# Patient Record
Sex: Female | Born: 1955
Health system: Southern US, Community
[De-identification: ages and names within clinical notes are randomized; demographics above are authoritative.]

## PROBLEM LIST (undated history)

## (undated) DIAGNOSIS — M1990 Unspecified osteoarthritis, unspecified site: Secondary | ICD-10-CM

## (undated) DIAGNOSIS — G8929 Other chronic pain: Secondary | ICD-10-CM

## (undated) DIAGNOSIS — R55 Syncope and collapse: Secondary | ICD-10-CM

## (undated) DIAGNOSIS — E785 Hyperlipidemia, unspecified: Secondary | ICD-10-CM

## (undated) DIAGNOSIS — E039 Hypothyroidism, unspecified: Secondary | ICD-10-CM

## (undated) DIAGNOSIS — I2510 Atherosclerotic heart disease of native coronary artery without angina pectoris: Secondary | ICD-10-CM

## (undated) DIAGNOSIS — F419 Anxiety disorder, unspecified: Secondary | ICD-10-CM

## (undated) DIAGNOSIS — I10 Essential (primary) hypertension: Secondary | ICD-10-CM

## (undated) DIAGNOSIS — R0600 Dyspnea, unspecified: Secondary | ICD-10-CM

## (undated) DIAGNOSIS — Z923 Personal history of irradiation: Secondary | ICD-10-CM

## (undated) DIAGNOSIS — F32A Depression, unspecified: Secondary | ICD-10-CM

## (undated) DIAGNOSIS — C801 Malignant (primary) neoplasm, unspecified: Secondary | ICD-10-CM

## (undated) DIAGNOSIS — M349 Systemic sclerosis, unspecified: Secondary | ICD-10-CM

## (undated) DIAGNOSIS — J302 Other seasonal allergic rhinitis: Secondary | ICD-10-CM

## (undated) DIAGNOSIS — M549 Dorsalgia, unspecified: Secondary | ICD-10-CM

## (undated) DIAGNOSIS — Z720 Tobacco use: Secondary | ICD-10-CM

## (undated) DIAGNOSIS — K219 Gastro-esophageal reflux disease without esophagitis: Secondary | ICD-10-CM

## (undated) DIAGNOSIS — J984 Other disorders of lung: Secondary | ICD-10-CM

## (undated) DIAGNOSIS — F329 Major depressive disorder, single episode, unspecified: Secondary | ICD-10-CM

## (undated) HISTORY — DX: Unspecified osteoarthritis, unspecified site: M19.90

## (undated) HISTORY — DX: Gastro-esophageal reflux disease without esophagitis: K21.9

## (undated) HISTORY — DX: Tobacco use: Z72.0

## (undated) HISTORY — DX: Atherosclerotic heart disease of native coronary artery without angina pectoris: I25.10

## (undated) HISTORY — DX: Systemic sclerosis, unspecified: M34.9

## (undated) HISTORY — DX: Other seasonal allergic rhinitis: J30.2

## (undated) HISTORY — DX: Other chronic pain: G89.29

## (undated) HISTORY — DX: Other disorders of lung: J98.4

## (undated) HISTORY — DX: Dorsalgia, unspecified: M54.9

## (undated) HISTORY — DX: Syncope and collapse: R55

## (undated) HISTORY — DX: Hypothyroidism, unspecified: E03.9

## (undated) HISTORY — PX: TUBAL LIGATION: SHX77

---

## 2000-08-24 ENCOUNTER — Encounter: Payer: Self-pay | Admitting: Family Medicine

## 2000-08-24 ENCOUNTER — Ambulatory Visit (HOSPITAL_COMMUNITY): Admission: RE | Admit: 2000-08-24 | Discharge: 2000-08-24 | Payer: Self-pay | Admitting: Family Medicine

## 2000-09-15 ENCOUNTER — Ambulatory Visit (HOSPITAL_COMMUNITY): Admission: RE | Admit: 2000-09-15 | Discharge: 2000-09-15 | Payer: Self-pay | Admitting: Unknown Physician Specialty

## 2000-09-15 ENCOUNTER — Encounter: Payer: Self-pay | Admitting: Unknown Physician Specialty

## 2002-05-15 ENCOUNTER — Ambulatory Visit (HOSPITAL_COMMUNITY): Admission: RE | Admit: 2002-05-15 | Discharge: 2002-05-15 | Payer: Self-pay | Admitting: Emergency Medicine

## 2002-09-24 ENCOUNTER — Ambulatory Visit (HOSPITAL_COMMUNITY): Admission: RE | Admit: 2002-09-24 | Discharge: 2002-09-24 | Payer: Self-pay | Admitting: Unknown Physician Specialty

## 2006-07-17 ENCOUNTER — Ambulatory Visit (HOSPITAL_COMMUNITY): Admission: RE | Admit: 2006-07-17 | Discharge: 2006-07-17 | Payer: Self-pay | Admitting: Family Medicine

## 2007-07-12 ENCOUNTER — Ambulatory Visit (HOSPITAL_COMMUNITY): Admission: RE | Admit: 2007-07-12 | Discharge: 2007-07-12 | Payer: Self-pay | Admitting: Rheumatology

## 2007-07-26 ENCOUNTER — Ambulatory Visit (HOSPITAL_COMMUNITY): Admission: RE | Admit: 2007-07-26 | Discharge: 2007-07-26 | Payer: Self-pay | Admitting: Family Medicine

## 2008-08-20 ENCOUNTER — Ambulatory Visit (HOSPITAL_COMMUNITY): Admission: RE | Admit: 2008-08-20 | Discharge: 2008-08-20 | Payer: Self-pay | Admitting: Family Medicine

## 2008-11-06 ENCOUNTER — Emergency Department (HOSPITAL_COMMUNITY): Admission: EM | Admit: 2008-11-06 | Discharge: 2008-11-06 | Payer: Self-pay | Admitting: Emergency Medicine

## 2009-03-04 ENCOUNTER — Ambulatory Visit: Payer: Self-pay | Admitting: Family Medicine

## 2009-03-04 ENCOUNTER — Encounter: Payer: Self-pay | Admitting: Physician Assistant

## 2009-03-04 DIAGNOSIS — J309 Allergic rhinitis, unspecified: Secondary | ICD-10-CM | POA: Insufficient documentation

## 2009-03-04 DIAGNOSIS — R1011 Right upper quadrant pain: Secondary | ICD-10-CM

## 2009-03-04 DIAGNOSIS — G4700 Insomnia, unspecified: Secondary | ICD-10-CM | POA: Insufficient documentation

## 2009-03-09 ENCOUNTER — Encounter: Payer: Self-pay | Admitting: Physician Assistant

## 2009-03-18 ENCOUNTER — Ambulatory Visit: Payer: Self-pay | Admitting: Family Medicine

## 2009-03-18 DIAGNOSIS — R490 Dysphonia: Secondary | ICD-10-CM

## 2009-03-18 DIAGNOSIS — E559 Vitamin D deficiency, unspecified: Secondary | ICD-10-CM

## 2009-03-18 DIAGNOSIS — L600 Ingrowing nail: Secondary | ICD-10-CM | POA: Insufficient documentation

## 2009-03-18 DIAGNOSIS — B351 Tinea unguium: Secondary | ICD-10-CM | POA: Insufficient documentation

## 2009-03-18 DIAGNOSIS — E785 Hyperlipidemia, unspecified: Secondary | ICD-10-CM | POA: Insufficient documentation

## 2009-03-24 ENCOUNTER — Ambulatory Visit (HOSPITAL_COMMUNITY): Admission: RE | Admit: 2009-03-24 | Discharge: 2009-03-24 | Payer: Self-pay | Admitting: Family Medicine

## 2009-03-25 LAB — CONVERTED CEMR LAB
ALT: 38 units/L — ABNORMAL HIGH (ref 0–35)
Albumin: 4.6 g/dL (ref 3.5–5.2)
Alkaline Phosphatase: 103 units/L (ref 39–117)
Basophils Absolute: 0 10*3/uL (ref 0.0–0.1)
CO2: 22 meq/L (ref 19–32)
Chloride: 104 meq/L (ref 96–112)
Creatinine, Ser: 0.76 mg/dL (ref 0.40–1.20)
Eosinophils Absolute: 0.2 10*3/uL (ref 0.0–0.7)
Eosinophils Relative: 2 % (ref 0–5)
Glucose, Bld: 98 mg/dL (ref 70–99)
Hemoglobin: 12.9 g/dL (ref 12.0–15.0)
Hepatitis B Surface Ag: NEGATIVE
MCHC: 32.1 g/dL (ref 30.0–36.0)
MCV: 79.4 fL (ref 78.0–100.0)
Monocytes Relative: 8 % (ref 3–12)
Neutrophils Relative %: 41 % — ABNORMAL LOW (ref 43–77)
Platelets: 280 10*3/uL (ref 150–400)
Potassium: 4 meq/L (ref 3.5–5.3)
RBC: 5.06 M/uL (ref 3.87–5.11)
Sodium: 140 meq/L (ref 135–145)
Total CHOL/HDL Ratio: 5
Total Protein: 7.7 g/dL (ref 6.0–8.3)
VLDL: 62 mg/dL — ABNORMAL HIGH (ref 0–40)

## 2009-04-02 ENCOUNTER — Encounter: Payer: Self-pay | Admitting: Family Medicine

## 2009-06-24 ENCOUNTER — Ambulatory Visit: Payer: Self-pay | Admitting: Family Medicine

## 2009-06-24 ENCOUNTER — Telehealth: Payer: Self-pay | Admitting: Family Medicine

## 2009-06-24 ENCOUNTER — Other Ambulatory Visit: Admission: RE | Admit: 2009-06-24 | Discharge: 2009-06-24 | Payer: Self-pay | Admitting: Family Medicine

## 2009-06-24 ENCOUNTER — Encounter: Payer: Self-pay | Admitting: Physician Assistant

## 2009-06-24 DIAGNOSIS — M549 Dorsalgia, unspecified: Secondary | ICD-10-CM

## 2009-06-24 DIAGNOSIS — K921 Melena: Secondary | ICD-10-CM | POA: Insufficient documentation

## 2009-06-24 DIAGNOSIS — G8929 Other chronic pain: Secondary | ICD-10-CM

## 2009-06-24 DIAGNOSIS — F329 Major depressive disorder, single episode, unspecified: Secondary | ICD-10-CM

## 2009-06-24 DIAGNOSIS — Z720 Tobacco use: Secondary | ICD-10-CM

## 2009-06-24 DIAGNOSIS — F419 Anxiety disorder, unspecified: Secondary | ICD-10-CM | POA: Insufficient documentation

## 2009-06-24 HISTORY — DX: Tobacco use: Z72.0

## 2009-06-24 LAB — CONVERTED CEMR LAB: OCCULT 1: POSITIVE

## 2009-07-02 ENCOUNTER — Encounter: Payer: Self-pay | Admitting: Physician Assistant

## 2009-07-02 ENCOUNTER — Ambulatory Visit (HOSPITAL_COMMUNITY): Admission: RE | Admit: 2009-07-02 | Discharge: 2009-07-02 | Payer: Self-pay | Admitting: Family Medicine

## 2009-07-03 ENCOUNTER — Telehealth: Payer: Self-pay | Admitting: Physician Assistant

## 2009-07-16 LAB — CONVERTED CEMR LAB
Cholesterol: 152 mg/dL (ref 0–200)
HDL: 45 mg/dL (ref >39)
Triglycerides: 315 mg/dL — ABNORMAL HIGH (ref <150)
VLDL: 63 mg/dL — ABNORMAL HIGH (ref 0–40)

## 2009-07-21 ENCOUNTER — Ambulatory Visit: Payer: Self-pay | Admitting: Internal Medicine

## 2009-07-21 DIAGNOSIS — R197 Diarrhea, unspecified: Secondary | ICD-10-CM

## 2009-08-05 ENCOUNTER — Emergency Department (HOSPITAL_COMMUNITY): Admission: EM | Admit: 2009-08-05 | Discharge: 2009-08-05 | Payer: Self-pay | Admitting: Emergency Medicine

## 2009-08-27 ENCOUNTER — Ambulatory Visit (HOSPITAL_COMMUNITY): Admission: RE | Admit: 2009-08-27 | Discharge: 2009-08-27 | Payer: Self-pay | Admitting: Family Medicine

## 2009-09-15 ENCOUNTER — Encounter: Payer: Self-pay | Admitting: Gastroenterology

## 2009-09-28 ENCOUNTER — Ambulatory Visit (HOSPITAL_COMMUNITY): Admission: RE | Admit: 2009-09-28 | Discharge: 2009-09-28 | Payer: Self-pay | Admitting: Internal Medicine

## 2009-09-28 ENCOUNTER — Telehealth (INDEPENDENT_AMBULATORY_CARE_PROVIDER_SITE_OTHER): Payer: Self-pay

## 2009-09-28 ENCOUNTER — Ambulatory Visit: Payer: Self-pay | Admitting: Internal Medicine

## 2009-09-28 HISTORY — PX: COLONOSCOPY: SHX174

## 2009-09-30 ENCOUNTER — Encounter: Payer: Self-pay | Admitting: Internal Medicine

## 2009-10-07 HISTORY — PX: ESOPHAGOGASTRODUODENOSCOPY: SHX1529

## 2009-10-13 ENCOUNTER — Encounter: Payer: Self-pay | Admitting: Physician Assistant

## 2009-10-13 ENCOUNTER — Ambulatory Visit: Payer: Self-pay | Admitting: Family Medicine

## 2009-10-13 DIAGNOSIS — R42 Dizziness and giddiness: Secondary | ICD-10-CM | POA: Insufficient documentation

## 2009-10-13 DIAGNOSIS — M25519 Pain in unspecified shoulder: Secondary | ICD-10-CM | POA: Insufficient documentation

## 2009-10-13 LAB — CONVERTED CEMR LAB
Free T4: 1.22 ng/dL (ref 0.80–1.80)
TSH: 1.672 microintl units/mL (ref 0.350–4.500)

## 2009-10-14 ENCOUNTER — Encounter: Payer: Self-pay | Admitting: Physician Assistant

## 2009-10-15 ENCOUNTER — Telehealth: Payer: Self-pay | Admitting: Family Medicine

## 2009-10-19 ENCOUNTER — Telehealth: Payer: Self-pay | Admitting: Family Medicine

## 2009-11-02 ENCOUNTER — Ambulatory Visit: Payer: Self-pay | Admitting: Orthopedic Surgery

## 2009-11-02 DIAGNOSIS — M7580 Other shoulder lesions, unspecified shoulder: Secondary | ICD-10-CM

## 2009-11-04 ENCOUNTER — Encounter: Payer: Self-pay | Admitting: Orthopedic Surgery

## 2010-01-14 ENCOUNTER — Ambulatory Visit: Payer: Self-pay | Admitting: Family Medicine

## 2010-01-19 ENCOUNTER — Telehealth (INDEPENDENT_AMBULATORY_CARE_PROVIDER_SITE_OTHER): Payer: Self-pay

## 2010-02-16 NOTE — Letter (Signed)
Summary: History form  History form   Imported By: Jacklynn Ganong 11/04/2009 08:37:15  _____________________________________________________________________  External Attachment:    Type:   Image     Comment:   External Document

## 2010-02-16 NOTE — Assessment & Plan Note (Signed)
Summary: left shoulder pain needs xr/sec hor/sampson/bsf   Vital Signs:  Patient profile:   55 year old female Height:      69 inches Weight:      152 pounds Pulse rate:   82 / minute Resp:     16 per minute  Vitals Entered By: Fuller Canada MD (November 02, 2009 11:58 AM)  Visit Type:  Follow-up Referring Provider:  Lodema Hong Primary Provider:  Lodema Hong  CC:  left shoulder pain.  History of Present Illness: I saw Kelly Douglas in the office today for an initial visit.    She is a 55 years old woman with the complaint of:  left shoulder pain.  No injury.  Xrays today.  Meds: EMR, pt did not list any.  This patient presents to [place] with primarily LEFT shoulder pain and difficulty lifting her arm overhead and difficulty with activities of daily living such as combing her hair.  She also complains of numbness and tingling in her lower extremities and RIGHT upper extremity.  She has been started on gabapentin, wrist brace which she did herself and an anti-inflammatory.  She has some weakness in her LEFT shoulder.  Pain is moderate and occasionally severe.  No trauma.      Allergies (verified): No Known Drug Allergies  Past History:  Past Medical History: Allergic rhinitis Chronic Bronchitis Arthritis Hypothyroidism Syncope - vasovagal per hx Scleroderma GERD Chronic back pain COPD Numbness  Family History: Mother deceased- HTN Father deceased- HTN, stroke 8 brothers- one with downs syndrome, several HTN, thyroid, DM, heart attack 1 sister- HTN, obesity No FH CRC, liver, chronic GI illnesses. One brother had throat cancer FH of Cancer:  Family History of Diabetes Family History Coronary Heart Disease female < 12 Family History of Arthritis  Social History: Disabled. married 22 years 2 grown children Current Smoker half pack a day, trying to quit Alcohol use-yes, one 12 ounce beer most days 3 cups of coffee daily Drug use-no 12th grade ed.  Review of  Systems Constitutional:  Denies weight loss, weight gain, fever, chills, and fatigue. Cardiovascular:  Complains of fainting; denies chest pain, palpitations, and murmurs. Respiratory:  Complains of short of breath; denies wheezing, couch, tightness, pain on inspiration, and snoring . Gastrointestinal:  Complains of heartburn; denies nausea, vomiting, diarrhea, constipation, and blood in your stools. Genitourinary:  Denies frequency, urgency, difficulty urinating, painful urination, flank pain, and bleeding in urine. Neurologic:  Complains of dizziness; denies numbness, tingling, unsteady gait, tremors, and seizure. Musculoskeletal:  Complains of joint pain; denies swelling, instability, stiffness, redness, heat, and muscle pain. Endocrine:  Complains of heat or cold intolerance; denies excessive thirst and exessive urination. Psychiatric:  Complains of nervousness and anxiety; denies depression and hallucinations. Skin:  Complains of changes in the skin and itching; denies poor healing, rash, and redness. HEENT:  Denies blurred or double vision, eye pain, redness, and watering. Immunology:  Complains of seasonal allergies; denies sinus problems and allergic to bee stings. Hemoatologic:  Denies easy bleeding and brusing.  Physical Exam  Additional Exam:  GEN: well developed, well nourished, normal grooming and hygiene, no deformity and smalll body habitus.  VS normal  CDV: pulses are normal, no edema, no erythema. no tenderness  Lymph: normal lymph nodes   Skin: no rashes,  or open sores. there is old there are multiple areas of skin changes which she relates to her arthritis.  NEURO: normal coordination, reflexes, sensation.   Psyche: awake, alert and oriented. Mood normal   cervical  spine mildly tender in the midline at C5 and 6.  RIGHT shoulder inspection normal, range of motion full, motor exam grade 5, stability normal.  Impingement sign negative  LEFT shoulder inspection  tenderness in the posterior subacromial space, range of motion passively full, strength normal grade 5.  Stability normal.  Impingement sign positive.  Adduction across the body normal.  A.c. joint nontender.     Impression & Recommendations:  Problem # 1:  SHOULDER IMPINGEMENT SYNDROME, LEFT (ICD-726.2) Assessment New  x-rays LEFT shoulder, AP and lateral. The glenohumeral joint is aligned normally. The medial contour of the humerus and lateral border of the scapula, have a nice, smooth line, and the acromion is type II. There no space-occupying lesions in the lung field on the LEFT.  Impression normal LEFT shoulder.  LEFT subacromial injection Verbal consent obtained/The shoulder was injected with depomedrol 40mg /cc and sensorcaine .25% . There were no complications  Orders: New Patient Level III (81191) Shoulder x-ray,  minimum 2 views (47829) Joint Aspirate / Injection, Large (20610) Depo- Medrol 40mg  (J1030)  Patient Instructions: 1)  You have received an injection of cortisone today. You may experience increased pain at the injection site. Apply ice pack to the area for 20 minutes every 2 hours and take 2 xtra strength tylenol every 8 hours. This increased pain will usually resolve in 24 hours. The injection will take effect in 3-10 days.  2)  Limit activity to comfort and avoid activities that increase discomfort.  Apply moist heat and/or ice to shoulder.  Please read the Shoulder Pain Handout and start exercises as directed. 3)  Please schedule a follow-up appointment as needed.   Orders Added: 1)  New Patient Level III [56213] 2)  Shoulder x-ray,  minimum 2 views [73030] 3)  Joint Aspirate / Injection, Large [20610] 4)  Depo- Medrol 40mg  [J1030]

## 2010-02-16 NOTE — Letter (Signed)
Summary: triage order  triage order   Imported By: Ave Filter 09/15/2009 11:14:24  _____________________________________________________________________  External Attachment:    Type:   Image     Comment:   External Document  Appended Document: triage order Fine as is. Just make sure order is for TCS +/- EGD (NOT TCS/EGD).  Appended Document: triage order Correction made.

## 2010-02-16 NOTE — Progress Notes (Signed)
Summary: mammo at [facility]  Phone Note Call from Patient   Summary of Call: pt has appt at [facility] for mammo on 08/24/2009 9:00. pt notified  Initial call taken by: Rudene Anda,  June 24, 2009 10:14 AM

## 2010-02-16 NOTE — Progress Notes (Signed)
  Phone Note Outgoing Call   Summary of Call: Pls notify pt that I have recieved her breathing test results.  She has COPD and this is why she has coughing, difficulty breathing and wheezing.  I have prescribed an inhaler for her to use once daily called [employer]. She needs to ask the pharmacist to show her how to use it.  It is very important she stop smoking to prevent this from worsening. Initial call taken by: Esperanza Sheets PA,  July 03, 2009 10:16 AM  Follow-up for Phone Call        called patient, no answer Follow-up by: Adella Hare LPN,  July 03, 2009 3:25 PM  Additional Follow-up for Phone Call Additional follow up Details #1::        patient aware and will mail some info on copd per patient request Additional Follow-up by: Adella Hare LPN,  July 06, 2009 10:42 AM    New/Updated Medications: SPIRIVA HANDIHALER 18 MCG CAPS (TIOTROPIUM BROMIDE MONOHYDRATE) use 1 inhalation once daily Prescriptions: SPIRIVA HANDIHALER 18 MCG CAPS (TIOTROPIUM BROMIDE MONOHYDRATE) use 1 inhalation once daily  #1 mos x 2   Entered and Authorized by:   Esperanza Sheets PA   Signed by:   Esperanza Sheets PA on 07/03/2009   Method used:   Electronically to        [facility] [phone]* (retail)       [address], [place]  [address]       Ph: [phone]       Fax: [phone]   RxID:   [id number]

## 2010-02-16 NOTE — Assessment & Plan Note (Signed)
Summary: lightheaded spells- room 1   Vital Signs:  Patient profile:   55 year old female Height:      67 inches Weight:      150.75 pounds BMI:     23.70 O2 Sat:      100 % on Room air Pulse (ortho):   85 / minute Resp:     16 per minute BP standing:   122 / 80  Vitals Entered By: Adella Hare LPN (October 13, 2009 9:28 AM) CC: lightheaded with pain in head upon standing Is Patient Diabetic? No Pain Assessment Patient in pain? no        Serial Vital Signs/Assessments:  Time      Position  BP       Pulse  Resp  Temp     By           Lying LA  138/82   83                    Adella Hare LPN           Sitting   130/90   84                    Adella Hare LPN           Standing  122/80   85                    Adella Hare LPN   Referring Provider:  Lodema Hong Primary Provider:  Lodema Hong  CC:  lightheaded with pain in head upon standing.  History of Present Illness: Pt states that her girlfriends dad passed away and she is feeling very stressed.  She is having episodes of feeling very anxious and stressed, lightheaded with change of positions, stomach upset.  She would like a prescription to help her through this stressful time.  Had used Xanax in the past, with previous physician.  Pt states she feels tired when she takes her thyroid pills every day, but if she stops them for a week she then feels better. Has been taking them every day x 2 mos.  Last thyroid labs done 2/11and were wnl.  Back pain is doing much better.  Had injections with Dr Eduard Clos and has helped alot.  Needs a refill of Neurontin.  Lt shoulder pain x > 1 yr.  No injury.  Current Medications (verified): 1)  Neurontin 300 Mg Caps (Gabapentin) .... One Cap By Mouth Two Times A Day 2)  Pantoprazole Sodium 40 Mg Tbec (Pantoprazole Sodium) .... One Tab By Mouth Once Daily 3)  Levothyroxine Sodium 75 Mcg Tabs (Levothyroxine Sodium) .... One Tab By Mouth Once Daily 4)  Ambien 10 Mg Tabs (Zolpidem Tartrate) .Marland Kitchen..  1 At Bedtime As Needed For Insomnia 5)  Multivitamins  Tabs (Multiple Vitamin) .... Take 1 Tablet By Mouth Once A Day 6)  Sertraline Hcl 100 Mg Tabs (Sertraline Hcl) .... Take 1/2 Tablet For 10 Days Then Increase To 1 Tablet Daily 7)  Spiriva Handihaler 18 Mcg Caps (Tiotropium Bromide Monohydrate) .... Use 1 Inhalation Once Daily 8)  Vitamin D .... One Tablet Daily  Allergies (verified): No Known Drug Allergies  Past History:  Past medical history reviewed for relevance to current acute and chronic problems.  Past Medical History: Reviewed history from 07/21/2009 and no changes required. Allergic rhinitis Chronic Bronchitis Arthritis Hypothyroidism Syncope - vasovagal per hx Scleroderma GERD Chronic back pain  Review of Systems General:  Denies chills and fever. ENT:  Denies earache, nasal congestion, and sore throat. CV:  Complains of lightheadness; denies chest pain or discomfort and palpitations. Resp:  Denies cough and shortness of breath. GI:  Complains of loss of appetite and nausea; denies abdominal pain, change in bowel habits, and vomiting. Neuro:  Denies headaches. Psych:  Complains of anxiety and easily tearful; denies depression.  Physical Exam  General:  Well-developed,well-nourished,in no acute distress; alert,appropriate and cooperative throughout examination Head:  Normocephalic and atraumatic without obvious abnormalities. No apparent alopecia or balding. Ears:  External ear exam shows no significant lesions or deformities.  Otoscopic examination reveals clear canals, tympanic membranes are intact bilaterally without bulging, retraction, inflammation or discharge. Hearing is grossly normal bilaterally. Nose:  External nasal examination shows no deformity or inflammation. Nasal mucosa are pink and moist without lesions or exudates. Mouth:  Oral mucosa and oropharynx without lesions or exudates.  poor dentition and teeth missing.   Neck:  No deformities, masses,  or tenderness noted. Lungs:  Normal respiratory effort, chest expands symmetrically. Lungs are clear to auscultation, no crackles or wheezes. Heart:  Normal rate and regular rhythm. S1 and S2 normal without gallop, murmur, click, rub or other extra sounds. Msk:  LT shoulder:  normal ROM, no joint swelling, no joint instability, and no crepitation.  TTP AC joint and posterior joint line of shoulder. Pulses:  R radial normal and L radial normal.   Neurologic:  alert & oriented X3, sensation intact to light touch, gait normal, and DTRs symmetrical and normal.   Cervical Nodes:  No lymphadenopathy noted Psych:  Cognition and judgment appear intact. Alert and cooperative with normal attention span and concentration. No apparent delusions, illusions, hallucinations   Impression & Recommendations:  Problem # 1:  SHOULDER PAIN, LEFT (ICD-719.41) Assessment New  Orders: Orthopedic Referral (Ortho)  Problem # 2:  BACK PAIN, LUMBAR, CHRONIC (ICD-724.2) Assessment: Improved  Problem # 3:  ANXIETY DISORDER (ICD-300.00) Assessment: Deteriorated  Her updated medication list for this problem includes:    Sertraline Hcl 100 Mg Tabs (Sertraline hcl) .Marland Kitchen... Take 1/2 tablet for 10 days then increase to 1 tablet daily    Alprazolam 0.25 Mg Tabs (Alprazolam) .Marland Kitchen... Take 1 tab every 8 hrs as needed for anxiety  Problem # 4:  NICOTINE ADDICTION (ICD-305.1) Assessment: Comment Only  Encouraged smoking cessation and discussed different methods for smoking cessation.   Complete Medication List: 1)  Gabapentin 300 Mg Caps (Gabapentin) .... Take 1 three times a day 2)  Pantoprazole Sodium 40 Mg Tbec (Pantoprazole sodium) .... One tab by mouth once daily 3)  Levothyroxine Sodium 75 Mcg Tabs (Levothyroxine sodium) .... One tab by mouth once daily 4)  Ambien 10 Mg Tabs (Zolpidem tartrate) .Marland Kitchen.. 1 at bedtime as needed for insomnia 5)  Multivitamins Tabs (Multiple vitamin) .... Take 1 tablet by mouth once a day 6)   Sertraline Hcl 100 Mg Tabs (Sertraline hcl) .... Take 1/2 tablet for 10 days then increase to 1 tablet daily 7)  Spiriva Handihaler 18 Mcg Caps (Tiotropium bromide monohydrate) .... Use 1 inhalation once daily 8)  Vitamin D  .... One tablet daily 9)  Alprazolam 0.25 Mg Tabs (Alprazolam) .... Take 1 tab every 8 hrs as needed for anxiety  Other Orders: EKG w/ Interpretation (93000) Influenza Vaccine NON MCR (11914) T-TSH ([address]130) T-T4, Free (86578-46962) EKG w/ Interpretation (93000)  Patient Instructions: 1)  Please schedule a follow-up appointment in 3 months. 2)  Tobacco is very bad for your  health and your loved ones! You Should stop smoking!. 3)  Stop Smoking Tips: Choose a Quit date. Cut down before the Quit date. decide what you will do as a substitute when you feel the urge to smoke(gum,toothpick,exercise). 4)  I have referred you to an orthopedic dr for your shoulder. 5)  I have presribed you a few nerve pills.  This is for short term use only. 6)  I have refilled your Neurontin. 7)  Have your thyroid labs drawn today. Prescriptions: ALPRAZOLAM 0.25 MG TABS (ALPRAZOLAM) take 1 tab every 8 hrs as needed for anxiety  #10 x 0   Entered and Authorized by:   Esperanza Sheets PA   Signed by:   Esperanza Sheets PA on 10/13/2009   Method used:   Printed then faxed to ...       [facility] [address] [phone]* (retail)       [address], [place]  [address]       Ph: [phone]       Fax: [phone]   RxID:   [id number] GABAPENTIN 300 MG CAPS (GABAPENTIN) take 1 three times a day  #300 x 1   Entered and Authorized by:   Esperanza Sheets PA   Signed by:   Esperanza Sheets PA on 10/13/2009   Method used:   Electronically to        [facility]. [address] [phone]* (retail)       [address] [place], [place]  [address]       Ph: [phone]       Fax: [phone]   RxID:   [id number]    Influenza Vaccine    Vaccine Type: Fluvax Non-MCR    Site: right deltoid     Mfr: novartis    Dose: 0.5 ml    Route: IM    Given by: Adella Hare LPN    Exp. Date: 05/2010    Lot #: [id number]    VIS given: 08/11/09 version given October 13, 2009.

## 2010-02-16 NOTE — Letter (Signed)
Summary: ENCOUNTER FORM 07/21/2009  ENCOUNTER FORM 07/21/2009   Imported By: Ave Filter 09/15/2009 11:41:14  _____________________________________________________________________  External Attachment:    Type:   Image     Comment:   External Document

## 2010-02-16 NOTE — Progress Notes (Signed)
Summary: referral  Phone Note Call from Patient   Summary of Call: pt has appt with dr. Diamantina [place] office for 11/02/2009 11:30. pt notified by dr. Diamantina [place] office. Initial call taken by: Rudene Anda,  October 15, 2009 9:03 AM

## 2010-02-16 NOTE — Assessment & Plan Note (Signed)
Summary: Hoarseness room 1   Vital Signs:  Patient profile:   55 year old female Height:      67 inches Weight:      160.13 pounds BMI:     25.17 O2 Sat:      91 % Pulse rate:   89 / minute Resp:     16 per minute BP sitting:   122 / 82  (left arm) Cuff size:   regular  Vitals Entered By: Everitt Amber LPN (March 18, 1189 8:16 AM) CC: Still having hoarseness and her big right toe is hurting her   CC:  Still having hoarseness and her big right toe is hurting her.  History of Present Illness: Pt c/o hoarse voice x > 3 wks.  She denies nasal congestion or post nasal drainage.  She is concerned because she had a brother who had cancer.  She is a smoker.  She does have some cough, but states that this has not changed at all.    Pt also c/o pain in her Rt great toe.  The toenail is pushing into her toe causing her pain.  No redness or drainage.   Pt also brought paperwork today re: Scleroderma.  She states she was seeing a Rheumatolgist for this a few yrs ago in [place].  Stopped going when she didn't have ins.  They had her on Celebrex for arthritis too but it caused her skin to darken.  Pt has been taking Ambien as needed.  This was prescribed in place of Doxepin.  She liked the Doxepin better but will continue trying the Ambien at this time.     Current Medications (verified): 1)  Neurontin 300 Mg Caps (Gabapentin) .... One Cap By Mouth Three Times A Day 2)  Pantoprazole Sodium 40 Mg Tbec (Pantoprazole Sodium) .... One Tab By Mouth Once Daily 3)  Levothyroxine Sodium 75 Mcg Tabs (Levothyroxine Sodium) .... One Tab By Mouth Once Daily 4)  Ventolin Hfa 108 (90 Base) Mcg/act Aers (Albuterol Sulfate) .... Uad 5)  Arthritis Pain Relief 650 Mg Cr-Tabs (Acetaminophen) .... As Needed 6)  Ambien 10 Mg Tabs (Zolpidem Tartrate) .Marland Kitchen.. 1 At Bedtime As Needed For Insomnia 7)  Celebrex 100 Mg Caps (Celecoxib) .... Take 1 Tablet By Mouth Two Times A Day 8)  Multivitamins  Tabs (Multiple Vitamin)  .... Take 1 Tablet By Mouth Once A Day  Allergies (verified): No Known Drug Allergies  Past History:  Past medical, surgical, family and social histories (including risk factors) reviewed, and no changes noted (except as noted below).  Past Medical History: Allergic rhinitis Chronic Bronchitis Arthritis Hypothyroidism Syncope - vasovagal per hx Scleroderma  Past Surgical History: Reviewed history from 03/04/2009 and no changes required. Tubal ligation  Family History: Reviewed history from 03/04/2009 and no changes required. Mother deceased- HTN Father deceased- HTN, stroke 8 brothers- one with downs syndrome, several HTN, thyroid, DM, heart attack 1 sister- HTN, obesity  Social History: Reviewed history from 03/04/2009 and no changes required. unemployed married 22 years 2 grown children Current Smoker half pack a day Alcohol use-yes occasional Drug use-no  Review of Systems General:  Denies chills and fever. ENT:  Complains of hoarseness; denies earache, nasal congestion, postnasal drainage, sinus pressure, and sore throat. CV:  Denies chest pain or discomfort. Resp:  Complains of cough; denies shortness of breath and sputum productive. MS:  Complains of joint pain and stiffness; denies joint swelling. Derm:  Complains of changes in color of skin. Heme:  Denies enlarge lymph nodes and fevers. Allergy:  Denies sneezing.  Physical Exam  General:  Well-developed,well-nourished,in no acute distress; alert,appropriate and cooperative throughout examination Head:  Normocephalic and atraumatic without obvious abnormalities. No apparent alopecia or balding. Ears:  External ear exam shows no significant lesions or deformities.  Otoscopic examination reveals clear canals, tympanic membranes are intact bilaterally without bulging, retraction, inflammation or discharge. Hearing is grossly normal bilaterally. Nose:  External nasal examination shows no deformity or  inflammation. Nasal mucosa are pink and moist without lesions or exudates. Mouth:  Oral mucosa and oropharynx without lesions or exudates.  Teeth in good repair. Neck:  No deformities, masses, or tenderness noted.no thyromegaly.   Lungs:  Normal respiratory effort, chest expands symmetrically. Lungs are clear to auscultation, no crackles or wheezes. Heart:  Normal rate and regular rhythm. S1 and S2 normal without gallop, murmur, click, rub or other extra sounds. Extremities:  No clubbing, cyanosis, edema, or deformity noted with normal full range of motion of all joints.   Skin:  patches of hypopigmentation noted. Rt great toenail curves inward.  Thickened yellow 1st & 2nd toenails noted on Rt foot. Cervical Nodes:  No lymphadenopathy noted Psych:  Cognition and judgment appear intact. Alert and cooperative with normal attention span and concentration. No apparent delusions, illusions, hallucinations   Impression & Recommendations:  Problem # 1:  HOARSENESS (WJX-914.78) Assessment New  Will refer pt to ENT for further eval.  Orders: ENT Referral (ENT)  Problem # 2:  SCLERODERMA (ICD-710.1) Assessment: Unchanged Pt will sched appt with Rheumatologist she was seeing for this.  Discussed with pt that this needs to be managed by the Rheumatologist.  Problem # 3:  INGROWN TOENAIL (ICD-703.0) Assessment: New  Will refer pt to podiatrist for removal.  Orders: Podiatry Referral (Podiatry)  Problem # 4:  ONYCHOMYCOSIS, TOENAILS (ICD-110.1) Assessment: New  Orders: Podiatry Referral (Podiatry)  Problem # 5:  VITAMIN D DEFICIENCY (ICD-268.9) Assessment: New Pt to start Vit D 1,000 international units once daily.   Problem # 6:  HEPATOMEGALY (ICD-789.1) Assessment: Unchanged Discussed recent lab test results. Pt has appt for ultrasound next wk.  Problem # 7:  HYPERTRIGLYCERIDEMIA (ICD-272.1) Assessment: New Awaiting abd [place] results before further discussion, recommendations re:  this.  Complete Medication List: 1)  Neurontin 300 Mg Caps (Gabapentin) .... One cap by mouth three times a day 2)  Pantoprazole Sodium 40 Mg Tbec (Pantoprazole sodium) .... One tab by mouth once daily 3)  Levothyroxine Sodium 75 Mcg Tabs (Levothyroxine sodium) .... One tab by mouth once daily 4)  Ventolin Hfa 108 (90 Base) Mcg/act Aers (Albuterol sulfate) .... Uad 5)  Arthritis Pain Relief 650 Mg Cr-tabs (Acetaminophen) .... As needed 6)  Ambien 10 Mg Tabs (Zolpidem tartrate) .Marland Kitchen.. 1 at bedtime as needed for insomnia 7)  Celebrex 100 Mg Caps (Celecoxib) .... Take 1 tablet by mouth two times a day 8)  Multivitamins Tabs (Multiple vitamin) .... Take 1 tablet by mouth once a day  Patient Instructions: 1)  We will refer you to an ENT Dr for your hoarseness, and a Podiatrist for your foot. 2)  Please call the Rheumatologist in [place] you saw before for an appt regarding your scleroderma. 3)  Start Vitamin D 1,000 international units once daily.  You can buy this in the vitamin section of the store. 4)  Please schedule a follow-up appointment in 3 months.  Pap/Pelvic. 5)  We are still waiting for the rest of your test results for your liver.  6)  Tobacco is very bad for your health and your loved ones! You Should stop smoking!. 7)  Stop Smoking Tips: Choose a Quit date. Cut down before the Quit date. decide what you will do as a substitute when you feel the urge to smoke(gum,toothpick,exercise).

## 2010-02-16 NOTE — Assessment & Plan Note (Signed)
Summary: physical- room 1   Vital Signs:  Patient profile:   55 year old female Height:      67 inches Weight:      156.25 pounds BMI:     24.56 O2 Sat:      98 % on Room air Pulse rate:   87 / minute Resp:     16 per minute BP sitting:   120 / 70  (left arm)  Vitals Entered By: Adella Hare LPN (June 24, 4096 8:23 AM) CC: physical Is Patient Diabetic? No Pain Assessment Patient in pain? no      Comments did not bring meds to ov   CC:  physical.  History of Present Illness: pt is here today for a physical/pap.  She recently saw ENT re: chronic hoarseness.  Rx's Flonase NS but pt not currently using. States she does have allergies, itchy burning eyes, sneezing and nasal congestion.  Hx insomnia.  Ambien is working well for her. Hx of arthritis.  Chronic pain in Rt hip.  Pt states she had MRI in the past.  She discontinued Celebrex because she states it made her skin turn white.  Hx of scleroderma.  See's last saw rheumatologist approx 2 yrs ago (Dr Kellie Simmering). Hx of GERD.  Taking Protonix & works well.  Sxs come back if runs out.  Last Mamm June 2010. + SBE Last physical/pap June 2010.  No prev abn pap. No prev colonoscopy. Eye exam 1 yr ago. No dental care. Last tetnus approx 10 yrs ago.       Allergies (verified): No Known Drug Allergies  Past History:  Past medical, surgical, family and social histories (including risk factors) reviewed for relevance to current acute and chronic problems.  Past Medical History: Reviewed history from 03/18/2009 and no changes required. Allergic rhinitis Chronic Bronchitis Arthritis Hypothyroidism Syncope - vasovagal per hx Scleroderma  Past Surgical History: Reviewed history from 03/04/2009 and no changes required. Tubal ligation  Family History: Reviewed history from 03/04/2009 and no changes required. Mother deceased- HTN Father deceased- HTN, stroke 8 brothers- one with downs syndrome, several HTN, thyroid,  DM, heart attack 1 sister- HTN, obesity  Social History: Reviewed history from 03/04/2009 and no changes required. unemployed married 22 years 2 grown children Current Smoker half pack a day Alcohol use-yes occasional Drug use-no  Review of Systems General:  Complains of loss of appetite; denies chills and fever. Eyes:  Denies blurring and double vision. ENT:  Complains of nasal congestion; denies decreased hearing, earache, ringing in ears, and sore throat. CV:  Complains of fainting; denies chest pain or discomfort and palpitations; HX OF SYNCOPAL EPISODES SINCE A CHILD. Resp:  Complains of cough, shortness of breath, and wheezing. GI:  Complains of abdominal pain, indigestion, and loss of appetite; denies bloody stools, change in bowel habits, constipation, dark tarry stools, diarrhea, nausea, and vomiting; CHRONIC RUQ. GU:  Denies abnormal vaginal bleeding, discharge, dysuria, incontinence, and urinary frequency. MS:  Complains of joint pain and low back pain. Derm:  Complains of changes in color of skin. Neuro:  Complains of numbness; denies headaches and tingling. Psych:  Complains of anxiety; denies depression. Allergy:  Complains of itching eyes, seasonal allergies, and sneezing.  Physical Exam  General:  Well-developed,well-nourished,in no acute distress; alert,appropriate and cooperative throughout examination Head:  Normocephalic and atraumatic without obvious abnormalities. No apparent alopecia or balding. Eyes:  No corneal or conjunctival inflammation noted. EOMI. Perrla. Funduscopic exam benign, without hemorrhages, exudates or papilledema. Vision  grossly normal. Ears:  External ear exam shows no significant lesions or deformities.  Otoscopic examination reveals clear canals, tympanic membranes are intact bilaterally without bulging, retraction, inflammation or discharge. Hearing is grossly normal bilaterally. Nose:  External nasal examination shows no deformity or  inflammation. Nasal mucosa are pink and moist without lesions or exudates. Mouth:  Oral mucosa and oropharynx without lesions or exudates. poor dentition.   Neck:  No deformities, masses, or tenderness noted.no thyromegaly, no thyroid nodules or tenderness, and no carotid bruits.   Chest Wall:  No deformities, masses, or tenderness noted. Breasts:  No mass, nodules, thickening, tenderness, bulging, retraction, inflamation, nipple discharge or skin changes noted.   Lungs:  Normal respiratory effort, chest expands symmetrically. Lungs are clear to auscultation, no crackles or wheezes. Heart:  Normal rate and regular rhythm. S1 and S2 normal without gallop, murmur, click, rub or other extra sounds. Abdomen:  Bowel sounds positive,abdomen soft and non-tender without masses, organomegaly or hernias noted. Rectal:  No external abnormalities noted. Normal sphincter tone. No rectal masses or tenderness. Genitalia:  Normal introitus for age, no external lesions, no vaginal discharge, mucosa pink and moist, no vaginal or cervical lesions, no vaginal atrophy, no friaility or hemorrhage, normal uterus size and position, no adnexal masses or tenderness Msk:  LS Spine pt reports TTP R L3 - L5.  Nontender SI and sciatic notch. R hip nontender to palp. Pulses:  R posterior tibial normal, R dorsalis pedis normal, R carotid normal, L posterior tibial normal, L dorsalis pedis normal, and L carotid normal.   Extremities:  No PTE Neurologic:  alert & oriented X3, strength normal in all extremities, sensation intact to light touch, gait normal, and DTRs symmetrical and normal.   Skin:  vitiligo.   Cervical Nodes:  No lymphadenopathy noted Axillary Nodes:  No palpable lymphadenopathy Psych:  Oriented X3, normally interactive, good eye contact, not anxious appearing, and not depressed appearing.     Impression & Recommendations:  Problem # 1:  Preventive Health Care (ICD-V70.0) Encouraged routine eye exams, and  dental care. Encouraged pt to sched f/u with Dr Kellie Simmering.  Problem # 2:  HYPOTHYROIDISM (ICD-244.9) Assessment: Comment Only  Her updated medication list for this problem includes:    Levothyroxine Sodium 75 Mcg Tabs (Levothyroxine sodium) ..... One tab by mouth once daily  Labs Reviewed: TSH: 1.236 (03/09/2009)    Chol: 166 (03/09/2009)   HDL: 33 (03/09/2009)   LDL: 71 (03/09/2009)   TG: 311 (03/09/2009)  Problem # 3:  BACK PAIN, LUMBAR, CHRONIC (ICD-724.2) Assessment: Comment Only  The following medications were removed from the medication list:    Celebrex 100 Mg Caps (Celecoxib) .Marland Kitchen... Take 1 tablet by mouth two times a day Her updated medication list for this problem includes:    Arthritis Pain Relief 650 Mg Cr-tabs (Acetaminophen) .Marland Kitchen... As needed    Tramadol Hcl 50 Mg Tabs (Tramadol hcl) .Marland Kitchen... Take 1 tablet every 6 hrs as needed for back pain  Orders: Pain Clinic Referral (Pain)  Problem # 4:  HYPERTRIGLYCERIDEMIA (ICD-272.1) Encouraged healthy diet.  Orders: T-Lipid Profile [phone])  Labs Reviewed: SGOT: 32 (03/09/2009)   SGPT: 38 (03/09/2009)   HDL:33 (03/09/2009)  LDL:71 (03/09/2009)  Chol:166 (03/09/2009)  Trig:311 (03/09/2009)  Problem # 5:  HEMOCCULT POSITIVE STOOL (ICD-578.1) Assessment: New  Orders: Gastroenterology Referral (GI) Hemoccult Guaiac-1 spec.(in office) (82270)  Problem # 6:  ANXIETY DISORDER (ICD-300.00) Assessment: Unchanged  Her updated medication list for this problem includes:    Sertraline Hcl  100 Mg Tabs (Sertraline hcl) .Marland Kitchen... Take 1/2 tablet for 10 days then increase to 1 tablet daily  Problem # 7:  NICOTINE ADDICTION (ICD-305.1)  Encouraged smoking cessation and discussed different methods for smoking cessation.  Pt has Chantix prescription at pharmacy.  Encouraged her to try it.  Orders: Misc. Referral (Misc. Ref)  Complete Medication List: 1)  Neurontin 300 Mg Caps (Gabapentin) .... One cap by mouth two times a day 2)   Pantoprazole Sodium 40 Mg Tbec (Pantoprazole sodium) .... One tab by mouth once daily 3)  Levothyroxine Sodium 75 Mcg Tabs (Levothyroxine sodium) .... One tab by mouth once daily 4)  Arthritis Pain Relief 650 Mg Cr-tabs (Acetaminophen) .... As needed 5)  Ambien 10 Mg Tabs (Zolpidem tartrate) .Marland Kitchen.. 1 at bedtime as needed for insomnia 6)  Multivitamins Tabs (Multiple vitamin) .... Take 1 tablet by mouth once a day 7)  Sertraline Hcl 100 Mg Tabs (Sertraline hcl) .... Take 1/2 tablet for 10 days then increase to 1 tablet daily 8)  Tramadol Hcl 50 Mg Tabs (Tramadol hcl) .... Take 1 tablet every 6 hrs as needed for back pain  Other Orders: T-Mammography Bilateral Screening (21308) T-Vitamin B12 (65784-69629) Pap Smear (52841) Tdap => 30yrs IM (32440) Admin 1st Vaccine (10272)  Patient Instructions: 1)  Please schedule a follow-up appointment in 3 months. 2)  I have referred you to a pain dr for your back pain. 3)  I have referred you to the GI dr for a colonoscopy. 4)  Please sched a follow up appt with your eye dr. 5)  I have prescribed a medication for your anxiety.  This may help with your hot flashes too. 6)  I have refilled your other medications. 7)  Tobacco is very bad for your health and your loved ones! You Should stop smoking!. 8)  Stop Smoking Tips: Choose a Quit date. Cut down before the Quit date. decide what you will do as a substitute when you feel the urge to smoke(gum,toothpick,exercise). 9)  It is important that you exercise regularly at least 20 minutes 5 times a week. If you develop chest pain, have severe difficulty breathing, or feel very tired , stop exercising immediately and seek medical attention. Prescriptions: TRAMADOL HCL 50 MG TABS (TRAMADOL HCL) take 1 tablet every 6 hrs as needed for back pain  #30 x 1   Entered and Authorized by:   Esperanza Sheets PA   Signed by:   Esperanza Sheets PA on 06/24/2009   Method used:   Electronically to        [facility] [address]  [phone]* (retail)       [address], [place]  [address]       Ph: [phone]       Fax: [phone]   RxID:   [id number] SERTRALINE HCL 100 MG TABS (SERTRALINE HCL) take 1/2 tablet for 10 days then increase to 1 tablet daily  #30 x 3   Entered and Authorized by:   Esperanza Sheets PA   Signed by:   Esperanza Sheets PA on 06/24/2009   Method used:   Electronically to        [facility] [phone]* (retail)       [address] [place], [place]  [address]       Ph: [phone]       Fax: [phone]   RxID:   [id number] AMBIEN 10  MG TABS (ZOLPIDEM TARTRATE) 1 at bedtime as needed for insomnia  #30 x 2   Entered and Authorized by:   Esperanza Sheets PA   Signed by:   Esperanza Sheets PA on 06/24/2009   Method used:   Printed then faxed to ...       [facility] [address] [phone]* (retail)       [address], [place]  [address]       Ph: [phone]       Fax: [phone]   RxID:   [id number] LEVOTHYROXINE SODIUM 75 MCG TABS (LEVOTHYROXINE SODIUM) one tab by mouth once daily  #30 x 5   Entered and Authorized by:   Esperanza Sheets PA   Signed by:   Esperanza Sheets PA on 06/24/2009   Method used:   Electronically to        [facility] [phone]* (retail)       [address] [place]  [address]       Ph: [phone]       Fax: [phone]   RxID:   [id number] PANTOPRAZOLE SODIUM 40 MG TBEC (PANTOPRAZOLE SODIUM) one tab by mouth once daily  #30 x 5   Entered and Authorized by:   Esperanza Sheets PA   Signed by:   Esperanza Sheets PA on 06/24/2009   Method used:   Electronically to        [facility] [phone]* (retail)       [address] [place]  [address]       Ph: [phone]       Fax: [phone]   RxID:   [id number]    Immunizations Administered:  Tetanus Vaccine:    Vaccine Type: Tdap    Site: right deltoid    Mfr: GlaxoSmithKline    Dose: 0.5 ml    Route: IM    Given by: Adella Hare LPN     Exp. Date: 04/11/2011    Lot #: [id number]    VIS given: 12/05/06 version given June 24, 2009.   Laboratory Results    Stool - Occult Blood Hemmoccult #1: positive Date: 06/24/2009

## 2010-02-16 NOTE — Progress Notes (Signed)
Summary: ENT  ENT   Imported By: Lind Guest 04/06/2009 11:05:20  _____________________________________________________________________  External Attachment:    Type:   Image     Comment:   External Document

## 2010-02-16 NOTE — Letter (Signed)
Summary: Patient Notice, Endo Biopsy Results  [facility]  [address]   [place], [place] [address]   Phone: [phone]  Fax: [phone]       September 30, 2009   Kelly Douglas [address] [place], [place]  [address] 1955/10/01    Dear Ms. Polanco,  I am pleased to inform you that the biopsies taken during your recent endoscopic examination did not show any evidence of cancer or other abnormality upon pathologic examination.  Additional information/recommendations:  Continue with the treatment plan as outlined on the day of your exam.  Please call us if you are having persistent problems or have questions about your condition that have not been fully answered at this time.  Sincerely,    R. Roetta Sessions MD, FACP [facility] Ph: [phone]   Fax: [phone]   Appended Document: Patient Notice, Endo Biopsy Results letter mailed to pt

## 2010-02-16 NOTE — Progress Notes (Signed)
Summary: dexilant samples  Phone Note Other Incoming   Caller: RMR Summary of Call: RMR called pt needs 3 weeks of Dexilant samples. Samples at front desk Initial call taken by: Hendricks Limes LPN,  September 28, 2009 12:00 PM

## 2010-02-16 NOTE — Progress Notes (Signed)
Summary: needs add on to her ref.  Phone Note Call from Patient   Summary of Call: going to dr. Romeo Apple 10.17.11 and wants you to add on her paper to him about right left ahnd hurts and bottom of her right leg aches a toothache Initial call taken by: Lind Guest,  October 19, 2009 3:34 PM  Follow-up for Phone Call        returned call, left message Follow-up by: Adella Hare LPN,  October 19, 2009 4:46 PM  Additional Follow-up for Phone Call Additional follow up Details #1::        returned call, left message Additional Follow-up by: Adella Hare LPN,  October 23, 2009 1:31 PM

## 2010-02-16 NOTE — Assessment & Plan Note (Signed)
Summary: new patient   Vital Signs:  Patient profile:   55 year old female Weight:      156.75 pounds O2 Sat:      96 % Pulse rate:   79 / minute Resp:     16 per minute BP sitting:   127 / 80  (left arm)  Vitals Entered By: Adella Hare LPN (March 04, 2009 8:23 AM) CC: new patient Is Patient Diabetic? No Pain Assessment Patient in pain? no        CC:  new patient.  History of Present Illness: New pt presents today to establish care with a new PCP.    Pt reports that she has had sharp RUQ abd pain for few yrs. Pain lasts for approx 5 mins when occurs. Had an ultrasound done approx 3 yrs ago which showed her liver to be enlarged.  She also states that lab work in the past year showed her liver enzymes to be elevated.  Pt states no further follow up has been done on this & she is concerned about it.    Also has chronic insomnia.  Was prescribed Doxepin to use at hs.  She takes this about once a week & doesn't like to use it regularly.  Also has a hx of syncopal episodes.  States she has had this x > 54yrs.  States that she was told this was due to her nerves/anxiety.  Pt states her pap/pelvic & mamm are UTD.  She had these done somewhere in [place].  Current Medications (verified): 1)  Neurontin 300 Mg Caps (Gabapentin) .... One Cap By Mouth Three Times A Day 2)  Pantoprazole Sodium 40 Mg Tbec (Pantoprazole Sodium) .... One Tab By Mouth Once Daily 3)  Levothyroxine Sodium 75 Mcg Tabs (Levothyroxine Sodium) .... One Tab By Mouth Once Daily 4)  Doxepin Hcl 10 Mg Caps (Doxepin Hcl) .... One To Two Caps By Mouth At Bedtime As Needed 5)  Ventolin Hfa 108 (90 Base) Mcg/act Aers (Albuterol Sulfate) .... Uad 6)  Arthritis Pain Relief 650 Mg Cr-Tabs (Acetaminophen) .... As Needed  Allergies (verified): No Known Drug Allergies  Past History:  Past medical, surgical, family and social histories (including risk factors) reviewed, and no changes noted (except as noted  below).  Past Medical History: Allergic rhinitis Chronic Bronchitis Arthritis Hypothyroidism Syncope - vasovagal per hx  Past Surgical History: Tubal ligation  Family History: Reviewed history and no changes required. Mother deceased- HTN Father deceased- HTN, stroke 8 brothers- one with downs syndrome, several HTN, thyroid, DM, heart attack 1 sister- HTN, obesity  Social History: Reviewed history and no changes required. unemployed married 22 years 2 grown children Current Smoker half pack a day Alcohol use-yes occasional Drug use-no Smoking Status:  current Drug Use:  no  Review of Systems       hoarseness with cold syptoms only General:  Denies chills, fatigue, fever, loss of appetite, weakness, and weight loss. Eyes:  Denies blurring and double vision. ENT:  Complains of hoarseness; denies difficulty swallowing, earache, nasal congestion, postnasal drainage, sinus pressure, and sore throat. CV:  Denies chest pain or discomfort, fatigue, lightheadness, palpitations, shortness of breath with exertion, and swelling of feet. Resp:  Denies cough and shortness of breath. GI:  Complains of abdominal pain; denies change in bowel habits, constipation, dark tarry stools, diarrhea, indigestion, loss of appetite, nausea, and vomiting. MS:  Denies joint pain and muscle aches. Neuro:  Denies numbness and tingling. Endo:  Denies polyuria. Heme:  Denies abnormal bruising, enlarge lymph nodes, and fevers. Allergy:  Denies hives or rash, itching eyes, persistent infections, seasonal allergies, and sneezing.  Physical Exam  General:  Well-developed,well-nourished,in no acute distress; alert,appropriate and cooperative throughout examination Head:  Normocephalic and atraumatic without obvious abnormalities. No apparent alopecia or balding. Eyes:  No corneal or conjunctival inflammation noted. EOMI. Perrla. Funduscopic exam benign, without hemorrhages, exudates or papilledema. Vision  grossly normal. Ears:  External ear exam shows no significant lesions or deformities.  Otoscopic examination reveals clear canals, tympanic membranes are intact bilaterally without bulging, retraction, inflammation or discharge. Hearing is grossly normal bilaterally. Nose:  External nasal examination shows no deformity or inflammation. Nasal mucosa are pink and moist without lesions or exudates. Mouth:  pharynx pink and moist, no erythema, no exudates, no lesions, no aphthous ulcers, no tongue abnormalities, no leukoplakia, and poor dentition.   Neck:  No deformities, masses, or tenderness noted. Lungs:  Normal respiratory effort, chest expands symmetrically. Lungs are clear to auscultation, no crackles or wheezes. Heart:  Normal rate and regular rhythm. S1 and S2 normal without gallop, murmur, click, rub or other extra sounds. Abdomen:  soft, normal bowel sounds, no guarding, no rigidity, hepatomegaly 2-3 finger breadths below CVA, and RUQ tenderness.   Extremities:  No clubbing, cyanosis, edema, or deformity noted with normal full range of motion of all joints.   Cervical Nodes:  No lymphadenopathy noted Psych:  Cognition and judgment appear intact. Alert and cooperative with normal attention span and concentration. No apparent delusions, illusions, hallucinations   Impression & Recommendations:  Problem # 1:  ABDOMINAL PAIN RIGHT UPPER QUADRANT (ICD-789.01)  Orders: T-CBC w/Diff (83151-76160) T-Comprehensive Metabolic Panel (73710-62694) T-Hepatitis Profile Acute (85462-70350) T-Vitamin D (25-Hydroxy) (09381-82993) Ultrasound (Ultrasound)  Problem # 2:  HEPATOMEGALY (ICD-789.1)  Orders: T-Comprehensive Metabolic Panel ([id number]) T-Hepatitis Profile Acute (10175-10258) Ultrasound (Ultrasound)  Problem # 3:  INSOMNIA UNSPECIFIED (ICD-780.52)  Her updated medication list for this problem includes:    Ambien 10 Mg Tabs (Zolpidem tartrate) .Marland Kitchen... 1 at bedtime as needed for  insomnia  Orders: T-Vitamin D (25-Hydroxy) [phone])  Discontinue Doxepin.  Problem # 4:  HYPOTHYROIDISM (ICD-244.9)  Her updated medication list for this problem includes:    Levothyroxine Sodium 75 Mcg Tabs (Levothyroxine sodium) ..... One tab by mouth once daily    Orders: T-Lipid Profile (36144-31540) T-TSH [phone])  Complete Medication List: 1)  Neurontin 300 Mg Caps (Gabapentin) .... One cap by mouth three times a day 2)  Pantoprazole Sodium 40 Mg Tbec (Pantoprazole sodium) .... One tab by mouth once daily 3)  Levothyroxine Sodium 75 Mcg Tabs (Levothyroxine sodium) .... One tab by mouth once daily 4)  Ventolin Hfa 108 (90 Base) Mcg/act Aers (Albuterol sulfate) .... Uad 5)  Arthritis Pain Relief 650 Mg Cr-tabs (Acetaminophen) .... As needed 6)  Ambien 10 Mg Tabs (Zolpidem tartrate) .Marland Kitchen.. 1 at bedtime as needed for insomnia  Patient Instructions: 1)  Please schedule a follow-up appointment in 2 weeks. 2)  Tobacco is very bad for your health and your loved ones! You Should stop smoking!. 3)  Stop Smoking Tips: Choose a Quit date. Cut down before the Quit date. decide what you will do as a substitute when you feel the urge to smoke(gum,toothpick,exercise). 4)  Discontinue Doxepin.  I have changed your sleeping pill to Ambien. 5)  I have ordered lab work & an ultrasound of your abdomen. Prescriptions: AMBIEN 10 MG TABS (ZOLPIDEM TARTRATE) 1 at bedtime as needed for insomnia  #30 x 1  Entered and Authorized by:   Esperanza Sheets PA   Signed by:   Esperanza Sheets PA on 03/04/2009   Method used:   Printed then faxed to ...       [facility] [address] [phone]* (retail)       [address], [place]  [address]       Ph: [phone]       Fax: [phone]   RxID:   [id number]

## 2010-02-16 NOTE — Letter (Signed)
Summary: Laboratory/X-Ray Results  [facility]  [address]   [place], [place] [address]   Phone: [phone]  Fax: [phone]    Lab/X-Ray Results  October 14, 2009  MRN: [id number]  Kelly Douglas [address] [place], [place]  [address]    The results of your recent lab/x-ray has been reviewed and were found:      Your thyroid levels are good.  Continue your current dose of thyroid medicine.  This is not what is causing you to be tired.   If you have any questions, please contact our office.     Esperanza Sheets PA

## 2010-02-16 NOTE — Assessment & Plan Note (Signed)
Summary: HEMOCCULT POSITIVE STOOL/SS   Visit Type:  Initial Consult Referring Provider:  Lodema Hong Primary Care Provider:  Lodema Hong  Chief Complaint:  Hemoccult positive.  History of Present Illness: Kelly Douglas is a pleasant 55 y/o female, patient of Dr. Lodema Hong, who presents for further evaluation of hemoccult positive stool on DRE. She has never had TCS. No history of anemia. She c/o intermittent pp loose stools, worse the last two weeks. Rarely has constipation. No brbpr, melena. She has chronic intermittent RUQ pain since 1980s. Pain is sharp. Goes from umbilius to RUQ and then back down. Last for minutes at a time. Happens twice per month. No N/V. Heartburn controlled on pantoprazole. Sometimes feels like lump in throat. Recent Abd U/S-->fatty liver. ALT 38. Viral markers negative.   Current Medications (verified): 1)  Neurontin 300 Mg Caps (Gabapentin) .... One Cap By Mouth Two Times A Day 2)  Pantoprazole Sodium 40 Mg Tbec (Pantoprazole Sodium) .... One Tab By Mouth Once Daily 3)  Levothyroxine Sodium 75 Mcg Tabs (Levothyroxine Sodium) .... One Tab By Mouth Once Daily 4)  Ambien 10 Mg Tabs (Zolpidem Tartrate) .Marland Kitchen.. 1 At Bedtime As Needed For Insomnia 5)  Multivitamins  Tabs (Multiple Vitamin) .... Take 1 Tablet By Mouth Once A Day 6)  Sertraline Hcl 100 Mg Tabs (Sertraline Hcl) .... Take 1/2 Tablet For 10 Days Then Increase To 1 Tablet Daily 7)  Spiriva Handihaler 18 Mcg Caps (Tiotropium Bromide Monohydrate) .... Use 1 Inhalation Once Daily 8)  Vitamin D .... One Tablet Daily  Allergies (verified): No Known Drug Allergies  Past History:  Past Medical History: Allergic rhinitis Chronic Bronchitis Arthritis Hypothyroidism Syncope - vasovagal per hx Scleroderma GERD Chronic back pain  Past Surgical History: Reviewed history from 03/04/2009 and no changes required. Tubal ligation  Family History: Mother deceased- HTN Father deceased- HTN, stroke 8 brothers- one with downs  syndrome, several HTN, thyroid, DM, heart attack 1 sister- HTN, obesity No FH CRC, liver, chronic GI illnesses. One brother had throat cancer  Social History: Disabled. married 22 years 2 grown children Current Smoker half pack a day, trying to quit Alcohol use-yes, one 12 ounce beer most days Drug use-no  Review of Systems General:  Denies fever, chills, sweats, anorexia, fatigue, weakness, and weight loss. Eyes:  Denies vision loss. ENT:  Denies nasal congestion, sore throat, hoarseness, and difficulty swallowing. CV:  Complains of syncope; denies chest pains, angina, palpitations, dyspnea on exertion, and peripheral edema; chronic h/o syncope for which she is on disability. Resp:  Denies dyspnea at rest, dyspnea with exercise, cough, sputum, and wheezing. GI:  See HPI. GU:  Denies urinary burning and blood in urine. MS:  Complains of low back pain. Derm:  Denies rash and itching. Neuro:  Denies weakness, frequent headaches, memory loss, and confusion. Psych:  Denies depression and anxiety. Endo:  Denies unusual weight change. Heme:  Denies bruising and bleeding. Allergy:  Denies hives and rash.  Vital Signs:  Patient profile:   55 year old female Height:      67 inches Weight:      147 pounds BMI:     23.11 Temp:     98.6 degrees F oral Pulse rate:   88 / minute BP sitting:   120 / 80  (left arm) Cuff size:   regular  Vitals Entered By: Cloria Spring LPN (July 22, 2954 11:31 AM)  Physical Exam  General:  Well developed, well nourished, no acute distress. Head:  Normocephalic and atraumatic.  Eyes:  Conjunctivae pink, no scleral icterus.  Mouth:  Oropharyngeal mucosa moist, pink.  No lesions, erythema or exudate.   poor dentition.   Neck:  Supple; no masses or thyromegaly. Lungs:  Clear throughout to auscultation. Heart:  Regular rate and rhythm; no murmurs, rubs,  or bruits. Abdomen:  Bowel sounds normal.  Abdomen is soft, nontender, nondistended.  No rebound or  guarding.  No hepatosplenomegaly, masses or hernias.  No abdominal bruits.  Rectal:  deferred until time of colonoscopy.   Extremities:  No clubbing, cyanosis, edema or deformities noted. Neurologic:  Alert and  oriented x4;  grossly normal neurologically. Skin:  Intact without significant lesions or rashes. Cervical Nodes:  No significant cervical adenopathy. Psych:  Alert and cooperative. Normal mood and affect.  Impression & Recommendations:  Problem # 1:  HEMOCCULT POSITIVE STOOL (ICD-578.1)  No overt GI bleeding. No prior TCS. She has chronic intermittent pp loose stools. She also has chronic controlled GERD, chronic RUQ pain. Colonoscopy+/-EGD to be performed in near future.  Risks, alternatives, and benefits including but not limited to the risk of reaction to medication, bleeding, infection, and perforation were addressed.  Patient voiced understanding and provided verbal consent.   Orders: Consultation Level IV (04540)  Problem # 2:  GERD (ICD-530.81)  Symptoms controlled on Nexium. Really denies dysphagia. C/O lump in throat at times. H/O scleroderma.  Orders: Consultation Level IV (98119)  Problem # 3:  ABDOMINAL PAIN RIGHT UPPER QUADRANT (ICD-789.01)  Chronic RUQ pain off/on since 1980s. Abd U/S as above. No obvious findings on physical exam. After colonoscopy, consider CT A/P to r/o atypical hernia (Speigelian).   Orders: Consultation Level IV (14782) I would like to thank Dr. Lodema Hong for allowing [place] to take part in the care of this nice patient.  Appended Document: HEMOCCULT POSITIVE STOOL/SS Please find out why this pt was never scheduled for TCS+/-EGD (this is from 07/21/09). Did I forget to write it on encounter form, pt decline, etc???  Appended Document: HEMOCCULT POSITIVE STOOL/SS Pt was told that we would contact her..She is scheduled for procedure 09/28/09@10 :45a.m.  Appended Document: HEMOCCULT POSITIVE STOOL/SS See scanned copy of encounter.  Pt stated that  day that she would not be able to do until August, documented by Hendricks Limes.  Pt was placed on reminder call back list by Diana Eves.  Appended Document: HEMOCCULT POSITIVE STOOL/SS Thanks for responses. I couldn't tell since no correspondence of such in EMR.

## 2010-02-18 NOTE — Letter (Signed)
Summary: 1st missed appt  1st missed appt   Imported By: Lind Guest 01/19/2010 13:41:01  _____________________________________________________________________  External Attachment:    Type:   Image     Comment:   External Document

## 2010-02-18 NOTE — Progress Notes (Signed)
Summary: rx request for dexilant  Phone Note Call from Patient Call back at Home Phone [phone]   Caller: Patient Summary of Call: pt needs rx sent in for Dexilant to [facility]. pt stated it is working well. Informed her that we may have to do a PA for it. Pt stated she has already tried pantoprazole, nexium and prilosec.  Initial call taken by: Hendricks Limes LPN,  January 19, 2010 12:05 PM     Appended Document: rx request for dexilant    Prescriptions: DEXILANT 60 MG CPDR (DEXLANSOPRAZOLE) 1 by mouth daily for acid reflux  #31 x 11   Entered and Authorized by:   Joselyn Arrow FNP-BC   Signed by:   Joselyn Arrow FNP-BC on 01/19/2010   Method used:   Electronically to        [facility] [address] [phone]* (retail)       [address] [place]  [address]       Ph: [phone]       Fax: [phone]   RxID:   [id number]

## 2010-04-03 LAB — LIPASE, BLOOD: Lipase: 22 U/L (ref 11–59)

## 2010-04-03 LAB — DIFFERENTIAL
Basophils Relative: 0 % (ref 0–1)
Eosinophils Absolute: 0 10*3/uL (ref 0.0–0.7)
Eosinophils Relative: 0 % (ref 0–5)
Lymphocytes Relative: 44 % (ref 12–46)
Lymphs Abs: 5.9 10*3/uL — ABNORMAL HIGH (ref 0.7–4.0)
Monocytes Absolute: 0.9 10*3/uL (ref 0.1–1.0)
Monocytes Relative: 7 % (ref 3–12)
Neutrophils Relative %: 49 % (ref 43–77)

## 2010-04-03 LAB — BASIC METABOLIC PANEL
BUN: 4 mg/dL — ABNORMAL LOW (ref 6–23)
CO2: 27 mEq/L (ref 19–32)
Calcium: 9.2 mg/dL (ref 8.4–10.5)
Creatinine, Ser: 0.89 mg/dL (ref 0.4–1.2)
Glucose, Bld: 172 mg/dL — ABNORMAL HIGH (ref 70–99)

## 2010-04-03 LAB — URINALYSIS, ROUTINE W REFLEX MICROSCOPIC
Bilirubin Urine: NEGATIVE
Urobilinogen, UA: 0.2 mg/dL (ref 0.0–1.0)

## 2010-04-03 LAB — CBC
HCT: 37.7 % (ref 36.0–46.0)
Hemoglobin: 12.7 g/dL (ref 12.0–15.0)
MCH: 27.2 pg (ref 26.0–34.0)
RBC: 4.66 MIL/uL (ref 3.87–5.11)
RDW: 14.8 % (ref 11.5–15.5)

## 2010-04-03 LAB — POCT CARDIAC MARKERS: Myoglobin, poc: 18.8 ng/mL (ref 12–200)

## 2010-05-02 ENCOUNTER — Ambulatory Visit (HOSPITAL_COMMUNITY)
Admission: RE | Admit: 2010-05-02 | Discharge: 2010-05-02 | Disposition: A | Discharge status: Home / Self Care | Payer: Medicare Other | Source: Ambulatory Visit | Attending: Emergency Medicine | Admitting: Emergency Medicine

## 2010-05-02 ENCOUNTER — Emergency Department (HOSPITAL_COMMUNITY)
Admission: EM | Admit: 2010-05-02 | Discharge: 2010-05-02 | Disposition: A | Discharge status: Home / Self Care | Payer: Medicare Other | Attending: Emergency Medicine | Admitting: Emergency Medicine

## 2010-05-02 ENCOUNTER — Other Ambulatory Visit (HOSPITAL_COMMUNITY): Payer: Self-pay | Admitting: Emergency Medicine

## 2010-05-02 ENCOUNTER — Other Ambulatory Visit: Payer: Self-pay | Admitting: Emergency Medicine

## 2010-05-02 DIAGNOSIS — R52 Pain, unspecified: Secondary | ICD-10-CM

## 2010-05-02 DIAGNOSIS — K219 Gastro-esophageal reflux disease without esophagitis: Secondary | ICD-10-CM | POA: Insufficient documentation

## 2010-05-02 DIAGNOSIS — R079 Chest pain, unspecified: Secondary | ICD-10-CM | POA: Insufficient documentation

## 2010-05-02 DIAGNOSIS — R918 Other nonspecific abnormal finding of lung field: Secondary | ICD-10-CM | POA: Insufficient documentation

## 2010-05-02 DIAGNOSIS — R1013 Epigastric pain: Secondary | ICD-10-CM | POA: Insufficient documentation

## 2010-05-02 LAB — CBC
HCT: 34.5 % — ABNORMAL LOW (ref 36.0–46.0)
MCH: 25.8 pg — ABNORMAL LOW (ref 26.0–34.0)
MCHC: 33.9 g/dL (ref 30.0–36.0)
MCV: 76 fL — ABNORMAL LOW (ref 78.0–100.0)
Platelets: 244 10*3/uL (ref 150–400)
RBC: 4.54 MIL/uL (ref 3.87–5.11)

## 2010-05-02 LAB — URINALYSIS, ROUTINE W REFLEX MICROSCOPIC
Bilirubin Urine: NEGATIVE
Glucose, UA: NEGATIVE mg/dL
Ketones, ur: 15 mg/dL — AB
Nitrite: NEGATIVE
Protein, ur: NEGATIVE mg/dL
Specific Gravity, Urine: 1.025 (ref 1.005–1.030)
Urobilinogen, UA: 0.2 mg/dL (ref 0.0–1.0)
pH: 6 (ref 5.0–8.0)

## 2010-05-02 LAB — DIFFERENTIAL
Basophils Relative: 0 % (ref 0–1)
Eosinophils Relative: 1 % (ref 0–5)
Lymphocytes Relative: 29 % (ref 12–46)
Lymphs Abs: 4.7 10*3/uL — ABNORMAL HIGH (ref 0.7–4.0)
Monocytes Relative: 7 % (ref 3–12)
Neutro Abs: 10.1 10*3/uL — ABNORMAL HIGH (ref 1.7–7.7)

## 2010-05-02 LAB — URINE MICROSCOPIC-ADD ON

## 2010-05-02 LAB — COMPREHENSIVE METABOLIC PANEL
Albumin: 4.1 g/dL (ref 3.5–5.2)
Alkaline Phosphatase: 91 U/L (ref 39–117)
GFR calc Af Amer: >60 mL/min (ref >60)
GFR calc non Af Amer: >60 mL/min (ref >60)
Total Protein: 7.4 g/dL (ref 6.0–8.3)

## 2010-05-02 LAB — LIPASE, BLOOD: Lipase: 14 U/L (ref 11–59)

## 2010-06-17 ENCOUNTER — Telehealth: Payer: Self-pay

## 2010-06-17 NOTE — Telephone Encounter (Signed)
Pt called- dexilant is too expensive. She is requesting rx for prilosec sent to [facility]. She stated she has tried it before and it seems to work ok.

## 2010-06-18 ENCOUNTER — Telehealth: Payer: Self-pay | Admitting: Urgent Care

## 2010-06-18 ENCOUNTER — Other Ambulatory Visit: Payer: Self-pay | Admitting: Urgent Care

## 2010-06-18 MED ORDER — OMEPRAZOLE 20 MG PO CPDR: 20.0000 mg | DELAYED_RELEASE_CAPSULE | Freq: Every day | ORAL | Status: DC

## 2010-06-18 NOTE — Telephone Encounter (Signed)
Pt needs OV 

## 2010-06-24 ENCOUNTER — Encounter: Payer: Self-pay | Admitting: Urgent Care

## 2010-06-24 NOTE — Telephone Encounter (Signed)
Pt is aware of OV for 6/29 @ 0830 wit KJ

## 2010-07-16 ENCOUNTER — Ambulatory Visit (INDEPENDENT_AMBULATORY_CARE_PROVIDER_SITE_OTHER): Payer: Medicare Other | Admitting: Urgent Care

## 2010-07-16 ENCOUNTER — Encounter: Payer: Self-pay | Admitting: Urgent Care

## 2010-07-16 DIAGNOSIS — K219 Gastro-esophageal reflux disease without esophagitis: Secondary | ICD-10-CM

## 2010-07-16 MED ORDER — PANTOPRAZOLE SODIUM 40 MG PO TBEC: 40.0000 mg | DELAYED_RELEASE_TABLET | Freq: Every day | ORAL | Status: DC

## 2010-07-16 NOTE — Progress Notes (Signed)
Referring Provider: Syliva Overman, MD Primary Care Physician:  Syliva Overman, MD, MD Primary Gastroenterologist:  Dr. Jena Gauss  Chief Complaint  Patient presents with  . Medication Refill    HPI:  Kelly Douglas is a 55 y.o. female here for follow up for GERD.  Off PPI x 1 week in April 2012.  Trial omeprazole 20mg  daily x1 month, some breakthrough heartburn.  Symptoms better controlled on protonix 40mg  daily.  T10 lytic lesion-pt unsure if she has had further imaging w/ PCP.  WIll need FU from incidental finding last yr. Past Medical History  Diagnosis Date  . GERD (gastroesophageal reflux disease)     Dr Jena Gauss EGD 09/2009->esophagitis, sm HH, antral erosions, atonic esophagus  . Seasonal allergies   . Arthritis   . Hypothyroid   . Scleroderma   . Lesion of lung     left  . Chronic back pain    Past Surgical History  Procedure Date  . Tubal ligation    Current Outpatient Prescriptions  Medication Sig Dispense Refill  . gabapentin (NEURONTIN) 300 MG capsule Take 300 mg by mouth 3 (three) times daily.        Marland Kitchen levothyroxine (SYNTHROID, LEVOTHROID) 75 MCG tablet Take 75 mcg by mouth daily.        Marland Kitchen omeprazole (PRILOSEC) 20 MG capsule TAKE ONE CAPSULE BY MOUTH EVERY DAY  90 capsule  0  . sertraline (ZOLOFT) 100 MG tablet Take 100 mg by mouth daily.        . traMADol (ULTRAM) 50 MG tablet Take 50 mg by mouth every 6 (six) hours as needed.        . Vitamin D, Ergocalciferol, (DRISDOL) 50000 UNITS CAPS Take 50,000 Units by mouth.        . pantoprazole (PROTONIX) 40 MG tablet Take 1 tablet (40 mg total) by mouth daily.  30 tablet  11  . zolpidem (AMBIEN) 10 MG tablet Take 10 mg by mouth at bedtime as needed.         Allergies as of 07/16/2010  . (No Known Allergies)   Review of Systems: Gen: Denies any fever, chills, sweats, anorexia, fatigue, weakness, malaise, weight loss, and sleep disorder CV: Denies chest pain, angina, palpitations, syncope, orthopnea, PND, peripheral  edema, and claudication. Resp: Denies dyspnea at rest, dyspnea with exercise, cough, sputum, wheezing, coughing up blood, and pleurisy. GI: Denies vomiting blood, jaundice, and fecal incontinence.   Denies dysphagia or odynophagia. Derm: Denies rash, itching, dry skin, hives, moles, warts, or unhealing ulcers.  Psych: Denies depression, anxiety, memory loss, suicidal ideation, hallucinations, paranoia, and confusion. Heme: Denies bruising, bleeding, and enlarged lymph nodes.  Physical Exam: BP 123/81  Pulse 88  Temp(Src) 97.7 F (36.5 C) (Temporal)  Ht 5\' 9"  (1.753 m)  Wt 150 lb 9.6 oz (68.312 kg)  BMI 22.24 kg/m2 General:   Alert,  Well-developed, well-nourished, pleasant and cooperative in NAD Head:  Normocephalic and atraumatic. Eyes:  Sclera clear, no icterus.   Conjunctiva pink. Mouth:  No deformity or lesions, dentition normal. Neck:  Supple; no masses or thyromegaly. Heart:  Regular rate and rhythm; no murmurs, clicks, rubs,  or gallops. Abdomen:  Soft, nontender and nondistended. No masses, hepatosplenomegaly or hernias noted. Normal bowel sounds, without guarding, and without rebound.   Msk:  Symmetrical without gross deformities. Normal posture. Pulses:  Normal pulses noted. Extremities:  Without clubbing or edema. Neurologic:  Alert and  oriented x4;  grossly normal neurologically. Skin:  Intact without significant lesions  or rashes. Cervical Nodes:  No significant cervical adenopathy. Psych:  Alert and cooperative. Normal mood and affect.

## 2010-07-16 NOTE — Assessment & Plan Note (Addendum)
Breakthrough on omeprazole.  Resume protonix 40mg  daily.  Urged to FU w/ PCP Dr Lodema Hong re:T10 lytic lesion seen on CT.  She agrees.

## 2010-07-16 NOTE — Progress Notes (Signed)
agree

## 2010-07-19 NOTE — Progress Notes (Signed)
Cc to PCP 

## 2010-08-31 ENCOUNTER — Encounter: Payer: Self-pay | Admitting: Family Medicine

## 2010-09-02 ENCOUNTER — Other Ambulatory Visit: Payer: Self-pay | Admitting: *Deleted

## 2010-09-02 ENCOUNTER — Encounter: Payer: Self-pay | Admitting: Family Medicine

## 2010-09-02 ENCOUNTER — Ambulatory Visit (INDEPENDENT_AMBULATORY_CARE_PROVIDER_SITE_OTHER): Payer: Medicare Other | Admitting: Family Medicine

## 2010-09-02 VITALS — BP 160/90 | HR 86 | Ht 68.0 in | Wt 147.1 lb

## 2010-09-02 DIAGNOSIS — R7301 Impaired fasting glucose: Secondary | ICD-10-CM

## 2010-09-02 DIAGNOSIS — F411 Generalized anxiety disorder: Secondary | ICD-10-CM

## 2010-09-02 DIAGNOSIS — F329 Major depressive disorder, single episode, unspecified: Secondary | ICD-10-CM

## 2010-09-02 DIAGNOSIS — Z79899 Other long term (current) drug therapy: Secondary | ICD-10-CM

## 2010-09-02 DIAGNOSIS — G4700 Insomnia, unspecified: Secondary | ICD-10-CM

## 2010-09-02 DIAGNOSIS — E039 Hypothyroidism, unspecified: Secondary | ICD-10-CM

## 2010-09-02 DIAGNOSIS — R5381 Other malaise: Secondary | ICD-10-CM

## 2010-09-02 DIAGNOSIS — F3289 Other specified depressive episodes: Secondary | ICD-10-CM

## 2010-09-02 DIAGNOSIS — K219 Gastro-esophageal reflux disease without esophagitis: Secondary | ICD-10-CM

## 2010-09-02 DIAGNOSIS — J309 Allergic rhinitis, unspecified: Secondary | ICD-10-CM

## 2010-09-02 MED ORDER — TEMAZEPAM 15 MG PO CAPS: 15.0000 mg | ORAL_CAPSULE | Freq: Every evening | ORAL | Status: DC | PRN

## 2010-09-02 MED ORDER — LEVOTHYROXINE SODIUM 75 MCG PO TABS: 75.0000 ug | ORAL_TABLET | Freq: Every day | ORAL | Status: DC

## 2010-09-02 MED ORDER — FLUTICASONE PROPIONATE 50 MCG/ACT NA SUSP: 1.0000 | Freq: Every day | NASAL | Status: DC

## 2010-09-02 MED ORDER — TRAMADOL HCL 50 MG PO TABS: 50.0000 mg | ORAL_TABLET | Freq: Four times a day (QID) | ORAL | Status: DC | PRN

## 2010-09-02 MED ORDER — SERTRALINE HCL 100 MG PO TABS: 100.0000 mg | ORAL_TABLET | Freq: Every day | ORAL | Status: DC

## 2010-09-02 NOTE — Patient Instructions (Addendum)
F/u in 2 months.  Pls stop smoking, this will improve your health.  Your sertraline  Will help with your anxiety.New med for sleep. Turn off all light and sound at bedtime  Stop zolpidem it does  Not help you  Labs tomorrow, fasting  You will be referred to Dr Kellie Simmering about the scleroderma

## 2010-09-02 NOTE — Progress Notes (Signed)
  Subjective:    Patient ID: Kelly Douglas, female    DOB: 12-May-1955, 55 y.o.   MRN: [id number]  HPI Pt has been having increased anxiety in the past month, recently her son was incarcerated, she had been off zoloft for a while   Review of Systems     Objective:   Physical Exam        Assessment & Plan:

## 2010-09-03 ENCOUNTER — Other Ambulatory Visit: Payer: Self-pay | Admitting: Family Medicine

## 2010-09-03 NOTE — Progress Notes (Signed)
Addended by: Abner Greenspan on: 09/03/2010 02:22 PM   Modules accepted: Orders

## 2010-09-04 LAB — BASIC METABOLIC PANEL
BUN: 5 mg/dL — ABNORMAL LOW (ref 6–23)
Chloride: 101 mEq/L (ref 96–112)
Potassium: 3.7 mEq/L (ref 3.5–5.3)
Sodium: 141 mEq/L (ref 135–145)

## 2010-09-04 LAB — CBC WITH DIFFERENTIAL/PLATELET
Basophils Absolute: 0 10*3/uL (ref 0.0–0.1)
Basophils Relative: 0 % (ref 0–1)
HCT: 40.1 % (ref 36.0–46.0)
Hemoglobin: 13 g/dL (ref 12.0–15.0)
Lymphocytes Relative: 45 % (ref 12–46)
Monocytes Absolute: 0.8 10*3/uL (ref 0.1–1.0)
Neutro Abs: 4.2 10*3/uL (ref 1.7–7.7)
Neutrophils Relative %: 45 % (ref 43–77)
RDW: 17.4 % — ABNORMAL HIGH (ref 11.5–15.5)
WBC: 9.4 10*3/uL (ref 4.0–10.5)

## 2010-09-04 LAB — LIPID PANEL
Cholesterol: 168 mg/dL (ref 0–200)
HDL: 31 mg/dL — ABNORMAL LOW (ref >39)
LDL Cholesterol: 118 mg/dL — ABNORMAL HIGH (ref 0–99)
Total CHOL/HDL Ratio: 5.4 Ratio
Triglycerides: 97 mg/dL (ref <150)
VLDL: 19 mg/dL (ref 0–40)

## 2010-09-05 NOTE — Assessment & Plan Note (Signed)
Uncontrolled,sleep hygiene discussed , pt to start medication

## 2010-09-05 NOTE — Assessment & Plan Note (Signed)
Check TSH and decide on appropriate dose

## 2010-09-05 NOTE — Assessment & Plan Note (Signed)
Controlled, no change in medication  

## 2010-09-05 NOTE — Assessment & Plan Note (Signed)
Uncontrolled currently, but sertraline will help

## 2010-09-06 ENCOUNTER — Telehealth: Payer: Self-pay | Admitting: Family Medicine

## 2010-09-06 NOTE — Telephone Encounter (Signed)
pls add vit D level to lab  Please advise the patient that their blood sugars are higher than normal.   A normal HbA1C is less than 5.7, please give them their value.  They need to cut back on carb's and sweets, start regular exercise, target at least 30 minutes 5 days a weekly and aim for weight loss.  This will reduce the risk of becoming diabetic or delayed development. pls advise cholesterol slightly high, needs to reduce fried and fatty foods.  Thyroid test is normal continue current med dose

## 2010-09-06 NOTE — Telephone Encounter (Signed)
Left message with family member to have her call me back

## 2010-09-06 NOTE — Telephone Encounter (Signed)
Lab added

## 2010-09-06 NOTE — Telephone Encounter (Signed)
Patient aware.

## 2010-09-09 ENCOUNTER — Telehealth: Payer: Self-pay | Admitting: Family Medicine

## 2010-09-10 NOTE — Telephone Encounter (Signed)
Calling on lab result, left message See lab result note

## 2010-11-12 ENCOUNTER — Encounter: Payer: Self-pay | Admitting: Family Medicine

## 2010-11-17 ENCOUNTER — Ambulatory Visit (INDEPENDENT_AMBULATORY_CARE_PROVIDER_SITE_OTHER): Payer: Medicare Other | Admitting: Family Medicine

## 2010-11-17 ENCOUNTER — Encounter: Payer: Self-pay | Admitting: Family Medicine

## 2010-11-17 VITALS — BP 140/80 | HR 88 | Resp 16 | Ht 68.0 in | Wt 143.8 lb

## 2010-11-17 DIAGNOSIS — F329 Major depressive disorder, single episode, unspecified: Secondary | ICD-10-CM

## 2010-11-17 DIAGNOSIS — R7303 Prediabetes: Secondary | ICD-10-CM

## 2010-11-17 DIAGNOSIS — R7301 Impaired fasting glucose: Secondary | ICD-10-CM

## 2010-11-17 DIAGNOSIS — Z113 Encounter for screening for infections with a predominantly sexual mode of transmission: Secondary | ICD-10-CM

## 2010-11-17 DIAGNOSIS — F17200 Nicotine dependence, unspecified, uncomplicated: Secondary | ICD-10-CM

## 2010-11-17 DIAGNOSIS — G4700 Insomnia, unspecified: Secondary | ICD-10-CM

## 2010-11-17 DIAGNOSIS — M549 Dorsalgia, unspecified: Secondary | ICD-10-CM

## 2010-11-17 DIAGNOSIS — R7309 Other abnormal glucose: Secondary | ICD-10-CM

## 2010-11-17 DIAGNOSIS — Z23 Encounter for immunization: Secondary | ICD-10-CM

## 2010-11-17 DIAGNOSIS — J449 Chronic obstructive pulmonary disease, unspecified: Secondary | ICD-10-CM

## 2010-11-17 DIAGNOSIS — E039 Hypothyroidism, unspecified: Secondary | ICD-10-CM

## 2010-11-17 MED ORDER — TEMAZEPAM 15 MG PO CAPS: 15.0000 mg | ORAL_CAPSULE | Freq: Every evening | ORAL | Status: DC | PRN

## 2010-11-17 MED ORDER — KETOROLAC TROMETHAMINE 60 MG/2ML IM SOLN
60.0000 mg | Freq: Once | INTRAMUSCULAR | Status: AC
  Administered 2010-11-17: 60 mg via INTRAMUSCULAR

## 2010-11-17 NOTE — Patient Instructions (Addendum)
CPE in 4 months.  RPR today.to check on the spots on the hands  Flu vaccine today.  Please continue to plan to quit cigarettes, goal of 10 per day in the next 4 months  Mammogram to be scheduled at checkout  HBA1C in 4 months, pls call and go to the class on diabetes since you are prediabetic.  Tylenol for back pain, toradol  60 mg today for back pain   Call Dr Ozzie Hoyle office for eppidural injection since you have been there before  Try and get dental health taken care of

## 2010-11-18 ENCOUNTER — Other Ambulatory Visit: Payer: Self-pay | Admitting: Family Medicine

## 2010-11-18 DIAGNOSIS — Z139 Encounter for screening, unspecified: Secondary | ICD-10-CM

## 2010-11-21 NOTE — Assessment & Plan Note (Signed)
Controlled, no change in medication  

## 2010-11-21 NOTE — Assessment & Plan Note (Signed)
Unchanged, counseled to quit 

## 2010-11-21 NOTE — Assessment & Plan Note (Signed)
Deteiorated, sweep hygiene discussed, pt to start medication also

## 2010-11-21 NOTE — Progress Notes (Signed)
  Subjective:    Patient ID: Kelly Douglas, female    DOB: [birth year], 55 y.o.   MRN: [id number]  HPI /The PT is here for follow up and re-evaluation of chronic medical conditions, medication management and review of any available recent lab and radiology data.  Preventive health is updated, specifically  Cancer screening and Immunization.   Questions or concerns regarding consultations or procedures which the PT has had in the interim are  addressed. The PT denies any adverse reactions to current medications since the last visit.  C/o dark spots on soles and palms for years but wants to know why. C/o difficulty falling and staying asleep     Review of Systems See HPI Denies recent fever or chills. Denies sinus pressure, nasal congestion, ear pain or sore throat. Denies chest congestion, productive cough or wheezing. Denies chest pains, palpitations and leg swelling Denies abdominal pain, nausea, vomiting,diarrhea or constipation.   Denies dysuria, frequency, hesitancy or incontinence. Denies joint pain, swelling and limitation in mobility. Denies headaches, seizures, numbness, or tingling.         Objective:   Physical Exam Patient alert and oriented and in no cardiopulmonary distress.  HEENT: No facial asymmetry, EOMI, no sinus tenderness,  oropharynx pink and moist.  Neck supple no adenopathy.extremely poor dentition  Chest: Clear to auscultation bilaterally.  CVS: S1, S2 no murmurs, no S3.  ABD: Soft non tender. Bowel sounds normal.  Ext: No edema  MS: Adequate ROM spine, shoulders, hips and knees.  Skin: Intact,hyperpigmentes macular lesions on palms and soles, annular  Psych: Good eye contact, normal affect. Memory intact not anxious or depressed appearing.  CNS: CN 2-12 intact, power, tone and sensation normal throughout.        Assessment & Plan:

## 2010-11-21 NOTE — Assessment & Plan Note (Signed)
Stable and controlled

## 2010-11-25 ENCOUNTER — Ambulatory Visit (HOSPITAL_COMMUNITY): Payer: Medicare Other

## 2011-01-18 DIAGNOSIS — I2510 Atherosclerotic heart disease of native coronary artery without angina pectoris: Secondary | ICD-10-CM

## 2011-01-18 HISTORY — PX: BILATERAL SALPINGOOPHORECTOMY: SHX1223

## 2011-01-18 HISTORY — DX: Atherosclerotic heart disease of native coronary artery without angina pectoris: I25.10

## 2011-02-17 ENCOUNTER — Other Ambulatory Visit: Payer: Self-pay | Admitting: Family Medicine

## 2011-02-25 ENCOUNTER — Other Ambulatory Visit: Payer: Self-pay

## 2011-02-25 MED ORDER — GABAPENTIN 300 MG PO CAPS: 300.0000 mg | ORAL_CAPSULE | Freq: Three times a day (TID) | ORAL | Status: DC

## 2011-03-01 ENCOUNTER — Telehealth (HOSPITAL_COMMUNITY): Payer: Self-pay | Admitting: Dietician

## 2011-03-04 ENCOUNTER — Other Ambulatory Visit: Payer: Self-pay | Admitting: Family Medicine

## 2011-03-08 NOTE — Telephone Encounter (Signed)
Pt scheduled to attend diabetes class on 03/01/11 at 10:00 AM. Pt did not attend class.

## 2011-03-17 ENCOUNTER — Other Ambulatory Visit: Payer: Self-pay | Admitting: Family Medicine

## 2011-03-19 LAB — HEMOGLOBIN A1C
Hgb A1c MFr Bld: 6 % — ABNORMAL HIGH (ref <5.7)
Mean Plasma Glucose: 126 mg/dL — ABNORMAL HIGH (ref <117)

## 2011-03-22 ENCOUNTER — Encounter: Payer: Self-pay | Admitting: Family Medicine

## 2011-03-22 ENCOUNTER — Ambulatory Visit (INDEPENDENT_AMBULATORY_CARE_PROVIDER_SITE_OTHER): Payer: Medicare Other | Admitting: Family Medicine

## 2011-03-22 ENCOUNTER — Other Ambulatory Visit (HOSPITAL_COMMUNITY)
Admission: RE | Admit: 2011-03-22 | Discharge: 2011-03-22 | Disposition: A | Discharge status: Home / Self Care | Payer: Medicare Other | Source: Ambulatory Visit | Attending: Family Medicine | Admitting: Family Medicine

## 2011-03-22 VITALS — BP 126/80 | HR 81 | Resp 18 | Ht 68.0 in | Wt 141.1 lb

## 2011-03-22 DIAGNOSIS — R911 Solitary pulmonary nodule: Secondary | ICD-10-CM

## 2011-03-22 DIAGNOSIS — G4700 Insomnia, unspecified: Secondary | ICD-10-CM

## 2011-03-22 DIAGNOSIS — Z0000 Encounter for general adult medical examination without abnormal findings: Secondary | ICD-10-CM

## 2011-03-22 DIAGNOSIS — N83209 Unspecified ovarian cyst, unspecified side: Secondary | ICD-10-CM

## 2011-03-22 DIAGNOSIS — Z01419 Encounter for gynecological examination (general) (routine) without abnormal findings: Secondary | ICD-10-CM | POA: Insufficient documentation

## 2011-03-22 LAB — POC HEMOCCULT BLD/STL (OFFICE/1-CARD/DIAGNOSTIC): Fecal Occult Blood, POC: NEGATIVE

## 2011-03-22 MED ORDER — TRIAZOLAM 0.25 MG PO TABS: 0.2500 mg | ORAL_TABLET | Freq: Every evening | ORAL | Status: DC | PRN

## 2011-03-22 NOTE — Patient Instructions (Signed)
F/u in 6 to 7 weeks.  You are referred for a scan of your chest and pelvis as we discussed.  Pls cut back on nicotine, you need to stop smoking.  New medication for sleep, I hope this helps.  You need to make appt for eye exam, and ensure you keep appt for mammogram

## 2011-03-25 ENCOUNTER — Ambulatory Visit (HOSPITAL_COMMUNITY)
Admission: RE | Admit: 2011-03-25 | Discharge: 2011-03-25 | Disposition: A | Discharge status: Home / Self Care | Payer: Medicare Other | Source: Ambulatory Visit | Attending: Family Medicine | Admitting: Family Medicine

## 2011-03-25 ENCOUNTER — Other Ambulatory Visit: Payer: Self-pay | Admitting: Family Medicine

## 2011-03-25 DIAGNOSIS — N83209 Unspecified ovarian cyst, unspecified side: Secondary | ICD-10-CM

## 2011-03-25 DIAGNOSIS — R9389 Abnormal findings on diagnostic imaging of other specified body structures: Secondary | ICD-10-CM | POA: Insufficient documentation

## 2011-03-25 DIAGNOSIS — R911 Solitary pulmonary nodule: Secondary | ICD-10-CM | POA: Insufficient documentation

## 2011-03-25 DIAGNOSIS — R1031 Right lower quadrant pain: Secondary | ICD-10-CM | POA: Insufficient documentation

## 2011-03-28 ENCOUNTER — Telehealth: Payer: Self-pay | Admitting: Family Medicine

## 2011-03-29 ENCOUNTER — Other Ambulatory Visit: Payer: Self-pay | Admitting: Family Medicine

## 2011-03-29 DIAGNOSIS — R1900 Intra-abdominal and pelvic swelling, mass and lump, unspecified site: Secondary | ICD-10-CM

## 2011-03-29 NOTE — Telephone Encounter (Signed)
Pt aware, please schedule the test

## 2011-03-29 NOTE — Telephone Encounter (Signed)
pls let pt know she will need an mRI of her pelvis as she may have a growth/mass , nOT SURE, needs more pictures.  This is important  After you speak with her please let scheduling know so that the test can be ordered

## 2011-03-30 ENCOUNTER — Other Ambulatory Visit: Payer: Self-pay | Admitting: Family Medicine

## 2011-03-30 DIAGNOSIS — R9389 Abnormal findings on diagnostic imaging of other specified body structures: Secondary | ICD-10-CM

## 2011-03-31 ENCOUNTER — Ambulatory Visit (HOSPITAL_COMMUNITY)
Admission: RE | Admit: 2011-03-31 | Discharge: 2011-03-31 | Disposition: A | Discharge status: Home / Self Care | Payer: Medicare Other | Source: Ambulatory Visit | Attending: Family Medicine | Admitting: Family Medicine

## 2011-03-31 DIAGNOSIS — R1900 Intra-abdominal and pelvic swelling, mass and lump, unspecified site: Secondary | ICD-10-CM

## 2011-03-31 DIAGNOSIS — Z1231 Encounter for screening mammogram for malignant neoplasm of breast: Secondary | ICD-10-CM | POA: Insufficient documentation

## 2011-03-31 DIAGNOSIS — Z139 Encounter for screening, unspecified: Secondary | ICD-10-CM

## 2011-03-31 DIAGNOSIS — R9389 Abnormal findings on diagnostic imaging of other specified body structures: Secondary | ICD-10-CM | POA: Insufficient documentation

## 2011-03-31 MED ORDER — GADOBENATE DIMEGLUMINE 529 MG/ML IV SOLN
13.0000 mL | Freq: Once | INTRAVENOUS | Status: AC | PRN
  Administered 2011-03-31: 13 mL via INTRAVENOUS

## 2011-04-01 ENCOUNTER — Other Ambulatory Visit: Payer: Self-pay | Admitting: Family Medicine

## 2011-04-01 DIAGNOSIS — N9489 Other specified conditions associated with female genital organs and menstrual cycle: Secondary | ICD-10-CM

## 2011-04-03 NOTE — Progress Notes (Signed)
  Subjective:    Patient ID: Kelly Douglas, female    DOB: 1955-03-16, 56 y.o.   MRN: [id number]  HPI Pt is in for annual exam She has no specific complaints , however review of records reveals previous concern regarding the presence of lung nodule and ovarian lesion , hence she will need f/u imaging of the areas of concern. She smokes and is unwilling to make a commitment to quit    Review of Systems See HPI Denies recent fever or chills. Denies sinus pressure, nasal congestion, ear pain or sore throat. Denies chest congestion, productive cough or wheezing. Denies chest pains, palpitations and leg swelling Denies abdominal pain, nausea, vomiting,diarrhea or constipation.   Denies dysuria, frequency, hesitancy or incontinence. Denies joint pain, swelling and limitation in mobility. Denies headaches, seizures, numbness, or tingling. Denies depression, anxiety or insomnia. Denies skin break down or rash.        Objective:   Physical Exam Pleasant well nourished female, alert and oriented x 3, in no cardio-pulmonary distress. Afebrile. HEENT No facial trauma or asymetry. Sinuses non tender.  EOMI, PERTL, fundoscopic exam is normal, no hemorhage or exudate.  External ears normal, tympanic membranes clear. Oropharynx moist, no exudate,poor dentition. Neck: supple, no adenopathy,JVD or thyromegaly.No bruits.  Chest: Clear to ascultation bilaterally.No crackles or wheezes.Decreased air entry throughout. Non tender to palpation  Breast: No asymetry,no masses. No nipple discharge or inversion. No axillary or supraclavicular adenopathy  Cardiovascular system; Heart sounds normal,  S1 and  S2 ,no S3.  No murmur, or thrill. Apical beat not displaced Peripheral pulses normal.  Abdomen: Soft, non tender, no organomegaly or masses. No bruits. Bowel sounds normal. No guarding, tenderness or rebound.  Rectal:  No mass. Guaiac negative stool.  GU: External genitalia  normal. No lesions. Vaginal canal normal.No discharge. Uterus atrophic, left  adnexal fullness,no cervical motion tenderness, no adnexal tenderness.  Musculoskeletal exam: Full ROM of spine, hips , shoulders and knees. No deformity ,swelling or crepitus noted. No muscle wasting or atrophy.   Neurologic: Cranial nerves 2 to 12 intact. Power, tone ,sensation and reflexes normal throughout. No disturbance in gait. No tremor.  Skin: Intact, no ulceration, erythema , scaling or rash noted. Pigmentation normal throughout  Psych; Normal mood and affect. Judgement and concentration normal        Assessment & Plan:

## 2011-04-18 ENCOUNTER — Other Ambulatory Visit: Payer: Self-pay | Admitting: Family Medicine

## 2011-04-18 DIAGNOSIS — R9389 Abnormal findings on diagnostic imaging of other specified body structures: Secondary | ICD-10-CM

## 2011-04-18 DIAGNOSIS — I2510 Atherosclerotic heart disease of native coronary artery without angina pectoris: Secondary | ICD-10-CM

## 2011-05-03 ENCOUNTER — Encounter: Payer: Self-pay | Admitting: Cardiology

## 2011-05-05 ENCOUNTER — Encounter: Payer: Self-pay | Admitting: Cardiology

## 2011-05-05 ENCOUNTER — Ambulatory Visit (INDEPENDENT_AMBULATORY_CARE_PROVIDER_SITE_OTHER): Payer: Medicare Other | Admitting: Cardiology

## 2011-05-05 DIAGNOSIS — R55 Syncope and collapse: Secondary | ICD-10-CM

## 2011-05-05 DIAGNOSIS — Z720 Tobacco use: Secondary | ICD-10-CM

## 2011-05-05 DIAGNOSIS — E781 Pure hyperglyceridemia: Secondary | ICD-10-CM

## 2011-05-05 DIAGNOSIS — J449 Chronic obstructive pulmonary disease, unspecified: Secondary | ICD-10-CM | POA: Insufficient documentation

## 2011-05-05 DIAGNOSIS — J209 Acute bronchitis, unspecified: Secondary | ICD-10-CM | POA: Insufficient documentation

## 2011-05-05 DIAGNOSIS — Z0189 Encounter for other specified special examinations: Secondary | ICD-10-CM

## 2011-05-05 DIAGNOSIS — K219 Gastro-esophageal reflux disease without esophagitis: Secondary | ICD-10-CM | POA: Insufficient documentation

## 2011-05-05 DIAGNOSIS — E039 Hypothyroidism, unspecified: Secondary | ICD-10-CM | POA: Insufficient documentation

## 2011-05-05 DIAGNOSIS — I2510 Atherosclerotic heart disease of native coronary artery without angina pectoris: Secondary | ICD-10-CM

## 2011-05-05 DIAGNOSIS — F17200 Nicotine dependence, unspecified, uncomplicated: Secondary | ICD-10-CM

## 2011-05-05 DIAGNOSIS — R079 Chest pain, unspecified: Secondary | ICD-10-CM

## 2011-05-05 DIAGNOSIS — J984 Other disorders of lung: Secondary | ICD-10-CM

## 2011-05-05 DIAGNOSIS — M349 Systemic sclerosis, unspecified: Secondary | ICD-10-CM | POA: Insufficient documentation

## 2011-05-05 MED ORDER — PRAVASTATIN SODIUM 40 MG PO TABS: 40.0000 mg | ORAL_TABLET | Freq: Every day | ORAL | Status: DC

## 2011-05-05 NOTE — Patient Instructions (Signed)
Your physician recommends that you schedule a follow-up appointment in: 1 month  Your physician has recommended you make the following change in your medication:  1 - START Aspirin 2 - START Pravachol (Pravastatin) 40 mg daily  Take temperature twice daily and return list to Dr Juanetta Gosling  Your physician has requested that you have an echocardiogram. Echocardiography is a painless test that uses sound waves to create images of your heart. It provides your doctor with information about the size and shape of your heart and how well your heart's chambers and valves are working. This procedure takes approximately one hour. There are no restrictions for this procedure.  STOP Smoking

## 2011-05-05 NOTE — Progress Notes (Signed)
Patient ID: Kelly Douglas, female   DOB: [birth year], 56 y.o.   MRN: [id number]  HPI: Patient is seen at the kind request of Dr. Lodema Hong as the result of coronary calcifications identified on a recent CT study of the chest.  Kelly Douglas has numerous cardiovascular risk factors, but no known symptomatic vascular disease.  She has never undergone stress testing, cardiac catheterization or other cardiac testing.  She has chronic class II dyspnea on exertion with left lung scarring or chronic infiltrate recently detected on the same CT scan.  She denies chest discomfort, palpitations or peripheral edema.  She has sclerodactyly, but it is unclear whether she has had systemic involvement.  She has been followed by Dr. Darrick Penna,  has had some mild dysphasia, but does not clearly have esophageal dysmotility based upon her symptoms.  Patient also reports a lifelong history of episodic dizziness with occasional frank loss of consciousness.  She experiences warmth and dizziness immediately prior to syncope.  If she does not assume a sitting position, she falls, apparently as a result of brief loss of consciousness.  She has never undergone a significant evaluation of this problem.  She reports at least one fall with significant injuries when she suffered substantial dental trauma.  Previously, multiple episodes occurred each week; however, in recent years there has been a decrease to approximately one per month.  Current Outpatient Prescriptions on File Prior to Visit  Medication Sig Dispense Refill  . Albuterol Sulfate (VENTOLIN HFA IN) Inhale into the lungs.        . Cholecalciferol (VITAMIN D3) 1000 UNITS CAPS Take 1 capsule by mouth daily.        . fluticasone (FLONASE) 50 MCG/ACT nasal spray Place 2 sprays into the nose daily.        Marland Kitchen gabapentin (NEURONTIN) 300 MG capsule Take 1 capsule (300 mg total) by mouth 3 (three) times daily.  30 capsule  0  . levothyroxine (SYNTHROID, LEVOTHROID) 75 MCG tablet TAKE 1 TABLET  BY MOUTH EVERY DAY  30 tablet  2  . pantoprazole (PROTONIX) 40 MG tablet Take 1 tablet (40 mg total) by mouth daily.  30 tablet  11  . sertraline (ZOLOFT) 100 MG tablet TAKE 1 TABLET BY MOUTH EVERY DAY  30 tablet  4  . traMADol (ULTRAM) 50 MG tablet Take 1 tablet (50 mg total) by mouth every 6 (six) hours as needed.  30 tablet  3  . pravastatin (PRAVACHOL) 40 MG tablet Take 1 tablet (40 mg total) by mouth daily.  30 tablet  12  . temazepam (RESTORIL) 15 MG capsule Take 1 capsule (15 mg total) by mouth at bedtime as needed for sleep.  30 capsule  3  . temazepam (RESTORIL) 15 MG capsule Take 1 capsule (15 mg total) by mouth at bedtime as needed for sleep.  30 capsule  3  . triazolam (HALCION) 0.25 MG tablet Take 1 tablet (0.25 mg total) by mouth at bedtime as needed.  30 tablet  1   No Known Allergies    Past Medical History  Diagnosis Date  . GERD (gastroesophageal reflux disease)     Dr Jena Gauss EGD 09/2009->esophagitis, sm HH, antral erosions, atonic esophagus  . Seasonal allergies   . Arthritis   . Scleroderma   . Chronic lung disease     Chronic scarring and volume loss-left lung; characteristics of a chronic infectious process-possible MAI  . Chronic back pain   . Hypothyroid 1981 approx  . Arteriosclerotic cardiovascular disease (ASCVD)  2013    coronary calcification-left main, LAD and CX; small pericardial effusion  . Tobacco abuse 06/24/2009    Past Surgical History  Procedure Date  . Tubal ligation     Family History  Problem Relation Age of Onset  . Throat cancer Brother   . Stroke Father   . Coronary artery disease Brother   . Down syndrome Brother   . Hypertension Brother     father/mother    History   Social History  . Marital Status: Married    Spouse Name: N/A    Number of Children: 2  . Years of Education: N/A   Occupational History  . Not on file.   Social History Main Topics  . Smoking status: Current Everyday Smoker -- 0.5 packs/day    Types: Cigarettes   . Smokeless tobacco: Not on file  . Alcohol Use: No  . Drug Use: No  . Sexually Active: Not on file   Other Topics Concern  . Not on file   Social History Narrative  . No narrative on file    ROS:  Skin discoloration, primarily over the back and hands, Chronic rhinitis, episode of diarrhea, history of Hemoccult-positive stools, insomnia, chronic back pain, fungal infection of the toenail.  All other systems reviewed and are negative.  PHYSICAL EXAM: BP 130/80  Pulse 71  Ht 5\' 8"  (1.727 m)  Wt 67.586 kg (149 lb)  BMI 22.66 kg/m2  SpO2 99%  General-Well-developed; no acute distress Body Habitus-proportionate weight and height HEENT-[place]/AT; PERRL; EOM intact; conjunctiva and lids nl; dentition in poor repair Neck-No JVD; no carotid bruits Endocrine-No thyromegaly Lungs-Clear lung fields; resonant percussion; normal I-to-E ratio Cardiovascular- normal PMI; normal S1 and S2; minimal systolic murmur Abdomen-BS normal; soft and non-tender without masses or organomegaly Musculoskeletal-No deformities, cyanosis or clubbing; arachnodactyly Neurologic-Nl cranial nerves; symmetric strength and tone; vitiligo Skin- Warm, no significant lesions Extremities-Nl distal pulses Intact with 1+ dorsalis pedis bilaterally and 2+ posterior tibials; trace edema  EKG:  Tracing performed 05/02/2010 reviewed.  Normal sinus rhythm, delayed R-wave progression, prominent QRS voltage, otherwise normal.   ASSESSMENT AND PLAN:  [place] Bing, MD 05/05/2011 1:00 PM

## 2011-05-05 NOTE — Assessment & Plan Note (Signed)
There is no substantial hyperlipidemia, but in the setting of known vascular disease, treatment is prudent.  Pravastatin will be started a dose of 40 mg per day, which should be adequate to achieve lipids in the optimal range.  A repeat lipid profile will be obtained.

## 2011-05-05 NOTE — Assessment & Plan Note (Signed)
History is suggestive of a neurocardiogenic mechanism.  The importance of avoiding falls was discussed with appropriate behavioral measures described.  If episodes continue to be rare and not damaging, I would not be inclined to proceed with electrophysiology assessment.

## 2011-05-05 NOTE — Progress Notes (Signed)
Name: Kelly Douglas    DOB: Nov 20, 1955  Age: 56 y.o.  MR#: [id number]       PCP:  Syliva Overman, MD, MD      Insurance: @PAYORNAME @   CC:    Chief Complaint  Patient presents with  . Abnormal CT chest    + fam hx CAD / Meds reviewed by phone    VS BP 130/80  Pulse 67  Ht 5\' 8"  (1.727 m)  Wt 149 lb (67.586 kg)  BMI 22.66 kg/m2  SpO2 99%  Weights Current Weight  05/05/11 149 lb (67.586 kg)  03/22/11 141 lb 1.9 oz (64.012 kg)  11/17/10 143 lb 12.8 oz (65.227 kg)    Blood Pressure  BP Readings from Last 3 Encounters:  05/05/11 130/80  03/22/11 126/80  11/17/10 140/80     Admit date:  (Not on file) Last encounter with RMR:  05/03/2011   Allergy No Known Allergies  Current Outpatient Prescriptions  Medication Sig Dispense Refill  . Albuterol Sulfate (VENTOLIN HFA IN) Inhale into the lungs.        . Cholecalciferol (VITAMIN D3) 1000 UNITS CAPS Take 1 capsule by mouth daily.        . fluticasone (FLONASE) 50 MCG/ACT nasal spray Place 2 sprays into the nose daily.        Marland Kitchen gabapentin (NEURONTIN) 300 MG capsule Take 1 capsule (300 mg total) by mouth 3 (three) times daily.  30 capsule  0  . levothyroxine (SYNTHROID, LEVOTHROID) 75 MCG tablet TAKE 1 TABLET BY MOUTH EVERY DAY  30 tablet  2  . pantoprazole (PROTONIX) 40 MG tablet Take 1 tablet (40 mg total) by mouth daily.  30 tablet  11  . sertraline (ZOLOFT) 100 MG tablet TAKE 1 TABLET BY MOUTH EVERY DAY  30 tablet  4  . temazepam (RESTORIL) 15 MG capsule       . traMADol (ULTRAM) 50 MG tablet Take 1 tablet (50 mg total) by mouth every 6 (six) hours as needed.  30 tablet  3  . temazepam (RESTORIL) 15 MG capsule Take 1 capsule (15 mg total) by mouth at bedtime as needed for sleep.  30 capsule  3  . temazepam (RESTORIL) 15 MG capsule Take 1 capsule (15 mg total) by mouth at bedtime as needed for sleep.  30 capsule  3  . triazolam (HALCION) 0.25 MG tablet Take 1 tablet (0.25 mg total) by mouth at bedtime as needed.  30 tablet  1     Discontinued Meds:   There are no discontinued medications.  Patient Active Problem List  Diagnoses  . VITAMIN D DEFICIENCY  . HYPERTRIGLYCERIDEMIA  . Anxiety and depression  . Tobacco abuse  . ALLERGIC RHINITIS  . BACK PAIN, LUMBAR, CHRONIC  . GERD (gastroesophageal reflux disease)  . Scleroderma  . Chronic lung disease  . Hypothyroid  . Arteriosclerotic cardiovascular disease (ASCVD)    LABS Office Visit on 03/22/2011  Component Date Value  . Fecal Occult Blood, POC 03/22/2011 Negative      Results for this Opt Visit:     Results for orders placed in visit on 03/22/11  HEMOCCULT (POC) BLOOD/STOOL TEST (OFFICE-1 CARD)      Component Value Range   Fecal Occult Blood, POC Negative      EKG Orders placed during the hospital encounter of 05/02/10  . EKG  . EKG     Prior Assessment and Plan Problem List as of 05/05/2011  Cardiology Problems   HYPERTRIGLYCERIDEMIA   Arteriosclerotic cardiovascular disease (ASCVD)     Other   VITAMIN D DEFICIENCY   Anxiety and depression   Last Assessment & Plan Note   09/02/2010 Office Visit Signed 09/05/2010  5:52 PM by Kerri Perches, MD    Uncontrolled currently, but sertraline will help    Tobacco abuse   Last Assessment & Plan Note   11/17/2010 Office Visit Signed 11/21/2010  7:16 PM by Kerri Perches, MD    Unchanged , counseled to quit    ALLERGIC RHINITIS   Last Assessment & Plan Note   09/02/2010 Office Visit Signed 09/05/2010  5:51 PM by Kerri Perches, MD    Controlled, no change in medication     BACK PAIN, LUMBAR, CHRONIC   GERD (gastroesophageal reflux disease)   Scleroderma   Chronic lung disease   Hypothyroid       Imaging: No results found.   FRS Calculation: Score not calculated. Missing: Total Cholesterol

## 2011-05-05 NOTE — Assessment & Plan Note (Addendum)
Question of atypical mycobacteria raised by the radiologist interpreting her CT scan.  Patient advised to collect temperature determinations at home and to submit the list to Dr. Juanetta Gosling when she is evaluated by him.

## 2011-05-05 NOTE — Assessment & Plan Note (Signed)
The patient has coronary artery disease as documented by coronary calcification, but no known obstructive disease and no symptoms suggesting same.  We will proceed with an echocardiogram to reevaluate the pericardial effusion seen on CT and to assess left ventricular systolic function.  It is not clear that a stress test will be necessary.  Optimal control of cardiovascular risk factors would be highly desirable.

## 2011-05-05 NOTE — Assessment & Plan Note (Addendum)
Importance of discontinuing cigarette smoking was discussed with the patient.  She has already tapered usage from 2-3 packs to 0.5 packs/day.  If she has not completely stopped by her next visit, we will discuss alternate strategies.

## 2011-05-09 ENCOUNTER — Encounter: Payer: Self-pay | Admitting: Family Medicine

## 2011-05-09 ENCOUNTER — Ambulatory Visit (HOSPITAL_COMMUNITY)
Admission: RE | Admit: 2011-05-09 | Discharge: 2011-05-09 | Disposition: A | Discharge status: Home / Self Care | Payer: Medicare Other | Source: Ambulatory Visit | Attending: Cardiology | Admitting: Cardiology

## 2011-05-09 ENCOUNTER — Ambulatory Visit (INDEPENDENT_AMBULATORY_CARE_PROVIDER_SITE_OTHER): Payer: Medicare Other | Admitting: Family Medicine

## 2011-05-09 VITALS — BP 102/72 | HR 71 | Resp 16 | Ht 68.0 in | Wt 147.4 lb

## 2011-05-09 DIAGNOSIS — E039 Hypothyroidism, unspecified: Secondary | ICD-10-CM

## 2011-05-09 DIAGNOSIS — G8929 Other chronic pain: Secondary | ICD-10-CM

## 2011-05-09 DIAGNOSIS — R079 Chest pain, unspecified: Secondary | ICD-10-CM | POA: Insufficient documentation

## 2011-05-09 DIAGNOSIS — E785 Hyperlipidemia, unspecified: Secondary | ICD-10-CM

## 2011-05-09 DIAGNOSIS — M549 Dorsalgia, unspecified: Secondary | ICD-10-CM

## 2011-05-09 DIAGNOSIS — G4700 Insomnia, unspecified: Secondary | ICD-10-CM

## 2011-05-09 DIAGNOSIS — F17200 Nicotine dependence, unspecified, uncomplicated: Secondary | ICD-10-CM

## 2011-05-09 DIAGNOSIS — R55 Syncope and collapse: Secondary | ICD-10-CM | POA: Insufficient documentation

## 2011-05-09 DIAGNOSIS — Z720 Tobacco use: Secondary | ICD-10-CM

## 2011-05-09 DIAGNOSIS — R7301 Impaired fasting glucose: Secondary | ICD-10-CM

## 2011-05-09 DIAGNOSIS — F341 Dysthymic disorder: Secondary | ICD-10-CM

## 2011-05-09 DIAGNOSIS — R5381 Other malaise: Secondary | ICD-10-CM

## 2011-05-09 DIAGNOSIS — R072 Precordial pain: Secondary | ICD-10-CM

## 2011-05-09 DIAGNOSIS — F329 Major depressive disorder, single episode, unspecified: Secondary | ICD-10-CM

## 2011-05-09 MED ORDER — ALPRAZOLAM 0.5 MG PO TABS: ORAL_TABLET | ORAL | Status: DC

## 2011-05-09 MED ORDER — TEMAZEPAM 30 MG PO CAPS: 30.0000 mg | ORAL_CAPSULE | Freq: Every evening | ORAL | Status: DC | PRN

## 2011-05-09 NOTE — Patient Instructions (Signed)
F/u in 3 month, call if you need me before  You need tSH and HBA1C today.  Dose increase in temazepam , and additional medication to help with sleep.  Cut back by one cigarette each month please, start with 9 per day in May, you need to quit smoking

## 2011-05-09 NOTE — Progress Notes (Signed)
  Subjective:    Patient ID: Kelly Douglas, female    DOB: 11-28-1955, 56 y.o.   MRN: [id number]  HPI The PT is here for follow up and re-evaluation of chronic medical conditions, medication management and review of any available recent lab and radiology data.  Preventive health is updated, specifically  Cancer screening and Immunization.   Questions or concerns regarding consultations or procedures which the PT has had in the interim are  Addressed.Has had evaluation by pulmonary and has upcoming appt with cardiology The PT denies any adverse reactions to current medications since the last visit.  There are no new concerns.  C/o continued lack of sleep resulting in excessive fatigue    Review of Systems See HPI Denies recent fever or chills. Denies sinus pressure, nasal congestion, ear pain or sore throat. Denies chest congestion, productive cough or wheezing.chronic dyspnea Denies chest pains, palpitations and leg swelling Denies abdominal pain, nausea, vomiting,diarrhea or constipation.   Denies dysuria, frequency, hesitancy or incontinence. Chronic  joint pain, with no  limitation in mobility. Denies headaches, seizures, numbness, or tingling. Denies uncontrolled  Depression or  anxiety  Denies skin break down or rash.        Objective:   Physical Exam Patient alert and oriented and in no cardiopulmonary distress.  HEENT: No facial asymmetry, EOMI, no sinus tenderness,  oropharynx pink and moist.  Neck supple no adenopathy.  Chest: decreased air entry, scattered crackles, few wheezes  CVS: S1, S2 no murmurs, no S3.  ABD: Soft non tender. Bowel sounds normal.  Ext: No edema  MS: Adequate ROM spine, shoulders, hips and knees.  Skin: Intact, no ulcerations or rash noted.  Psych: Good eye contact, normal affect. Memory intact not anxious or depressed appearing.  CNS: CN 2-12 intact, power, tone and sensation normal throughout.        Assessment & Plan:

## 2011-05-09 NOTE — Progress Notes (Signed)
*  PRELIMINARY RESULTS* Echocardiogram 2D Echocardiogram has been performed.  Kelly Douglas 05/09/2011, 12:50 PM

## 2011-05-27 ENCOUNTER — Encounter (HOSPITAL_COMMUNITY): Payer: Self-pay | Admitting: Pharmacy Technician

## 2011-05-30 NOTE — Assessment & Plan Note (Signed)
Controlled, no change in medication  

## 2011-05-30 NOTE — Assessment & Plan Note (Signed)
Cessation counseling done 

## 2011-05-30 NOTE — Assessment & Plan Note (Signed)
Uncontrolled despite practice of good sleep hygiene. Increase dose of restoril and add additional medication

## 2011-05-30 NOTE — Assessment & Plan Note (Signed)
Unchanged and controlled on current med

## 2011-05-30 NOTE — Assessment & Plan Note (Signed)
Hyperlipidemia:Low fat diet discussed and encouraged.  Needs updated lab

## 2011-05-31 ENCOUNTER — Other Ambulatory Visit: Payer: Self-pay | Admitting: Obstetrics & Gynecology

## 2011-05-31 NOTE — Patient Instructions (Signed)
20 Kelly Douglas  05/31/2011   Your procedure is scheduled on: 5.22.13  Report to [facility] at 0700 AM.  Call this number if you have problems the morning of surgery: [phone]   Remember:   Do not eat food:After Midnight.  May have clear liquids:until Midnight .  Clear liquids include soda, tea, black coffee, apple or grape juice, broth.  Take these medicines the morning of surgery with A SIP OF WATER: xanax, synthroid, protonix, zoloft. Use your albuterol inhaler & bring it with you.   Do not wear jewelry, make-up or nail polish.  Do not wear lotions, powders, or perfumes. You may wear deodorant.  Do not shave 48 hours prior to surgery. Men may shave face and neck.  Do not bring valuables to the hospital.  Contacts, dentures or bridgework may not be worn into surgery.  Leave suitcase in the car. After surgery it may be brought to your room.  For patients admitted to the hospital, checkout time is 11:00 AM the day of discharge.   Patients discharged the day of surgery will not be allowed to drive home.  Name and phone number of your driver: family  Special Instructions: CHG Shower Use Special Wash: 1/2 bottle night before surgery and 1/2 bottle morning of surgery.   Please read over the following fact sheets that you were given: Pain Booklet, MRSA Information, Surgical Site Infection Prevention, Anesthesia Post-op Instructions and Care and Recovery After Surgery   PATIENT INSTRUCTIONS POST-ANESTHESIA  IMMEDIATELY FOLLOWING SURGERY:  Do not drive or operate machinery for the first twenty four hours after surgery.  Do not make any important decisions for twenty four hours after surgery or while taking narcotic pain medications or sedatives.  If you develop intractable nausea and vomiting or a severe headache please notify your doctor immediately.  FOLLOW-UP:  Please make an appointment with your surgeon as instructed. You do not need to follow up with anesthesia unless specifically  instructed to do so.  WOUND CARE INSTRUCTIONS (if applicable):  Keep a dry clean dressing on the anesthesia/puncture wound site if there is drainage.  Once the wound has quit draining you may leave it open to air.  Generally you should leave the bandage intact for twenty four hours unless there is drainage.  If the epidural site drains for more than 36-48 hours please call the anesthesia department.  QUESTIONS?:  Please feel free to call your physician or the hospital operator if you have any questions, and they will be happy to assist you.     [facility] [address] [place] [phone]      Bilateral Salpingo-Oophorectomy Removal of both fallopian tubes and ovaries is called a Bilateral Salpingo-oophorectomy (BSO). The fallopian tubes transport the egg from the ovary to the womb (uterus). The fallopian tube is also where the sperm and egg meet and become fertilized and move down into the uterus. Usually when a BSO is done, the uterus was previously removed. Removing both tubes and ovaries will:  Put you into the menopause. You will no longer have menstrual periods.   May cause you to have symptoms of menopause (hot flashes, night sweats, mood changes).   Not affect your sex drive or physical relationship.   Cause you to not be able to become pregnant (sterile).  LET YOUR CAREGIVER KNOW ABOUT:  Allergies to food or medication.   Medications taken including herbs, eye drops, over-the-counter medications, and creams.   Use of steroids (  by mouth or creams).   Previous problems with anesthetics or numbing medication.   Possibility of pregnancy, if this applies.   Your smoking habits   History of blood clots (thrombophlebitis).   History of bleeding or blood problems.   Previous surgeries.   Other health problems.  RISKS AND COMPLICATIONS All surgery is associated with risks. Some of these risks are:  Injury to surrounding organs.    Bleeding.   Infection.   Blood clots in the legs or lungs.   Problems with the anesthesia.   The surgery does not help the problem.   Death.  BEFORE THE PROCEDURE  Do not take aspirin or blood thinners because it can make you bleed.   Do not eat or drink anything at least 8 hours before the surgery.   Let your caregiver know if you develop a cold or an infection.   If you are being admitted the day of surgery, arrive at least 1 hour before the surgery to read and sign the necessary forms and consents.   Arrange for help when you go home from the hospital.   If you smoke, do not smoke for at least 2 weeks before the surgery.  PROCEDURE  You will change into a hospital gown. Then, you will be given an IV (intravenous) and a medication to relax you. You will be put to sleep with an anesthetic. Any hair on your lower belly (abdomen) will be removed, and a catheter will be placed in your bladder. The fallopian tubes and ovaries will be removed either through 2 very small cuts (incisions) or through large incision in the lower abdomen. AFTER THE PROCEDURE  You will be taken to the recovery room for 1 to 3 hours until your blood pressure, pulse, and temperature are stable and you are waking up.   If you had a laparoscopy, you may be discharged in several hours.   If you had a laparoscopy, you may have shoulder pain for a day or two from air left in the abdomen. The air can irritate the nerve that goes from the diaphragm to the shoulder.   You will be given pain medication as is necessary.   The intravenous and catheter will be removed.   Have someone available to take you home from the hospital.  HOME CARE INSTRUCTIONS   Only take over-the-counter or prescription medicines for pain, discomfort, or fever as directed by your caregiver.   Do not take aspirin. It can cause bleeding.   Do not drive when taking pain medication.   Follow your caregiver's advice regarding diet,  exercise, lifting, driving, and general activities.   You may resume your usual diet as directed and allowed.   Get plenty of rest and sleep.   Do not douche, use tampons, or have sexual intercourse until your caregiver says it is okay.   Change your bandages (dressings) as directed.   Take your temperature twice a day and write it down.   Your caregiver may recommend showers instead of baths for a few weeks.   Do not drink alcohol until your caregiver says it is okay.   If you develop constipation, you may take a mild laxative with your caregiver's permission. Bran foods and drinking fluids helps with constipation problems.   Try to have someone home with you for a week or two to help with the household activities.   Make sure you and your family understands everything about your operation and recovery.   Do  not sign any legal documents until you feel normal again.   Keep all your follow-up appointments.  SEEK MEDICAL CARE IF:   There is swelling, redness, or increasing pain in the wound area.   Pus is coming from the wound.   You notice a bad smell from the wound or surgical dressing.   You have pain, redness, or swelling from the intravenous site.   The wound is breaking open (the edges are not staying together).   You feel dizzy or feel like fainting.   You develop pain or bleeding when you urinate.   You develop diarrhea.   You develop nausea and vomiting.   You develop abnormal vaginal discharge.   You develop a rash.   You have any type of abnormal reaction or develop an allergy to your medication.   You need stronger pain medication for your pain.  SEEK IMMEDIATE MEDICAL CARE IF:   You develop an unexplained temperature above 100 F (37.8 C).   You develop abdominal pain.   You develop chest pain.   You develop shortness of breath.   You pass out.   You develop pain, swelling, or redness of your leg.   You develop heavy vaginal bleeding with  or without blood clots.  Document Released: 01/03/2005 Document Revised: 12/23/2010 Document Reviewed: 05/30/2008 [facility] Patient Information 2012 [place], [place].

## 2011-06-01 ENCOUNTER — Encounter (HOSPITAL_COMMUNITY)
Admission: RE | Admit: 2011-06-01 | Discharge: 2011-06-01 | Disposition: A | Discharge status: Home / Self Care | Payer: Medicare Other | Source: Ambulatory Visit | Attending: Obstetrics & Gynecology | Admitting: Obstetrics & Gynecology

## 2011-06-01 ENCOUNTER — Encounter (HOSPITAL_COMMUNITY): Payer: Self-pay

## 2011-06-01 ENCOUNTER — Other Ambulatory Visit: Payer: Self-pay

## 2011-06-01 HISTORY — DX: Depression, unspecified: F32.A

## 2011-06-01 HISTORY — DX: Anxiety disorder, unspecified: F41.9

## 2011-06-01 HISTORY — DX: Hyperlipidemia, unspecified: E78.5

## 2011-06-01 HISTORY — DX: Major depressive disorder, single episode, unspecified: F32.9

## 2011-06-01 LAB — CBC
Hemoglobin: 11.9 g/dL — ABNORMAL LOW (ref 12.0–15.0)
MCH: 25.1 pg — ABNORMAL LOW (ref 26.0–34.0)
RBC: 4.74 MIL/uL (ref 3.87–5.11)

## 2011-06-01 LAB — URINALYSIS, ROUTINE W REFLEX MICROSCOPIC
Bilirubin Urine: NEGATIVE
Ketones, ur: NEGATIVE mg/dL
Nitrite: NEGATIVE
Protein, ur: NEGATIVE mg/dL
Urobilinogen, UA: 0.2 mg/dL (ref 0.0–1.0)

## 2011-06-01 LAB — COMPREHENSIVE METABOLIC PANEL
ALT: 8 U/L (ref 0–35)
Alkaline Phosphatase: 117 U/L (ref 39–117)
CO2: 28 mEq/L (ref 19–32)
Calcium: 10.1 mg/dL (ref 8.4–10.5)
GFR calc Af Amer: 83 mL/min — ABNORMAL LOW (ref >90)
GFR calc non Af Amer: 72 mL/min — ABNORMAL LOW (ref >90)
Glucose, Bld: 87 mg/dL (ref 70–99)
Potassium: 4.4 mEq/L (ref 3.5–5.1)
Sodium: 140 mEq/L (ref 135–145)
Total Bilirubin: 0.3 mg/dL (ref 0.3–1.2)

## 2011-06-01 LAB — URINE MICROSCOPIC-ADD ON

## 2011-06-01 LAB — SURGICAL PCR SCREEN: MRSA, PCR: NEGATIVE

## 2011-06-06 ENCOUNTER — Ambulatory Visit (INDEPENDENT_AMBULATORY_CARE_PROVIDER_SITE_OTHER): Payer: Medicare Other | Admitting: Cardiology

## 2011-06-06 ENCOUNTER — Encounter: Payer: Self-pay | Admitting: Cardiology

## 2011-06-06 VITALS — BP 114/78 | HR 76 | Resp 16 | Ht 68.0 in | Wt 143.0 lb

## 2011-06-06 DIAGNOSIS — E039 Hypothyroidism, unspecified: Secondary | ICD-10-CM

## 2011-06-06 DIAGNOSIS — Z720 Tobacco use: Secondary | ICD-10-CM

## 2011-06-06 DIAGNOSIS — I2510 Atherosclerotic heart disease of native coronary artery without angina pectoris: Secondary | ICD-10-CM

## 2011-06-06 DIAGNOSIS — F17200 Nicotine dependence, unspecified, uncomplicated: Secondary | ICD-10-CM

## 2011-06-06 DIAGNOSIS — Z0189 Encounter for other specified special examinations: Secondary | ICD-10-CM

## 2011-06-06 DIAGNOSIS — E785 Hyperlipidemia, unspecified: Secondary | ICD-10-CM

## 2011-06-06 DIAGNOSIS — E559 Vitamin D deficiency, unspecified: Secondary | ICD-10-CM

## 2011-06-06 MED ORDER — NICOTINE 21 MG/24HR TD PT24: 1.0000 | MEDICATED_PATCH | TRANSDERMAL | Status: AC

## 2011-06-06 NOTE — Progress Notes (Deleted)
Name: Kelly Douglas    DOB: 25-Jul-1955  Age: 56 y.o.  MR#: [id number]       PCP:  Syliva Overman, MD, MD      Insurance: @PAYORNAME @   CC:    Chief Complaint  Patient presents with  . Appointment    no complaints -meds/list    VS BP 114/78  Pulse 76  Resp 16  Ht 5\' 8"  (1.727 m)  Wt 143 lb (64.864 kg)  BMI 21.74 kg/m2  Weights Current Weight  06/06/11 143 lb (64.864 kg)  06/01/11 147 lb (66.679 kg)  05/09/11 147 lb 6.4 oz (66.86 kg)    Blood Pressure  BP Readings from Last 3 Encounters:  06/06/11 114/78  06/01/11 138/87  05/09/11 102/72     Admit date:  (Not on file) Last encounter with RMR:  05/05/2011   Allergy No Known Allergies  Current Outpatient Prescriptions  Medication Sig Dispense Refill  . albuterol (PROVENTIL HFA;VENTOLIN HFA) 108 (90 BASE) MCG/ACT inhaler Inhale 2 puffs into the lungs every 6 (six) hours as needed. Shortness of breath      . Alcaftadine (LASTACAFT) 0.25 % SOLN Apply 1 drop to eye daily.      Marland Kitchen ALPRAZolam (XANAX) 0.5 MG tablet One tablet at bedtime  30 tablet  3  . Cholecalciferol (VITAMIN D3) 1000 UNITS CAPS Take 1 capsule by mouth daily.        . fluticasone (FLONASE) 50 MCG/ACT nasal spray Place 2 sprays into the nose as needed.       Marland Kitchen levothyroxine (SYNTHROID, LEVOTHROID) 75 MCG tablet TAKE 1 TABLET BY MOUTH EVERY DAY  30 tablet  2  . pantoprazole (PROTONIX) 40 MG tablet Take 1 tablet (40 mg total) by mouth daily.  30 tablet  11  . pravastatin (PRAVACHOL) 40 MG tablet Take 1 tablet (40 mg total) by mouth daily.  30 tablet  12  . sertraline (ZOLOFT) 100 MG tablet TAKE 1 TABLET BY MOUTH EVERY DAY  30 tablet  4  . traMADol (ULTRAM) 50 MG tablet Take 1 tablet (50 mg total) by mouth every 6 (six) hours as needed.  30 tablet  3  . triazolam (HALCION) 0.25 MG tablet Take 1 tablet (0.25 mg total) by mouth at bedtime as needed.  30 tablet  1    Discontinued Meds:    Medications Discontinued During This Encounter  Medication Reason  .  aspirin EC 81 MG tablet Error    Patient Active Problem List  Diagnoses  . VITAMIN D DEFICIENCY  . Hyperlipidemia  . Anxiety and depression  . Tobacco abuse  . ALLERGIC RHINITIS  . Chronic back pain  . GERD (gastroesophageal reflux disease)  . Scleroderma  . Chronic lung disease  . Hypothyroid  . Arteriosclerotic cardiovascular disease (ASCVD)  . Syncope  . Laboratory test  . Insomnia    [facility] Outpatient Visit on 06/01/2011  Component Date Value  . MRSA, PCR 06/01/2011 NEGATIVE   . Staphylococcus aureus 06/01/2011 NEGATIVE   . WBC 06/01/2011 10.7*  . RBC 06/01/2011 4.74   . Hemoglobin 06/01/2011 11.9*  . HCT 06/01/2011 36.0   . MCV 06/01/2011 75.9*  . [facility] 06/01/2011 25.1*  . MCHC 06/01/2011 33.1   . RDW 06/01/2011 15.5   . Platelets 06/01/2011 257   . Sodium 06/01/2011 140   . Potassium 06/01/2011 4.4   . Chloride 06/01/2011 104   . CO2 06/01/2011 28   . Glucose, Bld 06/01/2011 87   . BUN 06/01/2011  7   . Creatinine, Ser 06/01/2011 0.89   . Calcium 06/01/2011 10.1   . Total Protein 06/01/2011 7.3   . Albumin 06/01/2011 3.8   . AST 06/01/2011 12   . ALT 06/01/2011 8   . Alkaline Phosphatase 06/01/2011 117   . Total Bilirubin 06/01/2011 0.3   . GFR calc non Af Amer 06/01/2011 72*  . GFR calc Af Amer 06/01/2011 83*  . hCG, Beta Chain, Quant, S 06/01/2011 1   . Color, Urine 06/01/2011 YELLOW   . APPearance 06/01/2011 CLEAR   . Specific Gravity, Urine 06/01/2011 1.015   . pH 06/01/2011 6.0   . Glucose, UA 06/01/2011 NEGATIVE   . Hgb urine dipstick 06/01/2011 TRACE*  . Bilirubin Urine 06/01/2011 NEGATIVE   . Ketones, ur 06/01/2011 NEGATIVE   . Protein, ur 06/01/2011 NEGATIVE   . Urobilinogen, UA 06/01/2011 0.2   . Nitrite 06/01/2011 NEGATIVE   . Leukocytes, UA 06/01/2011 NEGATIVE   . Squamous Epithelial / LPF 06/01/2011 RARE   . RBC / HPF 06/01/2011 0-2   Office Visit on 05/09/2011  Component Date Value  . TSH 05/09/2011 2.618   . Hemoglobin A1C  05/09/2011 5.5   . Mean Plasma Glucose 05/09/2011 111   Office Visit on 03/22/2011  Component Date Value  . Fecal Occult Blood, POC 03/22/2011 Negative      Results for this Opt Visit:     Results for orders placed during the hospital encounter of 06/01/11  SURGICAL PCR SCREEN      Component Value Range   MRSA, PCR NEGATIVE  NEGATIVE    Staphylococcus aureus NEGATIVE  NEGATIVE   CBC      Component Value Range   WBC 10.7 (*) 4.0 - 10.5 (K/uL)   RBC 4.74  3.87 - 5.11 (MIL/uL)   Hemoglobin 11.9 (*) 12.0 - 15.0 (g/dL)   HCT 16.1  09.6 - 04.5 (%)   MCV 75.9 (*) 78.0 - 100.0 (fL)   MCH 25.1 (*) 26.0 - 34.0 (pg)   MCHC 33.1  30.0 - 36.0 (g/dL)   RDW 40.9  81.1 - 91.4 (%)   Platelets 257  150 - 400 (K/uL)  COMPREHENSIVE METABOLIC PANEL      Component Value Range   Sodium 140  135 - 145 (mEq/L)   Potassium 4.4  3.5 - 5.1 (mEq/L)   Chloride 104  96 - 112 (mEq/L)   CO2 28  19 - 32 (mEq/L)   Glucose, Bld 87  70 - 99 (mg/dL)   BUN 7  6 - 23 (mg/dL)   Creatinine, Ser 7.82  0.50 - 1.10 (mg/dL)   Calcium 95.6  8.4 - 10.5 (mg/dL)   Total Protein 7.3  6.0 - 8.3 (g/dL)   Albumin 3.8  3.5 - 5.2 (g/dL)   AST 12  0 - 37 (U/L)   ALT 8  0 - 35 (U/L)   Alkaline Phosphatase 117  39 - 117 (U/L)   Total Bilirubin 0.3  0.3 - 1.2 (mg/dL)   GFR calc non Af Amer 72 (*) >90 (mL/min)   GFR calc Af Amer 83 (*) >90 (mL/min)  HCG, QUANTITATIVE, PREGNANCY      Component Value Range   hCG, Beta Chain, Quant, S 1  <5 (mIU/mL)  URINALYSIS, ROUTINE W REFLEX MICROSCOPIC      Component Value Range   Color, Urine YELLOW  YELLOW    APPearance CLEAR  CLEAR    Specific Gravity, Urine 1.015  1.005 - 1.030  pH 6.0  5.0 - 8.0    Glucose, UA NEGATIVE  NEGATIVE (mg/dL)   Hgb urine dipstick TRACE (*) NEGATIVE    Bilirubin Urine NEGATIVE  NEGATIVE    Ketones, ur NEGATIVE  NEGATIVE (mg/dL)   Protein, ur NEGATIVE  NEGATIVE (mg/dL)   Urobilinogen, UA 0.2  0.0 - 1.0 (mg/dL)   Nitrite NEGATIVE  NEGATIVE     Leukocytes, UA NEGATIVE  NEGATIVE   URINE MICROSCOPIC-ADD ON      Component Value Range   Squamous Epithelial / LPF RARE  RARE    RBC / HPF 0-2  <3 (RBC/hpf)    EKG Orders placed during the hospital encounter of 06/01/11  . EKG 12-LEAD  . EKG 12-LEAD     Prior Assessment and Plan Problem List as of 06/06/2011          Cardiology Problems   Hyperlipidemia   Last Assessment & Plan Note   05/09/2011 Office Visit Signed 05/30/2011  4:12 AM by Kerri Perches, MD    Hyperlipidemia:Low fat diet discussed and encouraged.  Needs updated lab    Arteriosclerotic cardiovascular disease (ASCVD)   Last Assessment & Plan Note   05/05/2011 Office Visit Signed 05/05/2011  1:12 PM by Kathlen Brunswick, MD    The patient has coronary artery disease as documented by coronary calcification, but no known obstructive disease and no symptoms suggesting same.  We will proceed with an echocardiogram to reevaluate the pericardial effusion seen on CT and to assess left ventricular systolic function.  It is not clear that a stress test will be necessary.  Optimal control of cardiovascular risk factors would be highly desirable.    Syncope   Last Assessment & Plan Note   05/05/2011 Office Visit Signed 05/05/2011  1:16 PM by Kathlen Brunswick, MD    History is suggestive of a neurocardiogenic mechanism.  The importance of avoiding falls was discussed with appropriate behavioral measures described.  If episodes continue to be rare and not damaging, I would not be inclined to proceed with electrophysiology assessment.      Other   VITAMIN D DEFICIENCY   Anxiety and depression   Last Assessment & Plan Note   05/09/2011 Office Visit Signed 05/30/2011  4:14 AM by Kerri Perches, MD    Controlled, no change in medication     Tobacco abuse   Last Assessment & Plan Note   05/09/2011 Office Visit Signed 05/30/2011  4:11 AM by Kerri Perches, MD    Cessation counseling done    ALLERGIC RHINITIS   Last  Assessment & Plan Note   09/02/2010 Office Visit Signed 09/05/2010  5:51 PM by Kerri Perches, MD    Controlled, no change in medication     Chronic back pain   Last Assessment & Plan Note   05/09/2011 Office Visit Signed 05/30/2011  4:17 AM by Kerri Perches, MD    Unchanged and controlled on current med    GERD (gastroesophageal reflux disease)   Scleroderma   Chronic lung disease   Last Assessment & Plan Note   05/05/2011 Office Visit Addendum 05/08/2011  3:10 PM by Kathlen Brunswick, MD    Question of atypical mycobacteria raised by the radiologist interpreting her CT scan.  Patient advised to collect temperature determinations at home and to submit the list to Dr. Juanetta Gosling when she is evaluated by him.    Hypothyroid   Last Assessment & Plan Note   05/09/2011 Office Visit Signed  05/30/2011  4:10 AM by Kerri Perches, MD    Controlled, no change in medication     Laboratory test   Insomnia   Last Assessment & Plan Note   05/09/2011 Office Visit Signed 05/30/2011  4:12 AM by Kerri Perches, MD    Uncontrolled despite practice of good sleep hygiene. Increase dose of restoril and add additional medication        Imaging: No results found.   FRS Calculation: Score not calculated. Missing: Total Cholesterol

## 2011-06-06 NOTE — Assessment & Plan Note (Addendum)
The negative implications of continued cigarette smoking were discussed.  The patient has only stopped for up to one month in the past.  She points to recurrent stress as the factor that causes relapse.  She understands that she will likely not quit by tapering and is willing to try a quit attempt with the assistance of transdermal nicotine.  Instructions were provided to her.  I suspect she will ultimately need referral to a smoking cessation program.

## 2011-06-06 NOTE — Patient Instructions (Signed)
Your physician recommends that you schedule a follow-up appointment in: 8 months  Your physician has recommended you make the following change in your medication:  STOP SMOKING - Nicotine patch 21mg /day  Your physician recommends that you return for lab work in: Within the week

## 2011-06-06 NOTE — Progress Notes (Signed)
Patient ID: Kelly Douglas, female   DOB: [birth year], 56 y.o.   MRN: [id number]  HPI: Scheduled return visit for this nice woman with asymptomatic coronary artery disease detected by CT scan of the chest, multiple cardiovascular risk factors including tobacco abuse.  Since her last visit, she has made no additional progress in tapering cigarette smoking.  She has an ovarian cyst and is scheduled for bilateral salpingo-oophorectomy.  She has a lung nodule, that is chronic, and is being followed radiographically by Dr. Juanetta Gosling.  Her echocardiogram was entirely normal as was TSH and A1c.  She has a borderline anemia with a hemoglobin of 11.9, hematocrit 36 and an MCV of 76.  CMet was normal.  Prior to Admission medications   Medication Sig Start Date End Date Taking? Authorizing Provider  albuterol (PROVENTIL HFA;VENTOLIN HFA) 108 (90 BASE) MCG/ACT inhaler Inhale 2 puffs into the lungs every 6 (six) hours as needed. Shortness of breath   Yes Historical Provider, MD  Alcaftadine (LASTACAFT) 0.25 % SOLN Apply 1 drop to eye daily.   Yes Historical Provider, MD  ALPRAZolam Prudy Feeler) 0.5 MG tablet One tablet at bedtime 05/09/11 05/08/12 Yes Kerri Perches, MD  Cholecalciferol (VITAMIN D3) 1000 UNITS CAPS Take 1 capsule by mouth daily.     Yes Historical Provider, MD  fluticasone (FLONASE) 50 MCG/ACT nasal spray Place 2 sprays into the nose as needed.    Yes Historical Provider, MD  levothyroxine (SYNTHROID, LEVOTHROID) 75 MCG tablet TAKE 1 TABLET BY MOUTH EVERY DAY 03/17/11  Yes Kerri Perches, MD  pantoprazole (PROTONIX) 40 MG tablet Take 1 tablet (40 mg total) by mouth daily. 07/16/10 07/16/11 Yes Joselyn Arrow, NP  pravastatin (PRAVACHOL) 40 MG tablet Take 1 tablet (40 mg total) by mouth daily. 05/05/11 05/04/12 Yes Kathlen Brunswick, MD  sertraline (ZOLOFT) 100 MG tablet TAKE 1 TABLET BY MOUTH EVERY DAY 02/17/11  Yes Kerri Perches, MD  traMADol (ULTRAM) 50 MG tablet Take 1 tablet (50 mg total) by  mouth every 6 (six) hours as needed. 09/02/10  Yes Kerri Perches, MD  triazolam (HALCION) 0.25 MG tablet Take 1 tablet (0.25 mg total) by mouth at bedtime as needed. 03/22/11 04/21/11  Kerri Perches, MD  No Known Allergies    Past medical history, social history, and family history reviewed and updated.  ROS: Denies chest discomfort, orthopnea, PND, pedal edema, exertional dyspnea, lightheadedness or syncope.  All other systems reviewed and are negative.  PHYSICAL EXAM: BP 114/78  Pulse 76  Resp 16  Ht 5\' 8"  (1.727 m)  Wt 64.864 kg (143 lb)  BMI 21.74 kg/m2  General-Well developed; no acute distress; severe dental caries Body habitus-Mildly overweight Neck-No JVD; no carotid bruits Lungs-clear lung fields; resonant to percussion Cardiovascular-normal PMI; normal S1 and S2; modest systolic ejection murmur Abdomen-normal bowel sounds; soft and non-tender without masses or organomegaly Musculoskeletal-No deformities, no cyanosis or clubbing Neurologic-Normal cranial nerves; symmetric strength and tone Skin-Warm, no significant lesions Extremities-distal pulses intact; no edema  ASSESSMENT AND PLAN:  [place] Bing, MD 06/06/2011 2:19 PM

## 2011-06-06 NOTE — Assessment & Plan Note (Signed)
Coronary calcification with reasonable exercise tolerance and no symptoms to suggest myocardial ischemia.  Optimal control of cardiovascular risk factors will be our approach to this problem.

## 2011-06-06 NOTE — Assessment & Plan Note (Signed)
Lipid profile was somewhat suboptimal when last assessed in 08/2010, but she is now being treated with a statin.  A repeat profile will be obtained.

## 2011-06-08 ENCOUNTER — Encounter (HOSPITAL_COMMUNITY)
Admission: RE | Disposition: A | Discharge status: Home / Self Care | Payer: Self-pay | Source: Ambulatory Visit | Attending: Obstetrics & Gynecology

## 2011-06-08 ENCOUNTER — Ambulatory Visit (HOSPITAL_COMMUNITY): Payer: Medicare Other | Admitting: Anesthesiology

## 2011-06-08 ENCOUNTER — Encounter (HOSPITAL_COMMUNITY): Payer: Self-pay | Admitting: Anesthesiology

## 2011-06-08 ENCOUNTER — Ambulatory Visit (HOSPITAL_COMMUNITY)
Admission: RE | Admit: 2011-06-08 | Discharge: 2011-06-08 | Disposition: A | Discharge status: Home / Self Care | Payer: Medicare Other | Source: Ambulatory Visit | Attending: Obstetrics & Gynecology | Admitting: Obstetrics & Gynecology

## 2011-06-08 ENCOUNTER — Encounter (HOSPITAL_COMMUNITY): Payer: Self-pay | Admitting: *Deleted

## 2011-06-08 DIAGNOSIS — E119 Type 2 diabetes mellitus without complications: Secondary | ICD-10-CM | POA: Insufficient documentation

## 2011-06-08 DIAGNOSIS — E785 Hyperlipidemia, unspecified: Secondary | ICD-10-CM | POA: Insufficient documentation

## 2011-06-08 DIAGNOSIS — R1900 Intra-abdominal and pelvic swelling, mass and lump, unspecified site: Secondary | ICD-10-CM

## 2011-06-08 DIAGNOSIS — J4489 Other specified chronic obstructive pulmonary disease: Secondary | ICD-10-CM | POA: Insufficient documentation

## 2011-06-08 DIAGNOSIS — Z01810 Encounter for preprocedural cardiovascular examination: Secondary | ICD-10-CM | POA: Insufficient documentation

## 2011-06-08 DIAGNOSIS — J449 Chronic obstructive pulmonary disease, unspecified: Secondary | ICD-10-CM | POA: Insufficient documentation

## 2011-06-08 DIAGNOSIS — Z01812 Encounter for preprocedural laboratory examination: Secondary | ICD-10-CM | POA: Insufficient documentation

## 2011-06-08 DIAGNOSIS — D279 Benign neoplasm of unspecified ovary: Secondary | ICD-10-CM | POA: Insufficient documentation

## 2011-06-08 SURGERY — SALPINGO-OOPHORECTOMY, LAPAROSCOPIC
Anesthesia: General | Laterality: Bilateral | Wound class: Clean

## 2011-06-08 MED ORDER — KETOROLAC TROMETHAMINE 30 MG/ML IJ SOLN
30.0000 mg | Freq: Once | INTRAMUSCULAR | Status: AC
  Administered 2011-06-08: 30 mg via INTRAVENOUS

## 2011-06-08 MED ORDER — ONDANSETRON HCL 4 MG/2ML IJ SOLN
INTRAMUSCULAR | Status: AC
  Filled 2011-06-08: qty 2

## 2011-06-08 MED ORDER — LACTATED RINGERS IV SOLN
INTRAVENOUS | Status: DC
  Administered 2011-06-08: 1000 mL via INTRAVENOUS
  Administered 2011-06-08: 10:00:00 via INTRAVENOUS

## 2011-06-08 MED ORDER — GLYCOPYRROLATE 0.2 MG/ML IJ SOLN
INTRAMUSCULAR | Status: DC | PRN
  Administered 2011-06-08: .5 mg via INTRAVENOUS

## 2011-06-08 MED ORDER — ROCURONIUM BROMIDE 100 MG/10ML IV SOLN
INTRAVENOUS | Status: DC | PRN
  Administered 2011-06-08: 10 mg via INTRAVENOUS
  Administered 2011-06-08: 30 mg via INTRAVENOUS

## 2011-06-08 MED ORDER — 0.9 % SODIUM CHLORIDE (POUR BTL) OPTIME
TOPICAL | Status: DC | PRN
  Administered 2011-06-08: 1000 mL

## 2011-06-08 MED ORDER — FENTANYL CITRATE 0.05 MG/ML IJ SOLN
INTRAMUSCULAR | Status: DC | PRN
  Administered 2011-06-08 (×4): 50 ug via INTRAVENOUS

## 2011-06-08 MED ORDER — PROPOFOL 10 MG/ML IV BOLUS
INTRAVENOUS | Status: DC | PRN
  Administered 2011-06-08: 120 mg via INTRAVENOUS

## 2011-06-08 MED ORDER — ONDANSETRON HCL 4 MG/2ML IJ SOLN: 4.0000 mg | Freq: Once | INTRAMUSCULAR | Status: DC | PRN

## 2011-06-08 MED ORDER — CEFAZOLIN SODIUM 1-5 GM-% IV SOLN
INTRAVENOUS | Status: DC | PRN
  Administered 2011-06-08: 1 g via INTRAVENOUS

## 2011-06-08 MED ORDER — MIDAZOLAM HCL 2 MG/2ML IJ SOLN
INTRAMUSCULAR | Status: AC
  Administered 2011-06-08: 2 mg via INTRAVENOUS
  Filled 2011-06-08: qty 2

## 2011-06-08 MED ORDER — FENTANYL CITRATE 0.05 MG/ML IJ SOLN
INTRAMUSCULAR | Status: AC
  Administered 2011-06-08: 50 ug via INTRAVENOUS
  Filled 2011-06-08: qty 5

## 2011-06-08 MED ORDER — MIDAZOLAM HCL 2 MG/2ML IJ SOLN
1.0000 mg | INTRAMUSCULAR | Status: DC | PRN
  Administered 2011-06-08: 2 mg via INTRAVENOUS

## 2011-06-08 MED ORDER — CEFAZOLIN SODIUM 1-5 GM-% IV SOLN
INTRAVENOUS | Status: AC
  Filled 2011-06-08: qty 50

## 2011-06-08 MED ORDER — FENTANYL CITRATE 0.05 MG/ML IJ SOLN
25.0000 ug | INTRAMUSCULAR | Status: DC | PRN
  Administered 2011-06-08 (×2): 50 ug via INTRAVENOUS

## 2011-06-08 MED ORDER — LABETALOL HCL 5 MG/ML IV SOLN
INTRAVENOUS | Status: DC | PRN
  Administered 2011-06-08: 5 mg via INTRAVENOUS

## 2011-06-08 MED ORDER — GLYCOPYRROLATE 0.2 MG/ML IJ SOLN
INTRAMUSCULAR | Status: AC
  Filled 2011-06-08: qty 1

## 2011-06-08 MED ORDER — GLYCOPYRROLATE 0.2 MG/ML IJ SOLN
INTRAMUSCULAR | Status: AC
  Filled 2011-06-08: qty 2

## 2011-06-08 MED ORDER — CEFAZOLIN SODIUM 1-5 GM-% IV SOLN: 1.0000 g | INTRAVENOUS | Status: DC

## 2011-06-08 MED ORDER — NEOSTIGMINE METHYLSULFATE 1 MG/ML IJ SOLN
INTRAMUSCULAR | Status: DC | PRN
  Administered 2011-06-08: 3 mg via INTRAVENOUS

## 2011-06-08 MED ORDER — ROCURONIUM BROMIDE 50 MG/5ML IV SOLN
INTRAVENOUS | Status: AC
  Filled 2011-06-08: qty 1

## 2011-06-08 MED ORDER — KETOROLAC TROMETHAMINE 30 MG/ML IJ SOLN
INTRAMUSCULAR | Status: AC
  Administered 2011-06-08: 30 mg via INTRAVENOUS
  Filled 2011-06-08: qty 1

## 2011-06-08 MED ORDER — ONDANSETRON HCL 8 MG PO TABS: 8.0000 mg | ORAL_TABLET | Freq: Three times a day (TID) | ORAL | Status: AC | PRN

## 2011-06-08 MED ORDER — PROPOFOL 10 MG/ML IV EMUL
INTRAVENOUS | Status: AC
  Filled 2011-06-08: qty 20

## 2011-06-08 MED ORDER — KETOROLAC TROMETHAMINE 10 MG PO TABS: 10.0000 mg | ORAL_TABLET | Freq: Three times a day (TID) | ORAL | Status: AC | PRN

## 2011-06-08 MED ORDER — LIDOCAINE HCL 1 % IJ SOLN
INTRAMUSCULAR | Status: DC | PRN
  Administered 2011-06-08: 40 mg via INTRADERMAL

## 2011-06-08 MED ORDER — GLYCOPYRROLATE 0.2 MG/ML IJ SOLN
0.2000 mg | Freq: Once | INTRAMUSCULAR | Status: AC
  Administered 2011-06-08: 0.2 mg via INTRAVENOUS

## 2011-06-08 MED ORDER — LIDOCAINE HCL (PF) 1 % IJ SOLN
INTRAMUSCULAR | Status: AC
  Filled 2011-06-08: qty 5

## 2011-06-08 MED ORDER — NEOSTIGMINE METHYLSULFATE 1 MG/ML IJ SOLN
INTRAMUSCULAR | Status: AC
  Filled 2011-06-08: qty 10

## 2011-06-08 MED ORDER — FENTANYL CITRATE 0.05 MG/ML IJ SOLN
INTRAMUSCULAR | Status: AC
  Administered 2011-06-08: 50 ug via INTRAVENOUS
  Filled 2011-06-08: qty 2

## 2011-06-08 MED ORDER — HYDROCODONE-ACETAMINOPHEN 5-500 MG PO TABS: 1.0000 | ORAL_TABLET | Freq: Four times a day (QID) | ORAL | Status: AC | PRN

## 2011-06-08 SURGICAL SUPPLY — 37 items
APPLIER CLIP UNV 5X34 EPIX (ENDOMECHANICALS) ×2 IMPLANT
BAG HAMPER (MISCELLANEOUS) ×2 IMPLANT
BLADE SURG SZ11 CARB STEEL (BLADE) ×2 IMPLANT
CLOTH BEACON ORANGE TIMEOUT ST (SAFETY) ×2 IMPLANT
COVER LIGHT HANDLE STERIS (MISCELLANEOUS) ×4 IMPLANT
ELECT REM PT RETURN 9FT ADLT (ELECTROSURGICAL) ×2
ELECTRODE REM PT RTRN 9FT ADLT (ELECTROSURGICAL) ×1 IMPLANT
FILTER SMOKE EVAC LAPAROSHD (FILTER) ×2 IMPLANT
FORMALIN 10 PREFIL 120ML (MISCELLANEOUS) ×4 IMPLANT
GLOVE BIOGEL PI IND STRL 7.0 (GLOVE) ×1 IMPLANT
GLOVE BIOGEL PI IND STRL 8 (GLOVE) ×1 IMPLANT
GLOVE BIOGEL PI INDICATOR 7.0 (GLOVE) ×1
GLOVE BIOGEL PI INDICATOR 8 (GLOVE) ×1
GLOVE ECLIPSE 8.0 STRL XLNG CF (GLOVE) ×2 IMPLANT
GLOVE EXAM NITRILE PF MED BLUE (GLOVE) ×4 IMPLANT
GLOVE SS BIOGEL STRL SZ 6.5 (GLOVE) ×1 IMPLANT
GLOVE SUPERSENSE BIOGEL SZ 6.5 (GLOVE) ×1
INST SET LAPROSCOPIC GYN AP (KITS) ×2 IMPLANT
KIT ROOM TURNOVER APOR (KITS) ×2 IMPLANT
KIT TROCAR LAP GYN (TROCAR) ×2 IMPLANT
MANIFOLD NEPTUNE II (INSTRUMENTS) ×2 IMPLANT
NS IRRIG 1000ML POUR BTL (IV SOLUTION) ×2 IMPLANT
PACK PERI GYN (CUSTOM PROCEDURE TRAY) ×2 IMPLANT
PAD ARMBOARD 7.5X6 YLW CONV (MISCELLANEOUS) ×2 IMPLANT
POUCH SPECIMEN RETRIEVAL 10MM (ENDOMECHANICALS) ×4 IMPLANT
SCALPEL HARMONIC ACE (MISCELLANEOUS) ×2 IMPLANT
SET BASIN LINEN APH (SET/KITS/TRAYS/PACK) ×2 IMPLANT
SET TUBE IRRIG SUCTION NO TIP (IRRIGATION / IRRIGATOR) ×2 IMPLANT
SOLUTION ANTI FOG 6CC (MISCELLANEOUS) ×2 IMPLANT
SPONGE GAUZE 2X2 8PLY STRL LF (GAUZE/BANDAGES/DRESSINGS) ×6 IMPLANT
STAPLER VISISTAT 35W (STAPLE) ×2 IMPLANT
SUT VICRYL 0 UR6 27IN ABS (SUTURE) ×2 IMPLANT
SYRINGE 10CC LL (SYRINGE) ×2 IMPLANT
TAPE CLOTH SURG 4X10 WHT LF (GAUZE/BANDAGES/DRESSINGS) ×2 IMPLANT
TRAY FOLEY CATH 14FR (SET/KITS/TRAYS/PACK) ×2 IMPLANT
TUBING HI FLO HEAT INSUFFLATOR (IRRIGATION / IRRIGATOR) ×2 IMPLANT
WARMER LAPAROSCOPE (MISCELLANEOUS) ×2 IMPLANT

## 2011-06-08 NOTE — Op Note (Signed)
Preoperative diagnosis:  1.  Complex left pelvic mass,probably ovarian in origin                                         2.  normal preoperative CA 125  Postoperative diagnosis:  Same as above  Procedure:  Laparoscopic bilateral salpingo-oophorectomy  Surgeon:  Rockne Coons MD  Anesthesia:  Gen. Endotracheal  Findings:   The patient was evaluated for right lower quadrant pain and found to have an unusual pelvic mass on the left.  MRI revealed it to be fatty but ovarian most likely.  Her preoperative CA 125 was normal.  The right ovary was normal on scan.  Intraoperatively,  the right ovary appeared normal and the left ovary was minimally enlarged and was smooth with no excrescences .  This did not appear to be a malignant condition.  The peritoneal surfaces were normal.  The uterus was normal.  Description of operation:  The patient was taken to the operating room and placed in the supine position where she underwent general endotracheal anesthesia.  She was placed in the low lithotomy position.  She was prepped and draped in the usual sterile fashion and a Foley catheter was placed.  An incision was made in the umbilicus and a Veres needle was placed into the peritoneal cavity with one pass without difficulty.  The peritoneal cavity was then insufflated.  An 11 mm non-bladed direct visualization trocar was then used and placed into the peritoneal cavity with direct laparoscopic visualization again without difficulty.  Incisions were then made in the left lower quadrant and midline suprapubic region.  A 5 mm trocar was placed in the left lower quadrant and a 5 mm trocar was placed in the suprapubic incisions.  Both were done under direct visualization without difficulty.  The right ovary was grasped.  The harmonic scalpel was used and the infundibulopelvic ligament on the right was coagulated and then transected.  The remaining peritoneal attachments of the right ovary were also taken down  hemostatically.  The left ovary was identified and again the infundibulopelvic ligament on the left was coagulated and transected.  The left ovary was removed hemostatically as well.  An Endo Catch was placed in the right ovary was put in the bag and removed through the left lower quadrant incision without difficulty.  He left ovary was also removed without difficulty.   The fascia of the left lower quadrant incision was closed with a running 0 Vicryl. The subcutaneous tissue was then reapproximated with 0 Vicryl. The skin was closed using skin staples.  The umbilical fascia was closed with a single 0 Vicryl suture.  The subcutaneous tissue was reapproximated loosely using 0 Vicryl.  The skin was closed using skin staples.  The patient was awakened from anesthesia and taken to the recovery room in good stable condition with all counts being correct x3.  The estimated blood loss for the procedure was minimal.  There was no bleeding from the intraperitoneal surgery at all.  The patient received Ancef preoperatively.  She also received Toradol IV preoperatively.  She will be discharged from the recovery room time and seen next week for staple and suture removal.  Pedram Goodchild H 06/08/2011 9:39 AM   Shavanna Furnari H 12/01/2010 8:50 AM

## 2011-06-08 NOTE — Anesthesia Preprocedure Evaluation (Signed)
Anesthesia Evaluation  Patient identified by MRN, date of birth, ID band Patient awake    Reviewed: Allergy & Precautions, H&P , NPO status , Patient's Chart, lab work & pertinent test results  Airway Mallampati: II      Dental  (+) Poor Dentition, Chipped, Missing and Dental Advisory Given   Pulmonary COPDCurrent Smoker,  breath sounds clear to auscultation        Cardiovascular Rhythm:Regular     Neuro/Psych PSYCHIATRIC DISORDERS Anxiety Depression    GI/Hepatic GERD-  ,  Endo/Other  Diabetes mellitus-, Type 2Hypothyroidism   Renal/GU      Musculoskeletal   Abdominal   Peds  Hematology   Anesthesia Other Findings   Reproductive/Obstetrics                           Anesthesia Physical Anesthesia Plan  ASA: III  Anesthesia Plan: General   Post-op Pain Management:    Induction: Intravenous, Rapid sequence and Cricoid pressure planned  Airway Management Planned: Oral ETT  Additional Equipment:   Intra-op Plan:   Post-operative Plan: Extubation in OR  Informed Consent:   Plan Discussed with:   Anesthesia Plan Comments:         Anesthesia Quick Evaluation

## 2011-06-08 NOTE — H&P (Signed)
Kelly Douglas is an 56 y.o. female postmenopausal who had some right lower quadrant pain.  She was evaluated by Dr Lodema Hong and found to have an approximately 3 cm left pelvic mass, ?ovarian in origin.  MRI was consistent with a fatty lesion.  CA 125 is normal.  Discussed with patient and she feels most comfortable with surgical excision.  The right ovary is normal.  No LMP recorded. Patient is postmenopausal.    Past Medical History  Diagnosis Date  . GERD (gastroesophageal reflux disease)     Dr Jena Gauss EGD 09/2009->esophagitis, sm HH, antral erosions, atonic esophagus  . Seasonal allergies   . Arthritis   . Scleroderma   . Chronic lung disease     Chronic scarring and volume loss-left lung; characteristics of a chronic infectious process-possible MAI  . Chronic back pain   . Hypothyroid 1981 approx  . Arteriosclerotic cardiovascular disease (ASCVD) 2013    coronary calcification-left main, LAD and CX; small pericardial effusion  . Tobacco abuse 06/24/2009  . Syncope     Multiple spells over the past 40+ years, likely neurocardiogenic  . Diabetes mellitus     borderline  . Hyperlipidemia   . Depression   . Anxiety   . Chronic bronchitis     Past Surgical History  Procedure Date  . Tubal ligation     Family History  Problem Relation Age of Onset  . Throat cancer Brother   . Stroke Father   . Coronary artery disease Brother   . Down syndrome Brother   . Hypertension Brother     father/mother  . Anesthesia problems Neg Hx   . Hypotension Neg Hx   . Malignant hyperthermia Neg Hx   . Pseudochol deficiency Neg Hx     Social History:  reports that she has been smoking Cigarettes.  She has a 20 pack-year smoking history. She does not have any smokeless tobacco history on file. She reports that she drinks alcohol. She reports that she does not use illicit drugs.  Allergies: No Known Allergies  Prescriptions prior to admission  Medication Sig Dispense Refill  . albuterol  (PROVENTIL HFA;VENTOLIN HFA) 108 (90 BASE) MCG/ACT inhaler Inhale 2 puffs into the lungs every 6 (six) hours as needed. Shortness of breath      . Alcaftadine (LASTACAFT) 0.25 % SOLN Apply 1 drop to eye daily.      Marland Kitchen ALPRAZolam (XANAX) 0.5 MG tablet One tablet at bedtime  30 tablet  3  . Cholecalciferol (VITAMIN D3) 1000 UNITS CAPS Take 1 capsule by mouth daily.        . fluticasone (FLONASE) 50 MCG/ACT nasal spray Place 2 sprays into the nose as needed.       Marland Kitchen levothyroxine (SYNTHROID, LEVOTHROID) 75 MCG tablet TAKE 1 TABLET BY MOUTH EVERY DAY  30 tablet  2  . pantoprazole (PROTONIX) 40 MG tablet Take 1 tablet (40 mg total) by mouth daily.  30 tablet  11  . pravastatin (PRAVACHOL) 40 MG tablet Take 1 tablet (40 mg total) by mouth daily.  30 tablet  12  . sertraline (ZOLOFT) 100 MG tablet TAKE 1 TABLET BY MOUTH EVERY DAY  30 tablet  4  . traMADol (ULTRAM) 50 MG tablet Take 1 tablet (50 mg total) by mouth every 6 (six) hours as needed.  30 tablet  3  . nicotine (NICODERM CQ - DOSED IN MG/24 HOURS) 21 mg/24hr patch Place 1 patch onto the skin daily.  28 patch  0  .  triazolam (HALCION) 0.25 MG tablet Take 1 tablet (0.25 mg total) by mouth at bedtime as needed.  30 tablet  1    ROS  Review of Systems  Constitutional: Negative for fever, chills, weight loss, malaise/fatigue and diaphoresis.  HENT: Negative for hearing loss, ear pain, nosebleeds, congestion, sore throat, neck pain, tinnitus and ear discharge.   Eyes: Negative for blurred vision, double vision, photophobia, pain, discharge and redness.  Respiratory: Negative for cough, hemoptysis, sputum production, shortness of breath, wheezing and stridor.   Cardiovascular: Negative for chest pain, palpitations, orthopnea, claudication, leg swelling and PND.  Gastrointestinal: Positive for abdominal pain RLQ. Negative for heartburn, nausea, vomiting, diarrhea, constipation, blood in stool and melena.  Genitourinary: Negative for dysuria, urgency,  frequency, hematuria and flank pain.  Musculoskeletal: Negative for myalgias, back pain, joint pain and falls.  Skin: Negative for itching and rash.  Neurological: Negative for dizziness, tingling, tremors, sensory change, speech change, focal weakness, seizures, loss of consciousness, weakness and headaches.  Endo/Heme/Allergies: Negative for environmental allergies and polydipsia. Does not bruise/bleed easily.  Psychiatric/Behavioral: Negative for depression, suicidal ideas, hallucinations, memory loss and substance abuse. The patient is not nervous/anxious and does not have insomnia.      Blood pressure 125/85, pulse 76, temperature 98 F (36.7 C), temperature source Oral, resp. rate 22, SpO2 97.00%. Physical Exam Physical Exam  Vitals reviewed. Constitutional: She is oriented to person, place, and time. She appears well-developed and well-nourished.  HENT:  Head: Normocephalic and atraumatic.  Right Ear: External ear normal.  Left Ear: External ear normal.  Nose: Nose normal.  Mouth/Throat: Oropharynx is clear and moist.  Eyes: Conjunctivae and EOM are normal. Pupils are equal, round, and reactive to light. Right eye exhibits no discharge. Left eye exhibits no discharge. No scleral icterus.  Neck: Normal range of motion. Neck supple. No tracheal deviation present. No thyromegaly present.  Cardiovascular: Normal rate, regular rhythm, normal heart sounds and intact distal pulses.  Exam reveals no gallop and no friction rub.   No murmur heard. Respiratory: Effort normal and breath sounds normal. No respiratory distress. She has no wheezes. She has no rales. She exhibits no tenderness.  GI: Soft. Bowel sounds are normal. She exhibits no distension and no mass. There is tenderness. There is no rebound and no guarding.  Genitourinary:       Vulva is normal without lesions Vagina is pink moist without discharge Cervix normal in appearance and pap is normal Uterus is normal Adnexa is per  MRI and CT scan  Musculoskeletal: Normal range of motion. She exhibits no edema and no tenderness.  Neurological: She is alert and oriented to person, place, and time. She has normal reflexes. She displays normal reflexes. No cranial nerve deficit. She exhibits normal muscle tone. Coordination normal.  Skin: Skin is warm and dry. No rash noted. No erythema. No pallor.  Psychiatric: She has a normal mood and affect. Her behavior is normal. Judgment and thought content normal.   Recent Results (from the past 336 hour(s))  SURGICAL PCR SCREEN   Collection Time   06/01/11  8:32 AM      Component Value Range   MRSA, PCR NEGATIVE  NEGATIVE    Staphylococcus aureus NEGATIVE  NEGATIVE   URINALYSIS, ROUTINE W REFLEX MICROSCOPIC   Collection Time   06/01/11  8:33 AM      Component Value Range   Color, Urine YELLOW  YELLOW    APPearance CLEAR  CLEAR    Specific Gravity, Urine  1.015  1.005 - 1.030    pH 6.0  5.0 - 8.0    Glucose, UA NEGATIVE  NEGATIVE (mg/dL)   Hgb urine dipstick TRACE (*) NEGATIVE    Bilirubin Urine NEGATIVE  NEGATIVE    Ketones, ur NEGATIVE  NEGATIVE (mg/dL)   Protein, ur NEGATIVE  NEGATIVE (mg/dL)   Urobilinogen, UA 0.2  0.0 - 1.0 (mg/dL)   Nitrite NEGATIVE  NEGATIVE    Leukocytes, UA NEGATIVE  NEGATIVE   URINE MICROSCOPIC-ADD ON   Collection Time   06/01/11  8:33 AM      Component Value Range   Squamous Epithelial / LPF RARE  RARE    RBC / HPF 0-2  <3 (RBC/hpf)  CBC   Collection Time   06/01/11  9:00 AM      Component Value Range   WBC 10.7 (*) 4.0 - 10.5 (K/uL)   RBC 4.74  3.87 - 5.11 (MIL/uL)   Hemoglobin 11.9 (*) 12.0 - 15.0 (g/dL)   HCT 30.8  65.7 - 84.6 (%)   MCV 75.9 (*) 78.0 - 100.0 (fL)   MCH 25.1 (*) 26.0 - 34.0 (pg)   MCHC 33.1  30.0 - 36.0 (g/dL)   RDW 96.2  95.2 - 84.1 (%)   Platelets 257  150 - 400 (K/uL)  COMPREHENSIVE METABOLIC PANEL   Collection Time   06/01/11  9:00 AM      Component Value Range   Sodium 140  135 - 145 (mEq/L)   Potassium 4.4   3.5 - 5.1 (mEq/L)   Chloride 104  96 - 112 (mEq/L)   CO2 28  19 - 32 (mEq/L)   Glucose, Bld 87  70 - 99 (mg/dL)   BUN 7  6 - 23 (mg/dL)   Creatinine, Ser 3.24  0.50 - 1.10 (mg/dL)   Calcium 40.1  8.4 - 10.5 (mg/dL)   Total Protein 7.3  6.0 - 8.3 (g/dL)   Albumin 3.8  3.5 - 5.2 (g/dL)   AST 12  0 - 37 (U/L)   ALT 8  0 - 35 (U/L)   Alkaline Phosphatase 117  39 - 117 (U/L)   Total Bilirubin 0.3  0.3 - 1.2 (mg/dL)   GFR calc non Af Amer 72 (*) >90 (mL/min)   GFR calc Af Amer 83 (*) >90 (mL/min)  HCG, QUANTITATIVE, PREGNANCY   Collection Time   06/01/11  9:00 AM      Component Value Range   hCG, Beta Chain, Quant, S 1  <5 (mIU/mL)         Assessment/Plan: 1.  Left pelvic mass, ?origin and ?pathology  Patient is admitted for lap BSO for evaluation and treatment.  Pt understands the risks of surgery including but not limited t  excessive bleeding requiring transfusion or reoperation, post-operative infection requiring prolonged hospitalization or re-hospitalization and antibiotic therapy, and damage to other organs including bladder, bowel, ureters and major vessels.  The patient also understands the alternative treatment options which were discussed in full.  All questions were answered.   Ole Lafon H 06/08/2011, 8:27 AM

## 2011-06-08 NOTE — Discharge Instructions (Signed)
Laparoscopic Ovarian Torsion Surgery Care After Refer to this sheet in the next few weeks. These instructions provide you with information on caring for yourself after your procedure. Your caregiver may also give you more specific instructions. Your treatment has been planned according to current medical practices, but problems sometimes occur. Call your caregiver if you have any problems or questions after your procedure. HOME CARE INSTRUCTIONS  Take any medicine as directed by your caregiver. Follow the directions carefully.   Check your incisions every day.   Keep the incision area(s) dry. Ask your caregiver when it is safe to shower or bathe again.   Rest as much as possible for the next 3 days. Ask your caregiver when it is safe to go back to your normal activities.   Drink enough fluids to keep your urine clear or pale yellow.   Keep all follow-up appointments. Your caregiver will make sure you are healing the way you should be.  SEEK MEDICAL CARE IF:   You have bleeding or discharge from your vagina.   You have pain in your abdomen.   You feel nauseous.  SEEK IMMEDIATE MEDICAL CARE IF:   Your incision(s) becomes red, swollen, or tender.   Your incision(s) start(s) bleeding.   You have pus coming from any incision.   You have heavy or persistent vaginal bleeding or discharge.   You have severe or increased abdominal pain.   You cannot stop vomiting.   Your nausea will not go away.   You have a fever.  MAKE SURE YOU:  Understand these instructions.   Watch your condition.   Get help right away if you are not doing well or get worse.  Document Released: 12/23/2010 Document Reviewed: 12/21/2010 [employer] Patient Information 2012 [employer]. 

## 2011-06-08 NOTE — Anesthesia Procedure Notes (Signed)
Procedure Name: Intubation Date/Time: 06/08/2011 8:51 AM Performed by: Glynn Octave E Pre-anesthesia Checklist: Patient identified, Patient being monitored, Timeout performed, Emergency Drugs available and Suction available Patient Re-evaluated:Patient Re-evaluated prior to inductionOxygen Delivery Method: Circle System Utilized Preoxygenation: Pre-oxygenation with 100% oxygen Intubation Type: IV induction, Rapid sequence and Cricoid Pressure applied Ventilation: Mask ventilation without difficulty Laryngoscope Size: Mac and 3 Grade View: Grade I Tube type: Oral Tube size: 7.0 mm Number of attempts: 1 Airway Equipment and Method: stylet Placement Confirmation: ETT inserted through vocal cords under direct vision,  positive ETCO2 and breath sounds checked- equal and bilateral Secured at: 20 cm Tube secured with: Tape Dental Injury: Teeth and Oropharynx as per pre-operative assessment

## 2011-06-08 NOTE — Anesthesia Postprocedure Evaluation (Signed)
  Anesthesia Post-op Note  Patient: Kelly Douglas  Procedure(s) Performed: Procedure(s) (LRB): LAPAROSCOPIC SALPINGO OOPHERECTOMY (Bilateral)  Patient Location: PACU  Anesthesia Type: General  Level of Consciousness: awake  Airway and Oxygen Therapy: Patient Spontanous Breathing and Patient connected to face mask oxygen  Post-op Pain: none  Post-op Assessment: Post-op Vital signs reviewed, Patient's Cardiovascular Status Stable, Respiratory Function Stable, Patent Airway and No signs of Nausea or vomiting  Post-op Vital Signs: Reviewed and stable  Complications: No apparent anesthesia complications

## 2011-06-08 NOTE — Progress Notes (Signed)
Awake. C/O need to void. Refuses bedpan. Will go to BR when goes to postop.

## 2011-06-08 NOTE — Transfer of Care (Signed)
Immediate Anesthesia Transfer of Care Note  Patient: Kelly Douglas  Procedure(s) Performed: Procedure(s) (LRB): LAPAROSCOPIC SALPINGO OOPHERECTOMY (Bilateral)  Patient Location: PACU  Anesthesia Type: General  Level of Consciousness: sedated  Airway & Oxygen Therapy: Patient Spontanous Breathing and Patient connected to face mask oxygen  Post-op Assessment: Report given to PACU RN  Post vital signs: Reviewed and stable  Complications: No apparent anesthesia complications

## 2011-06-24 ENCOUNTER — Encounter: Payer: Self-pay | Admitting: Internal Medicine

## 2011-07-16 ENCOUNTER — Encounter (HOSPITAL_COMMUNITY): Payer: Self-pay | Admitting: Anesthesiology

## 2011-07-16 ENCOUNTER — Inpatient Hospital Stay (HOSPITAL_COMMUNITY): Payer: Medicare Other | Admitting: Anesthesiology

## 2011-07-16 ENCOUNTER — Encounter (HOSPITAL_COMMUNITY): Payer: Self-pay | Admitting: Emergency Medicine

## 2011-07-16 ENCOUNTER — Encounter (HOSPITAL_COMMUNITY)
Admission: EM | Disposition: A | Discharge status: Home / Self Care | Payer: Self-pay | Source: Home / Self Care | Attending: Emergency Medicine

## 2011-07-16 ENCOUNTER — Observation Stay (HOSPITAL_COMMUNITY)
Admission: EM | Admit: 2011-07-16 | Discharge: 2011-07-17 | Disposition: A | Discharge status: Home / Self Care | Payer: Medicare Other | Attending: General Surgery | Admitting: General Surgery

## 2011-07-16 ENCOUNTER — Emergency Department (HOSPITAL_COMMUNITY): Payer: Medicare Other

## 2011-07-16 DIAGNOSIS — K3580 Unspecified acute appendicitis: Secondary | ICD-10-CM

## 2011-07-16 DIAGNOSIS — E119 Type 2 diabetes mellitus without complications: Secondary | ICD-10-CM | POA: Insufficient documentation

## 2011-07-16 DIAGNOSIS — F419 Anxiety disorder, unspecified: Secondary | ICD-10-CM

## 2011-07-16 DIAGNOSIS — K37 Unspecified appendicitis: Principal | ICD-10-CM | POA: Insufficient documentation

## 2011-07-16 DIAGNOSIS — E785 Hyperlipidemia, unspecified: Secondary | ICD-10-CM | POA: Insufficient documentation

## 2011-07-16 DIAGNOSIS — J4489 Other specified chronic obstructive pulmonary disease: Secondary | ICD-10-CM | POA: Insufficient documentation

## 2011-07-16 DIAGNOSIS — F17200 Nicotine dependence, unspecified, uncomplicated: Secondary | ICD-10-CM | POA: Insufficient documentation

## 2011-07-16 DIAGNOSIS — J449 Chronic obstructive pulmonary disease, unspecified: Secondary | ICD-10-CM | POA: Insufficient documentation

## 2011-07-16 DIAGNOSIS — G4700 Insomnia, unspecified: Secondary | ICD-10-CM

## 2011-07-16 DIAGNOSIS — F341 Dysthymic disorder: Secondary | ICD-10-CM | POA: Insufficient documentation

## 2011-07-16 DIAGNOSIS — Z01810 Encounter for preprocedural cardiovascular examination: Secondary | ICD-10-CM | POA: Insufficient documentation

## 2011-07-16 HISTORY — PX: LAPAROSCOPIC APPENDECTOMY: SHX408

## 2011-07-16 LAB — COMPREHENSIVE METABOLIC PANEL
Alkaline Phosphatase: 125 U/L — ABNORMAL HIGH (ref 39–117)
BUN: 6 mg/dL (ref 6–23)
Chloride: 98 mEq/L (ref 96–112)
Creatinine, Ser: 0.72 mg/dL (ref 0.50–1.10)
GFR calc Af Amer: >90 mL/min (ref >90)
GFR calc non Af Amer: >90 mL/min (ref >90)
Glucose, Bld: 124 mg/dL — ABNORMAL HIGH (ref 70–99)
Potassium: 3.5 mEq/L (ref 3.5–5.1)
Total Bilirubin: 0.4 mg/dL (ref 0.3–1.2)

## 2011-07-16 LAB — URINALYSIS, ROUTINE W REFLEX MICROSCOPIC
Ketones, ur: NEGATIVE mg/dL
Leukocytes, UA: NEGATIVE
Nitrite: NEGATIVE
Protein, ur: NEGATIVE mg/dL
Urobilinogen, UA: 0.2 mg/dL (ref 0.0–1.0)

## 2011-07-16 LAB — CBC WITH DIFFERENTIAL/PLATELET
Eosinophils Relative: 0 % (ref 0–5)
HCT: 39.6 % (ref 36.0–46.0)
Lymphs Abs: 2.5 10*3/uL (ref 0.7–4.0)
MCH: 25.1 pg — ABNORMAL LOW (ref 26.0–34.0)
MCV: 74.7 fL — ABNORMAL LOW (ref 78.0–100.0)
Monocytes Absolute: 0.7 10*3/uL (ref 0.1–1.0)
Monocytes Relative: 5 % (ref 3–12)
Neutro Abs: 11.3 10*3/uL — ABNORMAL HIGH (ref 1.7–7.7)
RBC: 5.3 MIL/uL — ABNORMAL HIGH (ref 3.87–5.11)
RDW: 15.5 % (ref 11.5–15.5)
WBC: 14.5 10*3/uL — ABNORMAL HIGH (ref 4.0–10.5)

## 2011-07-16 SURGERY — APPENDECTOMY, LAPAROSCOPIC
Anesthesia: General

## 2011-07-16 SURGERY — APPENDECTOMY, LAPAROSCOPIC
Anesthesia: General | Site: Abdomen | Wound class: Contaminated

## 2011-07-16 MED ORDER — PROPOFOL 10 MG/ML IV EMUL
INTRAVENOUS | Status: AC
  Filled 2011-07-16: qty 20

## 2011-07-16 MED ORDER — SIMVASTATIN 10 MG PO TABS
20.0000 mg | ORAL_TABLET | Freq: Every day | ORAL | Status: DC
  Administered 2011-07-16 – 2011-07-17 (×2): 20 mg via ORAL
  Filled 2011-07-16 (×2): qty 2

## 2011-07-16 MED ORDER — HYDROCODONE-ACETAMINOPHEN 7.5-325 MG PO TABS
ORAL_TABLET | ORAL | Status: AC
  Filled 2011-07-16: qty 1

## 2011-07-16 MED ORDER — ALPRAZOLAM 0.5 MG PO TABS
0.5000 mg | ORAL_TABLET | Freq: Every day | ORAL | Status: DC
  Administered 2011-07-16: 0.5 mg via ORAL
  Filled 2011-07-16: qty 1

## 2011-07-16 MED ORDER — PANTOPRAZOLE SODIUM 40 MG PO TBEC
40.0000 mg | DELAYED_RELEASE_TABLET | Freq: Every day | ORAL | Status: DC
  Administered 2011-07-17: 40 mg via ORAL
  Filled 2011-07-16: qty 1

## 2011-07-16 MED ORDER — LIDOCAINE HCL (PF) 1 % IJ SOLN
INTRAMUSCULAR | Status: AC
  Filled 2011-07-16: qty 5

## 2011-07-16 MED ORDER — HYDROCODONE-ACETAMINOPHEN 5-325 MG PO TABS
1.0000 | ORAL_TABLET | ORAL | Status: DC | PRN
  Administered 2011-07-16 – 2011-07-17 (×3): 2 via ORAL
  Filled 2011-07-16 (×2): qty 2

## 2011-07-16 MED ORDER — HYDROMORPHONE HCL PF 1 MG/ML IJ SOLN
1.0000 mg | INTRAMUSCULAR | Status: DC | PRN
  Administered 2011-07-16: 1 mg via INTRAVENOUS
  Filled 2011-07-16: qty 1

## 2011-07-16 MED ORDER — LEVOTHYROXINE SODIUM 75 MCG PO TABS
75.0000 ug | ORAL_TABLET | Freq: Every day | ORAL | Status: DC
  Administered 2011-07-17: 75 ug via ORAL
  Filled 2011-07-16: qty 1

## 2011-07-16 MED ORDER — PANTOPRAZOLE SODIUM 40 MG IV SOLR
40.0000 mg | Freq: Once | INTRAVENOUS | Status: AC
  Administered 2011-07-16: 40 mg via INTRAVENOUS
  Filled 2011-07-16: qty 40

## 2011-07-16 MED ORDER — BUPIVACAINE HCL (PF) 0.5 % IJ SOLN
INTRAMUSCULAR | Status: AC
  Filled 2011-07-16: qty 30

## 2011-07-16 MED ORDER — IOHEXOL 300 MG/ML  SOLN: 40.0000 mL | Freq: Once | INTRAMUSCULAR | Status: DC | PRN

## 2011-07-16 MED ORDER — ONDANSETRON HCL 4 MG/2ML IJ SOLN
4.0000 mg | Freq: Three times a day (TID) | INTRAMUSCULAR | Status: AC | PRN
Start: 2011-07-16 — End: 2011-07-17

## 2011-07-16 MED ORDER — SUCCINYLCHOLINE CHLORIDE 20 MG/ML IJ SOLN
INTRAMUSCULAR | Status: AC
  Filled 2011-07-16: qty 1

## 2011-07-16 MED ORDER — FENTANYL CITRATE 0.05 MG/ML IJ SOLN
INTRAMUSCULAR | Status: DC | PRN
  Administered 2011-07-16 (×2): 50 ug via INTRAVENOUS
  Administered 2011-07-16: 100 ug via INTRAVENOUS
  Administered 2011-07-16: 50 ug via INTRAVENOUS

## 2011-07-16 MED ORDER — ONDANSETRON HCL 4 MG/2ML IJ SOLN
4.0000 mg | Freq: Once | INTRAMUSCULAR | Status: AC
  Administered 2011-07-16: 4 mg via INTRAVENOUS
  Filled 2011-07-16: qty 2

## 2011-07-16 MED ORDER — ALCAFTADINE 0.25 % OP SOLN: 1.0000 [drp] | Freq: Every day | OPHTHALMIC | Status: DC

## 2011-07-16 MED ORDER — NEOSTIGMINE METHYLSULFATE 1 MG/ML IJ SOLN
INTRAMUSCULAR | Status: DC | PRN
  Administered 2011-07-16: 4 mg via INTRAVENOUS

## 2011-07-16 MED ORDER — SUCCINYLCHOLINE CHLORIDE 20 MG/ML IJ SOLN
INTRAMUSCULAR | Status: DC | PRN
  Administered 2011-07-16: 100 mg via INTRAVENOUS

## 2011-07-16 MED ORDER — NEOSTIGMINE METHYLSULFATE 1 MG/ML IJ SOLN
INTRAMUSCULAR | Status: AC
  Filled 2011-07-16: qty 10

## 2011-07-16 MED ORDER — SODIUM CHLORIDE 0.9 % IV SOLN: INTRAVENOUS | Status: DC

## 2011-07-16 MED ORDER — SODIUM CHLORIDE 0.9 % IV SOLN
INTRAVENOUS | Status: DC | PRN
  Administered 2011-07-16: 20:00:00 via INTRAVENOUS

## 2011-07-16 MED ORDER — BUPIVACAINE HCL 0.5 % IJ SOLN
INTRAMUSCULAR | Status: DC | PRN
  Administered 2011-07-16: 10 mL

## 2011-07-16 MED ORDER — SODIUM CHLORIDE 0.9 % IR SOLN
Status: DC | PRN
  Administered 2011-07-16: 1000 mL

## 2011-07-16 MED ORDER — LIDOCAINE HCL (CARDIAC) 10 MG/ML IV SOLN
INTRAVENOUS | Status: DC | PRN
  Administered 2011-07-16: 50 mg via INTRAVENOUS

## 2011-07-16 MED ORDER — SODIUM CHLORIDE 0.9 % IV SOLN
1.0000 g | Freq: Once | INTRAVENOUS | Status: AC
  Administered 2011-07-16: 1 g via INTRAVENOUS
  Filled 2011-07-16: qty 1

## 2011-07-16 MED ORDER — PROPOFOL 10 MG/ML IV EMUL
INTRAVENOUS | Status: DC | PRN
  Administered 2011-07-16: 150 mg via INTRAVENOUS

## 2011-07-16 MED ORDER — GLYCOPYRROLATE 0.2 MG/ML IJ SOLN
INTRAMUSCULAR | Status: DC | PRN
  Administered 2011-07-16: 0.6 mg via INTRAVENOUS

## 2011-07-16 MED ORDER — FENTANYL CITRATE 0.05 MG/ML IJ SOLN
INTRAMUSCULAR | Status: AC
  Filled 2011-07-16: qty 5

## 2011-07-16 MED ORDER — IOHEXOL 300 MG/ML  SOLN
100.0000 mL | Freq: Once | INTRAMUSCULAR | Status: AC | PRN
  Administered 2011-07-16: 100 mL via INTRAVENOUS

## 2011-07-16 MED ORDER — HYDROCODONE-ACETAMINOPHEN 5-325 MG PO TABS
ORAL_TABLET | ORAL | Status: AC
  Filled 2011-07-16: qty 2

## 2011-07-16 MED ORDER — HYDROMORPHONE HCL PF 1 MG/ML IJ SOLN
1.0000 mg | Freq: Once | INTRAMUSCULAR | Status: AC
  Administered 2011-07-16: 1 mg via INTRAVENOUS
  Filled 2011-07-16: qty 1

## 2011-07-16 MED ORDER — SODIUM CHLORIDE 0.9 % IV SOLN
Freq: Once | INTRAVENOUS | Status: AC
  Administered 2011-07-16: 16:00:00 via INTRAVENOUS

## 2011-07-16 MED ORDER — TRIAZOLAM 0.125 MG PO TABS: 0.2500 mg | ORAL_TABLET | Freq: Every evening | ORAL | Status: DC | PRN

## 2011-07-16 MED ORDER — ROCURONIUM BROMIDE 50 MG/5ML IV SOLN
INTRAVENOUS | Status: AC
  Filled 2011-07-16: qty 1

## 2011-07-16 MED ORDER — GLYCOPYRROLATE 0.2 MG/ML IJ SOLN
INTRAMUSCULAR | Status: AC
  Filled 2011-07-16: qty 3

## 2011-07-16 MED ORDER — ROCURONIUM BROMIDE 100 MG/10ML IV SOLN
INTRAVENOUS | Status: DC | PRN
  Administered 2011-07-16: 5 mg via INTRAVENOUS
  Administered 2011-07-16: 15 mg via INTRAVENOUS

## 2011-07-16 MED ORDER — LACTATED RINGERS IV SOLN
INTRAVENOUS | Status: DC | PRN
  Administered 2011-07-16: 21:00:00 via INTRAVENOUS

## 2011-07-16 MED ORDER — ALBUTEROL SULFATE HFA 108 (90 BASE) MCG/ACT IN AERS: 2.0000 | INHALATION_SPRAY | Freq: Four times a day (QID) | RESPIRATORY_TRACT | Status: DC | PRN

## 2011-07-16 MED ORDER — SODIUM CHLORIDE 0.9 % IJ SOLN
INTRAMUSCULAR | Status: AC
  Filled 2011-07-16: qty 3

## 2011-07-16 MED ORDER — ALPRAZOLAM 0.5 MG PO TABS: 0.5000 mg | ORAL_TABLET | Freq: Two times a day (BID) | ORAL | Status: DC | PRN

## 2011-07-16 SURGICAL SUPPLY — 48 items
BAG HAMPER (MISCELLANEOUS) ×2 IMPLANT
BENZOIN TINCTURE PRP APPL 2/3 (GAUZE/BANDAGES/DRESSINGS) ×2 IMPLANT
CLOTH BEACON ORANGE TIMEOUT ST (SAFETY) ×2 IMPLANT
COVER LIGHT HANDLE STERIS (MISCELLANEOUS) ×4 IMPLANT
CUTTER ENDO LINEAR 45M (STAPLE) ×2 IMPLANT
CUTTER LINEAR ENDO 35 ETS (STAPLE) ×2 IMPLANT
DECANTER SPIKE VIAL GLASS SM (MISCELLANEOUS) ×2 IMPLANT
DEVICE TROCAR PUNCTURE CLOSURE (ENDOMECHANICALS) ×2 IMPLANT
DRSG TEGADERM 2-3/8X2-3/4 SM (GAUZE/BANDAGES/DRESSINGS) ×2 IMPLANT
DURAPREP 26ML APPLICATOR (WOUND CARE) ×2 IMPLANT
ELECT REM PT RETURN 9FT ADLT (ELECTROSURGICAL) ×2
ELECTRODE REM PT RTRN 9FT ADLT (ELECTROSURGICAL) ×1 IMPLANT
FILTER SMOKE EVAC LAPAROSHD (FILTER) ×2 IMPLANT
FORMALIN 10 PREFIL 120ML (MISCELLANEOUS) ×2 IMPLANT
GLOVE BIOGEL PI IND STRL 7.0 (GLOVE) ×2 IMPLANT
GLOVE BIOGEL PI IND STRL 7.5 (GLOVE) ×1 IMPLANT
GLOVE BIOGEL PI INDICATOR 7.0 (GLOVE) ×2
GLOVE BIOGEL PI INDICATOR 7.5 (GLOVE) ×1
GLOVE ECLIPSE 7.0 STRL STRAW (GLOVE) ×2 IMPLANT
GLOVE EXAM NITRILE MD LF STRL (GLOVE) ×4 IMPLANT
GLOVE OPTIFIT SS 6.5 STRL BRWN (GLOVE) ×2 IMPLANT
GOWN STRL REIN XL XLG (GOWN DISPOSABLE) ×6 IMPLANT
INST SET LAPROSCOPIC AP (KITS) ×2 IMPLANT
KIT ROOM TURNOVER APOR (KITS) ×2 IMPLANT
MANIFOLD NEPTUNE II (INSTRUMENTS) ×2 IMPLANT
NEEDLE INSUFFLATION 14GA 120MM (NEEDLE) ×2 IMPLANT
NS IRRIG 1000ML POUR BTL (IV SOLUTION) ×2 IMPLANT
PACK LAP CHOLE LZT030E (CUSTOM PROCEDURE TRAY) ×2 IMPLANT
PAD ARMBOARD 7.5X6 YLW CONV (MISCELLANEOUS) ×2 IMPLANT
POUCH SPECIMEN RETRIEVAL 10MM (ENDOMECHANICALS) ×2 IMPLANT
RELOAD 45 VASCULAR/THIN (ENDOMECHANICALS) IMPLANT
RELOAD CUTTER ETS 35MM STAND (ENDOMECHANICALS) IMPLANT
RELOAD STAPLE TA45 3.5 REG BLU (ENDOMECHANICALS) ×2 IMPLANT
SEALER TISSUE G2 CVD JAW 35 (ENDOMECHANICALS) ×1 IMPLANT
SEALER TISSUE G2 CVD JAW 45CM (ENDOMECHANICALS) ×1
SET BASIN LINEN APH (SET/KITS/TRAYS/PACK) ×2 IMPLANT
SET TUBE IRRIG SUCTION NO TIP (IRRIGATION / IRRIGATOR) IMPLANT
SLEEVE Z-THREAD 5X100MM (TROCAR) ×2 IMPLANT
SPONGE GAUZE 2X2 8PLY STRL LF (GAUZE/BANDAGES/DRESSINGS) ×2 IMPLANT
STRIP CLOSURE SKIN 1/2X4 (GAUZE/BANDAGES/DRESSINGS) ×2 IMPLANT
SUT MNCRL AB 4-0 PS2 18 (SUTURE) ×2 IMPLANT
SUT VIC AB 2-0 CT2 27 (SUTURE) ×4 IMPLANT
TRAY FOLEY CATH 14FR (SET/KITS/TRAYS/PACK) ×2 IMPLANT
TROCAR Z-THAD FIOS HNDL 12X100 (TROCAR) ×2 IMPLANT
TROCAR Z-THRD FIOS HNDL 11X100 (TROCAR) ×2 IMPLANT
TROCAR Z-THREAD FIOS 5X100MM (TROCAR) ×2 IMPLANT
TROCAR Z-THREAD SLEEVE 11X100 (TROCAR) ×2 IMPLANT
WARMER LAPAROSCOPE (MISCELLANEOUS) ×2 IMPLANT

## 2011-07-16 NOTE — ED Notes (Signed)
Report given to Berger, RN unit 300. Ready to receive patient.

## 2011-07-16 NOTE — H&P (Signed)
Kelly Douglas is an 56 y.o. female.   Chief Complaint: Abdominal pain HPI: Patient presented to [facility] emergency department with increasing right lower quadrant abdominal pain. Pain as she started last night it was diffuse in nature. It is now localized to the right lower quadrant. She has never had symptomatology similar to this in the past. She has had associated nausea but no emesis. No change in bowel movements. No melena no hematochezia. No change in urination. Patient did undergo recent gynecologic surgery via a laparoscope. Her incisions are at the umbilicus, suprapubic, and left lateral abdominal wall. These are well-healed.  Patient's appetite has been diminished.  No sick contacts. No unusual exposures.  Past Medical History  Diagnosis Date  . GERD (gastroesophageal reflux disease)     Dr Jena Gauss EGD 09/2009->esophagitis, sm HH, antral erosions, atonic esophagus  . Seasonal allergies   . Arthritis   . Scleroderma   . Chronic lung disease     Chronic scarring and volume loss-left lung; characteristics of a chronic infectious process-possible MAI  . Chronic back pain   . Hypothyroid 1981 approx  . Arteriosclerotic cardiovascular disease (ASCVD) 2013    coronary calcification-left main, LAD and CX; small pericardial effusion  . Tobacco abuse 06/24/2009  . Syncope     Multiple spells over the past 40+ years, likely neurocardiogenic  . Diabetes mellitus     borderline  . Hyperlipidemia   . Depression   . Anxiety   . Chronic bronchitis     Past Surgical History  Procedure Date  . Tubal ligation   . Partial hysterectomy     Family History  Problem Relation Age of Onset  . Throat cancer Brother   . Stroke Father   . Coronary artery disease Brother   . Down syndrome Brother   . Hypertension Brother     father/mother  . Anesthesia problems Neg Hx   . Hypotension Neg Hx   . Malignant hyperthermia Neg Hx   . Pseudochol deficiency Neg Hx    Social History:  reports that  she has been smoking Cigarettes.  She has a 20 pack-year smoking history. She does not have any smokeless tobacco history on file. She reports that she drinks alcohol. She reports that she does not use illicit drugs.  Allergies: No Known Allergies  Medications Prior to Admission  Medication Sig Dispense Refill  . albuterol (PROVENTIL HFA;VENTOLIN HFA) 108 (90 BASE) MCG/ACT inhaler Inhale 2 puffs into the lungs every 6 (six) hours as needed. Shortness of breath      . Alcaftadine (LASTACAFT) 0.25 % SOLN Apply 1 drop to eye daily.      Marland Kitchen ALPRAZolam (XANAX) 0.5 MG tablet Take 0.5 mg by mouth at bedtime.      . Cholecalciferol (VITAMIN D3) 1000 UNITS CAPS Take 1 capsule by mouth daily.        . fluticasone (FLONASE) 50 MCG/ACT nasal spray Place 2 sprays into the nose as needed. sinus      . HYDROcodone-acetaminophen (VICODIN) 5-500 MG per tablet Take 1 tablet by mouth every 6 (six) hours as needed. pain      . ketorolac (TORADOL) 10 MG tablet Take 10 mg by mouth every 8 (eight) hours as needed. pain      . levothyroxine (SYNTHROID, LEVOTHROID) 75 MCG tablet TAKE 1 TABLET BY MOUTH EVERY DAY  30 tablet  2  . pantoprazole (PROTONIX) 40 MG tablet Take 1 tablet (40 mg total) by mouth daily.  30 tablet  11  . pravastatin (PRAVACHOL) 40 MG tablet Take 1 tablet (40 mg total) by mouth daily.  30 tablet  12  . sertraline (ZOLOFT) 100 MG tablet TAKE 1 TABLET BY MOUTH EVERY DAY  30 tablet  4  . triazolam (HALCION) 0.25 MG tablet Take 1 tablet (0.25 mg total) by mouth at bedtime as needed.  30 tablet  1    Results for orders placed during the hospital encounter of 07/16/11 (from the past 48 hour(s))  CBC WITH DIFFERENTIAL     Status: Abnormal   Collection Time   07/16/11  4:00 PM      Component Value Range Comment   WBC 14.5 (*) 4.0 - 10.5 K/uL    RBC 5.30 (*) 3.87 - 5.11 MIL/uL    Hemoglobin 13.3  12.0 - 15.0 g/dL    HCT 16.1  09.6 - 04.5 %    MCV 74.7 (*) 78.0 - 100.0 fL    MCH 25.1 (*) 26.0 - 34.0 pg     MCHC 33.6  30.0 - 36.0 g/dL    RDW 40.9  81.1 - 91.4 %    Platelets 260  150 - 400 K/uL    Neutrophils Relative 78 (*) 43 - 77 %    Lymphocytes Relative 17  12 - 46 %    Monocytes Relative 5  3 - 12 %    Eosinophils Relative 0  0 - 5 %    Basophils Relative 0  0 - 1 %    Neutro Abs 11.3 (*) 1.7 - 7.7 K/uL    Lymphs Abs 2.5  0.7 - 4.0 K/uL    Monocytes Absolute 0.7  0.1 - 1.0 K/uL    Eosinophils Absolute 0.0  0.0 - 0.7 K/uL    Basophils Absolute 0.0  0.0 - 0.1 K/uL    WBC Morphology WHITE COUNT CONFIRMED ON SMEAR      Smear Review LARGE PLATELETS PRESENT     COMPREHENSIVE METABOLIC PANEL     Status: Abnormal   Collection Time   07/16/11  4:00 PM      Component Value Range Comment   Sodium 135  135 - 145 mEq/L    Potassium 3.5  3.5 - 5.1 mEq/L    Chloride 98  96 - 112 mEq/L    CO2 24  19 - 32 mEq/L    Glucose, Bld 124 (*) 70 - 99 mg/dL    BUN 6  6 - 23 mg/dL    Creatinine, Ser 7.82  0.50 - 1.10 mg/dL    Calcium 95.6  8.4 - 10.5 mg/dL    Total Protein 8.4 (*) 6.0 - 8.3 g/dL    Albumin 4.5  3.5 - 5.2 g/dL    AST 16  0 - 37 U/L    ALT 9  0 - 35 U/L    Alkaline Phosphatase 125 (*) 39 - 117 U/L    Total Bilirubin 0.4  0.3 - 1.2 mg/dL    GFR calc non Af Amer >90  >90 mL/min    GFR calc Af Amer >90  >90 mL/min   LIPASE, BLOOD     Status: Normal   Collection Time   07/16/11  4:00 PM      Component Value Range Comment   Lipase 15  11 - 59 U/L   URINALYSIS, ROUTINE W REFLEX MICROSCOPIC     Status: Abnormal   Collection Time   07/16/11  4:58 PM      Component Value Range  Comment   Color, Urine YELLOW  YELLOW    APPearance CLEAR  CLEAR    Specific Gravity, Urine 1.020  1.005 - 1.030    pH 8.5 (*) 5.0 - 8.0    Glucose, UA NEGATIVE  NEGATIVE mg/dL    Hgb urine dipstick NEGATIVE  NEGATIVE    Bilirubin Urine NEGATIVE  NEGATIVE    Ketones, ur NEGATIVE  NEGATIVE mg/dL    Protein, ur NEGATIVE  NEGATIVE mg/dL    Urobilinogen, UA 0.2  0.0 - 1.0 mg/dL    Nitrite NEGATIVE  NEGATIVE     Leukocytes, UA NEGATIVE  NEGATIVE MICROSCOPIC NOT DONE ON URINES WITH NEGATIVE PROTEIN, BLOOD, LEUKOCYTES, NITRITE, OR GLUCOSE <1000 mg/dL.   Ct Abdomen Pelvis W Contrast  07/16/2011  *RADIOLOGY REPORT*  Clinical Data: Abdominal pain.  CT ABDOMEN AND PELVIS WITH CONTRAST  Technique:  Multidetector CT imaging of the abdomen and pelvis was performed following the standard protocol during bolus administration of intravenous contrast.  Contrast: OMNIPAQUE IOHEXOL 300 MG/ML  SOLN  Comparison: CT of abdomen and pelvis 08/05/2009.  Findings:  Lung Bases: Patulous distal esophagus which is fluid-filled. Otherwise, unremarkable.  Abdomen/Pelvis:  The appearance of the liver, gallbladder, pancreas, spleen, bilateral adrenal glands and bilateral kidneys is unremarkable.  The appendix appears dilated and inflamed, with extensive periappendiceal stranding and a small amount of periappendiceal fluid tracking into the pelvis.  Image #44 of series 2 demonstrates a high attenuation structure in the proximal aspect of the appendix, likely to represent an obstructing appendicolith.  No definite periappendiceal abscess.  Free fluid in the anatomic pelvis does not appear organized.  No pneumoperitoneum.  No pathologic distension of bowel.  No definite pathologic lymphadenopathy identified within the abdomen or pelvis. Atherosclerosis of the abdominal and pelvic vasculature, without evidence of aneurysm or dissection.  Uterus and ovaries are unremarkable in appearance.  Urinary bladder is normal in appearance.  Musculoskeletal: There are no aggressive appearing lytic or blastic lesions noted in the visualized portions of the skeleton.  IMPRESSION: 1.  Findings, as above, consistent with acute appendicitis, likely secondary to an obstructing appendicolith in the proximal aspect of the appendix.  At this time, there is extensive periappendiceal stranding and a small amount of reactive fluid tracking inferiorly into the pelvis.  No  pneumoperitoneum.  No definite periappendiceal abscess at this time. 2.  Extensive atherosclerosis. 3.  Patulous distal esophagus which is fluid-filled.  This is similar to remote prior study from 08/05/2009.  Original Report Authenticated By: Florencia Reasons, M.D.    Review of Systems  Constitutional: Positive for fever and chills. Negative for weight loss, malaise/fatigue and diaphoresis.  HENT: Negative.   Eyes: Negative.   Respiratory: Negative.   Cardiovascular: Negative.   Gastrointestinal: Positive for heartburn, nausea and abdominal pain (right lower quadrant). Negative for vomiting, diarrhea, constipation, blood in stool and melena.  Genitourinary: Negative.   Musculoskeletal: Negative.   Skin: Negative.   Neurological: Negative.  Negative for weakness.  Endo/Heme/Allergies: Negative.   Psychiatric/Behavioral: Negative.     Blood pressure 160/115, pulse 65, temperature 97.9 F (36.6 C), temperature source Oral, resp. rate 18, height 5\' 8"  (1.727 m), weight 66.7 kg (147 lb 0.8 oz), SpO2 93.00%. Physical Exam  Constitutional: She is oriented to person, place, and time. She appears well-developed and well-nourished. No distress.  HENT:  Head: Normocephalic and atraumatic.  Eyes: EOM are normal. Pupils are equal, round, and reactive to light. No scleral icterus.  Neck: Normal range of motion.  No tracheal deviation present. No thyromegaly present.  Cardiovascular: Normal rate, regular rhythm and normal heart sounds.   Respiratory: Effort normal and breath sounds normal. No respiratory distress.  GI: Soft. She exhibits no distension and no mass. There is tenderness (positive right lower quadrant abdominal tenderness at McBurney's point. some referred pain is noted. No classic Rovsing sign however.). There is guarding. There is no rebound.  Musculoskeletal: Normal range of motion.  Lymphadenopathy:    She has no cervical adenopathy.  Neurological: She is alert and oriented to  person, place, and time.  Skin: Skin is warm and dry.     Assessment/Plan Acute appendicitis. CT findings were discussed with patient. Risks benefits alternatives a laparoscopic possible open appendectomy were discussed at length the patient including but not limited to risk of bleeding, infection, appendiceal stump leak, intraoperative cardiac and pulmonary events. Patient will be continued in an n.p.o. status. Continued on DVT prophylaxis. Pincus Sanes has been given in the emergency department for antibiotic coverage. Continue IV fluid hydration. We'll plan to proceed to the operating room for emergent laparoscopic appendectomy.  Marijah Larranaga C 07/16/2011, 7:45 PM

## 2011-07-16 NOTE — ED Notes (Signed)
Dr. Leticia Penna paged to Dr. Estell Harpin.

## 2011-07-16 NOTE — ED Provider Notes (Signed)
History   This chart was scribed for Benny Lennert, MD by Melba Coon. The patient was seen in room APA14/[facility]14 and the patient's care was started at 3:23PM.    CSN: [id number]  Arrival date & time 07/16/11  1511   First MD Initiated Contact with Patient 07/16/11 1518      Chief Complaint  Patient presents with  . Abdominal Pain    (Consider location/radiation/quality/duration/timing/severity/associated sxs/prior treatment) Patient is a 56 y.o. female presenting with abdominal pain. The history is provided by the patient. No language interpreter was used.  Abdominal Pain The primary symptoms of the illness include abdominal pain (moderate to severe) and nausea. The primary symptoms of the illness do not include fever, shortness of breath, vomiting, diarrhea or dysuria. The current episode started 6 to 12 hours ago. The onset of the illness was sudden. The problem has not changed since onset. Associated with: nothing. The patient states that she believes she is currently not pregnant. The patient has not had a change in bowel habit. Additional symptoms associated with the illness include chills. Symptoms associated with the illness do not include back pain. Associated medical issues comments: Pt recently had a hysterectomy in the last month. .  Hydrocodone did not alleviate the pain. Last BM: right before exam. Pt has never had gall bladder checked out. No known allergies. No other pertinent medical symptoms.  PCP: Dr. Lodema Hong   Past Medical History  Diagnosis Date  . GERD (gastroesophageal reflux disease)     Dr Jena Gauss EGD 09/2009->esophagitis, sm HH, antral erosions, atonic esophagus  . Seasonal allergies   . Arthritis   . Scleroderma   . Chronic lung disease     Chronic scarring and volume loss-left lung; characteristics of a chronic infectious process-possible MAI  . Chronic back pain   . Hypothyroid 1981 approx  . Arteriosclerotic cardiovascular disease (ASCVD) 2013   coronary calcification-left main, LAD and CX; small pericardial effusion  . Tobacco abuse 06/24/2009  . Syncope     Multiple spells over the past 40+ years, likely neurocardiogenic  . Diabetes mellitus     borderline  . Hyperlipidemia   . Depression   . Anxiety   . Chronic bronchitis     Past Surgical History  Procedure Date  . Tubal ligation   . Partial hysterectomy     Family History  Problem Relation Age of Onset  . Throat cancer Brother   . Stroke Father   . Coronary artery disease Brother   . Down syndrome Brother   . Hypertension Brother     father/mother  . Anesthesia problems Neg Hx   . Hypotension Neg Hx   . Malignant hyperthermia Neg Hx   . Pseudochol deficiency Neg Hx     History  Substance Use Topics  . Smoking status: Current Everyday Smoker -- 0.5 packs/day for 40 years    Types: Cigarettes  . Smokeless tobacco: Not on file  . Alcohol Use: Yes     quit June 2012-used to drink 6 pack daily    OB History    Grav Para Term Preterm Abortions TAB SAB Ect Mult Living   2 2 2       2       Review of Systems  Constitutional: Positive for chills. Negative for fever.       10 Systems reviewed and are negative for acute change except as noted in the HPI.  HENT: Negative for rhinorrhea.   Eyes: Negative for discharge and  redness.  Respiratory: Negative for cough and shortness of breath.   Cardiovascular: Negative for chest pain.  Gastrointestinal: Positive for nausea and abdominal pain (moderate to severe). Negative for vomiting, diarrhea and abdominal distention.  Genitourinary: Negative for dysuria.  Musculoskeletal: Negative for back pain.  Skin: Negative for rash.  Neurological: Negative for dizziness, syncope, speech difficulty, weakness, numbness and headaches.  Psychiatric/Behavioral: Negative for suicidal ideas, hallucinations and confusion.  All other systems reviewed and are negative.   Allergies  Review of patient's allergies indicates no  known allergies.  Home Medications   Current Outpatient Rx  Name Route Sig Dispense Refill  . ALBUTEROL SULFATE HFA 108 (90 BASE) MCG/ACT IN AERS Inhalation Inhale 2 puffs into the lungs every 6 (six) hours as needed. Shortness of breath    . ALCAFTADINE 0.25 % OP SOLN Ophthalmic Apply 1 drop to eye daily.    Marland Kitchen ALPRAZOLAM 0.5 MG PO TABS Oral Take 0.5 mg by mouth at bedtime.    Marland Kitchen VITAMIN D3 1000 UNITS PO CAPS Oral Take 1 capsule by mouth daily.      Marland Kitchen FLUTICASONE PROPIONATE 50 MCG/ACT NA SUSP Nasal Place 2 sprays into the nose as needed. sinus    . HYDROCODONE-ACETAMINOPHEN 5-500 MG PO TABS Oral Take 1 tablet by mouth every 6 (six) hours as needed. pain    . KETOROLAC TROMETHAMINE 10 MG PO TABS Oral Take 10 mg by mouth every 8 (eight) hours as needed. pain    . LEVOTHYROXINE SODIUM 75 MCG PO TABS  TAKE 1 TABLET BY MOUTH EVERY DAY 30 tablet 2  . PANTOPRAZOLE SODIUM 40 MG PO TBEC Oral Take 1 tablet (40 mg total) by mouth daily. 30 tablet 11  . PRAVASTATIN SODIUM 40 MG PO TABS Oral Take 1 tablet (40 mg total) by mouth daily. 30 tablet 12  . SERTRALINE HCL 100 MG PO TABS  TAKE 1 TABLET BY MOUTH EVERY DAY 30 tablet 4  . TRIAZOLAM 0.25 MG PO TABS Oral Take 1 tablet (0.25 mg total) by mouth at bedtime as needed. 30 tablet 1    BP 148/82  Pulse 71  Temp 97.6 F (36.4 C) (Oral)  Resp 17  Ht 5\' 8"  (1.727 m)  Wt 147 lb (66.679 kg)  BMI 22.35 kg/m2  SpO2 96%  Physical Exam  Nursing note and vitals reviewed. Constitutional: She is oriented to person, place, and time. She appears well-developed and well-nourished. No distress.  HENT:  Head: Normocephalic and atraumatic.  Right Ear: External ear normal.  Left Ear: External ear normal.  Eyes: EOM are normal.  Neck: Normal range of motion. No tracheal deviation present.  Cardiovascular: Normal rate, regular rhythm and normal heart sounds.   No murmur heard. Pulmonary/Chest: Effort normal. No respiratory distress.  Abdominal: Soft. She  exhibits no distension. There is tenderness (Moderate RUQ tenderness). There is no rebound.  Musculoskeletal: Normal range of motion. She exhibits no edema and no tenderness.  Neurological: She is alert and oriented to person, place, and time.  Skin: Skin is warm and dry. She is not diaphoretic.  Psychiatric: She has a normal mood and affect. Her behavior is normal.    ED Course  Procedures (including critical care time)  DIAGNOSTIC STUDIES: Oxygen Saturation is 99% on room air, normal by my interpretation.    COORDINATION OF CARE:  3:28PM - EDMD will order dilaudid, zofran, protonix, abd CT, blood w/u, and UA for the pt. 5:49PM - EDMD reviews imaging results; pt has appendicitis; pt will  be recommended to f/u with gastroenterologist/PCP; pt ready for d/c.   Labs Reviewed  CBC WITH DIFFERENTIAL - Abnormal; Notable for the following:    WBC 14.5 (*)     RBC 5.30 (*)     MCV 74.7 (*)     MCH 25.1 (*)     Neutrophils Relative 78 (*)     Neutro Abs 11.3 (*)     All other components within normal limits  COMPREHENSIVE METABOLIC PANEL - Abnormal; Notable for the following:    Glucose, Bld 124 (*)     Total Protein 8.4 (*)     Alkaline Phosphatase 125 (*)     All other components within normal limits  URINALYSIS, ROUTINE W REFLEX MICROSCOPIC - Abnormal; Notable for the following:    pH 8.5 (*)     All other components within normal limits  LIPASE, BLOOD   Ct Abdomen Pelvis W Contrast  07/16/2011  *RADIOLOGY REPORT*  Clinical Data: Abdominal pain.  CT ABDOMEN AND PELVIS WITH CONTRAST  Technique:  Multidetector CT imaging of the abdomen and pelvis was performed following the standard protocol during bolus administration of intravenous contrast.  Contrast: OMNIPAQUE IOHEXOL 300 MG/ML  SOLN  Comparison: CT of abdomen and pelvis 08/05/2009.  Findings:  Lung Bases: Patulous distal esophagus which is fluid-filled. Otherwise, unremarkable.  Abdomen/Pelvis:  The appearance of the liver,  gallbladder, pancreas, spleen, bilateral adrenal glands and bilateral kidneys is unremarkable.  The appendix appears dilated and inflamed, with extensive periappendiceal stranding and a small amount of periappendiceal fluid tracking into the pelvis.  Image #44 of series 2 demonstrates a high attenuation structure in the proximal aspect of the appendix, likely to represent an obstructing appendicolith.  No definite periappendiceal abscess.  Free fluid in the anatomic pelvis does not appear organized.  No pneumoperitoneum.  No pathologic distension of bowel.  No definite pathologic lymphadenopathy identified within the abdomen or pelvis. Atherosclerosis of the abdominal and pelvic vasculature, without evidence of aneurysm or dissection.  Uterus and ovaries are unremarkable in appearance.  Urinary bladder is normal in appearance.  Musculoskeletal: There are no aggressive appearing lytic or blastic lesions noted in the visualized portions of the skeleton.  IMPRESSION: 1.  Findings, as above, consistent with acute appendicitis, likely secondary to an obstructing appendicolith in the proximal aspect of the appendix.  At this time, there is extensive periappendiceal stranding and a small amount of reactive fluid tracking inferiorly into the pelvis.  No pneumoperitoneum.  No definite periappendiceal abscess at this time. 2.  Extensive atherosclerosis. 3.  Patulous distal esophagus which is fluid-filled.  This is similar to remote prior study from 08/05/2009.  Original Report Authenticated By: Florencia Reasons, M.D.     No diagnosis found.    MDM   The chart was scribed for me under my direct supervision.  I personally performed the history, physical, and medical decision making and all procedures in the evaluation of this patient.Benny Lennert, MD 07/16/11 1759

## 2011-07-16 NOTE — Anesthesia Postprocedure Evaluation (Signed)
  Anesthesia Post-op Note  Patient: Kelly Douglas  Procedure(s) Performed: Procedure(s) (LRB): APPENDECTOMY LAPAROSCOPIC (N/A)  Patient Location: PACU  Anesthesia Type: General  Level of Consciousness: awake, alert , oriented and patient cooperative  Airway and Oxygen Therapy: Patient Spontanous Breathing and Patient connected to face mask oxygen  Post-op Pain: mild  Post-op Assessment: Post-op Vital signs reviewed, Patient's Cardiovascular Status Stable, Respiratory Function Stable and Patent Airway  Post-op Vital Signs: Reviewed and stable  Complications: No apparent anesthesia complications

## 2011-07-16 NOTE — Preoperative (Signed)
Beta Blockers   Reason not to administer Beta Blockers:Not Applicable 

## 2011-07-16 NOTE — Anesthesia Preprocedure Evaluation (Addendum)
Anesthesia Evaluation  Patient identified by MRN, date of birth, ID band Patient awake    Reviewed: Allergy & Precautions, H&P , NPO status , Patient's Chart, lab work & pertinent test results  Airway Mallampati: I TM Distance: >3 FB Neck ROM: full    Dental  (+) Poor Dentition and Dental Advidsory Given   Pulmonary COPD COPD inhaler,  breath sounds clear to auscultation        Cardiovascular + CAD and + Peripheral Vascular Disease Rhythm:regular Rate:Normal     Neuro/Psych Anxiety Depression    GI/Hepatic GERD-  Controlled,  Endo/Other  Diabetes mellitus-Hypothyroidism   Renal/GU      Musculoskeletal   Abdominal   Peds  Hematology   Anesthesia Other Findings   Reproductive/Obstetrics                          Anesthesia Physical Anesthesia Plan  ASA: II and Emergent  Anesthesia Plan: General   Post-op Pain Management:    Induction: Intravenous, Rapid sequence and Cricoid pressure planned  Airway Management Planned: Oral ETT  Additional Equipment:   Intra-op Plan:   Post-operative Plan: Extubation in OR  Informed Consent: I have reviewed the patients History and Physical, chart, labs and discussed the procedure including the risks, benefits and alternatives for the proposed anesthesia with the patient or authorized representative who has indicated his/her understanding and acceptance.   Dental advisory given  Plan Discussed with: Anesthesiologist and Surgeon  Anesthesia Plan Comments:         Anesthesia Quick Evaluation

## 2011-07-16 NOTE — Transfer of Care (Signed)
Immediate Anesthesia Transfer of Care Note  Patient: Kelly Douglas  Procedure(s) Performed: Procedure(s) (LRB): APPENDECTOMY LAPAROSCOPIC (N/A)  Patient Location: PACU  Anesthesia Type: General  Level of Consciousness: awake, alert , oriented and patient cooperative  Airway & Oxygen Therapy: Patient Spontanous Breathing and Patient connected to face mask oxygen  Post-op Assessment: Report given to PACU RN and Post -op Vital signs reviewed and stable  Post vital signs: Reviewed and stable  Complications: No apparent anesthesia complications

## 2011-07-16 NOTE — ED Notes (Signed)
Patient c/o right lower quad abd pain that started this morning. Reports nausea, denies any vomiting or diarrhea. Per patient last BM today. Patient reports taking hydrocodone this morning with no relief.

## 2011-07-16 NOTE — ED Notes (Signed)
Attempted to call report; RN in room and will call back.

## 2011-07-16 NOTE — ED Notes (Signed)
Dr Zammit at bedside. 

## 2011-07-16 NOTE — Anesthesia Procedure Notes (Signed)
Procedure Name: Intubation Date/Time: 07/16/2011 8:31 PM Performed by: Carolyne Littles, Lilliane Sposito L Pre-anesthesia Checklist: Patient identified, Patient being monitored, Timeout performed, Emergency Drugs available and Suction available Patient Re-evaluated:Patient Re-evaluated prior to inductionOxygen Delivery Method: Circle System Utilized Preoxygenation: Pre-oxygenation with 100% oxygen Intubation Type: IV induction, Rapid sequence and Cricoid Pressure applied Laryngoscope Size: 3 and Miller Grade View: Grade I Tube type: Oral Tube size: 7.0 mm Number of attempts: 1 Airway Equipment and Method: stylet Placement Confirmation: ETT inserted through vocal cords under direct vision,  positive ETCO2 and breath sounds checked- equal and bilateral Secured at: 21 cm Tube secured with: Tape Dental Injury: Teeth and Oropharynx as per pre-operative assessment

## 2011-07-16 NOTE — ED Notes (Signed)
Patient ambulatory to restroom with steady gait.

## 2011-07-17 MED ORDER — NICOTINE 21 MG/24HR TD PT24
21.0000 mg | MEDICATED_PATCH | Freq: Every day | TRANSDERMAL | Status: DC
  Administered 2011-07-17 (×2): 21 mg via TRANSDERMAL
  Filled 2011-07-17 (×2): qty 1

## 2011-07-17 MED ORDER — MORPHINE SULFATE 4 MG/ML IJ SOLN
4.0000 mg | Freq: Four times a day (QID) | INTRAMUSCULAR | Status: DC | PRN
  Administered 2011-07-17: 4 mg via INTRAVENOUS
  Administered 2011-07-17: 2 mg via INTRAVENOUS
  Filled 2011-07-17 (×2): qty 1

## 2011-07-17 MED ORDER — OXYCODONE-ACETAMINOPHEN 5-325 MG PO TABS
1.0000 | ORAL_TABLET | ORAL | Status: DC | PRN
  Administered 2011-07-17: 1 via ORAL
  Filled 2011-07-17: qty 1

## 2011-07-17 MED ORDER — SODIUM CHLORIDE 0.9 % IJ SOLN
INTRAMUSCULAR | Status: AC
  Filled 2011-07-17: qty 3

## 2011-07-17 NOTE — Progress Notes (Signed)
Ambulated in hallway without difficulty.No c/o pain or discomfort noted.

## 2011-07-17 NOTE — Addendum Note (Signed)
Addendum  created 07/17/11 1621 by Marolyn Hammock, CRNA   Modules edited:Notes Section

## 2011-07-17 NOTE — Anesthesia Postprocedure Evaluation (Signed)
  Anesthesia Post-op Note  Patient: Kelly Douglas  Procedure(s) Performed: Procedure(s) (LRB): APPENDECTOMY LAPAROSCOPIC (N/A)  Patient Location:Room 302  Anesthesia Type: General  Level of Consciousness: awake, alert , oriented and patient cooperative  Airway and Oxygen Therapy: Patient Spontanous Breathing  Post-op Pain: moderate  Post-op Assessment: Post-op Vital signs reviewed, Patient's Cardiovascular Status Stable, Respiratory Function Stable, Patent Airway and No signs of Nausea or vomiting  Post-op Vital Signs: Reviewed and stable  Complications: No apparent anesthesia complications

## 2011-07-17 NOTE — Progress Notes (Signed)
Patient was discharged with instructions given on medications,and follow up visits,patient verbalized understanding.Accompanied by staff to an awaiting vehicle.

## 2011-07-18 NOTE — Op Note (Signed)
Patient:  Kelly Douglas  DOB:  04/19/1955  MRN:  [id number]   Preop Diagnosis:  Acute appendicitis  Postop Diagnosis:  The same  Procedure:  Laparoscopic appendectomy  Surgeon:  Dr. Tilford Pillar  Anes:  General endotracheal, 0.5% Sensorcaine plain for local  Indications:   Patient is a 56 year old female presented to [facility] emergency department with right lower quadrant abdominal pain. Workup and evaluation was consistent for acute appendicitis. Risks benefits alternatives a laparoscopic possible open appendectomy were discussed at length the patient including but not limited to risk of bleeding, infection, appendiceal stump leak, intraoperative cardiac component events. Patient's questions and concerns are addressed the patient was consented for the planned procedure.  Procedure note:  Patient is taken to the operator is placed in a supine position the or table time the general anesthetic is a Optician, dispensing. Once patient was asleep she was endotracheally intubated by the nurse anesthetist. At this point a Foley catheter is placed in standard sterile fashion by the operative staff. Her abdomen is prepped with DuraPrep solution and draped in standard fashion. A stab incision was created supraumbilically with 11 blade scalpel with additional dissection down to subcuticular tissue carried out using a Coker clamp. This is utilized to grasp the anterior normal fascia and lift this anteriorly. A Veress needle is inserted. Saline drops as utilized confirm intraperitoneal placement and then pneumoperitoneum was initiated. Once sufficient pneumoperitoneum was obtained an 12 mm insert overlap scope allowing visualization the trocar entering into the peritoneal cavity. At this point the inner cannulas removed lap strips reinserted there is no evidence a trocar or Veress the placement injury. At this time the remaining trochars replaced a 5 mm trocar in the suprapubic region, and a 11 mm trocar in the left  lateral abdominal wall.  Patient's placed into a Trendelenburg left lateral decubitus position. The tinea the cecum identified and followed down to the base of the appendix. The appendix is grasped. It is noted to be hyperemic. A window was created between the appendix and mesoappendix. The mesoappendix is then divided distally with the Enseal bipolar device. With the mesoappendix divided the base of the appendix is ligated and divided using a Endo GIA 45 standard stapler. At this point the appendix free and placed into an Endo Catch bag. Inspection of the staple line and mesoappendix indicate excellent hemostasis. At this time attention was turned to closure.  Using a Endo Close suture passing device a 2-0 Vicryl sutures passed both the 12 and 11 mm trocar sites. With the sutures in place the appendix is retrieved was removed through the umbilical trocar site and intact Endo Catch bag. It is placed in the back table sent as a permanent specimen to pathology. At this point the pneumoperitoneum was evacuated. The trochars were removed. The Vicryl sutures were secured. The local anesthetic was instilled. A 4-0 Monocryl utilized reapproximate the skin edges at all 3 trocar sites. The skin was washed dried moist dry towel. Benzoin is applied and half-inch are suture placed. The patient was allowed to come out of general anesthetic and stretcher the PACU stable condition. At the conclusion of procedure all instrument, sponge, needle counts are correct. Patient tolerated procedure extremely well.  Complications:  None apparent  EBL:  Minimal  Specimen:  Appendix

## 2011-07-19 NOTE — Progress Notes (Signed)
UR Chart Review Completed  

## 2011-07-20 ENCOUNTER — Encounter (HOSPITAL_COMMUNITY): Payer: Self-pay | Admitting: General Surgery

## 2011-08-10 ENCOUNTER — Ambulatory Visit (INDEPENDENT_AMBULATORY_CARE_PROVIDER_SITE_OTHER): Payer: Medicare Other | Admitting: Family Medicine

## 2011-08-10 ENCOUNTER — Encounter: Payer: Self-pay | Admitting: Family Medicine

## 2011-08-10 VITALS — BP 140/80 | HR 106 | Resp 18 | Ht 68.0 in | Wt 143.0 lb

## 2011-08-10 DIAGNOSIS — K219 Gastro-esophageal reflux disease without esophagitis: Secondary | ICD-10-CM

## 2011-08-10 DIAGNOSIS — F341 Dysthymic disorder: Secondary | ICD-10-CM

## 2011-08-10 DIAGNOSIS — E785 Hyperlipidemia, unspecified: Secondary | ICD-10-CM

## 2011-08-10 DIAGNOSIS — Z720 Tobacco use: Secondary | ICD-10-CM

## 2011-08-10 DIAGNOSIS — M549 Dorsalgia, unspecified: Secondary | ICD-10-CM

## 2011-08-10 DIAGNOSIS — F329 Major depressive disorder, single episode, unspecified: Secondary | ICD-10-CM

## 2011-08-10 DIAGNOSIS — E039 Hypothyroidism, unspecified: Secondary | ICD-10-CM

## 2011-08-10 DIAGNOSIS — F17200 Nicotine dependence, unspecified, uncomplicated: Secondary | ICD-10-CM

## 2011-08-10 DIAGNOSIS — G8929 Other chronic pain: Secondary | ICD-10-CM

## 2011-08-10 MED ORDER — GABAPENTIN 300 MG PO CAPS: 300.0000 mg | ORAL_CAPSULE | Freq: Three times a day (TID) | ORAL | Status: DC

## 2011-08-10 MED ORDER — OXYCODONE-ACETAMINOPHEN 5-325 MG PO TABS: ORAL_TABLET | ORAL | Status: DC

## 2011-08-10 MED ORDER — PANTOPRAZOLE SODIUM 40 MG PO TBEC: 40.0000 mg | DELAYED_RELEASE_TABLET | Freq: Every day | ORAL | Status: DC

## 2011-08-10 MED ORDER — LEVOTHYROXINE SODIUM 75 MCG PO TABS: 75.0000 ug | ORAL_TABLET | Freq: Every day | ORAL | Status: DC

## 2011-08-10 NOTE — Patient Instructions (Addendum)
Annual wellness exam in 2.5 month  You need to stop smoking to reduce your risk of heart disease, stroke and cancer  Medication for back  Pain is oxycodone ONE daily and gabapentin 3 times daily. You sign a pain contract and get pain medication only from thiis office.   Stop tramadol and vicodin   Protonix sent in for 2 months, but you need to make appt with Dr Kendell Bane.  I hope that you continue to improve

## 2011-08-14 NOTE — Assessment & Plan Note (Signed)
Controlled, no change in medication  

## 2011-08-14 NOTE — Progress Notes (Signed)
  Subjective:    Patient ID: Kelly Douglas, female    DOB: 05-12-1955, 56 y.o.   MRN: [id number]  HPI The PT is here for follow up and re-evaluation of chronic medical conditions, medication management and review of any available recent lab and radiology data.  Preventive health is updated, specifically  Cancer screening and Immunization.   Since her last visit she had scheduled gyne surgery , and this was followed by emergency appendectomy. She is understandably somewhat overwhelmed with all that has been going on with her but is weathering the storm well. Has concerns about chronic pain management , wants to change her current medications, and this is addressed also       Review of Systems See HPI Denies recent fever or chills. Denies sinus pressure, nasal congestion, ear pain or sore throat. Denies chest congestion, productive cough or wheezing. Denies chest pains, palpitations and leg swelling Denies abdominal pain, nausea, vomiting,diarrhea or constipation.   Denies dysuria, frequency, hesitancy or incontinence.  Denies headaches, seizures, numbness, or tingling. Denies depression,has increased  anxiety , insomnia. Is controlled with her medication. Denies skin break down or rash.        Objective:   Physical Exam  Patient alert and oriented and in no cardiopulmonary distress.  HEENT: No facial asymmetry, EOMI, no sinus tenderness,  oropharynx pink and moist.  Neck supple no adenopathy.  Chest: Clear to auscultation bilaterally.decreased air entry bilaterally  CVS: S1, S2 no murmurs, no S3.  ABD: Soft non tender. Bowel sounds normal.  Ext: No edema  MS: Adequate though reduced  ROM spine, shoulders, hips and knees.  Skin: Intact, no ulcerations or rash noted.  Psych: Good eye contact, normal affect. Memory intact mildly  anxious not  depressed appearing.  CNS: CN 2-12 intact, power,  normal throughout.       Assessment & Plan:

## 2011-08-14 NOTE — Assessment & Plan Note (Signed)
Unchanged. Patient counseled for approximately 5 minutes regarding the health risks of ongoing nicotine use, specifically all types of cancer, heart disease, stroke and respiratory failure. The options available for help with cessation ,the behavioral changes to assist the process, and the option to either gradully reduce usage  Or abruptly stop.is also discussed. Pt is also encouraged to set specific goals in number of cigarettes used daily, as well as to set a quit date.  

## 2011-08-14 NOTE — Assessment & Plan Note (Signed)
Hyperlipidemia:Low fat diet discussed and encouraged.  Updated lab needed 

## 2011-08-14 NOTE — Assessment & Plan Note (Signed)
Reports the oxycodone she was on recently for her surgical pain was more effective than vicodin. Will change with pain contract in place, and also add gabapentin

## 2011-08-14 NOTE — Assessment & Plan Note (Signed)
Improved on current medication, she has ahd a lot of health challenges since last visit, gyne surgery then appendectomy, soshe has some increased anxiety

## 2011-08-17 NOTE — Discharge Summary (Signed)
Physician Discharge Summary  Patient ID: Kelly Douglas MRN: [id number] DOB/AGE: 56-Nov-1957 56 y.o.  Admit date: 07/16/2011 Discharge date: 07/17/2011  Admission Diagnoses: Acute appendicitis  Discharge Diagnoses: The same Active Problems:  * No active hospital problems. *    Discharged Condition: stable  Hospital Course: Patient presented to [facility] with right lower quadrant bowel pain. Workup and evaluation was consistent for acute appendicitis. Patient was taken to the operating room for the planned procedure. She tolerated this well. Was advanced on her diet. On 07/17/2011 patient was comfortable. She is tolerating regular diet. She is pain was controlled oral analgesia. Patient was planned for discharge to home.  Consults: None  Significant Diagnostic Studies: radiology: CT scan: Abdomen and pelvis  Treatments: IV hydration and surgery: Laparoscopic appendectomy  Discharge Exam: Blood pressure 142/72, pulse 85, temperature 98 F (36.7 C), temperature source Oral, resp. rate 18, height 5\' 8"  (1.727 m), weight 66.7 kg (147 lb 0.8 oz), SpO2 100.00%. General appearance: alert and no distress Resp: clear to auscultation bilaterally Cardio: regular rate and rhythm GI: Positive bowel sounds, soft, expected postoperative tenderness. Incisions are clean dry and intact  Disposition: 01-Home or Self Care  Discharge Orders    Future Appointments: Provider: Department: Dept Phone: Center:   10/12/2011 1:45 PM Kerri Perches, MD [facility] [phone] [facility]     Future Orders Please Complete By Expires   Diet - low sodium heart healthy      Increase activity slowly      Discharge instructions      Comments:   Increase activity as tolerated. May place ice pack for comfort.  Alternate an anti-inflammatory such as ibuprofen (Motrin, Advil) 400-600mg  every 6 hours with the prescribed pain medication.   Do not take any additional acetaminophen as there is Tylenol  in the pain medication.   Driving Restrictions      Comments:   No driving while on pain medications.   Lifting restrictions      Comments:   No lifting over 20lbs for 4-5 weeks post-op.   Discharge wound care:      Comments:   Clean surgical sites with soap and water.  May shower the morning after surgery unless instructed by Dr. Leticia Penna otherwise.  No soaking for 2-3 weeks.    If adhesive strips are in place, they may be removed in 1-2 weeks while in the shower.   Call MD for:  temperature >100.4      Call MD for:  persistant nausea and vomiting      Call MD for:  severe uncontrolled pain      Call MD for:  redness, tenderness, or signs of infection (pain, swelling, redness, odor or green/yellow discharge around incision site)        Medication List  As of 08/17/2011 11:35 AM   STOP taking these medications         HYDROcodone-acetaminophen 5-500 MG per tablet      pantoprazole 40 MG tablet         TAKE these medications         albuterol 108 (90 BASE) MCG/ACT inhaler   Commonly known as: PROVENTIL HFA;VENTOLIN HFA   Inhale 2 puffs into the lungs every 6 (six) hours as needed. Shortness of breath      ALPRAZolam 0.5 MG tablet   Commonly known as: XANAX   Take 0.5 mg by mouth at bedtime.      fluticasone 50 MCG/ACT nasal spray  Commonly known as: FLONASE   Place 2 sprays into the nose as needed. sinus      LASTACAFT 0.25 % Soln   Generic drug: Alcaftadine   Apply 1 drop to eye daily.      pravastatin 40 MG tablet   Commonly known as: PRAVACHOL   Take 1 tablet (40 mg total) by mouth daily.      sertraline 100 MG tablet   Commonly known as: ZOLOFT   TAKE 1 TABLET BY MOUTH EVERY DAY      Vitamin D3 1000 UNITS Caps   Take 1 capsule by mouth daily.           Follow-up Information    Follow up with Lawson Isabell C, MD in 3 weeks.   Contact information:   [address] [place] [address] [phone]           Signed: Fabio Bering 08/17/2011, 11:35 AM

## 2011-08-30 ENCOUNTER — Telehealth: Payer: Self-pay | Admitting: Family Medicine

## 2011-08-31 ENCOUNTER — Encounter: Payer: Self-pay | Admitting: Internal Medicine

## 2011-09-01 ENCOUNTER — Encounter: Payer: Self-pay | Admitting: Gastroenterology

## 2011-09-01 ENCOUNTER — Ambulatory Visit (INDEPENDENT_AMBULATORY_CARE_PROVIDER_SITE_OTHER): Payer: Medicare Other | Admitting: Gastroenterology

## 2011-09-01 ENCOUNTER — Telehealth: Payer: Self-pay | Admitting: Family Medicine

## 2011-09-01 VITALS — BP 120/71 | HR 86 | Temp 98.2°F | Ht 69.0 in | Wt 142.0 lb

## 2011-09-01 DIAGNOSIS — M549 Dorsalgia, unspecified: Secondary | ICD-10-CM

## 2011-09-01 DIAGNOSIS — K219 Gastro-esophageal reflux disease without esophagitis: Secondary | ICD-10-CM

## 2011-09-01 MED ORDER — PANTOPRAZOLE SODIUM 40 MG PO TBEC: 40.0000 mg | DELAYED_RELEASE_TABLET | Freq: Two times a day (BID) | ORAL | Status: DC

## 2011-09-01 MED ORDER — OXYCODONE-ACETAMINOPHEN 5-325 MG PO TABS: ORAL_TABLET | ORAL | Status: DC

## 2011-09-01 NOTE — Patient Instructions (Addendum)
Please increase pantoprazole to twice a day (before breakfast and evening meal). You may do this for 2-3 months, but then I would like for you to try to decrease back to once a day.   Office visit in six months.

## 2011-09-01 NOTE — Progress Notes (Signed)
Faxed to PCP

## 2011-09-01 NOTE — Telephone Encounter (Signed)
Family member will give her the message, She will call back

## 2011-09-01 NOTE — Progress Notes (Signed)
Primary Care Physician: Syliva Overman, MD  Primary Gastroenterologist:  Roetta Sessions, MD   Chief Complaint  Patient presents with  . Follow-up    HPI: Kelly Douglas is a 56 y.o. female here f/u GERD. She has some breakthrough heartburn symptoms several times per week. Usually occurs in the evening. She states she no longer consumes beer. Wants to quit smoking and plans to get patches from PCP next month. Denies dysphagia, abdominal pain, melena, brbpr, constipation, diarrhea. Denies weight loss.   Current Outpatient Prescriptions  Medication Sig Dispense Refill  . albuterol (PROVENTIL HFA;VENTOLIN HFA) 108 (90 BASE) MCG/ACT inhaler Inhale 2 puffs into the lungs every 6 (six) hours as needed. Shortness of breath      . Alcaftadine (LASTACAFT) 0.25 % SOLN Apply 1 drop to eye daily.      Marland Kitchen ALPRAZolam (XANAX) 0.5 MG tablet Take 0.5 mg by mouth at bedtime.      . Cholecalciferol (VITAMIN D3) 1000 UNITS CAPS Take 1 capsule by mouth daily.        . fluticasone (FLONASE) 50 MCG/ACT nasal spray Place 2 sprays into the nose as needed. sinus      . gabapentin (NEURONTIN) 300 MG capsule Take 1 capsule (300 mg total) by mouth 3 (three) times daily.  90 capsule  4  . levothyroxine (SYNTHROID, LEVOTHROID) 75 MCG tablet Take 1 tablet (75 mcg total) by mouth daily.  30 tablet  2  . oxyCODONE-acetaminophen (ROXICET) 5-325 MG per tablet One tablet once daily, as needed for back pain. Discontinue vicodin  30 tablet  0  . pantoprazole (PROTONIX) 40 MG tablet Take 1 tablet (40 mg total) by mouth 2 (two) times daily with a meal.  60 tablet  5  . pravastatin (PRAVACHOL) 40 MG tablet Take 1 tablet (40 mg total) by mouth daily.  30 tablet  12  . sertraline (ZOLOFT) 100 MG tablet TAKE 1 TABLET BY MOUTH EVERY DAY  30 tablet  4  . temazepam (RESTORIL) 30 MG capsule Take 30 mg by mouth at bedtime as needed.      Marland Kitchen DISCONTD: pantoprazole (PROTONIX) 40 MG tablet Take 1 tablet (40 mg total) by mouth daily.  30  tablet  1    Allergies as of 09/01/2011  . (No Known Allergies)    ROS:  General: Negative for anorexia, weight loss, fever, chills, fatigue, weakness. ENT: Negative for hoarseness, difficulty swallowing , nasal congestion. CV: Negative for chest pain, angina, palpitations, dyspnea on exertion, peripheral edema.  Respiratory: Negative for dyspnea at rest, dyspnea on exertion, cough, sputum, wheezing.  GI: See history of present illness. GU:  Negative for dysuria, hematuria, urinary incontinence, urinary frequency, nocturnal urination.  Endo: Negative for unusual weight change.    Physical Examination:   BP 120/71  Pulse 86  Temp 98.2 F (36.8 C) (Temporal)  Ht 5\' 9"  (1.753 m)  Wt 142 lb (64.411 kg)  BMI 20.97 kg/m2  General: Well-nourished, well-developed in no acute distress.  Eyes: No icterus. Mouth: Oropharyngeal mucosa moist and pink , no lesions erythema or exudate. Lungs: Clear to auscultation bilaterally.  Heart: Regular rate and rhythm, no murmurs rubs or gallops.  Abdomen: Bowel sounds are normal, nontender, nondistended, no hepatosplenomegaly or masses, no abdominal bruits or hernia , no rebound or guarding.   Extremities: No lower extremity edema. No clubbing or deformities. Neuro: Alert and oriented x 4   Skin: Warm and dry, no jaundice.   Psych: Alert and cooperative, normal mood and affect.  Labs:  Lab Results  Component Value Date   WBC 14.5* 07/16/2011   HGB 13.3 07/16/2011   HCT 39.6 07/16/2011   MCV 74.7* 07/16/2011   PLT 260 07/16/2011   Lab Results  Component Value Date   CREATININE 0.72 07/16/2011   BUN 6 07/16/2011   NA 135 07/16/2011   K 3.5 07/16/2011   CL 98 07/16/2011   CO2 24 07/16/2011   Lab Results  Component Value Date   LIPASE 15 07/16/2011   Lab Results  Component Value Date   ALT 9 07/16/2011   AST 16 07/16/2011   ALKPHOS 125* 07/16/2011   BILITOT 0.4 07/16/2011    Imaging Studies: CT A/P on 07/16/11  IMPRESSION:  1. Findings, as  above, consistent with acute appendicitis, likely secondary to an obstructing appendicolith in the proximal aspect of the appendix. At this time, there is extensive periappendiceal stranding and a small amount of reactive fluid tracking inferiorly into the pelvis. No pneumoperitoneum. No definite periappendiceal  abscess at this time.  2. Extensive atherosclerosis.  3. Patulous distal esophagus which is fluid-filled. This is  similar to remote prior study from 08/05/2009. "Musculoskeletal: There are no aggressive appearing lytic or blastic lesions noted in the visualized portions of the skeleton."

## 2011-09-01 NOTE — Assessment & Plan Note (Signed)
Breakthrough evening symptoms. Increase protonix to bid for next couple of months. Then she will try to get back on once daily dosing. New RX given. OV in 6 months. She will call sooner if symptoms do not improve.

## 2011-09-02 NOTE — Telephone Encounter (Signed)
Called to find out what the patient needed but didn't get an answer. Told her if she still needed something

## 2011-09-29 ENCOUNTER — Telehealth: Payer: Self-pay | Admitting: Family Medicine

## 2011-09-29 ENCOUNTER — Other Ambulatory Visit: Payer: Self-pay

## 2011-09-29 DIAGNOSIS — M549 Dorsalgia, unspecified: Secondary | ICD-10-CM

## 2011-09-29 MED ORDER — OXYCODONE-ACETAMINOPHEN 5-325 MG PO TABS: ORAL_TABLET | ORAL | Status: DC

## 2011-09-29 NOTE — Telephone Encounter (Signed)
Me ready for collection

## 2011-10-12 ENCOUNTER — Encounter: Payer: Self-pay | Admitting: Family Medicine

## 2011-10-12 ENCOUNTER — Ambulatory Visit (INDEPENDENT_AMBULATORY_CARE_PROVIDER_SITE_OTHER): Payer: Medicare Other | Admitting: Family Medicine

## 2011-10-12 VITALS — BP 140/82 | HR 78 | Resp 16 | Ht 69.0 in | Wt 146.0 lb

## 2011-10-12 DIAGNOSIS — E785 Hyperlipidemia, unspecified: Secondary | ICD-10-CM

## 2011-10-12 DIAGNOSIS — Z0000 Encounter for general adult medical examination without abnormal findings: Secondary | ICD-10-CM

## 2011-10-12 DIAGNOSIS — Z23 Encounter for immunization: Secondary | ICD-10-CM

## 2011-10-12 NOTE — Progress Notes (Signed)
Subjective:    Patient ID: Kelly Douglas, female    DOB: 1955-07-12, 56 y.o.   MRN: [id number]  HPI Preventive Screening-Counseling & Management   Patient present here today for a Medicare annual wellness visit.   Current Problems (verified)   Medications Prior to Visit Allergies (verified)   PAST HISTORY  Family History: Mom deceased in her 93's, Dad deceased at 1, 9 siblings, 4 deceased , one was diabetic, one had cancer of throat, another an aneurysm, last MVA. A lot of HTN in family, some diabetes, cancer in 1, 1 mental health issue Social History Married x 25 years , 2 adult sons, current nicotine, quit alcohol   Risk Factors  Current exercise habits:  Walk  3 days per week 1 mile, up to 5 days  Dietary issues discussed:reduced fried food , increase fresh or frozen fruit and vegetable  Cardiac risk factors: Nicotine , CAD on chest CT scan  Depression Screen  (Note: if answer to either of the following is "Yes", a more complete depression screening is indicated)   Over the past two weeks, have you felt down, depressed or hopeless? No  Over the past two weeks, have you felt little interest or pleasure in doing things? No  Have you lost interest or pleasure in daily life? No  Do you often feel hopeless? No  Do you cry easily over simple problems? No   Activities of Daily Living  In your present state of health, do you have any difficulty performing the following activities?  Driving?: No Managing money?: No Feeding yourself?:No Getting from bed to chair?:No Climbing a flight of stairs?:tired when climbing stairs Preparing food and eating?:No Bathing or showering?:No Getting dressed?:No Getting to the toilet?:No Using the toilet?:No Moving around from place to place?: No  Fall Risk Assessment In the past year have you fallen or had a near fall?:No Are you currently taking any medications that make you dizziness?:No   Hearing Difficulties: No Do you often  ask people to speak up or repeat themselves?:No Do you experience ringing or noises in your ears?:No Do you have difficulty understanding soft or whispered voices?:No  Cognitive Testing  Alert? Yes Normal Appearance?Yes  Oriented to person? Yes Place? Yes  Time? Yes  Displays appropriate judgment?Yes  Can read the correct time from a watch face? yes Are you having problems remembering things?No  Advanced Directives have been discussed with the patient?Yes , full code, does not want prolonged support in the event of brain death   List the Names of Other Physician/Practitioners you currently use: See list   Indicate any recent Medical Services you may have received from other than [facility] providers in the past year (date may be approximate). Dr Kellie Simmering  Assessment:    Annual Wellness Exam   Plan:    During the course of the visit the patient was educated and counseled about appropriate screening and preventive services including:  A healthy diet is rich in fruit, vegetables and whole grains. Poultry fish, nuts and beans are a healthy choice for protein rather then red meat. A low sodium diet and drinking 64 ounces of water daily is generally recommended. Oils and sweet should be limited. Carbohydrates especially for those who are diabetic or overweight, should be limited to 30-45 gram per meal. It is important to eat on a regular schedule, at least 3 times daily. Snacks should be primarily fruits, vegetables or nuts. It is important that you exercise regularly at least 30 minutes 5  times a week. If you develop chest pain, have severe difficulty breathing, or feel very tired, stop exercising immediately and seek medical attention  Immunization reviewed and updated. Cancer screening reviewed and updated    Patient Instructions (the written plan) was given to the patient.  Medicare Attestation  I have personally reviewed:  The patient's medical and social history  Their use of alcohol,  tobacco or illicit drugs  Their current medications and supplements  The patient's functional ability including ADLs,fall risks, home safety risks, cognitive, and hearing and visual impairment  Diet and physical activities  Evidence for depression or mood disorders  The patient's weight, height, BMI, and visual acuity have been recorded in the chart. I have made referrals, counseling, and provided education to the patient based on review of the above and I have provided the patient with a written personalized care plan for preventive services.      Review of Systems     Objective:   Physical Exam        Assessment & Plan:

## 2011-10-12 NOTE — Patient Instructions (Addendum)
F/u in 4.5 month  Fasting lipid, cmp in 4.5 month  You need to stop smoking today   Please think about quitting smoking.  This is very important for your health.  Consider setting a quit date, then cutting back or switching brands to prepare to stop.  Also think of the money you will save every day by not smoking.  Quick Tips to Quit Smoking: Fix a date i.e. keep a date in mind from when you would not touch a tobacco product to smoke  Keep yourself busy and block your mind with work loads or reading books or watching movies in malls where smoking is not allowed  Vanish off the things which reminds you about smoking for example match box, or your favorite lighter, or the pipe you used for smoking, or your favorite jeans and shirt with which you used to enjoy smoking, or the club where you used to do smoking  Try to avoid certain people places and incidences where and with whom smoking is a common factor to add on  Praise yourself with some token gifts from the money you saved by stopping smoking  Anti Smoking teams are there to help you. Join their programs  Anti-smoking Gums are there in many medical shops. Try them to quit smoking   Side-effects of Smoking: Disease caused by smoking cigarettes are emphysema, bronchitis, heart failures  Premature death  Cancer is the major side effect of smoking  Heart attacks and strokes are the quick effects of smoking causing sudden death  Some smokers lives end up with limbs amputated  Breathing problem or fast breathing is another side effect of smoking  Due to more intakes of smokes, carbon mono-oxide goes into your brain and other muscles of the body which leads to swelling of the veins and blockage to the air passage to lungs  Carbon monoxide blocks blood vessels which leads to blockage in the flow of blood to different major body organs like heart lungs and thus leads to attacks and deaths  During pregnancy smoking is very harmful and leads to  premature birth of the infant, spontaneous abortions, low weight of the infant during birth  Fat depositions to narrow and blocked blood vessels causing heart attacks  In many cases cigarette smoking caused infertility in men

## 2011-10-13 ENCOUNTER — Other Ambulatory Visit: Payer: Self-pay

## 2011-10-13 MED ORDER — SERTRALINE HCL 100 MG PO TABS: ORAL_TABLET | ORAL | Status: DC

## 2011-10-13 MED ORDER — ALPRAZOLAM 0.5 MG PO TABS: 0.5000 mg | ORAL_TABLET | Freq: Every day | ORAL | Status: DC

## 2011-10-13 MED ORDER — LEVOTHYROXINE SODIUM 75 MCG PO TABS: 75.0000 ug | ORAL_TABLET | Freq: Every day | ORAL | Status: DC

## 2011-10-16 DIAGNOSIS — Z0000 Encounter for general adult medical examination without abnormal findings: Secondary | ICD-10-CM | POA: Insufficient documentation

## 2011-10-16 NOTE — Assessment & Plan Note (Signed)
Pt had annual wellness exam , which is fully documented . No current limitation in ability to function and care for herself independently.  Needs to quit nicotine, counseled for 5 minutes  End of life issues discussed, she  in turn needs to discuss with family and document

## 2011-11-01 ENCOUNTER — Other Ambulatory Visit: Payer: Self-pay

## 2011-11-01 DIAGNOSIS — M549 Dorsalgia, unspecified: Secondary | ICD-10-CM

## 2011-11-01 MED ORDER — OXYCODONE-ACETAMINOPHEN 5-325 MG PO TABS: ORAL_TABLET | ORAL | Status: DC

## 2011-11-04 ENCOUNTER — Emergency Department (HOSPITAL_COMMUNITY): Payer: No Typology Code available for payment source

## 2011-11-04 ENCOUNTER — Emergency Department (HOSPITAL_COMMUNITY)
Admission: EM | Admit: 2011-11-04 | Discharge: 2011-11-04 | Disposition: A | Discharge status: Home / Self Care | Payer: No Typology Code available for payment source | Attending: Emergency Medicine | Admitting: Emergency Medicine

## 2011-11-04 ENCOUNTER — Encounter (HOSPITAL_COMMUNITY): Payer: Self-pay | Admitting: *Deleted

## 2011-11-04 DIAGNOSIS — S39012A Strain of muscle, fascia and tendon of lower back, initial encounter: Secondary | ICD-10-CM

## 2011-11-04 DIAGNOSIS — S161XXA Strain of muscle, fascia and tendon at neck level, initial encounter: Secondary | ICD-10-CM

## 2011-11-04 DIAGNOSIS — S139XXA Sprain of joints and ligaments of unspecified parts of neck, initial encounter: Secondary | ICD-10-CM | POA: Insufficient documentation

## 2011-11-04 DIAGNOSIS — S335XXA Sprain of ligaments of lumbar spine, initial encounter: Secondary | ICD-10-CM | POA: Insufficient documentation

## 2011-11-04 MED ORDER — CYCLOBENZAPRINE HCL 10 MG PO TABS
10.0000 mg | ORAL_TABLET | Freq: Once | ORAL | Status: AC
  Administered 2011-11-04: 10 mg via ORAL
  Filled 2011-11-04: qty 1

## 2011-11-04 MED ORDER — IBUPROFEN 800 MG PO TABS
800.0000 mg | ORAL_TABLET | Freq: Once | ORAL | Status: AC
  Administered 2011-11-04: 800 mg via ORAL
  Filled 2011-11-04: qty 1

## 2011-11-04 MED ORDER — CYCLOBENZAPRINE HCL 10 MG PO TABS: ORAL_TABLET | ORAL | Status: DC
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
## 2011-11-04 NOTE — ED Notes (Signed)
Patient with no complaints at this time. Respirations even and unlabored. Skin warm/dry. Discharge instructions reviewed with patient at this time. Patient given opportunity to voice concerns/ask questions. Patient discharged at this time and left Emergency Department with steady gait.   
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
## 2011-11-05 NOTE — ED Provider Notes (Signed)
Medical screening examination/treatment/procedure(s) were performed by non-physician practitioner and as supervising physician I was immediately available for consultation/collaboration.  Shelda Jakes, MD 11/05/11 [phone]

## 2011-11-07 ENCOUNTER — Encounter: Payer: Self-pay | Admitting: Family Medicine

## 2011-11-07 ENCOUNTER — Ambulatory Visit (INDEPENDENT_AMBULATORY_CARE_PROVIDER_SITE_OTHER): Payer: Medicare Other | Admitting: Family Medicine

## 2011-11-07 VITALS — BP 130/80 | HR 106 | Temp 98.7°F | Resp 15 | Ht 69.0 in | Wt 144.0 lb

## 2011-11-07 DIAGNOSIS — J984 Other disorders of lung: Secondary | ICD-10-CM

## 2011-11-07 DIAGNOSIS — J209 Acute bronchitis, unspecified: Secondary | ICD-10-CM

## 2011-11-07 DIAGNOSIS — F17200 Nicotine dependence, unspecified, uncomplicated: Secondary | ICD-10-CM

## 2011-11-07 DIAGNOSIS — Z720 Tobacco use: Secondary | ICD-10-CM

## 2011-11-07 MED ORDER — AZITHROMYCIN 250 MG PO TABS: ORAL_TABLET | ORAL | Status: AC

## 2011-11-07 MED ORDER — FLUTICASONE PROPIONATE 50 MCG/ACT NA SUSP: 2.0000 | Freq: Every day | NASAL | Status: DC

## 2011-11-07 NOTE — Assessment & Plan Note (Signed)
Scarring and lung nodules being followed

## 2011-11-07 NOTE — Assessment & Plan Note (Signed)
Counseled on cessation, she has cut back significantly based on history however has CLD noted in chair with nodule that has been monitered
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
## 2011-11-07 NOTE — Assessment & Plan Note (Signed)
Previous heavy smoker, continues to smoke but improved, will cover with albuterol, antibiotics and flonase for nasal drainage

## 2011-11-07 NOTE — Patient Instructions (Addendum)
Treating for bronchitis- use the inhaler every 4 hours Take the antibiotic as prescribed  Use the nose spray Call if you do not improve  Keep previous appt with Dr. Lodema Hong

## 2011-11-14 ENCOUNTER — Telehealth: Payer: Self-pay | Admitting: Family Medicine

## 2011-11-14 NOTE — Telephone Encounter (Signed)
rx ready to be picked up

## 2011-11-17 ENCOUNTER — Other Ambulatory Visit: Payer: Self-pay | Admitting: Family Medicine

## 2011-12-02 ENCOUNTER — Other Ambulatory Visit: Payer: Self-pay

## 2011-12-02 MED ORDER — OXYCODONE-ACETAMINOPHEN 5-325 MG PO TABS: 1.0000 | ORAL_TABLET | Freq: Every day | ORAL | Status: DC | PRN

## 2012-01-13 ENCOUNTER — Other Ambulatory Visit: Payer: Self-pay

## 2012-01-13 MED ORDER — OXYCODONE-ACETAMINOPHEN 5-325 MG PO TABS: 1.0000 | ORAL_TABLET | Freq: Every day | ORAL | Status: DC | PRN

## 2012-01-17 ENCOUNTER — Encounter: Payer: Self-pay | Admitting: *Deleted

## 2012-02-02 ENCOUNTER — Encounter: Payer: Self-pay | Admitting: Cardiology

## 2012-02-02 ENCOUNTER — Ambulatory Visit (INDEPENDENT_AMBULATORY_CARE_PROVIDER_SITE_OTHER): Payer: Medicare Other | Admitting: Cardiology

## 2012-02-02 VITALS — BP 158/110 | HR 75 | Ht 69.0 in | Wt 152.0 lb

## 2012-02-02 DIAGNOSIS — E785 Hyperlipidemia, unspecified: Secondary | ICD-10-CM

## 2012-02-02 DIAGNOSIS — E782 Mixed hyperlipidemia: Secondary | ICD-10-CM

## 2012-02-02 DIAGNOSIS — Z0189 Encounter for other specified special examinations: Secondary | ICD-10-CM

## 2012-02-02 DIAGNOSIS — Z720 Tobacco use: Secondary | ICD-10-CM

## 2012-02-02 DIAGNOSIS — I2510 Atherosclerotic heart disease of native coronary artery without angina pectoris: Secondary | ICD-10-CM

## 2012-02-02 DIAGNOSIS — F17200 Nicotine dependence, unspecified, uncomplicated: Secondary | ICD-10-CM

## 2012-02-02 DIAGNOSIS — G8929 Other chronic pain: Secondary | ICD-10-CM

## 2012-02-02 DIAGNOSIS — M549 Dorsalgia, unspecified: Secondary | ICD-10-CM

## 2012-02-02 DIAGNOSIS — J984 Other disorders of lung: Secondary | ICD-10-CM

## 2012-02-02 DIAGNOSIS — F341 Dysthymic disorder: Secondary | ICD-10-CM

## 2012-02-02 DIAGNOSIS — F329 Major depressive disorder, single episode, unspecified: Secondary | ICD-10-CM

## 2012-02-02 DIAGNOSIS — F32A Depression, unspecified: Secondary | ICD-10-CM

## 2012-02-02 DIAGNOSIS — I7090 Unspecified atherosclerosis: Secondary | ICD-10-CM

## 2012-02-02 DIAGNOSIS — I10 Essential (primary) hypertension: Secondary | ICD-10-CM

## 2012-02-02 DIAGNOSIS — I1310 Hypertensive heart and chronic kidney disease without heart failure, with stage 1 through stage 4 chronic kidney disease, or unspecified chronic kidney disease: Secondary | ICD-10-CM

## 2012-02-02 MED ORDER — BUPROPION HCL ER (SR) 150 MG PO TB12: 150.0000 mg | ORAL_TABLET | Freq: Two times a day (BID) | ORAL | Status: DC

## 2012-02-02 NOTE — Assessment & Plan Note (Signed)
Most recent lipid profile was fair, but not optimal in 08/2010.  Repeat lipid levels will be obtained and treatment adjusted accordingly.

## 2012-02-02 NOTE — Assessment & Plan Note (Addendum)
Back pain is the issue that troubles her most from day to day.  She has had some benefit from the treatment provided to date, but will need continuing attention.

## 2012-02-02 NOTE — Progress Notes (Deleted)
Name: Kelly Douglas    DOB: 05/03/1955  Age: 57 y.o.  MR#: [id number]       PCP:  Syliva Overman, MD      Insurance: @PAYORNAME @   CC:   No chief complaint on file.  MEDICATION LIST  VS BP 158/110  Pulse 75  Ht 5\' 9"  (1.753 m)  Wt 152 lb (68.947 kg)  BMI 22.45 kg/m2  SpO2 95%  Weights Current Weight  02/02/12 152 lb (68.947 kg)  11/07/11 144 lb (65.318 kg)  11/04/11 146 lb (66.225 kg)    Blood Pressure  BP Readings from Last 3 Encounters:  02/02/12 158/110  11/07/11 130/80  11/04/11 149/73     Admit date:  (Not on file) Last encounter with RMR:  Visit date not found   Allergy No Known Allergies  Current Outpatient Prescriptions  Medication Sig Dispense Refill  . albuterol (PROVENTIL HFA;VENTOLIN HFA) 108 (90 BASE) MCG/ACT inhaler Inhale 2 puffs into the lungs every 6 (six) hours as needed. Shortness of breath       . ALPRAZolam (XANAX) 0.5 MG tablet Take 1 tablet (0.5 mg total) by mouth at bedtime.  30 tablet  2  . aspirin 325 MG tablet Take 650 mg by mouth once as needed. For pain       . fluticasone (FLONASE) 50 MCG/ACT nasal spray Place 2 sprays into the nose as needed.      . gabapentin (NEURONTIN) 300 MG capsule Take 1 capsule (300 mg total) by mouth 3 (three) times daily.  90 capsule  4  . levothyroxine (SYNTHROID, LEVOTHROID) 75 MCG tablet Take 1 tablet (75 mcg total) by mouth daily.  30 tablet  4  . pantoprazole (PROTONIX) 40 MG tablet Take 40 mg by mouth daily.      . pravastatin (PRAVACHOL) 40 MG tablet Take 1 tablet (40 mg total) by mouth daily.  30 tablet  12  . sertraline (ZOLOFT) 100 MG tablet Take 100 mg by mouth daily.       . Cholecalciferol (VITAMIN D3) 1000 UNITS CAPS Take 1 capsule by mouth daily.          Discontinued Meds:    Medications Discontinued During This Encounter  Medication Reason  . levothyroxine (SYNTHROID, LEVOTHROID) 75 MCG tablet Error  . oxyCODONE-acetaminophen (PERCOCET/ROXICET) 5-325 MG per tablet Error  . fluticasone  (FLONASE) 50 MCG/ACT nasal spray   . cyclobenzaprine (FLEXERIL) 10 MG tablet Error    Patient Active Problem List  Diagnosis  . VITAMIN D DEFICIENCY  . Hyperlipidemia  . Anxiety and depression  . Tobacco abuse  . ALLERGIC RHINITIS  . Chronic back pain  . GERD (gastroesophageal reflux disease)  . Scleroderma  . Chronic lung disease  . Hypothyroid  . Arteriosclerotic cardiovascular disease (ASCVD)  . Syncope  . Laboratory test  . Insomnia  . Routine general medical examination at a health care facility  . Acute bronchitis    LABS No visits with results within 3 Month(s) from this visit. Latest known visit with results is:  Admission on 07/16/2011, Discharged on 07/17/2011  Component Date Value  . WBC 07/16/2011 14.5*  . RBC 07/16/2011 5.30*  . Hemoglobin 07/16/2011 13.3   . HCT 07/16/2011 39.6   . MCV 07/16/2011 74.7*  . [facility] 07/16/2011 25.1*  . MCHC 07/16/2011 33.6   . RDW 07/16/2011 15.5   . Platelets 07/16/2011 260   . Neutrophils Relative 07/16/2011 78*  . Lymphocytes Relative 07/16/2011 17   . Monocytes Relative 07/16/2011  5   . Eosinophils Relative 07/16/2011 0   . Basophils Relative 07/16/2011 0   . Neutro Abs 07/16/2011 11.3*  . Lymphs Abs 07/16/2011 2.5   . Monocytes Absolute 07/16/2011 0.7   . Eosinophils Absolute 07/16/2011 0.0   . Basophils Absolute 07/16/2011 0.0   . WBC Morphology 07/16/2011 WHITE COUNT CONFIRMED ON SMEAR   . Smear Review 07/16/2011 LARGE PLATELETS PRESENT   . Sodium 07/16/2011 135   . Potassium 07/16/2011 3.5   . Chloride 07/16/2011 98   . CO2 07/16/2011 24   . Glucose, Bld 07/16/2011 124*  . BUN 07/16/2011 6   . Creatinine, Ser 07/16/2011 0.72   . Calcium 07/16/2011 10.5   . Total Protein 07/16/2011 8.4*  . Albumin 07/16/2011 4.5   . AST 07/16/2011 16   . ALT 07/16/2011 9   . Alkaline Phosphatase 07/16/2011 125*  . Total Bilirubin 07/16/2011 0.4   . GFR calc non Af Amer 07/16/2011 >90   . GFR calc Af Amer 07/16/2011 >90     . Color, Urine 07/16/2011 YELLOW   . APPearance 07/16/2011 CLEAR   . Specific Gravity, Urine 07/16/2011 1.020   . pH 07/16/2011 8.5*  . Glucose, UA 07/16/2011 NEGATIVE   . Hgb urine dipstick 07/16/2011 NEGATIVE   . Bilirubin Urine 07/16/2011 NEGATIVE   . Ketones, ur 07/16/2011 NEGATIVE   . Protein, ur 07/16/2011 NEGATIVE   . Urobilinogen, UA 07/16/2011 0.2   . Nitrite 07/16/2011 NEGATIVE   . Leukocytes, UA 07/16/2011 NEGATIVE   . Lipase 07/16/2011 15   . MRSA, PCR 07/16/2011 NEGATIVE   . Staphylococcus aureus 07/16/2011 NEGATIVE      Results for this Opt Visit:     Results for orders placed during the hospital encounter of 07/16/11  CBC WITH DIFFERENTIAL      Component Value Range   WBC 14.5 (*) 4.0 - 10.5 K/uL   RBC 5.30 (*) 3.87 - 5.11 MIL/uL   Hemoglobin 13.3  12.0 - 15.0 g/dL   HCT 69.6  29.5 - 28.4 %   MCV 74.7 (*) 78.0 - 100.0 fL   MCH 25.1 (*) 26.0 - 34.0 pg   MCHC 33.6  30.0 - 36.0 g/dL   RDW 13.2  44.0 - 10.2 %   Platelets 260  150 - 400 K/uL   Neutrophils Relative 78 (*) 43 - 77 %   Lymphocytes Relative 17  12 - 46 %   Monocytes Relative 5  3 - 12 %   Eosinophils Relative 0  0 - 5 %   Basophils Relative 0  0 - 1 %   Neutro Abs 11.3 (*) 1.7 - 7.7 K/uL   Lymphs Abs 2.5  0.7 - 4.0 K/uL   Monocytes Absolute 0.7  0.1 - 1.0 K/uL   Eosinophils Absolute 0.0  0.0 - 0.7 K/uL   Basophils Absolute 0.0  0.0 - 0.1 K/uL   WBC Morphology WHITE COUNT CONFIRMED ON SMEAR     Smear Review LARGE PLATELETS PRESENT    COMPREHENSIVE METABOLIC PANEL      Component Value Range   Sodium 135  135 - 145 mEq/L   Potassium 3.5  3.5 - 5.1 mEq/L   Chloride 98  96 - 112 mEq/L   CO2 24  19 - 32 mEq/L   Glucose, Bld 124 (*) 70 - 99 mg/dL   BUN 6  6 - 23 mg/dL   Creatinine, Ser 7.25  0.50 - 1.10 mg/dL   Calcium 36.6  8.4 - 44.0  mg/dL   Total Protein 8.4 (*) 6.0 - 8.3 g/dL   Albumin 4.5  3.5 - 5.2 g/dL   AST 16  0 - 37 U/L   ALT 9  0 - 35 U/L   Alkaline Phosphatase 125 (*) 39 - 117 U/L    Total Bilirubin 0.4  0.3 - 1.2 mg/dL   GFR calc non Af Amer >90  >90 mL/min   GFR calc Af Amer >90  >90 mL/min  URINALYSIS, ROUTINE W REFLEX MICROSCOPIC      Component Value Range   Color, Urine YELLOW  YELLOW   APPearance CLEAR  CLEAR   Specific Gravity, Urine 1.020  1.005 - 1.030   pH 8.5 (*) 5.0 - 8.0   Glucose, UA NEGATIVE  NEGATIVE mg/dL   Hgb urine dipstick NEGATIVE  NEGATIVE   Bilirubin Urine NEGATIVE  NEGATIVE   Ketones, ur NEGATIVE  NEGATIVE mg/dL   Protein, ur NEGATIVE  NEGATIVE mg/dL   Urobilinogen, UA 0.2  0.0 - 1.0 mg/dL   Nitrite NEGATIVE  NEGATIVE   Leukocytes, UA NEGATIVE  NEGATIVE  LIPASE, BLOOD      Component Value Range   Lipase 15  11 - 59 U/L  SURGICAL PCR SCREEN      Component Value Range   MRSA, PCR NEGATIVE  NEGATIVE   Staphylococcus aureus NEGATIVE  NEGATIVE    EKG Orders placed during the hospital encounter of 07/16/11  . EKG 12-LEAD  . EKG 12-LEAD  . EKG     Prior Assessment and Plan Problem List as of 02/02/2012            Cardiology Problems   Hyperlipidemia   Last Assessment & Plan Note   08/10/2011 Office Visit Signed 08/14/2011  1:34 PM by Kerri Perches, MD    Hyperlipidemia:Low fat diet discussed and encouraged.  Updated lab needed    Arteriosclerotic cardiovascular disease (ASCVD)   Last Assessment & Plan Note   06/06/2011 Office Visit Signed 06/06/2011  6:41 PM by Kathlen Brunswick, MD    Coronary calcification with reasonable exercise tolerance and no symptoms to suggest myocardial ischemia.  Optimal control of cardiovascular risk factors will be our approach to this problem.    Syncope   Last Assessment & Plan Note   05/05/2011 Office Visit Signed 05/05/2011  1:16 PM by Kathlen Brunswick, MD    History is suggestive of a neurocardiogenic mechanism.  The importance of avoiding falls was discussed with appropriate behavioral measures described.  If episodes continue to be rare and not damaging, I would not be inclined to proceed  with electrophysiology assessment.      Other   VITAMIN D DEFICIENCY   Anxiety and depression   Last Assessment & Plan Note   08/10/2011 Office Visit Signed 08/14/2011  1:32 PM by Kerri Perches, MD    Improved on current medication, she has ahd a lot of health challenges since last visit, gyne surgery then appendectomy, soshe has some increased anxiety     Tobacco abuse   Last Assessment & Plan Note   11/07/2011 Office Visit Signed 11/07/2011 10:15 PM by Salley Scarlet, MD    Counseled on cessation, she has cut back significantly based on history however has CLD noted in chair with nodule that has been monitered    ALLERGIC RHINITIS   Last Assessment & Plan Note   09/02/2010 Office Visit Signed 09/05/2010  5:51 PM by Kerri Perches, MD    Controlled, no change  in medication     Chronic back pain   Last Assessment & Plan Note   08/10/2011 Office Visit Signed 08/14/2011  1:33 PM by Kerri Perches, MD    Reports the oxycodone she was on recently for her surgical pain was more effective than vicodin. Will change with pain contract in place, and also add gabapentin    GERD (gastroesophageal reflux disease)   Last Assessment & Plan Note   09/01/2011 Office Visit Signed 09/01/2011  2:30 PM by Tiffany Kocher, PA    Breakthrough evening symptoms. Increase protonix to bid for next couple of months. Then she will try to get back on once daily dosing. New RX given. OV in 6 months. She will call sooner if symptoms do not improve.     Scleroderma   Chronic lung disease   Last Assessment & Plan Note   11/07/2011 Office Visit Signed 11/07/2011 10:15 PM by Salley Scarlet, MD    Scarring and lung nodules being followed    Hypothyroid   Last Assessment & Plan Note   08/10/2011 Office Visit Signed 08/14/2011  1:34 PM by Kerri Perches, MD    Controlled, no change in medication     Laboratory test   Insomnia   Last Assessment & Plan Note   05/09/2011 Office Visit Signed 05/30/2011   4:12 AM by Kerri Perches, MD    Uncontrolled despite practice of good sleep hygiene. Increase dose of restoril and add additional medication    Routine general medical examination at a health care facility   Last Assessment & Plan Note   10/12/2011 Office Visit Signed 10/16/2011  6:08 PM by Kerri Perches, MD    Pt had annual wellness exam , which is fully documented . No current limitation in ability to function and care for herself independently.  Needs to quit nicotine, counseled for 5 minutes  End of life issues discussed, she  in turn needs to discuss with family and document    Acute bronchitis   Last Assessment & Plan Note   11/07/2011 Office Visit Signed 11/07/2011 10:14 PM by Salley Scarlet, MD    Previous heavy smoker, continues to smoke but improved, will cover with albuterol, antibiotics and flonase for nasal drainage        Imaging: No results found.   FRS Calculation: Score not calculated. Missing: Total Cholesterol

## 2012-02-02 NOTE — Assessment & Plan Note (Signed)
Patient has some motivation to quit, but this does not seem like a major priority for her.  She has been willing to consider pharmacologic assistance, but has not been able to afford the medication, which is not covered by her insurance.  We will attempt to secure a prescription for Wellbutrin for this purpose.

## 2012-02-02 NOTE — Progress Notes (Signed)
Patient ID: Kelly Douglas, female   DOB: 15-Apr-1955, 57 y.o.   MRN: [id number]  HPI: Scheduled return visit for this nice woman's with chronic lung disease and coronary artery disease.  Since her last visit, she underwent uncomplicated bilateral salpingo-oophorectomy and appendectomy for acute appendicitis.  She required office treatment for a chronic obstructive pulmonary disease exacerbation, but reports that these rarely occur.  She's had chronic back problems treated by both Dr. Nickola Major and a chiropractor.  Unfortunately, she continues to smoke cigarettes at the rate of one half pack per day.  Fortunately, this is well less than her previous consumption of 2-3 packs per day  Prior to Admission medications   Medication Sig Start Date End Date Taking? Authorizing Provider  albuterol (PROVENTIL HFA;VENTOLIN HFA) 108 (90 BASE) MCG/ACT inhaler Inhale 2 puffs into the lungs every 6 (six) hours as needed. Shortness of breath    Yes Historical Provider, MD  ALPRAZolam (XANAX) 0.5 MG tablet Take 1 tablet (0.5 mg total) by mouth at bedtime. 10/13/11 10/12/12 Yes Kerri Perches, MD  aspirin (ASPIRIN EC) 81 MG EC tablet Take 81 mg by mouth daily. Swallow whole.   Yes Historical Provider, MD  fluticasone (FLONASE) 50 MCG/ACT nasal spray Place 2 sprays into the nose as needed. 11/07/11 11/06/12 Yes Salley Scarlet, MD  gabapentin (NEURONTIN) 300 MG capsule Take 1 capsule (300 mg total) by mouth 3 (three) times daily. 08/10/11 08/09/12 Yes Kerri Perches, MD  levothyroxine (SYNTHROID, LEVOTHROID) 75 MCG tablet Take 1 tablet (75 mcg total) by mouth daily. 10/13/11  Yes Kerri Perches, MD  pantoprazole (PROTONIX) 40 MG tablet Take 40 mg by mouth daily. 09/01/11 08/31/12 Yes Tiffany Kocher, PA  pravastatin (PRAVACHOL) 40 MG tablet Take 1 tablet (40 mg total) by mouth daily. 05/05/11 05/04/12 Yes Kathlen Brunswick, MD  sertraline (ZOLOFT) 100 MG tablet Take 100 mg by mouth daily.  11/16/11  Yes Historical  Provider, MD  buPROPion (WELLBUTRIN SR) 150 MG 12 hr tablet Take 1 tablet (150 mg total) by mouth 2 (two) times daily. 02/02/12   Kathlen Brunswick, MD  Cholecalciferol (VITAMIN D3) 1000 UNITS CAPS Take 1 capsule by mouth daily.      Historical Provider, MD  No Known Allergies    Past medical history, social history, and family history reviewed and updated.  ROS: Denies chest pain, orthopnea, PND, peripheral edema, palpitations or syncope.  She has chronic class III dyspnea on exertion.  PHYSICAL EXAM: BP 158/110  Pulse 75  Ht 5\' 9"  (1.753 m)  Wt 68.947 kg (152 lb)  BMI 22.45 kg/m2  SpO2 95%  General-Well developed; no acute distress; markedly carious teeth Body habitus-slightly underweight Neck-No JVD; no carotid bruits Lungs-mild expiratory rhonchi; resonant to percussion; mildly prolonged expiratory phase Cardiovascular-normal PMI; normal S1, increased P2, S4 present Abdomen-normal bowel sounds; soft and non-tender without masses or organomegaly Musculoskeletal-No deformities, no cyanosis or clubbing Neurologic-Normal cranial nerves; symmetric strength and tone Skin-Warm, no significant lesions Extremities-distal pulses intact; no edema  ASSESSMENT AND PLAN:  [place], MD 02/02/2012 2:36 PM

## 2012-02-02 NOTE — Assessment & Plan Note (Signed)
Patient understands the link between lung disease and cigarette smoking and has some motivation to discontinue the latter.  She has not experienced significant chronic obstructive pulmonary disease exacerbations, and I explained that our goal is to see that she does not.

## 2012-02-02 NOTE — Assessment & Plan Note (Addendum)
Patient has asymptomatic coronary disease as documented by CT scanning.  She is concerned about possible progression of disease.  I explained that this could only be assessed with a CT scan or cardiac catheterization, neither of which is warranted and that we are maintaining maximal effort to control risk factors so that CAD does not progress.

## 2012-02-02 NOTE — Patient Instructions (Signed)
Your physician recommends that you schedule a follow-up appointment in:  1 - 1 year 2 - 1 month for blood pressure check  Your physician has requested that you regularly monitor and record your blood pressure readings at home. Please use the same machine at the same time of day to check your readings and record them to bring to your follow-up visit.  Your physician has recommended you make the following change in your medication:  1 - START enteric coated Aspirin 81 mg  2 - START Wellbutrin 150 mg daily x 3 days, then twice daily thereafter  Your physician recommends that you return for lab work in: Today  STOP Smoking

## 2012-02-06 ENCOUNTER — Telehealth: Payer: Self-pay | Admitting: Family Medicine

## 2012-02-07 NOTE — Telephone Encounter (Signed)
Patient has rx from dec that she did not collect. Attempted to call and notify patient but phone not in working order.

## 2012-02-15 ENCOUNTER — Encounter: Payer: Self-pay | Admitting: Cardiology

## 2012-02-15 LAB — COMPREHENSIVE METABOLIC PANEL
ALT: 8 U/L (ref 0–35)
AST: 14 U/L (ref 0–37)
Alkaline Phosphatase: 106 U/L (ref 39–117)
Calcium: 10.4 mg/dL (ref 8.4–10.5)
Chloride: 105 mEq/L (ref 96–112)
Creat: 0.92 mg/dL (ref 0.50–1.10)

## 2012-02-15 LAB — CBC
MCHC: 32.7 g/dL (ref 30.0–36.0)
MCV: 75.3 fL — ABNORMAL LOW (ref 78.0–100.0)
Platelets: 277 10*3/uL (ref 150–400)
RDW: 16 % — ABNORMAL HIGH (ref 11.5–15.5)
WBC: 8.3 10*3/uL (ref 4.0–10.5)

## 2012-02-15 LAB — LIPID PANEL
LDL Cholesterol: 68 mg/dL (ref 0–99)
Total CHOL/HDL Ratio: 3.8 Ratio
VLDL: 28 mg/dL (ref 0–40)

## 2012-02-16 ENCOUNTER — Encounter: Payer: Self-pay | Admitting: *Deleted

## 2012-02-22 ENCOUNTER — Encounter: Payer: Self-pay | Admitting: Family Medicine

## 2012-02-22 ENCOUNTER — Ambulatory Visit (INDEPENDENT_AMBULATORY_CARE_PROVIDER_SITE_OTHER): Payer: Medicare Other | Admitting: Family Medicine

## 2012-02-22 VITALS — BP 150/100 | HR 100 | Resp 18 | Ht 69.0 in | Wt 151.1 lb

## 2012-02-22 DIAGNOSIS — K219 Gastro-esophageal reflux disease without esophagitis: Secondary | ICD-10-CM

## 2012-02-22 DIAGNOSIS — I10 Essential (primary) hypertension: Secondary | ICD-10-CM | POA: Insufficient documentation

## 2012-02-22 DIAGNOSIS — F341 Dysthymic disorder: Secondary | ICD-10-CM

## 2012-02-22 DIAGNOSIS — M549 Dorsalgia, unspecified: Secondary | ICD-10-CM

## 2012-02-22 DIAGNOSIS — F17200 Nicotine dependence, unspecified, uncomplicated: Secondary | ICD-10-CM

## 2012-02-22 DIAGNOSIS — E039 Hypothyroidism, unspecified: Secondary | ICD-10-CM

## 2012-02-22 DIAGNOSIS — E785 Hyperlipidemia, unspecified: Secondary | ICD-10-CM

## 2012-02-22 DIAGNOSIS — Z720 Tobacco use: Secondary | ICD-10-CM

## 2012-02-22 DIAGNOSIS — G8929 Other chronic pain: Secondary | ICD-10-CM

## 2012-02-22 DIAGNOSIS — F329 Major depressive disorder, single episode, unspecified: Secondary | ICD-10-CM

## 2012-02-22 MED ORDER — TIZANIDINE HCL 2 MG PO CAPS: ORAL_CAPSULE | ORAL | Status: AC

## 2012-02-22 MED ORDER — KETOROLAC TROMETHAMINE 60 MG/2ML IJ SOLN
60.0000 mg | Freq: Once | INTRAMUSCULAR | Status: AC
  Administered 2012-02-22: 60 mg via INTRAMUSCULAR

## 2012-02-22 MED ORDER — PREDNISONE 5 MG PO TABS: 5.0000 mg | ORAL_TABLET | Freq: Two times a day (BID) | ORAL | Status: DC

## 2012-02-22 MED ORDER — METHYLPREDNISOLONE ACETATE 80 MG/ML IJ SUSP
80.0000 mg | Freq: Once | INTRAMUSCULAR | Status: AC
  Administered 2012-02-22: 80 mg via INTRAMUSCULAR

## 2012-02-22 MED ORDER — ACETAMINOPHEN 325 MG RE SUPP: RECTAL | Status: DC

## 2012-02-22 MED ORDER — AMLODIPINE BESYLATE 5 MG PO TABS: 5.0000 mg | ORAL_TABLET | Freq: Every day | ORAL | Status: DC
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
## 2012-02-22 NOTE — Patient Instructions (Addendum)
F/u in May, call if you need me before  Please stop smoking    No more oxycodone for pain from this office  Tylenol is prescribed for pain, no more than 4 tablets per week.   Prednisone and a muscle relaxant are prescribed    Toradol 60mg  and depo medrol 80mg  IM in the office for pain today  TSH today  Blood pressure is high start amlodipine one tablet once daily at  9 every night

## 2012-02-23 ENCOUNTER — Other Ambulatory Visit: Payer: Self-pay

## 2012-02-23 ENCOUNTER — Other Ambulatory Visit: Payer: Self-pay | Admitting: Family Medicine

## 2012-02-23 DIAGNOSIS — IMO0001 Reserved for inherently not codable concepts without codable children: Secondary | ICD-10-CM

## 2012-02-23 LAB — TSH: TSH: 0.414 u[IU]/mL (ref 0.350–4.500)

## 2012-02-23 MED ORDER — ALBUTEROL SULFATE HFA 108 (90 BASE) MCG/ACT IN AERS: 2.0000 | INHALATION_SPRAY | Freq: Four times a day (QID) | RESPIRATORY_TRACT | Status: DC | PRN

## 2012-02-28 ENCOUNTER — Encounter: Payer: Self-pay | Admitting: Urgent Care

## 2012-02-28 ENCOUNTER — Ambulatory Visit (INDEPENDENT_AMBULATORY_CARE_PROVIDER_SITE_OTHER): Payer: Medicare Other | Admitting: Urgent Care

## 2012-02-28 ENCOUNTER — Encounter (HOSPITAL_COMMUNITY): Payer: Self-pay | Admitting: Pharmacy Technician

## 2012-02-28 VITALS — BP 137/89 | HR 88 | Temp 97.9°F | Ht 69.0 in | Wt 150.0 lb

## 2012-02-28 DIAGNOSIS — F458 Other somatoform disorders: Secondary | ICD-10-CM

## 2012-02-28 DIAGNOSIS — R0989 Other specified symptoms and signs involving the circulatory and respiratory systems: Secondary | ICD-10-CM | POA: Insufficient documentation

## 2012-02-28 DIAGNOSIS — R197 Diarrhea, unspecified: Secondary | ICD-10-CM

## 2012-02-28 DIAGNOSIS — K219 Gastro-esophageal reflux disease without esophagitis: Secondary | ICD-10-CM

## 2012-02-28 NOTE — Patient Instructions (Addendum)
EGD (upper endoscopy) with possible esophageal dilation with Dr Jena Gauss Continue Protonix 40mg  daily 1-800-QUIT-NOW for help quitting smoking

## 2012-02-28 NOTE — Progress Notes (Signed)
Primary Care Physician:  Margaret Simpson, MD Primary Gastroenterologist:  Dr. Rourk  Chief Complaint  Patient presents with  . Follow-up    GERD    HPI:  Kelly Douglas is a 56 y.o. female here for follow up for GERD.  She has been having a "lump in her esophagus".  She denies odynophagia.  She feels like a "hunk of food that won't go down".  Denies any problems with liquids.  Last EGD 2011 by Dr Rourk showed atonic esophagus, esophagitis, & antral erosions & a small hiatal hernia.  She has been gaining weight.  .  She is on daily protonix 40mg.  Occasionally she takes BID dosing of PPI at least twice per month.  She has been having some issues with her BP & is seeing PCP for this.  Denies melena or rectal bleeding.  Past Medical History  Diagnosis Date  . GERD (gastroesophageal reflux disease)     Dr Rourk EGD 09/2009->esophagitis, sm HH, antral erosions, atonic esophagus  . Seasonal allergies   . Arthritis   . Scleroderma   . Chronic lung disease     Chronic scarring and volume loss-left lung; characteristics of a chronic infectious process-possible MAI  . Chronic back pain   . Hypothyroid 1981 approx  . Arteriosclerotic cardiovascular disease (ASCVD) 2013    coronary calcification-left main, LAD and CX; small pericardial effusion  . Tobacco abuse 06/24/2009  . Syncope     Multiple spells over the past 40+ years, likely neurocardiogenic  . Hyperlipidemia   . Depression   . Anxiety   . Chronic bronchitis     Past Surgical History  Procedure Laterality Date  . Tubal ligation    . Bilateral salpingoophorectomy  2013     Dr. Eure; uterus remains in situ  . Laparoscopic appendectomy  07/16/2011    Procedure: APPENDECTOMY LAPAROSCOPIC;  Surgeon: Brent C Ziegler, MD;  Location: [facility];  Service: General;  Laterality: N/A;  . Colonoscopy  09/28/09    anal papilla otherwise normal  . Esophagogastroduodenoscopy  10/07/09    Dr. Rourk-->friability of esophageal mucosa, diffusely  ?esophagitis (bx benign), small HH/antral erosions, erythema bx benign. atonic esophagus (?scleraderma esophagus)    Current Outpatient Prescriptions  Medication Sig Dispense Refill  . albuterol (PROVENTIL HFA;VENTOLIN HFA) 108 (90 BASE) MCG/ACT inhaler Inhale 2 puffs into the lungs every 6 (six) hours as needed. Shortness of breath  6.7 g  2  . ALPRAZolam (XANAX) 0.5 MG tablet Take 1 tablet (0.5 mg total) by mouth at bedtime.  30 tablet  2  . amLODipine (NORVASC) 5 MG tablet Take 1 tablet (5 mg total) by mouth daily.  30 tablet  11  . aspirin (ASPIRIN EC) 81 MG EC tablet Take 81 mg by mouth daily. Swallow whole.      . buPROPion (WELLBUTRIN SR) 150 MG 12 hr tablet Take 1 tablet (150 mg total) by mouth 2 (two) times daily.  60 tablet  2  . Cholecalciferol (VITAMIN D3) 1000 UNITS CAPS Take 1 capsule by mouth daily.        . gabapentin (NEURONTIN) 300 MG capsule Take 1 capsule (300 mg total) by mouth 3 (three) times daily.  90 capsule  4  . levothyroxine (SYNTHROID, LEVOTHROID) 75 MCG tablet Take 1 tablet (75 mcg total) by mouth daily.  30 tablet  4  . pantoprazole (PROTONIX) 40 MG tablet Take 40 mg by mouth daily.      . pravastatin (PRAVACHOL) 40 MG tablet Take   1 tablet (40 mg total) by mouth daily.  30 tablet  12  . sertraline (ZOLOFT) 100 MG tablet Take 100 mg by mouth daily.       . tizanidine (ZANAFLEX) 2 MG capsule One tablet at bedtime for back pain  30 capsule  3  . acetaminophen (TYLENOL) 500 MG tablet Take 1,000 mg by mouth every 6 (six) hours as needed for pain.       No current facility-administered medications for this visit.    Allergies as of 02/28/2012  . (No Known Allergies)    Family History  Problem Relation Age of Onset  . Throat cancer Brother   . Stroke Father   . Coronary artery disease Brother   . Down syndrome Brother   . Hypertension Brother     father/mother  . Anesthesia problems Neg Hx   . Hypotension Neg Hx   . Malignant hyperthermia Neg Hx   .  Pseudochol deficiency Neg Hx     History   Social History  . Marital Status: Married    Spouse Name: N/A    Number of Children: 2  . Years of Education: N/A   Occupational History  . disabled    Social History Main Topics  . Smoking status: Current Every Day Smoker -- 0.50 packs/day for 40 years    Types: Cigarettes  . Smokeless tobacco: Not on file  . Alcohol Use: No     Comment: quit June 2012-used to drink 6 pack daily  . Drug Use: No  . Sexually Active: Yes    Birth Control/ Protection: Post-menopausal   Other Topics Concern  . Not on file   Social History Narrative  . No narrative on file    Review of Systems: Gen: See HPI CV: Denies chest pain, angina, palpitations, syncope, orthopnea, PND, peripheral edema, and claudication. Resp: Denies dyspnea at rest, dyspnea with exercise, cough, sputum, wheezing, coughing up blood, and pleurisy. GI: Denies vomiting blood, jaundice, and fecal incontinence.  GU : Denies urinary burning, blood in urine, urinary frequency, urinary hesitancy, nocturnal urination, and urinary incontinence. MS: Denies joint pain, limitation of movement, and swelling, stiffness, low back pain, extremity pain. Denies muscle weakness, cramps, atrophy.  Derm: Denies rash, itching, dry skin, hives, moles, warts, or unhealing ulcers.  Psych: Denies depression, anxiety, memory loss, suicidal ideation, hallucinations, paranoia, and confusion. Heme: Denies bruising, bleeding, and enlarged lymph nodes. Neuro:  Denies any headaches, dizziness, paresthesias. Endo:  Denies any problems with DM, thyroid, adrenal function.  Physical Exam: BP 137/89  Pulse 88  Temp(Src) 97.9 F (36.6 C) (Oral)  Ht 5' 9" (1.753 m)  Wt 150 lb (68.04 kg)  BMI 22.14 kg/m2 No LMP recorded. Patient is postmenopausal. General:   Alert,  Well-developed, well-nourished, pleasant and cooperative in NAD Head:  Normocephalic and atraumatic. Eyes:  Sclera clear, no icterus.    Conjunctiva pink. Ears:  Normal auditory acuity. Nose:  No deformity, discharge, or lesions. Mouth:  Poor dentition,  oropharynx pink & moist. Neck:  Supple; no masses or thyromegaly. Lungs:  Clear throughout to auscultation.   No wheezes, crackles, or rhonchi. No acute distress. Heart:  Regular rate and rhythm; no murmurs, clicks, rubs,  or gallops. Abdomen:  Normal bowel sounds.  No bruits.  Soft, non-tender and non-distended without masses, hepatosplenomegaly or hernias noted.  No guarding or rebound tenderness.   Rectal:  Deferred. Msk:  Symmetrical without gross deformities. Normal posture. Pulses:  Normal pulses noted. Extremities:  No clubbing or   edema. Neurologic:  Alert and  oriented x4;  grossly normal neurologically. Skin:  Intact without significant lesions or rashes. Lymph Nodes:  No significant cervical adenopathy. Psych:  Alert and cooperative. Normal mood and affect.   

## 2012-02-29 NOTE — Assessment & Plan Note (Signed)
EMAAN GARY is a pleasant 57 y.o. female with chronic GERD with occasional breakthrough symptoms on daily Protonix 40mg .  She occasionally takes 2nd dose.  Due to globus & dysphagia further evaluation with EGD warranted.

## 2012-02-29 NOTE — Progress Notes (Signed)
Faxed to PCP

## 2012-02-29 NOTE — Assessment & Plan Note (Signed)
Chronic globus retrosternally.  She feels like lump of food in esophagus, not necessarily worse with eating.  Last EGD showed atonic esophagus & I would question whether this may be culprit.  Direct visualization with EGD by Dr Jena Gauss with esophageal dilation if needed.  I have discussed risks & benefits which include, but are not limited to, bleeding, infection, perforation & drug reaction.  The patient agrees with this plan & written consent will be obtained.

## 2012-03-02 NOTE — Assessment & Plan Note (Signed)
Controlled, no change in medication  

## 2012-03-02 NOTE — Assessment & Plan Note (Signed)
Encouraged to see therapist as most of her symptoms are anxiety related to life stresses, no interest at this time

## 2012-03-02 NOTE — Assessment & Plan Note (Signed)
Unchanged. Patient counseled for approximately 5 minutes regarding the health risks of ongoing nicotine use, specifically all types of cancer, heart disease, stroke and respiratory failure. The options available for help with cessation ,the behavioral changes to assist the process, and the option to either gradully reduce usage  Or abruptly stop.is also discussed. Pt is also encouraged to set specific goals in number of cigarettes used daily, as well as to set a quit date.  

## 2012-03-02 NOTE — Assessment & Plan Note (Signed)
Uncontrolled, new medication prescribed DASH diet and commitment to daily physical activity for a minimum of 30 minutes discussed and encouraged, as a part of hypertension management. The importance of attaining a healthy weight is also discussed.

## 2012-03-02 NOTE — Assessment & Plan Note (Signed)
Uncontrolled with spasm. Toradol and depo medrol in office followed by oral meds

## 2012-03-02 NOTE — Assessment & Plan Note (Signed)
Controlled, no change in medication Hyperlipidemia:Low fat diet discussed and encouraged.  \ 

## 2012-03-05 ENCOUNTER — Ambulatory Visit (INDEPENDENT_AMBULATORY_CARE_PROVIDER_SITE_OTHER): Payer: Medicare Other | Admitting: *Deleted

## 2012-03-05 VITALS — BP 148/86 | HR 89 | Ht 69.0 in | Wt 153.0 lb

## 2012-03-05 DIAGNOSIS — I10 Essential (primary) hypertension: Secondary | ICD-10-CM

## 2012-03-05 NOTE — Progress Notes (Signed)
Presents for a repeat blood pressure check after OV blood pressure of 158/110 on 02/02/2012.  Visit with Dr Lodema Hong on 2/5 revealed similar reading.  Amlodipine 5 mg daily was initiated, by her at that visit.  Pt states taking all medications, as prescribed.  Has been keeping a log of her blood pressures at home with various ranges, for review.  No complaints noted.  States she is attempting to quit smoking and is down to 2 cigarettes a day.

## 2012-03-06 ENCOUNTER — Telehealth: Payer: Self-pay | Admitting: Internal Medicine

## 2012-03-06 NOTE — Telephone Encounter (Signed)
I spoke to Kelly Douglas and asked her if she could change her procedure time from 1:45 to 7:30 am due to Dr. Jena Gauss starting at 7:30 on 02/19 and she agreed

## 2012-03-07 ENCOUNTER — Encounter (HOSPITAL_COMMUNITY): Payer: Self-pay

## 2012-03-07 ENCOUNTER — Ambulatory Visit (HOSPITAL_COMMUNITY)
Admission: RE | Admit: 2012-03-07 | Discharge: 2012-03-07 | Disposition: A | Discharge status: Home Health | Payer: Medicare Other | Source: Ambulatory Visit | Attending: Internal Medicine | Admitting: Internal Medicine

## 2012-03-07 ENCOUNTER — Encounter (HOSPITAL_COMMUNITY)
Admission: RE | Disposition: A | Discharge status: Home Health | Payer: Self-pay | Source: Ambulatory Visit | Attending: Internal Medicine

## 2012-03-07 DIAGNOSIS — R933 Abnormal findings on diagnostic imaging of other parts of digestive tract: Secondary | ICD-10-CM

## 2012-03-07 DIAGNOSIS — K449 Diaphragmatic hernia without obstruction or gangrene: Secondary | ICD-10-CM

## 2012-03-07 DIAGNOSIS — K222 Esophageal obstruction: Secondary | ICD-10-CM | POA: Insufficient documentation

## 2012-03-07 DIAGNOSIS — K219 Gastro-esophageal reflux disease without esophagitis: Secondary | ICD-10-CM

## 2012-03-07 DIAGNOSIS — F458 Other somatoform disorders: Secondary | ICD-10-CM

## 2012-03-07 DIAGNOSIS — R09A2 Foreign body sensation, throat: Secondary | ICD-10-CM

## 2012-03-07 HISTORY — PX: ESOPHAGOGASTRODUODENOSCOPY (EGD) WITH ESOPHAGEAL DILATION: SHX5812

## 2012-03-07 SURGERY — ESOPHAGOGASTRODUODENOSCOPY (EGD) WITH ESOPHAGEAL DILATION
Anesthesia: Moderate Sedation

## 2012-03-07 MED ORDER — SODIUM CHLORIDE 0.45 % IV SOLN
INTRAVENOUS | Status: DC
  Administered 2012-03-07: 07:00:00 via INTRAVENOUS

## 2012-03-07 MED ORDER — STERILE WATER FOR IRRIGATION IR SOLN
Status: DC | PRN
  Administered 2012-03-07: 08:00:00

## 2012-03-07 MED ORDER — MEPERIDINE HCL 100 MG/ML IJ SOLN
INTRAMUSCULAR | Status: DC | PRN
  Administered 2012-03-07 (×2): 50 mg via INTRAVENOUS

## 2012-03-07 MED ORDER — ONDANSETRON HCL 4 MG/2ML IJ SOLN
INTRAMUSCULAR | Status: AC
  Filled 2012-03-07: qty 2

## 2012-03-07 MED ORDER — BUTAMBEN-TETRACAINE-BENZOCAINE 2-2-14 % EX AERO
INHALATION_SPRAY | CUTANEOUS | Status: DC | PRN
  Administered 2012-03-07: 2 via TOPICAL

## 2012-03-07 MED ORDER — MEPERIDINE HCL 100 MG/ML IJ SOLN
INTRAMUSCULAR | Status: AC
  Filled 2012-03-07: qty 2

## 2012-03-07 MED ORDER — MIDAZOLAM HCL 5 MG/5ML IJ SOLN
INTRAMUSCULAR | Status: AC
  Filled 2012-03-07: qty 10

## 2012-03-07 MED ORDER — ONDANSETRON HCL 4 MG/2ML IJ SOLN
INTRAMUSCULAR | Status: DC | PRN
  Administered 2012-03-07: 4 mg via INTRAVENOUS

## 2012-03-07 MED ORDER — MIDAZOLAM HCL 5 MG/5ML IJ SOLN
INTRAMUSCULAR | Status: DC | PRN
  Administered 2012-03-07 (×2): 2 mg via INTRAVENOUS
  Administered 2012-03-07 (×2): 1 mg via INTRAVENOUS

## 2012-03-07 NOTE — Op Note (Signed)
[facility] [address] [place], [address]   ENDOSCOPY PROCEDURE REPORT  PATIENT: Kelly Douglas, Kelly Douglas  MR#: [id number] BIRTHDATE: July 09, 1955 , 56  yrs. old GENDER: Female ENDOSCOPIST: R.  Roetta Sessions, MD FACP FACG REFERRED BY:  Syliva Overman, M.D. PROCEDURE DATE:  03/07/2012 PROCEDURE:     EGD with esophageal biopsy  INDICATIONS:     Refractory GERD; globus  INFORMED CONSENT:   The risks, benefits, limitations, alternatives and imponderables have been discussed.  The potential for biopsy, esophogeal dilation, etc. have also been reviewed.  Questions have been answered.  All parties agreeable.  Please see the history and physical in the medical record for more information.  MEDICATIONS:   Versed 6mg  IV and Demerol 100 mg IV in divided doses. Cetacaine spray. Zofran 4 mg IV  DESCRIPTION OF PROCEDURE:   The EC-[id number] ([id number]) and Pentax Gastroscope [id number]  endoscope was introduced through the mouth and advanced to the second portion of the duodenum without difficulty or limitations.  The mucosal surfaces were surveyed very carefully during advancement of the scope and upon withdrawal.  Retroflexion view of the proximal stomach and esophagogastric junction was performed.      FINDINGS:   Somewhat nodular appearing esophageal mucosa with subtle "ringed" appearance.  Patient had a noncritical Schatzki's ring. No esophagitis. The tubular esophagus remained wildly patent through the GE junction. Stomach empty. Small hiatal hernia. Abnormal gastric mucosa.  Patent pylorus. Normal first and second portion of the duodenum  THERAPEUTIC / DIAGNOSTIC MANEUVERS PERFORMED:  I attempted to pass a 54 [place] dilator. Patient's oral cavity was very small. She was uncooperative as well. The dilator not place beyond the hypopharynx.  I did not feel further attempts would be productive. Subsequently, I reintroduced the scope into the esophagus and biopsied  it.   COMPLICATIONS:  None  IMPRESSION:  Abnormal, patent, tubular esophagus of uncertain significance-status post biopsy. Hiatal hernia.  RECOMMENDATIONS:  I will follow-up on pathology prior to making further recommendations    _______________________________ R. Roetta Sessions, MD FACP [facility] eSigned:  R. Roetta Sessions, MD FACP [facility] 03/07/2012 8:19 AM     CC:

## 2012-03-07 NOTE — H&P (View-Only) (Signed)
Primary Care Physician:  Syliva Overman, MD Primary Gastroenterologist:  Dr. Jena Gauss  Chief Complaint  Patient presents with  . Follow-up    GERD    HPI:  Kelly Douglas is a 57 y.o. female here for follow up for GERD.  She has been having a "lump in her esophagus".  She denies odynophagia.  She feels like a "hunk of food that won't go down".  Denies any problems with liquids.  Last EGD 2011 by Dr Jena Gauss showed atonic esophagus, esophagitis, & antral erosions & a small hiatal hernia.  She has been gaining weight.  .  She is on daily protonix 40mg .  Occasionally she takes BID dosing of PPI at least twice per month.  She has been having some issues with her BP & is seeing PCP for this.  Denies melena or rectal bleeding.  Past Medical History  Diagnosis Date  . GERD (gastroesophageal reflux disease)     Dr Jena Gauss EGD 09/2009->esophagitis, sm HH, antral erosions, atonic esophagus  . Seasonal allergies   . Arthritis   . Scleroderma   . Chronic lung disease     Chronic scarring and volume loss-left lung; characteristics of a chronic infectious process-possible MAI  . Chronic back pain   . Hypothyroid 1981 approx  . Arteriosclerotic cardiovascular disease (ASCVD) 2013    coronary calcification-left main, LAD and CX; small pericardial effusion  . Tobacco abuse 06/24/2009  . Syncope     Multiple spells over the past 40+ years, likely neurocardiogenic  . Hyperlipidemia   . Depression   . Anxiety   . Chronic bronchitis     Past Surgical History  Procedure Laterality Date  . Tubal ligation    . Bilateral salpingoophorectomy  2013     Dr. Despina Hidden; uterus remains in situ  . Laparoscopic appendectomy  07/16/2011    Procedure: APPENDECTOMY LAPAROSCOPIC;  Surgeon: Fabio Bering, MD;  Location: [facility];  Service: General;  Laterality: N/A;  . Colonoscopy  09/28/09    anal papilla otherwise normal  . Esophagogastroduodenoscopy  10/07/09    Dr. Harless Nakayama of esophageal mucosa, diffusely  ?esophagitis (bx benign), small HH/antral erosions, erythema bx benign. atonic esophagus (?scleraderma esophagus)    Current Outpatient Prescriptions  Medication Sig Dispense Refill  . albuterol (PROVENTIL HFA;VENTOLIN HFA) 108 (90 BASE) MCG/ACT inhaler Inhale 2 puffs into the lungs every 6 (six) hours as needed. Shortness of breath  6.7 g  2  . ALPRAZolam (XANAX) 0.5 MG tablet Take 1 tablet (0.5 mg total) by mouth at bedtime.  30 tablet  2  . amLODipine (NORVASC) 5 MG tablet Take 1 tablet (5 mg total) by mouth daily.  30 tablet  11  . aspirin (ASPIRIN EC) 81 MG EC tablet Take 81 mg by mouth daily. Swallow whole.      Marland Kitchen buPROPion (WELLBUTRIN SR) 150 MG 12 hr tablet Take 1 tablet (150 mg total) by mouth 2 (two) times daily.  60 tablet  2  . Cholecalciferol (VITAMIN D3) 1000 UNITS CAPS Take 1 capsule by mouth daily.        Marland Kitchen gabapentin (NEURONTIN) 300 MG capsule Take 1 capsule (300 mg total) by mouth 3 (three) times daily.  90 capsule  4  . levothyroxine (SYNTHROID, LEVOTHROID) 75 MCG tablet Take 1 tablet (75 mcg total) by mouth daily.  30 tablet  4  . pantoprazole (PROTONIX) 40 MG tablet Take 40 mg by mouth daily.      . pravastatin (PRAVACHOL) 40 MG tablet Take  1 tablet (40 mg total) by mouth daily.  30 tablet  12  . sertraline (ZOLOFT) 100 MG tablet Take 100 mg by mouth daily.       . tizanidine (ZANAFLEX) 2 MG capsule One tablet at bedtime for back pain  30 capsule  3  . acetaminophen (TYLENOL) 500 MG tablet Take 1,000 mg by mouth every 6 (six) hours as needed for pain.       No current facility-administered medications for this visit.    Allergies as of 02/28/2012  . (No Known Allergies)    Family History  Problem Relation Age of Onset  . Throat cancer Brother   . Stroke Father   . Coronary artery disease Brother   . Down syndrome Brother   . Hypertension Brother     father/mother  . Anesthesia problems Neg Hx   . Hypotension Neg Hx   . Malignant hyperthermia Neg Hx   .  Pseudochol deficiency Neg Hx     History   Social History  . Marital Status: Married    Spouse Name: N/A    Number of Children: 2  . Years of Education: N/A   Occupational History  . disabled    Social History Main Topics  . Smoking status: Current Every Day Smoker -- 0.50 packs/day for 40 years    Types: Cigarettes  . Smokeless tobacco: Not on file  . Alcohol Use: No     Comment: quit June 2012-used to drink 6 pack daily  . Drug Use: No  . Sexually Active: Yes    Birth Control/ Protection: Post-menopausal   Other Topics Concern  . Not on file   Social History Narrative  . No narrative on file    Review of Systems: Gen: See HPI CV: Denies chest pain, angina, palpitations, syncope, orthopnea, PND, peripheral edema, and claudication. Resp: Denies dyspnea at rest, dyspnea with exercise, cough, sputum, wheezing, coughing up blood, and pleurisy. GI: Denies vomiting blood, jaundice, and fecal incontinence.  GU : Denies urinary burning, blood in urine, urinary frequency, urinary hesitancy, nocturnal urination, and urinary incontinence. MS: Denies joint pain, limitation of movement, and swelling, stiffness, low back pain, extremity pain. Denies muscle weakness, cramps, atrophy.  Derm: Denies rash, itching, dry skin, hives, moles, warts, or unhealing ulcers.  Psych: Denies depression, anxiety, memory loss, suicidal ideation, hallucinations, paranoia, and confusion. Heme: Denies bruising, bleeding, and enlarged lymph nodes. Neuro:  Denies any headaches, dizziness, paresthesias. Endo:  Denies any problems with DM, thyroid, adrenal function.  Physical Exam: BP 137/89  Pulse 88  Temp(Src) 97.9 F (36.6 C) (Oral)  Ht 5\' 9"  (1.753 m)  Wt 150 lb (68.04 kg)  BMI 22.14 kg/m2 No LMP recorded. Patient is postmenopausal. General:   Alert,  Well-developed, well-nourished, pleasant and cooperative in NAD Head:  Normocephalic and atraumatic. Eyes:  Sclera clear, no icterus.    Conjunctiva pink. Ears:  Normal auditory acuity. Nose:  No deformity, discharge, or lesions. Mouth:  Poor dentition,  oropharynx pink & moist. Neck:  Supple; no masses or thyromegaly. Lungs:  Clear throughout to auscultation.   No wheezes, crackles, or rhonchi. No acute distress. Heart:  Regular rate and rhythm; no murmurs, clicks, rubs,  or gallops. Abdomen:  Normal bowel sounds.  No bruits.  Soft, non-tender and non-distended without masses, hepatosplenomegaly or hernias noted.  No guarding or rebound tenderness.   Rectal:  Deferred. Msk:  Symmetrical without gross deformities. Normal posture. Pulses:  Normal pulses noted. Extremities:  No clubbing or  edema. Neurologic:  Alert and  oriented x4;  grossly normal neurologically. Skin:  Intact without significant lesions or rashes. Lymph Nodes:  No significant cervical adenopathy. Psych:  Alert and cooperative. Normal mood and affect.

## 2012-03-07 NOTE — Interval H&P Note (Signed)
History and Physical Interval Note:  03/07/2012 7:36 AM  Harmon Pier  has presented today for surgery, with the diagnosis of GERD AND GLOBUS SENSATION  The various methods of treatment have been discussed with the patient and family. After consideration of risks, benefits and other options for treatment, the patient has consented to  Procedure(s) with comments: ESOPHAGOGASTRODUODENOSCOPY (EGD) WITH ESOPHAGEAL DILATION (N/A) - 1:45  2:00 per schedule /kr as a surgical intervention .  The patient's history has been reviewed, patient examined, no change in status, stable for surgery.  I have reviewed the patient's chart and labs.  Questions were answered to the patient's satisfaction.     Eula Listen  Patient absolutely denies any dysphagia to pills or solid food or liquids. States she can take pills without even drinking any liquids. EGD today per plan.The risks, benefits, limitations, alternatives and imponderables have been reviewed with the patient. Potential for esophageal dilation, biopsy, etc. have also been reviewed.  Questions have been answered. All parties agreeable.

## 2012-03-08 NOTE — Progress Notes (Signed)
Patient ID: Kelly Douglas, female   DOB: November 26, 1955, 57 y.o.   MRN: [id number]  BP log with approximately 25 entries. Early in the month, systolics frequently exceeded 140 and multiple diastolics were recorded in the 90-106 range. During the past 10 days, there has been substantial improvement with systolics between 105-140 and all diastolics less than or equal to 96.  Patient to continue to monitor home blood pressures and return list in one month.

## 2012-03-09 ENCOUNTER — Other Ambulatory Visit: Payer: Self-pay

## 2012-03-09 MED ORDER — OXYCODONE-ACETAMINOPHEN 5-325 MG PO TABS: ORAL_TABLET | ORAL | Status: DC

## 2012-03-10 ENCOUNTER — Encounter: Payer: Self-pay | Admitting: Internal Medicine

## 2012-03-12 ENCOUNTER — Encounter (HOSPITAL_COMMUNITY): Payer: Self-pay | Admitting: Internal Medicine

## 2012-03-13 ENCOUNTER — Encounter: Payer: Self-pay | Admitting: *Deleted

## 2012-03-14 ENCOUNTER — Other Ambulatory Visit: Payer: Self-pay | Admitting: Family Medicine

## 2012-04-03 ENCOUNTER — Ambulatory Visit (HOSPITAL_COMMUNITY): Payer: Medicare Other

## 2012-04-09 ENCOUNTER — Ambulatory Visit (HOSPITAL_COMMUNITY)
Admission: RE | Admit: 2012-04-09 | Discharge: 2012-04-09 | Disposition: A | Discharge status: Home / Self Care | Payer: Medicare Other | Source: Ambulatory Visit | Attending: Family Medicine | Admitting: Family Medicine

## 2012-04-09 ENCOUNTER — Encounter: Payer: Self-pay | Admitting: Internal Medicine

## 2012-04-09 DIAGNOSIS — IMO0001 Reserved for inherently not codable concepts without codable children: Secondary | ICD-10-CM

## 2012-04-09 DIAGNOSIS — Z1231 Encounter for screening mammogram for malignant neoplasm of breast: Secondary | ICD-10-CM | POA: Insufficient documentation

## 2012-04-11 ENCOUNTER — Ambulatory Visit: Payer: Medicare Other | Admitting: Gastroenterology

## 2012-04-14 ENCOUNTER — Other Ambulatory Visit: Payer: Self-pay | Admitting: Family Medicine

## 2012-04-25 ENCOUNTER — Ambulatory Visit (INDEPENDENT_AMBULATORY_CARE_PROVIDER_SITE_OTHER): Payer: Medicare Other | Admitting: Gastroenterology

## 2012-04-25 ENCOUNTER — Encounter: Payer: Self-pay | Admitting: Gastroenterology

## 2012-04-25 VITALS — BP 132/86 | HR 89 | Temp 98.0°F | Ht 68.0 in | Wt 158.2 lb

## 2012-04-25 DIAGNOSIS — R252 Cramp and spasm: Secondary | ICD-10-CM

## 2012-04-25 DIAGNOSIS — K219 Gastro-esophageal reflux disease without esophagitis: Secondary | ICD-10-CM

## 2012-04-25 DIAGNOSIS — R197 Diarrhea, unspecified: Secondary | ICD-10-CM

## 2012-04-25 DIAGNOSIS — R718 Other abnormality of red blood cells: Secondary | ICD-10-CM

## 2012-04-25 MED ORDER — DEXLANSOPRAZOLE 60 MG PO CPDR: 60.0000 mg | DELAYED_RELEASE_CAPSULE | Freq: Every day | ORAL | Status: DC

## 2012-04-25 NOTE — Assessment & Plan Note (Signed)
Recent EGD with abnormal-appearing esophagus, biopsies were negative. Esophagus was patent. Empirical dilator could not be passed due to patient's oral cavity being very small and she was uncooperative. Really overall patient is not having any swallowing difficulties. She does have the feeling of a lump in her throat. Some breakthrough heartburn at times. As previously recommended by Dr. Jena Gauss, we will switch her PPI to see if we get any leverage with control of her symptoms. Stop Protonix and begin Dexilant. Samples and RX provided.

## 2012-04-25 NOTE — Progress Notes (Signed)
Forwarded to PCP.

## 2012-04-25 NOTE — Assessment & Plan Note (Signed)
Notable the past one year. Check iron and ferritin levels.

## 2012-04-25 NOTE — Progress Notes (Signed)
Primary Care Physician: Syliva Overman, MD  Primary Gastroenterologist:  Roetta Sessions, MD   Chief Complaint  Patient presents with  . Follow-up    HPI: Kelly Douglas is a 57 y.o. female here for followup. She was last seen in 02/2012 for GERD and feeling of food sticking in her esophagus. Recent EGD showed patent but "ringed" appearing esophagus. Dilation could not be performed due to small oropharynx and inability to pass dilator. Bx of esophagus were benign.  Patient states she has not had much issues with swallowing. Sometimes feel like there's a lump in her throat and won't allow her to belch. Dr. Jena Gauss mentioned switching her PPI but this did not get done. Previously failed AcipHex. Complains of a couple of weeks of cramping in arms/legs. Last few weeks watery stools, but none in 3-4 days. Diarrhea usually lasts for less than 24 hours. No melena or rectal bleeding. At times has worn depends due to the diarrhea. Denies any recent antibiotics, ill contacts, travel.   Current Outpatient Prescriptions  Medication Sig Dispense Refill  . acetaminophen (TYLENOL) 500 MG tablet Take 1,000 mg by mouth every 6 (six) hours as needed for pain.      Marland Kitchen albuterol (PROVENTIL HFA;VENTOLIN HFA) 108 (90 BASE) MCG/ACT inhaler Inhale 2 puffs into the lungs every 6 (six) hours as needed. Shortness of breath  6.7 g  2  . ALPRAZolam (XANAX) 0.5 MG tablet Take 1 tablet (0.5 mg total) by mouth at bedtime.  30 tablet  2  . aspirin (ASPIRIN EC) 81 MG EC tablet Take 81 mg by mouth daily. Swallow whole.      Marland Kitchen buPROPion (WELLBUTRIN SR) 150 MG 12 hr tablet Take 1 tablet (150 mg total) by mouth 2 (two) times daily.  60 tablet  2  . Cholecalciferol (VITAMIN D3) 1000 UNITS CAPS Take 1 capsule by mouth daily.        Marland Kitchen gabapentin (NEURONTIN) 300 MG capsule Take 1 capsule (300 mg total) by mouth 3 (three) times daily.  90 capsule  4  . levothyroxine (SYNTHROID, LEVOTHROID) 75 MCG tablet TAKE 1 TABLET BY MOUTH DAILY  30  tablet  3  . oxyCODONE-acetaminophen (ROXICET) 5-325 MG per tablet One tablet once daily, as needed for back pain. Discontinue vicodin  30 tablet  0  . pantoprazole (PROTONIX) 40 MG tablet Take 40 mg by mouth daily.      . pravastatin (PRAVACHOL) 40 MG tablet Take 1 tablet (40 mg total) by mouth daily.  30 tablet  12  . sertraline (ZOLOFT) 100 MG tablet TAKE 1 TABLET BY MOUTH EVERY DAY  30 tablet  1  . tizanidine (ZANAFLEX) 2 MG capsule One tablet at bedtime for back pain  30 capsule  3   No current facility-administered medications for this visit.    Allergies as of 04/25/2012  . (No Known Allergies)    ROS:  General: Negative for anorexia, weight loss, fever, chills, fatigue, weakness. ENT: Negative for hoarseness, difficulty swallowing , nasal congestion. CV: Negative for chest pain, angina, palpitations, dyspnea on exertion, peripheral edema.  Respiratory: Negative for dyspnea at rest, dyspnea on exertion, cough, sputum, wheezing.  GI: See history of present illness. GU:  Negative for dysuria, hematuria, urinary incontinence, urinary frequency, nocturnal urination.  Endo: Negative for unusual weight change.    Physical Examination:   BP 132/86  Pulse 89  Temp(Src) 98 F (36.7 C) (Oral)  Ht 5\' 8"  (1.727 m)  Wt 158 lb 3.2 oz (71.759 kg)  BMI 24.06 kg/m2  General: Well-nourished, well-developed in no acute distress.  Eyes: No icterus. Mouth: Oropharyngeal mucosa moist and pink , no lesions erythema or exudate. Lungs: Clear to auscultation bilaterally.  Heart: Regular rate and rhythm, no murmurs rubs or gallops.  Abdomen: Bowel sounds are normal, nontender, nondistended, no hepatosplenomegaly or masses, no abdominal bruits or hernia , no rebound or guarding.   Extremities: No lower extremity edema. No clubbing or deformities. Neuro: Alert and oriented x 4   Skin: Warm and dry, no jaundice.   Psych: Alert and cooperative, normal mood and affect.  Labs:  Lab Results   Component Value Date   TSH 0.414 02/22/2012   Lab Results  Component Value Date   CREATININE 0.92 02/13/2012   BUN 6 02/13/2012   NA 140 02/13/2012   K 4.1 02/13/2012   CL 105 02/13/2012   CO2 28 02/13/2012   Lab Results  Component Value Date   ALT 8 02/13/2012   AST 14 02/13/2012   ALKPHOS 106 02/13/2012   BILITOT 0.6 02/13/2012   Lab Results  Component Value Date   WBC 8.3 02/13/2012   HGB 12.9 02/13/2012   HCT 39.4 02/13/2012   MCV 75.3* 02/13/2012   PLT 277 02/13/2012

## 2012-04-25 NOTE — Patient Instructions (Addendum)
Please have your lab work done. Please stop Protonix. Start Dexilant one capsule 30 minutes before breakfast daily. Call if your diarrhea returns.

## 2012-04-25 NOTE — Assessment & Plan Note (Signed)
Recent diarrhea, possibly resolved. She'll call if any recurrence. At that time would consider stool studies.

## 2012-04-25 NOTE — Assessment & Plan Note (Signed)
Check potassium and magnesium level.

## 2012-04-26 ENCOUNTER — Telehealth: Payer: Self-pay

## 2012-04-26 NOTE — Telephone Encounter (Signed)
Pt called to let us know the diarrhea has started back. What does she need to do? Please advise

## 2012-04-27 NOTE — Telephone Encounter (Signed)
Pt is aware and will come by to pick up the stool container,

## 2012-04-27 NOTE — Telephone Encounter (Signed)
Please send stool for GI pathogen panel.

## 2012-05-01 LAB — POTASSIUM: Potassium: 4.4 mEq/L (ref 3.5–5.3)

## 2012-05-01 LAB — MAGNESIUM: Magnesium: 1.9 mg/dL (ref 1.5–2.5)

## 2012-05-08 NOTE — Progress Notes (Signed)
Quick Note:  Mg and K are normal. Iron and ferritin not done for some reason. Please see if pt will go back to lab and get done. Did pt bring back specimen for GI pathogen panel? ______

## 2012-05-09 NOTE — Progress Notes (Signed)
Quick Note:  GI pathogen panel is negative. Have patient due iron and ferritin. How is she doing? ______

## 2012-05-13 ENCOUNTER — Other Ambulatory Visit: Payer: Self-pay | Admitting: Cardiology

## 2012-05-16 NOTE — Progress Notes (Signed)
Quick Note:  Mailed letter to pt with lab orders. ______ 

## 2012-05-21 LAB — IRON AND TIBC: %SAT: 16 % — ABNORMAL LOW (ref 20–55)

## 2012-05-23 NOTE — Progress Notes (Signed)
Quick Note:  Low end of normal iron/ferritin in setting of microcytosis. Last TCS 2011.  Please have her complete ifobt. ______

## 2012-05-31 ENCOUNTER — Ambulatory Visit (INDEPENDENT_AMBULATORY_CARE_PROVIDER_SITE_OTHER): Payer: Medicare Other | Admitting: Gastroenterology

## 2012-05-31 DIAGNOSIS — R197 Diarrhea, unspecified: Secondary | ICD-10-CM

## 2012-05-31 LAB — IFOBT (OCCULT BLOOD): IFOBT: POSITIVE

## 2012-06-01 ENCOUNTER — Encounter: Payer: Self-pay | Admitting: Gastroenterology

## 2012-06-01 ENCOUNTER — Encounter: Payer: Self-pay | Admitting: Family Medicine

## 2012-06-01 ENCOUNTER — Ambulatory Visit (INDEPENDENT_AMBULATORY_CARE_PROVIDER_SITE_OTHER): Payer: Medicare Other | Admitting: Family Medicine

## 2012-06-01 VITALS — BP 136/82 | HR 97 | Resp 18 | Ht 69.0 in | Wt 160.1 lb

## 2012-06-01 DIAGNOSIS — R040 Epistaxis: Secondary | ICD-10-CM

## 2012-06-01 DIAGNOSIS — Z720 Tobacco use: Secondary | ICD-10-CM

## 2012-06-01 DIAGNOSIS — G4700 Insomnia, unspecified: Secondary | ICD-10-CM

## 2012-06-01 DIAGNOSIS — E039 Hypothyroidism, unspecified: Secondary | ICD-10-CM

## 2012-06-01 DIAGNOSIS — F17200 Nicotine dependence, unspecified, uncomplicated: Secondary | ICD-10-CM

## 2012-06-01 DIAGNOSIS — F419 Anxiety disorder, unspecified: Secondary | ICD-10-CM

## 2012-06-01 DIAGNOSIS — E785 Hyperlipidemia, unspecified: Secondary | ICD-10-CM

## 2012-06-01 DIAGNOSIS — F341 Dysthymic disorder: Secondary | ICD-10-CM

## 2012-06-01 MED ORDER — TEMAZEPAM 15 MG PO CAPS: 15.0000 mg | ORAL_CAPSULE | Freq: Every evening | ORAL | Status: DC | PRN

## 2012-06-01 MED ORDER — LEVOTHYROXINE SODIUM 75 MCG PO TABS: ORAL_TABLET | ORAL | Status: DC

## 2012-06-01 NOTE — Progress Notes (Signed)
  Subjective:    Patient ID: Kelly Douglas, female    DOB: 11-21-1955, 57 y.o.   MRN: [id number]  HPI The PT is here for follow up and re-evaluation of chronic medical conditions, medication management and review of any available recent lab and radiology data.  Preventive health is updated, specifically  Cancer screening and Immunization.   Questions or concerns regarding consultations or procedures which the PT has had in the interim are  addressed. The PT denies any adverse reactions to current medications since the last visit.  Approx 6 week h/o left epistaxis which is unprovoked by trauma       Review of Systems See HPI Denies recent fever or chills. Denies sinus pressure, nasal congestion, ear pain or sore throat. Denies chest congestion, productive cough or wheezing. Denies chest pains, palpitations and leg swelling Denies abdominal pain, nausea, vomiting,diarrhea or constipation.   Denies dysuria, frequency, hesitancy or incontinence. Denies joint pain, swelling and limitation in mobility. Denies headaches, seizures, numbness, or tingling. Denies uncontrolled depression or anxiety does c/o disabling insomnia avg sleep duratin 3 to4 hrs Denies skin break down or rash.        Objective:   Physical Exam Patient alert and oriented and in no cardiopulmonary distress.  HEENT: No facial asymmetry, EOMI, no sinus tenderness,  oropharynx pink and moist.  Neck supple no adenopathy.No trauma or visible blood in either nostril  Chest: Clear to auscultation bilaterally.Decreased air entry bilaterally  CVS: S1, S2 no murmurs, no S3.  ABD: Soft non tender. Bowel sounds normal.  Ext: No edema  MS: Adequate ROM spine, shoulders, hips and knees.  Skin: Intact, no ulcerations or rash noted.  Psych: Good eye contact, normal affect. Memory intact not anxious or depressed appearing.  CNS: CN 2-12 intact, power, tone and sensation normal throughout.        Assessment &  Plan:

## 2012-06-01 NOTE — Patient Instructions (Addendum)
F/u in 5 month, call if you need me before.  You are referred to Dr Suszanne Conners re bleeding from left nostril   We will research billing of office visits you mentioned and the office or billing will get back to you.   You need to take calcium with D 600mg  one twice daily for bones.  You will get information on good sleep habits, and new medication to help with sleep , restoril is also prescribed  You need to stop smoking, information to help with this is provided, also information about classes at the health dpartment

## 2012-06-03 NOTE — Assessment & Plan Note (Signed)
Sleep hygiene reviewed. Pt to start restoril, avg sleep duration currently approx 3 to 4 hours

## 2012-06-03 NOTE — Assessment & Plan Note (Signed)
Refer ENT for further eval

## 2012-06-03 NOTE — Assessment & Plan Note (Signed)
Improving, wants to quit. Patient counseled for approximately 5 minutes regarding the health risks of ongoing nicotine use, specifically all types of cancer, heart disease, stroke and respiratory failure. The options available for help with cessation ,the behavioral changes to assist the process, and the option to either gradully reduce usage  Or abruptly stop.is also discussed. Pt is also encouraged to set specific goals in number of cigarettes used daily, as well as to set a quit date.

## 2012-06-03 NOTE — Assessment & Plan Note (Signed)
Controlled, no change in medication Hyperlipidemia:Low fat diet discussed and encouraged.  \ 

## 2012-06-03 NOTE — Assessment & Plan Note (Signed)
Controlled, no change in medication  

## 2012-06-05 NOTE — Progress Notes (Signed)
See result note.  

## 2012-06-18 ENCOUNTER — Other Ambulatory Visit: Payer: Self-pay | Admitting: Cardiology

## 2012-06-19 NOTE — Progress Notes (Signed)
Quick Note:  Pt is aware, she is requesting an ov in July because her husband has had surgery and needs her right now.   Darl Pikes, please schedule pt ov in July. Thanks. ______

## 2012-06-19 NOTE — Progress Notes (Signed)
Quick Note:  Please let patient know I'm sorry for delay in getting back in touch with her. I discussed with Dr. Jena Gauss, patient has had low iron, now with positive iFOBT, last TCS 2011. Dr. Jena Gauss recommends colonoscopy. If patient agreeable, she will need OV to make arrangements. ______

## 2012-06-21 ENCOUNTER — Ambulatory Visit (INDEPENDENT_AMBULATORY_CARE_PROVIDER_SITE_OTHER): Payer: Medicare Other | Admitting: Otolaryngology

## 2012-06-21 DIAGNOSIS — R040 Epistaxis: Secondary | ICD-10-CM

## 2012-07-12 ENCOUNTER — Ambulatory Visit (INDEPENDENT_AMBULATORY_CARE_PROVIDER_SITE_OTHER): Payer: Medicare Other | Admitting: Otolaryngology

## 2012-07-12 DIAGNOSIS — R040 Epistaxis: Secondary | ICD-10-CM

## 2012-07-19 ENCOUNTER — Other Ambulatory Visit: Payer: Self-pay | Admitting: Cardiology

## 2012-07-19 NOTE — Telephone Encounter (Signed)
Medication sent via escribe for pravastatin (PRAVACHOL) 40 MG tablet.

## 2012-08-02 ENCOUNTER — Ambulatory Visit (INDEPENDENT_AMBULATORY_CARE_PROVIDER_SITE_OTHER): Payer: Medicare Other | Admitting: Family Medicine

## 2012-08-02 ENCOUNTER — Encounter: Payer: Self-pay | Admitting: Family Medicine

## 2012-08-02 VITALS — BP 118/72 | HR 94 | Temp 98.7°F | Resp 16 | Ht 69.0 in | Wt 162.0 lb

## 2012-08-02 DIAGNOSIS — F17200 Nicotine dependence, unspecified, uncomplicated: Secondary | ICD-10-CM

## 2012-08-02 DIAGNOSIS — J209 Acute bronchitis, unspecified: Secondary | ICD-10-CM

## 2012-08-02 DIAGNOSIS — M549 Dorsalgia, unspecified: Secondary | ICD-10-CM

## 2012-08-02 DIAGNOSIS — G8929 Other chronic pain: Secondary | ICD-10-CM

## 2012-08-02 DIAGNOSIS — K219 Gastro-esophageal reflux disease without esophagitis: Secondary | ICD-10-CM

## 2012-08-02 DIAGNOSIS — I10 Essential (primary) hypertension: Secondary | ICD-10-CM

## 2012-08-02 DIAGNOSIS — Z720 Tobacco use: Secondary | ICD-10-CM

## 2012-08-02 DIAGNOSIS — J019 Acute sinusitis, unspecified: Secondary | ICD-10-CM

## 2012-08-02 MED ORDER — PENICILLIN V POTASSIUM 500 MG PO TABS: 500.0000 mg | ORAL_TABLET | Freq: Three times a day (TID) | ORAL | Status: DC

## 2012-08-02 MED ORDER — GABAPENTIN 300 MG PO CAPS: 300.0000 mg | ORAL_CAPSULE | Freq: Three times a day (TID) | ORAL | Status: DC

## 2012-08-02 MED ORDER — BENZONATATE 100 MG PO CAPS: 100.0000 mg | ORAL_CAPSULE | Freq: Four times a day (QID) | ORAL | Status: DC | PRN

## 2012-08-02 MED ORDER — TIZANIDINE HCL 2 MG PO TABS: 2.0000 mg | ORAL_TABLET | Freq: Every evening | ORAL | Status: DC | PRN

## 2012-08-02 MED ORDER — CEFTRIAXONE SODIUM 1 G IJ SOLR
500.0000 mg | Freq: Once | INTRAMUSCULAR | Status: AC
  Administered 2012-08-02: 500 mg via INTRAMUSCULAR

## 2012-08-02 MED ORDER — ALBUTEROL SULFATE HFA 108 (90 BASE) MCG/ACT IN AERS: 2.0000 | INHALATION_SPRAY | Freq: Four times a day (QID) | RESPIRATORY_TRACT | Status: DC | PRN

## 2012-08-02 MED ORDER — RANITIDINE HCL 150 MG PO TABS: 150.0000 mg | ORAL_TABLET | Freq: Two times a day (BID) | ORAL | Status: DC

## 2012-08-02 NOTE — Patient Instructions (Addendum)
F/u as before, pls call if you need to see me sooner   You will be treated for acute sinusitis and bronchitis  You will be referred to Dr Eduard Clos for epidural injection  New additional med for heartburn is zantac,( ranitidine) twice daily  Rocephin 500mg  IM today in the office

## 2012-08-05 NOTE — Assessment & Plan Note (Signed)
Decongestant and antibiotic prescribed, and Rocephin administered in the office

## 2012-08-05 NOTE — Progress Notes (Signed)
  Subjective:    Patient ID: Kelly Douglas, female    DOB: Oct 11, 1955, 57 y.o.   MRN: [id number]  HPI 4 day h/o head and chest congestion, green nasal drainage and green sputum chills, malaise, no fever documented. Headache due to sinus pressure and excessive cough   Review of Systems See HPI . Denies chest pains, palpitations and leg swelling Denies abdominal pain, nausea, vomiting,diarrhea or constipation.   Denies dysuria, frequency, hesitancy or incontinence. Chronic back pain slightly increased with cough Denies  seizures, numbness, or tingling. Denies ucnontrolled depression, anxiety or insomnia. Denies skin break down or rash.        Objective:   Physical Exam  Patient alert and oriented and in no cardiopulmonary distress.  HEENT: No facial asymmetry, EOMI, frontal and maxillary  sinus tenderness,  oropharynx pink and moist.  Neck supple bilateral anterior adenopathy.  Chest: decreased though adequate air entry, scattered crackles, no wheezes CVS: S1, S2 no murmurs, no S3.  ABD: Soft non tender. Bowel sounds normal.  Ext: No edema  MS: decreased ROM spine,adequate in  shoulders, hips and knees.  Skin: Intact, no ulcerations or rash noted.  Psych: Good eye contact, normal affect. Memory intact not anxious or depressed appearing.  CNS: CN 2-12 intact, power, tone and sensation normal throughout.       Assessment & Plan:

## 2012-08-05 NOTE — Assessment & Plan Note (Signed)
Antibiotic course prescribed 

## 2012-08-05 NOTE — Assessment & Plan Note (Signed)
Controlled, no change in medication  

## 2012-08-05 NOTE — Assessment & Plan Note (Signed)
Unchanged. Patient counseled for approximately 5 minutes regarding the health risks of ongoing nicotine use, specifically all types of cancer, heart disease, stroke and respiratory failure. The options available for help with cessation ,the behavioral changes to assist the process, and the option to either gradully reduce usage  Or abruptly stop.is also discussed. Pt is also encouraged to set specific goals in number of cigarettes used daily, as well as to set a quit date.  

## 2012-08-05 NOTE — Assessment & Plan Note (Signed)
Unchanged, continue meds as before 

## 2012-08-10 ENCOUNTER — Encounter: Payer: Self-pay | Admitting: Gastroenterology

## 2012-08-10 ENCOUNTER — Ambulatory Visit (INDEPENDENT_AMBULATORY_CARE_PROVIDER_SITE_OTHER): Payer: Medicare Other | Admitting: Gastroenterology

## 2012-08-10 VITALS — BP 120/75 | HR 85 | Temp 98.4°F | Ht 68.0 in | Wt 166.2 lb

## 2012-08-10 DIAGNOSIS — R195 Other fecal abnormalities: Secondary | ICD-10-CM | POA: Insufficient documentation

## 2012-08-10 DIAGNOSIS — R109 Unspecified abdominal pain: Secondary | ICD-10-CM

## 2012-08-10 DIAGNOSIS — D509 Iron deficiency anemia, unspecified: Secondary | ICD-10-CM | POA: Insufficient documentation

## 2012-08-10 DIAGNOSIS — K219 Gastro-esophageal reflux disease without esophagitis: Secondary | ICD-10-CM

## 2012-08-10 MED ORDER — PEG 3350-KCL-NA BICARB-NACL 420 G PO SOLR: 4000.0000 mL | ORAL | Status: DC

## 2012-08-10 MED ORDER — LANSOPRAZOLE 30 MG PO CPDR: 30.0000 mg | DELAYED_RELEASE_CAPSULE | Freq: Every day | ORAL | Status: DC

## 2012-08-10 NOTE — Patient Instructions (Addendum)
1. When you complete pantoprazole, start generic Prevacid to see if it better controls your heartburn. Prescription sent to your pharmacy. 2. We have scheduled you for a colonoscopy. Please see separate instructions.

## 2012-08-10 NOTE — Progress Notes (Signed)
Primary Care Physician: Syliva Overman, MD  Primary Gastroenterologist:  Roetta Sessions, MD   Chief Complaint  Patient presents with  . Follow-up  . Colonoscopy    HPI: Kelly Douglas is a 57 y.o. female here to set up colonoscopy for low iron and heme positive stool. Her last TCS was in 2011. Unremarkable at that time. EGD 02/2012 by Dr. Rourk--> Abnormal, patent, tubular esophagus of uncertain significance-status post biopsy (unremarkable). Hiatal hernia. Dilation could not be performed due to small oropharynx and inability to pass dilator. She was last seen in 04/2012. At that time denies further swallowing issues. C/o few weeks of watery stools. No melena, brbpr. GI Pathogen Panel was negative.  Morning and nighttime still with heartburn. Even adding Zantac BID did not help. Could not afford Dexilant but it did seem to help. Has been having nosebleeds and sinusitis. On Penicillin. C/O right sided abdominal pain. Not related to anything. Happens about every two weeks. Brings tears to eyes. Stabbing like pain. Lasts for few minutes. Feels like knot in the area when it happens. No N/V. BM normal. No melena, brbpr.  No swallowing difficulties.  Current Outpatient Prescriptions  Medication Sig Dispense Refill  . acetaminophen (TYLENOL) 500 MG tablet Take 1,000 mg by mouth every 6 (six) hours as needed for pain.      Marland Kitchen albuterol (PROVENTIL HFA;VENTOLIN HFA) 108 (90 BASE) MCG/ACT inhaler Inhale 2 puffs into the lungs every 6 (six) hours as needed. Shortness of breath  6.7 g  3  . aspirin (ASPIRIN EC) 81 MG EC tablet Take 81 mg by mouth daily. Swallow whole.      . benzonatate (TESSALON PERLES) 100 MG capsule Take 1 capsule (100 mg total) by mouth every 6 (six) hours as needed for cough.  21 capsule  0  . gabapentin (NEURONTIN) 300 MG capsule Take 1 capsule (300 mg total) by mouth 3 (three) times daily.  90 capsule  4  . levothyroxine (SYNTHROID, LEVOTHROID) 75 MCG tablet TAKE 1 TABLET BY MOUTH  DAILY  30 tablet  3  . pantoprazole (PROTONIX) 40 MG tablet Take 40 mg by mouth 2 (two) times daily.      . penicillin v potassium (VEETID) 500 MG tablet Take 1 tablet (500 mg total) by mouth 3 (three) times daily.  30 tablet  0  . pravastatin (PRAVACHOL) 40 MG tablet TAKE 1 TABLET BY MOUTH EVERY DAY  30 tablet  6  . ranitidine (ZANTAC) 150 MG tablet Take 1 tablet (150 mg total) by mouth 2 (two) times daily.  60 tablet  2  . sertraline (ZOLOFT) 100 MG tablet TAKE 1 TABLET BY MOUTH EVERY DAY  30 tablet  1  . temazepam (RESTORIL) 15 MG capsule Take 1 capsule (15 mg total) by mouth at bedtime as needed for sleep.  30 capsule  4  . tiZANidine (ZANAFLEX) 2 MG tablet Take 1 tablet (2 mg total) by mouth at bedtime as needed.  30 tablet  2   No current facility-administered medications for this visit.    Allergies as of 08/10/2012  . (No Known Allergies)   Past Medical History  Diagnosis Date  . GERD (gastroesophageal reflux disease)     Dr Jena Gauss EGD 09/2009->esophagitis, sm HH, antral erosions, atonic esophagus  . Seasonal allergies   . Arthritis   . Scleroderma   . Chronic lung disease     Chronic scarring and volume loss-left lung; characteristics of a chronic infectious process-possible MAI  . Chronic back  pain   . Hypothyroid 1981 approx  . Arteriosclerotic cardiovascular disease (ASCVD) 2013    coronary calcification-left main, LAD and CX; small pericardial effusion  . Tobacco abuse 06/24/2009  . Syncope     Multiple spells over the past 40+ years, likely neurocardiogenic  . Hyperlipidemia   . Depression   . Anxiety   . Chronic bronchitis    Past Surgical History  Procedure Laterality Date  . Tubal ligation    . Bilateral salpingoophorectomy  2013     Dr. Despina Hidden; uterus remains in situ  . Laparoscopic appendectomy  07/16/2011    Procedure: APPENDECTOMY LAPAROSCOPIC;  Surgeon: Fabio Bering, MD;  Location: [facility];  Service: General;  Laterality: N/A;  . Colonoscopy  09/28/09     anal papilla otherwise normal  . Esophagogastroduodenoscopy  10/07/09    Dr. Harless Nakayama of esophageal mucosa, diffusely ?esophagitis (bx benign), small HH/antral erosions, erythema bx benign. 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 Esophagogastroduodenoscopy (egd) with esophageal dilation N/A 03/07/2012    RUE:[id number], patent, tubular esophagus of uncertain significance-status post biopsy )unremarkable). Hiatal hernia   Family History  Problem Relation Age of Onset  . Throat cancer Brother   . Stroke Father   . Coronary artery disease Brother   . Down syndrome Brother   . Hypertension Brother     father/mother  . Anesthesia problems Neg Hx   . Hypotension Neg Hx   . Malignant hyperthermia Neg Hx   . Pseudochol deficiency Neg Hx    History   Social History  . Marital Status: Married    Spouse Name: N/A    Number of Children: 2  . Years of Education: N/A   Occupational History  . disabled    Social History Main Topics  . Smoking status: Current Every Day Smoker -- 0.50 packs/day for 40 years    Types: Cigarettes  . Smokeless tobacco: None     Comment: States she is down to 2 cigarettes a day  . Alcohol Use: No     Comment: quit June 2012-used to drink 6 pack daily  . Drug Use: No  . Sexually Active: Yes    Birth Control/ Protection: Post-menopausal   Other Topics Concern  . None   Social History Narrative  . None     ROS:  General: Negative for anorexia, weight loss, fever, chills, fatigue, weakness. ENT: Negative for hoarseness, difficulty swallowing , nasal congestion. CV: Negative for chest pain, angina, palpitations, dyspnea on exertion, peripheral edema.  Respiratory: Negative for dyspnea at rest, dyspnea on exertion, cough, sputum, wheezing.  GI: See history of present illness. GU:  Negative for dysuria, hematuria, urinary incontinence, urinary frequency, nocturnal urination.  Endo: Negative for unusual weight change.    Physical  Examination:   BP 120/75  Pulse 85  Temp(Src) 98.4 F (36.9 C) (Oral)  Ht 5\' 8"  (1.727 m)  Wt 166 lb 3.2 oz (75.388 kg)  BMI 25.28 kg/m2  General: Well-nourished, well-developed in no acute distress.  Eyes: No icterus. Mouth: Oropharyngeal mucosa moist and pink , no lesions erythema or exudate. Lungs: Clear to auscultation bilaterally.  Heart: Regular rate and rhythm, no murmurs rubs or gallops.  Abdomen: Bowel sounds are normal, mild right sided tenderness, nondistended, no hepatosplenomegaly or masses, no abdominal bruits or hernia , no rebound or guarding.   Extremities: No lower extremity edema. No clubbing or deformities. Neuro: Alert and oriented x 4   Skin: Warm and dry, no jaundice.   Psych: Alert and  cooperative, normal mood and affect.  Labs:  Lab Results  Component Value Date   IRON 60 04/25/2012   TIBC 377 04/25/2012   FERRITIN 35 04/25/2012   Lab Results  Component Value Date   WBC 8.3 02/13/2012   HGB 12.9 02/13/2012   HCT 39.4 02/13/2012   MCV 75.3* 02/13/2012   PLT 277 02/13/2012    Imaging Studies: No results found.

## 2012-08-10 NOTE — Progress Notes (Signed)
CC PCP 

## 2012-08-10 NOTE — Assessment & Plan Note (Signed)
57 y/o female with microcytosis, lower end of normal iron/ferritin, low fe sat. Last colonoscopy 2011. Dr. Jena Gauss recommended updated colonoscopy based on these findings. She c/o right sided abdominal pain unrelated to meals or movement, unaffected by BMs. No obvious findings on examination. Last CT one year ago when she presented with acute appendicitis. Colonoscopy in near future.  I have discussed the risks, alternatives, benefits with regards to but not limited to the risk of reaction to medication, bleeding, infection, perforation and the patient is agreeable to proceed. Written consent to be obtained.  If nothing to explain pain on TCS, consider further imaging.  For refractory GERD, try lansoprazole 30mg  daily once she completes pantoprazole RX.

## 2012-08-16 ENCOUNTER — Encounter (HOSPITAL_COMMUNITY): Payer: Self-pay | Admitting: Pharmacy Technician

## 2012-08-22 ENCOUNTER — Other Ambulatory Visit: Payer: Self-pay | Admitting: *Deleted

## 2012-08-22 MED ORDER — PRAVASTATIN SODIUM 40 MG PO TABS: ORAL_TABLET | ORAL | Status: DC

## 2012-08-29 ENCOUNTER — Encounter (HOSPITAL_COMMUNITY): Payer: Self-pay | Admitting: *Deleted

## 2012-08-29 ENCOUNTER — Encounter (HOSPITAL_COMMUNITY)
Admission: RE | Disposition: A | Discharge status: Home / Self Care | Payer: Self-pay | Source: Ambulatory Visit | Attending: Internal Medicine

## 2012-08-29 ENCOUNTER — Ambulatory Visit (HOSPITAL_COMMUNITY)
Admission: RE | Admit: 2012-08-29 | Discharge: 2012-08-29 | Disposition: A | Discharge status: Home / Self Care | Payer: Medicare Other | Source: Ambulatory Visit | Attending: Internal Medicine | Admitting: Internal Medicine

## 2012-08-29 DIAGNOSIS — R109 Unspecified abdominal pain: Secondary | ICD-10-CM

## 2012-08-29 DIAGNOSIS — K921 Melena: Secondary | ICD-10-CM | POA: Insufficient documentation

## 2012-08-29 DIAGNOSIS — R195 Other fecal abnormalities: Secondary | ICD-10-CM

## 2012-08-29 DIAGNOSIS — E785 Hyperlipidemia, unspecified: Secondary | ICD-10-CM | POA: Insufficient documentation

## 2012-08-29 DIAGNOSIS — D509 Iron deficiency anemia, unspecified: Secondary | ICD-10-CM

## 2012-08-29 DIAGNOSIS — D126 Benign neoplasm of colon, unspecified: Secondary | ICD-10-CM | POA: Insufficient documentation

## 2012-08-29 HISTORY — PX: COLONOSCOPY: SHX5424

## 2012-08-29 SURGERY — COLONOSCOPY
Anesthesia: Moderate Sedation

## 2012-08-29 MED ORDER — MEPERIDINE HCL 100 MG/ML IJ SOLN
INTRAMUSCULAR | Status: DC | PRN
  Administered 2012-08-29: 50 mg via INTRAVENOUS
  Administered 2012-08-29: 25 mg via INTRAVENOUS
  Administered 2012-08-29: 50 mg via INTRAVENOUS

## 2012-08-29 MED ORDER — ONDANSETRON HCL 4 MG/2ML IJ SOLN
INTRAMUSCULAR | Status: AC
  Filled 2012-08-29: qty 2

## 2012-08-29 MED ORDER — ONDANSETRON HCL 4 MG/2ML IJ SOLN
INTRAMUSCULAR | Status: DC | PRN
  Administered 2012-08-29: 4 mg via INTRAVENOUS

## 2012-08-29 MED ORDER — MIDAZOLAM HCL 5 MG/5ML IJ SOLN
INTRAMUSCULAR | Status: AC
  Filled 2012-08-29: qty 10

## 2012-08-29 MED ORDER — SODIUM CHLORIDE 0.9 % IV SOLN
INTRAVENOUS | Status: DC
  Administered 2012-08-29: 09:00:00 via INTRAVENOUS

## 2012-08-29 MED ORDER — MIDAZOLAM HCL 5 MG/5ML IJ SOLN
INTRAMUSCULAR | Status: DC | PRN
  Administered 2012-08-29 (×2): 2 mg via INTRAVENOUS
  Administered 2012-08-29 (×2): 1 mg via INTRAVENOUS

## 2012-08-29 MED ORDER — STERILE WATER FOR IRRIGATION IR SOLN
Status: DC | PRN
  Administered 2012-08-29: 09:00:00

## 2012-08-29 MED ORDER — MEPERIDINE HCL 100 MG/ML IJ SOLN
INTRAMUSCULAR | Status: AC
  Filled 2012-08-29: qty 2

## 2012-08-29 NOTE — H&P (View-Only) (Signed)
Primary Care Physician: Margaret Simpson, MD  Primary Gastroenterologist:  Michael Rourk, MD   Chief Complaint  Patient presents with  . Follow-up  . Colonoscopy    HPI: Kelly Douglas is a 57 y.o. female here to set up colonoscopy for low iron and heme positive stool. Her last TCS was in 2011. Unremarkable at that time. EGD 02/2012 by Dr. Rourk--> Abnormal, patent, tubular esophagus of uncertain significance-status post biopsy (unremarkable). Hiatal hernia. Dilation could not be performed due to small oropharynx and inability to pass dilator. She was last seen in 04/2012. At that time denies further swallowing issues. C/o few weeks of watery stools. No melena, brbpr. GI Pathogen Panel was negative.  Morning and nighttime still with heartburn. Even adding Zantac BID did not help. Could not afford Dexilant but it did seem to help. Has been having nosebleeds and sinusitis. On Penicillin. C/O right sided abdominal pain. Not related to anything. Happens about every two weeks. Brings tears to eyes. Stabbing like pain. Lasts for few minutes. Feels like knot in the area when it happens. No N/V. BM normal. No melena, brbpr.  No swallowing difficulties.  Current Outpatient Prescriptions  Medication Sig Dispense Refill  . acetaminophen (TYLENOL) 500 MG tablet Take 1,000 mg by mouth every 6 (six) hours as needed for pain.      . albuterol (PROVENTIL HFA;VENTOLIN HFA) 108 (90 BASE) MCG/ACT inhaler Inhale 2 puffs into the lungs every 6 (six) hours as needed. Shortness of breath  6.7 g  3  . aspirin (ASPIRIN EC) 81 MG EC tablet Take 81 mg by mouth daily. Swallow whole.      . benzonatate (TESSALON PERLES) 100 MG capsule Take 1 capsule (100 mg total) by mouth every 6 (six) hours as needed for cough.  21 capsule  0  . gabapentin (NEURONTIN) 300 MG capsule Take 1 capsule (300 mg total) by mouth 3 (three) times daily.  90 capsule  4  . levothyroxine (SYNTHROID, LEVOTHROID) 75 MCG tablet TAKE 1 TABLET BY MOUTH  DAILY  30 tablet  3  . pantoprazole (PROTONIX) 40 MG tablet Take 40 mg by mouth 2 (two) times daily.      . penicillin v potassium (VEETID) 500 MG tablet Take 1 tablet (500 mg total) by mouth 3 (three) times daily.  30 tablet  0  . pravastatin (PRAVACHOL) 40 MG tablet TAKE 1 TABLET BY MOUTH EVERY DAY  30 tablet  6  . ranitidine (ZANTAC) 150 MG tablet Take 1 tablet (150 mg total) by mouth 2 (two) times daily.  60 tablet  2  . sertraline (ZOLOFT) 100 MG tablet TAKE 1 TABLET BY MOUTH EVERY DAY  30 tablet  1  . temazepam (RESTORIL) 15 MG capsule Take 1 capsule (15 mg total) by mouth at bedtime as needed for sleep.  30 capsule  4  . tiZANidine (ZANAFLEX) 2 MG tablet Take 1 tablet (2 mg total) by mouth at bedtime as needed.  30 tablet  2   No current facility-administered medications for this visit.    Allergies as of 08/10/2012  . (No Known Allergies)   Past Medical History  Diagnosis Date  . GERD (gastroesophageal reflux disease)     Dr Rourk EGD 09/2009->esophagitis, sm HH, antral erosions, atonic esophagus  . Seasonal allergies   . Arthritis   . Scleroderma   . Chronic lung disease     Chronic scarring and volume loss-left lung; characteristics of a chronic infectious process-possible MAI  . Chronic back   pain   . Hypothyroid 1981 approx  . Arteriosclerotic cardiovascular disease (ASCVD) 2013    coronary calcification-left main, LAD and CX; small pericardial effusion  . Tobacco abuse 06/24/2009  . Syncope     Multiple spells over the past 40+ years, likely neurocardiogenic  . Hyperlipidemia   . Depression   . Anxiety   . Chronic bronchitis    Past Surgical History  Procedure Laterality Date  . Tubal ligation    . Bilateral salpingoophorectomy  2013     Dr. Eure; uterus remains in situ  . Laparoscopic appendectomy  07/16/2011    Procedure: APPENDECTOMY LAPAROSCOPIC;  Surgeon: Brent C Ziegler, MD;  Location: [facility];  Service: General;  Laterality: N/A;  . Colonoscopy  09/28/09     anal papilla otherwise normal  . Esophagogastroduodenoscopy  10/07/09    Dr. Rourk-->friability of esophageal mucosa, diffusely ?esophagitis (bx benign), small HH/antral erosions, erythema bx benign. 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 Esophagogastroduodenoscopy (egd) with esophageal dilation N/A 03/07/2012    RMR:Abnormal, patent, tubular esophagus of uncertain significance-status post biopsy )unremarkable). Hiatal hernia   Family History  Problem Relation Age of Onset  . Throat cancer Brother   . Stroke Father   . Coronary artery disease Brother   . Down syndrome Brother   . Hypertension Brother     father/mother  . Anesthesia problems Neg Hx   . Hypotension Neg Hx   . Malignant hyperthermia Neg Hx   . Pseudochol deficiency Neg Hx    History   Social History  . Marital Status: Married    Spouse Name: N/A    Number of Children: 2  . Years of Education: N/A   Occupational History  . disabled    Social History Main Topics  . Smoking status: Current Every Day Smoker -- 0.50 packs/day for 40 years    Types: Cigarettes  . Smokeless tobacco: None     Comment: States she is down to 2 cigarettes a day  . Alcohol Use: No     Comment: quit June 2012-used to drink 6 pack daily  . Drug Use: No  . Sexually Active: Yes    Birth Control/ Protection: Post-menopausal   Other Topics Concern  . None   Social History Narrative  . None     ROS:  General: Negative for anorexia, weight loss, fever, chills, fatigue, weakness. ENT: Negative for hoarseness, difficulty swallowing , nasal congestion. CV: Negative for chest pain, angina, palpitations, dyspnea on exertion, peripheral edema.  Respiratory: Negative for dyspnea at rest, dyspnea on exertion, cough, sputum, wheezing.  GI: See history of present illness. GU:  Negative for dysuria, hematuria, urinary incontinence, urinary frequency, nocturnal urination.  Endo: Negative for unusual weight change.    Physical  Examination:   BP 120/75  Pulse 85  Temp(Src) 98.4 F (36.9 C) (Oral)  Ht 5' 8" (1.727 m)  Wt 166 lb 3.2 oz (75.388 kg)  BMI 25.28 kg/m2  General: Well-nourished, well-developed in no acute distress.  Eyes: No icterus. Mouth: Oropharyngeal mucosa moist and pink , no lesions erythema or exudate. Lungs: Clear to auscultation bilaterally.  Heart: Regular rate and rhythm, no murmurs rubs or gallops.  Abdomen: Bowel sounds are normal, mild right sided tenderness, nondistended, no hepatosplenomegaly or masses, no abdominal bruits or hernia , no rebound or guarding.   Extremities: No lower extremity edema. No clubbing or deformities. Neuro: Alert and oriented x 4   Skin: Warm and dry, no jaundice.   Psych: Alert and   cooperative, normal mood and affect.  Labs:  Lab Results  Component Value Date   IRON 60 04/25/2012   TIBC 377 04/25/2012   FERRITIN 35 04/25/2012   Lab Results  Component Value Date   WBC 8.3 02/13/2012   HGB 12.9 02/13/2012   HCT 39.4 02/13/2012   MCV 75.3* 02/13/2012   PLT 277 02/13/2012    Imaging Studies: No results found.      

## 2012-08-29 NOTE — Interval H&P Note (Signed)
History and Physical Interval Note:  08/29/2012 9:18 AM  Kelly Douglas  has presented today for surgery, with the diagnosis of IDA  AND HEME POSITIVE STOOL RIGHT SIDE ABDOMINAL PAIN  The various methods of treatment have been discussed with the patient and family. After consideration of risks, benefits and other options for treatment, the patient has consented to  Procedure(s) with comments: COLONOSCOPY (N/A) - 9:15 as a surgical intervention .  The patient's history has been reviewed, patient examined, no change in status, stable for surgery.  I have reviewed the patient's chart and labs.  Questions were answered to the patient's satisfaction.       Patient reports significant epistaxis intermittently to. Had swallowed some blood. Colonoscopy today per plan.The risks, benefits, limitations, alternatives and imponderables have been reviewed with the patient. Potential for esophageal dilation, biopsy, etc. have also been reviewed.  Questions have been answered. All parties agreeable.   Eula Listen

## 2012-08-29 NOTE — Op Note (Signed)
[facility] [address] [place], [address]   COLONOSCOPY PROCEDURE REPORT  PATIENT: Kelly Douglas, Kelly Douglas  MR#:         [id number] BIRTHDATE: 1955-08-27 , 57  yrs. old GENDER: Female ENDOSCOPIST: R.  Roetta Sessions, MD FACP FACG REFERRED BY:  Syliva Overman, M.D.  Newman Pies, M.D. PROCEDURE DATE:  08/29/2012 PROCEDURE:     ileocolonoscopy with snare polypectomy  INDICATIONS: Hemoccult-positive stools; iron deficiency anemia; recurrent epistaxis  INFORMED CONSENT:  The risks, benefits, alternatives and imponderables including but not limited to bleeding, perforation as well as the possibility of a missed lesion have been reviewed.  The potential for biopsy, lesion removal, etc. have also been discussed.  Questions have been answered.  All parties agreeable. Please see the history and physical in the medical record for more information.  MEDICATIONS: Versed 6 mg IV and Demerol 125 mg IV in divided doses. Zofran 4 mg IV  DESCRIPTION OF PROCEDURE:  After a digital rectal exam was performed, the EC-[id number] ([id number])  colonoscope was advanced from the anus through the rectum and colon to the area of the cecum, ileocecal valve and appendiceal orifice.  The cecum was deeply intubated.  These structures were well-seen and photographed for the record.  From the level of the cecum and ileocecal valve, the scope was slowly and cautiously withdrawn.  The mucosal surfaces were carefully surveyed utilizing scope tip deflection to facilitate fold flattening as needed.  The scope was pulled down into the rectum where a thorough examination including retroflexion was performed.    FINDINGS:  Adequate preparation. Single renal canal hemorrhoidal tag; otherwise, normal rectal mucosa (1) 4 mm polyp in the mid descending segment; otherwise, the remainder of colonic mucosa appeared normal. Normal distal 5 cm of terminal ileal mucosa.  THERAPEUTIC / DIAGNOSTIC MANEUVERS PERFORMED:   The above-mentioned polyp was cold snare removed  COMPLICATIONS: None  CECAL WITHDRAWAL TIME:  11 minutes  IMPRESSION:  Colonic polyp-removed as described above. No significant lesion found on today's exam to account for Hemoccult positive stool or iron deficiency anemia. Patient reports recurrent epistaxis and frequently ends up swallowing blood. This could easily explain a Hemoccult-positive stool and could be a major contributing factor to the picture of iron deficiency.   RECOMMENDATIONS: Followup on pathology. Patient urged to see Dr. Christain Sacramento, whom she has seen previously for epistaxis.   _______________________________ eSigned:  R. Roetta Sessions, MD FACP [facility] 08/29/2012 10:02 AM   CC:    PATIENT NAME:  Kelly Douglas, Kelly Douglas MR#: [id number]

## 2012-08-30 ENCOUNTER — Encounter (HOSPITAL_COMMUNITY): Payer: Self-pay | Admitting: Dietician

## 2012-08-30 NOTE — Progress Notes (Signed)
[facility] Diabetes Class Completion  Date:August 30, 2012  Time: 1730  Pt attended [facility]'s Diabetes [facility] Education Class on August 30, 2012.   Patient was educated on the following topics:   -Survival skills (signs and symptoms of hyperglycemia and hypoglycemia, treatment for hypoglycemia, ideal levels for fasting and postprandial blood sugars, goal Hgb A1c level, foot care basics)  -Recommendations for physical activity   -Carbohydrate metabolism in relation to diabetes   -Meal planning (sources of carbohydrate, carbohydrate counting, meal planning strategies, food label reading, and portion control).  Handouts provided:  -"Diabetes and You: Taking Charge of Your Health"  -"Carbohydrate Counting and Meal Planning"  -"Blood Sugar Diary"  -"Your Guide to Better Office Visits"   Aleira Deiter A. Caesar Mannella, RD, LDN   

## 2012-08-31 ENCOUNTER — Encounter: Payer: Self-pay | Admitting: Internal Medicine

## 2012-08-31 ENCOUNTER — Telehealth: Payer: Self-pay | Admitting: Family Medicine

## 2012-08-31 ENCOUNTER — Encounter (HOSPITAL_COMMUNITY): Payer: Self-pay | Admitting: Internal Medicine

## 2012-08-31 DIAGNOSIS — E039 Hypothyroidism, unspecified: Secondary | ICD-10-CM

## 2012-08-31 DIAGNOSIS — R7302 Impaired glucose tolerance (oral): Secondary | ICD-10-CM

## 2012-08-31 NOTE — Addendum Note (Signed)
Addended by: Kandis Fantasia B on: 08/31/2012 11:13 AM   Modules accepted: Orders

## 2012-08-31 NOTE — Telephone Encounter (Signed)
Patient aware and states that she will have drawn by 8/18

## 2012-08-31 NOTE — Telephone Encounter (Signed)
pls contact pt, she needs non fasting HBA1C and TSH pls, has dx of IGT and is on thyroid med. Recently seen for diabetic ed so I need an update as to status, no hBa1C x over 1 year thanks

## 2012-09-01 ENCOUNTER — Encounter: Payer: Self-pay | Admitting: Internal Medicine

## 2012-09-05 ENCOUNTER — Telehealth: Payer: Self-pay

## 2012-09-05 NOTE — Telephone Encounter (Signed)
Pt called- she was finishing the protonix she had before she picked up the lansoprazole rx. When she went to get it, the cost was $45. She wont have any money until the end of the month and is requesting samples. Informed her that we dont have any prevacid samples and that the pt could get it otc. She said she didn't have any money for that either. Per LSL ok to give samples of dexilant. Samples at the front desk and pt is aware.

## 2012-09-10 NOTE — Telephone Encounter (Signed)
Noted. Do we know how much Dexilant would cost with a rebate for her?

## 2012-09-11 NOTE — Telephone Encounter (Signed)
Pt has medicare. She cannot use a rebate card.

## 2012-09-11 NOTE — Telephone Encounter (Signed)
noted 

## 2012-11-01 ENCOUNTER — Encounter (INDEPENDENT_AMBULATORY_CARE_PROVIDER_SITE_OTHER): Payer: Self-pay

## 2012-11-01 ENCOUNTER — Encounter: Payer: Self-pay | Admitting: Family Medicine

## 2012-11-01 ENCOUNTER — Ambulatory Visit (INDEPENDENT_AMBULATORY_CARE_PROVIDER_SITE_OTHER): Payer: Medicare Other | Admitting: Family Medicine

## 2012-11-01 VITALS — BP 120/82 | HR 101 | Resp 16 | Ht 69.0 in | Wt 156.0 lb

## 2012-11-01 DIAGNOSIS — F341 Dysthymic disorder: Secondary | ICD-10-CM

## 2012-11-01 DIAGNOSIS — E559 Vitamin D deficiency, unspecified: Secondary | ICD-10-CM

## 2012-11-01 DIAGNOSIS — G4700 Insomnia, unspecified: Secondary | ICD-10-CM

## 2012-11-01 DIAGNOSIS — E039 Hypothyroidism, unspecified: Secondary | ICD-10-CM

## 2012-11-01 DIAGNOSIS — F329 Major depressive disorder, single episode, unspecified: Secondary | ICD-10-CM

## 2012-11-01 DIAGNOSIS — D509 Iron deficiency anemia, unspecified: Secondary | ICD-10-CM

## 2012-11-01 DIAGNOSIS — E785 Hyperlipidemia, unspecified: Secondary | ICD-10-CM

## 2012-11-01 DIAGNOSIS — Z23 Encounter for immunization: Secondary | ICD-10-CM

## 2012-11-01 DIAGNOSIS — I10 Essential (primary) hypertension: Secondary | ICD-10-CM

## 2012-11-01 DIAGNOSIS — F17200 Nicotine dependence, unspecified, uncomplicated: Secondary | ICD-10-CM

## 2012-11-01 DIAGNOSIS — K219 Gastro-esophageal reflux disease without esophagitis: Secondary | ICD-10-CM

## 2012-11-01 DIAGNOSIS — Z720 Tobacco use: Secondary | ICD-10-CM

## 2012-11-01 MED ORDER — PRAVASTATIN SODIUM 40 MG PO TABS: ORAL_TABLET | ORAL | Status: DC

## 2012-11-01 MED ORDER — TIZANIDINE HCL 2 MG PO TABS: 2.0000 mg | ORAL_TABLET | Freq: Every evening | ORAL | Status: DC | PRN

## 2012-11-01 MED ORDER — PANTOPRAZOLE SODIUM 40 MG PO TBEC: 40.0000 mg | DELAYED_RELEASE_TABLET | Freq: Every day | ORAL | Status: DC

## 2012-11-01 MED ORDER — MIRTAZAPINE 30 MG PO TABS: 15.0000 mg | ORAL_TABLET | Freq: Every day | ORAL | Status: DC

## 2012-11-01 NOTE — Progress Notes (Signed)
  Subjective:    Patient ID: Kelly Douglas, female    DOB: 07-Feb-1955, 57 y.o.   MRN: [id number]  HPI The PT is here for follow up and re-evaluation of chronic medical conditions, medication management and review of any available recent lab and radiology data.  Preventive health is updated, specifically  Cancer screening and Immunization.  Reflux is not controlled with prevacid, symptomatic for reflux all the time Up to  Half pack per day Unable to sleep due to anxiety, c/o mild depression, always feels overwhelmed, not suicidal or hoimicidal C/o neck and back spasm        Review of Systems See HPI Denies recent fever or chills. Denies sinus pressure, nasal congestion, ear pain or sore throat.  Denies chest pains, palpitations and leg swelling Denies abdominal pain, nausea, vomiting,diarrhea or constipation.   Denies dysuria, frequency, hesitancy or incontinence. Denies joint pain, swelling and limitation in mobility. Denies headaches, seizures, numbness, or tingling.  Denies skin break down or rash.        Objective:   Physical Exam  Patient alert and oriented and in no cardiopulmonary distress.  HEENT: No facial asymmetry, EOMI, no sinus tenderness,  oropharynx pink and moist.  Neck decreased ROM with spasm no adenopathy.  Chest: Clear to auscultation bilaterally.decreased air entry throughout  CVS: S1, S2 no murmurs, no S3.  ABD: Soft non tender. Bowel sounds normal.  Ext: No edema  MS: Adequate though reduced  ROM spine, shoulders, hips and knees.  Skin: Intact, no ulcerations or rash noted.  Psych: Good eye contact, normal affect. Memory intact  anxious  And mildly depressed appearing.  CNS: CN 2-12 intact, power, tone and sensation normal throughout.       Assessment & Plan:

## 2012-11-01 NOTE — Patient Instructions (Addendum)
F/u first week in December, call if you need me before  New for sleep is remeron 30mg  one at bedtime, stop restoril   New for reflux since prevacid is not working is protonix 40mg  one daily   Please cut back on smoking, espescialy at night when trying to sleep   Flu vaccine today  Fasting lipid, cmp, TSH, cbc  Stop iron today

## 2012-11-03 NOTE — Assessment & Plan Note (Signed)
Reports prevacid ineffective, symptomatic on it, reports symptom control on protonix , which hse has used in the past, script sent in for this Reminded her to also reduce nicotine and caffeine for better control

## 2012-11-03 NOTE — Assessment & Plan Note (Signed)
Controlled, no change in medication  

## 2012-11-03 NOTE — Assessment & Plan Note (Signed)
Uncontrolled, though not suicidal or homicidal, start remeron

## 2012-11-03 NOTE — Assessment & Plan Note (Signed)
Reports worsened, smoking more cigarettes esp at night , states her nerves are bad Patient counseled for approximately 5 minutes regarding the health risks of ongoing nicotine use, specifically all types of cancer, heart disease, stroke and respiratory failure. The options available for help with cessation ,the behavioral changes to assist the process, and the option to either gradully reduce usage  Or abruptly stop.is also discussed. Pt is also encouraged to set specific goals in number of cigarettes used daily, as well as to set a quit date.

## 2012-11-03 NOTE — Assessment & Plan Note (Signed)
Resolved pt advised to d/c iron supplement

## 2012-11-03 NOTE — Assessment & Plan Note (Signed)
Uncontrolled, no response to restoril. Sleep hygiene reviewed, start remeron, which will also help with depression and poor apetite

## 2012-12-03 ENCOUNTER — Other Ambulatory Visit: Payer: Self-pay | Admitting: Family Medicine

## 2012-12-03 LAB — CBC WITH DIFFERENTIAL/PLATELET
Basophils Relative: 0 % (ref 0–1)
Eosinophils Relative: 2 % (ref 0–5)
HCT: 35.7 % — ABNORMAL LOW (ref 36.0–46.0)
Hemoglobin: 11.6 g/dL — ABNORMAL LOW (ref 12.0–15.0)
MCH: 24.1 pg — ABNORMAL LOW (ref 26.0–34.0)
MCHC: 32.5 g/dL (ref 30.0–36.0)
MCV: 74.2 fL — ABNORMAL LOW (ref 78.0–100.0)
Monocytes Absolute: 0.8 10*3/uL (ref 0.1–1.0)
Monocytes Relative: 10 % (ref 3–12)
Neutro Abs: 3.5 10*3/uL (ref 1.7–7.7)

## 2012-12-04 LAB — COMPREHENSIVE METABOLIC PANEL
ALT: 9 U/L (ref 0–35)
AST: 15 U/L (ref 0–37)
Alkaline Phosphatase: 110 U/L (ref 39–117)
Creat: 0.82 mg/dL (ref 0.50–1.10)
Sodium: 142 mEq/L (ref 135–145)
Total Bilirubin: 0.3 mg/dL (ref 0.3–1.2)
Total Protein: 6.9 g/dL (ref 6.0–8.3)

## 2012-12-04 LAB — LIPID PANEL
HDL: 30 mg/dL — ABNORMAL LOW (ref >39)
LDL Cholesterol: 77 mg/dL (ref 0–99)
Total CHOL/HDL Ratio: 4.7 Ratio
Triglycerides: 176 mg/dL — ABNORMAL HIGH (ref <150)
VLDL: 35 mg/dL (ref 0–40)

## 2012-12-04 LAB — TSH: TSH: 1.101 u[IU]/mL (ref 0.350–4.500)

## 2012-12-04 LAB — FERRITIN: Ferritin: 13 ng/mL (ref 10–291)

## 2012-12-18 ENCOUNTER — Ambulatory Visit: Payer: Medicare Other | Admitting: Family Medicine

## 2012-12-20 ENCOUNTER — Telehealth: Payer: Self-pay | Admitting: Internal Medicine

## 2012-12-20 NOTE — Telephone Encounter (Signed)
Pt called to see if we could call something in to [facility] for diarrhea. [phone]

## 2012-12-20 NOTE — Telephone Encounter (Signed)
Needs OV to discuss. Has not been seen in over four months.

## 2012-12-20 NOTE — Telephone Encounter (Signed)
Susan, please schedule pt ov. 

## 2012-12-20 NOTE — Telephone Encounter (Signed)
Spoke with pt- she is going for 3 weeks with no diarrhea and then will have watery diarrhea for 3-4 days. No pain, no N/V, no blood in her stool. Pt even had Dr. Lodema Hong change her back to protonix to see if the prevacid was causing the problems and it didn't make any difference. Pt is c/o heartburn as well. Pt wants to know if you can send something in to [facility].

## 2012-12-25 ENCOUNTER — Encounter (INDEPENDENT_AMBULATORY_CARE_PROVIDER_SITE_OTHER): Payer: Self-pay

## 2012-12-25 ENCOUNTER — Encounter: Payer: Self-pay | Admitting: Family Medicine

## 2012-12-25 ENCOUNTER — Ambulatory Visit (INDEPENDENT_AMBULATORY_CARE_PROVIDER_SITE_OTHER): Payer: Medicare Other | Admitting: Family Medicine

## 2012-12-25 VITALS — BP 170/100 | HR 98 | Resp 18 | Ht 69.0 in | Wt 171.0 lb

## 2012-12-25 DIAGNOSIS — F329 Major depressive disorder, single episode, unspecified: Secondary | ICD-10-CM

## 2012-12-25 DIAGNOSIS — R7309 Other abnormal glucose: Secondary | ICD-10-CM

## 2012-12-25 DIAGNOSIS — I10 Essential (primary) hypertension: Secondary | ICD-10-CM

## 2012-12-25 DIAGNOSIS — G4700 Insomnia, unspecified: Secondary | ICD-10-CM

## 2012-12-25 DIAGNOSIS — R7302 Impaired glucose tolerance (oral): Secondary | ICD-10-CM

## 2012-12-25 DIAGNOSIS — E039 Hypothyroidism, unspecified: Secondary | ICD-10-CM

## 2012-12-25 DIAGNOSIS — E785 Hyperlipidemia, unspecified: Secondary | ICD-10-CM

## 2012-12-25 DIAGNOSIS — F341 Dysthymic disorder: Secondary | ICD-10-CM

## 2012-12-25 DIAGNOSIS — F17200 Nicotine dependence, unspecified, uncomplicated: Secondary | ICD-10-CM

## 2012-12-25 DIAGNOSIS — Z720 Tobacco use: Secondary | ICD-10-CM

## 2012-12-25 MED ORDER — AMLODIPINE BESYLATE 5 MG PO TABS: 5.0000 mg | ORAL_TABLET | Freq: Every day | ORAL | Status: DC

## 2012-12-25 MED ORDER — LORAZEPAM 0.5 MG PO TABS: 0.5000 mg | ORAL_TABLET | Freq: Every day | ORAL | Status: DC

## 2012-12-25 NOTE — Patient Instructions (Addendum)
Pelvic and complete exam in March, call if you need me before  New for blood pressure which is HIGH is amlodipine 5 mg every day, you NEED this  New to help with sleep is ativan one at bedtime, continue medication remeron for n"nerves " and depression  No need to gain anymore weight you are at  Healthy weight  Work on stopping smoking please  Fasting lipid, cmp, HBA1C and TSH in march before visit

## 2012-12-25 NOTE — Progress Notes (Signed)
   Subjective:    Patient ID: Kelly Douglas, female    DOB: 26-Jun-1955, 57 y.o.   MRN: [id number]  HPI  The PT is here for follow up and re-evaluation of chronic medical conditions, medication management and review of any available recent lab and radiology data.  Preventive health is updated, specifically  Cancer screening and Immunization.   Questions or concerns regarding consultations or procedures which the PT has had in the interim are  addressed. The PT denies any adverse reactions to current medications since the last visit.  There are no new concerns. Sleep slightly improved but still insufficient  There are no specific complaints      Review of Systems See HPI Denies recent fever or chills. Denies sinus pressure, nasal congestion, ear pain or sore throat. Denies chest congestion, productive cough or wheezing. Denies chest pains, palpitations and leg swelling Denies abdominal pain, nausea, vomiting,diarrhea or constipation.   Denies dysuria, frequency, hesitancy or incontinence. Denies joint pain, swelling and limitation in mobility. Denies headaches, seizures, numbness, or tingling.  Denies skin break down or rash.        Objective:   Physical Exam  Patient alert and oriented and in no cardiopulmonary distress.  HEENT: No facial asymmetry, EOMI, no sinus tenderness,  oropharynx pink and moist.  Neck supple no adenopathy.  Chest: Clear to auscultation bilaterally.Decreased air entry throughout  CVS: S1, S2 no murmurs, no S3.  ABD: Soft non tender. Bowel sounds normal.  Ext: No edema  MS: Adequate ROM spine, shoulders, hips and knees.  Skin: Intact, no ulcerations or rash noted.  Psych: Good eye contact, normal affect. Memory intact anxious not  depressed appearing.  CNS: CN 2-12 intact, power, tone and sensation normal throughout.       Assessment & Plan:

## 2012-12-27 ENCOUNTER — Encounter: Payer: Self-pay | Admitting: Internal Medicine

## 2012-12-27 NOTE — Telephone Encounter (Signed)
Pt is aware of OV on 1/13 at 0830 with [facility] and appt card was mailed

## 2012-12-30 NOTE — Assessment & Plan Note (Signed)
Uncontrolled DASH diet and commitment to daily physical activity for a minimum of 30 minutes discussed and encouraged, as a part of hypertension management. The importance of attaining a healthy weight is also discussed. Start norvasc

## 2012-12-30 NOTE — Assessment & Plan Note (Signed)
Slightly improved but still reports inadequate sleep, add ativan to assist

## 2012-12-30 NOTE — Assessment & Plan Note (Signed)
Controlled, no change in medication  

## 2012-12-30 NOTE — Assessment & Plan Note (Signed)
Unchanged and unwilling to set quit date Patient counseled for approximately 5 minutes regarding the health risks of ongoing nicotine use, specifically all types of cancer, heart disease, stroke and respiratory failure. The options available for help with cessation ,the behavioral changes to assist the process, and the option to either gradully reduce usage  Or abruptly stop.is also discussed. Pt is also encouraged to set specific goals in number of cigarettes used daily, as well as to set a quit date.  

## 2012-12-30 NOTE — Assessment & Plan Note (Signed)
Improved , still suggest therapy however no interest currently financially stressed

## 2013-01-29 ENCOUNTER — Encounter: Payer: Self-pay | Admitting: Gastroenterology

## 2013-01-29 ENCOUNTER — Ambulatory Visit (INDEPENDENT_AMBULATORY_CARE_PROVIDER_SITE_OTHER): Payer: Medicare HMO | Admitting: Gastroenterology

## 2013-01-29 ENCOUNTER — Encounter (INDEPENDENT_AMBULATORY_CARE_PROVIDER_SITE_OTHER): Payer: Self-pay

## 2013-01-29 VITALS — BP 116/78 | HR 104 | Temp 98.1°F | Wt 174.0 lb

## 2013-01-29 DIAGNOSIS — R197 Diarrhea, unspecified: Secondary | ICD-10-CM

## 2013-01-29 DIAGNOSIS — K219 Gastro-esophageal reflux disease without esophagitis: Secondary | ICD-10-CM

## 2013-01-29 DIAGNOSIS — R109 Unspecified abdominal pain: Secondary | ICD-10-CM

## 2013-01-29 DIAGNOSIS — D509 Iron deficiency anemia, unspecified: Secondary | ICD-10-CM

## 2013-01-29 MED ORDER — FERROUS SULFATE 325 (65 FE) MG PO TBEC: 325.0000 mg | DELAYED_RELEASE_TABLET | Freq: Two times a day (BID) | ORAL | Status: DC

## 2013-01-29 NOTE — Patient Instructions (Signed)
1. Please have your blood work done at least 2 days prior to her CT scan. 2. CT of the abdomen for further evaluation of pain. 3. Please resume ferrous sulfate 325 mg twice a day (iron). 4. Continue pantoprazole for your acid reflux. You may take up to 2 times daily if needed.

## 2013-01-29 NOTE — Assessment & Plan Note (Signed)
Overall doing well with occasional breakthrough at nighttime. She takes an additional dose of pantoprazole about 2-3 times per week. Denies any dysphagia, vomiting, anorexia.

## 2013-01-29 NOTE — Assessment & Plan Note (Signed)
Chronic intermittent right upper quadrant abdominal pain. More epigastric as well. Denies postprandial component. Frequency of episodes has increased. CT of the abd and pelvis with contrast for further evaluation.

## 2013-01-29 NOTE — Progress Notes (Signed)
cc'd to pcp 

## 2013-01-29 NOTE — Progress Notes (Signed)
Primary Care Physician: Tula Nakayama, MD  Primary Gastroenterologist:  Garfield Cornea, MD   Chief Complaint  Patient presents with  . Follow-up    HPI: Kelly Douglas is a 58 y.o. female here for f/u diarrhea. Last seen at time of TCS 08/2012 for low iron, heme + stool. She had single tubular adenoma. Normal distal TI. It was felt that her iron deficiency and Hemoccult positive stool may be secondary to epistaxis.  Five months of intermittent diarrhea. May go several weeks without any diarrhea and then has several days of loose to watery stools. Tried eliminating tap water/city water because with drinking water it seemed to be worse but this did not help. Thought it was prevacid but no improvement. Currently on pantoprazole, sometimes takes BID. Pain in epigastric area now. Used to be more right flank. Pain more frequent but has been happening for years. Feels like swelling/like something moved out of place. Not related to meals or BMs. First started in 1980s. Last about a minute or two. Sharp/severe when it happens. Holds Bible on stomach when it happens and prays. Severe enough she wants to die when it happens. Last episode about four days ago. Still sore from the episode. No dysphagia. Occasionally takes twice per day, PPI. No weight loss.    Current Outpatient Prescriptions  Medication Sig Dispense Refill  . acetaminophen (TYLENOL) 500 MG tablet Take 1,000 mg by mouth every 6 (six) hours as needed for pain.      Marland Kitchen albuterol (PROVENTIL HFA;VENTOLIN HFA) 108 (90 BASE) MCG/ACT inhaler Inhale 2 puffs into the lungs every 6 (six) hours as needed. Shortness of breath  6.7 g  3  . amLODipine (NORVASC) 5 MG tablet Take 1 tablet (5 mg total) by mouth daily.  30 tablet  3  . aspirin (ASPIRIN EC) 81 MG EC tablet Take 81 mg by mouth daily. Swallow whole.      . ferrous sulfate 325 (65 FE) MG EC tablet Take 325 mg by mouth daily with breakfast.      . gabapentin (NEURONTIN) 300 MG capsule  Take 1 capsule (300 mg total) by mouth 3 (three) times daily.  90 capsule  4  . ibuprofen (ADVIL,MOTRIN) 200 MG tablet Take 200 mg by mouth every 6 (six) hours as needed for pain.      Marland Kitchen levothyroxine (SYNTHROID, LEVOTHROID) 75 MCG tablet TAKE 1 TABLET BY MOUTH DAILY  30 tablet  3  . LORazepam (ATIVAN) 0.5 MG tablet Take 1 tablet (0.5 mg total) by mouth at bedtime.  30 tablet  1  . mirtazapine (REMERON) 30 MG tablet Take 0.5 tablets (15 mg total) by mouth at bedtime.  30 tablet  2  . pantoprazole (PROTONIX) 40 MG tablet Take 1 tablet (40 mg total) by mouth daily.  30 tablet  3  . pravastatin (PRAVACHOL) 40 MG tablet TAKE 1 TABLET BY MOUTH EVERY DAY  30 tablet  3  . tiZANidine (ZANAFLEX) 2 MG tablet Take 1 tablet (2 mg total) by mouth at bedtime as needed.  30 tablet  3   No current facility-administered medications for this visit.    Allergies as of 01/29/2013  . (No Known Allergies)   Past Medical History  Diagnosis Date  . GERD (gastroesophageal reflux disease)     Dr Gala Romney EGD 09/2009->esophagitis, sm HH, antral erosions, atonic esophagus  . Seasonal allergies   . Arthritis   . Scleroderma   . Chronic lung disease  Chronic scarring and volume loss-left lung; characteristics of a chronic infectious process-possible MAI  . Chronic back pain   . Hypothyroid 1981 approx  . Arteriosclerotic cardiovascular disease (ASCVD) 2013    coronary calcification-left main, LAD and CX; small pericardial effusion  . Tobacco abuse 06/24/2009  . Syncope     Multiple spells over the past 40+ years, likely neurocardiogenic  . Hyperlipidemia   . Depression   . Anxiety   . Chronic bronchitis    Past Surgical History  Procedure Laterality Date  . Tubal ligation    . Bilateral salpingoophorectomy  2013     Dr. Elonda Husky; uterus remains in situ  . Laparoscopic appendectomy  07/16/2011    Procedure: APPENDECTOMY LAPAROSCOPIC;  Surgeon: Donato Heinz, MD;  Location: [facility];  Service: General;   Laterality: N/A;  . Colonoscopy  09/28/09    anal papilla otherwise normal  . Esophagogastroduodenoscopy  10/07/09    Dr. Barrie Dunker of esophageal mucosa, diffusely ?esophagitis (bx benign), small HH/antral erosions, erythema bx benign. 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 Esophagogastroduodenoscopy (egd) with esophageal dilation N/A 03/07/2012    OHY:[id number], patent, tubular esophagus of uncertain significance-status post biopsy )unremarkable). Hiatal hernia  . Colonoscopy N/A 08/29/2012    IRS:WNIOEVO polyp-removed as described above. tubular adenoma    ROS:  General: Negative for anorexia, weight loss, fever, chills, fatigue, weakness. ENT: Negative for hoarseness, difficulty swallowing , nasal congestion. Still having nosebleeds. CV: Negative for chest pain, angina, palpitations, dyspnea on exertion, peripheral edema.  Respiratory: Negative for dyspnea at rest, dyspnea on exertion, cough, sputum, wheezing.  GI: See history of present illness. GU:  Negative for dysuria, hematuria, urinary incontinence, urinary frequency, nocturnal urination.  Endo: Negative for unusual weight change.    Physical Examination:   BP 116/78  Pulse 104  Temp(Src) 98.1 F (36.7 C) (Oral)  Wt 174 lb (78.926 kg)  General: Well-nourished, well-developed in no acute distress.  Eyes: No icterus. Mouth: Oropharyngeal mucosa moist and pink , no lesions erythema or exudate. Lungs: Clear to auscultation bilaterally.  Heart: Regular rate and rhythm, no murmurs rubs or gallops.  Abdomen: Bowel sounds are normal, moderate epig/ruq tenderness, nondistended, no hepatosplenomegaly or masses, no abdominal bruits or hernia , no rebound or guarding.   Extremities: No lower extremity edema. No clubbing or deformities. Neuro: Alert and oriented x 4   Skin: Warm and dry, no jaundice.   Psych: Alert and cooperative, normal mood and affect.  Labs:  Lab Results  Component Value Date   TSH 1.101  12/03/2012   Lab Results  Component Value Date   WBC 8.4 12/03/2012   HGB 11.6* 12/03/2012   HCT 35.7* 12/03/2012   MCV 74.2* 12/03/2012   PLT 323 12/03/2012   Lab Results  Component Value Date   IRON 69 12/03/2012   TIBC 377 04/25/2012   FERRITIN 13 12/03/2012    Imaging Studies: No results found.

## 2013-01-29 NOTE — Assessment & Plan Note (Signed)
Followup today. Patient has been off of iron supplements since the middle of October her PCP request.

## 2013-01-29 NOTE — Assessment & Plan Note (Signed)
Six-month history of intermittent diarrhea. Goes weeks at a time with normal bowel function. No blood in the stool. Completed GI pathogen panel several months ago. History of heme positive stool with unremarkable ileocolonoscopy as outlined above. We'll check for celiac disease for completeness in light of iron deficiency. CT of the abdomen and pelvis to further evaluate abdominal pain. Evaluate diarrhea at this time as well.

## 2013-01-31 ENCOUNTER — Other Ambulatory Visit: Payer: Self-pay | Admitting: Gastroenterology

## 2013-01-31 DIAGNOSIS — D509 Iron deficiency anemia, unspecified: Secondary | ICD-10-CM

## 2013-01-31 DIAGNOSIS — R109 Unspecified abdominal pain: Secondary | ICD-10-CM

## 2013-01-31 DIAGNOSIS — R197 Diarrhea, unspecified: Secondary | ICD-10-CM

## 2013-01-31 DIAGNOSIS — K219 Gastro-esophageal reflux disease without esophagitis: Secondary | ICD-10-CM

## 2013-02-01 LAB — CBC WITH DIFFERENTIAL/PLATELET
Basophils Absolute: 0 10*3/uL (ref 0.0–0.1)
Basophils Relative: 0 % (ref 0–1)
EOS ABS: 0.1 10*3/uL (ref 0.0–0.7)
EOS PCT: 1 % (ref 0–5)
HEMATOCRIT: 36.3 % (ref 36.0–46.0)
HEMOGLOBIN: 12 g/dL (ref 12.0–15.0)
LYMPHS ABS: 4.4 10*3/uL — AB (ref 0.7–4.0)
LYMPHS PCT: 50 % — AB (ref 12–46)
MCH: 24.6 pg — AB (ref 26.0–34.0)
MCHC: 33.1 g/dL (ref 30.0–36.0)
MCV: 74.5 fL — AB (ref 78.0–100.0)
MONOS PCT: 9 % (ref 3–12)
Monocytes Absolute: 0.8 10*3/uL (ref 0.1–1.0)
Neutro Abs: 3.5 10*3/uL (ref 1.7–7.7)
Neutrophils Relative %: 40 % — ABNORMAL LOW (ref 43–77)
PLATELETS: 338 10*3/uL (ref 150–400)
RBC: 4.87 MIL/uL (ref 3.87–5.11)
RDW: 16.2 % — ABNORMAL HIGH (ref 11.5–15.5)
WBC: 8.9 10*3/uL (ref 4.0–10.5)

## 2013-02-01 LAB — COMPREHENSIVE METABOLIC PANEL
ALK PHOS: 117 U/L (ref 39–117)
ALT: 13 U/L (ref 0–35)
AST: 15 U/L (ref 0–37)
Albumin: 4.3 g/dL (ref 3.5–5.2)
BILIRUBIN TOTAL: 0.6 mg/dL (ref 0.3–1.2)
BUN: 5 mg/dL — AB (ref 6–23)
CO2: 29 meq/L (ref 19–32)
CREATININE: 0.8 mg/dL (ref 0.50–1.10)
Calcium: 9.8 mg/dL (ref 8.4–10.5)
Chloride: 105 mEq/L (ref 96–112)
GLUCOSE: 87 mg/dL (ref 70–99)
Potassium: 4.1 mEq/L (ref 3.5–5.3)
SODIUM: 140 meq/L (ref 135–145)
Total Protein: 7.1 g/dL (ref 6.0–8.3)

## 2013-02-01 LAB — IRON AND TIBC
%SAT: 27 % (ref 20–55)
IRON: 107 ug/dL (ref 42–145)
TIBC: 398 ug/dL (ref 250–470)
UIBC: 291 ug/dL (ref 125–400)

## 2013-02-01 LAB — FERRITIN: FERRITIN: 22 ng/mL (ref 10–291)

## 2013-02-02 LAB — IGA: IgA: 202 mg/dL (ref 69–380)

## 2013-02-04 ENCOUNTER — Ambulatory Visit (INDEPENDENT_AMBULATORY_CARE_PROVIDER_SITE_OTHER): Payer: Medicare HMO | Admitting: Cardiology

## 2013-02-04 ENCOUNTER — Encounter: Payer: Self-pay | Admitting: Cardiology

## 2013-02-04 ENCOUNTER — Ambulatory Visit (HOSPITAL_COMMUNITY)
Admission: RE | Admit: 2013-02-04 | Discharge: 2013-02-04 | Disposition: A | Discharge status: Home / Self Care | Payer: Medicare HMO | Source: Ambulatory Visit | Attending: Gastroenterology | Admitting: Gastroenterology

## 2013-02-04 VITALS — BP 127/66 | HR 81 | Ht 69.0 in | Wt 171.0 lb

## 2013-02-04 DIAGNOSIS — I272 Pulmonary hypertension, unspecified: Secondary | ICD-10-CM

## 2013-02-04 DIAGNOSIS — R109 Unspecified abdominal pain: Secondary | ICD-10-CM

## 2013-02-04 DIAGNOSIS — I7090 Unspecified atherosclerosis: Secondary | ICD-10-CM

## 2013-02-04 DIAGNOSIS — D509 Iron deficiency anemia, unspecified: Secondary | ICD-10-CM

## 2013-02-04 DIAGNOSIS — I2510 Atherosclerotic heart disease of native coronary artery without angina pectoris: Secondary | ICD-10-CM

## 2013-02-04 DIAGNOSIS — E785 Hyperlipidemia, unspecified: Secondary | ICD-10-CM

## 2013-02-04 DIAGNOSIS — K219 Gastro-esophageal reflux disease without esophagitis: Secondary | ICD-10-CM

## 2013-02-04 DIAGNOSIS — I2789 Other specified pulmonary heart diseases: Secondary | ICD-10-CM

## 2013-02-04 DIAGNOSIS — R197 Diarrhea, unspecified: Secondary | ICD-10-CM

## 2013-02-04 LAB — TISSUE TRANSGLUTAMINASE, IGA: Tissue Transglutaminase Ab, IgA: 4.1 U/mL (ref <20)

## 2013-02-04 NOTE — Progress Notes (Signed)
Clinical Summary Kelly Douglas is a 58 y.o.female former patient of Dr Lattie Haw, this is our first visit together. She is seen for the following medical problems.  1. CAD - non-obstructive CAD by notes, it appears this was noted by CT scan 03/2011. Does not appears she has had a cath or stress test. 04/2011 LVEF 55-60%. - denies any chest pain. Chronic SOB at 1/4 mile which stable.  - no orthopnea, no PND, occas swelling in legs, no palpitations - compliant with meds  2. COPD - compliant with inhalers  3. Scleroderma - working to get plugged back in with rheumatologist - moderate pulm HTN on most recent echo in 2013  4. Hyperlipidemia - last lipid panel 11/2012 TC 142 TG 176 HDL 30 LDL 77 - compliant with statin  Past Medical History  Diagnosis Date  . GERD (gastroesophageal reflux disease)     Dr Gala Romney EGD 09/2009->esophagitis, sm HH, antral erosions, atonic esophagus  . Seasonal allergies   . Arthritis   . Scleroderma   . Chronic lung disease     Chronic scarring and volume loss-left lung; characteristics of a chronic infectious process-possible MAI  . Chronic back pain   . Hypothyroid 1981 approx  . Arteriosclerotic cardiovascular disease (ASCVD) 2013    coronary calcification-left main, LAD and CX; small pericardial effusion  . Tobacco abuse 06/24/2009  . Syncope     Multiple spells over the past 40+ years, likely neurocardiogenic  . Hyperlipidemia   . Depression   . Anxiety   . Chronic bronchitis      No Known Allergies   Current Outpatient Prescriptions  Medication Sig Dispense Refill  . acetaminophen (TYLENOL) 500 MG tablet Take 1,000 mg by mouth every 6 (six) hours as needed for pain.      Marland Kitchen albuterol (PROVENTIL HFA;VENTOLIN HFA) 108 (90 BASE) MCG/ACT inhaler Inhale 2 puffs into the lungs every 6 (six) hours as needed. Shortness of breath  6.7 g  3  . amLODipine (NORVASC) 5 MG tablet Take 1 tablet (5 mg total) by mouth daily.  30 tablet  3  . aspirin  (ASPIRIN EC) 81 MG EC tablet Take 81 mg by mouth daily. Swallow whole.      . ferrous sulfate 325 (65 FE) MG EC tablet Take 1 tablet (325 mg total) by mouth 2 (two) times daily.  60 tablet  3  . gabapentin (NEURONTIN) 300 MG capsule Take 1 capsule (300 mg total) by mouth 3 (three) times daily.  90 capsule  4  . ibuprofen (ADVIL,MOTRIN) 200 MG tablet Take 200 mg by mouth every 6 (six) hours as needed for pain.      Marland Kitchen levothyroxine (SYNTHROID, LEVOTHROID) 75 MCG tablet TAKE 1 TABLET BY MOUTH DAILY  30 tablet  3  . LORazepam (ATIVAN) 0.5 MG tablet Take 1 tablet (0.5 mg total) by mouth at bedtime.  30 tablet  1  . mirtazapine (REMERON) 30 MG tablet Take 0.5 tablets (15 mg total) by mouth at bedtime.  30 tablet  2  . pantoprazole (PROTONIX) 40 MG tablet Take 1 tablet (40 mg total) by mouth daily.  30 tablet  3  . pravastatin (PRAVACHOL) 40 MG tablet TAKE 1 TABLET BY MOUTH EVERY DAY  30 tablet  3  . tiZANidine (ZANAFLEX) 2 MG tablet Take 1 tablet (2 mg total) by mouth at bedtime as needed.  30 tablet  3   No current facility-administered medications for this visit.     Past Surgical  History  Procedure Laterality Date  . Tubal ligation    . Bilateral salpingoophorectomy  2013     Dr. Elonda Husky; uterus remains in situ  . Laparoscopic appendectomy  07/16/2011    Procedure: APPENDECTOMY LAPAROSCOPIC;  Surgeon: Donato Heinz, MD;  Location: [facility];  Service: General;  Laterality: N/A;  . Colonoscopy  09/28/09    anal papilla otherwise normal  . Esophagogastroduodenoscopy  10/07/09    Dr. Barrie Dunker of esophageal mucosa, diffusely ?esophagitis (bx benign), small HH/antral erosions, erythema bx benign. 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 Esophagogastroduodenoscopy (egd) with esophageal dilation N/A 03/07/2012    TWS:[id number], patent, tubular esophagus of uncertain significance-status post biopsy )unremarkable). Hiatal hernia  . Colonoscopy N/A 08/29/2012    ZGY:FVCBSWH polyp-removed as  described above. tubular adenoma     No Known Allergies    Family History  Problem Relation Age of Onset  . Throat cancer Brother   . Stroke Father   . Coronary artery disease Brother   . Down syndrome Brother   . Hypertension Brother     father/mother  . Anesthesia problems Neg Hx   . Hypotension Neg Hx   . Malignant hyperthermia Neg Hx   . Pseudochol deficiency Neg Hx   . Colon cancer Neg Hx      Social History Kelly Douglas reports that she has been smoking Cigarettes.  She has a 20 pack-year smoking history. She does not have any smokeless tobacco history on file. Kelly Douglas reports that she does not drink alcohol.   Review of Systems CONSTITUTIONAL: No weight loss, fever, chills, weakness or fatigue.  HEENT: Eyes: No visual loss, blurred vision, double vision or yellow sclerae.No hearing loss, sneezing, congestion, runny nose or sore throat.  SKIN: No rash or itching.  CARDIOVASCULAR: per HPI RESPIRATORY: per HPI GASTROINTESTINAL: No anorexia, nausea, vomiting or diarrhea. No abdominal pain or blood.  GENITOURINARY: No burning on urination, no polyuria NEUROLOGICAL: No headache, dizziness, syncope, paralysis, ataxia, numbness or tingling in the extremities. No change in bowel or bladder control.  MUSCULOSKELETAL: No muscle, back pain, joint pain or stiffness.  LYMPHATICS: No enlarged nodes. No history of splenectomy.  PSYCHIATRIC: No history of depression or anxiety.  ENDOCRINOLOGIC: No reports of sweating, cold or heat intolerance. No polyuria or polydipsia.  Marland Kitchen   Physical Examination p 81 bp 127/66 Wt 171 lbs BMI 25 Gen: resting comfortably, no acute distress HEENT: no scleral icterus, pupils equal round and reactive, no palptable cervical adenopathy,  CV: RRR, no m/r/g, no JVD, no carotid bruits Resp: Clear to auscultation bilaterally GI: abdomen is soft, non-tender, non-distended, normal bowel sounds, no hepatosplenomegaly MSK: extremities are warm, no  edema.  Skin: warm, no rash Neuro:  no focal deficits Psych: appropriate affect   Diagnostic Studies 04/2011 Echo LVEF 55-60%, no WMAs, PASP 42 mmHg,    02/04/13 Clinic EKG: sinus rhythm, rate 75, normal axis, no chamber enlargement, normal intervals,no ischemic changes  Assessment and Plan  1. CAD - based on prior CT scan, has never had cath or stress test - no symptoms currently - continue risk factor modification  2. COPD - management per PCP, continue inhalers  3. Scleroderma - moderately elevated PASP frmo echo 2013, she needs a repeat echo to follow pulmonary pressures and RV function given her history of scleroderma. - order echo  4. Hyperlipidemia - cholesterol at goal, continue current medications.    Follow up 1 year   Arnoldo Lenis, M.D., F.A.C.C.

## 2013-02-04 NOTE — Patient Instructions (Addendum)
Your physician wants you to follow-up in: one year You will receive a reminder letter in the mail two months in advance. If you don't receive a letter, please call our office to schedule the follow-up appointment.  Your physician has requested that you have an echocardiogram. Echocardiography is a painless test that uses sound waves to create images of your heart. It provides your doctor with information about the size and shape of your heart and how well your heart's chambers and valves are working. This procedure takes approximately one hour. There are no restrictions for this procedure.  WE WILL CALL YOU WITH YOUR TEST RESULTS/INSTRUCTIONS/NEXT STEPS ONCE RECEIVED BY THE PROVIDER

## 2013-02-05 ENCOUNTER — Ambulatory Visit (HOSPITAL_COMMUNITY)
Admission: RE | Admit: 2013-02-05 | Discharge: 2013-02-05 | Disposition: A | Discharge status: Home / Self Care | Payer: Medicare HMO | Source: Ambulatory Visit | Attending: Gastroenterology | Admitting: Gastroenterology

## 2013-02-05 ENCOUNTER — Other Ambulatory Visit (HOSPITAL_COMMUNITY): Payer: Medicare HMO

## 2013-02-05 DIAGNOSIS — K3189 Other diseases of stomach and duodenum: Secondary | ICD-10-CM | POA: Insufficient documentation

## 2013-02-05 DIAGNOSIS — R1900 Intra-abdominal and pelvic swelling, mass and lump, unspecified site: Secondary | ICD-10-CM | POA: Insufficient documentation

## 2013-02-05 DIAGNOSIS — Z9089 Acquired absence of other organs: Secondary | ICD-10-CM | POA: Insufficient documentation

## 2013-02-05 DIAGNOSIS — I708 Atherosclerosis of other arteries: Secondary | ICD-10-CM | POA: Insufficient documentation

## 2013-02-05 DIAGNOSIS — M51379 Other intervertebral disc degeneration, lumbosacral region without mention of lumbar back pain or lower extremity pain: Secondary | ICD-10-CM | POA: Insufficient documentation

## 2013-02-05 DIAGNOSIS — R1013 Epigastric pain: Secondary | ICD-10-CM

## 2013-02-05 DIAGNOSIS — I700 Atherosclerosis of aorta: Secondary | ICD-10-CM | POA: Insufficient documentation

## 2013-02-05 DIAGNOSIS — M5137 Other intervertebral disc degeneration, lumbosacral region: Secondary | ICD-10-CM | POA: Insufficient documentation

## 2013-02-05 DIAGNOSIS — R197 Diarrhea, unspecified: Secondary | ICD-10-CM | POA: Insufficient documentation

## 2013-02-05 MED ORDER — IOHEXOL 300 MG/ML  SOLN
100.0000 mL | Freq: Once | INTRAMUSCULAR | Status: AC | PRN
  Administered 2013-02-05: 100 mL via INTRAVENOUS

## 2013-02-06 ENCOUNTER — Ambulatory Visit (HOSPITAL_COMMUNITY)
Admission: RE | Admit: 2013-02-06 | Discharge: 2013-02-06 | Disposition: A | Discharge status: Home / Self Care | Payer: Medicare HMO | Source: Ambulatory Visit | Attending: Cardiology | Admitting: Cardiology

## 2013-02-06 DIAGNOSIS — I517 Cardiomegaly: Secondary | ICD-10-CM

## 2013-02-06 DIAGNOSIS — I519 Heart disease, unspecified: Secondary | ICD-10-CM | POA: Insufficient documentation

## 2013-02-06 DIAGNOSIS — I2510 Atherosclerotic heart disease of native coronary artery without angina pectoris: Secondary | ICD-10-CM | POA: Insufficient documentation

## 2013-02-06 DIAGNOSIS — I059 Rheumatic mitral valve disease, unspecified: Secondary | ICD-10-CM | POA: Insufficient documentation

## 2013-02-06 DIAGNOSIS — F17200 Nicotine dependence, unspecified, uncomplicated: Secondary | ICD-10-CM | POA: Insufficient documentation

## 2013-02-06 DIAGNOSIS — E785 Hyperlipidemia, unspecified: Secondary | ICD-10-CM | POA: Insufficient documentation

## 2013-02-06 DIAGNOSIS — J4489 Other specified chronic obstructive pulmonary disease: Secondary | ICD-10-CM | POA: Insufficient documentation

## 2013-02-06 DIAGNOSIS — J449 Chronic obstructive pulmonary disease, unspecified: Secondary | ICD-10-CM | POA: Insufficient documentation

## 2013-02-06 DIAGNOSIS — I079 Rheumatic tricuspid valve disease, unspecified: Secondary | ICD-10-CM | POA: Insufficient documentation

## 2013-02-06 DIAGNOSIS — K219 Gastro-esophageal reflux disease without esophagitis: Secondary | ICD-10-CM | POA: Insufficient documentation

## 2013-02-06 DIAGNOSIS — I319 Disease of pericardium, unspecified: Secondary | ICD-10-CM | POA: Insufficient documentation

## 2013-02-06 DIAGNOSIS — I379 Nonrheumatic pulmonary valve disorder, unspecified: Secondary | ICD-10-CM | POA: Insufficient documentation

## 2013-02-06 NOTE — Progress Notes (Signed)
*  PRELIMINARY RESULTS* Echocardiogram 2D Echocardiogram has been performed.  [place], [place] 02/06/2013, 2:44 PM

## 2013-02-07 ENCOUNTER — Other Ambulatory Visit: Payer: Self-pay | Admitting: Gastroenterology

## 2013-02-07 DIAGNOSIS — D509 Iron deficiency anemia, unspecified: Secondary | ICD-10-CM

## 2013-02-07 MED ORDER — DICYCLOMINE HCL 10 MG PO CAPS: 10.0000 mg | ORAL_CAPSULE | ORAL | Status: DC | PRN

## 2013-03-01 ENCOUNTER — Other Ambulatory Visit: Payer: Self-pay | Admitting: Family Medicine

## 2013-03-06 ENCOUNTER — Other Ambulatory Visit: Payer: Self-pay

## 2013-03-06 MED ORDER — PANTOPRAZOLE SODIUM 40 MG PO TBEC: DELAYED_RELEASE_TABLET | ORAL | Status: DC

## 2013-03-14 ENCOUNTER — Other Ambulatory Visit: Payer: Self-pay | Admitting: Family Medicine

## 2013-03-14 DIAGNOSIS — Z139 Encounter for screening, unspecified: Secondary | ICD-10-CM

## 2013-03-19 LAB — LIPID PANEL
Cholesterol: 125 mg/dL (ref 0–200)
HDL: 33 mg/dL — ABNORMAL LOW (ref >39)
LDL Cholesterol: 65 mg/dL (ref 0–99)
Total CHOL/HDL Ratio: 3.8 Ratio
Triglycerides: 135 mg/dL (ref <150)
VLDL: 27 mg/dL (ref 0–40)

## 2013-03-19 LAB — COMPREHENSIVE METABOLIC PANEL
ALT: 10 U/L (ref 0–35)
AST: 13 U/L (ref 0–37)
Albumin: 4.5 g/dL (ref 3.5–5.2)
Alkaline Phosphatase: 126 U/L — ABNORMAL HIGH (ref 39–117)
BUN: 8 mg/dL (ref 6–23)
CO2: 29 mEq/L (ref 19–32)
Calcium: 10 mg/dL (ref 8.4–10.5)
Chloride: 104 mEq/L (ref 96–112)
Creat: 0.77 mg/dL (ref 0.50–1.10)
Glucose, Bld: 89 mg/dL (ref 70–99)
Potassium: 3.8 mEq/L (ref 3.5–5.3)
Sodium: 140 mEq/L (ref 135–145)
Total Bilirubin: 0.3 mg/dL (ref 0.2–1.2)
Total Protein: 7.4 g/dL (ref 6.0–8.3)

## 2013-03-19 LAB — HEMOGLOBIN A1C
Hgb A1c MFr Bld: 6 % — ABNORMAL HIGH (ref <5.7)
Mean Plasma Glucose: 126 mg/dL — ABNORMAL HIGH (ref <117)

## 2013-03-19 LAB — TSH: TSH: 0.832 u[IU]/mL (ref 0.350–4.500)

## 2013-03-21 ENCOUNTER — Other Ambulatory Visit: Payer: Self-pay | Admitting: Family Medicine

## 2013-04-03 ENCOUNTER — Encounter (INDEPENDENT_AMBULATORY_CARE_PROVIDER_SITE_OTHER): Payer: Self-pay

## 2013-04-03 ENCOUNTER — Ambulatory Visit (INDEPENDENT_AMBULATORY_CARE_PROVIDER_SITE_OTHER): Payer: Commercial Managed Care - HMO | Admitting: Family Medicine

## 2013-04-03 ENCOUNTER — Other Ambulatory Visit (HOSPITAL_COMMUNITY)
Admission: RE | Admit: 2013-04-03 | Discharge: 2013-04-03 | Disposition: A | Discharge status: Home / Self Care | Payer: Medicare HMO | Source: Ambulatory Visit | Attending: Family Medicine | Admitting: Family Medicine

## 2013-04-03 ENCOUNTER — Encounter: Payer: Self-pay | Admitting: Family Medicine

## 2013-04-03 VITALS — BP 122/74 | HR 92 | Resp 18 | Ht 69.0 in | Wt 170.0 lb

## 2013-04-03 DIAGNOSIS — Z1212 Encounter for screening for malignant neoplasm of rectum: Secondary | ICD-10-CM

## 2013-04-03 DIAGNOSIS — M549 Dorsalgia, unspecified: Secondary | ICD-10-CM

## 2013-04-03 DIAGNOSIS — M48061 Spinal stenosis, lumbar region without neurogenic claudication: Secondary | ICD-10-CM

## 2013-04-03 DIAGNOSIS — Z124 Encounter for screening for malignant neoplasm of cervix: Secondary | ICD-10-CM | POA: Insufficient documentation

## 2013-04-03 DIAGNOSIS — G4700 Insomnia, unspecified: Secondary | ICD-10-CM

## 2013-04-03 DIAGNOSIS — Z1211 Encounter for screening for malignant neoplasm of colon: Secondary | ICD-10-CM

## 2013-04-03 DIAGNOSIS — Z1151 Encounter for screening for human papillomavirus (HPV): Secondary | ICD-10-CM | POA: Insufficient documentation

## 2013-04-03 DIAGNOSIS — Z0000 Encounter for general adult medical examination without abnormal findings: Secondary | ICD-10-CM

## 2013-04-03 LAB — POC HEMOCCULT BLD/STL (OFFICE/1-CARD/DIAGNOSTIC): Fecal Occult Blood, POC: NEGATIVE

## 2013-04-03 MED ORDER — TRAMADOL HCL 50 MG PO TABS: ORAL_TABLET | ORAL | Status: DC

## 2013-04-03 MED ORDER — TRAZODONE HCL 50 MG PO TABS: ORAL_TABLET | ORAL | Status: DC

## 2013-04-03 NOTE — Patient Instructions (Addendum)
F/u in 2 month, call if you need me before   New medication for sleep is trazodone, stop remron  New for back pain is tramadol one twice daily  Work on smoking cessation  March blood work shows excellent cholesterol but sugar is slightly high, please reduce sugar and carb intake  Fall Prevention and Home Safety Falls cause injuries and can affect all age groups. It is possible to prevent falls.  HOW TO PREVENT FALLS  Wear shoes with rubber soles that do not have an opening for your toes.  Keep the inside and outside of your house well lit.  Use night lights throughout your home.  Remove clutter from floors.  Clean up floor spills.  Remove throw rugs or fasten them to the floor with carpet tape.  Do not place electrical cords across pathways.  Put grab bars by your tub, shower, and toilet. Do not use towel bars as grab bars.  Put handrails on both sides of the stairway. Fix loose handrails.  Do not climb on stools or stepladders, if possible.  Do not wax your floors.  Repair uneven or unsafe sidewalks, walkways, or stairs.  Keep items you use a lot within reach.  Be aware of pets.  Keep emergency numbers next to the telephone.  Put smoke detectors in your home and near bedrooms. Ask your doctor what other things you can do to prevent falls. Document Released: 10/30/2008 Document Revised: 07/05/2011 Document Reviewed: 04/05/2011 [facility] Patient Information 2014 [place], [place].

## 2013-04-03 NOTE — Progress Notes (Signed)
   Subjective:    Patient ID: Kelly Douglas, female    DOB: [birth year], 58 y.o.   MRN: [id number]  HPI Patient is in for annual physical exam. C/o uncontrolled back pain, also poor sleep. Medications are prescribed for both with f/u     Review of Systems     Objective:   Physical Exam BP 122/74  Pulse 92  Resp 18  Ht 5\' 9"  (1.753 m)  Wt 170 lb 0.6 oz (77.13 kg)  BMI 25.10 kg/m2  SpO2 92%  Pleasant well nourished female, alert and oriented x 3, in no cardio-pulmonary distress. Afebrile. HEENT No facial trauma or asymetry. Sinuses non tender.  Extra occullar muscles intact, pupils equally reactive to light. External ears normal, tympanic membranes clear. Oropharynx moist, no exudate, very poor  dentition. Neck: supple, no adenopathy,JVD or thyromegaly.No bruits.  Chest: Clear to ascultation bilaterally.No crackles or wheezes.Decreased though adequate air entry Non tender to palpation  Breast: No asymetry,no masses or lumps. No tenderness. No nipple discharge or inversion. No axillary or supraclavicular adenopathy  Cardiovascular system; Heart sounds normal,  S1 and  S2 ,no S3.  No murmur, or thrill. Apical beat not displaced Peripheral pulses normal.  Abdomen: Soft, non tender, no organomegaly or masses. No bruits. Bowel sounds normal. No guarding, tenderness or rebound.  Rectal:  Normal sphincter tone. No mass.No rectal masses.  Guaiac negative stool.  GU: External genitalia normal female genitalia , female distribution of hair. No lesions. Urethral meatus normal in size, no  Prolapse, no lesions visibly  Present. Bladder non tender. Vagina pink and moist , with no visible lesions , discharge present . Adequate pelvic support no  cystocele or rectocele noted Cervix pink and appears healthy, no lesions or ulcerations noted, no discharge noted from os Uterus normal size, no adnexal masses, no cervical motion or adnexal tenderness.   Musculoskeletal  exam: Decreased though adequate ROM of spine, hips , shoulders and knees. No deformity ,swelling or crepitus noted. No muscle wasting or atrophy.   Neurologic: Cranial nerves 2 to 12 intact. Power, tone ,sensation and reflexes normal throughout. No disturbance in gait. No tremor.  Skin: Intact, no ulceration, skin noted to be dry, and pt has areas of hypo pigmentation   Psych; Normal mood and affect. Judgement and concentration normal        Assessment & Plan:  Encounter for annual physical exam Annual exam as documented. Counseling done  re healthy lifestyle involving commitment to 150 minutes exercise per week, heart healthy diet, and attaining healthy weight.The importance of adequate sleep also discussed. Regular seat belt use and home safety, is also discussed. Changes in health habits are decided on by the patient with goals and time frames  set for achieving them. Immunization and cancer screening needs are specifically addressed at this visit.   Back pain with radiation Uncontrolled , has severe stenosis, tramadol prescrinbed  Insomnia Sleep hygiene reviewed and written information offered also. Prescription sent for  medication needed.   Special screening for malignant neoplasms, colon Rectal exam as documented, stool is heme negative

## 2013-04-09 ENCOUNTER — Other Ambulatory Visit: Payer: Self-pay

## 2013-04-09 DIAGNOSIS — D509 Iron deficiency anemia, unspecified: Secondary | ICD-10-CM

## 2013-04-11 ENCOUNTER — Ambulatory Visit (HOSPITAL_COMMUNITY)
Admission: RE | Admit: 2013-04-11 | Discharge: 2013-04-11 | Disposition: A | Discharge status: Home / Self Care | Payer: Medicare HMO | Source: Ambulatory Visit | Attending: Family Medicine | Admitting: Family Medicine

## 2013-04-11 DIAGNOSIS — Z139 Encounter for screening, unspecified: Secondary | ICD-10-CM

## 2013-04-11 DIAGNOSIS — Z1231 Encounter for screening mammogram for malignant neoplasm of breast: Secondary | ICD-10-CM | POA: Insufficient documentation

## 2013-05-04 LAB — CBC WITH DIFFERENTIAL/PLATELET
Basophils Absolute: 0 10*3/uL (ref 0.0–0.1)
Basophils Relative: 0 % (ref 0–1)
Eosinophils Absolute: 0.1 10*3/uL (ref 0.0–0.7)
Eosinophils Relative: 1 % (ref 0–5)
HEMATOCRIT: 36.7 % (ref 36.0–46.0)
HEMOGLOBIN: 12.5 g/dL (ref 12.0–15.0)
LYMPHS PCT: 47 % — AB (ref 12–46)
Lymphs Abs: 4.5 10*3/uL — ABNORMAL HIGH (ref 0.7–4.0)
MCH: 25.6 pg — ABNORMAL LOW (ref 26.0–34.0)
MCHC: 34.1 g/dL (ref 30.0–36.0)
MCV: 75.2 fL — ABNORMAL LOW (ref 78.0–100.0)
MONO ABS: 0.8 10*3/uL (ref 0.1–1.0)
MONOS PCT: 8 % (ref 3–12)
NEUTROS ABS: 4.2 10*3/uL (ref 1.7–7.7)
NEUTROS PCT: 44 % (ref 43–77)
Platelets: 306 10*3/uL (ref 150–400)
RBC: 4.88 MIL/uL (ref 3.87–5.11)
RDW: 16.8 % — ABNORMAL HIGH (ref 11.5–15.5)
WBC: 9.6 10*3/uL (ref 4.0–10.5)

## 2013-05-04 LAB — FERRITIN: FERRITIN: 30 ng/mL (ref 10–291)

## 2013-05-15 NOTE — Progress Notes (Signed)
Quick Note:  Labs are stable.  She was supposed to have OV with RMR but this was not made. Please schedule nonurgent OV with RMR only (has not been seen in ov by him since 2011). OV for IDA, diarrhea, abd pain. ______

## 2013-06-03 ENCOUNTER — Ambulatory Visit: Payer: Medicare HMO | Admitting: Family Medicine

## 2013-06-04 ENCOUNTER — Other Ambulatory Visit: Payer: Self-pay | Admitting: Family Medicine

## 2013-06-04 ENCOUNTER — Other Ambulatory Visit: Payer: Self-pay | Admitting: Cardiology

## 2013-06-07 ENCOUNTER — Other Ambulatory Visit: Payer: Self-pay | Admitting: Family Medicine

## 2013-06-08 ENCOUNTER — Other Ambulatory Visit: Payer: Self-pay | Admitting: Family Medicine

## 2013-06-11 ENCOUNTER — Other Ambulatory Visit: Payer: Self-pay | Admitting: Family Medicine

## 2013-06-12 ENCOUNTER — Other Ambulatory Visit: Payer: Self-pay | Admitting: Family Medicine

## 2013-06-21 ENCOUNTER — Ambulatory Visit (INDEPENDENT_AMBULATORY_CARE_PROVIDER_SITE_OTHER): Payer: Medicare HMO | Admitting: Family Medicine

## 2013-06-21 ENCOUNTER — Encounter (INDEPENDENT_AMBULATORY_CARE_PROVIDER_SITE_OTHER): Payer: Self-pay

## 2013-06-21 ENCOUNTER — Other Ambulatory Visit: Payer: Self-pay | Admitting: Internal Medicine

## 2013-06-21 ENCOUNTER — Encounter: Payer: Self-pay | Admitting: Internal Medicine

## 2013-06-21 ENCOUNTER — Ambulatory Visit (INDEPENDENT_AMBULATORY_CARE_PROVIDER_SITE_OTHER): Payer: Medicare HMO | Admitting: Internal Medicine

## 2013-06-21 ENCOUNTER — Encounter: Payer: Self-pay | Admitting: Family Medicine

## 2013-06-21 ENCOUNTER — Ambulatory Visit (HOSPITAL_COMMUNITY)
Admission: RE | Admit: 2013-06-21 | Discharge: 2013-06-21 | Disposition: A | Discharge status: Home / Self Care | Payer: Medicare HMO | Source: Ambulatory Visit | Attending: Family Medicine | Admitting: Family Medicine

## 2013-06-21 VITALS — BP 129/76 | HR 82 | Temp 97.1°F | Ht 69.0 in | Wt 166.2 lb

## 2013-06-21 VITALS — BP 138/78 | HR 72 | Resp 16 | Wt 167.0 lb

## 2013-06-21 DIAGNOSIS — R197 Diarrhea, unspecified: Secondary | ICD-10-CM

## 2013-06-21 DIAGNOSIS — M25539 Pain in unspecified wrist: Secondary | ICD-10-CM

## 2013-06-21 DIAGNOSIS — F329 Major depressive disorder, single episode, unspecified: Secondary | ICD-10-CM

## 2013-06-21 DIAGNOSIS — Z720 Tobacco use: Secondary | ICD-10-CM

## 2013-06-21 DIAGNOSIS — F32A Depression, unspecified: Secondary | ICD-10-CM

## 2013-06-21 DIAGNOSIS — G4700 Insomnia, unspecified: Secondary | ICD-10-CM

## 2013-06-21 DIAGNOSIS — K0510 Chronic gingivitis, plaque induced: Secondary | ICD-10-CM

## 2013-06-21 DIAGNOSIS — M25519 Pain in unspecified shoulder: Secondary | ICD-10-CM | POA: Insufficient documentation

## 2013-06-21 DIAGNOSIS — F17200 Nicotine dependence, unspecified, uncomplicated: Secondary | ICD-10-CM

## 2013-06-21 DIAGNOSIS — M549 Dorsalgia, unspecified: Secondary | ICD-10-CM

## 2013-06-21 DIAGNOSIS — M25512 Pain in left shoulder: Secondary | ICD-10-CM

## 2013-06-21 DIAGNOSIS — I10 Essential (primary) hypertension: Secondary | ICD-10-CM

## 2013-06-21 DIAGNOSIS — F419 Anxiety disorder, unspecified: Principal | ICD-10-CM

## 2013-06-21 DIAGNOSIS — E785 Hyperlipidemia, unspecified: Secondary | ICD-10-CM

## 2013-06-21 DIAGNOSIS — F341 Dysthymic disorder: Secondary | ICD-10-CM

## 2013-06-21 DIAGNOSIS — M542 Cervicalgia: Secondary | ICD-10-CM | POA: Insufficient documentation

## 2013-06-21 DIAGNOSIS — M25531 Pain in right wrist: Secondary | ICD-10-CM

## 2013-06-21 MED ORDER — CEPHALEXIN 500 MG PO CAPS: 500.0000 mg | ORAL_CAPSULE | Freq: Three times a day (TID) | ORAL | Status: DC

## 2013-06-21 MED ORDER — BUSPIRONE HCL 5 MG PO TABS: 5.0000 mg | ORAL_TABLET | Freq: Three times a day (TID) | ORAL | Status: DC

## 2013-06-21 MED ORDER — TRAZODONE HCL 100 MG PO TABS: 100.0000 mg | ORAL_TABLET | Freq: Every day | ORAL | Status: DC

## 2013-06-21 MED ORDER — LEVOTHYROXINE SODIUM 75 MCG PO TABS: ORAL_TABLET | ORAL | Status: DC

## 2013-06-21 NOTE — Progress Notes (Signed)
Primary Care Physician:  Tula Nakayama, MD Primary Gastroenterologist:  Dr. Gala Romney  Pre-Procedure History & Physical: HPI:  Kelly Douglas is a 58 y.o. female here for several month history of diarrhea. GI pathogen panel negative, celiac screen negative. No evidence of inflammatory bowel disease on last colonoscopy 2014 ( however, procedure was not done for diarrhea; biopsies for microscopic colitis not performed). Diarrhea somewhat sporadic. She describes lactose intolerance. She hasn't had any bleeding. She is down 8 pounds since her last office visit.  She denies melena or hematochezia. Appetite well maintained. She states epistaxis has tapered off. Last CBC looked good.  Still taking iron.  Past Medical History  Diagnosis Date  . GERD (gastroesophageal reflux disease)     Dr Gala Romney EGD 09/2009->esophagitis, sm HH, antral erosions, atonic esophagus  . Seasonal allergies   . Arthritis   . Scleroderma   . Chronic lung disease     Chronic scarring and volume loss-left lung; characteristics of a chronic infectious process-possible MAI  . Chronic back pain   . Hypothyroid 1981 approx  . Arteriosclerotic cardiovascular disease (ASCVD) 2013    coronary calcification-left main, LAD and CX; small pericardial effusion  . Tobacco abuse 06/24/2009  . Syncope     Multiple spells over the past 40+ years, likely neurocardiogenic  . Hyperlipidemia   . Depression   . Anxiety   . Chronic bronchitis     Past Surgical History  Procedure Laterality Date  . Tubal ligation    . Bilateral salpingoophorectomy  2013     Dr. Elonda Husky; uterus remains in situ  . Laparoscopic appendectomy  07/16/2011    Procedure: APPENDECTOMY LAPAROSCOPIC;  Surgeon: Donato Heinz, MD;  Location: [facility];  Service: General;  Laterality: N/A;  . Colonoscopy  09/28/09    anal papilla otherwise normal  . Esophagogastroduodenoscopy  10/07/09    Dr. Barrie Dunker of esophageal mucosa, diffusely ?esophagitis (bx benign),  small HH/antral erosions, erythema bx benign. 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 Esophagogastroduodenoscopy (egd) with esophageal dilation N/A 03/07/2012    [id number], patent, tubular esophagus of uncertain significance-status post biopsy )unremarkable). Hiatal hernia  . Colonoscopy N/A 08/29/2012    [id number] polyp-removed as described above. tubular adenoma    Prior to Admission medications   Medication Sig Start Date End Date Taking? Authorizing Provider  acetaminophen (TYLENOL) 500 MG tablet Take 1,000 mg by mouth every 6 (six) hours as needed for pain.   Yes Historical Provider, MD  albuterol (PROVENTIL HFA;VENTOLIN HFA) 108 (90 BASE) MCG/ACT inhaler Inhale 2 puffs into the lungs every 6 (six) hours as needed. Shortness of breath 08/02/12  Yes [name], MD  amLODipine (NORVASC) 5 MG tablet TAKE 1 TABLET BY MOUTH EVERY DAY 06/12/13  Yes [name], MD  aspirin (ASPIRIN EC) 81 MG EC tablet Take 81 mg by mouth daily. Swallow whole.   Yes Historical Provider, MD  busPIRone (BUSPAR) 5 MG tablet Take 1 tablet (5 mg total) by mouth 3 (three) times daily. 06/21/13  Yes [name], MD  cephALEXin (KEFLEX) 500 MG capsule Take 1 capsule (500 mg total) by mouth 3 (three) times daily. 06/21/13  Yes [name], MD  dicyclomine (BENTYL) 10 MG capsule Take 1 capsule (10 mg total) by mouth as needed (for diarrhea and abdominal cramps. Do not exceed four capsules daily.). 02/07/13  Yes Mahala Menghini, PA-C  ferrous sulfate 325 (65 FE) MG EC tablet Take 1 tablet (325 mg total) by mouth 2 (  two) times daily. 01/29/13  Yes Mahala Menghini, PA-C  gabapentin (NEURONTIN) 300 MG capsule TAKE 1 CAPSULE BY MOUTH THREE TIMES DAILY 03/21/13  Yes [name], MD  levothyroxine (SYNTHROID, LEVOTHROID) 75 MCG tablet TAKE 1 TABLET BY MOUTH EVERY DAY 06/21/13  Yes [name], MD  NON FORMULARY Pt to start Keflex for gum infection. She does not know the strength.   Yes Historical  Provider, MD  pantoprazole (PROTONIX) 40 MG tablet TAKE 1 TABLET BY MOUTH DAILY 03/06/13  Yes [name], MD  pravastatin (PRAVACHOL) 40 MG tablet TAKE 1 TABLET BY MOUTH EVERY DAY 11/01/12  Yes [name], MD  traZODone (DESYREL) 50 MG tablet One tablet at bedtime for sleep 04/03/13  Yes [name], MD  traZODone (DESYREL) 100 MG tablet Take 1 tablet (100 mg total) by mouth at bedtime. 06/21/13   [name], MD    Allergies as of 06/21/2013  . (No Known Allergies)    Family History  Problem Relation Age of Onset  . Throat cancer Brother   . Stroke Father   . Coronary artery disease Brother   . Down syndrome Brother   . Hypertension Brother     father/mother  . Anesthesia problems Neg Hx   . Hypotension Neg Hx   . Malignant hyperthermia Neg Hx   . Pseudochol deficiency Neg Hx   . Colon cancer Neg Hx     History   Social History  . Marital Status: Married    Spouse Name: N/A    Number of Children: 2  . Years of Education: N/A   Occupational History  . disabled    Social History Main Topics  . Smoking status: Current Every Day Smoker -- 1.00 packs/day for 40 years    Types: Cigarettes  . Smokeless tobacco: Not on file     Comment: Smokes about a pack of cigarettes a day  . Alcohol Use: No     Comment: quit June 2012-used to drink 6 pack daily  . Drug Use: No  . Sexual Activity: Yes    Birth Control/ Protection: Post-menopausal   Other Topics Concern  . Not on file   Social History Narrative  . No narrative on file    Review of Systems: See HPI, otherwise negative ROS  Physical Exam: BP 129/76  Pulse 82  Temp(Src) 97.1 F (36.2 C) (Oral)  Ht 5\' 9"  (1.753 m)  Wt 166 lb 3.2 oz (75.388 kg)  BMI 24.53 kg/m2 General:   Alert,  Well-developed, well-nourished, pleasant and cooperative in NAD Skin:  Intact without significant lesions or rashes. Eyes:  Sclera clear, no icterus.   Conjunctiva pink. Ears:  Normal auditory  acuity. Nose:  No deformity, discharge,  or lesions. Mouth:  No deformity or lesions. Neck:  Supple; no masses or thyromegaly. No significant cervical adenopathy. Lungs:  Clear throughout to auscultation.   No wheezes, crackles, or rhonchi. No acute distress. Heart:  Regular rate and rhythm; no murmurs, clicks, rubs,  or gallops. Abdomen: Non-distended, normal bowel sounds.  Soft and nontender without appreciable mass or hepatosplenomegaly.  Pulses:  Normal pulses noted. Extremities:  Without clubbing or edema.  Impression:  58 year old lady with a several month history of intermittent nonbloody diarrhea. Symptoms are sporadic with relatively long periods of normal bowel function. She did not have diarrhea reported at the time of her colonoscopy last year where no evidence of inflammatory bowel disease was found. Prior GI pathogen panel negative.  Celiac screen negative. History of iron deficiency anemia which may, in fact, due to blood loss from epistaxis. She has not had any sustained periods of diarrhea. On her own,  she took a drug holiday (not authorized by prescribers). She states her bowel function now currently back to baseline. She does describe lactose intolerance. She is avoiding dairy products now. I do not see any medications to implicate as a cause of diarrhea. I am skeptical of her reported improvement in diarrhea with her drug holiday. Again, prior to this past year, she denies any prior issues with diarrhea. Chronic intermittent right-sided abdominal pain for at least 30 years. CT negative recently.  At this time, microscopic colitis or even occult pancreatic exocrine deficiency not excluded.  I suppose with scleroderma she could a bacterial overgrowth issue. Lactose intolerance certainly could also be a contributing factor. I would be hesitant to give her a diagnosis of irritable bowel syndrome at this time. Epistaxis is a reasonable explanation for iron deficiency anemia. If not  epistasis, then would need to consider further evaluation of her small bowel in terms of iron deficiency anemia/chronic diarrhea via a capsule study.    Recommendations:   For now:     Keep a stool diary.  Continue prescribed medications including Bentyl for diarrhea. CBC and OV in 6 weeks.  Avoid dairy products     Notice: This dictation was prepared with Dragon dictation along with smaller phrase technology. Any transcriptional errors that result from this process are unintentional and may not be corrected upon review.

## 2013-06-21 NOTE — Patient Instructions (Addendum)
Keep a stool diary  Continue prescribed medications including Bentyl for diarrhea  CBC and OV in 6 weeks  Avoid dairy products

## 2013-06-21 NOTE — Patient Instructions (Signed)
F/u in 2.5 month, call if you need me before  Increased dose  in medication for sleep  New medication buspar for anxiety  Pain needs will be addressed as we discussed  Medication sent in for dental and gum infection, keflex  Please work on quitting smoking again   X ray of left shoulder and right wrist  MRI of neck and lumbar spine  Maximum tylenol per day is 500mg  , four tablets, and do not do this every day  Smoking Cessation, Tips for Success If you are ready to quit smoking, congratulations! You have chosen to help yourself be healthier. Cigarettes bring nicotine, tar, carbon monoxide, and other irritants into your body. Your lungs, heart, and blood vessels will be able to work better without these poisons. There are many different ways to quit smoking. Nicotine gum, nicotine patches, a nicotine inhaler, or nicotine nasal spray can help with physical craving. Hypnosis, support groups, and medicines help break the habit of smoking. WHAT THINGS CAN I DO TO MAKE QUITTING EASIER?  Here are some tips to help you quit for good:  Pick a date when you will quit smoking completely. Tell all of your friends and family about your plan to quit on that date.  Do not try to slowly cut down on the number of cigarettes you are smoking. Pick a quit date and quit smoking completely starting on that day.  Throw away all cigarettes.   Clean and remove all ashtrays from your home, work, and car.   On a card, write down your reasons for quitting. Carry the card with you and read it when you get the urge to smoke.   Cleanse your body of nicotine. Drink enough water and fluids to keep your urine clear or pale yellow. Do this after quitting to flush the nicotine from your body.   Learn to predict your moods. Do not let a bad situation be your excuse to have a cigarette. Some situations in your life might tempt you into wanting a cigarette.   Never have "just one" cigarette. It leads to wanting  another and another. Remind yourself of your decision to quit.   Change habits associated with smoking. If you smoked while driving or when feeling stressed, try other activities to replace smoking. Stand up when drinking your coffee. Brush your teeth after eating. Sit in a different chair when you read the paper. Avoid alcohol while trying to quit, and try to drink fewer caffeinated beverages. Alcohol and caffeine may urge you to smoke.   Avoid foods and drinks that can trigger a desire to smoke, such as sugary or spicy foods and alcohol.   Ask people who smoke not to smoke around you.   Have something planned to do right after eating or having a cup of coffee. For example, plan to take a walk or exercise.   Try a relaxation exercise to calm you down and decrease your stress. Remember, you may be tense and nervous for the first 2 weeks after you quit, but this will pass.   Find new activities to keep your hands busy. Play with a pen, coin, or rubber band. Doodle or draw things on paper.   Brush your teeth right after eating. This will help cut down on the craving for the taste of tobacco after meals. You can also try mouthwash.   Use oral substitutes in place of cigarettes. Try using lemon drops, carrots, cinnamon sticks, or chewing gum. Keep them handy so they  are available when you have the urge to smoke.   When you have the urge to smoke, try deep breathing.   Designate your home as a nonsmoking area.   If you are a heavy smoker, ask your health care provider about a prescription for nicotine chewing gum. It can ease your withdrawal from nicotine.   Reward yourself. Set aside the cigarette money you save and buy yourself something nice.   Look for support from others. Join a support group or smoking cessation program. Ask someone at home or at work to help you with your plan to quit smoking.   Always ask yourself, "Do I need this cigarette or is this just a reflex?" Tell  yourself, "Today, I choose not to smoke," or "I do not want to smoke." You are reminding yourself of your decision to quit.  Do not replace cigarette smoking with electronic cigarettes (commonly called e-cigarettes). The safety of e-cigarettes is unknown, and some may contain harmful chemicals.  If you relapse, do not give up! Plan ahead and think about what you will do the next time you get the urge to smoke.  HOW WILL I FEEL WHEN I QUIT SMOKING? You may have symptoms of withdrawal because your body is used to nicotine (the addictive substance in cigarettes). You may crave cigarettes, be irritable, feel very hungry, cough often, get headaches, or have difficulty concentrating. The withdrawal symptoms are only temporary. They are strongest when you first quit but will go away within 10 14 days. When withdrawal symptoms occur, stay in control. Think about your reasons for quitting. Remind yourself that these are signs that your body is healing and getting used to being without cigarettes. Remember that withdrawal symptoms are easier to treat than the major diseases that smoking can cause.  Even after the withdrawal is over, expect periodic urges to smoke. However, these cravings are generally short lived and will go away whether you smoke or not. Do not smoke!  WHAT RESOURCES ARE AVAILABLE TO HELP ME QUIT SMOKING? Your health care provider can direct you to community resources or hospitals for support, which may include:  Group support.  Education.  Hypnosis.  Therapy. Document Released: 10/02/2003 Document Revised: 10/24/2012 Document Reviewed: 06/21/2012 [facility] Patient Information 2014 [place], [place].

## 2013-06-22 DIAGNOSIS — M25519 Pain in unspecified shoulder: Secondary | ICD-10-CM | POA: Insufficient documentation

## 2013-06-22 DIAGNOSIS — M25531 Pain in right wrist: Secondary | ICD-10-CM | POA: Insufficient documentation

## 2013-06-22 NOTE — Assessment & Plan Note (Signed)
Uncontrolled ongoing anxiey, trial of buspar, bhavior modification discussed

## 2013-06-22 NOTE — Assessment & Plan Note (Signed)
X ray right wrist, thoiugh appears to be tenderness over ulnar tendon more so than joint pain

## 2013-06-22 NOTE — Assessment & Plan Note (Signed)
Slight improvement but still uncontrolled, inc trazodone to 100mg  at bedtime

## 2013-06-22 NOTE — Progress Notes (Signed)
Subjective:    Patient ID: Kelly Douglas, female    DOB: [birth year], 58 y.o.   MRN: [id number]  HPI The PT is here for follow up and re-evaluation of chronic medical conditions, medication management and review of any available recent lab and radiology data.  Preventive health is updated, specifically  Cancer screening and Immunization.   Persistent diarheah, which is being evaluated by GI The PT denies any adverse reactions to current medications since the last visit.  C/o increased and uncontrolled neck and low back pain, sttaes tramadol was of no benefit. Record review reveals arthritis and disc disease  In low back, offered to change her to hydrocodone after UDS testing done, and she clearly need re imaging. Pt states that she has taken her spouse's hydrocodone for pain relief on occasion, so I discussed the danger of taking med not prescribed for her and she will need to hold on UDS as a rsult, also explained that in the event she is started on narcotic pain med, she will not be able to take other people's meds or give the medication to someone, states she understands C/o right wrist pain and left shoulder pain with reduced mobility, pain is along  ulnar border of wrist C/o poor sleep, slightly better with trazodone , so will inc the dose, sleep hygiene is reviewed Has ongoing excessive anxiety, needs to be treated, never relaxes, mind races C/o gum pain and swelling in the setting of severe dental caries, has no money to work on teeth at this time, requests antibiotic coverage short term a gum hurts     Review of Systems See HPI Denies recent fever or chills. Denies sinus pressure, nasal congestion, ear pain or sore throat. Denies chest congestion,has chronic smoker's  cough denies  wheezing. Denies chest pains, palpitations and leg swelling  Denies dysuria, frequency, hesitancy or incontinence.  Denies headaches, seizures, numbness, or tingling.  Denies skin break down or  rash.        Objective:   Physical Exam BP 138/78  Pulse 72  Resp 16  Wt 167 lb (75.751 kg)  SpO2 97% Patient alert and oriented and in no cardiopulmonary distress.  HEENT: No facial asymmetry, EOMI, no sinus tenderness,  oropharynx pink and moist.  Neck decreased ROM, no jVD, no adenopathy.Very poor dentition with multiple caries, and gingivitis  Chest: Clear to auscultation bilaterally.Decreased air entry  CVS: S1, S2 no murmurs, no S3.  ABD: Soft non tender..  Ext: No edema  MS: decreased ROM spine,left shoulder and right wrist   Skin: Intact, no ulcerations or rash noted.  Psych: Good eye contact, normal affect. Memory intact,anxious not depressed appearing.Pressure of speech  CNS: CN 2-12 intact        Assessment & Plan:  Cervical pain (neck) Uncontrolled neck pain radiating to both shoulders and upper arms , left more than right, tingling and shocking pains in upper arms, suggestive of nerve impingement, needs MRI , reports not much relief with gabapentin, may benefit from epidural injections  Back pain with radiation Uncontrolled pain which is debilitating, has established stenosis and disc disease from prior studies , needs re imaging  Tobacco abuse Unchanged and states she is stressed by spouse, unwilling to committing to quitting at this time Patient counseled for approximately 5 minutes regarding the health risks of ongoing nicotine use, specifically all types of cancer, heart disease, stroke and respiratory failure. The options available for help with cessation ,the behavioral changes to assist the process,  and the option to either gradully reduce usage  Or abruptly stop.is also discussed. Pt is also encouraged to set specific goals in number of cigarettes used daily, as well as to set a quit date.   Insomnia Slight improvement but still uncontrolled, inc trazodone to 100mg  at bedtime  Anxiety and depression Uncontrolled ongoing anxiey, trial of  buspar, bhavior modification discussed  Hyperlipidemia Controlled, no change in medication Hyperlipidemia:Low fat diet discussed and encouraged.  Increased exercise encouraged  Gingivitis Antibiotic course prescribed, attention to dental care is discussed  HTN (hypertension) Controlled, no change in medication   Pain in joint, shoulder region Left shoulder pain with marked reduced ROM, xray of affected joint   Wrist pain, right X ray right wrist, thoiugh appears to be tenderness over ulnar tendon more so than joint pain

## 2013-06-22 NOTE — Assessment & Plan Note (Signed)
Left shoulder pain with marked reduced ROM, xray of affected joint

## 2013-06-22 NOTE — Assessment & Plan Note (Signed)
Uncontrolled neck pain radiating to both shoulders and upper arms , left more than right, tingling and shocking pains in upper arms, suggestive of nerve impingement, needs MRI , reports not much relief with gabapentin, may benefit from epidural injections

## 2013-06-22 NOTE — Assessment & Plan Note (Signed)
Controlled, no change in medication  

## 2013-06-22 NOTE — Assessment & Plan Note (Signed)
Uncontrolled pain which is debilitating, has established stenosis and disc disease from prior studies , needs re imaging

## 2013-06-22 NOTE — Assessment & Plan Note (Signed)
Antibiotic course prescribed, attention to dental care is discussed

## 2013-06-22 NOTE — Assessment & Plan Note (Signed)
Controlled, no change in medication Hyperlipidemia:Low fat diet discussed and encouraged.  Increased exercise encouraged

## 2013-06-22 NOTE — Assessment & Plan Note (Signed)
Unchanged and states she is stressed by spouse, unwilling to committing to quitting at this time Patient counseled for approximately 5 minutes regarding the health risks of ongoing nicotine use, specifically all types of cancer, heart disease, stroke and respiratory failure. The options available for help with cessation ,the behavioral changes to assist the process, and the option to either gradully reduce usage  Or abruptly stop.is also discussed. Pt is also encouraged to set specific goals in number of cigarettes used daily, as well as to set a quit date.

## 2013-06-27 ENCOUNTER — Telehealth: Payer: Self-pay | Admitting: *Deleted

## 2013-06-27 ENCOUNTER — Ambulatory Visit (HOSPITAL_COMMUNITY)
Admission: RE | Admit: 2013-06-27 | Discharge: 2013-06-27 | Disposition: A | Discharge status: Home / Self Care | Payer: Medicare HMO | Source: Ambulatory Visit | Attending: Family Medicine | Admitting: Family Medicine

## 2013-06-27 ENCOUNTER — Ambulatory Visit (HOSPITAL_COMMUNITY): Payer: Medicare HMO

## 2013-06-27 DIAGNOSIS — M5380 Other specified dorsopathies, site unspecified: Secondary | ICD-10-CM | POA: Insufficient documentation

## 2013-06-27 DIAGNOSIS — M545 Low back pain, unspecified: Secondary | ICD-10-CM | POA: Insufficient documentation

## 2013-06-27 DIAGNOSIS — M5137 Other intervertebral disc degeneration, lumbosacral region: Secondary | ICD-10-CM | POA: Insufficient documentation

## 2013-06-27 DIAGNOSIS — M48061 Spinal stenosis, lumbar region without neurogenic claudication: Secondary | ICD-10-CM | POA: Insufficient documentation

## 2013-06-27 DIAGNOSIS — M549 Dorsalgia, unspecified: Secondary | ICD-10-CM

## 2013-06-27 DIAGNOSIS — M51379 Other intervertebral disc degeneration, lumbosacral region without mention of lumbar back pain or lower extremity pain: Secondary | ICD-10-CM | POA: Insufficient documentation

## 2013-06-27 NOTE — Telephone Encounter (Addendum)
Kelly Douglas called stating pt has been approved for lubar spine but not the C-spine. Pt is aware that she is only getting the lubar spine today.

## 2013-06-28 NOTE — Addendum Note (Signed)
Addended by: Denman George B on: 06/28/2013 02:20 PM   Modules accepted: Orders

## 2013-07-02 ENCOUNTER — Telehealth: Payer: Self-pay | Admitting: Family Medicine

## 2013-07-02 DIAGNOSIS — M549 Dorsalgia, unspecified: Secondary | ICD-10-CM

## 2013-07-02 NOTE — Telephone Encounter (Signed)
Do you know of any other options?

## 2013-07-02 NOTE — Telephone Encounter (Signed)
pls refer to [facility], Dr Merlene Laughter or Dr Lyla Son whichever she wants whichever  uses her insurance

## 2013-07-03 ENCOUNTER — Ambulatory Visit (HOSPITAL_COMMUNITY)
Admission: RE | Admit: 2013-07-03 | Discharge: 2013-07-03 | Disposition: A | Discharge status: Home / Self Care | Payer: Medicare HMO | Source: Ambulatory Visit | Attending: Family Medicine | Admitting: Family Medicine

## 2013-07-03 DIAGNOSIS — M4802 Spinal stenosis, cervical region: Secondary | ICD-10-CM | POA: Insufficient documentation

## 2013-07-03 DIAGNOSIS — M47812 Spondylosis without myelopathy or radiculopathy, cervical region: Secondary | ICD-10-CM | POA: Insufficient documentation

## 2013-07-03 DIAGNOSIS — M5030 Other cervical disc degeneration, unspecified cervical region: Secondary | ICD-10-CM | POA: Insufficient documentation

## 2013-07-03 DIAGNOSIS — M5380 Other specified dorsopathies, site unspecified: Secondary | ICD-10-CM | POA: Insufficient documentation

## 2013-07-03 DIAGNOSIS — M542 Cervicalgia: Secondary | ICD-10-CM | POA: Insufficient documentation

## 2013-07-03 DIAGNOSIS — R209 Unspecified disturbances of skin sensation: Secondary | ICD-10-CM | POA: Insufficient documentation

## 2013-07-03 DIAGNOSIS — K148 Other diseases of tongue: Secondary | ICD-10-CM | POA: Insufficient documentation

## 2013-07-03 DIAGNOSIS — M25519 Pain in unspecified shoulder: Secondary | ICD-10-CM | POA: Insufficient documentation

## 2013-07-03 NOTE — Addendum Note (Signed)
Addended by: Denman George B on: 07/03/2013 09:49 AM   Modules accepted: Orders

## 2013-07-03 NOTE — Telephone Encounter (Signed)
Patient decided that she would like stay in town.  She is asking to be referred to Dr. Merlene Laughter.  Referral entered.

## 2013-07-06 ENCOUNTER — Other Ambulatory Visit: Payer: Self-pay

## 2013-07-06 DIAGNOSIS — R197 Diarrhea, unspecified: Secondary | ICD-10-CM

## 2013-07-10 ENCOUNTER — Other Ambulatory Visit: Payer: Self-pay

## 2013-07-10 DIAGNOSIS — M542 Cervicalgia: Secondary | ICD-10-CM

## 2013-07-23 LAB — CBC WITH DIFFERENTIAL/PLATELET
Basophils Absolute: 0 10*3/uL (ref 0.0–0.1)
Basophils Relative: 0 % (ref 0–1)
Eosinophils Absolute: 0.1 10*3/uL (ref 0.0–0.7)
Eosinophils Relative: 1 % (ref 0–5)
HCT: 36.2 % (ref 36.0–46.0)
Hemoglobin: 12.2 g/dL (ref 12.0–15.0)
LYMPHS PCT: 48 % — AB (ref 12–46)
Lymphs Abs: 4.9 10*3/uL — ABNORMAL HIGH (ref 0.7–4.0)
MCH: 25.3 pg — ABNORMAL LOW (ref 26.0–34.0)
MCHC: 33.7 g/dL (ref 30.0–36.0)
MCV: 74.9 fL — AB (ref 78.0–100.0)
Monocytes Absolute: 0.7 10*3/uL (ref 0.1–1.0)
Monocytes Relative: 7 % (ref 3–12)
Neutro Abs: 4.5 10*3/uL (ref 1.7–7.7)
Neutrophils Relative %: 44 % (ref 43–77)
PLATELETS: 296 10*3/uL (ref 150–400)
RBC: 4.83 MIL/uL (ref 3.87–5.11)
RDW: 16.4 % — AB (ref 11.5–15.5)
WBC: 10.2 10*3/uL (ref 4.0–10.5)

## 2013-07-26 ENCOUNTER — Other Ambulatory Visit: Payer: Self-pay | Admitting: Family Medicine

## 2013-07-30 ENCOUNTER — Ambulatory Visit (INDEPENDENT_AMBULATORY_CARE_PROVIDER_SITE_OTHER): Payer: Medicare HMO | Admitting: Internal Medicine

## 2013-07-30 ENCOUNTER — Encounter (INDEPENDENT_AMBULATORY_CARE_PROVIDER_SITE_OTHER): Payer: Self-pay

## 2013-07-30 ENCOUNTER — Encounter: Payer: Self-pay | Admitting: Internal Medicine

## 2013-07-30 VITALS — BP 126/79 | HR 78 | Temp 98.4°F | Resp 18 | Ht 69.0 in | Wt 168.0 lb

## 2013-07-30 DIAGNOSIS — E739 Lactose intolerance, unspecified: Secondary | ICD-10-CM

## 2013-07-30 DIAGNOSIS — Z86010 Personal history of colonic polyps: Secondary | ICD-10-CM

## 2013-07-30 DIAGNOSIS — K219 Gastro-esophageal reflux disease without esophagitis: Secondary | ICD-10-CM

## 2013-07-30 NOTE — Patient Instructions (Signed)
Bentyl as needed for loose stools  Continue Protonix daily for GERD  Office visit in 1 year  Repeat colonoscopy in 2022

## 2013-07-30 NOTE — Progress Notes (Signed)
Primary Care Physician:  Tula Nakayama, MD Primary Gastroenterologist:  Dr. Gala Romney  Pre-Procedure History & Physical: HPI:  Kelly Douglas is a 58 y.o. female here for followup of GERD and diarrhea. She has kept a stool diary which was reviewed.. The most part she has bristol 4 and 5 stools. Occasionally has watery diarrhea. Usually related to consumption of milk. No abdominal pain or rectal bleeding. GERD symptoms well controlled on Protonix 40 mg daily. History of colonic adenoma removed last year. She is slated for followup colonoscopy 2022.  Past Medical History  Diagnosis Date  . GERD (gastroesophageal reflux disease)     Dr Gala Romney EGD 09/2009->esophagitis, sm HH, antral erosions, atonic esophagus  . Seasonal allergies   . Arthritis   . Scleroderma   . Chronic lung disease     Chronic scarring and volume loss-left lung; characteristics of a chronic infectious process-possible MAI  . Chronic back pain   . Hypothyroid 1981 approx  . Arteriosclerotic cardiovascular disease (ASCVD) 2013    coronary calcification-left main, LAD and CX; small pericardial effusion  . Tobacco abuse 06/24/2009  . Syncope     Multiple spells over the past 40+ years, likely neurocardiogenic  . Hyperlipidemia   . Depression   . Anxiety   . Chronic bronchitis     Past Surgical History  Procedure Laterality Date  . Tubal ligation    . Bilateral salpingoophorectomy  2013     Dr. Elonda Husky; uterus remains in situ  . Laparoscopic appendectomy  07/16/2011    Procedure: APPENDECTOMY LAPAROSCOPIC;  Surgeon: Donato Heinz, MD;  Location: [facility];  Service: General;  Laterality: N/A;  . Colonoscopy  09/28/09    anal papilla otherwise normal  . Esophagogastroduodenoscopy  10/07/09    Dr. Barrie Dunker of esophageal mucosa, diffusely ?esophagitis (bx benign), small HH/antral erosions, erythema bx benign. 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 Esophagogastroduodenoscopy (egd) with esophageal dilation N/A  03/07/2012    LTR:VUYEBXID, patent, tubular esophagus of uncertain significance-status post biopsy )unremarkable). Hiatal hernia  . Colonoscopy N/A 08/29/2012    HWY:SHUOHFG polyp-removed as described above. tubular adenoma    Prior to Admission medications   Medication Sig Start Date End Date Taking? Authorizing Provider  acetaminophen (TYLENOL) 500 MG tablet Take 1,000 mg by mouth every 6 (six) hours as needed for pain.   Yes Historical Provider, MD  albuterol (PROVENTIL HFA;VENTOLIN HFA) 108 (90 BASE) MCG/ACT inhaler Inhale 2 puffs into the lungs every 6 (six) hours as needed. Shortness of breath 08/02/12  Yes [name], MD  amLODipine (NORVASC) 5 MG tablet TAKE 1 TABLET BY MOUTH EVERY DAY 06/12/13  Yes [name], MD  aspirin (ASPIRIN EC) 81 MG EC tablet Take 81 mg by mouth daily. Swallow whole.   Yes Historical Provider, MD  busPIRone (BUSPAR) 5 MG tablet Take 1 tablet (5 mg total) by mouth 3 (three) times daily. 06/21/13  Yes [name], MD  cephALEXin (KEFLEX) 500 MG capsule Take 1 capsule (500 mg total) by mouth 3 (three) times daily. 06/21/13  Yes [name], MD  dicyclomine (BENTYL) 10 MG capsule Take 1 capsule (10 mg total) by mouth as needed (for diarrhea and abdominal cramps. Do not exceed four capsules daily.). 02/07/13  Yes Mahala Menghini, PA-C  ferrous sulfate 325 (65 FE) MG EC tablet Take 1 tablet (325 mg total) by mouth 2 (two) times daily. 01/29/13  Yes Mahala Menghini, PA-C  gabapentin (NEURONTIN) 300 MG capsule TAKE 1  CAPSULE BY MOUTH THREE TIMES DAILY 03/21/13  Yes [name], MD  levothyroxine (SYNTHROID, LEVOTHROID) 75 MCG tablet TAKE 1 TABLET BY MOUTH EVERY DAY 06/21/13  Yes [name], MD  NON FORMULARY Pt to start Keflex for gum infection. She does not know the strength.   Yes Historical Provider, MD  pantoprazole (PROTONIX) 40 MG tablet TAKE 1 TABLET BY MOUTH DAILY 03/06/13  Yes [name], MD  pravastatin (PRAVACHOL) 40 MG  tablet TAKE 1 TABLET BY MOUTH EVERY DAY 11/01/12  Yes [name], MD  traZODone (DESYREL) 100 MG tablet Take 1 tablet (100 mg total) by mouth at bedtime. 06/21/13  Yes [name], MD  traZODone (DESYREL) 50 MG tablet TAKE 1 TABLET BY MOUTH EVERY NIGHT AT BEDTIME FOR SLEEP 07/26/13  Yes [name], MD    Allergies as of 07/30/2013  . (No Known Allergies)    Family History  Problem Relation Age of Onset  . Throat cancer Brother   . Stroke Father   . Coronary artery disease Brother   . Down syndrome Brother   . Hypertension Brother     father/mother  . Anesthesia problems Neg Hx   . Hypotension Neg Hx   . Malignant hyperthermia Neg Hx   . Pseudochol deficiency Neg Hx   . Colon cancer Neg Hx     History   Social History  . Marital Status: Married    Spouse Name: N/A    Number of Children: 2  . Years of Education: N/A   Occupational History  . disabled    Social History Main Topics  . Smoking status: Current Every Day Smoker -- 1.00 packs/day for 40 years    Types: Cigarettes  . Smokeless tobacco: Not on file     Comment: Smokes about a pack of cigarettes a day  . Alcohol Use: No     Comment: quit June 2012-used to drink 6 pack daily  . Drug Use: No  . Sexual Activity: Yes    Birth Control/ Protection: Post-menopausal   Other Topics Concern  . Not on file   Social History Narrative  . No narrative on file    Review of Systems: See HPI, otherwise negative ROS  Physical Exam: BP 126/79  Pulse 78  Temp(Src) 98.4 F (36.9 C) (Oral)  Resp 18  Ht 5\' 9"  (1.753 m)  Wt 168 lb (76.204 kg)  BMI 24.80 kg/m2 General:    pleasant and cooperative in NAD Eyes:  Sclera clear, no icterus.   Conjunctiva pink. Ears:  Normal auditory acuity. Nose:  No deformity, discharge,  or lesions. Mouth:  No deformity or lesions. Neck:  Supple; no masses or thyromegaly. No significant cervical adenopathy. Lungs:  Clear throughout to auscultation.   No wheezes,  crackles, or rhonchi. No acute distress. Heart:  Regular rate and rhythm; no murmurs, clicks, rubs,  or gallops. Abdomen: Non-distended, normal bowel sounds.  Soft and nontender without appreciable mass or hepatosplenomegaly.  Pulses:  Normal pulses noted. Extremities:  Without clubbing or edema.  Impression:  GERD well-controlled on Protonix daily. Intermittent diarrhea more likely related to lactose intolerance and anything else. History of colonic adenoma  Recommendations:   Bentyl as needed for loose stools.  Limit lactose pain foods.  Continue Protonix daily for GERD  Office visit in 1 year  Repeat colonoscopy in 2022    Notice: This dictation was prepared with Dragon dictation along with smaller phrase technology. Any transcriptional errors that result  from this process are unintentional and may not be corrected upon review.

## 2013-08-12 ENCOUNTER — Encounter: Payer: Medicare HMO | Admitting: Cardiology

## 2013-08-12 NOTE — Progress Notes (Signed)
     Clinical Summary ERROR Cancelled Appointment   Arnoldo Lenis, M.D., F.A.C.C.

## 2013-08-21 ENCOUNTER — Ambulatory Visit: Payer: Medicare HMO | Admitting: Family Medicine

## 2013-08-23 ENCOUNTER — Telehealth: Payer: Self-pay

## 2013-08-23 NOTE — Telephone Encounter (Signed)
I  Am willing to prescribe hydrocodone for 3 0 day supply only, she may collect the script on 08/10, no need to provide a script today as this represents a change of plan , and she is not on a pain contract . Pls ensure she understands that further mx is through pain clinic Script will be available on 8/10 for collection

## 2013-08-26 ENCOUNTER — Other Ambulatory Visit: Payer: Self-pay | Admitting: Family Medicine

## 2013-08-26 MED ORDER — HYDROCODONE-ACETAMINOPHEN 5-325 MG PO TABS: ORAL_TABLET | ORAL | Status: AC

## 2013-08-26 NOTE — Telephone Encounter (Signed)
Script printed for 30 day supply

## 2013-08-26 NOTE — Telephone Encounter (Signed)
Patient aware.  Please print rx.

## 2013-09-02 ENCOUNTER — Telehealth: Payer: Self-pay | Admitting: Cardiology

## 2013-09-02 MED ORDER — PRAVASTATIN SODIUM 40 MG PO TABS: ORAL_TABLET | ORAL | Status: DC

## 2013-09-02 NOTE — Telephone Encounter (Signed)
Received fax refill request  Rx # [id number] Medication:  Pravastatin 40 mg tablets Qty 30 Sig:  Take one tbalet by mouth every day Physician:  Lattie Haw

## 2013-09-03 ENCOUNTER — Other Ambulatory Visit: Payer: Self-pay | Admitting: Family Medicine

## 2013-09-16 ENCOUNTER — Ambulatory Visit (INDEPENDENT_AMBULATORY_CARE_PROVIDER_SITE_OTHER): Payer: Medicare HMO | Admitting: Family Medicine

## 2013-09-16 ENCOUNTER — Other Ambulatory Visit: Payer: Self-pay | Admitting: Family Medicine

## 2013-09-16 ENCOUNTER — Encounter: Payer: Self-pay | Admitting: Family Medicine

## 2013-09-16 VITALS — BP 122/80 | HR 66 | Resp 18 | Ht 69.0 in | Wt 159.0 lb

## 2013-09-16 DIAGNOSIS — Z23 Encounter for immunization: Secondary | ICD-10-CM | POA: Insufficient documentation

## 2013-09-16 DIAGNOSIS — F17219 Nicotine dependence, cigarettes, with unspecified nicotine-induced disorders: Secondary | ICD-10-CM

## 2013-09-16 DIAGNOSIS — F32A Depression, unspecified: Secondary | ICD-10-CM

## 2013-09-16 DIAGNOSIS — F341 Dysthymic disorder: Secondary | ICD-10-CM

## 2013-09-16 DIAGNOSIS — F19988 Other psychoactive substance use, unspecified with other psychoactive substance-induced disorder: Secondary | ICD-10-CM

## 2013-09-16 DIAGNOSIS — F17218 Nicotine dependence, cigarettes, with other nicotine-induced disorders: Secondary | ICD-10-CM

## 2013-09-16 DIAGNOSIS — E038 Other specified hypothyroidism: Secondary | ICD-10-CM

## 2013-09-16 DIAGNOSIS — R490 Dysphonia: Secondary | ICD-10-CM

## 2013-09-16 DIAGNOSIS — R7301 Impaired fasting glucose: Secondary | ICD-10-CM

## 2013-09-16 DIAGNOSIS — F419 Anxiety disorder, unspecified: Secondary | ICD-10-CM

## 2013-09-16 DIAGNOSIS — F329 Major depressive disorder, single episode, unspecified: Secondary | ICD-10-CM

## 2013-09-16 DIAGNOSIS — E785 Hyperlipidemia, unspecified: Secondary | ICD-10-CM

## 2013-09-16 DIAGNOSIS — M349 Systemic sclerosis, unspecified: Secondary | ICD-10-CM

## 2013-09-16 DIAGNOSIS — M549 Dorsalgia, unspecified: Secondary | ICD-10-CM

## 2013-09-16 DIAGNOSIS — F1999 Other psychoactive substance use, unspecified with unspecified psychoactive substance-induced disorder: Secondary | ICD-10-CM

## 2013-09-16 DIAGNOSIS — J984 Other disorders of lung: Secondary | ICD-10-CM

## 2013-09-16 DIAGNOSIS — I10 Essential (primary) hypertension: Secondary | ICD-10-CM

## 2013-09-16 DIAGNOSIS — F17200 Nicotine dependence, unspecified, uncomplicated: Secondary | ICD-10-CM

## 2013-09-16 NOTE — Assessment & Plan Note (Signed)
Controlled, no change in medication  

## 2013-09-16 NOTE — Assessment & Plan Note (Signed)
Progressive and ongoing, refer to ENT for eval, ongoing nicotine use

## 2013-09-16 NOTE — Assessment & Plan Note (Signed)
Marked improvement in anxiety symptoms on buspar, she is to continue same

## 2013-09-16 NOTE — Assessment & Plan Note (Signed)
Has established diagnosis, has not seen rheumatology for years, referred for re eva;l and med management  Has been to Dr Charlestine Night and is referred back to him

## 2013-09-16 NOTE — Patient Instructions (Addendum)
Annual wellness in end Nov/ December, call if you need me before  Flu vaccine today  You are referred to ENT re hoarseness, and to Dr Charlestine Night re your scleroderma   Labs today lipid, cmp , hBA1C and TSH  Aim for 10 ciggs per day in October and keep cutting back you need to quit  Glad that you  Are doing better overall

## 2013-09-16 NOTE — Assessment & Plan Note (Signed)
Controlled, no change in medication Updated lab needed t.

## 2013-09-16 NOTE — Assessment & Plan Note (Signed)
Vaccine administered at visit.  

## 2013-09-16 NOTE — Assessment & Plan Note (Signed)
Updated chest scan to re evalk nodules and scarring is  pastdue, willschedule

## 2013-09-16 NOTE — Assessment & Plan Note (Signed)
Significant arthritic disease in spine, pt being seen by pain med

## 2013-09-17 DIAGNOSIS — F17200 Nicotine dependence, unspecified, uncomplicated: Secondary | ICD-10-CM | POA: Insufficient documentation

## 2013-09-17 LAB — LIPID PANEL
Cholesterol: 102 mg/dL (ref 0–200)
HDL: 32 mg/dL — AB (ref >39)
LDL Cholesterol: 51 mg/dL (ref 0–99)
Total CHOL/HDL Ratio: 3.2 Ratio
Triglycerides: 95 mg/dL (ref <150)
VLDL: 19 mg/dL (ref 0–40)

## 2013-09-17 LAB — COMPREHENSIVE METABOLIC PANEL
ALT: 14 U/L (ref 0–35)
AST: 18 U/L (ref 0–37)
Albumin: 4.6 g/dL (ref 3.5–5.2)
Alkaline Phosphatase: 104 U/L (ref 39–117)
BILIRUBIN TOTAL: 0.4 mg/dL (ref 0.2–1.2)
BUN: 3 mg/dL — ABNORMAL LOW (ref 6–23)
CO2: 28 mEq/L (ref 19–32)
Calcium: 9.9 mg/dL (ref 8.4–10.5)
Chloride: 102 mEq/L (ref 96–112)
Creat: 0.84 mg/dL (ref 0.50–1.10)
GLUCOSE: 89 mg/dL (ref 70–99)
Potassium: 3.9 mEq/L (ref 3.5–5.3)
SODIUM: 137 meq/L (ref 135–145)
TOTAL PROTEIN: 7.4 g/dL (ref 6.0–8.3)

## 2013-09-17 LAB — TSH: TSH: 0.274 u[IU]/mL — ABNORMAL LOW (ref 0.350–4.500)

## 2013-09-17 LAB — T3, FREE: T3, Free: 3.1 pg/mL (ref 2.3–4.2)

## 2013-09-17 LAB — HEMOGLOBIN A1C
HEMOGLOBIN A1C: 6.1 % — AB (ref <5.7)
Mean Plasma Glucose: 128 mg/dL — ABNORMAL HIGH (ref <117)

## 2013-09-17 LAB — T4, FREE: Free T4: 1.47 ng/dL (ref 0.80–1.80)

## 2013-09-17 NOTE — Progress Notes (Signed)
   Subjective:    Patient ID: Kelly Douglas, female    DOB: 16-Nov-1955, 58 y.o.   MRN: [id number]  HPI The PT is here for follow up and re-evaluation of chronic medical conditions, medication management and review of any available recent lab and radiology data.  Preventive health is updated, specifically  Cancer screening and Immunization.   Questions or concerns regarding consultations or procedures which the PT has had in the interim are  Addressed.Has seen pain managemnt and has another upcoming appt.  The PT denies any adverse reactions to current medications since the last visit. Doing much better as far as anxiety is concerned on buspar Request eval for chronic painless hoarseness which is worsening , and also to rheumatology with her dx of sclerodermas    Review of Systems See HPI Denies recent fever or chills. Denies sinus pressure, nasal congestion, ear pain or sore throat.Hoarse, painless Denies chest congestion, productive cough or wheezing. Denies chest pains, palpitations and leg swelling Denies abdominal pain, nausea, vomiting,diarrhea or constipation.   Denies dysuria, frequency, hesitancy or incontinence. C/o chronic  joint pain,  and limitation in mobility.Hopes that epidural will be benficial Denies headaches, seizures, numbness, or tingling. Denies uncontrolled  depression, anxiety or insomnia.Much improved on current meds Chronic skin lesions from her scleroderma        Objective:   Physical Exam BP 122/80  Pulse 66  Resp 18  Ht 5\' 9"  (1.753 m)  Wt 159 lb (72.122 kg)  BMI 23.47 kg/m2  SpO2 98% Patient alert and oriented and in no cardiopulmonary distress.  HEENT: No facial asymmetry, EOMI,   oropharynx pink and moist.  Neck supple no JVD, no mass.  Chest: Clear to auscultation bilaterally.Decreased though adequate air entry  CVS: S1, S2 no murmurs, no S3.Regular rate.  ABD: Soft non tender.   Ext: No edema  MS: Adequate though reduced  ROM  spine, shoulders, hips and knees.  Skin: Intact, no ulcerations, hypo pigmented lesion on right upper arm.  Psych: Good eye contact, normal affect. Memory intact not anxious or depressed appearing.  CNS: CN 2-12 intact, power,  normal throughout.no focal deficits noted.        Assessment & Plan:  Need for prophylactic vaccination and inoculation against influenza Vaccine administered at visit.   Anxiety and depression Marked improvement in anxiety symptoms on buspar, she is to continue same  Scleroderma Has established diagnosis, has not seen rheumatology for years, referred for re eva;l and med management  Has been to Dr Charlestine Night and is referred back to him  Chronic lung disease Updated chest scan to re evalk nodules and scarring is  pastdue, willschedule  Hypothyroid Controlled, no change in medication Updated lab needed t.   HTN (hypertension) Controlled, no change in medication   Back pain with radiation Significant arthritic disease in spine, pt being seen by pain med  Hoarseness, persistent Progressive and ongoing, refer to ENT for eval, ongoing nicotine use   Nicotine dependence Unchanged Patient counseled for approximately 5 minutes regarding the health risks of ongoing nicotine use, specifically all types of cancer, heart disease, stroke and respiratory failure. The options available for help with cessation ,the behavioral changes to assist the process, and the option to either gradully reduce usage  Or abruptly stop.is also discussed. Pt is also encouraged to set specific goals in number of cigarettes used daily, as well as to set a quit date.

## 2013-09-17 NOTE — Assessment & Plan Note (Signed)
Unchanged. Patient counseled for approximately 5 minutes regarding the health risks of ongoing nicotine use, specifically all types of cancer, heart disease, stroke and respiratory failure. The options available for help with cessation ,the behavioral changes to assist the process, and the option to either gradully reduce usage  Or abruptly stop.is also discussed. Pt is also encouraged to set specific goals in number of cigarettes used daily, as well as to set a quit date.  

## 2013-09-18 ENCOUNTER — Other Ambulatory Visit: Payer: Self-pay | Admitting: Family Medicine

## 2013-09-24 NOTE — Addendum Note (Signed)
Addended by: Eual Fines on: 09/24/2013 01:18 PM   Modules accepted: Orders

## 2013-10-02 ENCOUNTER — Telehealth: Payer: Self-pay | Admitting: Family Medicine

## 2013-10-02 ENCOUNTER — Other Ambulatory Visit: Payer: Self-pay | Admitting: Family Medicine

## 2013-10-03 NOTE — Telephone Encounter (Signed)
aware

## 2013-10-07 ENCOUNTER — Telehealth: Payer: Self-pay | Admitting: Family Medicine

## 2013-10-07 ENCOUNTER — Ambulatory Visit (HOSPITAL_COMMUNITY): Admission: RE | Admit: 2013-10-07 | Payer: Medicare HMO | Source: Ambulatory Visit

## 2013-10-07 NOTE — Telephone Encounter (Signed)
noted 

## 2013-10-10 ENCOUNTER — Ambulatory Visit (INDEPENDENT_AMBULATORY_CARE_PROVIDER_SITE_OTHER): Payer: Commercial Managed Care - HMO | Admitting: Otolaryngology

## 2013-10-10 ENCOUNTER — Ambulatory Visit (HOSPITAL_COMMUNITY)
Admission: RE | Admit: 2013-10-10 | Discharge: 2013-10-10 | Disposition: A | Discharge status: Home / Self Care | Payer: Medicare HMO | Source: Ambulatory Visit | Attending: Family Medicine | Admitting: Family Medicine

## 2013-10-10 DIAGNOSIS — F17219 Nicotine dependence, cigarettes, with unspecified nicotine-induced disorders: Secondary | ICD-10-CM

## 2013-10-10 DIAGNOSIS — K219 Gastro-esophageal reflux disease without esophagitis: Secondary | ICD-10-CM

## 2013-10-10 DIAGNOSIS — R0602 Shortness of breath: Secondary | ICD-10-CM | POA: Diagnosis present

## 2013-10-10 DIAGNOSIS — R911 Solitary pulmonary nodule: Secondary | ICD-10-CM | POA: Diagnosis not present

## 2013-10-10 DIAGNOSIS — J984 Other disorders of lung: Secondary | ICD-10-CM

## 2013-10-10 DIAGNOSIS — R490 Dysphonia: Secondary | ICD-10-CM

## 2013-10-12 ENCOUNTER — Other Ambulatory Visit: Payer: Self-pay | Admitting: Family Medicine

## 2013-10-24 ENCOUNTER — Other Ambulatory Visit: Payer: Self-pay

## 2013-10-24 MED ORDER — PANTOPRAZOLE SODIUM 40 MG PO TBEC: DELAYED_RELEASE_TABLET | ORAL | Status: DC

## 2013-10-24 MED ORDER — BUSPIRONE HCL 5 MG PO TABS: 5.0000 mg | ORAL_TABLET | Freq: Three times a day (TID) | ORAL | Status: DC

## 2013-10-24 MED ORDER — AMLODIPINE BESYLATE 5 MG PO TABS: ORAL_TABLET | ORAL | Status: DC

## 2013-10-24 MED ORDER — PRAVASTATIN SODIUM 40 MG PO TABS: ORAL_TABLET | ORAL | Status: DC

## 2013-10-24 MED ORDER — LEVOTHYROXINE SODIUM 75 MCG PO TABS: ORAL_TABLET | ORAL | Status: DC

## 2013-10-24 MED ORDER — TRAZODONE HCL 100 MG PO TABS: ORAL_TABLET | ORAL | Status: DC

## 2013-10-31 ENCOUNTER — Ambulatory Visit (HOSPITAL_COMMUNITY)
Admission: RE | Admit: 2013-10-31 | Discharge: 2013-10-31 | Disposition: A | Discharge status: Home / Self Care | Payer: Medicare HMO | Source: Ambulatory Visit | Attending: Neurology | Admitting: Neurology

## 2013-10-31 DIAGNOSIS — M545 Low back pain: Secondary | ICD-10-CM | POA: Diagnosis not present

## 2013-10-31 DIAGNOSIS — Z5189 Encounter for other specified aftercare: Secondary | ICD-10-CM | POA: Diagnosis present

## 2013-10-31 DIAGNOSIS — M25552 Pain in left hip: Secondary | ICD-10-CM | POA: Diagnosis not present

## 2013-10-31 DIAGNOSIS — Z7409 Other reduced mobility: Secondary | ICD-10-CM

## 2013-10-31 DIAGNOSIS — M2560 Stiffness of unspecified joint, not elsewhere classified: Secondary | ICD-10-CM | POA: Insufficient documentation

## 2013-10-31 NOTE — Evaluation (Signed)
Physical Therapy Evaluation  Patient Details  Name: Kelly Douglas MRN: [id number] Date of Birth: 02-13-1955  Today's Date: 10/31/2013 Time: 3557-3220 PT Time Calculation (min): 48 min Charge:  Evaluation 2542-7062; traction 3762-8315              Visit#: 1 of 8  Re-eval: 11/30/13 Assessment Diagnosis: HNP   Authorization: [employer] medicare        Past Medical History:  Past Medical History  Diagnosis Date  . GERD (gastroesophageal reflux disease)     Dr Gala Romney EGD 09/2009->esophagitis, sm HH, antral erosions, atonic esophagus  . Seasonal allergies   . Arthritis   . Scleroderma   . Chronic lung disease     Chronic scarring and volume loss-left lung; characteristics of a chronic infectious process-possible MAI  . Chronic back pain   . Hypothyroid 1981 approx  . Arteriosclerotic cardiovascular disease (ASCVD) 2013    coronary calcification-left main, LAD and CX; small pericardial effusion  . Tobacco abuse 06/24/2009  . Syncope     Multiple spells over the past 40+ years, likely neurocardiogenic  . Hyperlipidemia   . Depression   . Anxiety   . Chronic bronchitis    Past Surgical History:  Past Surgical History  Procedure Laterality Date  . Tubal ligation    . Bilateral salpingoophorectomy  2013     Dr. Elonda Husky; uterus remains in situ  . Laparoscopic appendectomy  07/16/2011    Procedure: APPENDECTOMY LAPAROSCOPIC;  Surgeon: Donato Heinz, MD;  Location: [facility];  Service: General;  Laterality: N/A;  . Colonoscopy  09/28/09    anal papilla otherwise normal  . Esophagogastroduodenoscopy  10/07/09    Dr. Barrie Dunker of esophageal mucosa, diffusely ?esophagitis (bx benign), small HH/antral erosions, erythema bx benign. 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 Esophagogastroduodenoscopy (egd) with esophageal dilation N/A 03/07/2012    VVO:HYWVPXTG, patent, tubular esophagus of uncertain significance-status post biopsy )unremarkable). Hiatal hernia  . Colonoscopy N/A  08/29/2012    GYI:RSWNIOE polyp-removed as described above. tubular adenoma    Subjective Symptoms/Limitations Symptoms: [name] states that her back has been bothering her for years.  She normally just takes pain medication.  The patient states that the pain stays in her hips.  She has been referred to PT for a trial of traction.  How long can you sit comfortably?: Able to sit for 10-64minutes  How long can you stand comfortably?: 10-15 minutes  How long can you walk comfortably?: Pt states that walking causes her the most painful thing and she is able to walk for ten minutes  Special Tests: MRI moderate disc L3-L4; L5-S1  Pain Assessment Currently in Pain?: Yes Pain Score: 6  Pain Location: Back Pain Orientation: Lower Pain Type: Chronic pain     Sensation/Coordination/Flexibility/Functional Tests Functional Tests Functional Tests: foto 40  Assessment RLE Strength Right Hip Extension: 3/5 Right Knee Flexion: 3+/5 LLE Strength Left Hip Extension: 3/5 Left Knee Flexion: 3+/5 Lumbar AROM Lumbar Flexion: NT Lumbar Extension: decreased 15% Lumbar - Right Side Bend: wnl Lumbar - Left Side Bend: wnl Lumbar - Right Rotation: decresed 20% Lumbar - Left Rotation: decreased 20%  Exercise/Treatments      Stretches Active Hamstring Stretch: 2 reps;30 seconds Standing Extension: 5 reps Prone on Elbows Stretch: 5 reps Press Ups: 1 rep Standing Functional Squats: 5 reps     Modalities Modalities: Traction Traction Type of Traction: Lumbar Max (lbs): 60 Hold Time: static x 15 minutes  Physical Therapy Assessment and Plan PT Assessment and Plan  Clinical Impression Statement: Pt has had chronic LBP secondary to HNP and is referred by her MD for therapy including a trial of traction.  Pt was placed on 60# of traction and tolerated well while she was in the traction.  Once she went to stand up, however, the patient stated that she had severe pain in her lumbar area.   This pain eased up after a minutte. Pt evaluation demonstrated decreased ROM and strength, increase pain and will benefit from skilled PT to  Address these issue and return pt to maximal functional ability.   Rehab Potential: Fair PT Frequency: Min 2X/week PT Duration: 4 weeks PT Treatment/Interventions: Therapeutic activities;Therapeutic exercise;Manual techniques;Modalities PT Plan: Assess how pt did with traction last time.  If she had no problems following continue traction on 60#, begin with posture, body mechanics and gluteal strengthening exerciss.     Goals Home Exercise Program Pt/caregiver will Perform Home Exercise Program: For increased ROM PT Goal: Perform Home Exercise Program - Progress: Goal set today PT Short Term Goals Time to Complete Short Term Goals: 2 weeks PT Short Term Goal 1: Pt Pain to be decreased to no greater than a 5/10  PT Short Term Goal 2: Pt to be able to sit for 15 minutes in comfort to be able to eat a small meal  PT Short Term Goal 3: Pt to be able to walk for 15 minutes without increased pain to be able to complete shotr shopping trips  PT Short Term Goal 4: Hip extensor strengtth 4/5 to allow pt to come sit to stand I  PT Short Term Goal 5: Pt to be able to verbalize the importance of posture in care of the back.  PT Long Term Goals Time to Complete Long Term Goals: 4 weeks PT Long Term Goal 1: I in advance HEP PT Long Term Goal 2: Pt to be able to sit for 30 minutes without pain to be able to watch a tv show in comfort  Long Term Goal 3: Pt to be walking for 20-30- mintues 4 x a week for better health habits.   Problem List Patient Active Problem List   Diagnosis Date Noted  . Stiffness due to immobility 10/31/2013  . Nicotine dependence 09/17/2013  . Hoarseness, persistent 09/16/2013  . Need for prophylactic vaccination and inoculation against influenza 09/16/2013  . Pain in joint, shoulder region 06/22/2013  . Wrist pain, right 06/22/2013  .  Cervical pain (neck) 06/21/2013  . Back pain with radiation 06/21/2013  . Back pain 04/03/2013  . Right sided abdominal pain 08/10/2012  . Heme positive stool 08/10/2012  . IDA (iron deficiency anemia) 08/10/2012  . Left-sided epistaxis 06/01/2012  . Leg cramps 04/25/2012  . Diarrhea 04/25/2012  . Microcytosis 04/25/2012  . Globus sensation 02/28/2012  . HTN (hypertension) 02/22/2012  . Cardiorenal syndrome 02/02/2012  . Insomnia 05/09/2011  . Laboratory test 05/05/2011  . GERD (gastroesophageal reflux disease)   . Scleroderma   . Chronic lung disease   . Hypothyroid   . Arteriosclerotic cardiovascular disease (ASCVD)   . Syncope   . Anxiety and depression 06/24/2009  . Tobacco abuse 06/24/2009  . Chronic back pain 06/24/2009  . VITAMIN D DEFICIENCY 03/18/2009  . Hyperlipidemia 03/18/2009    PT Plan of Care PT Home Exercise Plan: given  GP Functional Assessment Tool Used: foto Functional Limitation: Other PT primary Other PT Primary Current Status (C7893): At least 60 percent but less than 80 percent impaired, limited or restricted Other  PT Primary Goal Status [phone]): At least 40 percent but less than 60 percent impaired, limited or restricted  Loriel Diehl,CINDY 10/31/2013, 4:59 PM  Physician Documentation Your signature is required to indicate approval of the treatment plan as stated above.  Please sign and either send electronically or make a copy of this report for your files and return this physician signed original.   Please mark one 1.__approve of plan  2. ___approve of plan with the following conditions.   ______________________________                                                          _____________________ Physician Signature                                                                                                             Date

## 2013-11-06 ENCOUNTER — Ambulatory Visit (HOSPITAL_COMMUNITY)
Admission: RE | Admit: 2013-11-06 | Discharge: 2013-11-06 | Disposition: A | Discharge status: Home / Self Care | Payer: Medicare HMO | Source: Ambulatory Visit | Attending: Neurology | Admitting: Neurology

## 2013-11-06 DIAGNOSIS — Z5189 Encounter for other specified aftercare: Secondary | ICD-10-CM | POA: Diagnosis not present

## 2013-11-06 NOTE — Progress Notes (Signed)
Physical Therapy Treatment Patient Details  Name: Kelly Douglas MRN: [id number] Date of Birth: 07-16-1955  Today's Date: 11/06/2013 Time: 2947-6546 PT Time Calculation (min): 52 min 32' TE 15' traction  Visit#: 2 of 8  Re-eval: 11/30/13    Authorization: [employer]  Authorization Time Period:    Authorization Visit#:   of     Subjective: Symptoms/Limitations Symptoms: pt reports 7/10 lowback back no radiating symptoms     Exercise/Treatments   Stretches Active Hamstring Stretch: 2 reps;30 seconds    Standing Functional Squats: 10 reps Other Standing Lumbar Exercises: hip ext x10 B Other Standing Lumbar Exercises: squats x 5 at chair with yellow ball for proper body mechanics    Supine Ab Set: 10 reps;5 seconds Bridge: 10 reps;5 reps  Modalities Modalities: Traction Traction Type of Traction: Lumbar Max (lbs): 60 Hold Time: static x 15 minutes  Physical Therapy Assessment and Plan PT Assessment and Plan Clinical Impression Statement: Patient states 7/10 LBP today is improvement as she is usually at 10/10.Added standing and supine strengthening/stabiilzation TE;pt states she performs daily HEP.  Traction last week brought relief so was applied again today at 60#, although both times at completion of traction pt has catching pain until she can "walk it out"; after rest add GS/hip add squeeze before sitting up  to set muscles. Patient reports decrease in LBP at end of session 5/10 PT Plan: Continue traction ,  posture, body mechanics and gluteal strengthening exerciss.     Goals    Problem List Patient Active Problem List   Diagnosis Date Noted  . Stiffness due to immobility 10/31/2013  . Nicotine dependence 09/17/2013  . Hoarseness, persistent 09/16/2013  . Need for prophylactic vaccination and inoculation against influenza 09/16/2013  . Pain in joint, shoulder region 06/22/2013  . Wrist pain, right 06/22/2013  . Cervical pain (neck) 06/21/2013  .  Back pain with radiation 06/21/2013  . Back pain 04/03/2013  . Right sided abdominal pain 08/10/2012  . Heme positive stool 08/10/2012  . IDA (iron deficiency anemia) 08/10/2012  . Left-sided epistaxis 06/01/2012  . Leg cramps 04/25/2012  . Diarrhea 04/25/2012  . Microcytosis 04/25/2012  . Globus sensation 02/28/2012  . HTN (hypertension) 02/22/2012  . Cardiorenal syndrome 02/02/2012  . Insomnia 05/09/2011  . Laboratory test 05/05/2011  . GERD (gastroesophageal reflux disease)   . Scleroderma   . Chronic lung disease   . Hypothyroid   . Arteriosclerotic cardiovascular disease (ASCVD)   . Syncope   . Anxiety and depression 06/24/2009  . Tobacco abuse 06/24/2009  . Chronic back pain 06/24/2009  . VITAMIN D DEFICIENCY 03/18/2009  . Hyperlipidemia 03/18/2009    PT - End of Session Activity Tolerance: Patient tolerated treatment well  GP    [name], [place] 11/06/2013, 3:38 PM

## 2013-11-11 ENCOUNTER — Encounter: Payer: Self-pay | Admitting: Family Medicine

## 2013-11-11 DIAGNOSIS — Z0000 Encounter for general adult medical examination without abnormal findings: Secondary | ICD-10-CM | POA: Insufficient documentation

## 2013-11-11 DIAGNOSIS — Z1211 Encounter for screening for malignant neoplasm of colon: Secondary | ICD-10-CM | POA: Insufficient documentation

## 2013-11-11 NOTE — Assessment & Plan Note (Signed)

## 2013-11-11 NOTE — Assessment & Plan Note (Signed)
Sleep hygiene reviewed and written information offered also. Prescription sent for  medication needed.  

## 2013-11-11 NOTE — Assessment & Plan Note (Signed)
Rectal exam as documented, stool is heme negative

## 2013-11-11 NOTE — Assessment & Plan Note (Signed)
Uncontrolled , has severe stenosis, tramadol prescrinbed

## 2013-11-14 ENCOUNTER — Ambulatory Visit (HOSPITAL_COMMUNITY)
Admission: RE | Admit: 2013-11-14 | Discharge: 2013-11-14 | Disposition: A | Discharge status: Home / Self Care | Payer: Medicare HMO | Source: Ambulatory Visit | Attending: Family Medicine | Admitting: Family Medicine

## 2013-11-14 DIAGNOSIS — Z5189 Encounter for other specified aftercare: Secondary | ICD-10-CM | POA: Diagnosis not present

## 2013-11-14 NOTE — Progress Notes (Addendum)
Physical Therapy Treatment Patient Details  Name: Kelly Douglas MRN: [id number] Date of Birth: 05/23/1955  Today's Date: 11/14/2013 Time: 4782-9562 PT Time Calculation (min): 41 min TE 1350-1420, Traction 1308-6578  Visit#: 3 of 8  Re-eval: 11/30/13 Assessment Diagnosis: HNP    Subjective: Symptoms/Limitations Symptoms: Pt reports decreased pain today to 5/10 on the Rt and Lt low back, not in the center of back.  No radicular symptoms.  Pt reports she feels the traction is helping decrease her pain.   Pain Assessment Currently in Pain?: Yes Pain Score: 5  Pain Location: Back Pain Orientation: Right;Left;Lower   Exercise/Treatments Stretches Active Hamstring Stretch: 2 reps;30 seconds;Limitations Active Hamstring Stretch Limitations: 14" Box Passive Hamstring Stretch: 3 reps;30 seconds;Limitations Passive Hamstring Stretch Limitations: Press photographer, Slantboard Press Ups: 3 reps;10 seconds (Prone on extended arms) Quad Stretch: 2 reps;30 seconds;Limitations Sports administrator Limitations: Prone with rope Piriformis Stretch: 2 reps;30 seconds;Limitations Piriformis Stretch Limitations: Supine with rope Standing Functional Squats: 15 reps Supine Ab Set: 5 seconds;10 reps Bridge: 3 seconds;10 reps Prone  Straight Leg Raise: 15 reps  Traction Type of Traction: Lumbar Max (lbs): 65 Hold Time: Static hold Time: 10 minutes  Physical Therapy Assessment and Plan PT Assessment and Plan Clinical Impression Statement: Pt reports her pain is decreasing, that her pain is no longer radiating down her legs and is centralized in the lower back more to the Rt and Lt sides today.  Pt was able to increase exercise program today, focusing on stretching and mobility exercises.  VC for controlled movement with the strengthening program to avoid decreased concentric/eccentric control.   Pt will benefit from skilled therapeutic intervention in order to improve on the following deficits:  Pain;Decreased strength PT Plan: Continue strengthening and stability program.  Assess radicular symptoms, if none present may want to hold on traction to increase theapeutic exercises.     Goals PT Short Term Goals PT Short Term Goal 1: Pt Pain to be decreased to no greater than a 5/10  PT Short Term Goal 1 - Progress: Progressing toward goal PT Short Term Goal 4: Hip extensor strengtth 4/5 to allow pt to come sit to stand I  PT Short Term Goal 4 - Progress: Progressing toward goal PT Short Term Goal 5: Pt to be able to verbalize the importance of posture in care of the back.  PT Short Term Goal 5 - Progress: Progressing toward goal  Problem List Patient Active Problem List   Diagnosis Date Noted  . Encounter for annual physical exam 11/11/2013  . Special screening for malignant neoplasms, colon 11/11/2013  . Stiffness due to immobility 10/31/2013  . Nicotine dependence 09/17/2013  . Hoarseness, persistent 09/16/2013  . Need for prophylactic vaccination and inoculation against influenza 09/16/2013  . Pain in joint, shoulder region 06/22/2013  . Wrist pain, right 06/22/2013  . Cervical pain (neck) 06/21/2013  . Back pain with radiation 06/21/2013  . Back pain 04/03/2013  . Right sided abdominal pain 08/10/2012  . Heme positive stool 08/10/2012  . IDA (iron deficiency anemia) 08/10/2012  . Left-sided epistaxis 06/01/2012  . Leg cramps 04/25/2012  . Diarrhea 04/25/2012  . Microcytosis 04/25/2012  . Globus sensation 02/28/2012  . HTN (hypertension) 02/22/2012  . Cardiorenal syndrome 02/02/2012  . Insomnia 05/09/2011  . Laboratory test 05/05/2011  . GERD (gastroesophageal reflux disease)   . Scleroderma   . Chronic lung disease   . Hypothyroid   . Arteriosclerotic cardiovascular disease (ASCVD)   . Syncope   . Anxiety  and depression 06/24/2009  . Tobacco abuse 06/24/2009  . Chronic back pain 06/24/2009  . VITAMIN D DEFICIENCY 03/18/2009  . Hyperlipidemia 03/18/2009     PT - End of Session Activity Tolerance: Patient tolerated treatment well General Behavior During Therapy: [facility] for tasks assessed/performed   Franny Selvage 11/14/2013, 2:46 PM

## 2013-11-18 ENCOUNTER — Encounter: Payer: Self-pay | Admitting: Family Medicine

## 2013-11-19 ENCOUNTER — Ambulatory Visit (HOSPITAL_COMMUNITY)
Admission: RE | Admit: 2013-11-19 | Discharge: 2013-11-19 | Disposition: A | Discharge status: Home / Self Care | Payer: Commercial Managed Care - HMO | Source: Ambulatory Visit | Attending: Family Medicine | Admitting: Family Medicine

## 2013-11-19 DIAGNOSIS — M545 Low back pain: Secondary | ICD-10-CM | POA: Insufficient documentation

## 2013-11-19 DIAGNOSIS — M5440 Lumbago with sciatica, unspecified side: Secondary | ICD-10-CM

## 2013-11-19 DIAGNOSIS — M25552 Pain in left hip: Secondary | ICD-10-CM | POA: Insufficient documentation

## 2013-11-19 DIAGNOSIS — Z5189 Encounter for other specified aftercare: Secondary | ICD-10-CM | POA: Insufficient documentation

## 2013-11-19 NOTE — Therapy (Signed)
Physical Therapy Treatment  Patient Details  Name: Kelly Douglas MRN: [id number] Date of Birth: 11/29/1955  Encounter Date: 11/19/2013      PT End of Session - 11/19/13 1513    PT Start Time 1435   PT Stop Time 1515   PT Time Calculation (min) 40 min   PT Charge Details TE [id number]   Activity Tolerance Patient tolerated treatment well      Past Medical History  Diagnosis Date  . GERD (gastroesophageal reflux disease)     Dr Gala Romney EGD 09/2009->esophagitis, sm HH, antral erosions, atonic esophagus  . Seasonal allergies   . Arthritis   . Scleroderma   . Chronic lung disease     Chronic scarring and volume loss-left lung; characteristics of a chronic infectious process-possible MAI  . Chronic back pain   . Hypothyroid 1981 approx  . Arteriosclerotic cardiovascular disease (ASCVD) 2013    coronary calcification-left main, LAD and CX; small pericardial effusion  . Tobacco abuse 06/24/2009  . Syncope     Multiple spells over the past 40+ years, likely neurocardiogenic  . Hyperlipidemia   . Depression   . Anxiety   . Chronic bronchitis     Past Surgical History  Procedure Laterality Date  . Tubal ligation    . Bilateral salpingoophorectomy  2013     Dr. Elonda Husky; uterus remains in situ  . Laparoscopic appendectomy  07/16/2011    Procedure: APPENDECTOMY LAPAROSCOPIC;  Surgeon: Donato Heinz, MD;  Location: [facility];  Service: General;  Laterality: N/A;  . Colonoscopy  09/28/09    anal papilla otherwise normal  . Esophagogastroduodenoscopy  10/07/09    Dr. Barrie Dunker of esophageal mucosa, diffusely ?esophagitis (bx benign), small HH/antral erosions, erythema bx benign. 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 Esophagogastroduodenoscopy (egd) with esophageal dilation N/A 03/07/2012    IOX:[id number], patent, tubular esophagus of uncertain significance-status post biopsy )unremarkable). Hiatal hernia  . Colonoscopy N/A 08/29/2012    EQA:[id number] polyp-removed as described  above. tubular adenoma    There were no vitals taken for this visit.  Visit Diagnosis:  Midline low back pain with sciatica, sciatica laterality unspecified          Adult PT Treatment/Exercise - 11/19/13 1510    Active Hamstring Stretch 3 reps;30 seconds   Active Hamstring Stretch Limitations 14" Box   Passive Hamstring Stretch 3 reps;30 seconds;Limitations   Passive Hamstring Stretch Limitations Gastroc Stretch, Slantboard   Press Ups 3 reps;10 seconds   Quad Stretch 2 reps;30 seconds;Limitations   Quad Stretch Limitations Prone with rope   Piriformis Stretch 3 reps;30 seconds   Piriformis Stretch Limitations Supine with rope   Other Standing Lumbar Exercises 3D Hip Excursion x10   Ab Set 5 seconds;10 reps   Bent Knee Raise 10 reps   Bridge 5 seconds;10 reps   Straight Leg Raise 10 reps   Straight Leg Raises Limitations with ab set   Hip Abduction 10 reps   Straight Leg Raise 20 reps            PT Short Term Goals - 11/19/13 1455    Title Pt will demonstrate increased hip extensor strength to 4/5 to allow pt to come to sit (I)   Time 2   Period Weeks   Status On-going   Title Pt will be able to verbalize the importance of posture in care of back.    Time 2   Period Weeks   Status On-going  PT Long Term Goals - 11/19/13 1456    Title Pt will be (I) with HEP   Time 4   Period Weeks   Status On-going   Title Pt will be able to sit for 30 minutes without pain to be able to watch a tv show in comfort.    Time 4   Period Weeks   Status On-going   Title Pt will be able to walk for 20-30 minutes 4x/wk for better health habits.    Time 4   Period Weeks   Status On-going          Plan - 11/19/13 1514    Clinical Impression Statement No complaints of pain prior to treatment today so traction withheld; assess next visit to determine is traction needs to be re-initiated. Progressed exercise program today, increasing reps and time to toelrance.   Noted weakness of Lt hip abductor > Rt hip abductor during  SLR ABD today.  Mild soreness erported with bridging exercise.    Pt will benefit from skilled therapeutic intervention in order to improve on the following deficits Pain;Decreased strength   Rehab Potential Fair   PT Frequency Min 2X/week   PT Duration 4 weeks   PT Treatment/Interventions Therapeutic activities;Therapeutic exercise;Manual techniques;Modalities   PT Plan Progress stability exercises, adding quadraped bird-dog for trunk stability as tolerated.         Problem List Patient Active Problem List   Diagnosis Date Noted  . Encounter for annual physical exam 11/11/2013  . Special screening for malignant neoplasms, colon 11/11/2013  . Stiffness due to immobility 10/31/2013  . Nicotine dependence 09/17/2013  . Hoarseness, persistent 09/16/2013  . Need for prophylactic vaccination and inoculation against influenza 09/16/2013  . Pain in joint, shoulder region 06/22/2013  . Wrist pain, right 06/22/2013  . Cervical pain (neck) 06/21/2013  . Back pain with radiation 06/21/2013  . Back pain 04/03/2013  . Right sided abdominal pain 08/10/2012  . Heme positive stool 08/10/2012  . IDA (iron deficiency anemia) 08/10/2012  . Left-sided epistaxis 06/01/2012  . Leg cramps 04/25/2012  . Diarrhea 04/25/2012  . Microcytosis 04/25/2012  . Globus sensation 02/28/2012  . HTN (hypertension) 02/22/2012  . Cardiorenal syndrome 02/02/2012  . Insomnia 05/09/2011  . Laboratory test 05/05/2011  . GERD (gastroesophageal reflux disease)   . Scleroderma   . Chronic lung disease   . Hypothyroid   . Arteriosclerotic cardiovascular disease (ASCVD)   . Syncope   . Anxiety and depression 06/24/2009  . Tobacco abuse 06/24/2009  . Chronic back pain 06/24/2009  . VITAMIN D DEFICIENCY 03/18/2009  . Hyperlipidemia 03/18/2009         Jahlani Lorentz 11/19/2013, 3:22 PM

## 2013-11-21 ENCOUNTER — Ambulatory Visit (HOSPITAL_COMMUNITY): Payer: Commercial Managed Care - HMO | Admitting: Physical Therapy

## 2013-11-26 ENCOUNTER — Ambulatory Visit (HOSPITAL_COMMUNITY)
Admission: RE | Admit: 2013-11-26 | Discharge: 2013-11-26 | Disposition: A | Discharge status: Home / Self Care | Payer: Commercial Managed Care - HMO | Source: Ambulatory Visit | Attending: Family Medicine | Admitting: Family Medicine

## 2013-11-26 DIAGNOSIS — Z7409 Other reduced mobility: Secondary | ICD-10-CM

## 2013-11-26 DIAGNOSIS — M2560 Stiffness of unspecified joint, not elsewhere classified: Secondary | ICD-10-CM

## 2013-11-26 DIAGNOSIS — Z5189 Encounter for other specified aftercare: Secondary | ICD-10-CM | POA: Diagnosis not present

## 2013-11-26 DIAGNOSIS — M5440 Lumbago with sciatica, unspecified side: Secondary | ICD-10-CM

## 2013-11-26 NOTE — Patient Instructions (Signed)
On Elbows (Prone)   Rise up on elbows as high as possible, keeping hips on floor. Hold ____ seconds. Repeat ____ times per set. Do ____ sets per session. Do ____ sessions per day.  http://orth.exer.us/92   Copyright  VHI. All rights reserved.  Press-Up   Press upper body upward, keeping hips in contact with floor. Keep lower back and buttocks relaxed. Hold ____ seconds. Repeat ____ times per set. Do ____ sets per session. Do ____ sessions per day.  http://orth.exer.us/94   Copyright  VHI. All rights reserved.  Bridging   Slowly raise buttocks from floor, keeping stomach tight. Repeat ____ times per set. Do ____ sets per session. Do ____ sessions per day.  http://orth.exer.us/1096   Copyright  VHI. All rights reserved.  Straight Leg Raise   Tighten stomach and slowly raise locked right leg ____ inches from floor. Repeat ____ times per set. Do ____ sets per session. Do ____ sessions per day.  http://orth.exer.us/1102   Copyright  VHI. All rights reserved.  Strengthening: Hip Abduction (Side-Lying)   Tighten muscles on front of left thigh, then lift leg ____ inches from surface, keeping knee locked.  Repeat ____ times per set. Do ____ sets per session. Do ____ sessions per day.  http://orth.exer.us/622   Copyright  VHI. All rights reserved.  Deep Squat   Stand with feet shoulder width apart and squat deeply, head and chest up. Repeat ____ times per set. Do ____ sets per session. Do ____ sessions per day.  http://orth.exer.us/738   Copyright  VHI. All rights reserved.

## 2013-11-26 NOTE — Therapy (Signed)
Physical Therapy Treatment  Patient Details  Name: Kelly Douglas MRN: [id number] Date of Birth: [birth year]  Encounter Date: 11/26/2013      PT End of Session - 11/26/13 1539    Visit Number 5   Number of Visits 8   Date for PT Re-Evaluation 11/30/13   Authorization Type [employer]   Authorization - Visit Number 5   Authorization - Number of Visits 8   PT Start Time 7412   PT Stop Time 1600   PT Time Calculation (min) 42 min   Activity Tolerance Patient tolerated treatment well      Past Medical History  Diagnosis Date  . GERD (gastroesophageal reflux disease)     Dr Gala Romney EGD 09/2009->esophagitis, sm HH, antral erosions, atonic esophagus  . Seasonal allergies   . Arthritis   . Scleroderma   . Chronic lung disease     Chronic scarring and volume loss-left lung; characteristics of a chronic infectious process-possible MAI  . Chronic back pain   . Hypothyroid 1981 approx  . Arteriosclerotic cardiovascular disease (ASCVD) 2013    coronary calcification-left main, LAD and CX; small pericardial effusion  . Tobacco abuse 06/24/2009  . Syncope     Multiple spells over the past 40+ years, likely neurocardiogenic  . Hyperlipidemia   . Depression   . Anxiety   . Chronic bronchitis     Past Surgical History  Procedure Laterality Date  . Tubal ligation    . Bilateral salpingoophorectomy  2013     Dr. Elonda Husky; uterus remains in situ  . Laparoscopic appendectomy  07/16/2011    Procedure: APPENDECTOMY LAPAROSCOPIC;  Surgeon: Donato Heinz, MD;  Location: [facility];  Service: General;  Laterality: N/A;  . Colonoscopy  09/28/09    anal papilla otherwise normal  . Esophagogastroduodenoscopy  10/07/09    Dr. Barrie Dunker of esophageal mucosa, diffusely ?esophagitis (bx benign), small HH/antral erosions, erythema bx benign. 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 Esophagogastroduodenoscopy (egd) with esophageal dilation N/A 03/07/2012    INO:[id number], patent, tubular  esophagus of uncertain significance-status post biopsy )unremarkable). Hiatal hernia  . Colonoscopy N/A 08/29/2012    BSJ:GGEZMOQ polyp-removed as described above. tubular adenoma    There were no vitals taken for this visit.  Visit Diagnosis:  Midline low back pain with sciatica, sciatica laterality unspecified  Stiffness due to immobility      Subjective Assessment - 11/26/13 1600    Symptoms Pt requests a written HEP.  Pt reports 6/10 lumbar pain today.  States it was doing good until she tried to move some furniture at home.   Currently in Pain? Yes   Pain Score 6    Pain Location Back   Pain Orientation Right;Left;Lower            [facility] Adult PT Treatment/Exercise - 11/26/13 1535    Lumbar Exercises: Stretches   Active Hamstring Stretch 3 reps;30 seconds   Active Hamstring Stretch Limitations 14" Box   Passive Hamstring Stretch 3 reps;30 seconds;Limitations   Passive Hamstring Stretch Limitations Gastroc Stretch, Slantboard   Hip Flexor Stretch 3 reps;30 seconds   Hip Flexor Stretch Limitations 14" box bilaterally   Press Ups 3 reps;10 seconds   Quad Stretch 2 reps;30 seconds;Limitations   Quad Stretch Limitations Prone with rope   Piriformis Stretch 3 reps;30 seconds   Piriformis Stretch Limitations seated   Lumbar Exercises: Supine   Bent Knee Raise 10 reps   Bridge 10 reps   Straight Leg Raise 10 reps  Straight Leg Raises Limitations with ab set   Lumbar Exercises: Sidelying   Hip Abduction 10 reps          PT Education - 11/26/13 1556    Education provided Yes   Education Details HEP given   Person(s) Educated Patient   Methods Demonstration;Explanation;Handout   Comprehension Verbalized understanding;Returned demonstration;Verbal cues required;Tactile cues required;Need further instruction              Plan - 11/26/13 1557    Clinical Impression Statement HEP updated and given today.  Added standing hip flexor stretch and instructed with  seated piriformis stretch with patient reporting easier positioning and better stretch obtained.  Pt continues to require verbal and tactile cues to complete exercises in correct form.  Pt reported overall reduction in pain at end of session.     Rehab Potential (ACUTE/IP ONLY) Fair   PT Frequency (ACUTE ONLY) Min 2X/week   PT Duration (ACUTE ONLY) 4 weeks   PT Treatment/Interventions (ACUTE ONLY) Therapeutic activities;Therapeutic exercise;Manual techniques;Modalities   PT Plan Progress trunk stability.  discontinue quadricep stretch.        Problem List Patient Active Problem List   Diagnosis Date Noted  . Encounter for annual physical exam 11/11/2013  . Special screening for malignant neoplasms, colon 11/11/2013  . Stiffness due to immobility 10/31/2013  . Nicotine dependence 09/17/2013  . Hoarseness, persistent 09/16/2013  . Need for prophylactic vaccination and inoculation against influenza 09/16/2013  . Pain in joint, shoulder region 06/22/2013  . Wrist pain, right 06/22/2013  . Cervical pain (neck) 06/21/2013  . Back pain with radiation 06/21/2013  . Back pain 04/03/2013  . Right sided abdominal pain 08/10/2012  . Heme positive stool 08/10/2012  . IDA (iron deficiency anemia) 08/10/2012  . Left-sided epistaxis 06/01/2012  . Leg cramps 04/25/2012  . Diarrhea 04/25/2012  . Microcytosis 04/25/2012  . Globus sensation 02/28/2012  . HTN (hypertension) 02/22/2012  . Cardiorenal syndrome 02/02/2012  . Insomnia 05/09/2011  . Laboratory test 05/05/2011  . GERD (gastroesophageal reflux disease)   . Scleroderma   . Chronic lung disease   . Hypothyroid   . Arteriosclerotic cardiovascular disease (ASCVD)   . Syncope   . Anxiety and depression 06/24/2009  . Tobacco abuse 06/24/2009  . Chronic back pain 06/24/2009  . VITAMIN D DEFICIENCY 03/18/2009  . Hyperlipidemia 03/18/2009         Teena Irani, PTA/CLT 11/26/2013, 4:02 PM

## 2013-11-28 ENCOUNTER — Ambulatory Visit (INDEPENDENT_AMBULATORY_CARE_PROVIDER_SITE_OTHER): Payer: Commercial Managed Care - HMO | Admitting: Otolaryngology

## 2013-11-28 ENCOUNTER — Ambulatory Visit (HOSPITAL_COMMUNITY)
Admission: RE | Admit: 2013-11-28 | Discharge: 2013-11-28 | Disposition: A | Discharge status: Home / Self Care | Payer: Commercial Managed Care - HMO | Source: Ambulatory Visit | Attending: Family Medicine | Admitting: Family Medicine

## 2013-11-28 DIAGNOSIS — M5440 Lumbago with sciatica, unspecified side: Secondary | ICD-10-CM

## 2013-11-28 DIAGNOSIS — M2560 Stiffness of unspecified joint, not elsewhere classified: Secondary | ICD-10-CM

## 2013-11-28 DIAGNOSIS — Z5189 Encounter for other specified aftercare: Secondary | ICD-10-CM | POA: Diagnosis not present

## 2013-11-28 DIAGNOSIS — Z7409 Other reduced mobility: Secondary | ICD-10-CM

## 2013-11-28 LAB — TSH: TSH: 0.481 u[IU]/mL (ref 0.350–4.500)

## 2013-11-28 NOTE — Therapy (Signed)
Physical Therapy Treatment  Patient Details  Name: Kelly Douglas MRN: [id number] Date of Birth: 03/28/1955  Encounter Date: 11/28/2013      PT End of Session - 11/28/13 1549    Visit Number 6   Number of Visits 8   Date for PT Re-Evaluation 11/30/13   Authorization Type [employer]   Authorization - Visit Number 6   Authorization - Number of Visits 8   PT Start Time (ACUTE ONLY) 1436   PT Stop Time (ACUTE ONLY) 1525   PT Time Calculation (min) (ACUTE ONLY) 49 min   Activity Tolerance Patient limited by pain   Behavior During Therapy [facility] for tasks assessed/performed      Past Medical History  Diagnosis Date  . GERD (gastroesophageal reflux disease)     Dr Gala Romney EGD 09/2009->esophagitis, sm HH, antral erosions, atonic esophagus  . Seasonal allergies   . Arthritis   . Scleroderma   . Chronic lung disease     Chronic scarring and volume loss-left lung; characteristics of a chronic infectious process-possible MAI  . Chronic back pain   . Hypothyroid 1981 approx  . Arteriosclerotic cardiovascular disease (ASCVD) 2013    coronary calcification-left main, LAD and CX; small pericardial effusion  . Tobacco abuse 06/24/2009  . Syncope     Multiple spells over the past 40+ years, likely neurocardiogenic  . Hyperlipidemia   . Depression   . Anxiety   . Chronic bronchitis     Past Surgical History  Procedure Laterality Date  . Tubal ligation    . Bilateral salpingoophorectomy  2013     Dr. Elonda Husky; uterus remains in situ  . Laparoscopic appendectomy  07/16/2011    Procedure: APPENDECTOMY LAPAROSCOPIC;  Surgeon: Donato Heinz, MD;  Location: [facility];  Service: General;  Laterality: N/A;  . Colonoscopy  09/28/09    anal papilla otherwise normal  . Esophagogastroduodenoscopy  10/07/09    Dr. Barrie Dunker of esophageal mucosa, diffusely ?esophagitis (bx benign), small HH/antral erosions, erythema bx benign. 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  Esophagogastroduodenoscopy (egd) with esophageal dilation N/A 03/07/2012    HYQ:MVHQIONG, patent, tubular esophagus of uncertain significance-status post biopsy )unremarkable). Hiatal hernia  . Colonoscopy N/A 08/29/2012    EXB:[id number] polyp-removed as described above. tubular adenoma    There were no vitals taken for this visit.  Visit Diagnosis:  Midline low back pain with sciatica, sciatica laterality unspecified  Stiffness due to immobility      Subjective Assessment - 11/28/13 1440    Symptoms Pt states her pain is still up since moving that piece of furniture.  Currently 6/10 in the Lt lumbar region.   Currently in Pain? Yes            [facility] Adult PT Treatment/Exercise - 11/28/13 1548    Lumbar Exercises: Stretches   Active Hamstring Stretch 3 reps;30 seconds   Active Hamstring Stretch Limitations 14" Box   Passive Hamstring Stretch 3 reps;30 seconds;Limitations   Passive Hamstring Stretch Limitations Gastroc Stretch, Slantboard   Hip Flexor Stretch 3 reps;30 seconds   Hip Flexor Stretch Limitations 14" box bilaterally   Quad Stretch 2 reps;30 seconds;Limitations   Quad Stretch Limitations Prone with rope   Traction   Type of Traction Lumbar   Max (lbs) 65   Hold Time Static hold   Time 10 minutes                Plan - 11/28/13 1550    Clinical Impression Statement Continued with  stretches for bilateral LE's to increase lumbar flexibility.  REsumed traction today per patient request as states it helped during previous visits.  Pt did not report immediate pain relief at end of session, however states last time it started easing off during the evening.     PT Plan Progress trunk stability and continue traction if helping to decrease pain.  Progress therex as able due to pain.        Problem List Patient Active Problem List   Diagnosis Date Noted  . Encounter for annual physical exam 11/11/2013  . Special screening for malignant neoplasms, colon  11/11/2013  . Stiffness due to immobility 10/31/2013  . Nicotine dependence 09/17/2013  . Hoarseness, persistent 09/16/2013  . Need for prophylactic vaccination and inoculation against influenza 09/16/2013  . Pain in joint, shoulder region 06/22/2013  . Wrist pain, right 06/22/2013  . Cervical pain (neck) 06/21/2013  . Back pain with radiation 06/21/2013  . Back pain 04/03/2013  . Right sided abdominal pain 08/10/2012  . Heme positive stool 08/10/2012  . IDA (iron deficiency anemia) 08/10/2012  . Left-sided epistaxis 06/01/2012  . Leg cramps 04/25/2012  . Diarrhea 04/25/2012  . Microcytosis 04/25/2012  . Globus sensation 02/28/2012  . HTN (hypertension) 02/22/2012  . Cardiorenal syndrome 02/02/2012  . Insomnia 05/09/2011  . Laboratory test 05/05/2011  . GERD (gastroesophageal reflux disease)   . Scleroderma   . Chronic lung disease   . Hypothyroid   . Arteriosclerotic cardiovascular disease (ASCVD)   . Syncope   . Anxiety and depression 06/24/2009  . Tobacco abuse 06/24/2009  . Chronic back pain 06/24/2009  . VITAMIN D DEFICIENCY 03/18/2009  . Hyperlipidemia 03/18/2009           Teena Irani, PTA/CLT 11/28/2013, 4:34 PM

## 2013-12-03 ENCOUNTER — Encounter (HOSPITAL_COMMUNITY): Payer: Self-pay | Admitting: Physical Therapy

## 2013-12-03 ENCOUNTER — Ambulatory Visit (HOSPITAL_COMMUNITY)
Admission: RE | Admit: 2013-12-03 | Discharge: 2013-12-03 | Disposition: A | Discharge status: Home / Self Care | Payer: Commercial Managed Care - HMO | Source: Ambulatory Visit | Attending: Family Medicine | Admitting: Family Medicine

## 2013-12-03 DIAGNOSIS — Z7409 Other reduced mobility: Secondary | ICD-10-CM

## 2013-12-03 DIAGNOSIS — M2560 Stiffness of unspecified joint, not elsewhere classified: Secondary | ICD-10-CM

## 2013-12-03 DIAGNOSIS — M5440 Lumbago with sciatica, unspecified side: Secondary | ICD-10-CM

## 2013-12-03 DIAGNOSIS — Z5189 Encounter for other specified aftercare: Secondary | ICD-10-CM | POA: Diagnosis not present

## 2013-12-03 NOTE — Therapy (Signed)
Physical Therapy Treatment  Patient Details  Name: Kelly Douglas MRN: [id number] Date of Birth: 09/17/1955  Encounter Date: 12/03/2013      PT End of Session - 12/03/13 1517    Visit Number 7   Number of Visits 8   Date for PT Re-Evaluation 11/30/13   Authorization Type [employer]   Authorization - Visit Number 7   Authorization - Number of Visits 8   PT Start Time 2620   PT Stop Time 1520   PT Time Calculation (min) 28 min   Activity Tolerance Patient limited by pain;Patient tolerated treatment well   Behavior During Therapy [facility] for tasks assessed/performed      Past Medical History  Diagnosis Date  . GERD (gastroesophageal reflux disease)     Dr Gala Romney EGD 09/2009->esophagitis, sm HH, antral erosions, atonic esophagus  . Seasonal allergies   . Arthritis   . Scleroderma   . Chronic lung disease     Chronic scarring and volume loss-left lung; characteristics of a chronic infectious process-possible MAI  . Chronic back pain   . Hypothyroid 1981 approx  . Arteriosclerotic cardiovascular disease (ASCVD) 2013    coronary calcification-left main, LAD and CX; small pericardial effusion  . Tobacco abuse 06/24/2009  . Syncope     Multiple spells over the past 40+ years, likely neurocardiogenic  . Hyperlipidemia   . Depression   . Anxiety   . Chronic bronchitis     Past Surgical History  Procedure Laterality Date  . Tubal ligation    . Bilateral salpingoophorectomy  2013     Dr. Elonda Husky; uterus remains in situ  . Laparoscopic appendectomy  07/16/2011    Procedure: APPENDECTOMY LAPAROSCOPIC;  Surgeon: Donato Heinz, MD;  Location: [facility];  Service: General;  Laterality: N/A;  . Colonoscopy  09/28/09    anal papilla otherwise normal  . Esophagogastroduodenoscopy  10/07/09    Dr. Barrie Dunker of esophageal mucosa, diffusely ?esophagitis (bx benign), small HH/antral erosions, erythema bx benign. 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 Esophagogastroduodenoscopy  (egd) with esophageal dilation N/A 03/07/2012    [id number]:[id number], patent, tubular esophagus of uncertain significance-status post biopsy )unremarkable). Hiatal hernia  . Colonoscopy N/A 08/29/2012    TXM:[id number] polyp-removed as described above. tubular adenoma    There were no vitals taken for this visit.  Visit Diagnosis:  Midline low back pain with sciatica, sciatica laterality unspecified  Stiffness due to immobility      Subjective Assessment - 12/03/13 1508    Symptoms Patient reports improvign pain with traction and prone extension    Currently in Pain? Yes   Pain Score 5    Pain Location Back   Pain Orientation Right;Left;Lower   Pain Type Chronic pain            [facility] Adult PT Treatment/Exercise - 12/03/13 0001    Lumbar Exercises: Stretches   Press Ups 3 reps;10 seconds   Lumbar Exercises: Prone   Single Arm Raise 20 reps   Straight Leg Raise 20 reps   Traction   Type of Traction Lumbar   Max (lbs) 65   Hold Time Static hold   Time 10 minutes            PT Short Term Goals - 12/03/13 1519    PT SHORT TERM GOAL #1   Title Pt will report pain to be decreased to > 5/10.    PT SHORT TERM GOAL #2   Title Pt will be able to sit for 15  minutes in comfort to be able to eat a small meal.    PT SHORT TERM GOAL #3   Title Pt will be able to walk for 15 minutes without increased pain to be able to complete short shopping trips.    PT SHORT TERM GOAL #4   Title Pt will demonstrate increased hip extensor strength to 4/5 to allow pt to come to sit (I)   PT SHORT TERM GOAL #5   Title Pt will be able to verbalize the importance of posture in care of back.           PT Long Term Goals - 12/03/13 1519    PT LONG TERM GOAL #1   Title Pt will be (I) with HEP   PT LONG TERM GOAL #2   Title Pt will be able to sit for 30 minutes without pain to be able to watch a tv show in comfort.    PT LONG TERM GOAL #3   Title Pt will be able to walk for 20-30 minutes 4x/wk for  better health habits.           Plan - 12/03/13 1518    Clinical Impression Statement Patient tratemnt delayed due to staffing issues. Patient responded well to abdominal strengtheing exercises follwoign cuign for abdominal activation. reports pain relief with traction.    PT Plan Progress trunk stability and continue traction to decrease pain.  Progress therex as able due to pain.        Problem List Patient Active Problem List   Diagnosis Date Noted  . Encounter for annual physical exam 11/11/2013  . Special screening for malignant neoplasms, colon 11/11/2013  . Stiffness due to immobility 10/31/2013  . Nicotine dependence 09/17/2013  . Hoarseness, persistent 09/16/2013  . Need for prophylactic vaccination and inoculation against influenza 09/16/2013  . Pain in joint, shoulder region 06/22/2013  . Wrist pain, right 06/22/2013  . Cervical pain (neck) 06/21/2013  . Back pain with radiation 06/21/2013  . Back pain 04/03/2013  . Right sided abdominal pain 08/10/2012  . Heme positive stool 08/10/2012  . IDA (iron deficiency anemia) 08/10/2012  . Left-sided epistaxis 06/01/2012  . Leg cramps 04/25/2012  . Diarrhea 04/25/2012  . Microcytosis 04/25/2012  . Globus sensation 02/28/2012  . HTN (hypertension) 02/22/2012  . Cardiorenal syndrome 02/02/2012  . Insomnia 05/09/2011  . Laboratory test 05/05/2011  . GERD (gastroesophageal reflux disease)   . Scleroderma   . Chronic lung disease   . Hypothyroid   . Arteriosclerotic cardiovascular disease (ASCVD)   . Syncope   . Anxiety and depression 06/24/2009  . Tobacco abuse 06/24/2009  . Chronic back pain 06/24/2009  . VITAMIN D DEFICIENCY 03/18/2009  . Hyperlipidemia 03/18/2009                                              Jailyn Leeson R 12/03/2013, 3:20 PM

## 2013-12-05 ENCOUNTER — Ambulatory Visit (HOSPITAL_COMMUNITY)
Admission: RE | Admit: 2013-12-05 | Discharge: 2013-12-05 | Disposition: A | Discharge status: Home / Self Care | Payer: Commercial Managed Care - HMO | Source: Ambulatory Visit | Attending: Family Medicine | Admitting: Family Medicine

## 2013-12-05 DIAGNOSIS — M2560 Stiffness of unspecified joint, not elsewhere classified: Secondary | ICD-10-CM

## 2013-12-05 DIAGNOSIS — M5440 Lumbago with sciatica, unspecified side: Secondary | ICD-10-CM

## 2013-12-05 DIAGNOSIS — Z7409 Other reduced mobility: Secondary | ICD-10-CM

## 2013-12-05 DIAGNOSIS — Z5189 Encounter for other specified aftercare: Secondary | ICD-10-CM | POA: Diagnosis not present

## 2013-12-05 NOTE — Therapy (Signed)
Physical Therapy Re-Evaluation  Patient Details  Name: Kelly Douglas MRN: [id number] Date of Birth: 1955-07-08  Encounter Date: 12/05/2013      PT End of Session - 12/05/13 1505    PT Start Time 1432   PT Stop Time 1500   PT Time Calculation (min) 28 min   PT Charge Details TE x1, MMT x1   Activity Tolerance Patient tolerated treatment well   Behavior During Therapy [facility] for tasks assessed/performed      Past Medical History  Diagnosis Date  . GERD (gastroesophageal reflux disease)     Dr Gala Romney EGD 09/2009->esophagitis, sm HH, antral erosions, atonic esophagus  . Seasonal allergies   . Arthritis   . Scleroderma   . Chronic lung disease     Chronic scarring and volume loss-left lung; characteristics of a chronic infectious process-possible MAI  . Chronic back pain   . Hypothyroid 1981 approx  . Arteriosclerotic cardiovascular disease (ASCVD) 2013    coronary calcification-left main, LAD and CX; small pericardial effusion  . Tobacco abuse 06/24/2009  . Syncope     Multiple spells over the past 40+ years, likely neurocardiogenic  . Hyperlipidemia   . Depression   . Anxiety   . Chronic bronchitis     Past Surgical History  Procedure Laterality Date  . Tubal ligation    . Bilateral salpingoophorectomy  2013     Dr. Elonda Husky; uterus remains in situ  . Laparoscopic appendectomy  07/16/2011    Procedure: APPENDECTOMY LAPAROSCOPIC;  Surgeon: Donato Heinz, MD;  Location: [facility];  Service: General;  Laterality: N/A;  . Colonoscopy  09/28/09    anal papilla otherwise normal  . Esophagogastroduodenoscopy  10/07/09    Dr. Barrie Dunker of esophageal mucosa, diffusely ?esophagitis (bx benign), small HH/antral erosions, erythema bx benign. 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 Esophagogastroduodenoscopy (egd) with esophageal dilation N/A 03/07/2012    VZS:[id number], patent, tubular esophagus of uncertain significance-status post biopsy )unremarkable). Hiatal hernia  .  Colonoscopy N/A 08/29/2012    JQG:BEEFEOF polyp-removed as described above. tubular adenoma    There were no vitals taken for this visit.  Visit Diagnosis:  Midline low back pain with sciatica, sciatica laterality unspecified  Stiffness due to immobility      Subjective Assessment - 12/05/13 1439    How long can you walk comfortably? Able to walk for ~15-20 minutes (was 10 minutes)   Currently in Pain? No/denies  Best - 0/10   Worst - 8/10          [facility] PT Assessment - 12/05/13 1437    Assessment   Medical Diagnosis LBP   Next MD Visit [place] 12/30/13   Observation/Other Assessments   Focus on Therapeutic Outcomes (FOTO)  FOTO Limitation 50% (was 60%)   AROM   Lumbar Flexion WFL to floor   Lumbar Extension 20 degrees (was decreased by 15%)   Lumbar - Right Side Bend wnl 14 in finger tip to floor   Lumbar - Left Side Bend wnl 15 in fingertip to floor   Strength   Right Hip Flexion --  4-/5   Right Hip Extension 3+/5  (was 3/5)   Right Hip ABduction 4/5   Left Hip Flexion --  4-/5   Left Hip Extension 3+/5  (was 3/5)   Left Hip ABduction 4/5   Right Knee Flexion 5/5  (was 3+/5)   Left Knee Flexion 5/5  (was 3+/5)   Flexibility   Soft Tissue Assessment /Muscle Lenght yes  Hamstrings Rt 90 degrees,  Lt 82 degrees   Quadriceps Rt 1 1/2 in, Lt 3/4 in to glute          [facility] Adult PT Treatment/Exercise - 12/05/13 1436    Exercises   Exercises Lumbar   Lumbar Exercises: Stretches   Active Hamstring Stretch 3 reps;30 seconds   Active Hamstring Stretch Limitations 14" Box   Passive Hamstring Stretch 3 reps;30 seconds;Limitations   Passive Hamstring Stretch Limitations Gastroc Stretch, Slantboard   Hip Flexor Stretch 3 reps;30 seconds   Hip Flexor Stretch Limitations 14" box bilaterally   Press Ups 3 reps;10 seconds   Quad Stretch 1 rep;30 seconds   Quad Stretch Limitations Prone with rope            PT Short Term Goals - 12/05/13 1508    PT SHORT  TERM GOAL #2   Title Pt will be able to sit for 15 minutes in comfort to be able to eat a small meal.    Status Achieved   PT SHORT TERM GOAL #3   Title Pt will be able to walk for 15 minutes without increased pain to be able to complete short shopping trips.    Status Achieved   PT SHORT TERM GOAL #4   Title Pt will demonstrate increased hip extensor strength to 4/5 to allow pt to come to sit (I)   Status On-going   PT SHORT TERM GOAL #5   Title Pt will be able to verbalize the importance of posture in care of back.    Status On-going          PT Long Term Goals - 12/05/13 1509    PT LONG TERM GOAL #1   Title Pt will be (I) with HEP   Status On-going   PT LONG TERM GOAL #2   Title Pt will be able to sit for 30 minutes without pain to be able to watch a tv show in comfort.    Status Achieved   PT LONG TERM GOAL #3   Title Pt will be able to walk for 20-30 minutes 4x/wk for better health habits.    Status Achieved          Plan - 12/05/13 1509    Clinical Impression Statement Re-assessment completed. Pt demonstrates improvments in all areas, with increased strength, ROM, functional mobility and decreased pain. Pt reports no complaints of pain during re-assessment, with most pain 8/10 on one episode in the past week. Recommend continued PT decreased to 1x/wk for 4 additional weeks to progress into functional training (lifting, bending) correctly and ensuring pt compliant with HEP.   Currently pt has met 2/5 STG, and 2/3 LTG.           G-Codes - 12/05/13 1510    Functional Assessment Tool Used FOTO Limitation 50%   Functional Limitation Other PT primary   Other PT Primary Current Status (G8990) At least 40 percent but less than 60 percent impaired, limited or restricted   Other PT Primary Goal Status (G8991) At least 20 percent but less than 40 percent impaired, limited or restricted      Problem List Patient Active Problem List   Diagnosis Date Noted  . Encounter for  annual physical exam 11/11/2013  . Special screening for malignant neoplasms, colon 11/11/2013  . Stiffness due to immobility 10/31/2013  . Nicotine dependence 09/17/2013  . Hoarseness, persistent 09/16/2013  . Need for prophylactic vaccination and inoculation against influenza 09/16/2013  . Pain   in joint, shoulder region 06/22/2013  . Wrist pain, right 06/22/2013  . Cervical pain (neck) 06/21/2013  . Back pain with radiation 06/21/2013  . Back pain 04/03/2013  . Right sided abdominal pain 08/10/2012  . Heme positive stool 08/10/2012  . IDA (iron deficiency anemia) 08/10/2012  . Left-sided epistaxis 06/01/2012  . Leg cramps 04/25/2012  . Diarrhea 04/25/2012  . Microcytosis 04/25/2012  . Globus sensation 02/28/2012  . HTN (hypertension) 02/22/2012  . Cardiorenal syndrome 02/02/2012  . Insomnia 05/09/2011  . Laboratory test 05/05/2011  . GERD (gastroesophageal reflux disease)   . Scleroderma   . Chronic lung disease   . Hypothyroid   . Arteriosclerotic cardiovascular disease (ASCVD)   . Syncope   . Anxiety and depression 06/24/2009  . Tobacco abuse 06/24/2009  . Chronic back pain 06/24/2009  . VITAMIN D DEFICIENCY 03/18/2009  . Hyperlipidemia 03/18/2009       WOODWORTH,STEPHANIE 12/05/2013, 3:15 PM       

## 2013-12-10 ENCOUNTER — Ambulatory Visit (HOSPITAL_COMMUNITY)
Admission: RE | Admit: 2013-12-10 | Discharge: 2013-12-10 | Disposition: A | Discharge status: Home / Self Care | Payer: Commercial Managed Care - HMO | Source: Ambulatory Visit | Attending: Family Medicine | Admitting: Family Medicine

## 2013-12-10 DIAGNOSIS — Z7409 Other reduced mobility: Secondary | ICD-10-CM

## 2013-12-10 DIAGNOSIS — Z5189 Encounter for other specified aftercare: Secondary | ICD-10-CM | POA: Diagnosis not present

## 2013-12-10 DIAGNOSIS — M2560 Stiffness of unspecified joint, not elsewhere classified: Secondary | ICD-10-CM

## 2013-12-10 DIAGNOSIS — M5440 Lumbago with sciatica, unspecified side: Secondary | ICD-10-CM

## 2013-12-10 NOTE — Therapy (Signed)
Physical Therapy Treatment  Patient Details  Name: Kelly Douglas MRN: [id number] Date of Birth: [birth year]  Encounter Date: 12/10/2013    Past Medical History  Diagnosis Date  . GERD (gastroesophageal reflux disease)     Dr Gala Romney EGD 09/2009->esophagitis, sm HH, antral erosions, atonic esophagus  . Seasonal allergies   . Arthritis   . Scleroderma   . Chronic lung disease     Chronic scarring and volume loss-left lung; characteristics of a chronic infectious process-possible MAI  . Chronic back pain   . Hypothyroid 1981 approx  . Arteriosclerotic cardiovascular disease (ASCVD) 2013    coronary calcification-left main, LAD and CX; small pericardial effusion  . Tobacco abuse 06/24/2009  . Syncope     Multiple spells over the past 40+ years, likely neurocardiogenic  . Hyperlipidemia   . Depression   . Anxiety   . Chronic bronchitis     Past Surgical History  Procedure Laterality Date  . Tubal ligation    . Bilateral salpingoophorectomy  2013     Dr. Elonda Husky; uterus remains in situ  . Laparoscopic appendectomy  07/16/2011    Procedure: APPENDECTOMY LAPAROSCOPIC;  Surgeon: Donato Heinz, MD;  Location: [facility];  Service: General;  Laterality: N/A;  . Colonoscopy  09/28/09    anal papilla otherwise normal  . Esophagogastroduodenoscopy  10/07/09    Dr. Barrie Dunker of esophageal mucosa, diffusely ?esophagitis (bx benign), small HH/antral erosions, erythema bx benign. 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 Esophagogastroduodenoscopy (egd) with esophageal dilation N/A 03/07/2012    [facility]U:[id number], patent, tubular esophagus of uncertain significance-status post biopsy )unremarkable). Hiatal hernia  . Colonoscopy N/A 08/29/2012    UKG:URKYHCW polyp-removed as described above. tubular adenoma    There were no vitals taken for this visit.  Visit Diagnosis:  Midline low back pain with sciatica, sciatica laterality unspecified  Stiffness due to immobility       [facility] Adult  PT Treatment/Exercise - 12/10/13 1446    Exercises   Exercises Lumbar   Lumbar Exercises: Stretches   Active Hamstring Stretch 3 reps;30 seconds   Active Hamstring Stretch Limitations 14" Box   Press Ups Limitations   Press Ups Limitations 8 reps   Lumbar Exercises: Prone   Single Arm Raise 20 reps   Straight Leg Raise 20 reps   Traction   Type of Traction Lumbar   Max (lbs) 70   Hold Time Static hold   Time 15 minutes            PT Short Term Goals - 12/10/13 1519    PT SHORT TERM GOAL #2   Title Pt will be able to sit for 15 minutes in comfort to be able to eat a small meal.    Status Achieved   PT SHORT TERM GOAL #3   Title Pt will be able to walk for 15 minutes without increased pain to be able to complete short shopping trips.    Status Achieved   PT SHORT TERM GOAL #4   Title Pt will demonstrate increased hip extensor strength to 4/5 to allow pt to come to sit (I)   Status On-going   PT SHORT TERM GOAL #5   Title Pt will be able to verbalize the importance of posture in care of back.    Status On-going          PT Long Term Goals - 12/10/13 1519    PT LONG TERM GOAL #1   Title Pt will be (I) with HEP  Status On-going   PT LONG TERM GOAL #2   Title Pt will be able to sit for 30 minutes without pain to be able to watch a tv show in comfort.    Status Achieved   PT LONG TERM GOAL #3   Title Pt will be able to walk for 20-30 minutes 4x/wk for better health habits.    Status Achieved          Plan - 12/10/13 1517    Clinical Impression Statement Completed stretches and prone exercises today.  Held on new activites per request and continued with traction.  Able to increase 5# today with good results.  Pt reported being painfree following session today.  Will need progression into highter level functional activities.    PT Plan Progress functional strengthening, with correct technique with lifting and squatting.         Problem List Patient Active  Problem List   Diagnosis Date Noted  . Encounter for annual physical exam 11/11/2013  . Special screening for malignant neoplasms, colon 11/11/2013  . Stiffness due to immobility 10/31/2013  . Nicotine dependence 09/17/2013  . Hoarseness, persistent 09/16/2013  . Need for prophylactic vaccination and inoculation against influenza 09/16/2013  . Pain in joint, shoulder region 06/22/2013  . Wrist pain, right 06/22/2013  . Cervical pain (neck) 06/21/2013  . Back pain with radiation 06/21/2013  . Back pain 04/03/2013  . Right sided abdominal pain 08/10/2012  . Heme positive stool 08/10/2012  . IDA (iron deficiency anemia) 08/10/2012  . Left-sided epistaxis 06/01/2012  . Leg cramps 04/25/2012  . Diarrhea 04/25/2012  . Microcytosis 04/25/2012  . Globus sensation 02/28/2012  . HTN (hypertension) 02/22/2012  . Cardiorenal syndrome 02/02/2012  . Insomnia 05/09/2011  . Laboratory test 05/05/2011  . GERD (gastroesophageal reflux disease)   . Scleroderma   . Chronic lung disease   . Hypothyroid   . Arteriosclerotic cardiovascular disease (ASCVD)   . Syncope   . Anxiety and depression 06/24/2009  . Tobacco abuse 06/24/2009  . Chronic back pain 06/24/2009  . VITAMIN D DEFICIENCY 03/18/2009  . Hyperlipidemia 03/18/2009      Teena Irani, PTA/CLT [phone] 12/10/2013, 3:20 PM

## 2013-12-11 ENCOUNTER — Ambulatory Visit (HOSPITAL_COMMUNITY): Payer: Commercial Managed Care - HMO | Admitting: Physical Therapy

## 2013-12-15 ENCOUNTER — Emergency Department (HOSPITAL_COMMUNITY): Payer: Commercial Managed Care - HMO

## 2013-12-15 ENCOUNTER — Encounter (HOSPITAL_COMMUNITY): Payer: Self-pay | Admitting: Emergency Medicine

## 2013-12-15 ENCOUNTER — Emergency Department (HOSPITAL_COMMUNITY)
Admission: EM | Admit: 2013-12-15 | Discharge: 2013-12-15 | Disposition: A | Discharge status: Home / Self Care | Payer: Commercial Managed Care - HMO | Attending: Emergency Medicine | Admitting: Emergency Medicine

## 2013-12-15 ENCOUNTER — Other Ambulatory Visit: Payer: Self-pay | Admitting: Family Medicine

## 2013-12-15 DIAGNOSIS — Z9889 Other specified postprocedural states: Secondary | ICD-10-CM | POA: Insufficient documentation

## 2013-12-15 DIAGNOSIS — Z7982 Long term (current) use of aspirin: Secondary | ICD-10-CM | POA: Diagnosis not present

## 2013-12-15 DIAGNOSIS — R109 Unspecified abdominal pain: Secondary | ICD-10-CM

## 2013-12-15 DIAGNOSIS — M1990 Unspecified osteoarthritis, unspecified site: Secondary | ICD-10-CM | POA: Diagnosis not present

## 2013-12-15 DIAGNOSIS — Z9851 Tubal ligation status: Secondary | ICD-10-CM | POA: Insufficient documentation

## 2013-12-15 DIAGNOSIS — R110 Nausea: Secondary | ICD-10-CM | POA: Diagnosis not present

## 2013-12-15 DIAGNOSIS — I2510 Atherosclerotic heart disease of native coronary artery without angina pectoris: Secondary | ICD-10-CM | POA: Insufficient documentation

## 2013-12-15 DIAGNOSIS — E039 Hypothyroidism, unspecified: Secondary | ICD-10-CM | POA: Insufficient documentation

## 2013-12-15 DIAGNOSIS — R197 Diarrhea, unspecified: Secondary | ICD-10-CM | POA: Diagnosis not present

## 2013-12-15 DIAGNOSIS — Z720 Tobacco use: Secondary | ICD-10-CM | POA: Insufficient documentation

## 2013-12-15 DIAGNOSIS — F329 Major depressive disorder, single episode, unspecified: Secondary | ICD-10-CM | POA: Diagnosis not present

## 2013-12-15 DIAGNOSIS — Z79899 Other long term (current) drug therapy: Secondary | ICD-10-CM | POA: Diagnosis not present

## 2013-12-15 DIAGNOSIS — Z8709 Personal history of other diseases of the respiratory system: Secondary | ICD-10-CM | POA: Diagnosis not present

## 2013-12-15 DIAGNOSIS — G8929 Other chronic pain: Secondary | ICD-10-CM | POA: Diagnosis not present

## 2013-12-15 DIAGNOSIS — R52 Pain, unspecified: Secondary | ICD-10-CM

## 2013-12-15 DIAGNOSIS — R630 Anorexia: Secondary | ICD-10-CM | POA: Diagnosis not present

## 2013-12-15 DIAGNOSIS — R1032 Left lower quadrant pain: Secondary | ICD-10-CM | POA: Diagnosis not present

## 2013-12-15 DIAGNOSIS — K219 Gastro-esophageal reflux disease without esophagitis: Secondary | ICD-10-CM | POA: Diagnosis not present

## 2013-12-15 DIAGNOSIS — R1033 Periumbilical pain: Secondary | ICD-10-CM | POA: Diagnosis not present

## 2013-12-15 DIAGNOSIS — F419 Anxiety disorder, unspecified: Secondary | ICD-10-CM | POA: Diagnosis not present

## 2013-12-15 DIAGNOSIS — R1031 Right lower quadrant pain: Secondary | ICD-10-CM | POA: Insufficient documentation

## 2013-12-15 LAB — BASIC METABOLIC PANEL
Anion gap: 13 (ref 5–15)
BUN: 5 mg/dL — AB (ref 6–23)
CHLORIDE: 98 meq/L (ref 96–112)
CO2: 27 meq/L (ref 19–32)
Calcium: 9.9 mg/dL (ref 8.4–10.5)
Creatinine, Ser: 0.65 mg/dL (ref 0.50–1.10)
GFR calc non Af Amer: >90 mL/min (ref >90)
Glucose, Bld: 127 mg/dL — ABNORMAL HIGH (ref 70–99)
POTASSIUM: 3.9 meq/L (ref 3.7–5.3)
Sodium: 138 mEq/L (ref 137–147)

## 2013-12-15 LAB — URINALYSIS, ROUTINE W REFLEX MICROSCOPIC
BILIRUBIN URINE: NEGATIVE
Glucose, UA: NEGATIVE mg/dL
Ketones, ur: NEGATIVE mg/dL
Leukocytes, UA: NEGATIVE
Nitrite: NEGATIVE
PH: 8.5 — AB (ref 5.0–8.0)
Protein, ur: NEGATIVE mg/dL
SPECIFIC GRAVITY, URINE: 1.015 (ref 1.005–1.030)
UROBILINOGEN UA: 0.2 mg/dL (ref 0.0–1.0)

## 2013-12-15 LAB — HEPATIC FUNCTION PANEL
ALBUMIN: 4.3 g/dL (ref 3.5–5.2)
ALT: 14 U/L (ref 0–35)
AST: 18 U/L (ref 0–37)
Alkaline Phosphatase: 130 U/L — ABNORMAL HIGH (ref 39–117)
BILIRUBIN TOTAL: 0.3 mg/dL (ref 0.3–1.2)
Bilirubin, Direct: <0.2 mg/dL (ref 0.0–0.3)
Total Protein: 8.1 g/dL (ref 6.0–8.3)

## 2013-12-15 LAB — CBC WITH DIFFERENTIAL/PLATELET
Basophils Absolute: 0 10*3/uL (ref 0.0–0.1)
Basophils Relative: 0 % (ref 0–1)
EOS ABS: 0.1 10*3/uL (ref 0.0–0.7)
EOS PCT: 0 % (ref 0–5)
HCT: 40.8 % (ref 36.0–46.0)
Hemoglobin: 13.6 g/dL (ref 12.0–15.0)
LYMPHS PCT: 19 % (ref 12–46)
Lymphs Abs: 2.4 10*3/uL (ref 0.7–4.0)
MCH: 26.3 pg (ref 26.0–34.0)
MCHC: 33.3 g/dL (ref 30.0–36.0)
MCV: 78.8 fL (ref 78.0–100.0)
MONOS PCT: 5 % (ref 3–12)
Monocytes Absolute: 0.7 10*3/uL (ref 0.1–1.0)
NEUTROS PCT: 76 % (ref 43–77)
Neutro Abs: 9.9 10*3/uL — ABNORMAL HIGH (ref 1.7–7.7)
PLATELETS: 298 10*3/uL (ref 150–400)
RBC: 5.18 MIL/uL — AB (ref 3.87–5.11)
RDW: 15.2 % (ref 11.5–15.5)
WBC: 13.1 10*3/uL — AB (ref 4.0–10.5)

## 2013-12-15 LAB — URINE MICROSCOPIC-ADD ON

## 2013-12-15 LAB — LIPASE, BLOOD: Lipase: 12 U/L (ref 11–59)

## 2013-12-15 MED ORDER — HYDROMORPHONE HCL 1 MG/ML IJ SOLN
1.0000 mg | Freq: Once | INTRAMUSCULAR | Status: AC
  Administered 2013-12-15: 1 mg via INTRAVENOUS
  Filled 2013-12-15: qty 1

## 2013-12-15 MED ORDER — SODIUM CHLORIDE 0.9 % IV BOLUS (SEPSIS)
2000.0000 mL | Freq: Once | INTRAVENOUS | Status: AC
  Administered 2013-12-15: 2000 mL via INTRAVENOUS

## 2013-12-15 MED ORDER — ONDANSETRON 4 MG PO TBDP: 4.0000 mg | ORAL_TABLET | Freq: Three times a day (TID) | ORAL | Status: DC | PRN

## 2013-12-15 MED ORDER — IOHEXOL 300 MG/ML  SOLN
50.0000 mL | Freq: Once | INTRAMUSCULAR | Status: AC | PRN
  Administered 2013-12-15: 50 mL via ORAL

## 2013-12-15 MED ORDER — IOHEXOL 300 MG/ML  SOLN
100.0000 mL | Freq: Once | INTRAMUSCULAR | Status: AC | PRN
  Administered 2013-12-15: 100 mL via INTRAVENOUS

## 2013-12-15 MED ORDER — ONDANSETRON HCL 4 MG/2ML IJ SOLN
4.0000 mg | Freq: Once | INTRAMUSCULAR | Status: AC
  Administered 2013-12-15: 4 mg via INTRAVENOUS
  Filled 2013-12-15: qty 2

## 2013-12-15 NOTE — ED Provider Notes (Signed)
4:30 PM  Assumed care from Dr. Winfred Leeds.  Pt is a 58 y.o. F who presents with. Umbilical abdominal pain. CT scan shows no acute abnormality. She is status post appendectomy and bilateral oophorectomy and cholecystectomy.  She does have a mild leukocytosis with left shift. Urine shows small hemoglobin but no other sign of infection.  Patient reports that she is not sexually active. She states she has not had sexual intercourse in many months. No vaginal bleeding or discharge. No lower abdominal pain on exam. She does have some mild. Umbilical pain currently with reports she is feeling much better. She states she has had diarrhea and nausea. Discussed with patient that this may be a viral illness. Discussed with her strict return precautions. She has outpatient follow-up scheduled this week. She takes chronic pain medication at home. We'll discharge with prescription for Zofran.  [place], DO 12/15/13 1659

## 2013-12-15 NOTE — ED Provider Notes (Addendum)
CSN: [id number]     Arrival date & time 12/15/13  1307 History  This chart was scribed for Orlie Dakin, MD by [facility], ED Scribe. The patient was seen in [place] and the patient's care was started at 2:05 PM.  Chief Complaint  Patient presents with  . Abdominal Pain   Patient is a 58 y.o. female presenting with abdominal pain. The history is provided by the patient. No language interpreter was used.  Abdominal Pain Associated symptoms: no diarrhea, no nausea, no vaginal discharge and no vomiting     HPI Comments: Kelly Douglas is a 58 y.o. female who presents to the Emergency Department complaining of periumbilical abdominal pain,  which started at midnight which is non radiating and gradually worsening, sharp and severe. Her last normal BM was 30 min. Pt has not eaten today and is not hungry. Pt does smoke. No treatment prior to coming here nothing makes pain better or worse She does not drink or use illegal drugs. She denies vomiting, fever, diarrhea and vaginal discharge. Pt has no other associated symptoms.  Past Medical History  Diagnosis Date  . GERD (gastroesophageal reflux disease)     Dr Gala Romney EGD 09/2009->esophagitis, sm HH, antral erosions, atonic esophagus  . Seasonal allergies   . Arthritis   . Scleroderma   . Chronic lung disease     Chronic scarring and volume loss-left lung; characteristics of a chronic infectious process-possible MAI  . Chronic back pain   . Hypothyroid 1981 approx  . Arteriosclerotic cardiovascular disease (ASCVD) 2013    coronary calcification-left main, LAD and CX; small pericardial effusion  . Tobacco abuse 06/24/2009  . Syncope     Multiple spells over the past 40+ years, likely neurocardiogenic  . Hyperlipidemia   . Depression   . Anxiety   . Chronic bronchitis    Past Surgical History  Procedure Laterality Date  . Tubal ligation    . Bilateral salpingoophorectomy  2013     Dr. Elonda Husky; uterus remains in situ  . Laparoscopic  appendectomy  07/16/2011    Procedure: APPENDECTOMY LAPAROSCOPIC;  Surgeon: Donato Heinz, MD;  Location: [facility];  Service: General;  Laterality: N/A;  . Colonoscopy  09/28/09    anal papilla otherwise normal  . Esophagogastroduodenoscopy  10/07/09    Dr. Barrie Dunker of esophageal mucosa, diffusely ?esophagitis (bx benign), small HH/antral erosions, erythema bx benign. 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 Esophagogastroduodenoscopy (egd) with esophageal dilation N/A 03/07/2012    XQJ:JHERDEYC, patent, tubular esophagus of uncertain significance-status post biopsy )unremarkable). Hiatal hernia  . Colonoscopy N/A 08/29/2012    XKG:YJEHUDJ polyp-removed as described above. tubular adenoma   Family History  Problem Relation Age of Onset  . Throat cancer Brother   . Stroke Father   . Coronary artery disease Brother   . Down syndrome Brother   . Hypertension Brother     father/mother  . Anesthesia problems Neg Hx   . Hypotension Neg Hx   . Malignant hyperthermia Neg Hx   . Pseudochol deficiency Neg Hx   . Colon cancer Neg Hx    History  Substance Use Topics  . Smoking status: Current Every Day Smoker -- 1.00 packs/day for 40 years    Types: Cigarettes  . Smokeless tobacco: Never Used     Comment: Smokes about a pack of cigarettes a day  . Alcohol Use: No     Comment: quit June 2012-used to drink 6 pack daily   OB History  Gravida Para Term Preterm AB TAB SAB Ectopic Multiple Living   2 2 2       2      Review of Systems  Constitutional: Positive for appetite change.  HENT: Negative.   Respiratory: Negative.   Cardiovascular: Negative.   Gastrointestinal: Positive for abdominal pain. Negative for nausea, vomiting and diarrhea.  Genitourinary: Negative for vaginal discharge.  Musculoskeletal: Negative.   Skin: Negative.   Allergic/Immunologic: Negative.   Neurological: Negative.   Hematological: Negative.   Psychiatric/Behavioral: Negative.   All other systems  reviewed and are negative.  Allergies  Review of patient's allergies indicates no known allergies.  Home Medications   Prior to Admission medications   Medication Sig Start Date End Date Taking? Authorizing Provider  amLODipine (NORVASC) 5 MG tablet TAKE 1 TABLET BY MOUTH EVERY DAY 10/24/13  Yes [name], MD  aspirin (ASPIRIN EC) 81 MG EC tablet Take 81 mg by mouth daily. Swallow whole.   Yes Historical Provider, MD  busPIRone (BUSPAR) 5 MG tablet Take 1 tablet (5 mg total) by mouth 3 (three) times daily. 10/24/13  Yes [name], MD  DULoxetine (CYMBALTA) 30 MG capsule Take 60 mg by mouth daily.   Yes Historical Provider, MD  ferrous sulfate 325 (65 FE) MG EC tablet Take 1 tablet (325 mg total) by mouth 2 (two) times daily. 01/29/13  Yes Mahala Menghini, PA-C  gabapentin (NEURONTIN) 600 MG tablet Take 600 mg by mouth 3 (three) times daily.   Yes Historical Provider, MD  levothyroxine (SYNTHROID, LEVOTHROID) 75 MCG tablet TAKE 1 TABLET BY MOUTH EVERY DAY 10/24/13  Yes [name], MD  Multiple Vitamins-Calcium (ONE-A-DAY WOMENS PO) Take 1 tablet by mouth daily.   Yes Historical Provider, MD  pantoprazole (PROTONIX) 40 MG tablet TAKE 1 TABLET BY MOUTH EVERY DAY 10/24/13  Yes [name], MD  pravastatin (PRAVACHOL) 40 MG tablet TAKE 1 TABLET BY MOUTH EVERY DAY 10/24/13  Yes [name], MD  traMADol (ULTRAM) 50 MG tablet Take 50 mg by mouth every 6 (six) hours as needed. 1-2 09/05/13  Yes Phillips Odor, MD  traZODone (DESYREL) 100 MG tablet TAKE 1 TABLET BY MOUTH EVERY NIGHT AT BEDTIME 10/24/13  Yes [name], MD  acetaminophen (TYLENOL) 500 MG tablet Take 1,000 mg by mouth every 6 (six) hours as needed for pain.    Historical Provider, MD  albuterol (PROVENTIL HFA;VENTOLIN HFA) 108 (90 BASE) MCG/ACT inhaler Inhale 2 puffs into the lungs every 6 (six) hours as needed. Shortness of breath 08/02/12   [name], MD  gabapentin (NEURONTIN) 300 MG  capsule TAKE 1 CAPSULE BY MOUTH THREE TIMES DAILY 03/21/13   [name], MD  pantoprazole (PROTONIX) 40 MG tablet TAKE 1 TABLET BY MOUTH DAILY 03/06/13   [name], MD  traZODone (DESYREL) 50 MG tablet TAKE 1 TABLET BY MOUTH EVERY NIGHT AT BEDTIME FOR SLEEP 07/26/13   [name], MD   BP 171/86 mmHg  Pulse 91  Temp(Src) 98.6 F (37 C) (Oral)  Resp 18  Ht 5\' 8"  (1.727 m)  Wt 150 lb (68.04 kg)  BMI 22.81 kg/m2  SpO2 100% Physical Exam  Constitutional: She appears well-developed and well-nourished. She appears distressed.  Appears uncomfortable  HENT:  Head: Normocephalic and atraumatic.  Eyes: Conjunctivae are normal. Pupils are equal, round, and reactive to light.  Neck: Neck supple. No tracheal deviation present. No thyromegaly present.  Cardiovascular: Normal rate and regular rhythm.  No murmur heard. Pulmonary/Chest: Effort normal and breath sounds normal.  Abdominal: Soft. Bowel sounds are normal. She exhibits no distension. There is tenderness.  Tenderness over. Umbilical area and bilateral lower quadrants  Musculoskeletal: Normal range of motion. She exhibits no edema or tenderness.  Neurological: She is alert. Coordination normal.  Skin: Skin is warm and dry. No rash noted.  Psychiatric: She has a normal mood and affect.  Nursing note and vitals reviewed.   ED Course  Procedures  DIAGNOSTIC STUDIES: Oxygen Saturation is 100% on room air, normal by my interpretation.    COORDINATION OF CARE: 2:08 PM Discussed treatment plan with pt at bedside and pt agreed to plan.  Labs Review Labs Reviewed  URINALYSIS, ROUTINE W REFLEX MICROSCOPIC - Abnormal; Notable for the following:    APPearance HAZY (*)    pH 8.5 (*)    Hgb urine dipstick SMALL (*)    All other components within normal limits  CBC WITH DIFFERENTIAL - Abnormal; Notable for the following:    WBC 13.1 (*)    RBC 5.18 (*)    Neutro Abs 9.9 (*)    All other components within normal  limits  URINE MICROSCOPIC-ADD ON  BASIC METABOLIC PANEL  HEPATIC FUNCTION PANEL  LIPASE, BLOOD   Imaging Review No results found.   EKG Interpretation None      Results for orders placed or performed during the hospital encounter of 12/15/13  Urinalysis, Routine w reflex microscopic  Result Value Ref Range   Color, Urine YELLOW YELLOW   APPearance HAZY (A) CLEAR   Specific Gravity, Urine 1.015 1.005 - 1.030   pH 8.5 (H) 5.0 - 8.0   Glucose, UA NEGATIVE NEGATIVE mg/dL   Hgb urine dipstick SMALL (A) NEGATIVE   Bilirubin Urine NEGATIVE NEGATIVE   Ketones, ur NEGATIVE NEGATIVE mg/dL   Protein, ur NEGATIVE NEGATIVE mg/dL   Urobilinogen, UA 0.2 0.0 - 1.0 mg/dL   Nitrite NEGATIVE NEGATIVE   Leukocytes, UA NEGATIVE NEGATIVE  CBC with Differential  Result Value Ref Range   WBC 13.1 (H) 4.0 - 10.5 K/uL   RBC 5.18 (H) 3.87 - 5.11 MIL/uL   Hemoglobin 13.6 12.0 - 15.0 g/dL   HCT 40.8 36.0 - 46.0 %   MCV 78.8 78.0 - 100.0 fL   MCH 26.3 26.0 - 34.0 pg   MCHC 33.3 30.0 - 36.0 g/dL   RDW 15.2 11.5 - 15.5 %   Platelets 298 150 - 400 K/uL   Neutrophils Relative % 76 43 - 77 %   Neutro Abs 9.9 (H) 1.7 - 7.7 K/uL   Lymphocytes Relative 19 12 - 46 %   Lymphs Abs 2.4 0.7 - 4.0 K/uL   Monocytes Relative 5 3 - 12 %   Monocytes Absolute 0.7 0.1 - 1.0 K/uL   Eosinophils Relative 0 0 - 5 %   Eosinophils Absolute 0.1 0.0 - 0.7 K/uL   Basophils Relative 0 0 - 1 %   Basophils Absolute 0.0 0.0 - 0.1 K/uL  Basic metabolic panel  Result Value Ref Range   Sodium 138 137 - 147 mEq/L   Potassium 3.9 3.7 - 5.3 mEq/L   Chloride 98 96 - 112 mEq/L   CO2 27 19 - 32 mEq/L   Glucose, Bld 127 (H) 70 - 99 mg/dL   BUN 5 (L) 6 - 23 mg/dL   Creatinine, Ser 0.65 0.50 - 1.10 mg/dL   Calcium 9.9 8.4 - 10.5 mg/dL   GFR calc non Af Amer >  90 >90 mL/min   GFR calc Af Amer >90 >90 mL/min   Anion gap 13 5 - 15  Hepatic function panel  Result Value Ref Range   Total Protein 8.1 6.0 - 8.3 g/dL   Albumin 4.3 3.5  - 5.2 g/dL   AST 18 0 - 37 U/L   ALT 14 0 - 35 U/L   Alkaline Phosphatase 130 (H) 39 - 117 U/L   Total Bilirubin 0.3 0.3 - 1.2 mg/dL   Bilirubin, Direct <0.2 0.0 - 0.3 mg/dL   Indirect Bilirubin NOT CALCULATED 0.3 - 0.9 mg/dL  Lipase, blood  Result Value Ref Range   Lipase 12 11 - 59 U/L  Urine microscopic-add on  Result Value Ref Range   Squamous Epithelial / LPF RARE RARE   WBC, UA 0-2 <3 WBC/hpf   RBC / HPF 3-6 <3 RBC/hpf   No results found.  1455 PM pain improved after treatment with intravenous hydromorphone. Requesting more pain medicine. Additional IV hydromorphone ordered 3:28 PM patient's pain is improved. Requesting antiemetic. Zofran ordered  MDM   Final diagnoses:  None   Pt signed out to Dr. Leonides Schanz 330 pm who will reassess pt and disposition  Dx :abdominal pain     Orlie Dakin, MD 12/15/13 [place], MD 12/15/13 1535

## 2013-12-15 NOTE — ED Notes (Signed)
Patient c/o mid abd pain that started last night. Denies any nausea, vomiting, diarrhea, fevers, or urinary symptoms.

## 2013-12-15 NOTE — Discharge Instructions (Signed)
Abdominal Pain, Women °Abdominal (stomach, pelvic, or belly) pain can be caused by many things. It is important to tell your doctor: °· The location of the pain. °· Does it come and go or is it present all the time? °· Are there things that start the pain (eating certain foods, exercise)? °· Are there other symptoms associated with the pain (fever, nausea, vomiting, diarrhea)? °All of this is helpful to know when trying to find the cause of the pain. °CAUSES  °· Stomach: virus or bacteria infection, or ulcer. °· Intestine: appendicitis (inflamed appendix), regional ileitis (Crohn's disease), ulcerative colitis (inflamed colon), irritable bowel syndrome, diverticulitis (inflamed diverticulum of the colon), or cancer of the stomach or intestine. °· Gallbladder disease or stones in the gallbladder. °· Kidney disease, kidney stones, or infection. °· Pancreas infection or cancer. °· Fibromyalgia (pain disorder). °· Diseases of the female organs: °¨ Uterus: fibroid (non-cancerous) tumors or infection. °¨ Fallopian tubes: infection or tubal pregnancy. °¨ Ovary: cysts or tumors. °¨ Pelvic adhesions (scar tissue). °¨ Endometriosis (uterus lining tissue growing in the pelvis and on the pelvic organs). °¨ Pelvic congestion syndrome (female organs filling up with blood just before the menstrual period). °¨ Pain with the menstrual period. °¨ Pain with ovulation (producing an egg). °¨ Pain with an IUD (intrauterine device, birth control) in the uterus. °¨ Cancer of the female organs. °· Functional pain (pain not caused by a disease, may improve without treatment). °· Psychological pain. °· Depression. °DIAGNOSIS  °Your doctor will decide the seriousness of your pain by doing an examination. °· Blood tests. °· X-rays. °· Ultrasound. °· CT scan (computed tomography, special type of X-ray). °· MRI (magnetic resonance imaging). °· Cultures, for infection. °· Barium enema (dye inserted in the large intestine, to better view it with  X-rays). °· Colonoscopy (looking in intestine with a lighted tube). °· Laparoscopy (minor surgery, looking in abdomen with a lighted tube). °· Major abdominal exploratory surgery (looking in abdomen with a large incision). °TREATMENT  °The treatment will depend on the cause of the pain.  °· Many cases can be observed and treated at home. °· Over-the-counter medicines recommended by your caregiver. °· Prescription medicine. °· Antibiotics, for infection. °· Birth control pills, for painful periods or for ovulation pain. °· Hormone treatment, for endometriosis. °· Nerve blocking injections. °· Physical therapy. °· Antidepressants. °· Counseling with a psychologist or psychiatrist. °· Minor or major surgery. °HOME CARE INSTRUCTIONS  °· Do not take laxatives, unless directed by your caregiver. °· Take over-the-counter pain medicine only if ordered by your caregiver. Do not take aspirin because it can cause an upset stomach or bleeding. °· Try a clear liquid diet (broth or water) as ordered by your caregiver. Slowly move to a bland diet, as tolerated, if the pain is related to the stomach or intestine. °· Have a thermometer and take your temperature several times a day, and record it. °· Bed rest and sleep, if it helps the pain. °· Avoid sexual intercourse, if it causes pain. °· Avoid stressful situations. °· Keep your follow-up appointments and tests, as your caregiver orders. °· If the pain does not go away with medicine or surgery, you may try: °¨ Acupuncture. °¨ Relaxation exercises (yoga, meditation). °¨ Group therapy. °¨ Counseling. °SEEK MEDICAL CARE IF:  °· You notice certain foods cause stomach pain. °· Your home care treatment is not helping your pain. °· You need stronger pain medicine. °· You want your IUD removed. °· You feel faint or   lightheaded. °· You develop nausea and vomiting. °· You develop a rash. °· You are having side effects or an allergy to your medicine. °SEEK IMMEDIATE MEDICAL CARE IF:  °· Your  pain does not go away or gets worse. °· You have a fever. °· Your pain is felt only in portions of the abdomen. The right side could possibly be appendicitis. The left lower portion of the abdomen could be colitis or diverticulitis. °· You are passing blood in your stools (bright red or black tarry stools, with or without vomiting). °· You have blood in your urine. °· You develop chills, with or without a fever. °· You pass out. °MAKE SURE YOU:  °· Understand these instructions. °· Will watch your condition. °· Will get help right away if you are not doing well or get worse. °Document Released: 10/31/2006 Document Revised: 05/20/2013 Document Reviewed: 11/20/2008 °[employer]® Patient Information ©2015 [employer]. This information is not intended to replace advice given to you by your health care provider. Make sure you discuss any questions you have with your health care provider. ° °

## 2013-12-17 ENCOUNTER — Ambulatory Visit (HOSPITAL_COMMUNITY)
Admission: RE | Admit: 2013-12-17 | Payer: Commercial Managed Care - HMO | Source: Ambulatory Visit | Attending: Family Medicine | Admitting: Family Medicine

## 2013-12-17 ENCOUNTER — Ambulatory Visit (HOSPITAL_COMMUNITY): Payer: Commercial Managed Care - HMO | Admitting: Physical Therapy

## 2013-12-18 ENCOUNTER — Telehealth: Payer: Self-pay

## 2013-12-18 NOTE — Telephone Encounter (Signed)
appt made for Friday. Did not want to go back to er

## 2013-12-19 ENCOUNTER — Ambulatory Visit (HOSPITAL_COMMUNITY): Payer: Commercial Managed Care - HMO

## 2013-12-20 ENCOUNTER — Ambulatory Visit (INDEPENDENT_AMBULATORY_CARE_PROVIDER_SITE_OTHER): Payer: Commercial Managed Care - HMO | Admitting: Family Medicine

## 2013-12-20 ENCOUNTER — Encounter: Payer: Self-pay | Admitting: Family Medicine

## 2013-12-20 VITALS — BP 112/64 | HR 74 | Resp 18 | Ht 69.0 in | Wt 150.1 lb

## 2013-12-20 DIAGNOSIS — M549 Dorsalgia, unspecified: Secondary | ICD-10-CM

## 2013-12-20 DIAGNOSIS — I10 Essential (primary) hypertension: Secondary | ICD-10-CM

## 2013-12-20 DIAGNOSIS — G44321 Chronic post-traumatic headache, intractable: Secondary | ICD-10-CM

## 2013-12-20 DIAGNOSIS — F419 Anxiety disorder, unspecified: Secondary | ICD-10-CM

## 2013-12-20 DIAGNOSIS — F418 Other specified anxiety disorders: Secondary | ICD-10-CM

## 2013-12-20 DIAGNOSIS — F329 Major depressive disorder, single episode, unspecified: Secondary | ICD-10-CM

## 2013-12-20 DIAGNOSIS — R55 Syncope and collapse: Secondary | ICD-10-CM

## 2013-12-20 DIAGNOSIS — F17210 Nicotine dependence, cigarettes, uncomplicated: Secondary | ICD-10-CM

## 2013-12-20 DIAGNOSIS — G8929 Other chronic pain: Secondary | ICD-10-CM

## 2013-12-20 DIAGNOSIS — Z23 Encounter for immunization: Secondary | ICD-10-CM | POA: Diagnosis not present

## 2013-12-20 DIAGNOSIS — G44309 Post-traumatic headache, unspecified, not intractable: Secondary | ICD-10-CM | POA: Insufficient documentation

## 2013-12-20 DIAGNOSIS — Z720 Tobacco use: Secondary | ICD-10-CM

## 2013-12-20 MED ORDER — KETOROLAC TROMETHAMINE 60 MG/2ML IM SOLN
60.0000 mg | Freq: Once | INTRAMUSCULAR | Status: AC
  Administered 2013-12-20: 60 mg via INTRAMUSCULAR

## 2013-12-20 MED ORDER — METHYLPREDNISOLONE ACETATE 80 MG/ML IJ SUSP
80.0000 mg | Freq: Once | INTRAMUSCULAR | Status: AC
  Administered 2013-12-20: 80 mg via INTRAMUSCULAR

## 2013-12-20 NOTE — Patient Instructions (Signed)
F/u as before  Injections in office today for headache also prevnar   You are referred for brain scan and to see neurology and cardiology re "passing out " spells  Nicotine is not good for your health and you need to work om QUITTING you will get literature today also

## 2013-12-20 NOTE — Progress Notes (Signed)
   Subjective:    Patient ID: Kelly Douglas, female    DOB: 1955-03-30, 58 y.o.   MRN: [id number]  HPI Recently in ED for abdominal pain , no longer a concern C/o blacking out , just falling on her face for less than 1 minute, has longstanding h/o syncope, often feels hot prior to episode, fell and struck head  Directly 4 days ago, since then, she has had ongoing headache primarily in parietal areas, unrelenting, , wants meds for this and is concerned about degree of pain States back pain much improved with therapy and sleep is betrer with med currently taking , feels much better. Still trying to quit smoking   Review of Systems See HPI   Denies recent fever or chills. Denies sinus pressure, nasal congestion, ear pain or sore throat. Denies chest congestion, productive cough or wheezing. Denies chest pains, palpitations and leg swelling Denies abdominal pain, nausea, vomiting,diarrhea or constipation.   Denies dysuria, frequency, hesitancy or incontinence. Denies joint pain, swelling and limitation in mobility.  Denies uncontrolled depression, anxiety or insomnia. Denies skin break down or rash.        Objective:   Physical Exam BP 112/64 mmHg  Pulse 74  Resp 18  Ht 5\' 9"  (1.753 m)  Wt 150 lb 1.3 oz (68.076 kg)  BMI 22.15 kg/m2  SpO2 96% Patient alert and oriented and in no cardiopulmonary distress.Pt in pain  HEENT: No facial asymmetry, EOMI,   oropharynx pink and moist.  Neck supple no JVD, no mass. PERTL,  Chest: Clear to auscultation bilaterally.Decreased though adequate air entry bilaterally  CVS: S1, S2 no murmurs, no S3.Regular rate.  ABD: Soft non tender.   Ext: No edema  MS: Adequate ROM spine, shoulders, hips and knees.  Skin: Intact, no ulcerations or rash noted.  Psych: Good eye contact, normal affect. Memory intact not anxious or depressed appearing.  CNS: CN 2-12 intact, power,  normal throughout.no focal deficits noted.        Assessment  & Plan:  Headache due to trauma Four day h/o intractable new headache s/p recent fall , 4 days ago, exam shows no neurologic deficit, however c/o secvere pain Injections in office for [pain and refer for brain scan  Syncope Recent syncopal episode causing severe headache. Refer to cardiology and  Nephrology for re eval of long standing recurrent syncope. Has upcoming appts with both specilties  Essential hypertension Controlled, no change in medication   Need for vaccination with 13-polyvalent pneumococcal conjugate vaccine Vaccine administered at visit.   Anxiety and depression Marked improvement on current meds , continue same  Tobacco abuse unchanged Patient counseled for approximately 5 minutes regarding the health risks of ongoing nicotine use, specifically all types of cancer, heart disease, stroke and respiratory failure. The options available for help with cessation ,the behavioral changes to assist the process, and the option to either gradully reduce usage  Or abruptly stop.is also discussed. Pt is also encouraged to set specific goals in number of cigarettes used daily, as well as to set a quit date.

## 2013-12-20 NOTE — Assessment & Plan Note (Addendum)
Four day h/o intractable new headache s/p recent fall , 4 days ago, exam shows no neurologic deficit, however c/o secvere pain Injections in office for [pain and refer for brain scan

## 2013-12-22 NOTE — Assessment & Plan Note (Signed)
unchanged Patient counseled for approximately 5 minutes regarding the health risks of ongoing nicotine use, specifically all types of cancer, heart disease, stroke and respiratory failure. The options available for help with cessation ,the behavioral changes to assist the process, and the option to either gradully reduce usage  Or abruptly stop.is also discussed. Pt is also encouraged to set specific goals in number of cigarettes used daily, as well as to set a quit date.  

## 2013-12-22 NOTE — Assessment & Plan Note (Signed)
Recent syncopal episode causing severe headache. Refer to cardiology and  Nephrology for re eval of long standing recurrent syncope. Has upcoming appts with both specilties

## 2013-12-22 NOTE — Assessment & Plan Note (Signed)
Controlled, no change in medication  

## 2013-12-22 NOTE — Assessment & Plan Note (Signed)
Vaccine administered at visit.  

## 2013-12-22 NOTE — Assessment & Plan Note (Signed)
Improved, good response tho PT also

## 2013-12-22 NOTE — Assessment & Plan Note (Signed)
Marked improvement on current meds , continue same

## 2013-12-24 ENCOUNTER — Ambulatory Visit (HOSPITAL_COMMUNITY)
Admission: RE | Admit: 2013-12-24 | Discharge: 2013-12-24 | Disposition: A | Discharge status: Home / Self Care | Payer: Commercial Managed Care - HMO | Source: Ambulatory Visit | Attending: Family Medicine | Admitting: Family Medicine

## 2013-12-24 DIAGNOSIS — M2560 Stiffness of unspecified joint, not elsewhere classified: Secondary | ICD-10-CM

## 2013-12-24 DIAGNOSIS — Z5189 Encounter for other specified aftercare: Secondary | ICD-10-CM | POA: Insufficient documentation

## 2013-12-24 DIAGNOSIS — M545 Low back pain: Secondary | ICD-10-CM | POA: Diagnosis not present

## 2013-12-24 DIAGNOSIS — Z7409 Other reduced mobility: Secondary | ICD-10-CM

## 2013-12-24 DIAGNOSIS — M25552 Pain in left hip: Secondary | ICD-10-CM | POA: Diagnosis not present

## 2013-12-24 DIAGNOSIS — M5440 Lumbago with sciatica, unspecified side: Secondary | ICD-10-CM

## 2013-12-24 NOTE — Addendum Note (Signed)
Encounter addended by: Leeroy Cha, PT on: 12/24/2013  3:36 PM<BR>     Documentation filed: Clinical Notes

## 2013-12-24 NOTE — Therapy (Signed)
[facility] [address] [place], [place], [address] Phone: [phone]   Fax:  [phone]  Physical Therapy Reassessment  Patient Details  Name: Kelly Douglas MRN: [id number] Date of Birth: 06/26/1955  Encounter Date: 12/24/2013 PHYSICAL THERAPY DISCHARGE SUMMARY  Plan: Patient agrees to discharge.  Patient goals were partially met. Patient is being discharged due to meeting the stated rehab goals.  ?????         PT End of Session - 12/24/13 1528    PT Start Time 1438   PT Stop Time 1524   PT Time Calculation (min) 46 min      Past Medical History  Diagnosis Date  . GERD (gastroesophageal reflux disease)     Dr Gala Romney EGD 09/2009->esophagitis, sm HH, antral erosions, atonic esophagus  . Seasonal allergies   . Arthritis   . Scleroderma   . Chronic lung disease     Chronic scarring and volume loss-left lung; characteristics of a chronic infectious process-possible MAI  . Chronic back pain   . Hypothyroid 1981 approx  . Arteriosclerotic cardiovascular disease (ASCVD) 2013    coronary calcification-left main, LAD and CX; small pericardial effusion  . Tobacco abuse 06/24/2009  . Syncope     Multiple spells over the past 40+ years, likely neurocardiogenic  . Hyperlipidemia   . Depression   . Anxiety   . Chronic bronchitis     Past Surgical History  Procedure Laterality Date  . Tubal ligation    . Bilateral salpingoophorectomy  2013     Dr. Elonda Husky; uterus remains in situ  . Laparoscopic appendectomy  07/16/2011    Procedure: APPENDECTOMY LAPAROSCOPIC;  Surgeon: Donato Heinz, MD;  Location: [facility];  Service: General;  Laterality: N/A;  . Colonoscopy  09/28/09    anal papilla otherwise normal  . Esophagogastroduodenoscopy  10/07/09    Dr. Barrie Dunker of esophageal mucosa, diffusely ?esophagitis (bx benign), small HH/antral erosions, erythema bx benign. 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 Esophagogastroduodenoscopy (egd)  with esophageal dilation N/A 03/07/2012    BWL:[id number], patent, tubular esophagus of uncertain significance-status post biopsy )unremarkable). Hiatal hernia  . Colonoscopy N/A 08/29/2012    JGO:[id number] polyp-removed as described above. tubular adenoma    There were no vitals taken for this visit.  Visit Diagnosis:  Midline low back pain with sciatica, sciatica laterality unspecified  Stiffness due to immobility      Subjective Assessment - 12/24/13 1445    Symptoms Kelly Douglas states she missed the last week secondary to being very sick.  She went to the ER and has been in bed.  PT states she is feelong better as far as her back is concerned.    How long can you sit comfortably? Able to sit for an hour was 10 minutes    How long can you stand comfortably? Able to stand for 20- 30 minutes was 10-15    How long can you walk comfortably? Pt is not walking    Currently in Pain? Yes  worst 6/10; best 0   Pain Score 1    Pain Location Back   Pain Orientation Left   Pain Descriptors / Indicators Aching   Pain Type Chronic pain          [facility] PT Assessment - 12/24/13 1450    Assessment   Medical Diagnosis LBP   Onset Date --  chronic   Next MD Visit [place] 12/30/13   Observation/Other Assessments   Focus on Therapeutic Outcomes (FOTO)  foto 59   AROM   Lumbar Flexion wnl   Lumbar Extension wnl was decreased 20%   Lumbar - Right Side Bend wnl   Lumbar - Left Side Bend wnl    Lumbar - Right Rotation wnl was decreased 20%   Lumbar - Left Rotation wnl was decreased 20%    Strength   Right Hip Flexion 5/5  was 4-/5   Right Hip Extension --  5-/5 was 3/5   Right Hip ABduction 5/5  was 4/5   Left Hip Flexion 5/5  was 4-/5    Left Hip Extension 4/5   Left Hip ABduction 5/5  was 4/5   Right Knee Flexion 4/5  was 3+/5   Left Knee Flexion 5/5  was 3+5   Flexibility   Hamstrings Rt 150; LT 165          [facility] Adult PT Treatment/Exercise - 12/24/13 0001    Exercises    Exercises Lumbar   Lumbar Exercises: Stretches   Active Hamstring Stretch 3 reps;30 seconds   Active Hamstring Stretch Limitations supine   Quad Stretch 1 rep;30 seconds   Lumbar Exercises: Standing   Other Standing Lumbar Exercises 3D Hip Excursion x10   Lumbar Exercises: Prone   Straight Leg Raise 10 reps   Opposite Arm/Leg Raise 10 reps      Reviewed body mechanics and bed mobility as well as the benefits of walking.       PT Short Term Goals - 12/24/13 1506    PT SHORT TERM GOAL #1   Title Pt will report pain to be decreased to < 5/10.    Status Achieved   PT SHORT TERM GOAL #2   Title Pt will be able to sit for 15 minutes in comfort to be able to eat a small meal.    Time 2   Period Weeks   PT SHORT TERM GOAL #3   Title Pt will be able to walk for 15 minutes without increased pain to be able to complete short shopping trips.    Time 2   Period Weeks   Status Achieved   PT SHORT TERM GOAL #4   Title Pt will demonstrate increased hip extensor strength to 4/5 to allow pt to come to sit (I)   Time 2   Period Weeks   Status Achieved   PT SHORT TERM GOAL #5   Title Pt will be able to verbalize the importance of posture in care of back.    Time 2   Period Weeks   Status Achieved          PT Long Term Goals - 12/24/13 1507    PT LONG TERM GOAL #1   Title Pt will be (I) with HEP   Time 4   Period Weeks   Status Achieved   PT LONG TERM GOAL #2   Title Pt will be able to sit for 30 minutes without pain to be able to watch a tv show in comfort.    Time 4   Period Weeks   Status Achieved   PT LONG TERM GOAL #3   Title Pt will be able to walk for 20-30 minutes 4x/wk for better health habits.    Time 4   Period Weeks   Status Achieved          Plan - 12/24/13 1529    Clinical Impression Statement Pt reassessed with all short term and all long term goals being met.  Pt  feels she is comfortable completing exercises on own at home at this point.    Rehab  Potential Good   PT Home Exercise Plan given   PT Plan discharge pt to HEP           G-Codes - 2013/12/25 1531    Functional Assessment Tool Used foto/clinical judgement   Functional Limitation Other PT primary   Other PT Primary Goal Status (B5670) At least 20 percent but less than 40 percent impaired, limited or restricted   Other PT Primary Discharge Status (L4103) At least 60 percent but less than 80 percent impaired, limited or restricted         Problem List Patient Active Problem List   Diagnosis Date Noted  . Headache due to trauma 12/20/2013  . Need for vaccination with 13-polyvalent pneumococcal conjugate vaccine 12/20/2013  . Encounter for annual physical exam 11/11/2013  . Special screening for malignant neoplasms, colon 11/11/2013  . Stiffness due to immobility 10/31/2013  . Nicotine dependence 09/17/2013  . Hoarseness, persistent 09/16/2013  . Need for prophylactic vaccination and inoculation against influenza 09/16/2013  . Pain in joint, shoulder region 06/22/2013  . Wrist pain, right 06/22/2013  . Cervical pain (neck) 06/21/2013  . Back pain with radiation 06/21/2013  . Back pain 04/03/2013  . Right sided abdominal pain 08/10/2012  . Heme positive stool 08/10/2012  . IDA (iron deficiency anemia) 08/10/2012  . Left-sided epistaxis 06/01/2012  . Leg cramps 04/25/2012  . Diarrhea 04/25/2012  . Microcytosis 04/25/2012  . Globus sensation 02/28/2012  . Essential hypertension 02/22/2012  . Cardiorenal syndrome 02/02/2012  . Insomnia 05/09/2011  . Laboratory test 05/05/2011  . GERD (gastroesophageal reflux disease)   . Scleroderma   . Chronic lung disease   . Hypothyroid   . Arteriosclerotic cardiovascular disease (ASCVD)   . Syncope   . Anxiety and depression 06/24/2009  . Tobacco abuse 06/24/2009  . Chronic back pain 06/24/2009  . VITAMIN D DEFICIENCY 03/18/2009  . Hyperlipidemia 03/18/2009  Azucena Freed PT/CLT [phone]  December 25, 2013, 3:33  PM

## 2013-12-24 NOTE — Patient Instructions (Signed)
On Elbows (Prone)   Rise up on elbows as high as possible, keeping hips on floor. Hold ____ seconds. Repeat ____ times per set. Do ____ sets per session. Do ____ sessions per day.  http://orth.exer.us/92   Copyright  VHI. All rights reserved.  Backward Bend (Standing)   Arch backward to make hollow of back deeper. Hold ____ seconds. Repeat ____ times per set. Do ____ sets per session. Do ____ sessions per day.  http://orth.exer.us/179   Copyright  VHI. All rights reserved.  Lifting Principles .Maintain proper posture and head alignment. .Slide object as close as possible before lifting. .Move obstacles out of the way. .Test before lifting; ask for help if too heavy. .Tighten stomach muscles without holding breath. .Use smooth movements; do not jerk. .Use legs to do the work, and pivot with feet. .Distribute the work load symmetrically and close to the center of trunk. .Push instead of pull whenever possible.  Copyright  VHI. All rights reserved.  Hip Extension (Prone)   Lift left leg ____ inches from floor, keeping knee locked. Repeat ____ times per set. Do ____ sets per session. Do ____ sessions per day.  http://orth.exer.us/99   Copyright  VHI. All rights reserved.  Press Up (Extension)   Place hands in a position for a half push-up. Press top half of body upward using arms. Let lower back sag. Hold ____ seconds. Lower body. Repeat ____ times. Do ____ sessions per day.  http://gt2.exer.us/246   Copyright  VHI. All rights reserved.

## 2013-12-25 ENCOUNTER — Other Ambulatory Visit: Payer: Self-pay | Admitting: Family Medicine

## 2013-12-25 ENCOUNTER — Ambulatory Visit (HOSPITAL_COMMUNITY)
Admission: RE | Admit: 2013-12-25 | Discharge: 2013-12-25 | Disposition: A | Discharge status: Home / Self Care | Payer: Commercial Managed Care - HMO | Source: Ambulatory Visit | Attending: Family Medicine | Admitting: Family Medicine

## 2013-12-25 DIAGNOSIS — G44321 Chronic post-traumatic headache, intractable: Secondary | ICD-10-CM

## 2013-12-25 DIAGNOSIS — R51 Headache: Secondary | ICD-10-CM | POA: Insufficient documentation

## 2013-12-25 DIAGNOSIS — R42 Dizziness and giddiness: Secondary | ICD-10-CM | POA: Diagnosis not present

## 2013-12-26 ENCOUNTER — Ambulatory Visit (HOSPITAL_COMMUNITY): Payer: Commercial Managed Care - HMO | Admitting: Physical Therapy

## 2013-12-31 ENCOUNTER — Ambulatory Visit (HOSPITAL_COMMUNITY): Payer: Commercial Managed Care - HMO | Admitting: Physical Therapy

## 2014-01-02 ENCOUNTER — Ambulatory Visit (HOSPITAL_COMMUNITY): Payer: Commercial Managed Care - HMO | Admitting: Physical Therapy

## 2014-01-16 ENCOUNTER — Encounter: Payer: Medicare HMO | Admitting: Family Medicine

## 2014-02-05 ENCOUNTER — Ambulatory Visit (INDEPENDENT_AMBULATORY_CARE_PROVIDER_SITE_OTHER): Payer: Commercial Managed Care - HMO | Admitting: Cardiology

## 2014-02-05 ENCOUNTER — Encounter: Payer: Self-pay | Admitting: Cardiology

## 2014-02-05 VITALS — BP 118/74 | HR 68 | Ht 67.0 in | Wt 151.0 lb

## 2014-02-05 DIAGNOSIS — E785 Hyperlipidemia, unspecified: Secondary | ICD-10-CM

## 2014-02-05 DIAGNOSIS — R55 Syncope and collapse: Secondary | ICD-10-CM

## 2014-02-05 DIAGNOSIS — I2510 Atherosclerotic heart disease of native coronary artery without angina pectoris: Secondary | ICD-10-CM

## 2014-02-05 DIAGNOSIS — I10 Essential (primary) hypertension: Secondary | ICD-10-CM | POA: Diagnosis not present

## 2014-02-05 NOTE — Progress Notes (Signed)
Clinical Summary Kelly Douglas is a 59 y.o.female seen today for follow up of the following medical problems.  1. CAD  - non-obstructive CAD by notes, it appears this was noted by CT scan 03/2011. Does not appear she has had a cath or stress test. 04/2011 LVEF 55-60%.  - denies any chest pain. Chronic SOB at 1/4 mile which stable.   2. COPD  - compliant with inhalers   3. Scleroderma  -echo Jan 2015 could not measure PASP, normal RV size and function   4. Hyperlipidemia  - last lipid panel 08/2013 TC 102 TG 95 HDL 32 LDL 51 - compliant with statin  5. Syncope - started as a young child. Can occur every 2-3 months.  - typically occurs with standing. Can feel very hot, vision starts getting dark. Can feel lightheaded/dizzy with standing. Can also occur with looking upward. Gets better with sitting down. No palpitations. No chest pain. Though when she comes too can feel heart racing. Stress can trigger at times - coffee x 3 cups, occasional tea, Dr pepper x 2-3, no energy drinks, no EtoH. 2 bottles of water.  - no tongue biting, no bowel or bladder incontinence.  - last episode was 12/15/2013.   6. HTN - checks at home regularly, SBPs typically in 120s Past Medical History  Diagnosis Date  . GERD (gastroesophageal reflux disease)     Dr Gala Romney EGD 09/2009->esophagitis, sm HH, antral erosions, atonic esophagus  . Seasonal allergies   . Arthritis   . Scleroderma   . Chronic lung disease     Chronic scarring and volume loss-left lung; characteristics of a chronic infectious process-possible MAI  . Chronic back pain   . Hypothyroid 1981 approx  . Arteriosclerotic cardiovascular disease (ASCVD) 2013    coronary calcification-left main, LAD and CX; small pericardial effusion  . Tobacco abuse 06/24/2009  . Syncope     Multiple spells over the past 40+ years, likely neurocardiogenic  . Hyperlipidemia   . Depression   . Anxiety   . Chronic bronchitis      No Known  Allergies   Current Outpatient Prescriptions  Medication Sig Dispense Refill  . acetaminophen (TYLENOL) 500 MG tablet Take 1,000 mg by mouth every 6 (six) hours as needed for pain.    Marland Kitchen albuterol (PROVENTIL HFA;VENTOLIN HFA) 108 (90 BASE) MCG/ACT inhaler Inhale 2 puffs into the lungs every 6 (six) hours as needed. Shortness of breath 6.7 g 3  . amLODipine (NORVASC) 5 MG tablet TAKE 1 TABLET BY MOUTH EVERY DAY (Patient taking differently: Take 5 mg by mouth daily. TAKE 1 TABLET BY MOUTH EVERY DAY) 90 tablet 1  . aspirin (ASPIRIN EC) 81 MG EC tablet Take 81 mg by mouth daily. Swallow whole.    . busPIRone (BUSPAR) 5 MG tablet Take 1 tablet (5 mg total) by mouth 3 (three) times daily. 270 tablet 1  . DULoxetine (CYMBALTA) 30 MG capsule Take 60 mg by mouth daily.    . ferrous sulfate 325 (65 FE) MG EC tablet Take 1 tablet (325 mg total) by mouth 2 (two) times daily. 60 tablet 3  . gabapentin (NEURONTIN) 600 MG tablet Take 600 mg by mouth 3 (three) times daily.    Marland Kitchen levothyroxine (SYNTHROID, LEVOTHROID) 75 MCG tablet TAKE 1 TABLET BY MOUTH EVERY DAY (Patient taking differently: Take 75 mcg by mouth daily. TAKE 1 TABLET BY MOUTH EVERY DAY) 90 tablet 1  . Multiple Vitamins-Calcium (ONE-A-DAY WOMENS PO) Take 1 tablet  by mouth daily.    . ondansetron (ZOFRAN ODT) 4 MG disintegrating tablet Take 1 tablet (4 mg total) by mouth every 8 (eight) hours as needed for nausea or vomiting. 20 tablet 0  . pantoprazole (PROTONIX) 40 MG tablet TAKE 1 TABLET BY MOUTH EVERY DAY (Patient taking differently: Take 40 mg by mouth daily. ) 90 tablet 1  . pravastatin (PRAVACHOL) 40 MG tablet TAKE 1 TABLET BY MOUTH EVERY DAY (Patient taking differently: Take 40 mg by mouth daily. ) 90 tablet 1  . traMADol (ULTRAM) 50 MG tablet Take 50 mg by mouth every 6 (six) hours as needed. 1-2    . traZODone (DESYREL) 100 MG tablet TAKE 1 TABLET BY MOUTH EVERY NIGHT AT BEDTIME 30 tablet 3   No current facility-administered medications for  this visit.     Past Surgical History  Procedure Laterality Date  . Tubal ligation    . Bilateral salpingoophorectomy  2013     Dr. Elonda Husky; uterus remains in situ  . Laparoscopic appendectomy  07/16/2011    Procedure: APPENDECTOMY LAPAROSCOPIC;  Surgeon: Donato Heinz, MD;  Location: [facility];  Service: General;  Laterality: N/A;  . Colonoscopy  09/28/09    anal papilla otherwise normal  . Esophagogastroduodenoscopy  10/07/09    Dr. Barrie Dunker of esophageal mucosa, diffusely ?esophagitis (bx benign), small HH/antral erosions, erythema bx benign. 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 Esophagogastroduodenoscopy (egd) with esophageal dilation N/A 03/07/2012    ZOX:[id number], patent, tubular esophagus of uncertain significance-status post biopsy )unremarkable). Hiatal hernia  . Colonoscopy N/A 08/29/2012    JWJ:[id number] polyp-removed as described above. tubular adenoma     No Known Allergies    Family History  Problem Relation Age of Onset  . Throat cancer Brother   . Stroke Father   . Coronary artery disease Brother   . Down syndrome Brother   . Hypertension Brother     father/mother  . Anesthesia problems Neg Hx   . Hypotension Neg Hx   . Malignant hyperthermia Neg Hx   . Pseudochol deficiency Neg Hx   . Colon cancer Neg Hx      Social History Kelly Douglas reports that she has been smoking Cigarettes.  She has a 40 pack-year smoking history. She has never used smokeless tobacco. Kelly Douglas reports that she does not drink alcohol.   Review of Systems CONSTITUTIONAL: No weight loss, fever, chills, weakness or fatigue.  HEENT: Eyes: No visual loss, blurred vision, double vision or yellow sclerae.No hearing loss, sneezing, congestion, runny nose or sore throat.  SKIN: No rash or itching.  CARDIOVASCULAR: per HPI RESPIRATORY: No shortness of breath, cough or sputum.  GASTROINTESTINAL: No anorexia, nausea, vomiting or diarrhea. No abdominal pain or blood.    GENITOURINARY: No burning on urination, no polyuria NEUROLOGICAL: per HPI MUSCULOSKELETAL: No muscle, back pain, joint pain or stiffness.  LYMPHATICS: No enlarged nodes. No history of splenectomy.  PSYCHIATRIC: No history of depression or anxiety.  ENDOCRINOLOGIC: No reports of sweating, cold or heat intolerance. No polyuria or polydipsia.  Marland Kitchen   Physical Examination p 68 bp 118/74 Wt 151 lbs BMI 24 Gen: resting comfortably, no acute distress HEENT: no scleral icterus, pupils equal round and reactive, no palptable cervical adenopathy,  CV: RRR, no m/r/g, no JVD Resp: Clear to auscultation bilaterally GI: abdomen is soft, non-tender, non-distended, normal bowel sounds, no hepatosplenomegaly MSK: extremities are warm, no edema.  Skin: warm, no rash Neuro:  no focal deficits Psych: appropriate affect  Assessment and Plan  1. CAD - no current symptoms, continue risk factor modification  2.. Scleroderma - no evidence of significant pulm HTN by prior echo, continue to monitor  3. Hyperlipidemia - at goal, continue current meds  4. HTN - at goal, continue current meds  5. Syncope - long history of syncope, unclear etiology - negative orthostatics in clinic. Will obtain 30 day event monitor  F/u 6 weeks    Arnoldo Lenis, M.D.

## 2014-02-05 NOTE — Patient Instructions (Signed)
Your physician wants you to follow-up in: 6 months. You will receive a reminder letter in the mail two months in advance. If you don't receive a letter, please call our office to schedule the follow-up appointment.  Your physician recommends that you continue on your current medications as directed. Please refer to the Current Medication list given to you today.  Your physician has recommended that you wear an event monitor. Event monitors are medical devices that record the heart's electrical activity. Doctors most often [place] these monitors to diagnose arrhythmias. Arrhythmias are problems with the speed or rhythm of the heartbeat. The monitor is a small, portable device. You can wear one while you do your normal daily activities. This is usually used to diagnose what is causing palpitations/syncope (passing out).  Thank you for choosing [facility]!

## 2014-02-20 NOTE — Addendum Note (Signed)
Addended by: Levonne Hubert on: 02/20/2014 12:50 PM   Modules accepted: Orders

## 2014-03-19 ENCOUNTER — Other Ambulatory Visit: Payer: Self-pay | Admitting: Family Medicine

## 2014-03-19 DIAGNOSIS — Z1231 Encounter for screening mammogram for malignant neoplasm of breast: Secondary | ICD-10-CM

## 2014-03-25 ENCOUNTER — Other Ambulatory Visit: Payer: Self-pay | Admitting: Family Medicine

## 2014-04-01 DIAGNOSIS — M519 Unspecified thoracic, thoracolumbar and lumbosacral intervertebral disc disorder: Secondary | ICD-10-CM | POA: Diagnosis not present

## 2014-04-01 DIAGNOSIS — G5600 Carpal tunnel syndrome, unspecified upper limb: Secondary | ICD-10-CM | POA: Diagnosis not present

## 2014-04-01 DIAGNOSIS — R55 Syncope and collapse: Secondary | ICD-10-CM | POA: Diagnosis not present

## 2014-04-01 DIAGNOSIS — M533 Sacrococcygeal disorders, not elsewhere classified: Secondary | ICD-10-CM | POA: Diagnosis not present

## 2014-04-03 ENCOUNTER — Other Ambulatory Visit: Payer: Self-pay | Admitting: Family Medicine

## 2014-04-14 ENCOUNTER — Ambulatory Visit (HOSPITAL_COMMUNITY)
Admission: RE | Admit: 2014-04-14 | Discharge: 2014-04-14 | Disposition: A | Discharge status: Home / Self Care | Payer: Commercial Managed Care - HMO | Source: Ambulatory Visit | Attending: Family Medicine | Admitting: Family Medicine

## 2014-04-14 DIAGNOSIS — Z1231 Encounter for screening mammogram for malignant neoplasm of breast: Secondary | ICD-10-CM | POA: Insufficient documentation

## 2014-04-23 ENCOUNTER — Encounter: Payer: Self-pay | Admitting: Family Medicine

## 2014-04-23 ENCOUNTER — Ambulatory Visit (INDEPENDENT_AMBULATORY_CARE_PROVIDER_SITE_OTHER): Payer: Commercial Managed Care - HMO | Admitting: Family Medicine

## 2014-04-23 VITALS — BP 124/66 | HR 72 | Resp 18 | Ht 67.0 in | Wt 151.0 lb

## 2014-04-23 DIAGNOSIS — Z720 Tobacco use: Secondary | ICD-10-CM

## 2014-04-23 DIAGNOSIS — Z0000 Encounter for general adult medical examination without abnormal findings: Secondary | ICD-10-CM

## 2014-04-23 DIAGNOSIS — I10 Essential (primary) hypertension: Secondary | ICD-10-CM

## 2014-04-23 DIAGNOSIS — E785 Hyperlipidemia, unspecified: Secondary | ICD-10-CM

## 2014-04-23 DIAGNOSIS — E038 Other specified hypothyroidism: Secondary | ICD-10-CM

## 2014-04-23 DIAGNOSIS — R7302 Impaired glucose tolerance (oral): Secondary | ICD-10-CM | POA: Diagnosis not present

## 2014-04-23 NOTE — Assessment & Plan Note (Addendum)

## 2014-04-23 NOTE — Patient Instructions (Addendum)
F/u in 4.5 month, call if you need me before   Please get labs today, lipid, cmp, HBA1C and tSH  Please continue to cut back on cigarettes , so that you DO QUIT  Continue to work on commitment to regular exercise for health, and eating healthily  Thanks for choosing [facility], we consider it a privelige to serve you.   Need to follow up on eye exam and advanced directives as discussed

## 2014-04-23 NOTE — Progress Notes (Signed)
Subjective:    Patient ID: Kelly Douglas, female    DOB: July 29, 1955, 59 y.o.   MRN: [id number]  HPI  Preventive Screening-Counseling & Management   Patient present here today for a Medicare annual wellness visit.   Current Problems (verified)   Medications Prior to Visit Allergies (verified)   PAST HISTORY  Family History (updated)  Social History Disabled Engineer, manufacturing systems    Risk Factors  Current exercise habits:  Does stretching exercises and walks as the weather allows   Dietary issues discussed:  Most meals are prepared at home.    Cardiac risk factors: htn , nicotine  Depression Screen  (Note: if answer to either of the following is "Yes", a more complete depression screening is indicated)   Over the past two weeks, have you felt down, depressed or hopeless? No  Over the past two weeks, have you felt little interest or pleasure in doing things? No  Have you lost interest or pleasure in daily life? No  Do you often feel hopeless? No  Do you cry easily over simple problems? No   Activities of Daily Living  In your present state of health, do you have any difficulty performing the following activities?  Driving?: Yes due to problems with syncope Managing money?: No Feeding yourself?:No Getting from bed to chair?:No Climbing a flight of stairs?: Yes due to back pain  Preparing food and eating?:No Bathing or showering?:No Getting dressed?:No Getting to the toilet?:No Using the toilet?:No Moving around from place to place?: Yes at times due to back pain  Fall Risk Assessment In the past year have you fallen or had a near fall?: Yes Are you currently taking any medications that make you dizzy?:No   Hearing Difficulties: No Do you often ask people to speak up or repeat themselves?:No Do you experience ringing or noises in your ears?:No Do you have difficulty understanding soft or whispered voices?:No  Cognitive Testing  Alert? Yes Normal Appearance?Yes    Oriented to person? Yes Place? Yes  Time? Yes  Displays appropriate judgment?Yes  Can read the correct time from a watch face? yes Are you having problems remembering things?No  Advanced Directives have been discussed with the patient?Yes and brochure provided ,full code   List the Names of Other Physician/Practitioners you currently use: careteams updated    Indicate any recent Medical Services you may have received from other than [facility] providers in the past year (date may be approximate).   Assessment:    Annual Wellness Exam   Plan:      Medicare Attestation  I have personally reviewed:  The patient's medical and social history  Their use of alcohol, tobacco or illicit drugs  Their current medications and supplements  The patient's functional ability including ADLs,fall risks, home safety risks, cognitive, and hearing and visual impairment  Diet and physical activities  Evidence for depression or mood disorders  The patient's weight, height, BMI, and visual acuity have been recorded in the chart. I have made referrals, counseling, and provided education to the patient based on review of the above and I have provided the patient with a written personalized care plan for preventive services.     Review of Systems     Objective:   Physical Exam  BP 124/66 mmHg  Pulse 72  Resp 18  Ht 5\' 7"  (1.702 m)  Wt 151 lb (68.493 kg)  BMI 23.64 kg/m2  SpO2 98%       Assessment & Plan:  Medicare annual wellness visit, subsequent Annual exam as documented. Counseling done  re healthy lifestyle involving commitment to 150 minutes exercise per week, heart healthy diet, and attaining healthy weight.The importance of adequate sleep also discussed. Regular seat belt use and home safety, is also discussed. Changes in health habits are decided on by the patient with goals and time frames  set for achieving them. Immunization and cancer screening needs are specifically addressed  at this visit.           Tobacco abuse Patient counseled for approximately 5 minutes regarding the health risks of ongoing nicotine use, specifically all types of cancer, heart disease, stroke and respiratory failure. The options available for help with cessation ,the behavioral changes to assist the process, and the option to either gradully reduce usage  Or abruptly stop.is also discussed. Pt is also encouraged to set specific goals in number of cigarettes used daily, as well as to set a quit date.  Number of cigarettes/cigars currently smoking daily: 10

## 2014-04-24 LAB — COMPREHENSIVE METABOLIC PANEL
ALK PHOS: 96 U/L (ref 39–117)
ALT: 13 U/L (ref 0–35)
AST: 14 U/L (ref 0–37)
Albumin: 4 g/dL (ref 3.5–5.2)
BILIRUBIN TOTAL: 0.4 mg/dL (ref 0.2–1.2)
BUN: 5 mg/dL — ABNORMAL LOW (ref 6–23)
CHLORIDE: 105 meq/L (ref 96–112)
CO2: 28 mEq/L (ref 19–32)
Calcium: 9.3 mg/dL (ref 8.4–10.5)
Creat: 0.8 mg/dL (ref 0.50–1.10)
Glucose, Bld: 83 mg/dL (ref 70–99)
Potassium: 4.5 mEq/L (ref 3.5–5.3)
Sodium: 140 mEq/L (ref 135–145)
TOTAL PROTEIN: 6.8 g/dL (ref 6.0–8.3)

## 2014-04-24 LAB — LIPID PANEL
Cholesterol: 108 mg/dL (ref 0–200)
HDL: 37 mg/dL — ABNORMAL LOW (ref >=46)
LDL Cholesterol: 57 mg/dL (ref 0–99)
Total CHOL/HDL Ratio: 2.9 Ratio
Triglycerides: 70 mg/dL (ref <150)
VLDL: 14 mg/dL (ref 0–40)

## 2014-04-24 LAB — HEMOGLOBIN A1C
Hgb A1c MFr Bld: 5.8 % — ABNORMAL HIGH (ref <5.7)
Mean Plasma Glucose: 120 mg/dL — ABNORMAL HIGH (ref <117)

## 2014-04-24 LAB — TSH: TSH: 0.16 u[IU]/mL — ABNORMAL LOW (ref 0.350–4.500)

## 2014-04-25 NOTE — Assessment & Plan Note (Signed)

## 2014-04-28 ENCOUNTER — Telehealth: Payer: Self-pay | Admitting: Family Medicine

## 2014-04-28 NOTE — Addendum Note (Signed)
Addended by: Denman George B on: 04/28/2014 05:05 PM   Modules accepted: Orders

## 2014-04-28 NOTE — Telephone Encounter (Signed)
Kelly Sousa, [name] is calling requesting Lab Results, Please advise?

## 2014-04-29 NOTE — Telephone Encounter (Signed)
Patient aware of results.

## 2014-07-01 DIAGNOSIS — M519 Unspecified thoracic, thoracolumbar and lumbosacral intervertebral disc disorder: Secondary | ICD-10-CM | POA: Diagnosis not present

## 2014-07-01 DIAGNOSIS — M533 Sacrococcygeal disorders, not elsewhere classified: Secondary | ICD-10-CM | POA: Diagnosis not present

## 2014-07-01 DIAGNOSIS — R55 Syncope and collapse: Secondary | ICD-10-CM | POA: Diagnosis not present

## 2014-07-01 DIAGNOSIS — G5600 Carpal tunnel syndrome, unspecified upper limb: Secondary | ICD-10-CM | POA: Diagnosis not present

## 2014-07-02 ENCOUNTER — Encounter: Payer: Self-pay | Admitting: Internal Medicine

## 2014-07-15 ENCOUNTER — Telehealth: Payer: Self-pay | Admitting: *Deleted

## 2014-07-15 DIAGNOSIS — I2510 Atherosclerotic heart disease of native coronary artery without angina pectoris: Secondary | ICD-10-CM

## 2014-07-15 DIAGNOSIS — K219 Gastro-esophageal reflux disease without esophagitis: Secondary | ICD-10-CM

## 2014-07-15 NOTE — Telephone Encounter (Signed)
Pt called stating her insurance requires her to have a referral for her yearly check up with her gastro and heart doctor. Please advise pt is requesting referrals to each doctor.

## 2014-07-22 NOTE — Telephone Encounter (Signed)
Referral to Dr Sydell Axon for [facility] and  to card for CAD, pls enter I will sign, let pt know also

## 2014-07-29 NOTE — Telephone Encounter (Signed)
referrals entered

## 2014-07-29 NOTE — Addendum Note (Signed)
Addended by: Eual Fines on: 07/29/2014 01:57 PM   Modules accepted: Orders

## 2014-08-11 ENCOUNTER — Ambulatory Visit (INDEPENDENT_AMBULATORY_CARE_PROVIDER_SITE_OTHER): Payer: Commercial Managed Care - HMO | Admitting: Gastroenterology

## 2014-08-11 ENCOUNTER — Encounter: Payer: Self-pay | Admitting: Gastroenterology

## 2014-08-11 VITALS — BP 103/68 | HR 100 | Temp 97.8°F | Ht 68.0 in | Wt 142.8 lb

## 2014-08-11 DIAGNOSIS — K219 Gastro-esophageal reflux disease without esophagitis: Secondary | ICD-10-CM

## 2014-08-11 MED ORDER — PANTOPRAZOLE SODIUM 40 MG PO TBEC: 40.0000 mg | DELAYED_RELEASE_TABLET | Freq: Every day | ORAL | Status: DC

## 2014-08-11 MED ORDER — DICYCLOMINE HCL 10 MG PO CAPS: 10.0000 mg | ORAL_CAPSULE | Freq: Three times a day (TID) | ORAL | Status: DC | PRN

## 2014-08-11 NOTE — Patient Instructions (Signed)
I have refilled Protonix and Bentyl.   Please call us if you need anything.  We will see you in 1 year!

## 2014-08-11 NOTE — Progress Notes (Signed)
Primary Care Physician:  Tula Nakayama, MD  Primary GI: Dr. Gala Romney   Chief Complaint  Patient presents with  . Follow-up    HPI:   Kelly Douglas is a 59 y.o. female presenting today with a history of GERD and diarrhea. Next colonoscopy due 2021. Historically has used Bentyl prn for loose stool. Felt to have lactose-intolerance. Here for routine 1 year follow-up,.   States when she drinks city water gets bloated, diarrhea, sick. Drinking bottled water is ok. Able to drink 1 glass of milk and ok. Ice cream and cheese without problems. Would like a refill on Bentyl. Very rare that she has to take this. Will sometimes take Protonix twice a day.   Down 26 lbs in past year. States not a lot of food in the house. Husband has to see specialist. Finances a concern.     Past Medical History  Diagnosis Date  . GERD (gastroesophageal reflux disease)     Dr Gala Romney EGD 09/2009->esophagitis, sm HH, antral erosions, atonic esophagus  . Seasonal allergies   . Arthritis   . Scleroderma   . Chronic lung disease     Chronic scarring and volume loss-left lung; characteristics of a chronic infectious process-possible MAI  . Chronic back pain   . Hypothyroid 1981 approx  . Arteriosclerotic cardiovascular disease (ASCVD) 2013    coronary calcification-left main, LAD and CX; small pericardial effusion  . Tobacco abuse 06/24/2009  . Syncope     Multiple spells over the past 40+ years, likely neurocardiogenic  . Hyperlipidemia   . Depression   . Anxiety   . Chronic bronchitis     Past Surgical History  Procedure Laterality Date  . Tubal ligation    . Bilateral salpingoophorectomy  2013     Dr. Elonda Husky; uterus remains in situ  . Laparoscopic appendectomy  07/16/2011    Procedure: APPENDECTOMY LAPAROSCOPIC;  Surgeon: Donato Heinz, MD;  Location: [facility];  Service: General;  Laterality: N/A;  . Colonoscopy  09/28/09    anal papilla otherwise normal  . Esophagogastroduodenoscopy  10/07/09    Dr.  Barrie Dunker of esophageal mucosa, diffusely ?esophagitis (bx benign), small HH/antral erosions, erythema bx benign. 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 Esophagogastroduodenoscopy (egd) with esophageal dilation N/A 03/07/2012    [id number], patent, tubular esophagus of uncertain significance-status post biopsy )unremarkable). Hiatal hernia  . Colonoscopy N/A 08/29/2012    [id number] polyp-removed as described above. tubular adenoma    Current Outpatient Prescriptions  Medication Sig Dispense Refill  . acetaminophen (TYLENOL) 500 MG tablet Take 1,000 mg by mouth every 6 (six) hours as needed for pain.    Marland Kitchen albuterol (PROVENTIL HFA;VENTOLIN HFA) 108 (90 BASE) MCG/ACT inhaler Inhale 2 puffs into the lungs every 6 (six) hours as needed. Shortness of breath 6.7 g 3  . amLODipine (NORVASC) 5 MG tablet TAKE 1 TABLET EVERY DAY 90 tablet 1  . aspirin (ASPIRIN EC) 81 MG EC tablet Take 81 mg by mouth daily. Swallow whole.    . busPIRone (BUSPAR) 5 MG tablet TAKE 1 TABLET THREE TIMES DAILY 270 tablet 1  . DULoxetine (CYMBALTA) 30 MG capsule Take 60 mg by mouth daily.    . ferrous sulfate 325 (65 FE) MG EC tablet Take 1 tablet (325 mg total) by mouth 2 (two) times daily. 60 tablet 3  . gabapentin (NEURONTIN) 600 MG tablet Take 600 mg by mouth 3 (three) times daily.    Marland Kitchen levothyroxine (SYNTHROID, LEVOTHROID) 75 MCG tablet TAKE 1 TABLET  EVERY DAY 90 tablet 1  . Multiple Vitamins-Calcium (ONE-A-DAY WOMENS PO) Take 1 tablet by mouth daily.    . ondansetron (ZOFRAN ODT) 4 MG disintegrating tablet Take 1 tablet (4 mg total) by mouth every 8 (eight) hours as needed for nausea or vomiting. 20 tablet 0  . pantoprazole (PROTONIX) 40 MG tablet TAKE 1 TABLET EVERY DAY 90 tablet 1  . pravastatin (PRAVACHOL) 40 MG tablet TAKE 1 TABLET EVERY DAY 90 tablet 1  . traMADol (ULTRAM) 50 MG tablet Take 50 mg by mouth every 6 (six) hours as needed. 1-2    . traZODone (DESYREL) 100 MG tablet TAKE 1 TABLET EVERY  NIGHT AT BEDTIME 90 tablet 1   No current facility-administered medications for this visit.    Allergies as of 08/11/2014  . (No Known Allergies)    Family History  Problem Relation Age of Onset  . Stroke Father   . Aneurysm Father   . Coronary artery disease Brother   . Hypertension Brother   . Down syndrome Brother   . Hypertension Brother   . Hypertension Brother     father/mother  . Anesthesia problems Neg Hx   . Hypotension Neg Hx   . Malignant hyperthermia Neg Hx   . Pseudochol deficiency Neg Hx   . Colon cancer Neg Hx   . Aneurysm Mother   . Throat cancer Brother 49    throat cancer  . Hypertension Sister   . Diabetes Brother   . Diabetes Brother     History   Social History  . Marital Status: Married    Spouse Name: N/A  . Number of Children: 2  . Years of Education: N/A   Occupational History  . disabled    Social History Main Topics  . Smoking status: Current Every Day Smoker -- 0.75 packs/day for 40 years    Types: Cigarettes    Start date: 07/14/1971  . Smokeless tobacco: Never Used     Comment: Smokes about a pack of cigarettes a day  . Alcohol Use: No     Comment: quit June 2012-used to drink 6 pack daily  . Drug Use: No  . Sexual Activity: Yes    Birth Control/ Protection: Post-menopausal   Other Topics Concern  . None   Social History Narrative    Review of Systems: As mentioned in HPI  Physical Exam: BP 103/68 mmHg  Pulse 100  Temp(Src) 97.8 F (36.6 C) (Oral)  Ht '5\' 8"'$  (1.727 m)  Wt 142 lb 12.8 oz (64.774 kg)  BMI 21.72 kg/m2 General:   Alert and oriented. No distress noted. Pleasant and cooperative.  Head:  Normocephalic and atraumatic. Eyes:  Conjuctiva clear without scleral icterus. Mouth:  Oral mucosa pink and moist.  Heart:  S1, S2 present without murmurs, rubs, or gallops. Regular rate and rhythm. Abdomen:  +BS, soft, non-tender and non-distended. No rebound or guarding. No HSM or masses noted. Msk:  Symmetrical  without gross deformities. Normal posture. Extremities:  Without edema. Neurologic:  Alert and  oriented x4;  grossly normal neurologically. Psych:  Alert and cooperative. Normal mood and affect.

## 2014-08-12 NOTE — Assessment & Plan Note (Addendum)
59 year old female with chronic GERD, doing well on Protonix daily. Occasionally takes twice a day but rare. Weight loss noted and discussed at length with patient. No alarm features present. Upon further discussion, appears she has been decreasing portion sizes and food intake due to financial constraints. Discussed in detail different programs available for food assistance. Discussed need for proper nutrition. Patient states understanding. Will attempt to collect further resources to help assist with nutrition (community services, canned food availability, etc).   Return in 1 year or sooner if needed. Next colonoscopy 2021.

## 2014-08-19 ENCOUNTER — Encounter: Payer: Self-pay | Admitting: Cardiology

## 2014-08-19 ENCOUNTER — Ambulatory Visit (INDEPENDENT_AMBULATORY_CARE_PROVIDER_SITE_OTHER): Payer: Commercial Managed Care - HMO | Admitting: Cardiology

## 2014-08-19 VITALS — BP 128/78 | HR 84 | Ht 68.0 in | Wt 144.0 lb

## 2014-08-19 DIAGNOSIS — I2510 Atherosclerotic heart disease of native coronary artery without angina pectoris: Secondary | ICD-10-CM

## 2014-08-19 DIAGNOSIS — E785 Hyperlipidemia, unspecified: Secondary | ICD-10-CM

## 2014-08-19 DIAGNOSIS — I10 Essential (primary) hypertension: Secondary | ICD-10-CM | POA: Diagnosis not present

## 2014-08-19 NOTE — Patient Instructions (Signed)
Your physician recommends that you schedule a follow-up appointment in: 3 months with Dr. Harl Bowie  Your physician recommends that you continue on your current medications as directed. Please refer to the Current Medication list given to you today.   Thanks for choosing [facility]!!! '

## 2014-08-19 NOTE — Progress Notes (Signed)
CC'ED TO PCP 

## 2014-08-19 NOTE — Progress Notes (Signed)
Patient ID: Kelly Douglas, female   DOB: 30-May-1955, 59 y.o.   MRN: [id number]     Clinical Summary [name] is a 59 y.o.female seen today for follow up of the following medical problems.   1. CAD  - non-obstructive CAD by notes, it appears this was noted by CT scan 03/2011. Does not appear she has had a cath or stress test. 04/2011 LVEF 55-60%.  - denies any chest pain.   2. COPD  - compliant with inhalers   3. Scleroderma  -echo Jan 2015 could not measure PASP, normal RV size and function   4. Hyperlipidemia  - last lipid panel 04/2014 TC 108 TG 70 HDL 37 LDL 57 - compliant with statin  5. Syncope - started as a young child. Can occur every 2-3 months.  - typically occurs with standing. Can feel very hot, vision starts getting dark. Can feel lightheaded/dizzy with standing. Can also occur with looking upward. Gets better with sitting down. No palpitations. No chest pain. Though when she comes too can feel heart racing. Stress can trigger at times - coffee x 3 cups, occasional tea, Dr pepper x 2-3, no energy drinks, no EtoH. 2 bottles of water.  - no tongue biting, no bowel or bladder incontinenc  - last episode was 1 week ago. Episode of presyncope that resolved with sitting.    6. HTN - compliant with meds Past Medical History  Diagnosis Date  . GERD (gastroesophageal reflux disease)     Dr Gala Romney EGD 09/2009->esophagitis, sm HH, antral erosions, atonic esophagus  . Seasonal allergies   . Arthritis   . Scleroderma   . Chronic lung disease     Chronic scarring and volume loss-left lung; characteristics of a chronic infectious process-possible MAI  . Chronic back pain   . Hypothyroid 1981 approx  . Arteriosclerotic cardiovascular disease (ASCVD) 2013    coronary calcification-left main, LAD and CX; small pericardial effusion  . Tobacco abuse 06/24/2009  . Syncope     Multiple spells over the past 40+ years, likely neurocardiogenic  . Hyperlipidemia   .  Depression   . Anxiety   . Chronic bronchitis      No Known Allergies   Current Outpatient Prescriptions  Medication Sig Dispense Refill  . acetaminophen (TYLENOL) 500 MG tablet Take 1,000 mg by mouth every 6 (six) hours as needed for pain.    Marland Kitchen albuterol (PROVENTIL HFA;VENTOLIN HFA) 108 (90 BASE) MCG/ACT inhaler Inhale 2 puffs into the lungs every 6 (six) hours as needed. Shortness of breath 6.7 g 3  . amLODipine (NORVASC) 5 MG tablet TAKE 1 TABLET EVERY DAY 90 tablet 1  . aspirin (ASPIRIN EC) 81 MG EC tablet Take 81 mg by mouth daily. Swallow whole.    . busPIRone (BUSPAR) 5 MG tablet TAKE 1 TABLET THREE TIMES DAILY 270 tablet 1  . dicyclomine (BENTYL) 10 MG capsule Take 1 capsule (10 mg total) by mouth 3 (three) times daily with meals as needed for spasms. 120 capsule 3  . DULoxetine (CYMBALTA) 30 MG capsule Take 60 mg by mouth daily.    . ferrous sulfate 325 (65 FE) MG EC tablet Take 1 tablet (325 mg total) by mouth 2 (two) times daily. 60 tablet 3  . gabapentin (NEURONTIN) 600 MG tablet Take 600 mg by mouth 3 (three) times daily.    Marland Kitchen levothyroxine (SYNTHROID, LEVOTHROID) 75 MCG tablet TAKE 1 TABLET EVERY DAY 90 tablet 1  . Multiple Vitamins-Calcium (ONE-A-DAY WOMENS PO) Take  1 tablet by mouth daily.    . ondansetron (ZOFRAN ODT) 4 MG disintegrating tablet Take 1 tablet (4 mg total) by mouth every 8 (eight) hours as needed for nausea or vomiting. 20 tablet 0  . pantoprazole (PROTONIX) 40 MG tablet TAKE 1 TABLET EVERY DAY 90 tablet 1  . pantoprazole (PROTONIX) 40 MG tablet Take 1 tablet (40 mg total) by mouth daily. 90 tablet 11  . pravastatin (PRAVACHOL) 40 MG tablet TAKE 1 TABLET EVERY DAY 90 tablet 1  . traMADol (ULTRAM) 50 MG tablet Take 50 mg by mouth every 6 (six) hours as needed. 1-2    . traZODone (DESYREL) 100 MG tablet TAKE 1 TABLET EVERY NIGHT AT BEDTIME 90 tablet 1   No current facility-administered medications for this visit.     Past Surgical History  Procedure  Laterality Date  . Tubal ligation    . Bilateral salpingoophorectomy  2013     Dr. Elonda Husky; uterus remains in situ  . Laparoscopic appendectomy  07/16/2011    Procedure: APPENDECTOMY LAPAROSCOPIC;  Surgeon: Donato Heinz, MD;  Location: [facility];  Service: General;  Laterality: N/A;  . Colonoscopy  09/28/09    anal papilla otherwise normal  . Esophagogastroduodenoscopy  10/07/09    Dr. Barrie Dunker of esophageal mucosa, diffusely ?esophagitis (bx benign), small HH/antral erosions, erythema bx benign. 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 Esophagogastroduodenoscopy (egd) with esophageal dilation N/A 03/07/2012    [id number], patent, tubular esophagus of uncertain significance-status post biopsy )unremarkable). Hiatal hernia  . Colonoscopy N/A 08/29/2012    [id number] polyp-removed as described above. tubular adenoma     No Known Allergies    Family History  Problem Relation Age of Onset  . Stroke Father   . Aneurysm Father   . Coronary artery disease Brother   . Hypertension Brother   . Down syndrome Brother   . Hypertension Brother   . Hypertension Brother     father/mother  . Anesthesia problems Neg Hx   . Hypotension Neg Hx   . Malignant hyperthermia Neg Hx   . Pseudochol deficiency Neg Hx   . Colon cancer Neg Hx   . Aneurysm Mother   . Throat cancer Brother 49    throat cancer  . Hypertension Sister   . Diabetes Brother   . Diabetes Brother      Social History [name] reports that she has been smoking Cigarettes.  She started smoking about 43 years ago. She has a 20 pack-year smoking history. She has never used smokeless tobacco. [name] reports that she does not drink alcohol.   Review of Systems CONSTITUTIONAL: No weight loss, fever, chills, weakness or fatigue.  HEENT: Eyes: No visual loss, blurred vision, double vision or yellow sclerae.No hearing loss, sneezing, congestion, runny nose or sore throat.  SKIN: No rash or itching.    CARDIOVASCULAR: per HPI RESPIRATORY: No shortness of breath, cough or sputum.  GASTROINTESTINAL: No anorexia, nausea, vomiting or diarrhea. No abdominal pain or blood.  GENITOURINARY: No burning on urination, no polyuria NEUROLOGICAL: per HPI  MUSCULOSKELETAL: No muscle, back pain, joint pain or stiffness.  LYMPHATICS: No enlarged nodes. No history of splenectomy.  PSYCHIATRIC: No history of depression or anxiety.  ENDOCRINOLOGIC: No reports of sweating, cold or heat intolerance. No polyuria or polydipsia.  Marland Kitchen   Physical Examination Filed Vitals:   08/19/14 0957  BP: 128/78  Pulse: 84   Filed Vitals:   08/19/14 0957  Height: '5\' 8"'$  (1.727 m)  Weight: 144 lb (  65.318 kg)    Gen: resting comfortably, no acute distress HEENT: no scleral icterus, pupils equal round and reactive, no palptable cervical adenopathy,  CV: RRR, no m/r/g, no JVD Resp: Clear to auscultation bilaterally GI: abdomen is soft, non-tender, non-distended, normal bowel sounds, no hepatosplenomegaly MSK: extremities are warm, no edema.  Skin: warm, no rash Neuro:  no focal deficits Psych: appropriate affect   Diagnostic Studies  02/2014 30 day event monitor No arrhythmias. Symptoms of dizziness correlate with NSR  Jan 2015 echo Study Conclusions  - Left ventricle: The cavity size was normal. Wall thickness was increased in a pattern of mild LVH. Systolic function was normal. The estimated ejection fraction was in the range of 60% to 65%. Wall motion was normal; there were no regional wall motion abnormalities. Features are consistent with a pseudonormal left ventricular filling pattern, with concomitant abnormal relaxation and increased filling pressure (grade 2 diastolic dysfunction). - Mitral valve: Trivial regurgitation. - Left atrium: The atrium was at the upper limits of normal in size. - Right ventricle: The cavity size was normal. Wall thickness was mildly increased.  Systolic function was normal. - Right atrium: Central venous pressure: 31m Hg (est). - Atrial septum: No defect or patent foramen ovale was identified. - Tricuspid valve: Trivial regurgitation. - Pulmonary arteries: Systolic pressure could not be accurately estimated. - Pericardium, extracardiac: A trivial pericardial effusion was identified. Impressions:  - Mild LVH with LVEF 674-94% grade 2 diastolic dysfunction. Upper normal left atrial size. Mildly increased RV thickness with normal size and contraction. Trivial tricuspid regurgitation. Doppler envelope is very indistinct - unable to measure PASP. CVP looks to be normal to low. Trivial pericardial effusion.   Assessment and Plan  1. CAD - no current symptoms, continue risk factor modification  2.. Scleroderma - no evidence of significant pulm HTN by prior echo, continue to monitor  3. Hyperlipidemia - at goal, continue current meds  4. HTN - at goal, continue current meds  5. Syncope - long history of syncope, unclear etiology - negative orthostatics in clinic previously. Cardiac monitor without significant arrhythmias. No causative findings on echo, do not see cardiac etiology at this time - continue to wean caffeine, increase water and electrolyte rich fluid intake.    F/u 3 months      JArnoldo Lenis M.D., F.A.C.C.

## 2014-09-01 DIAGNOSIS — E038 Other specified hypothyroidism: Secondary | ICD-10-CM | POA: Diagnosis not present

## 2014-09-02 LAB — TSH: TSH: 1.164 u[IU]/mL (ref 0.350–4.500)

## 2014-09-03 ENCOUNTER — Ambulatory Visit (INDEPENDENT_AMBULATORY_CARE_PROVIDER_SITE_OTHER): Payer: Commercial Managed Care - HMO | Admitting: Family Medicine

## 2014-09-03 ENCOUNTER — Encounter: Payer: Self-pay | Admitting: Family Medicine

## 2014-09-03 VITALS — BP 110/68 | HR 88 | Resp 16 | Ht 68.0 in | Wt 144.0 lb

## 2014-09-03 DIAGNOSIS — I10 Essential (primary) hypertension: Secondary | ICD-10-CM | POA: Diagnosis not present

## 2014-09-03 DIAGNOSIS — F17218 Nicotine dependence, cigarettes, with other nicotine-induced disorders: Secondary | ICD-10-CM

## 2014-09-03 DIAGNOSIS — F329 Major depressive disorder, single episode, unspecified: Secondary | ICD-10-CM

## 2014-09-03 DIAGNOSIS — Z720 Tobacco use: Secondary | ICD-10-CM

## 2014-09-03 DIAGNOSIS — F418 Other specified anxiety disorders: Secondary | ICD-10-CM | POA: Diagnosis not present

## 2014-09-03 DIAGNOSIS — E785 Hyperlipidemia, unspecified: Secondary | ICD-10-CM | POA: Diagnosis not present

## 2014-09-03 DIAGNOSIS — E038 Other specified hypothyroidism: Secondary | ICD-10-CM

## 2014-09-03 DIAGNOSIS — F419 Anxiety disorder, unspecified: Principal | ICD-10-CM

## 2014-09-03 DIAGNOSIS — M549 Dorsalgia, unspecified: Secondary | ICD-10-CM

## 2014-09-03 DIAGNOSIS — F17210 Nicotine dependence, cigarettes, uncomplicated: Secondary | ICD-10-CM

## 2014-09-03 DIAGNOSIS — R7302 Impaired glucose tolerance (oral): Secondary | ICD-10-CM

## 2014-09-03 MED ORDER — BUSPIRONE HCL 7.5 MG PO TABS: 7.5000 mg | ORAL_TABLET | Freq: Three times a day (TID) | ORAL | Status: DC

## 2014-09-03 NOTE — Patient Instructions (Addendum)
F/u in January, call if you need m e before  Increase in dose of buspar to 7.5 mg three times daily  Please work on smoking cessation , you NEED to [place] , to reduce cancer and heart disease and stroke risk, current is 10 per day, by Christmas max of  5 per day  Rectal today   You are referred for annual low dose chest scan for lung cancer screen , since started smoking at 69 /yo, an dhave smoked  One opr more PPD for ovr 35 years  Flu vaccine in September  CBC, fasting lipid, cmp and eGFR, HBA1C, TSH in January  Smoking Cessation, Tips for Success If you are ready to quit smoking, congratulations! You have chosen to help yourself be healthier. Cigarettes bring nicotine, tar, carbon monoxide, and other irritants into your body. Your lungs, heart, and blood vessels will be able to work better without these poisons. There are many different ways to quit smoking. Nicotine gum, nicotine patches, a nicotine inhaler, or nicotine nasal spray can help with physical craving. Hypnosis, support groups, and medicines help break the habit of smoking. WHAT THINGS CAN I DO TO MAKE QUITTING EASIER?  Here are some tips to help you quit for good:  Pick a date when you will quit smoking completely. Tell all of your friends and family about your plan to quit on that date.  Do not try to slowly cut down on the number of cigarettes you are smoking. Pick a quit date and quit smoking completely starting on that day.  Throw away all cigarettes.   Clean and remove all ashtrays from your home, work, and car.  On a card, write down your reasons for quitting. Carry the card with you and read it when you get the urge to smoke.  Cleanse your body of nicotine. Drink enough water and fluids to keep your urine clear or pale yellow. Do this after quitting to flush the nicotine from your body.  Learn to predict your moods. Do not let a bad situation be your excuse to have a cigarette. Some situations in your life might  tempt you into wanting a cigarette.  Never have "just one" cigarette. It leads to wanting another and another. Remind yourself of your decision to quit.  Change habits associated with smoking. If you smoked while driving or when feeling stressed, try other activities to replace smoking. Stand up when drinking your coffee. Brush your teeth after eating. Sit in a different chair when you read the paper. Avoid alcohol while trying to quit, and try to drink fewer caffeinated beverages. Alcohol and caffeine may urge you to smoke.  Avoid foods and drinks that can trigger a desire to smoke, such as sugary or spicy foods and alcohol.  Ask people who smoke not to smoke around you.  Have something planned to do right after eating or having a cup of coffee. For example, plan to take a walk or exercise.  Try a relaxation exercise to calm you down and decrease your stress. Remember, you may be tense and nervous for the first 2 weeks after you quit, but this will pass.  Find new activities to keep your hands busy. Play with a pen, coin, or rubber band. Doodle or draw things on paper.  Brush your teeth right after eating. This will help cut down on the craving for the taste of tobacco after meals. You can also try mouthwash.   Use oral substitutes in place of cigarettes. Try  using lemon drops, carrots, cinnamon sticks, or chewing gum. Keep them handy so they are available when you have the urge to smoke.  When you have the urge to smoke, try deep breathing.  Designate your home as a nonsmoking area.  If you are a heavy smoker, ask your health care provider about a prescription for nicotine chewing gum. It can ease your withdrawal from nicotine.  Reward yourself. Set aside the cigarette money you save and buy yourself something nice.  Look for support from others. Join a support group or smoking cessation program. Ask someone at home or at work to help you with your plan to quit smoking.  Always ask  yourself, "Do I need this cigarette or is this just a reflex?" Tell yourself, "Today, I choose not to smoke," or "I do not want to smoke." You are reminding yourself of your decision to quit.  Do not replace cigarette smoking with electronic cigarettes (commonly called e-cigarettes). The safety of e-cigarettes is unknown, and some may contain harmful chemicals.  If you relapse, do not give up! Plan ahead and think about what you will do the next time you get the urge to smoke. HOW WILL I FEEL WHEN I QUIT SMOKING? You may have symptoms of withdrawal because your body is used to nicotine (the addictive substance in cigarettes). You may crave cigarettes, be irritable, feel very hungry, cough often, get headaches, or have difficulty concentrating. The withdrawal symptoms are only temporary. They are strongest when you first quit but will go away within 10-14 days. When withdrawal symptoms occur, stay in control. Think about your reasons for quitting. Remind yourself that these are signs that your body is healing and getting used to being without cigarettes. Remember that withdrawal symptoms are easier to treat than the major diseases that smoking can cause.  Even after the withdrawal is over, expect periodic urges to smoke. However, these cravings are generally short lived and will go away whether you smoke or not. Do not smoke! WHAT RESOURCES ARE AVAILABLE TO HELP ME QUIT SMOKING? Your health care provider can direct you to community resources or hospitals for support, which may include:  Group support.  Education.  Hypnosis.  Therapy. Document Released: 10/02/2003 Document Revised: 05/20/2013 Document Reviewed: 06/21/2012 [facility] Patient Information 2015 [place], [place]. This information is not intended to replace advice given to you by your health care provider. Make sure you discuss any questions you have with your health care provider.

## 2014-09-11 ENCOUNTER — Encounter: Payer: Self-pay | Admitting: Orthopedic Surgery

## 2014-09-11 ENCOUNTER — Other Ambulatory Visit: Payer: Self-pay

## 2014-09-11 ENCOUNTER — Encounter: Payer: Self-pay | Admitting: Family Medicine

## 2014-09-11 DIAGNOSIS — F329 Major depressive disorder, single episode, unspecified: Secondary | ICD-10-CM

## 2014-09-11 DIAGNOSIS — F419 Anxiety disorder, unspecified: Principal | ICD-10-CM

## 2014-09-11 MED ORDER — BUSPIRONE HCL 7.5 MG PO TABS: 7.5000 mg | ORAL_TABLET | Freq: Three times a day (TID) | ORAL | Status: DC

## 2014-09-17 ENCOUNTER — Ambulatory Visit (HOSPITAL_COMMUNITY): Payer: Commercial Managed Care - HMO

## 2014-09-26 ENCOUNTER — Ambulatory Visit (HOSPITAL_COMMUNITY)
Admission: RE | Admit: 2014-09-26 | Discharge: 2014-09-26 | Disposition: A | Discharge status: Home / Self Care | Payer: Commercial Managed Care - HMO | Source: Ambulatory Visit | Attending: Family Medicine | Admitting: Family Medicine

## 2014-09-26 DIAGNOSIS — Z122 Encounter for screening for malignant neoplasm of respiratory organs: Secondary | ICD-10-CM | POA: Diagnosis not present

## 2014-09-26 DIAGNOSIS — Z87891 Personal history of nicotine dependence: Secondary | ICD-10-CM | POA: Diagnosis not present

## 2014-09-26 DIAGNOSIS — R918 Other nonspecific abnormal finding of lung field: Secondary | ICD-10-CM | POA: Diagnosis not present

## 2014-09-26 DIAGNOSIS — J439 Emphysema, unspecified: Secondary | ICD-10-CM | POA: Insufficient documentation

## 2014-09-26 DIAGNOSIS — F17210 Nicotine dependence, cigarettes, uncomplicated: Secondary | ICD-10-CM

## 2014-09-26 DIAGNOSIS — Z720 Tobacco use: Secondary | ICD-10-CM

## 2014-09-30 ENCOUNTER — Other Ambulatory Visit: Payer: Self-pay

## 2014-09-30 DIAGNOSIS — R55 Syncope and collapse: Secondary | ICD-10-CM | POA: Diagnosis not present

## 2014-09-30 DIAGNOSIS — J984 Other disorders of lung: Secondary | ICD-10-CM

## 2014-09-30 DIAGNOSIS — M533 Sacrococcygeal disorders, not elsewhere classified: Secondary | ICD-10-CM | POA: Diagnosis not present

## 2014-09-30 DIAGNOSIS — M519 Unspecified thoracic, thoracolumbar and lumbosacral intervertebral disc disorder: Secondary | ICD-10-CM | POA: Diagnosis not present

## 2014-09-30 DIAGNOSIS — G5600 Carpal tunnel syndrome, unspecified upper limb: Secondary | ICD-10-CM | POA: Diagnosis not present

## 2014-10-01 ENCOUNTER — Telehealth: Payer: Self-pay | Admitting: *Deleted

## 2014-10-01 NOTE — Telephone Encounter (Signed)
Patient has a appt with Dr. Luan Pulling 10/08/14 at 11:00. I called patient and made her aware.

## 2014-10-01 NOTE — Telephone Encounter (Signed)
Noted  

## 2014-10-18 NOTE — Assessment & Plan Note (Signed)
Uncontrolled anxiety , increase dose of buspar

## 2014-10-18 NOTE — Assessment & Plan Note (Signed)
Hyperlipidemia:Low fat diet discussed and encouraged.   Lipid Panel  Lab Results  Component Value Date   CHOL 108 04/23/2014   HDL 37* 04/23/2014   LDLCALC 57 04/23/2014   TRIG 70 04/23/2014   CHOLHDL 2.9 04/23/2014  Updated lab needed at/ before next visit.

## 2014-10-18 NOTE — Assessment & Plan Note (Signed)
Controlled, no change in medication DASH diet and commitment to daily physical activity for a minimum of 30 minutes discussed and encouraged, as a part of hypertension management. The importance of attaining a healthy weight is also discussed.  BP/Weight 09/03/2014 08/19/2014 08/11/2014 04/23/2014 02/05/2014 12/25/2013 64/03/8379  Systolic BP 840 375 436 067 703 - 403  Diastolic BP 68 78 68 66 74 - 64  Wt. (Lbs) 144 144 142.8 151 151 150 150.08  BMI 21.9 21.9 21.72 23.64 23.64 22.14 22.15

## 2014-10-18 NOTE — Assessment & Plan Note (Signed)
Controlled, no change in medication  

## 2014-10-18 NOTE — Assessment & Plan Note (Signed)
Unchanged and controlled on current medication

## 2014-10-18 NOTE — Progress Notes (Signed)
   VANECIA LIMPERT     MRN: [id number]      DOB: 06/12/1955   HPI [name] is here for follow up and re-evaluation of chronic medical conditions, medication management and review of any available recent lab and radiology data.  Preventive health is updated, specifically  Cancer screening and Immunization.   Questions or concerns regarding consultations or procedures which the PT has had in the interim are  Addressed.Sees neurology for chronic pain management which she states is improved The PT denies any adverse reactions to current medications since the last visit.  C/o excessive fatigue ROS Denies recent fever or chills. Denies sinus pressure, nasal congestion, ear pain or sore throat. Denies chest congestion, productive cough or wheezing. Denies chest pains, palpitations and leg swelling Denies abdominal pain, nausea, vomiting,diarrhea or constipation.   Denies dysuria, frequency, hesitancy or incontinence. Denies uncontrolled  joint pain, swelling and limitation in mobility. Denies headaches, seizures, numbness, or tingling. Denies depression,  C/o increased anxiety, denies  insomnia. Denies skin break down or rash.   PE  BP 110/68 mmHg  Pulse 88  Resp 16  Ht '5\' 8"'$  (1.727 m)  Wt 144 lb (65.318 kg)  BMI 21.90 kg/m2  SpO2 94%  Patient alert and oriented and in no cardiopulmonary distress.  HEENT: No facial asymmetry, EOMI,   oropharynx pink and moist.  Neck supple no JVD, no mass.  Chest: Clear to auscultation bilaterally.Decreased air entry bilaterally  CVS: S1, S2 no murmurs, no S3.Regular rate.  ABD: Soft non tender.   Ext: No edema  MS: Adequate though reduced ROM spine, shoulders, hips and knees.  Skin: Intact, no ulcerations or rash noted.  Psych: Good eye contact, normal affect. Memory intact  anxious not  depressed appearing.  CNS: CN 2-12 intact, power,  normal throughout.no focal deficits noted.   Assessment & Plan   Anxiety and  depression Uncontrolled anxiety , increase dose of buspar  Nicotine dependence Patient counseled for approximately 5 minutes regarding the health risks of ongoing nicotine use, specifically all types of cancer, heart disease, stroke and respiratory failure. The options available for help with cessation ,the behavioral changes to assist the process, and the option to either gradully reduce usage  Or abruptly stop.is also discussed. Pt is also encouraged to set specific goals in number of cigarettes used daily, as well as to set a quit date.  Number of cigarettes/cigars currently smoking daily: 10    Back pain with radiation Unchanged and controlled on current medication  Essential hypertension Controlled, no change in medication DASH diet and commitment to daily physical activity for a minimum of 30 minutes discussed and encouraged, as a part of hypertension management. The importance of attaining a healthy weight is also discussed.  BP/Weight 09/03/2014 08/19/2014 08/11/2014 04/23/2014 02/05/2014 12/25/2013 09/19/2669  Systolic BP 245 809 983 382 505 - 397  Diastolic BP 68 78 68 66 74 - 64  Wt. (Lbs) 144 144 142.8 151 151 150 150.08  BMI 21.9 21.9 21.72 23.64 23.64 22.14 22.15        Hyperlipidemia Hyperlipidemia:Low fat diet discussed and encouraged.   Lipid Panel  Lab Results  Component Value Date   CHOL 108 04/23/2014   HDL 37* 04/23/2014   LDLCALC 57 04/23/2014   TRIG 70 04/23/2014   CHOLHDL 2.9 04/23/2014  Updated lab needed at/ before next visit.       Hypothyroid Controlled, no change in medication

## 2014-10-18 NOTE — Assessment & Plan Note (Signed)

## 2014-10-27 DIAGNOSIS — J449 Chronic obstructive pulmonary disease, unspecified: Secondary | ICD-10-CM | POA: Diagnosis not present

## 2014-10-27 DIAGNOSIS — I10 Essential (primary) hypertension: Secondary | ICD-10-CM | POA: Diagnosis not present

## 2014-10-27 DIAGNOSIS — F17200 Nicotine dependence, unspecified, uncomplicated: Secondary | ICD-10-CM | POA: Diagnosis not present

## 2014-10-27 DIAGNOSIS — J984 Other disorders of lung: Secondary | ICD-10-CM | POA: Diagnosis not present

## 2014-11-05 ENCOUNTER — Telehealth: Payer: Self-pay | Admitting: *Deleted

## 2014-11-05 DIAGNOSIS — R040 Epistaxis: Secondary | ICD-10-CM

## 2014-11-05 NOTE — Telephone Encounter (Signed)
Patient called requesting a referral to the ENT patient stated her nose will bleed twice a day and sometime she can't stop it, patient states it has been going on about 2 or 3 months. Please advise

## 2014-11-06 NOTE — Telephone Encounter (Signed)
Wants to be referred to teoh for nose bleeds again. States she has been to him before for the same problem. Has them about twice a week. Ok to refer?

## 2014-11-06 NOTE — Telephone Encounter (Signed)
Yes pls do I will sign 

## 2014-11-10 NOTE — Addendum Note (Signed)
Addended by: Eual Fines on: 11/10/2014 09:02 AM   Modules accepted: Orders

## 2014-11-10 NOTE — Telephone Encounter (Signed)
Referred and pt aware

## 2014-11-20 ENCOUNTER — Ambulatory Visit (INDEPENDENT_AMBULATORY_CARE_PROVIDER_SITE_OTHER): Payer: Commercial Managed Care - HMO | Admitting: Otolaryngology

## 2014-11-20 ENCOUNTER — Ambulatory Visit (INDEPENDENT_AMBULATORY_CARE_PROVIDER_SITE_OTHER): Payer: Commercial Managed Care - HMO

## 2014-11-20 DIAGNOSIS — Z23 Encounter for immunization: Secondary | ICD-10-CM | POA: Diagnosis not present

## 2014-11-20 DIAGNOSIS — R040 Epistaxis: Secondary | ICD-10-CM

## 2014-12-18 ENCOUNTER — Ambulatory Visit (INDEPENDENT_AMBULATORY_CARE_PROVIDER_SITE_OTHER): Payer: Commercial Managed Care - HMO | Admitting: Otolaryngology

## 2014-12-18 DIAGNOSIS — R040 Epistaxis: Secondary | ICD-10-CM | POA: Diagnosis not present

## 2014-12-18 DIAGNOSIS — H6121 Impacted cerumen, right ear: Secondary | ICD-10-CM

## 2014-12-29 DIAGNOSIS — M533 Sacrococcygeal disorders, not elsewhere classified: Secondary | ICD-10-CM | POA: Diagnosis not present

## 2014-12-29 DIAGNOSIS — I10 Essential (primary) hypertension: Secondary | ICD-10-CM | POA: Diagnosis not present

## 2014-12-29 DIAGNOSIS — R55 Syncope and collapse: Secondary | ICD-10-CM | POA: Diagnosis not present

## 2014-12-29 DIAGNOSIS — E785 Hyperlipidemia, unspecified: Secondary | ICD-10-CM | POA: Diagnosis not present

## 2014-12-29 DIAGNOSIS — G479 Sleep disorder, unspecified: Secondary | ICD-10-CM | POA: Diagnosis not present

## 2014-12-29 DIAGNOSIS — M519 Unspecified thoracic, thoracolumbar and lumbosacral intervertebral disc disorder: Secondary | ICD-10-CM | POA: Diagnosis not present

## 2014-12-29 DIAGNOSIS — G5600 Carpal tunnel syndrome, unspecified upper limb: Secondary | ICD-10-CM | POA: Diagnosis not present

## 2014-12-29 DIAGNOSIS — M2550 Pain in unspecified joint: Secondary | ICD-10-CM | POA: Diagnosis not present

## 2015-02-03 ENCOUNTER — Ambulatory Visit (INDEPENDENT_AMBULATORY_CARE_PROVIDER_SITE_OTHER): Payer: Commercial Managed Care - HMO | Admitting: Family Medicine

## 2015-02-03 ENCOUNTER — Encounter: Payer: Self-pay | Admitting: Family Medicine

## 2015-02-03 VITALS — BP 126/80 | HR 100 | Resp 16 | Ht 68.0 in | Wt 153.1 lb

## 2015-02-03 DIAGNOSIS — F32A Depression, unspecified: Secondary | ICD-10-CM

## 2015-02-03 DIAGNOSIS — R7302 Impaired glucose tolerance (oral): Secondary | ICD-10-CM

## 2015-02-03 DIAGNOSIS — G4700 Insomnia, unspecified: Secondary | ICD-10-CM

## 2015-02-03 DIAGNOSIS — M25552 Pain in left hip: Secondary | ICD-10-CM | POA: Diagnosis not present

## 2015-02-03 DIAGNOSIS — M25551 Pain in right hip: Secondary | ICD-10-CM

## 2015-02-03 DIAGNOSIS — M549 Dorsalgia, unspecified: Secondary | ICD-10-CM

## 2015-02-03 DIAGNOSIS — F17218 Nicotine dependence, cigarettes, with other nicotine-induced disorders: Secondary | ICD-10-CM | POA: Diagnosis not present

## 2015-02-03 DIAGNOSIS — F329 Major depressive disorder, single episode, unspecified: Secondary | ICD-10-CM

## 2015-02-03 DIAGNOSIS — F418 Other specified anxiety disorders: Secondary | ICD-10-CM

## 2015-02-03 DIAGNOSIS — E785 Hyperlipidemia, unspecified: Secondary | ICD-10-CM | POA: Diagnosis not present

## 2015-02-03 DIAGNOSIS — I10 Essential (primary) hypertension: Secondary | ICD-10-CM

## 2015-02-03 DIAGNOSIS — F419 Anxiety disorder, unspecified: Secondary | ICD-10-CM

## 2015-02-03 DIAGNOSIS — E038 Other specified hypothyroidism: Secondary | ICD-10-CM

## 2015-02-03 MED ORDER — PREDNISONE 5 MG (21) PO TBPK: ORAL_TABLET | ORAL | Status: DC

## 2015-02-03 MED ORDER — LEVOTHYROXINE SODIUM 75 MCG PO TABS: 75.0000 ug | ORAL_TABLET | Freq: Every day | ORAL | Status: DC

## 2015-02-03 MED ORDER — KETOROLAC TROMETHAMINE 60 MG/2ML IM SOLN
60.0000 mg | Freq: Once | INTRAMUSCULAR | Status: AC
  Administered 2015-02-03: 60 mg via INTRAMUSCULAR

## 2015-02-03 MED ORDER — BUSPIRONE HCL 7.5 MG PO TABS: 7.5000 mg | ORAL_TABLET | Freq: Three times a day (TID) | ORAL | Status: DC

## 2015-02-03 MED ORDER — METHYLPREDNISOLONE ACETATE 80 MG/ML IJ SUSP
80.0000 mg | Freq: Once | INTRAMUSCULAR | Status: AC
  Administered 2015-02-03: 80 mg via INTRAMUSCULAR

## 2015-02-03 MED ORDER — GABAPENTIN 600 MG PO TABS: 600.0000 mg | ORAL_TABLET | Freq: Three times a day (TID) | ORAL | Status: DC

## 2015-02-03 NOTE — Progress Notes (Signed)
Subjective:    Patient ID: Kelly Douglas, female    DOB: Feb 17, 1955, 60 y.o.   MRN: [id number]  HPI    MARYCARMEN HAGEY     MRN: [id number]      DOB: 1956/01/13   HPI Ms. Batley is here for follow up and re-evaluation of chronic medical conditions, medication management and review of any available recent lab and radiology data.  Preventive health is updated, specifically  Cancer screening and Immunization.   Questions or concerns regarding consultations or procedures which the PT has had in the interim are  addressed. The PT denies any adverse reactions to current medications since the last visit.  Increased and uncontrolled bilateral hip [pain, which is new, no trauma, also back pain Increased anxiety and inomnia due to recent deaths in her family ROS Denies recent fever or chills. Denies sinus pressure, nasal congestion, ear pain or sore throat. Denies chest congestion, productive cough or wheezing. Denies chest pains, palpitations and leg swelling Denies abdominal pain, nausea, vomiting,diarrhea or constipation.   Denies dysuria, frequency, hesitancy or incontinence. . Denies headaches, seizures, numbness, or tingling.  Denies skin break down or rash.   PE  BP 126/80 mmHg  Pulse 100  Resp 16  Ht '5\' 8"'$  (1.727 m)  Wt 153 lb 1.9 oz (69.455 kg)  BMI 23.29 kg/m2  SpO2 96%  Patient alert and oriented and in no cardiopulmonary distress.  HEENT: No facial asymmetry, EOMI,   oropharynx pink and moist.  Neck supple no JVD, no mass.  Chest: Clear to auscultation bilaterally.decreased air entry  CVS: S1, S2 no murmurs, no S3.Regular rate.  ABD: Soft non tender.   Ext: No edema  MS: Adequate  Though reduced ROM spine,and , hips and normal in shoulders and knees.  Skin: Intact, no ulcerations or rash noted.  Psych: Good eye contact, normal affect. Memory intact mildly  anxious and  depressed appearing.  CNS: CN 2-12 intact, power,  normal throughout.no focal  deficits noted.   Assessment & Plan   Essential hypertension Controlled, no change in medication DASH diet and commitment to daily physical activity for a minimum of 30 minutes discussed and encouraged, as a part of hypertension management. The importance of attaining a healthy weight is also discussed.  BP/Weight 02/03/2015 09/03/2014 08/19/2014 08/11/2014 04/23/2014 02/05/2014 71/0/6269  Systolic BP 485 462 703 500 938 182 -  Diastolic BP 80 68 78 68 66 74 -  Wt. (Lbs) 153.12 144 144 142.8 151 151 150  BMI 23.29 21.9 21.9 21.72 23.64 23.64 22.14        Hypothyroid Updated lab needed .  Hyperlipidemia Hyperlipidemia:Low fat diet discussed and encouraged.   Lipid Panel  Lab Results  Component Value Date   CHOL 108 04/23/2014   HDL 37* 04/23/2014   LDLCALC 57 04/23/2014   TRIG 70 04/23/2014   CHOLHDL 2.9 04/23/2014   Updated lab needed       Nicotine dependence Patient counseled for approximately 5 minutes regarding the health risks of ongoing nicotine use, specifically all types of cancer, heart disease, stroke and respiratory failure. The options available for help with cessation ,the behavioral changes to assist the process, and the option to either gradully reduce usage  Or abruptly stop.is also discussed. Pt is also encouraged to set specific goals in number of cigarettes used daily, as well as to set a quit date.  Number of cigarettes/cigars currently smoking daily: 30   Insomnia Sleep hygiene reviewed and written information offered  also. Prescription sent for  medication needed.   Bilateral hip pain Uncontrolled.Toradol and depo medrol administered IM in the office , x ray of hips  Needed, likely cause ids back pain.   Back pain with radiation Uncontrolled.Toradol and depo medrol administered IM in the office ,  Anxiety and depression Increased due to recent loss of family member, overall improved and controlled on meds       Review of  Systems     Objective:   Physical Exam        Assessment & Plan:

## 2015-02-03 NOTE — Assessment & Plan Note (Signed)
Hyperlipidemia:Low fat diet discussed and encouraged.   Lipid Panel  Lab Results  Component Value Date   CHOL 108 04/23/2014   HDL 37* 04/23/2014   LDLCALC 57 04/23/2014   TRIG 70 04/23/2014   CHOLHDL 2.9 04/23/2014   Updated lab needed

## 2015-02-03 NOTE — Assessment & Plan Note (Signed)
Sleep hygiene reviewed and written information offered also. Prescription sent for  medication needed.  

## 2015-02-03 NOTE — Assessment & Plan Note (Signed)
Updated lab needed.  

## 2015-02-03 NOTE — Patient Instructions (Addendum)
Annual wellness in in early May , call if you need me before  Injections in office today for arthritic pain and please get x rays of hips so I can see if problem is with hip or if the pain is from back  STOP ibuprofen ,  Condolence re recent loss  Cut back on smoking, now on 30 per day, need to cUT bACK!  Hope things improve

## 2015-02-03 NOTE — Assessment & Plan Note (Signed)

## 2015-02-03 NOTE — Assessment & Plan Note (Signed)
Controlled, no change in medication DASH diet and commitment to daily physical activity for a minimum of 30 minutes discussed and encouraged, as a part of hypertension management. The importance of attaining a healthy weight is also discussed.  BP/Weight 02/03/2015 09/03/2014 08/19/2014 08/11/2014 04/23/2014 02/05/2014 78/09/3808  Systolic BP 175 102 585 277 824 235 -  Diastolic BP 80 68 78 68 66 74 -  Wt. (Lbs) 153.12 144 144 142.8 151 151 150  BMI 23.29 21.9 21.9 21.72 23.64 23.64 22.14

## 2015-02-04 LAB — CBC
HEMATOCRIT: 37.5 % (ref 36.0–46.0)
HEMOGLOBIN: 12.2 g/dL (ref 12.0–15.0)
MCH: 26 pg (ref 26.0–34.0)
MCHC: 32.5 g/dL (ref 30.0–36.0)
MCV: 79.8 fL (ref 78.0–100.0)
MPV: 9.5 fL (ref 8.6–12.4)
Platelets: 260 10*3/uL (ref 150–400)
RBC: 4.7 MIL/uL (ref 3.87–5.11)
RDW: 15 % (ref 11.5–15.5)
WBC: 9.1 10*3/uL (ref 4.0–10.5)

## 2015-02-04 LAB — COMPLETE METABOLIC PANEL WITH GFR
ALBUMIN: 4.5 g/dL (ref 3.6–5.1)
ALK PHOS: 99 U/L (ref 33–130)
ALT: 11 U/L (ref 6–29)
AST: 14 U/L (ref 10–35)
BILIRUBIN TOTAL: 0.4 mg/dL (ref 0.2–1.2)
BUN: 10 mg/dL (ref 7–25)
CO2: 29 mmol/L (ref 20–31)
CREATININE: 0.81 mg/dL (ref 0.50–1.05)
Calcium: 9.5 mg/dL (ref 8.6–10.4)
Chloride: 104 mmol/L (ref 98–110)
GFR, Est African American: >89 mL/min (ref >=60)
GFR, Est Non African American: 80 mL/min (ref >=60)
GLUCOSE: 74 mg/dL (ref 65–99)
Potassium: 3.8 mmol/L (ref 3.5–5.3)
SODIUM: 142 mmol/L (ref 135–146)
TOTAL PROTEIN: 6.9 g/dL (ref 6.1–8.1)

## 2015-02-04 LAB — TSH: TSH: 1.28 u[IU]/mL (ref 0.350–4.500)

## 2015-02-04 LAB — LIPID PANEL
Cholesterol: 110 mg/dL — ABNORMAL LOW (ref 125–200)
HDL: 37 mg/dL — AB (ref >=46)
LDL CALC: 49 mg/dL (ref <130)
Total CHOL/HDL Ratio: 3 Ratio (ref <=5.0)
Triglycerides: 121 mg/dL (ref <150)
VLDL: 24 mg/dL (ref <30)

## 2015-02-04 LAB — HEMOGLOBIN A1C
HEMOGLOBIN A1C: 5.8 % — AB (ref <5.7)
Mean Plasma Glucose: 120 mg/dL — ABNORMAL HIGH (ref <117)

## 2015-02-05 ENCOUNTER — Ambulatory Visit (INDEPENDENT_AMBULATORY_CARE_PROVIDER_SITE_OTHER): Payer: Commercial Managed Care - HMO | Admitting: Otolaryngology

## 2015-02-05 DIAGNOSIS — R040 Epistaxis: Secondary | ICD-10-CM

## 2015-02-12 DIAGNOSIS — M349 Systemic sclerosis, unspecified: Secondary | ICD-10-CM | POA: Diagnosis not present

## 2015-02-12 DIAGNOSIS — I10 Essential (primary) hypertension: Secondary | ICD-10-CM | POA: Diagnosis not present

## 2015-02-12 DIAGNOSIS — F17200 Nicotine dependence, unspecified, uncomplicated: Secondary | ICD-10-CM | POA: Diagnosis not present

## 2015-02-12 DIAGNOSIS — J449 Chronic obstructive pulmonary disease, unspecified: Secondary | ICD-10-CM | POA: Diagnosis not present

## 2015-02-19 ENCOUNTER — Other Ambulatory Visit: Payer: Self-pay | Admitting: Family Medicine

## 2015-02-19 DIAGNOSIS — Z1231 Encounter for screening mammogram for malignant neoplasm of breast: Secondary | ICD-10-CM

## 2015-02-25 ENCOUNTER — Telehealth: Payer: Self-pay | Admitting: Family Medicine

## 2015-02-25 NOTE — Telephone Encounter (Signed)
Kelly Douglas is calling asking for Lab Results, please advise?

## 2015-02-26 NOTE — Telephone Encounter (Signed)
Patient aware.

## 2015-03-02 NOTE — Assessment & Plan Note (Signed)
Uncontrolled.Toradol and depo medrol administered IM in the office , x ray of hips  Needed, likely cause ids back pain.

## 2015-03-02 NOTE — Assessment & Plan Note (Signed)
Increased due to recent loss of family member, overall improved and controlled on meds

## 2015-03-02 NOTE — Assessment & Plan Note (Addendum)
Uncontrolled.Toradol and depo medrol administered IM in the office , 

## 2015-03-05 ENCOUNTER — Other Ambulatory Visit: Payer: Self-pay | Admitting: Family Medicine

## 2015-03-05 ENCOUNTER — Ambulatory Visit (INDEPENDENT_AMBULATORY_CARE_PROVIDER_SITE_OTHER): Payer: Commercial Managed Care - HMO | Admitting: Otolaryngology

## 2015-03-05 ENCOUNTER — Ambulatory Visit (HOSPITAL_COMMUNITY)
Admission: RE | Admit: 2015-03-05 | Discharge: 2015-03-05 | Disposition: A | Discharge status: Home / Self Care | Payer: Commercial Managed Care - HMO | Source: Ambulatory Visit | Attending: Family Medicine | Admitting: Family Medicine

## 2015-03-05 DIAGNOSIS — M25551 Pain in right hip: Secondary | ICD-10-CM

## 2015-03-05 DIAGNOSIS — M25552 Pain in left hip: Secondary | ICD-10-CM | POA: Diagnosis not present

## 2015-03-05 DIAGNOSIS — G8929 Other chronic pain: Secondary | ICD-10-CM | POA: Insufficient documentation

## 2015-03-05 DIAGNOSIS — R040 Epistaxis: Secondary | ICD-10-CM | POA: Diagnosis not present

## 2015-03-26 ENCOUNTER — Telehealth: Payer: Self-pay | Admitting: Family Medicine

## 2015-03-26 MED ORDER — BENZONATATE 100 MG PO CAPS: 100.0000 mg | ORAL_CAPSULE | Freq: Two times a day (BID) | ORAL | Status: DC | PRN

## 2015-03-26 MED ORDER — PREDNISONE 5 MG (21) PO TBPK: 5.0000 mg | ORAL_TABLET | ORAL | Status: DC

## 2015-03-26 MED ORDER — PROMETHAZINE-DM 6.25-15 MG/5ML PO SYRP: ORAL_SOLUTION | ORAL | Status: DC

## 2015-03-26 MED ORDER — MONTELUKAST SODIUM 10 MG PO TABS: 10.0000 mg | ORAL_TABLET | Freq: Every day | ORAL | Status: DC

## 2015-03-26 NOTE — Telephone Encounter (Signed)
Called and left message for patient to return call.  

## 2015-03-26 NOTE — Telephone Encounter (Signed)
I have sent pred dose pack, tessalon perles and singulair pls let her know , if worse next week with fever etc, call for work in appt

## 2015-03-26 NOTE — Telephone Encounter (Signed)
Patient is complaining of sinus and chest congestion, cant breathe, denies fever, symptoms x's 1 wk, has tried Robitussin and Tylenol Cold and Sinus and she feels like its getting worse, please advise?

## 2015-03-30 ENCOUNTER — Other Ambulatory Visit: Payer: Self-pay

## 2015-03-30 MED ORDER — MONTELUKAST SODIUM 10 MG PO TABS: 10.0000 mg | ORAL_TABLET | Freq: Every day | ORAL | Status: DC

## 2015-03-30 MED ORDER — BENZONATATE 100 MG PO CAPS: 100.0000 mg | ORAL_CAPSULE | Freq: Two times a day (BID) | ORAL | Status: DC | PRN

## 2015-03-30 MED ORDER — PROMETHAZINE-DM 6.25-15 MG/5ML PO SYRP: ORAL_SOLUTION | ORAL | Status: DC

## 2015-03-30 MED ORDER — PREDNISONE 5 MG (21) PO TBPK: 5.0000 mg | ORAL_TABLET | ORAL | Status: DC

## 2015-03-30 NOTE — Telephone Encounter (Signed)
Patient aware.  Meds stopped from mail order pharmacy and sent locally to [facility].

## 2015-04-16 ENCOUNTER — Ambulatory Visit (HOSPITAL_COMMUNITY)
Admission: RE | Admit: 2015-04-16 | Discharge: 2015-04-16 | Disposition: A | Discharge status: Home / Self Care | Payer: Commercial Managed Care - HMO | Source: Ambulatory Visit | Attending: Family Medicine | Admitting: Family Medicine

## 2015-04-16 DIAGNOSIS — Z1231 Encounter for screening mammogram for malignant neoplasm of breast: Secondary | ICD-10-CM | POA: Diagnosis not present

## 2015-04-22 ENCOUNTER — Other Ambulatory Visit: Payer: Self-pay | Admitting: Family Medicine

## 2015-04-24 ENCOUNTER — Other Ambulatory Visit (HOSPITAL_COMMUNITY): Payer: Self-pay | Admitting: Respiratory Therapy

## 2015-04-24 DIAGNOSIS — J441 Chronic obstructive pulmonary disease with (acute) exacerbation: Secondary | ICD-10-CM

## 2015-04-26 ENCOUNTER — Other Ambulatory Visit: Payer: Self-pay | Admitting: Family Medicine

## 2015-04-26 ENCOUNTER — Telehealth: Payer: Self-pay | Admitting: Family Medicine

## 2015-04-26 DIAGNOSIS — F17208 Nicotine dependence, unspecified, with other nicotine-induced disorders: Secondary | ICD-10-CM

## 2015-04-26 DIAGNOSIS — R9389 Abnormal findings on diagnostic imaging of other specified body structures: Secondary | ICD-10-CM

## 2015-04-26 NOTE — Telephone Encounter (Signed)
Pls contact pt, her screening chest scan was due in March as a 6 mont f/u , she still has not had this. I have entered the order  Pls schedule asa, since already past due  and  Give her appt info, thanks

## 2015-04-28 ENCOUNTER — Other Ambulatory Visit (HOSPITAL_COMMUNITY): Payer: Self-pay | Admitting: Respiratory Therapy

## 2015-04-28 NOTE — Telephone Encounter (Signed)
Awaiting Biochemist, clinical

## 2015-05-08 ENCOUNTER — Ambulatory Visit (HOSPITAL_COMMUNITY)
Admission: RE | Admit: 2015-05-08 | Discharge: 2015-05-08 | Disposition: A | Discharge status: Home / Self Care | Payer: Commercial Managed Care - HMO | Source: Ambulatory Visit | Attending: Pulmonary Disease | Admitting: Pulmonary Disease

## 2015-05-08 ENCOUNTER — Ambulatory Visit (HOSPITAL_COMMUNITY)
Admission: RE | Admit: 2015-05-08 | Discharge: 2015-05-08 | Disposition: A | Discharge status: Home / Self Care | Payer: Commercial Managed Care - HMO | Source: Ambulatory Visit | Attending: Family Medicine | Admitting: Family Medicine

## 2015-05-08 DIAGNOSIS — R918 Other nonspecific abnormal finding of lung field: Secondary | ICD-10-CM | POA: Insufficient documentation

## 2015-05-08 DIAGNOSIS — F17208 Nicotine dependence, unspecified, with other nicotine-induced disorders: Secondary | ICD-10-CM | POA: Diagnosis not present

## 2015-05-08 DIAGNOSIS — F17218 Nicotine dependence, cigarettes, with other nicotine-induced disorders: Secondary | ICD-10-CM | POA: Insufficient documentation

## 2015-05-08 DIAGNOSIS — R9389 Abnormal findings on diagnostic imaging of other specified body structures: Secondary | ICD-10-CM

## 2015-05-08 DIAGNOSIS — I2510 Atherosclerotic heart disease of native coronary artery without angina pectoris: Secondary | ICD-10-CM | POA: Diagnosis not present

## 2015-05-08 DIAGNOSIS — R938 Abnormal findings on diagnostic imaging of other specified body structures: Secondary | ICD-10-CM | POA: Insufficient documentation

## 2015-05-08 DIAGNOSIS — J439 Emphysema, unspecified: Secondary | ICD-10-CM | POA: Diagnosis not present

## 2015-05-08 MED ORDER — ALBUTEROL SULFATE (2.5 MG/3ML) 0.083% IN NEBU
2.5000 mg | INHALATION_SOLUTION | Freq: Once | RESPIRATORY_TRACT | Status: AC
  Administered 2015-05-08: 2.5 mg via RESPIRATORY_TRACT

## 2015-05-13 DIAGNOSIS — H2510 Age-related nuclear cataract, unspecified eye: Secondary | ICD-10-CM | POA: Diagnosis not present

## 2015-05-13 DIAGNOSIS — H5210 Myopia, unspecified eye: Secondary | ICD-10-CM | POA: Diagnosis not present

## 2015-05-15 LAB — PULMONARY FUNCTION TEST
DL/VA % PRED: 36 %
DL/VA: 1.93 ml/min/mmHg/L
DLCO UNC: 8.86 ml/min/mmHg
DLCO unc % pred: 29 %
FEF 25-75 POST: 1.62 L/s
FEF 25-75 Pre: 0.97 L/sec
FEF2575-%Change-Post: 68 %
FEF2575-%Pred-Post: 68 %
FEF2575-%Pred-Pre: 40 %
FEV1-%CHANGE-POST: 18 %
FEV1-%PRED-PRE: 79 %
FEV1-%Pred-Post: 93 %
FEV1-POST: 2.31 L
FEV1-PRE: 1.95 L
FEV1FVC-%Change-Post: 11 %
FEV1FVC-%PRED-PRE: 76 %
FEV6-%Change-Post: 6 %
FEV6-%PRED-POST: 111 %
FEV6-%PRED-PRE: 105 %
FEV6-POST: 3.39 L
FEV6-PRE: 3.19 L
FEV6FVC-%CHANGE-POST: 0 %
FEV6FVC-%PRED-POST: 101 %
FEV6FVC-%PRED-PRE: 102 %
FVC-%Change-Post: 6 %
FVC-%PRED-POST: 109 %
FVC-%PRED-PRE: 102 %
FVC-POST: 3.42 L
FVC-PRE: 3.22 L
PRE FEV6/FVC RATIO: 99 %
Post FEV1/FVC ratio: 67 %
Post FEV6/FVC ratio: 99 %
Pre FEV1/FVC ratio: 61 %
RV % PRED: 126 %
RV: 2.75 L
TLC % PRED: 105 %
TLC: 5.95 L

## 2015-05-19 ENCOUNTER — Encounter: Payer: Self-pay | Admitting: Family Medicine

## 2015-05-19 ENCOUNTER — Ambulatory Visit (INDEPENDENT_AMBULATORY_CARE_PROVIDER_SITE_OTHER): Payer: Commercial Managed Care - HMO | Admitting: Family Medicine

## 2015-05-19 VITALS — BP 126/68 | HR 90 | Resp 18 | Ht 68.0 in | Wt 154.0 lb

## 2015-05-19 DIAGNOSIS — Z0000 Encounter for general adult medical examination without abnormal findings: Secondary | ICD-10-CM | POA: Diagnosis not present

## 2015-05-19 DIAGNOSIS — J301 Allergic rhinitis due to pollen: Secondary | ICD-10-CM

## 2015-05-19 DIAGNOSIS — G4700 Insomnia, unspecified: Secondary | ICD-10-CM

## 2015-05-19 DIAGNOSIS — R7302 Impaired glucose tolerance (oral): Secondary | ICD-10-CM | POA: Diagnosis not present

## 2015-05-19 DIAGNOSIS — M25551 Pain in right hip: Secondary | ICD-10-CM

## 2015-05-19 DIAGNOSIS — F17218 Nicotine dependence, cigarettes, with other nicotine-induced disorders: Secondary | ICD-10-CM

## 2015-05-19 DIAGNOSIS — I10 Essential (primary) hypertension: Secondary | ICD-10-CM | POA: Diagnosis not present

## 2015-05-19 DIAGNOSIS — M549 Dorsalgia, unspecified: Secondary | ICD-10-CM | POA: Diagnosis not present

## 2015-05-19 DIAGNOSIS — M25552 Pain in left hip: Secondary | ICD-10-CM

## 2015-05-19 DIAGNOSIS — Z9109 Other allergy status, other than to drugs and biological substances: Secondary | ICD-10-CM | POA: Insufficient documentation

## 2015-05-19 MED ORDER — TEMAZEPAM 30 MG PO CAPS: 30.0000 mg | ORAL_CAPSULE | Freq: Every day | ORAL | Status: DC

## 2015-05-19 MED ORDER — DULOXETINE HCL 60 MG PO CPEP: 60.0000 mg | ORAL_CAPSULE | Freq: Every day | ORAL | Status: DC

## 2015-05-19 MED ORDER — KETOROLAC TROMETHAMINE 60 MG/2ML IM SOLN
60.0000 mg | Freq: Once | INTRAMUSCULAR | Status: AC
  Administered 2015-05-19: 60 mg via INTRAMUSCULAR

## 2015-05-19 MED ORDER — MONTELUKAST SODIUM 10 MG PO TABS: 10.0000 mg | ORAL_TABLET | Freq: Every day | ORAL | Status: DC

## 2015-05-19 MED ORDER — GABAPENTIN 600 MG PO TABS: 600.0000 mg | ORAL_TABLET | Freq: Two times a day (BID) | ORAL | Status: DC

## 2015-05-19 NOTE — Progress Notes (Signed)
Subjective:    Patient ID: Kelly Douglas, female    DOB: Aug 17, 1955, 60 y.o.   MRN: [id number]  HPI Preventive Screening-Counseling & Management   Patient present here today for a Medicare annual wellness visit. C/o increased and uncontrolled hip pain , wants injection today Pain has worsened in past 1 week, no aggravating factor C/o difficulty faling and staying asleep, mind wont shut down   Current Problems (verified)   Medications Prior to Visit Allergies (verified)   PAST HISTORY  Family History (updated)  Social History Disabled, married female mother of 2 sons    Risk Factors  Current exercise habits: Limited due arthritis pain   Dietary issues discussed:  Heart healthy low fat diet    Cardiac risk factors: nicotine Depression Screen  (Note: if answer to either of the following is "Yes", a more complete depression screening is indicated)   Over the past two weeks, have you felt down, depressed or hopeless? No  Over the past two weeks, have you felt little interest or pleasure in doing things? No  Have you lost interest or pleasure in daily life? No  Do you often feel hopeless? No  Do you cry easily over simple problems? No   Activities of Daily Living  In your present state of health, do you have any difficulty performing the following activities?   Driving?: Yes, due to syncope spells Managing money?: No Feeding yourself?:No Getting from bed to chair?:No Climbing a flight of stairs?: Yes, due to arthritis pain (able but limited)  Preparing food and eating?:No Bathing or showering?:No Getting dressed?:No Getting to the toilet?:No Using the toilet?:No Moving around from place to place?: Yes due to arthritis pain   Fall Risk Assessment In the past year have you fallen or had a near fall?:yes  Are you currently taking any medications that make you dizzy?:No   Hearing Difficulties: No Do you often ask people to speak up or repeat themselves?:No Do  you experience ringing or noises in your ears?:No Do you have difficulty understanding soft or whispered voices?:No  Cognitive Testing  Alert? Yes Normal Appearance?Yes  Oriented to person? Yes Place? Yes  Time? Yes  Displays appropriate judgment?Yes  Can read the correct time from a watch face? yes Are you having problems remembering things?No age appropriate  Advanced Directives have been discussed with the patient?Yes and brochure/forms provided , current full code    List the Names of Other Physician/Practitioners you currently use: careteams updated    Indicate any recent Medical Services you may have received from other than [facility] providers in the past year (date may be approximate).   Assessment:    Annual Wellness Exam   Plan:      Medicare Attestation  I have personally reviewed:  The patient's medical and social history  Their use of alcohol, tobacco or illicit drugs  Their current medications and supplements  The patient's functional ability including ADLs,fall risks, home safety risks, cognitive, and hearing and visual impairment  Diet and physical activities  Evidence for depression or mood disorders  The patient's weight, height, BMI, and visual acuity have been recorded in the chart. I have made referrals, counseling, and provided education to the patient based on review of the above and I have provided the patient with a written personalized care plan for preventive services.      Review of Systems     Objective:   Physical Exam  BP 126/68 mmHg  Pulse 90  Resp 18  Ht '5\' 8"'$  (1.727 m)  Wt 154 lb (69.854 kg)  BMI 23.42 kg/m2  SpO2 100%  MS: Decreased rOM spine and hips, normal in shoulders and knees     Assessment & Plan:  Medicare annual wellness visit, subsequent Annual exam as documented. Counseling done  re healthy lifestyle involving commitment to 150 minutes exercise per week, heart healthy diet, and attaining healthy weight.The importance  of adequate sleep also discussed. Regular seat belt use and home safety, is also discussed. Changes in health habits are decided on by the patient with goals and time frames  set for achieving them. Immunization and cancer screening needs are specifically addressed at this visit.   Insomnia Uncontrolled, start restoril Sleep hygiene reviewed  Nicotine dependence Patient counseled for approximately 5 minutes regarding the health risks of ongoing nicotine use, specifically all types of cancer, heart disease, stroke and respiratory failure. The options available for help with cessation ,the behavioral changes to assist the process, and the option to either gradully reduce usage  Or abruptly stop.is also discussed. Pt is also encouraged to set specific goals in number of cigarettes used daily, as well as to set a quit date.  Number of cigarettes/cigars currently smoking daily:10   Bilateral hip pain Uncontrolled x 1 week, toradol 60 mg iM in office

## 2015-05-19 NOTE — Assessment & Plan Note (Addendum)
Uncontrolled, start restoril Sleep hygiene reviewed

## 2015-05-19 NOTE — Assessment & Plan Note (Signed)

## 2015-05-19 NOTE — Assessment & Plan Note (Signed)

## 2015-05-19 NOTE — Patient Instructions (Addendum)
Annual physical exam in 4 month, call if you need me before  Please continue to cut back on cigarettes need to quit  Labs today  Meds as discussed New for sleep is restoril   Toradol 60 mg in office today for hip pain  Thank you  for choosing [facility]. We consider it a privelige to serve you.  Delivering excellent health care in a caring and  compassionate way is our goal.  Partnering with you,  so that together we can achieve this goal is our strategy.

## 2015-05-20 LAB — TSH: TSH: 0.59 mIU/L

## 2015-05-20 LAB — HEMOGLOBIN A1C
HEMOGLOBIN A1C: 5.7 % — AB (ref <5.7)
Mean Plasma Glucose: 117 mg/dL

## 2015-05-25 ENCOUNTER — Encounter: Payer: Self-pay | Admitting: Family Medicine

## 2015-05-25 NOTE — Assessment & Plan Note (Signed)
Uncontrolled x 1 week, toradol 60 mg iM in office

## 2015-05-29 LAB — BASIC METABOLIC PANEL: Glucose: 69 mg/dL

## 2015-06-09 DIAGNOSIS — F17200 Nicotine dependence, unspecified, uncomplicated: Secondary | ICD-10-CM | POA: Diagnosis not present

## 2015-06-09 DIAGNOSIS — J449 Chronic obstructive pulmonary disease, unspecified: Secondary | ICD-10-CM | POA: Diagnosis not present

## 2015-06-09 DIAGNOSIS — M349 Systemic sclerosis, unspecified: Secondary | ICD-10-CM | POA: Diagnosis not present

## 2015-06-09 DIAGNOSIS — I10 Essential (primary) hypertension: Secondary | ICD-10-CM | POA: Diagnosis not present

## 2015-06-16 ENCOUNTER — Telehealth: Payer: Self-pay | Admitting: Family Medicine

## 2015-06-16 DIAGNOSIS — G4700 Insomnia, unspecified: Secondary | ICD-10-CM

## 2015-06-16 MED ORDER — TEMAZEPAM 30 MG PO CAPS: 30.0000 mg | ORAL_CAPSULE | Freq: Every day | ORAL | Status: DC

## 2015-06-16 NOTE — Telephone Encounter (Signed)
Kelly Douglas is asking for a 3 month supply be called to [facility] of temazepam (RESTORIL) 30 MG capsule please advise?

## 2015-06-16 NOTE — Telephone Encounter (Signed)
Med refilled.

## 2015-06-25 ENCOUNTER — Encounter: Payer: Self-pay | Admitting: Internal Medicine

## 2015-09-02 ENCOUNTER — Other Ambulatory Visit: Payer: Self-pay | Admitting: Family Medicine

## 2015-09-02 DIAGNOSIS — F329 Major depressive disorder, single episode, unspecified: Secondary | ICD-10-CM

## 2015-09-02 DIAGNOSIS — F419 Anxiety disorder, unspecified: Principal | ICD-10-CM

## 2015-09-03 ENCOUNTER — Encounter: Payer: Self-pay | Admitting: Internal Medicine

## 2015-09-29 ENCOUNTER — Ambulatory Visit: Payer: Commercial Managed Care - HMO | Admitting: Internal Medicine

## 2015-10-09 ENCOUNTER — Encounter: Payer: Self-pay | Admitting: Internal Medicine

## 2015-10-09 ENCOUNTER — Ambulatory Visit (INDEPENDENT_AMBULATORY_CARE_PROVIDER_SITE_OTHER): Payer: Commercial Managed Care - HMO | Admitting: Internal Medicine

## 2015-10-09 VITALS — BP 115/72 | HR 86 | Temp 98.0°F | Ht 68.0 in | Wt 152.4 lb

## 2015-10-09 DIAGNOSIS — K219 Gastro-esophageal reflux disease without esophagitis: Secondary | ICD-10-CM

## 2015-10-09 DIAGNOSIS — R159 Full incontinence of feces: Secondary | ICD-10-CM | POA: Diagnosis not present

## 2015-10-09 DIAGNOSIS — K589 Irritable bowel syndrome without diarrhea: Secondary | ICD-10-CM

## 2015-10-09 NOTE — Patient Instructions (Signed)
Take a Bentyl 10 mg each morning  Begin Probiotic in the way of [employer] (for diarrhea) every day  Take Protonix 40 mg daily  GERD information  Office visit in 3 months  Let us know in 1 month if incontinence episodes continue

## 2015-10-09 NOTE — Progress Notes (Signed)
Primary Care Physician:  Tula Nakayama, MD Primary Gastroenterologist:  Dr. Gala Romney   Pre-Procedure History & Physical: HPI:  Kelly Douglas is a 60 y.o. female here for follow-up of  GERD, IBS along with intermittent fecal incontinence. IBS symptoms actually somewhat quiescent now. Occasional bouts of fecal incontinence  -   relatively new symptom. GERD symptoms well controlled the majority of the time. Denies dysphagia since her esophagus was dilated. No melena rectal bleeding. Not having any out and out diarrhea these days.  History of colonic adenoma; due for surveillance colonoscopy 2001.   Past Medical History:  Diagnosis Date  . Anxiety   . Arteriosclerotic cardiovascular disease (ASCVD) 2013   coronary calcification-left main, LAD and CX; small pericardial effusion  . Arthritis   . Chronic back pain   . Chronic bronchitis   . Chronic lung disease    Chronic scarring and volume loss-left lung; characteristics of a chronic infectious process-possible MAI  . Depression   . GERD (gastroesophageal reflux disease)    Dr Gala Romney EGD 09/2009->esophagitis, sm HH, antral erosions, atonic esophagus  . Hyperlipidemia   . Hypothyroid 1981 approx  . Scleroderma ([place])   . Seasonal allergies   . Syncope    Multiple spells over the past 40+ years, likely neurocardiogenic  . Tobacco abuse 06/24/2009    Past Surgical History:  Procedure Laterality Date  . BILATERAL SALPINGOOPHORECTOMY  2013    Dr. Elonda Husky; uterus remains in situ  . COLONOSCOPY  09/28/09   anal papilla otherwise normal  . COLONOSCOPY N/A 08/29/2012   FVC:BSWHQPR polyp-removed as described above. tubular adenoma  . ESOPHAGOGASTRODUODENOSCOPY  10/07/09   Dr. Barrie Dunker of esophageal mucosa, diffusely ?esophagitis (bx benign), small HH/antral erosions, erythema bx benign. 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 ESOPHAGOGASTRODUODENOSCOPY (EGD) WITH ESOPHAGEAL DILATION N/A 03/07/2012   FFM:[id number], patent,  tubular esophagus of uncertain significance-status post biopsy )unremarkable). Hiatal hernia  . LAPAROSCOPIC APPENDECTOMY  07/16/2011   Procedure: APPENDECTOMY LAPAROSCOPIC;  Surgeon: Donato Heinz, MD;  Location: [facility];  Service: General;  Laterality: N/A;  . TUBAL LIGATION      Prior to Admission medications   Medication Sig Start Date End Date Taking? Authorizing Provider  acetaminophen (TYLENOL) 500 MG tablet One tablet three times daily 05/19/15  Yes [name], MD  albuterol (PROVENTIL HFA;VENTOLIN HFA) 108 (90 BASE) MCG/ACT inhaler Inhale 2 puffs into the lungs every 6 (six) hours as needed. Shortness of breath 08/02/12  Yes [name], MD  amLODipine (NORVASC) 5 MG tablet TAKE 1 TABLET EVERY DAY 04/22/15  Yes [name], MD  busPIRone (BUSPAR) 7.5 MG tablet TAKE 1 TABLET (7.5 MG TOTAL) BY MOUTH 3 (THREE) TIMES DAILY. 09/04/15  Yes [name], MD  dicyclomine (BENTYL) 10 MG capsule Take 1 capsule (10 mg total) by mouth 3 (three) times daily with meals as needed for spasms. 08/11/14  Yes Annitta Needs, NP  DULoxetine (CYMBALTA) 60 MG capsule Take 1 capsule (60 mg total) by mouth daily. 05/19/15  Yes [name], MD  gabapentin (NEURONTIN) 600 MG tablet Take 1 tablet (600 mg total) by mouth 2 (two) times daily. 05/19/15  Yes [name], MD  levothyroxine (SYNTHROID, LEVOTHROID) 75 MCG tablet Take 1 tablet (75 mcg total) by mouth daily. 02/03/15  Yes [name], MD  montelukast (SINGULAIR) 10 MG tablet Take 1 tablet (10 mg total) by mouth at bedtime. 05/19/15  Yes [name], MD  Multiple Vitamins-Calcium (ONE-A-DAY WOMENS  PO) Take 1 tablet by mouth daily.   Yes Historical Provider, MD  pantoprazole (PROTONIX) 40 MG tablet Take 1 tablet (40 mg total) by mouth daily. 08/11/14  Yes Annitta Needs, NP  pravastatin (PRAVACHOL) 40 MG tablet TAKE 1 TABLET EVERY DAY 04/22/15  Yes [name], MD  temazepam (RESTORIL) 30 MG capsule Take 1 capsule  (30 mg total) by mouth at bedtime. 06/16/15  Yes [name], MD    Allergies as of 10/09/2015 - Review Complete 10/09/2015  Allergen Reaction Noted  . Aspirin Other (See Comments) 05/19/2015    Family History  Problem Relation Age of Onset  . Stroke Father   . Aneurysm Father   . Coronary artery disease Brother   . Hypertension Brother   . Down syndrome Brother   . Hypertension Brother   . Hypertension Brother     father/mother  . Anesthesia problems Neg Hx   . Hypotension Neg Hx   . Malignant hyperthermia Neg Hx   . Pseudochol deficiency Neg Hx   . Colon cancer Neg Hx   . Aneurysm Mother   . Throat cancer Brother 49    throat cancer  . Hypertension Sister   . Diabetes Brother   . Diabetes Brother     Social History   Social History  . Marital status: Married    Spouse name: N/A  . Number of children: 2  . Years of education: N/A   Occupational History  . disabled Not Employed   Social History Main Topics  . Smoking status: Current Every Day Smoker    Packs/day: 1.50    Years: 40.00    Types: Cigarettes    Start date: 07/14/1971  . Smokeless tobacco: Never Used     Comment: 0.5 PPD current plans to reduce   . Alcohol use No     Comment: quit June 2012-used to drink 6 pack daily  . Drug use: No  . Sexual activity: Yes    Birth control/ protection: Post-menopausal   Other Topics Concern  . Not on file   Social History Narrative  . No narrative on file    Review of Systems: See HPI, otherwise negative ROS  Physical Exam: BP 115/72   Pulse 86   Temp 98 F (36.7 C) (Oral)   Ht '5\' 8"'$  (1.727 m)   Wt 152 lb 6.4 oz (69.1 kg)   BMI 23.17 kg/m  General:   Alert,   pleasant and cooperative in NAD Skin:  Intact without significant lesions or rashes. Neck:  Supple; no masses or thyromegaly. No significant cervical adenopathy. Lungs:  Clear throughout to auscultation.   No wheezes, crackles, or rhonchi. No acute distress. Heart:  Regular rate and  rhythm; no murmurs, clicks, rubs,  or gallops. Abdomen: Non-distended, normal bowel sounds.  Soft and nontender without appreciable mass or hepatosplenomegaly.  Pulses:  Normal pulses noted. Extremities:  Without clubbing or edema.  Impression:  60 year old lady with scleroderma, GERD, IBS along with intermittent fecal incontinence. IBS symptoms actually somewhat quiescent now. Occasional bouts of fecal incontinence  -   relatively new symptom. This is nonspecific and may be related to scleroderma and/or delayed effects of childbirth. GERD symptoms well controlled the majority of the time.  History of colonic adenoma; due for surveillance colonoscopy 2001.  Recommendations:  Take a Bentyl 10 mg each morning diagnosis we'll slow her bowels down just a bit in the hopes it will diminish episodes of incontinence.  Begin Probiotic in  the way of [employer] (for diarrhea) every day  Take Protonix 40 mg daily  GERD information  Office visit in 3 months  Pt to let us know in 1 month if incontinence episodes continue; if not any better in one month, we'll consider off label use of resin binder such as cholestyramine.  Surveillance colonoscopy 2020     Notice: This dictation was prepared with Dragon dictation along with smaller phrase technology. Any transcriptional errors that result from this process are unintentional and may not be corrected upon review.

## 2015-10-27 ENCOUNTER — Ambulatory Visit (INDEPENDENT_AMBULATORY_CARE_PROVIDER_SITE_OTHER): Payer: Commercial Managed Care - HMO | Admitting: Family Medicine

## 2015-10-27 ENCOUNTER — Encounter: Payer: Self-pay | Admitting: Family Medicine

## 2015-10-27 ENCOUNTER — Ambulatory Visit (HOSPITAL_COMMUNITY)
Admission: RE | Admit: 2015-10-27 | Discharge: 2015-10-27 | Disposition: A | Discharge status: Home / Self Care | Payer: Commercial Managed Care - HMO | Source: Ambulatory Visit | Attending: Family Medicine | Admitting: Family Medicine

## 2015-10-27 VITALS — BP 120/70 | HR 96 | Ht 68.0 in | Wt 156.0 lb

## 2015-10-27 DIAGNOSIS — F17200 Nicotine dependence, unspecified, uncomplicated: Secondary | ICD-10-CM | POA: Diagnosis not present

## 2015-10-27 DIAGNOSIS — E559 Vitamin D deficiency, unspecified: Secondary | ICD-10-CM | POA: Diagnosis not present

## 2015-10-27 DIAGNOSIS — Z9109 Other allergy status, other than to drugs and biological substances: Secondary | ICD-10-CM

## 2015-10-27 DIAGNOSIS — Z1211 Encounter for screening for malignant neoplasm of colon: Secondary | ICD-10-CM | POA: Diagnosis not present

## 2015-10-27 DIAGNOSIS — J301 Allergic rhinitis due to pollen: Secondary | ICD-10-CM

## 2015-10-27 DIAGNOSIS — Z0000 Encounter for general adult medical examination without abnormal findings: Secondary | ICD-10-CM

## 2015-10-27 DIAGNOSIS — I10 Essential (primary) hypertension: Secondary | ICD-10-CM

## 2015-10-27 DIAGNOSIS — M25572 Pain in left ankle and joints of left foot: Secondary | ICD-10-CM | POA: Insufficient documentation

## 2015-10-27 DIAGNOSIS — M549 Dorsalgia, unspecified: Secondary | ICD-10-CM

## 2015-10-27 DIAGNOSIS — F17218 Nicotine dependence, cigarettes, with other nicotine-induced disorders: Secondary | ICD-10-CM

## 2015-10-27 DIAGNOSIS — E785 Hyperlipidemia, unspecified: Secondary | ICD-10-CM

## 2015-10-27 DIAGNOSIS — J449 Chronic obstructive pulmonary disease, unspecified: Secondary | ICD-10-CM | POA: Diagnosis not present

## 2015-10-27 DIAGNOSIS — M349 Systemic sclerosis, unspecified: Secondary | ICD-10-CM | POA: Diagnosis not present

## 2015-10-27 DIAGNOSIS — Z23 Encounter for immunization: Secondary | ICD-10-CM | POA: Diagnosis not present

## 2015-10-27 DIAGNOSIS — G4700 Insomnia, unspecified: Secondary | ICD-10-CM

## 2015-10-27 DIAGNOSIS — R7301 Impaired fasting glucose: Secondary | ICD-10-CM | POA: Diagnosis not present

## 2015-10-27 DIAGNOSIS — E038 Other specified hypothyroidism: Secondary | ICD-10-CM

## 2015-10-27 DIAGNOSIS — M7989 Other specified soft tissue disorders: Secondary | ICD-10-CM | POA: Diagnosis not present

## 2015-10-27 LAB — POC HEMOCCULT BLD/STL (OFFICE/1-CARD/DIAGNOSTIC): FECAL OCCULT BLD: NEGATIVE

## 2015-10-27 MED ORDER — TEMAZEPAM 30 MG PO CAPS: 30.0000 mg | ORAL_CAPSULE | Freq: Every day | ORAL | 1 refills | Status: DC

## 2015-10-27 MED ORDER — KETOROLAC TROMETHAMINE 60 MG/2ML IM SOLN
60.0000 mg | Freq: Once | INTRAMUSCULAR | Status: AC
  Administered 2015-10-27: 60 mg via INTRAMUSCULAR

## 2015-10-27 MED ORDER — DICYCLOMINE HCL 10 MG PO CAPS: 10.0000 mg | ORAL_CAPSULE | Freq: Three times a day (TID) | ORAL | 3 refills | Status: DC | PRN

## 2015-10-27 MED ORDER — METHYLPREDNISOLONE ACETATE 80 MG/ML IJ SUSP
80.0000 mg | Freq: Once | INTRAMUSCULAR | Status: AC
  Administered 2015-10-27: 80 mg via INTRAMUSCULAR

## 2015-10-27 MED ORDER — MONTELUKAST SODIUM 10 MG PO TABS: 10.0000 mg | ORAL_TABLET | Freq: Every day | ORAL | 1 refills | Status: DC

## 2015-10-27 MED ORDER — AMLODIPINE BESYLATE 5 MG PO TABS: 5.0000 mg | ORAL_TABLET | Freq: Every day | ORAL | 1 refills | Status: DC

## 2015-10-27 MED ORDER — PRAVASTATIN SODIUM 40 MG PO TABS: 40.0000 mg | ORAL_TABLET | Freq: Every day | ORAL | 1 refills | Status: DC

## 2015-10-27 NOTE — Progress Notes (Signed)
Kelly Douglas     MRN: [id number]      DOB: 1955/10/16  HPI: Patient is in for annual physical exam. C/o increased and uncontrolled back pain and new left ankle pain and swelling, no aggravating trauma Recent labs, if available are reviewed. Immunization is reviewed , and  updated    PE: Pleasant  female, alert and oriented x 3, in no cardio-pulmonary distress. Afebrile. HEENT No facial trauma or asymetry. Sinuses non tender.  Extra occullar muscles intact, pupils equally reactive to light. External ears normal, tympanic membranes clear. Oropharynx moist, no exudate. Neck: supple, no adenopathy,JVD or thyromegaly.No bruits.  Chest: Clear to ascultation bilaterally.No crackles or wheezes.Decreased though adequate air entry.Non tender to palpation  Breast: No asymetry,no masses or lumps. No tenderness. No nipple discharge or inversion. No axillary or supraclavicular adenopathy  Cardiovascular system; Heart sounds normal,  S1 and  S2 ,no S3.  No murmur, or thrill. Apical beat not displaced Peripheral pulses normal.  Abdomen: Soft, non tender, no organomegaly or masses. No bruits. Bowel sounds normal. No guarding, tenderness or rebound.  Rectal:  Normal sphincter tone. No rectal mass. Guaiac negative stool.  GU: External genitalia normal female genitalia , normal female distribution of hair. No lesions. Urethral meatus normal in size, no  Prolapse, no lesions visibly  Present. Bladder non tender. Vagina pink and moist , with no visible lesions , discharge present . Adequate pelvic support no  cystocele or rectocele noted Cervix pink and appears healthy, no lesions or ulcerations noted, no discharge noted from os Uterus normal size, no adnexal masses, no cervical motion or adnexal tenderness.   Musculoskeletal exam: Decreased ROM of spine, normal in  hips , shoulders and knees. Left ankle swollen with tenderness on lateral malleolus and reduced ROM No muscle  wasting or atrophy.   Neurologic: Cranial nerves 2 to 12 intact. Power, tone ,sensation and reflexes normal throughout. No disturbance in gait. No tremor.  Skin: Intact, no ulceration, erythema , scaling or rash noted. Pigmentation normal throughout  Psych; Normal mood and affect. Judgement and concentration normal   Assessment & Plan:  Annual physical exam Annual exam as documented. Counseling done  re healthy lifestyle involving commitment to 150 minutes exercise per week, heart healthy diet, and attaining healthy weight.The importance of adequate sleep also discussed. Regular seat belt use and home safety, is also discussed. Changes in health habits are decided on by the patient with goals and time frames  set for achieving them. Immunization and cancer screening needs are specifically addressed at this visit.   Essential hypertension Controlled, no change in medication DASH diet and commitment to daily physical activity for a minimum of 30 minutes discussed and encouraged, as a part of hypertension management. The importance of attaining a healthy weight is also discussed.  BP/Weight 10/27/2015 10/09/2015 05/29/2015 05/19/2015 02/03/2015 6/81/1572 06/18/353  Systolic BP 974 163 845 364 680 321 224  Diastolic BP 70 72 82 68 80 68 78  Wt. (Lbs) 156 152.4 - 154 153.12 144 144  BMI 23.72 23.17 - 23.42 23.29 21.9 21.9       Back pain with radiation Uncontrolled x 2 weeks Uncontrolled.Toradol and depo medrol administered IM in the office ,  Ankle pain, left Acute left ankle pain and swelling with limited mobility, needs x ray today and also ant inflammatory treatment  Nicotine dependence Patient counseled for approximately 5 minutes regarding the health risks of ongoing nicotine use, specifically all types of cancer, heart disease,  stroke and respiratory failure. The options available for help with cessation ,the behavioral changes to assist the process, and the option to  either gradully reduce usage  Or abruptly stop.is also discussed. Pt is also encouraged to set specific goals in number of cigarettes used daily, as well as to set a quit date.

## 2015-10-27 NOTE — Assessment & Plan Note (Signed)
Uncontrolled x 2 weeks Uncontrolled.Toradol and depo medrol administered IM in the office ,

## 2015-10-27 NOTE — Patient Instructions (Addendum)
F/u in 4.5 month, call if you need me sooner  Flu vaccine today  Injections in office today for back and left ankle pain  X ray of left ankle today  NEED TO QUIT SMOKING goal is in 9 weeks, start 9 per day as of tomorrow.\\Labs today    You Can Quit Smoking If you are ready to quit smoking or are thinking about it, congratulations! You have chosen to help yourself be healthier and live longer! There are lots of different ways to quit smoking. Nicotine gum, nicotine patches, a nicotine inhaler, or nicotine nasal spray can help with physical craving. Hypnosis, support groups, and medicines help break the habit of smoking. TIPS TO GET OFF AND STAY OFF CIGARETTES  Learn to predict your moods. Do not let a bad situation be your excuse to have a cigarette. Some situations in your life might tempt you to have a cigarette.  Ask friends and co-workers not to smoke around you.  Make your home smoke-free.  Never have "just one" cigarette. It leads to wanting another and another. Remind yourself of your decision to quit.  On a card, make a list of your reasons for not smoking. Read it at least the same number of times a day as you have a cigarette. Tell yourself everyday, "I do not want to smoke. I choose not to smoke."  Ask someone at home or work to help you with your plan to quit smoking.  Have something planned after you eat or have a cup of coffee. Take a walk or get other exercise to perk you up. This will help to keep you from overeating.  Try a relaxation exercise to calm you down and decrease your stress. Remember, you may be tense and nervous the first two weeks after you quit. This will pass.  Find new activities to keep your hands busy. Play with a pen, coin, or rubber band. Doodle or draw things on paper.  Brush your teeth right after eating. This will help cut down the craving for the taste of tobacco after meals. You can try mouthwash too.  Try gum, breath mints, or diet candy  to keep something in your mouth. IF YOU SMOKE AND WANT TO QUIT:  Do not stock up on cigarettes. Never buy a carton. Wait until one pack is finished before you buy another.  Never carry cigarettes with you at work or at home.  Keep cigarettes as far away from you as possible. Leave them with someone else.  Never carry matches or a lighter with you.  Ask yourself, "Do I need this cigarette or is this just a reflex?"  Bet with someone that you can quit. Put cigarette money in a piggy bank every morning. If you smoke, you give up the money. If you do not smoke, by the end of the week, you keep the money.  Keep trying. It takes 21 days to change a habit!  Talk to your doctor about using medicines to help you quit. These include nicotine replacement gum, lozenges, or skin patches.   This information is not intended to replace advice given to you by your health care provider. Make sure you discuss any questions you have with your health care provider.   Document Released: 10/30/2008 Document Revised: 03/28/2011 Document Reviewed: 10/30/2008 Elsevier Interactive Patient Education 2016 [place] DASH stands for "Dietary Approaches to Stop Hypertension." The DASH eating plan is a healthy eating plan that has been shown to reduce  high blood pressure (hypertension). Additional health benefits may include reducing the risk of type 2 diabetes mellitus, heart disease, and stroke. The DASH eating plan may also help with weight loss. WHAT DO I NEED TO KNOW ABOUT THE DASH EATING PLAN? For the DASH eating plan, you will follow these general guidelines:  Choose foods with a percent daily value for sodium of less than 5% (as listed on the food label).  Use salt-free seasonings or herbs instead of table salt or sea salt.  Check with your health care provider or pharmacist before using salt substitutes.  Eat lower-sodium products, often labeled as "lower sodium" or "no salt  added."  Eat fresh foods.  Eat more vegetables, fruits, and low-fat dairy products.  Choose whole grains. Look for the word "whole" as the first word in the ingredient list.  Choose fish and skinless chicken or [place] more often than red meat. Limit fish, poultry, and meat to 6 oz (170 g) each day.  Limit sweets, desserts, sugars, and sugary drinks.  Choose heart-healthy fats.  Limit cheese to 1 oz (28 g) per day.  Eat more home-cooked food and less restaurant, buffet, and fast food.  Limit fried foods.  Cook foods using methods other than frying.  Limit canned vegetables. If you do use them, rinse them well to decrease the sodium.  When eating at a restaurant, ask that your food be prepared with less salt, or no salt if possible. WHAT FOODS CAN I EAT? Seek help from a dietitian for individual calorie needs. Grains Whole grain or whole wheat bread. Brown rice. Whole grain or whole wheat pasta. Quinoa, bulgur, and whole grain cereals. Low-sodium cereals. Corn or whole wheat flour tortillas. Whole grain cornbread. Whole grain crackers. Low-sodium crackers. Vegetables Fresh or frozen vegetables (raw, steamed, roasted, or grilled). Low-sodium or reduced-sodium tomato and vegetable juices. Low-sodium or reduced-sodium tomato sauce and paste. Low-sodium or reduced-sodium canned vegetables.  Fruits All fresh, canned (in natural juice), or frozen fruits. Meat and Other Protein Products Ground beef (85% or leaner), grass-fed beef, or beef trimmed of fat. Skinless chicken or [place]. Ground chicken or [place]. Pork trimmed of fat. All fish and seafood. Eggs. Dried beans, peas, or lentils. Unsalted nuts and seeds. Unsalted canned beans. Dairy Low-fat dairy products, such as skim or 1% milk, 2% or reduced-fat cheeses, low-fat ricotta or cottage cheese, or plain low-fat yogurt. Low-sodium or reduced-sodium cheeses. Fats and Oils Tub margarines without trans fats. Light or reduced-fat  mayonnaise and salad dressings (reduced sodium). Avocado. Safflower, olive, or canola oils. Natural peanut or almond butter. Other Unsalted popcorn and pretzels. The items listed above may not be a complete list of recommended foods or beverages. Contact your dietitian for more options. WHAT FOODS ARE NOT RECOMMENDED? Grains White bread. White pasta. White rice. Refined cornbread. Bagels and croissants. Crackers that contain trans fat. Vegetables Creamed or fried vegetables. Vegetables in a cheese sauce. Regular canned vegetables. Regular canned tomato sauce and paste. Regular tomato and vegetable juices. Fruits Dried fruits. Canned fruit in light or heavy syrup. Fruit juice. Meat and Other Protein Products Fatty cuts of meat. Ribs, chicken wings, bacon, sausage, bologna, salami, chitterlings, fatback, hot dogs, bratwurst, and packaged luncheon meats. Salted nuts and seeds. Canned beans with salt. Dairy Whole or 2% milk, cream, half-and-half, and cream cheese. Whole-fat or sweetened yogurt. Full-fat cheeses or blue cheese. Nondairy creamers and whipped toppings. Processed cheese, cheese spreads, or cheese curds. Condiments Onion and garlic salt, seasoned salt, table salt,  and sea salt. Canned and packaged gravies. Worcestershire sauce. Tartar sauce. Barbecue sauce. Teriyaki sauce. Soy sauce, including reduced sodium. Steak sauce. Fish sauce. Oyster sauce. Cocktail sauce. Horseradish. Ketchup and mustard. Meat flavorings and tenderizers. Bouillon cubes. Hot sauce. Tabasco sauce. Marinades. Taco seasonings. Relishes. Fats and Oils Butter, stick margarine, lard, shortening, ghee, and bacon fat. Coconut, palm kernel, or palm oils. Regular salad dressings. Other Pickles and olives. Salted popcorn and pretzels. The items listed above may not be a complete list of foods and beverages to avoid. Contact your dietitian for more information. WHERE CAN I FIND MORE INFORMATION? National Heart, Lung, and  Blood Institute: travelstabloid.com   This information is not intended to replace advice given to you by your health care provider. Make sure you discuss any questions you have with your health care provider.   Document Released: 12/23/2010 Document Revised: 01/24/2014 Document Reviewed: 11/07/2012 Elsevier Interactive Patient Education [employer].

## 2015-10-27 NOTE — Assessment & Plan Note (Signed)

## 2015-10-27 NOTE — Assessment & Plan Note (Signed)
Controlled, no change in medication DASH diet and commitment to daily physical activity for a minimum of 30 minutes discussed and encouraged, as a part of hypertension management. The importance of attaining a healthy weight is also discussed.  BP/Weight 10/27/2015 10/09/2015 05/29/2015 05/19/2015 02/03/2015 3/37/4451 04/22/477  Systolic BP 987 215 872 761 848 592 763  Diastolic BP 70 72 82 68 80 68 78  Wt. (Lbs) 156 152.4 - 154 153.12 144 144  BMI 23.72 23.17 - 23.42 23.29 21.9 21.9

## 2015-10-27 NOTE — Assessment & Plan Note (Signed)
Acute left ankle pain and swelling with limited mobility, needs x ray today and also ant inflammatory treatment

## 2015-10-27 NOTE — Assessment & Plan Note (Signed)

## 2015-10-28 LAB — LIPID PANEL
CHOLESTEROL: 132 mg/dL (ref 125–200)
HDL: 30 mg/dL — AB (ref >=46)
LDL CALC: 63 mg/dL (ref <130)
TRIGLYCERIDES: 195 mg/dL — AB (ref <150)
Total CHOL/HDL Ratio: 4.4 Ratio (ref <=5.0)
VLDL: 39 mg/dL — AB (ref <30)

## 2015-10-28 LAB — HEMOGLOBIN A1C
Hgb A1c MFr Bld: 5.1 % (ref <5.7)
MEAN PLASMA GLUCOSE: 100 mg/dL

## 2015-10-28 LAB — COMPREHENSIVE METABOLIC PANEL
ALBUMIN: 4.5 g/dL (ref 3.6–5.1)
ALK PHOS: 100 U/L (ref 33–130)
ALT: 15 U/L (ref 6–29)
AST: 21 U/L (ref 10–35)
BILIRUBIN TOTAL: 0.4 mg/dL (ref 0.2–1.2)
BUN: 5 mg/dL — ABNORMAL LOW (ref 7–25)
CALCIUM: 9.9 mg/dL (ref 8.6–10.4)
CO2: 26 mmol/L (ref 20–31)
CREATININE: 0.82 mg/dL (ref 0.50–0.99)
Chloride: 105 mmol/L (ref 98–110)
GLUCOSE: 78 mg/dL (ref 65–99)
Potassium: 4 mmol/L (ref 3.5–5.3)
SODIUM: 140 mmol/L (ref 135–146)
Total Protein: 7 g/dL (ref 6.1–8.1)

## 2015-10-28 LAB — TSH: TSH: 1.45 mIU/L

## 2015-10-28 LAB — VITAMIN D 25 HYDROXY (VIT D DEFICIENCY, FRACTURES): VIT D 25 HYDROXY: 42 ng/mL (ref 30–100)

## 2015-11-03 ENCOUNTER — Other Ambulatory Visit: Payer: Self-pay | Admitting: Gastroenterology

## 2015-11-26 ENCOUNTER — Telehealth: Payer: Self-pay

## 2015-11-26 NOTE — Telephone Encounter (Signed)
Communication noted.  Lets try questran 2 grams daily - not to be taken wn 2 hrs of other meds - try for a week, if it looks like it is helping, inc to 4 grams daily  Disp 30 doses w 11 refills

## 2015-11-26 NOTE — Telephone Encounter (Signed)
Pt called- she said she has taken the bentyl and the probiotic as directed and she is still not any better. She is still having the incontinence and wants to know if there is anything else she can do for it?

## 2015-11-27 NOTE — Telephone Encounter (Signed)
Pt is aware. rx has been called in to [facility] in [place]. Pt will try it and if it is working well for her she will call back to have it sent to her mail order pharmacy.

## 2015-11-30 ENCOUNTER — Encounter: Payer: Self-pay | Admitting: Internal Medicine

## 2015-12-06 IMAGING — CT CT ABD-PELV W/ CM
2 of 4 series · 16 of 46 positions shown, 18 images · IV contrast (Omnipaque 300)
Comparison: Abdominal pelvic CT 07/16/2011.

CLINICAL DATA: Diarrhea with right sided palpable knot for years.
History of appendectomy and possible hysterectomy.

EXAM:
CT ABDOMEN AND PELVIS WITH CONTRAST
TECHNIQUE: Multidetector CT imaging of the abdomen and pelvis was performed
using the standard protocol following bolus administration of
intravenous contrast.
CONTRAST:  100mL OMNIPAQUE IOHEXOL 300 MG/ML  SOLN

[Series 2: abd_pel_with 5.0 b40f · axial · 0.70mm/px · z∈[-398,-32]mm · 13 of 81 slices shown, 15 images]
[im 4/81  soft-tissue]
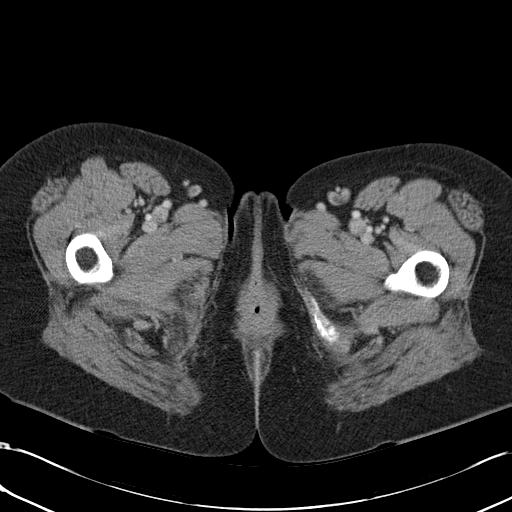
[im 4/81  bone]
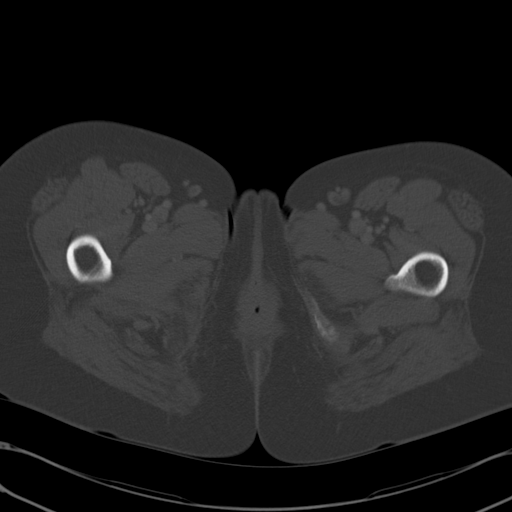
[im 11/81  soft-tissue]
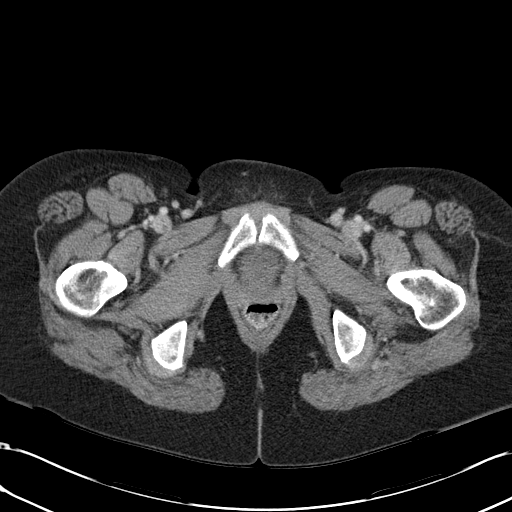
[im 19/81  soft-tissue]
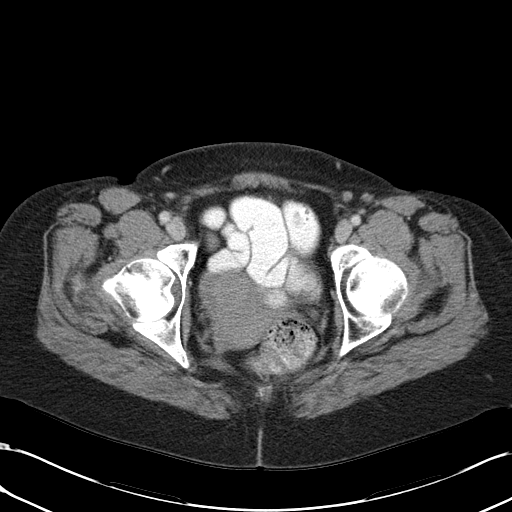
[im 22/81  soft-tissue]
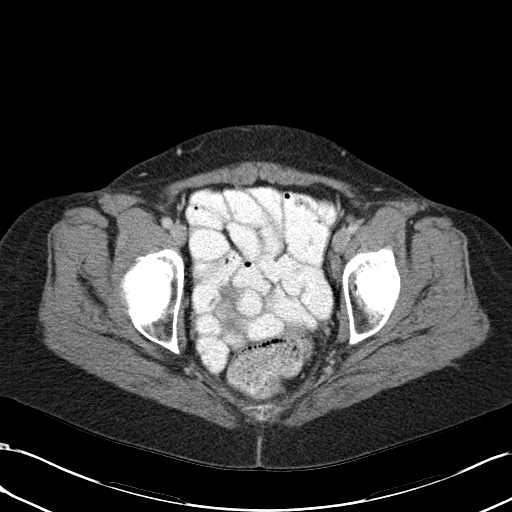
[im 30/81  soft-tissue]
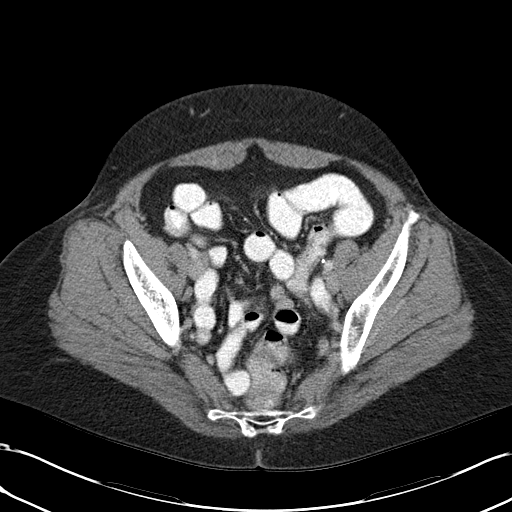
[im 33/81  soft-tissue]
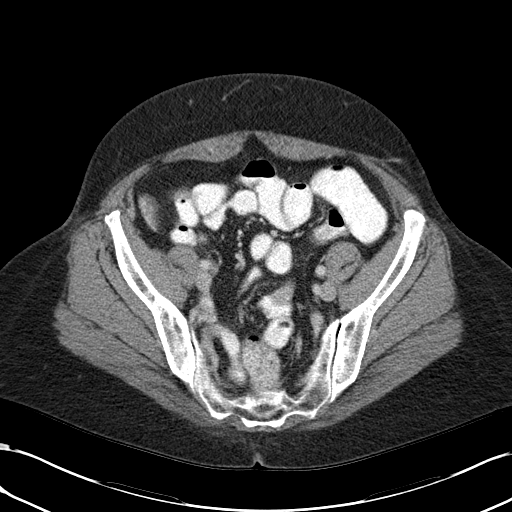
[im 41/81  soft-tissue]
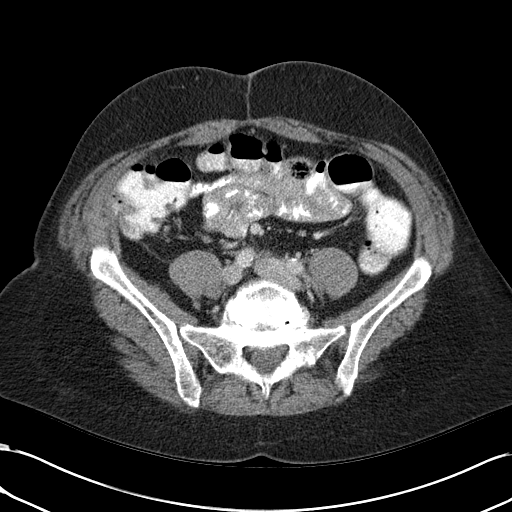
[im 48/81  soft-tissue]
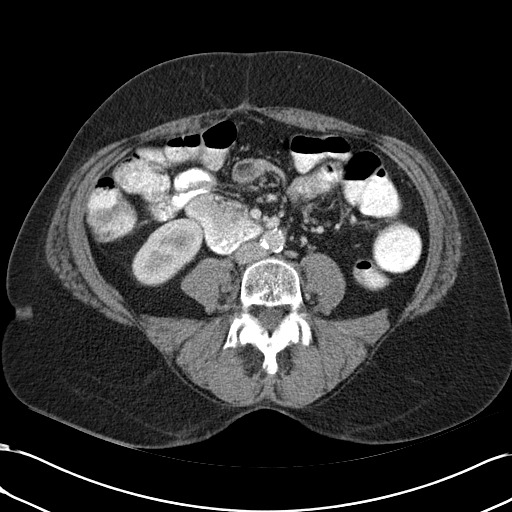
[im 51/81  soft-tissue]
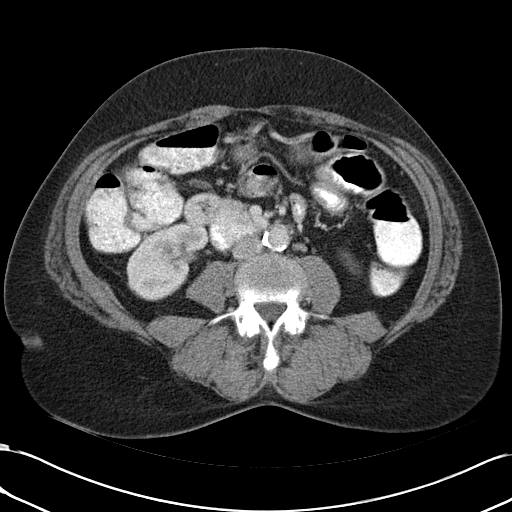
[im 51/81  bone]
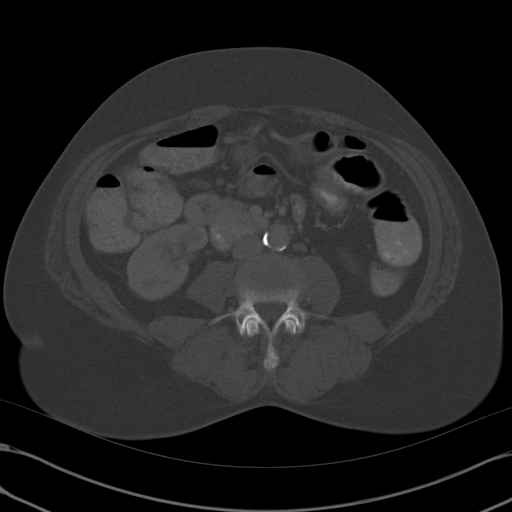
[im 59/81  soft-tissue]
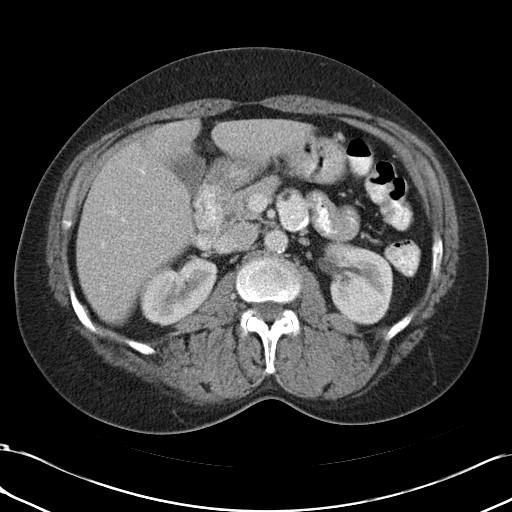
[im 62/81  soft-tissue]
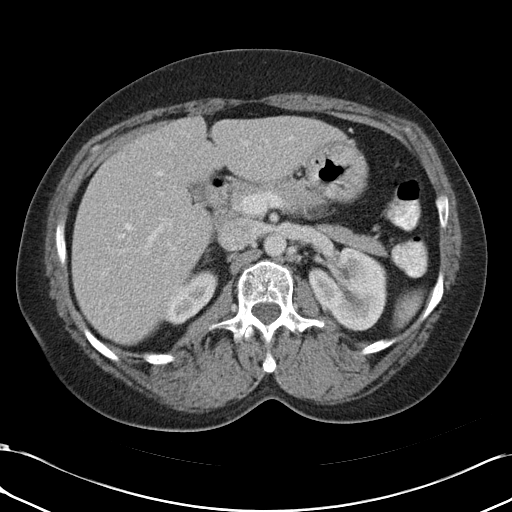
[im 70/81  soft-tissue]
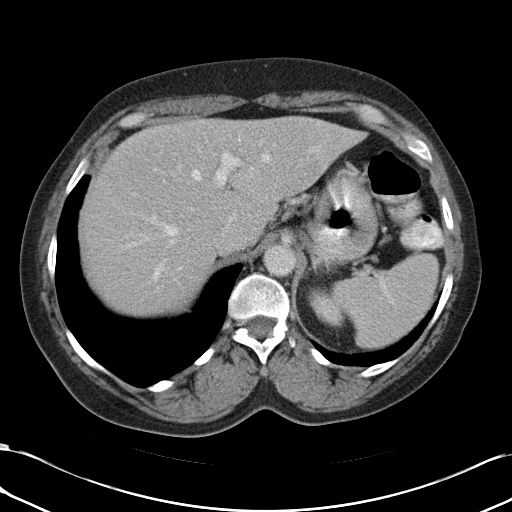
[im 77/81  soft-tissue]
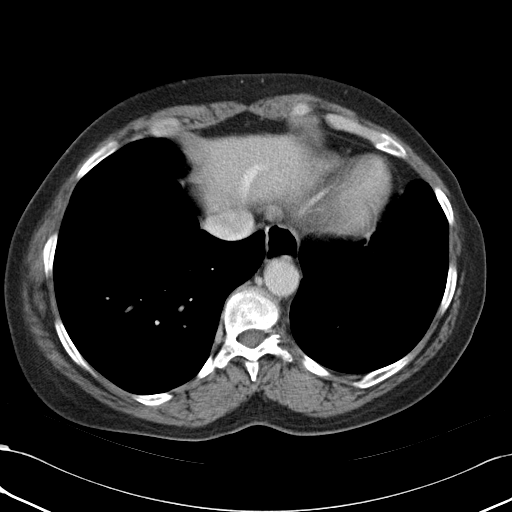

[Series 4: abd_pel_with 3.0 spo · coronal · 0.63mm/px · 3 of 93 slices shown]
[im 31/93  soft-tissue]
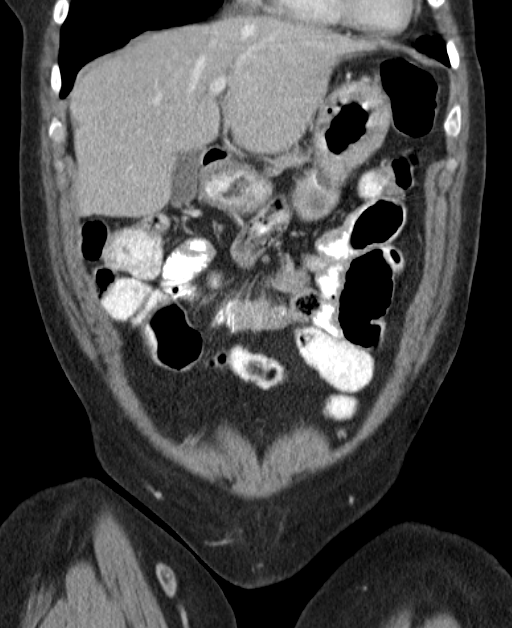
[im 41/93  soft-tissue]
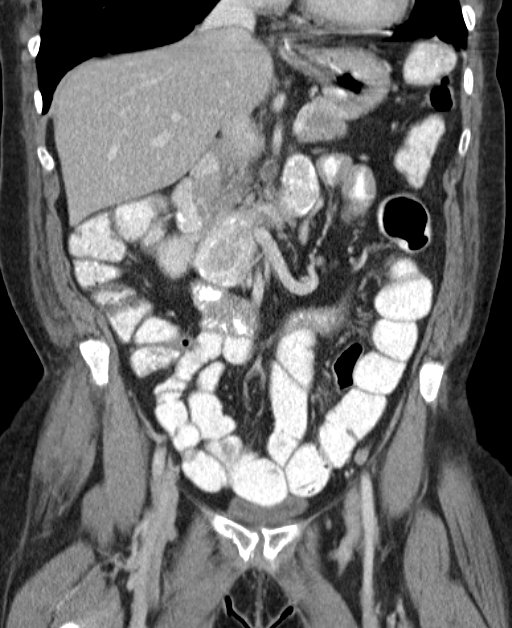
[im 52/93  soft-tissue]
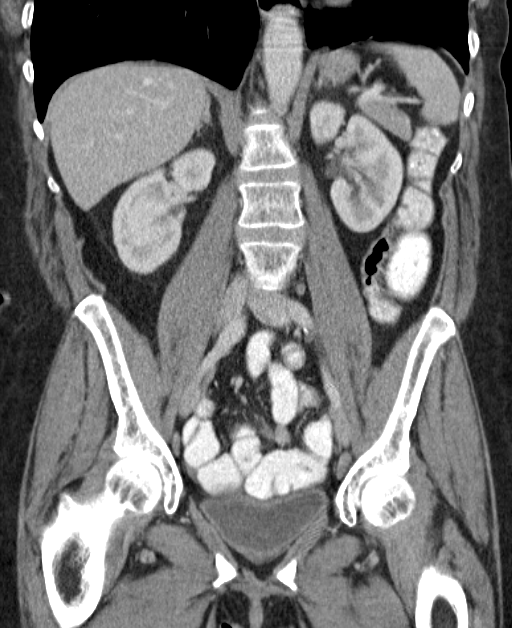

[16 of 46 positions shown; findings below may reference images not displayed]

FINDINGS: The visualized lung bases are clear. There is no significant pleural
or pericardial effusion. The distal esophagus is patulous as noted
previously.

The liver, gallbladder, biliary system and pancreas appear normal.
The spleen, adrenal glands and kidneys appear normal.

The stomach is incompletely distended, but demonstrates no definite
abnormality. The small bowel and colon appear normal. The appendix
is surgically absent. There is mild aortoiliac atherosclerosis
without evidence of aneurysm. No enlarged lymph nodes or
inflammatory changes are identified. Small external iliac and
inguinal lymph nodes are unchanged.

The uterus is present. There is no adnexal mass. The bladder appears
normal. No abdominal wall masses or hernias are seen.

There is advanced degenerative disc disease with endplate sclerosis
and vacuum phenomenon at L5-S1. No acute osseous findings are
demonstrated.
IMPRESSION: 1. Interval appendectomy without demonstrated complication.
2. No evidence of abdominal wall mass or hernia.
3. Mild aortoiliac atherosclerosis.
4. Chronic degenerative disc disease at L5-S1.

## 2015-12-22 ENCOUNTER — Ambulatory Visit (INDEPENDENT_AMBULATORY_CARE_PROVIDER_SITE_OTHER): Payer: Commercial Managed Care - HMO | Admitting: Internal Medicine

## 2015-12-22 ENCOUNTER — Encounter: Payer: Self-pay | Admitting: Internal Medicine

## 2015-12-22 VITALS — BP 126/80 | HR 96 | Temp 98.1°F | Ht 68.0 in | Wt 155.2 lb

## 2015-12-22 DIAGNOSIS — R151 Fecal smearing: Secondary | ICD-10-CM | POA: Diagnosis not present

## 2015-12-22 DIAGNOSIS — K219 Gastro-esophageal reflux disease without esophagitis: Secondary | ICD-10-CM | POA: Diagnosis not present

## 2015-12-22 MED ORDER — PANTOPRAZOLE SODIUM 40 MG PO TBEC: 40.0000 mg | DELAYED_RELEASE_TABLET | Freq: Two times a day (BID) | ORAL | 5 refills | Status: DC

## 2015-12-22 NOTE — Patient Instructions (Addendum)
Continue Pantoprazole 40 mg daily; may take a second dose occasionally as needed  Use Questran -  1/2 scoop daily  Stop taking Bentyl  Colonoscopy in 6 months  Office visit with [place] in 6 months

## 2015-12-22 NOTE — Progress Notes (Signed)
Primary Care Physician:  Tula Nakayama, MD Primary Gastroenterologist:  Dr. Gala Romney  Pre-Procedure History & Physical: HPI:  Kelly Douglas is a 60 y.o. female here for follow-up of an IBS D/fecal incontinence. Was taking a half a scoop of Questran daily developed transient diarrhea;  we increased to 1 scoop daily; she no longer has the fecal leakage/seepage. In fact, she has had some constipation recently, having to strain to produce a BM.  Reflux symptoms well controlled on Protonix 40 mg daily-sometimes has to take a second dose. Not currently taking every day.  History of colonic adenoma; due for surveillance 2021.  Past Medical History:  Diagnosis Date  . Anxiety   . Arteriosclerotic cardiovascular disease (ASCVD) 2013   coronary calcification-left main, LAD and CX; small pericardial effusion  . Arthritis   . Chronic back pain   . Chronic bronchitis   . Chronic lung disease    Chronic scarring and volume loss-left lung; characteristics of a chronic infectious process-possible MAI  . Depression   . GERD (gastroesophageal reflux disease)    Dr Gala Romney EGD 09/2009->esophagitis, sm HH, antral erosions, atonic esophagus  . Hyperlipidemia   . Hypothyroid 1981 approx  . Scleroderma ([place])   . Seasonal allergies   . Syncope    Multiple spells over the past 40+ years, likely neurocardiogenic  . Tobacco abuse 06/24/2009    Past Surgical History:  Procedure Laterality Date  . BILATERAL SALPINGOOPHORECTOMY  2013    Dr. Elonda Husky; uterus remains in situ  . COLONOSCOPY  09/28/09   anal papilla otherwise normal  . COLONOSCOPY N/A 08/29/2012   [id number] polyp-removed as described above. tubular adenoma  . ESOPHAGOGASTRODUODENOSCOPY  10/07/09   Dr. Barrie Dunker of esophageal mucosa, diffusely ?esophagitis (bx benign), small HH/antral erosions, erythema bx benign. 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 ESOPHAGOGASTRODUODENOSCOPY (EGD) WITH ESOPHAGEAL DILATION N/A 03/07/2012   [id number], patent, tubular esophagus of uncertain significance-status post biopsy )unremarkable). Hiatal hernia  . LAPAROSCOPIC APPENDECTOMY  07/16/2011   Procedure: APPENDECTOMY LAPAROSCOPIC;  Surgeon: Donato Heinz, MD;  Location: [facility];  Service: General;  Laterality: N/A;  . TUBAL LIGATION      Prior to Admission medications   Medication Sig Start Date End Date Taking? Authorizing Provider  Acetaminophen (TYLENOL ARTHRITIS PAIN PO) Take 2 tablets by mouth 3 (three) times daily.   Yes Historical Provider, MD  acetaminophen (TYLENOL) 500 MG tablet Take 1,000 mg by mouth 2 (two) times daily. One tablet three times daily 05/19/15  Yes [name], MD  albuterol (PROVENTIL HFA;VENTOLIN HFA) 108 (90 BASE) MCG/ACT inhaler Inhale 2 puffs into the lungs every 6 (six) hours as needed. Shortness of breath 08/02/12  Yes [name], MD  amLODipine (NORVASC) 5 MG tablet Take 1 tablet (5 mg total) by mouth daily. 10/27/15  Yes [name], MD  busPIRone (BUSPAR) 7.5 MG tablet TAKE 1 TABLET (7.5 MG TOTAL) BY MOUTH 3 (THREE) TIMES DAILY. 09/04/15  Yes [name], MD  cholestyramine Lucrezia Starch) 4 GM/DOSE powder Take 4 g by mouth daily. 11/30/15  Yes Historical Provider, MD  DULoxetine (CYMBALTA) 60 MG capsule Take 1 capsule (60 mg total) by mouth daily. 05/19/15  Yes [name], MD  gabapentin (NEURONTIN) 600 MG tablet Take 1 tablet (600 mg total) by mouth 2 (two) times daily. 05/19/15  Yes [name], MD  levothyroxine (SYNTHROID, LEVOTHROID) 75 MCG tablet Take 1 tablet (75 mcg total) by mouth daily. 02/03/15  Yes [name], MD  montelukast (SINGULAIR) 10 MG tablet Take 1 tablet (10 mg total) by mouth at bedtime. 10/27/15  Yes [name], MD  Multiple Vitamins-Calcium (ONE-A-DAY WOMENS PO) Take 1 tablet by mouth daily.   Yes Historical Provider, MD  pantoprazole (PROTONIX) 40 MG tablet TAKE 1 TABLET EVERY DAY 11/05/15  Yes Annitta Needs, NP  pravastatin  (PRAVACHOL) 40 MG tablet Take 1 tablet (40 mg total) by mouth daily. 10/27/15  Yes [name], MD  temazepam (RESTORIL) 30 MG capsule Take 1 capsule (30 mg total) by mouth at bedtime. 10/27/15  Yes [name], MD  dicyclomine (BENTYL) 10 MG capsule Take 1 capsule (10 mg total) by mouth 3 (three) times daily with meals as needed for spasms. Patient not taking: Reported on 12/22/2015 10/27/15   [name], MD    Allergies as of 12/22/2015 - Review Complete 12/22/2015  Allergen Reaction Noted  . Aspirin Other (See Comments) 05/19/2015    Family History  Problem Relation Age of Onset  . Stroke Father   . Aneurysm Father   . Coronary artery disease Brother   . Hypertension Brother   . Down syndrome Brother   . Hypertension Brother   . Hypertension Brother     father/mother  . Anesthesia problems Neg Hx   . Hypotension Neg Hx   . Malignant hyperthermia Neg Hx   . Pseudochol deficiency Neg Hx   . Colon cancer Neg Hx   . Aneurysm Mother   . Throat cancer Brother 49    throat cancer  . Hypertension Sister   . Diabetes Brother   . Diabetes Brother     Social History   Social History  . Marital status: Married    Spouse name: N/A  . Number of children: 2  . Years of education: N/A   Occupational History  . disabled Not Employed   Social History Main Topics  . Smoking status: Current Every Day Smoker    Packs/day: 1.50    Years: 40.00    Types: Cigarettes    Start date: 07/14/1971  . Smokeless tobacco: Never Used     Comment: 0.5 PPD current plans to reduce   . Alcohol use No     Comment: quit June 2012-used to drink 6 pack daily  . Drug use: No  . Sexual activity: Yes    Birth control/ protection: Post-menopausal   Other Topics Concern  . Not on file   Social History Narrative  . No narrative on file    Review of Systems: See HPI, otherwise negative ROS  Physical Exam: BP 126/80   Pulse 96   Temp 98.1 F (36.7 C) (Oral)   Ht '5\' 8"'$   (1.727 m)   Wt 155 lb 3.2 oz (70.4 kg)   BMI 23.60 kg/m  General:   Alert,  Well-developed, well-nourished, pleasant and cooperative in NAD Abdomen: Nondistended. Positive bowel sounds. Very minimal diffuse tenderness to palpation appreciable mass or organomegaly.  Pulses:  Normal pulses noted. Extremities:  Without clubbing or edema.  Impression:   60 year old lady with IBS-D and fecal incontinence responsive to Questran. I think she is taking a little too much at 4 g daily. She may well do better dropping back to the half a scoop daily. Coincidental diarrhea when starting therapy may not have anything to do with the her prior dose of Questran.  GERD symptoms, on balance, well-controlled on Protonix 40 mg daily occasional breakthrough requiring a second dose. No alarm symptoms.  History of colonic adenoma-due for surveillance 2021.   Recommendations:  Continue Pantoprazole 40 mg daily; may take a second dose occasionally as needed  Use Questran -  1/2 scoop daily - not to be taken within 2 hours of other medications.  Stop taking Bentyl  Colonoscopy in 6 months  Office visit with [place] in 6 months    Notice: This dictation was prepared with Dragon dictation along with smaller phrase technology. Any transcriptional errors that result from this process are unintentional and may not be corrected upon review.

## 2016-01-07 ENCOUNTER — Telehealth: Payer: Self-pay

## 2016-01-07 ENCOUNTER — Other Ambulatory Visit: Payer: Self-pay | Admitting: Family Medicine

## 2016-01-07 MED ORDER — PREDNISONE 5 MG (21) PO TBPK: 5.0000 mg | ORAL_TABLET | ORAL | 0 refills | Status: DC

## 2016-01-07 MED ORDER — CYCLOBENZAPRINE HCL 10 MG PO TABS: 10.0000 mg | ORAL_TABLET | Freq: Every day | ORAL | 0 refills | Status: DC

## 2016-01-07 NOTE — Telephone Encounter (Signed)
Prednisone dose pack and bedtime fkxeril sent to [facility], dose pack also sent to [facility] pls send d/c order to [place], thanks

## 2016-01-07 NOTE — Telephone Encounter (Signed)
Patient aware.   Note sent to [place].

## 2016-01-07 NOTE — Progress Notes (Signed)
Red   

## 2016-01-28 ENCOUNTER — Ambulatory Visit (INDEPENDENT_AMBULATORY_CARE_PROVIDER_SITE_OTHER): Payer: Medicare HMO | Admitting: Family Medicine

## 2016-01-28 ENCOUNTER — Encounter: Payer: Self-pay | Admitting: Family Medicine

## 2016-01-28 VITALS — BP 116/78 | HR 108 | Temp 97.5°F | Resp 20 | Ht 68.0 in | Wt 153.1 lb

## 2016-01-28 DIAGNOSIS — J441 Chronic obstructive pulmonary disease with (acute) exacerbation: Secondary | ICD-10-CM

## 2016-01-28 DIAGNOSIS — F17218 Nicotine dependence, cigarettes, with other nicotine-induced disorders: Secondary | ICD-10-CM

## 2016-01-28 DIAGNOSIS — J069 Acute upper respiratory infection, unspecified: Secondary | ICD-10-CM | POA: Diagnosis not present

## 2016-01-28 MED ORDER — DOXYCYCLINE HYCLATE 100 MG PO CAPS: 100.0000 mg | ORAL_CAPSULE | Freq: Two times a day (BID) | ORAL | 0 refills | Status: DC

## 2016-01-28 MED ORDER — PREDNISONE 20 MG PO TABS: 20.0000 mg | ORAL_TABLET | Freq: Every day | ORAL | 0 refills | Status: DC

## 2016-01-28 NOTE — Progress Notes (Signed)
Chief Complaint  Patient presents with  . URI    since sunday   Primary care physician is Tula Nakayama M.D. Patient is here for an acute visit She states that she hasn't felt well since Thanksgiving. She has felt more tired, and slightly more short of breath. Since Sunday she has had symptoms of a respiratory infection with sinus congestion, rhinorrhea, nosebleeds, sinus pain and headache. She has a hoarse voice. She is coughing, increased sputum. She's having sweats and chills. Body aches and fatigue. Patient has underlying emphysema and chronic lung disease. She continues to smoke cigarettes. Her husband is ill as well.  Patient Active Problem List   Diagnosis Date Noted  . Ankle pain, left 10/27/2015  . Pollen allergies 05/19/2015  . Annual physical exam 11/11/2013  . Nicotine dependence 09/17/2013  . Cervical pain (neck) 06/21/2013  . Back pain with radiation 06/21/2013  . IDA (iron deficiency anemia) 08/10/2012  . Leg cramps 04/25/2012  . Essential hypertension 02/22/2012  . Cardiorenal syndrome 02/02/2012  . Insomnia 05/09/2011  . GERD (gastroesophageal reflux disease)   . Scleroderma ([place])   . Chronic lung disease   . Hypothyroid   . Arteriosclerotic cardiovascular disease (ASCVD)   . Syncope   . Anxiety and depression 06/24/2009  . Chronic back pain 06/24/2009  . Vitamin D deficiency 03/18/2009  . Hyperlipidemia 03/18/2009    Outpatient Encounter Prescriptions as of 01/28/2016  Medication Sig  . Acetaminophen (TYLENOL ARTHRITIS PAIN PO) Take 2 tablets by mouth 3 (three) times daily.  Marland Kitchen acetaminophen (TYLENOL) 500 MG tablet Take 1,000 mg by mouth 2 (two) times daily. One tablet three times daily  . albuterol (PROVENTIL HFA;VENTOLIN HFA) 108 (90 BASE) MCG/ACT inhaler Inhale 2 puffs into the lungs every 6 (six) hours as needed. Shortness of breath  . amLODipine (NORVASC) 5 MG tablet Take 1 tablet (5 mg total) by mouth daily.  . busPIRone (BUSPAR) 7.5 MG tablet  TAKE 1 TABLET (7.5 MG TOTAL) BY MOUTH 3 (THREE) TIMES DAILY.  . cholestyramine (QUESTRAN) 4 GM/DOSE powder Take 4 g by mouth daily.  . cyclobenzaprine (FLEXERIL) 10 MG tablet Take 1 tablet (10 mg total) by mouth at bedtime.  . dicyclomine (BENTYL) 10 MG capsule Take 1 capsule (10 mg total) by mouth 3 (three) times daily with meals as needed for spasms.  . DULoxetine (CYMBALTA) 60 MG capsule Take 1 capsule (60 mg total) by mouth daily.  Marland Kitchen gabapentin (NEURONTIN) 600 MG tablet Take 1 tablet (600 mg total) by mouth 2 (two) times daily.  Marland Kitchen levothyroxine (SYNTHROID, LEVOTHROID) 75 MCG tablet Take 1 tablet (75 mcg total) by mouth daily.  . montelukast (SINGULAIR) 10 MG tablet Take 1 tablet (10 mg total) by mouth at bedtime.  . Multiple Vitamins-Calcium (ONE-A-DAY WOMENS PO) Take 1 tablet by mouth daily.  . pantoprazole (PROTONIX) 40 MG tablet Take 1 tablet (40 mg total) by mouth 2 (two) times daily.  . pravastatin (PRAVACHOL) 40 MG tablet Take 1 tablet (40 mg total) by mouth daily.  . temazepam (RESTORIL) 30 MG capsule Take 1 capsule (30 mg total) by mouth at bedtime.  Marland Kitchen doxycycline (VIBRAMYCIN) 100 MG capsule Take 1 capsule (100 mg total) by mouth 2 (two) times daily.  . predniSONE (DELTASONE) 20 MG tablet Take 1 tablet (20 mg total) by mouth daily with breakfast.   No facility-administered encounter medications on file as of 01/28/2016.     Allergies  Allergen Reactions  . Aspirin Other (See Comments)  Nose bleeds    Review of Systems  Constitutional: Positive for activity change, appetite change, chills, fatigue and fever.  HENT: Positive for congestion, nosebleeds, postnasal drip, rhinorrhea, sinus pain, sinus pressure and voice change.   Eyes: Negative for redness and visual disturbance.  Respiratory: Positive for cough, shortness of breath and wheezing.   Cardiovascular: Negative for chest pain and palpitations.  Gastrointestinal: Positive for nausea. Negative for vomiting.    Genitourinary: Negative for difficulty urinating and frequency.  Musculoskeletal: Positive for myalgias. Negative for arthralgias.  Neurological: Positive for dizziness and headaches.  Psychiatric/Behavioral: Negative.     BP 116/78 (BP Location: Left Arm, Patient Position: Sitting, Cuff Size: Normal)   Pulse (!) 108   Temp 97.5 F (36.4 C) (Oral)   Resp 20   Ht '5\' 8"'$  (1.727 m)   Wt 153 lb 1.9 oz (69.5 kg)   SpO2 97%   BMI 23.28 kg/m   Physical Exam  Constitutional: She appears well-developed and well-nourished.  Appears fatigued  HENT:  Head: Normocephalic and atraumatic.  Right Ear: External ear normal.  Left Ear: External ear normal.  Dentition in very poor repair with fractures caries and periodontal disease. Posterior pharynx mildly injected. Nasal membranes are swollen and erythematous. Yellow discharge.  Eyes: Conjunctivae are normal. Pupils are equal, round, and reactive to light.  Neck: Normal range of motion.  Bilateral anterior cervical nodes  Cardiovascular: Normal rate, regular rhythm and normal heart sounds.   Pulmonary/Chest: Effort normal. No respiratory distress. She has wheezes. She has no rales.  Decreased diaphragmatic excursion, scattered inspiratory wheeze  Abdominal: Soft. Bowel sounds are normal.  Musculoskeletal: Normal range of motion. She exhibits no edema.  Lymphadenopathy:    She has cervical adenopathy.  Neurological: She is alert.  Skin: Skin is warm. She is diaphoretic.  Skin is damp  Psychiatric: She has a normal mood and affect. Her behavior is normal.    ASSESSMENT/PLAN:  1. Cigarette nicotine dependence with other nicotine-induced disorder  2. COPD exacerbation ([place])  3. Acute upper respiratory infection   Patient Instructions  Drink plenty of water Take the antibiotic as instructed Take the prednisone for 5 days May use cough medicine as needed Call if not better by next week    Raylene Everts, MD

## 2016-01-28 NOTE — Patient Instructions (Signed)
Drink plenty of water Take the antibiotic as instructed Take the prednisone for 5 days May use cough medicine as needed Call if not better by next week

## 2016-02-22 ENCOUNTER — Other Ambulatory Visit: Payer: Self-pay | Admitting: Family Medicine

## 2016-02-22 DIAGNOSIS — F419 Anxiety disorder, unspecified: Principal | ICD-10-CM

## 2016-02-22 DIAGNOSIS — F32A Depression, unspecified: Secondary | ICD-10-CM

## 2016-02-22 DIAGNOSIS — F329 Major depressive disorder, single episode, unspecified: Secondary | ICD-10-CM

## 2016-02-25 ENCOUNTER — Other Ambulatory Visit: Payer: Self-pay | Admitting: Gastroenterology

## 2016-02-25 ENCOUNTER — Other Ambulatory Visit: Payer: Self-pay | Admitting: Internal Medicine

## 2016-03-01 ENCOUNTER — Ambulatory Visit (INDEPENDENT_AMBULATORY_CARE_PROVIDER_SITE_OTHER): Payer: Medicare HMO

## 2016-03-01 VITALS — BP 122/76 | HR 96 | Temp 98.0°F | Ht 68.0 in | Wt 158.1 lb

## 2016-03-01 DIAGNOSIS — Z0000 Encounter for general adult medical examination without abnormal findings: Secondary | ICD-10-CM

## 2016-03-01 DIAGNOSIS — Z720 Tobacco use: Secondary | ICD-10-CM | POA: Diagnosis not present

## 2016-03-01 NOTE — Progress Notes (Signed)
Subjective:   Kelly Douglas is a 61 y.o. female who presents for Medicare Annual (Subsequent) previntive examination.  Review of Systems:  Cardiac Risk Factors include: dyslipidemia;hypertension;smoking/ tobacco exposure     Objective:     Vitals: BP 122/76   Pulse 96   Temp 98 F (36.7 C) (Oral)   Ht '5\' 8"'$  (1.727 m)   Wt 158 lb 1.9 oz (71.7 kg)   SpO2 97%   BMI 24.04 kg/m   Body mass index is 24.04 kg/m.   Tobacco History  Smoking Status  . Current Every Day Smoker  . Packs/day: 0.50  . Years: 40.00  . Types: Cigarettes  . Start date: 07/14/1971  Smokeless Tobacco  . Never Used    Comment: 0.5 PPD current plans to reduce      Ready to quit: Yes Counseling given: Yes   Past Medical History:  Diagnosis Date  . Anxiety   . Arteriosclerotic cardiovascular disease (ASCVD) 2013   coronary calcification-left main, LAD and CX; small pericardial effusion  . Arthritis   . Chronic back pain   . Chronic bronchitis   . Chronic lung disease    Chronic scarring and volume loss-left lung; characteristics of a chronic infectious process-possible MAI  . Depression   . GERD (gastroesophageal reflux disease)    Dr Gala Romney EGD 09/2009->esophagitis, sm HH, antral erosions, atonic esophagus  . Hyperlipidemia   . Hypothyroid 1981 approx  . Scleroderma ([place])   . Seasonal allergies   . Syncope    Multiple spells over the past 40+ years, likely neurocardiogenic  . Tobacco abuse 06/24/2009   Past Surgical History:  Procedure Laterality Date  . BILATERAL SALPINGOOPHORECTOMY  2013    Dr. Elonda Husky; uterus remains in situ  . COLONOSCOPY  09/28/09   anal papilla otherwise normal  . COLONOSCOPY N/A 08/29/2012   [id number] polyp-removed as described above. tubular adenoma  . ESOPHAGOGASTRODUODENOSCOPY  10/07/09   Dr. Barrie Dunker of esophageal mucosa, diffusely ?esophagitis (bx benign), small HH/antral erosions, erythema bx benign. 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  ESOPHAGOGASTRODUODENOSCOPY (EGD) WITH ESOPHAGEAL DILATION N/A 03/07/2012   [id number], patent, tubular esophagus of uncertain significance-status post biopsy )unremarkable). Hiatal hernia  . LAPAROSCOPIC APPENDECTOMY  07/16/2011   Procedure: APPENDECTOMY LAPAROSCOPIC;  Surgeon: Donato Heinz, MD;  Location: [facility];  Service: General;  Laterality: N/A;  . TUBAL LIGATION     Family History  Problem Relation Age of Onset  . Stroke Father   . Aneurysm Father   . Coronary artery disease Brother   . Hypertension Brother   . Down syndrome Brother   . Hypertension Brother   . Hypertension Brother     father/mother  . Aneurysm Mother   . Throat cancer Brother 49    throat cancer  . Hypertension Sister   . Diabetes Brother   . Diabetes Brother   . Anesthesia problems Neg Hx   . Hypotension Neg Hx   . Malignant hyperthermia Neg Hx   . Pseudochol deficiency Neg Hx   . Colon cancer Neg Hx    History  Sexual Activity  . Sexual activity: Yes  . Birth control/ protection: Post-menopausal    Outpatient Encounter Prescriptions as of 03/01/2016  Medication Sig  . Acetaminophen (TYLENOL ARTHRITIS PAIN PO) Take 2 tablets by mouth 3 (three) times daily.  Marland Kitchen albuterol (PROVENTIL HFA;VENTOLIN HFA) 108 (90 BASE) MCG/ACT inhaler Inhale 2 puffs into the lungs every 6 (six) hours as needed. Shortness of breath  . amLODipine (NORVASC)  5 MG tablet Take 1 tablet (5 mg total) by mouth daily.  . busPIRone (BUSPAR) 7.5 MG tablet TAKE 1 TABLET (7.5 MG) THREE TIMES DAILY.  . cholestyramine (QUESTRAN) 4 g packet MIX 1/2 PACKET AND DRINK DAILY. MAY INCREASE TO 1 PACKET DAILY. DO NOT TAKE WITHIN 2 HOURS OF OTHER MEDICATIONS  . DULoxetine (CYMBALTA) 60 MG capsule Take 1 capsule (60 mg total) by mouth daily.  Marland Kitchen gabapentin (NEURONTIN) 600 MG tablet Take 1 tablet (600 mg total) by mouth 2 (two) times daily.  Marland Kitchen levothyroxine (SYNTHROID, LEVOTHROID) 75 MCG tablet Take 1 tablet (75 mcg total) by mouth daily.  .  montelukast (SINGULAIR) 10 MG tablet Take 1 tablet (10 mg total) by mouth at bedtime.  . Multiple Vitamins-Calcium (ONE-A-DAY WOMENS PO) Take 1 tablet by mouth daily.  . pantoprazole (PROTONIX) 40 MG tablet Take 1 tablet (40 mg total) by mouth 2 (two) times daily.  . pravastatin (PRAVACHOL) 40 MG tablet Take 1 tablet (40 mg total) by mouth daily.  . temazepam (RESTORIL) 30 MG capsule Take 1 capsule (30 mg total) by mouth at bedtime.  . [DISCONTINUED] acetaminophen (TYLENOL) 500 MG tablet Take 1,000 mg by mouth 2 (two) times daily. One tablet three times daily  . [DISCONTINUED] cholestyramine (QUESTRAN) 4 GM/DOSE powder Take 4 g by mouth daily.  . [DISCONTINUED] cyclobenzaprine (FLEXERIL) 10 MG tablet Take 1 tablet (10 mg total) by mouth at bedtime.  . [DISCONTINUED] dicyclomine (BENTYL) 10 MG capsule Take 1 capsule (10 mg total) by mouth 3 (three) times daily with meals as needed for spasms.  . [DISCONTINUED] doxycycline (VIBRAMYCIN) 100 MG capsule Take 1 capsule (100 mg total) by mouth 2 (two) times daily.  . [DISCONTINUED] predniSONE (DELTASONE) 20 MG tablet Take 1 tablet (20 mg total) by mouth daily with breakfast.   No facility-administered encounter medications on file as of 03/01/2016.     Activities of Daily Living In your present state of health, do you have any difficulty performing the following activities: 03/01/2016 01/28/2016  Hearing? N N  Vision? Y N  Difficulty concentrating or making decisions? N N  Walking or climbing stairs? Y N  Dressing or bathing? N N  Doing errands, shopping? Y N  Preparing Food and eating ? N -  Using the Toilet? N -  In the past six months, have you accidently leaked urine? N -  Do you have problems with loss of bowel control? N -  Managing your Medications? N -  Managing your Finances? N -  Housekeeping or managing your Housekeeping? Y -  Some recent data might be hidden    Patient Care Team: [name], MD as PCP - General Yehuda Savannah, MD (Cardiology) Daneil Dolin, MD (Gastroenterology) Teena Irani, PTA as Physical Therapy Assistant (Physical Therapy) Leta Baptist, MD as Consulting Physician (Otolaryngology)    Assessment:    Exercise Activities and Dietary recommendations Current Exercise Habits: Home exercise routine, Type of exercise: stretching, Time (Minutes): 20, Frequency (Times/Week): 5, Weekly Exercise (Minutes/Week): 100, Intensity: Mild  Goals    . Quit smoking / using tobacco          Patient would like to quit smoking. Recommend smoking cessation classes.      Fall Risk Fall Risk  03/01/2016 01/28/2016 05/19/2015 09/03/2014 04/23/2014  Falls in the past year? No No No No Yes  Number falls in past yr: - - - - 1  Injury with Fall? - - - - Yes  Risk  Factor Category  - - - - High Fall Risk  Risk for fall due to : - - Impaired balance/gait - -   Depression Screen PHQ 2/9 Scores 03/01/2016 01/28/2016 05/19/2015 02/03/2015  PHQ - 2 Score 0 0 0 3  PHQ- 9 Score - - - 7     Cognitive Function  Normal   6CIT Screen 03/01/2016  What Year? 0 points  What month? 0 points  What time? 0 points  Count back from 20 0 points  Months in reverse 0 points  Repeat phrase 0 points  Total Score 0    Immunization History  Administered Date(s) Administered  . Influenza Split 10/12/2011  . Influenza Whole 10/13/2009  . Influenza,inj,Quad PF,36+ Mos 11/01/2012, 09/16/2013, 11/20/2014, 10/27/2015  . Pneumococcal Conjugate-13 12/20/2013  . Td 06/24/2009  . Tdap 11/17/2010   Screening Tests Health Maintenance  Topic Date Due  . ZOSTAVAX  12/01/2016 (Originally 07/14/2015)  . PAP SMEAR  04/03/2016  . MAMMOGRAM  04/15/2017  . TETANUS/TDAP  11/16/2020  . COLONOSCOPY  08/30/2022  . INFLUENZA VACCINE  Completed  . Hepatitis C Screening  Completed  . HIV Screening  Addressed      Plan:  I have personally reviewed and addressed the Medicare Annual Wellness questionnaire and have noted the following in the  patient's chart:  A. Medical and social history B. Use of alcohol, tobacco or illicit drugs  C. Current medications and supplements D. Functional ability and status E.  Nutritional status F.  Physical activity G. Advance directives discussed today H. List of other physicians I.  Hospitalizations, surgeries, and ER visits in previous 12 months J.  [place] to include cognitive, depression, and falls L. Referrals and appointments - [employer] referral sent today for assistance with food, transportation assistance, and a grab bar for your bath tub.  In addition, I have reviewed and discussed with patient certain preventive protocols, quality metrics, and best practice recommendations. A written personalized care plan for preventive services as well as general preventive health recommendations were provided to patient.  Signed,   Stormy Fabian, LPN Lead Nurse Health Advisor

## 2016-03-01 NOTE — Patient Instructions (Addendum)
Health maintenance: Up to date   Abnormal screenings: none   Patient concerns: Pain in hips, recommend follow up with Dr. Moshe Cipro. Needs assistance with transportation, food, and getting out of the bathtub. Community resource referral was sent today for our [place] to assist you with these things. You should receive a call from her in about 2 weeks.   Nurse concerns: Quit smoking   Next PCP appt: 03/02/2016 at 8:00 am. Please follow up in 1 year for your annual wellness exam.   Advance directive discussed with patient today. Copy provided for patient to complete at home and have notarized. Patient agrees to have copy sent to our office once it is complete.  Preventive Care 40-64 Years, Female Preventive care refers to lifestyle choices and visits with your health care provider that can promote health and wellness. What does preventive care include?  A yearly physical exam. This is also called an annual well check.  Dental exams once or twice a year.  Routine eye exams. Ask your health care provider how often you should have your eyes checked.  Personal lifestyle choices, including:  Daily care of your teeth and gums.  Regular physical activity.  Eating a healthy diet.  Avoiding tobacco and drug use.  Limiting alcohol use.  Practicing safe sex.  Taking low-dose aspirin daily starting at age 28.  Taking vitamin and mineral supplements as recommended by your health care provider. What happens during an annual well check? The services and screenings done by your health care provider during your annual well check will depend on your age, overall health, lifestyle risk factors, and family history of disease. Counseling  Your health care provider may ask you questions about your:  Alcohol use.  Tobacco use.  Drug use.  Emotional well-being.  Home and relationship well-being.  Sexual activity.  Eating habits.  Work and work Statistician.  Method of birth  control.  Menstrual cycle.  Pregnancy history. Screening  You may have the following tests or measurements:  Height, weight, and BMI.  Blood pressure.  Lipid and cholesterol levels. These may be checked every 5 years, or more frequently if you are over 5 years old.  Skin check.  Lung cancer screening. You may have this screening every year starting at age 19 if you have a 30-pack-year history of smoking and currently smoke or have quit within the past 15 years.  Fecal occult blood test (FOBT) of the stool. You may have this test every year starting at age 20.  Flexible sigmoidoscopy or colonoscopy. You may have a sigmoidoscopy every 5 years or a colonoscopy every 10 years starting at age 28.  Hepatitis C blood test.  Diabetes screening. This is done by checking your blood sugar (glucose) after you have not eaten for a while (fasting). You may have this done every 1-3 years.  Mammogram. This may be done every 1-2 years. Talk to your health care provider about when you should start having regular mammograms. This may depend on whether you have a family history of breast cancer.  Pelvic exam and Pap test. This may be done every 3 years starting at age 56. Starting at age 8, this may be done every 5 years if you have a Pap test in combination with an HPV test.  Bone density scan. This is done to screen for osteoporosis. You may have this scan if you are at high risk for osteoporosis. Discuss your test results, treatment options, and if necessary, the need  for more tests with your health care provider. Vaccines  Your health care provider may recommend certain vaccines, such as:  Influenza vaccine. This is recommended every year.  Tetanus, diphtheria, and acellular pertussis (Tdap, Td) vaccine. You may need a Td booster every 10 years.  Zoster vaccine. You may need this after age 54.  Pneumococcal 13-valent conjugate (PCV13) vaccine. You may need this if you have certain  conditions and were not previously vaccinated.  Pneumococcal polysaccharide (PPSV23) vaccine. You may need one or two doses if you smoke cigarettes or if you have certain conditions.  Talk to your health care provider about which screenings and vaccines you need and how often you need them. This information is not intended to replace advice given to you by your health care provider. Make sure you discuss any questions you have with your health care provider. Document Released: 01/30/2015 Document Revised: 09/23/2015 Document Reviewed: 11/04/2014 Elsevier Interactive Patient Education  2017 [employer].      Steps to Quit Smoking Smoking tobacco can be bad for your health. It can also affect almost every organ in your body. Smoking puts you and people around you at risk for many serious long-lasting (chronic) diseases. Quitting smoking is hard, but it is one of the best things that you can do for your health. It is never too late to quit. What are the benefits of quitting smoking? When you quit smoking, you lower your risk for getting serious diseases and conditions. They can include:  Lung cancer or lung disease.  Heart disease.  Stroke.  Heart attack.  Not being able to have children (infertility).  Weak bones (osteoporosis) and broken bones (fractures). If you have coughing, wheezing, and shortness of breath, those symptoms may get better when you quit. You may also get sick less often. If you are pregnant, quitting smoking can help to lower your chances of having a baby of low birth weight. What can I do to help me quit smoking? Talk with your doctor about what can help you quit smoking. Some things you can do (strategies) include:  Quitting smoking totally, instead of slowly cutting back how much you smoke over a period of time.  Going to in-person counseling. You are more likely to quit if you go to many counseling sessions.  Using resources and support systems, such  as:  Online chats with a Social worker.  Phone quitlines.  Printed Furniture conservator/restorer.  Support groups or group counseling.  Text messaging programs.  Mobile phone apps or applications.  Taking medicines. Some of these medicines may have nicotine in them. If you are pregnant or breastfeeding, do not take any medicines to quit smoking unless your doctor says it is okay. Talk with your doctor about counseling or other things that can help you. Talk with your doctor about using more than one strategy at the same time, such as taking medicines while you are also going to in-person counseling. This can help make quitting easier. What things can I do to make it easier to quit? Quitting smoking might feel very hard at first, but there is a lot that you can do to make it easier. Take these steps:  Talk to your family and friends. Ask them to support and encourage you.  Call phone quitlines, reach out to support groups, or work with a Social worker.  Ask people who smoke to not smoke around you.  Avoid places that make you want (trigger) to smoke, such as:  Bars.  Parties.  Smoke-break areas at work.  Spend time with people who do not smoke.  Lower the stress in your life. Stress can make you want to smoke. Try these things to help your stress:  Getting regular exercise.  Deep-breathing exercises.  Yoga.  Meditating.  Doing a body scan. To do this, close your eyes, focus on one area of your body at a time from head to toe, and notice which parts of your body are tense. Try to relax the muscles in those areas.  Download or buy apps on your mobile phone or tablet that can help you stick to your quit plan. There are many free apps, such as QuitGuide from the [employer] Office manager for Disease Control and Prevention). You can find more support from smokefree.gov and other websites. This information is not intended to replace advice given to you by your health care provider. Make sure you discuss  any questions you have with your health care provider. Document Released: 10/30/2008 Document Revised: 09/01/2015 Document Reviewed: 05/20/2014 Elsevier Interactive Patient Education  2017 [employer].

## 2016-03-02 ENCOUNTER — Ambulatory Visit (INDEPENDENT_AMBULATORY_CARE_PROVIDER_SITE_OTHER): Payer: Medicare HMO | Admitting: Family Medicine

## 2016-03-02 ENCOUNTER — Encounter: Payer: Self-pay | Admitting: Family Medicine

## 2016-03-02 VITALS — BP 130/78 | HR 91 | Resp 15 | Ht 68.0 in | Wt 158.0 lb

## 2016-03-02 DIAGNOSIS — F17219 Nicotine dependence, cigarettes, with unspecified nicotine-induced disorders: Secondary | ICD-10-CM | POA: Diagnosis not present

## 2016-03-02 DIAGNOSIS — F419 Anxiety disorder, unspecified: Secondary | ICD-10-CM

## 2016-03-02 DIAGNOSIS — J984 Other disorders of lung: Secondary | ICD-10-CM

## 2016-03-02 DIAGNOSIS — E038 Other specified hypothyroidism: Secondary | ICD-10-CM

## 2016-03-02 DIAGNOSIS — F5104 Psychophysiologic insomnia: Secondary | ICD-10-CM

## 2016-03-02 DIAGNOSIS — E559 Vitamin D deficiency, unspecified: Secondary | ICD-10-CM | POA: Diagnosis not present

## 2016-03-02 DIAGNOSIS — F329 Major depressive disorder, single episode, unspecified: Secondary | ICD-10-CM

## 2016-03-02 DIAGNOSIS — M549 Dorsalgia, unspecified: Secondary | ICD-10-CM

## 2016-03-02 DIAGNOSIS — E785 Hyperlipidemia, unspecified: Secondary | ICD-10-CM

## 2016-03-02 DIAGNOSIS — F418 Other specified anxiety disorders: Secondary | ICD-10-CM

## 2016-03-02 DIAGNOSIS — I10 Essential (primary) hypertension: Secondary | ICD-10-CM

## 2016-03-02 NOTE — Assessment & Plan Note (Signed)
Controlled, no change in medication DASH diet and commitment to daily physical activity for a minimum of 30 minutes discussed and encouraged, as a part of hypertension management. The importance of attaining a healthy weight is also discussed.  BP/Weight 03/02/2016 03/01/2016 01/28/2016 12/22/2015 10/27/2015 10/09/2015 3/32/9518  Systolic BP 841 660 630 160 109 323 557  Diastolic BP 78 76 78 80 70 72 82  Wt. (Lbs) 158 158.12 153.12 155.2 156 152.4 -  BMI 24.02 24.04 23.28 23.6 23.72 23.17 -

## 2016-03-02 NOTE — Assessment & Plan Note (Signed)
Needs to schedule f/u with Dr Luan Pulling and also needs re imaging of lungs which he will direct, she is aware

## 2016-03-02 NOTE — Progress Notes (Signed)
   Kelly Douglas     MRN: [id number]      DOB: Nov 19, 1955   HPI Ms. Klipfel is here for follow up and re-evaluation of chronic medical conditions, medication management and review of any available recent lab and radiology data.  Preventive health is updated, specifically  Cancer screening and Immunization.   C/o increased and uncontrolled low back pain radiating to legs, has benefitted in the past from epidural, will refer The PT denies any adverse reactions to current medications since the last visit.  C/o increased stress with death in the family, recently states though she wants to stop smoking cannot commit now   ROS Denies recent fever or chills. Denies sinus pressure, nasal congestion, ear pain or sore throat. Denies chest congestion, productive cough or wheezing. Denies chest pains, palpitations and leg swelling Denies abdominal pain, nausea, vomiting,diarrhea or constipation.   Denies dysuria, frequency, hesitancy or incontinence. Denies skin break down or rash.   PE  BP 130/78   Pulse 91   Resp 15   Ht '5\' 8"'$  (1.727 m)   Wt 158 lb (71.7 kg)   SpO2 95%   BMI 24.02 kg/m   Patient alert and oriented and in no cardiopulmonary distress.  HEENT: No facial asymmetry, EOMI,   oropharynx pink and moist.  Neck supple no JVD, no mass.  Chest: Clear to auscultation bilaterally.decreased air entry bilaterally  CVS: S1, S2 no murmurs, no S3.Regular rate.  ABD: Soft non tender.   Ext: No edema  MS: decreased ROM lumbar spine  Skin: Intact, no ulcerations or rash noted.  Psych: Good eye contact, normal affect. Memory intact not anxious or depressed appearing.  CNS: CN 2-12 intact, power,  normal throughout.no focal deficits noted.   Assessment & Plan  Nicotine dependence Patient counseled for approximately 5 minutes regarding the health risks of ongoing nicotine use, specifically all types of cancer, heart disease, stroke and respiratory failure. The options  available for help with cessation ,the behavioral changes to assist the process, and the option to either gradully reduce usage  Or abruptly stop.is also discussed. Pt is also encouraged to set specific goals in number of cigarettes used daily, as well as to set a quit date.     Back pain with radiation increased and uncontrolled refer for epidural  Essential hypertension Controlled, no change in medication DASH diet and commitment to daily physical activity for a minimum of 30 minutes discussed and encouraged, as a part of hypertension management. The importance of attaining a healthy weight is also discussed.  BP/Weight 03/02/2016 03/01/2016 01/28/2016 12/22/2015 10/27/2015 10/09/2015 2/67/1245  Systolic BP 809 983 382 505 397 673 419  Diastolic BP 78 76 78 80 70 72 82  Wt. (Lbs) 158 158.12 153.12 155.2 156 152.4 -  BMI 24.02 24.04 23.28 23.6 23.72 23.17 -       Chronic lung disease Needs to schedule f/u with Dr Luan Pulling and also needs re imaging of lungs which he will direct, she is aware  Hypothyroid Updated lab needed at/ before next visit.   Insomnia Sleep hygiene reviewed and written information offered also. Prescription sent for  medication needed.   Anxiety and depression No change in medication. Currently experiencing increased symptoms due to multiple illnesses in family,

## 2016-03-02 NOTE — Assessment & Plan Note (Signed)

## 2016-03-02 NOTE — Assessment & Plan Note (Signed)
No change in medication. Currently experiencing increased symptoms due to multiple illnesses in family,

## 2016-03-02 NOTE — Assessment & Plan Note (Signed)
increased and uncontrolled refer for epidural

## 2016-03-02 NOTE — Assessment & Plan Note (Signed)
Sleep hygiene reviewed and written information offered also. Prescription sent for  medication needed.  

## 2016-03-02 NOTE — Assessment & Plan Note (Signed)
Updated lab needed at/ before next visit.   

## 2016-03-02 NOTE — Patient Instructions (Addendum)
F/u in 4 month, call if you need me before  Please follow through on classes to stop smoking, and you do NEED yearly chest scans , Dr Luan Pulling is following you for this  Mammogram due March 31 or after, please schedule  You are referred to [facility] for epidural injection  Fasting lipid, cmp , TSH and vit D and CBC March 10 or shortly after  Check cost of shingles vaccine         Steps to Quit Smoking Smoking tobacco can be bad for your health. It can also affect almost every organ in your body. Smoking puts you and people around you at risk for many serious long-lasting (chronic) diseases. Quitting smoking is hard, but it is one of the best things that you can do for your health. It is never too late to quit. What are the benefits of quitting smoking? When you quit smoking, you lower your risk for getting serious diseases and conditions. They can include:  Lung cancer or lung disease.  Heart disease.  Stroke.  Heart attack.  Not being able to have children (infertility).  Weak bones (osteoporosis) and broken bones (fractures). If you have coughing, wheezing, and shortness of breath, those symptoms may get better when you quit. You may also get sick less often. If you are pregnant, quitting smoking can help to lower your chances of having a baby of low birth weight. What can I do to help me quit smoking? Talk with your doctor about what can help you quit smoking. Some things you can do (strategies) include:  Quitting smoking totally, instead of slowly cutting back how much you smoke over a period of time.  Going to in-person counseling. You are more likely to quit if you go to many counseling sessions.  Using resources and support systems, such as:  Online chats with a Social worker.  Phone quitlines.  Printed Furniture conservator/restorer.  Support groups or group counseling.  Text messaging programs.  Mobile phone apps or applications.  Taking medicines. Some of these  medicines may have nicotine in them. If you are pregnant or breastfeeding, do not take any medicines to quit smoking unless your doctor says it is okay. Talk with your doctor about counseling or other things that can help you. Talk with your doctor about using more than one strategy at the same time, such as taking medicines while you are also going to in-person counseling. This can help make quitting easier. What things can I do to make it easier to quit? Quitting smoking might feel very hard at first, but there is a lot that you can do to make it easier. Take these steps:  Talk to your family and friends. Ask them to support and encourage you.  Call phone quitlines, reach out to support groups, or work with a Social worker.  Ask people who smoke to not smoke around you.  Avoid places that make you want (trigger) to smoke, such as:  Bars.  Parties.  Smoke-break areas at work.  Spend time with people who do not smoke.  Lower the stress in your life. Stress can make you want to smoke. Try these things to help your stress:  Getting regular exercise.  Deep-breathing exercises.  Yoga.  Meditating.  Doing a body scan. To do this, close your eyes, focus on one area of your body at a time from head to toe, and notice which parts of your body are tense. Try to relax the muscles in those areas.  Download or buy apps on your mobile phone or tablet that can help you stick to your quit plan. There are many free apps, such as QuitGuide from the [employer] Office manager for Disease Control and Prevention). You can find more support from smokefree.gov and other websites. This information is not intended to replace advice given to you by your health care provider. Make sure you discuss any questions you have with your health care provider. Document Released: 10/30/2008 Document Revised: 09/01/2015 Document Reviewed: 05/20/2014 Elsevier Interactive Patient Education  2017 [employer].

## 2016-03-08 ENCOUNTER — Ambulatory Visit: Payer: Commercial Managed Care - HMO | Admitting: Family Medicine

## 2016-03-15 ENCOUNTER — Other Ambulatory Visit: Payer: Self-pay

## 2016-03-15 MED ORDER — CHOLESTYRAMINE 4 G PO PACK: PACK | ORAL | 3 refills | Status: DC

## 2016-03-23 ENCOUNTER — Encounter: Payer: Self-pay | Admitting: Family Medicine

## 2016-03-26 ENCOUNTER — Other Ambulatory Visit: Payer: Self-pay | Admitting: Family Medicine

## 2016-04-01 DIAGNOSIS — I10 Essential (primary) hypertension: Secondary | ICD-10-CM | POA: Diagnosis not present

## 2016-04-01 DIAGNOSIS — M349 Systemic sclerosis, unspecified: Secondary | ICD-10-CM | POA: Diagnosis not present

## 2016-04-01 DIAGNOSIS — J449 Chronic obstructive pulmonary disease, unspecified: Secondary | ICD-10-CM | POA: Diagnosis not present

## 2016-04-01 DIAGNOSIS — I70209 Unspecified atherosclerosis of native arteries of extremities, unspecified extremity: Secondary | ICD-10-CM | POA: Diagnosis not present

## 2016-04-19 ENCOUNTER — Other Ambulatory Visit: Payer: Self-pay | Admitting: Family Medicine

## 2016-04-19 DIAGNOSIS — Z9109 Other allergy status, other than to drugs and biological substances: Secondary | ICD-10-CM

## 2016-04-19 DIAGNOSIS — M549 Dorsalgia, unspecified: Secondary | ICD-10-CM

## 2016-05-09 ENCOUNTER — Other Ambulatory Visit: Payer: Self-pay | Admitting: Internal Medicine

## 2016-05-11 ENCOUNTER — Other Ambulatory Visit: Payer: Self-pay | Admitting: Family Medicine

## 2016-05-24 ENCOUNTER — Encounter: Payer: Self-pay | Admitting: Internal Medicine

## 2016-06-22 DIAGNOSIS — E785 Hyperlipidemia, unspecified: Secondary | ICD-10-CM | POA: Diagnosis not present

## 2016-06-22 DIAGNOSIS — E559 Vitamin D deficiency, unspecified: Secondary | ICD-10-CM | POA: Diagnosis not present

## 2016-06-22 DIAGNOSIS — E038 Other specified hypothyroidism: Secondary | ICD-10-CM | POA: Diagnosis not present

## 2016-06-22 DIAGNOSIS — I10 Essential (primary) hypertension: Secondary | ICD-10-CM | POA: Diagnosis not present

## 2016-06-22 LAB — LIPID PANEL
CHOLESTEROL: 115 mg/dL (ref <200)
HDL: 34 mg/dL — ABNORMAL LOW (ref >50)
LDL Cholesterol: 56 mg/dL (ref <100)
TRIGLYCERIDES: 126 mg/dL (ref <150)
Total CHOL/HDL Ratio: 3.4 Ratio (ref <5.0)
VLDL: 25 mg/dL (ref <30)

## 2016-06-22 LAB — COMPREHENSIVE METABOLIC PANEL
ALBUMIN: 4.3 g/dL (ref 3.6–5.1)
ALK PHOS: 95 U/L (ref 33–130)
ALT: 9 U/L (ref 6–29)
AST: 12 U/L (ref 10–35)
BILIRUBIN TOTAL: 0.5 mg/dL (ref 0.2–1.2)
BUN: 8 mg/dL (ref 7–25)
CALCIUM: 9.5 mg/dL (ref 8.6–10.4)
CO2: 30 mmol/L (ref 20–31)
Chloride: 106 mmol/L (ref 98–110)
Creat: 0.9 mg/dL (ref 0.50–0.99)
Glucose, Bld: 91 mg/dL (ref 65–99)
Potassium: 3.3 mmol/L — ABNORMAL LOW (ref 3.5–5.3)
Sodium: 142 mmol/L (ref 135–146)
TOTAL PROTEIN: 7 g/dL (ref 6.1–8.1)

## 2016-06-22 LAB — CBC
HCT: 39 % (ref 35.0–45.0)
Hemoglobin: 12.7 g/dL (ref 11.7–15.5)
MCH: 25.6 pg — ABNORMAL LOW (ref 27.0–33.0)
MCHC: 32.6 g/dL (ref 32.0–36.0)
MCV: 78.5 fL — AB (ref 80.0–100.0)
MPV: 9.8 fL (ref 7.5–12.5)
PLATELETS: 294 10*3/uL (ref 140–400)
RBC: 4.97 MIL/uL (ref 3.80–5.10)
RDW: 15.2 % — ABNORMAL HIGH (ref 11.0–15.0)
WBC: 10.3 10*3/uL (ref 3.8–10.8)

## 2016-06-23 LAB — VITAMIN D 25 HYDROXY (VIT D DEFICIENCY, FRACTURES): VIT D 25 HYDROXY: 26 ng/mL — AB (ref 30–100)

## 2016-06-23 LAB — TSH: TSH: 1.96 mIU/L

## 2016-07-05 ENCOUNTER — Other Ambulatory Visit: Payer: Self-pay | Admitting: Family Medicine

## 2016-07-05 DIAGNOSIS — F329 Major depressive disorder, single episode, unspecified: Secondary | ICD-10-CM

## 2016-07-05 DIAGNOSIS — F419 Anxiety disorder, unspecified: Principal | ICD-10-CM

## 2016-07-12 ENCOUNTER — Ambulatory Visit (INDEPENDENT_AMBULATORY_CARE_PROVIDER_SITE_OTHER): Payer: Medicare HMO | Admitting: Family Medicine

## 2016-07-12 ENCOUNTER — Encounter: Payer: Self-pay | Admitting: Family Medicine

## 2016-07-12 VITALS — BP 124/72 | HR 87 | Resp 16 | Ht 68.0 in | Wt 157.0 lb

## 2016-07-12 DIAGNOSIS — E785 Hyperlipidemia, unspecified: Secondary | ICD-10-CM | POA: Diagnosis not present

## 2016-07-12 DIAGNOSIS — F329 Major depressive disorder, single episode, unspecified: Secondary | ICD-10-CM | POA: Diagnosis not present

## 2016-07-12 DIAGNOSIS — I10 Essential (primary) hypertension: Secondary | ICD-10-CM

## 2016-07-12 DIAGNOSIS — F419 Anxiety disorder, unspecified: Secondary | ICD-10-CM | POA: Diagnosis not present

## 2016-07-12 DIAGNOSIS — E559 Vitamin D deficiency, unspecified: Secondary | ICD-10-CM | POA: Diagnosis not present

## 2016-07-12 DIAGNOSIS — F17218 Nicotine dependence, cigarettes, with other nicotine-induced disorders: Secondary | ICD-10-CM

## 2016-07-12 DIAGNOSIS — E038 Other specified hypothyroidism: Secondary | ICD-10-CM

## 2016-07-12 DIAGNOSIS — M7552 Bursitis of left shoulder: Secondary | ICD-10-CM

## 2016-07-12 MED ORDER — BUSPIRONE HCL 10 MG PO TABS: 10.0000 mg | ORAL_TABLET | Freq: Three times a day (TID) | ORAL | 1 refills | Status: DC

## 2016-07-12 MED ORDER — BUPROPION HCL ER (SR) 150 MG PO TB12: ORAL_TABLET | ORAL | 0 refills | Status: DC

## 2016-07-12 NOTE — Patient Instructions (Signed)
Physical exam Oct 11 or after, call if  you need me sooner  Quit date for smoking is 2 weeks after starting Zyban, the best birthday present you give yourself!  Call [place] for support and call health  Dept for local group sessions  Labs are good  Start OTC Vit D3 2000 IU one daily  You are referred to Dr Aline Brochure  HAPPY 16th BIRTHDAY, many, many more!  Thank you  for choosing [facility]. We consider it a privelige to serve you.  Delivering excellent health care in a caring and  compassionate way is our goal.  Partnering with you,  so that together we can achieve this goal is our strategy.

## 2016-07-12 NOTE — Assessment & Plan Note (Signed)
Uncontrolled anxiety  Increase buspar dose

## 2016-07-12 NOTE — Assessment & Plan Note (Signed)
2 month history with limited movement , no trauma refer ortho, has had success with intra articular injection in the past

## 2016-07-12 NOTE — Progress Notes (Signed)
[name]     MRN: [id number]      DOB: [birth year]   HPI [name] is here for follow up and re-evaluation of chronic medical conditions, medication management and review of any available recent lab and radiology data.  Preventive health is updated, specifically  Cancer screening and Immunization.   Questions or concerns regarding consultations or procedures which the PT has had in the interim are  addressed. The PT denies any adverse reactions to current medications since the last visit.  2 month h/o left shoulder pain with reduced mobility. Left foot swelling intermittently at the end of the day x 1 month on average 2 to 3 times per week  ROS Denies recent fever or chills. Denies sinus pressure, nasal congestion, ear pain or sore throat. Denies chest congestion, productive cough or wheezing. Denies chest pains, palpitations Denies abdominal pain, nausea, vomiting,diarrhea or constipation.   Denies dysuria, frequency, hesitancy or incontinence. Denies headaches, seizures, numbness, or tingling. Denies depression,  C/o increased anxiety and  insomnia. Denies skin break down or rash.   PE  BP 138/80   Pulse 87   Resp 16   Ht 5\' 8"  (1.727 m)   Wt 157 lb (71.2 kg)   SpO2 97%   BMI 23.87 kg/m   Patient alert and oriented and in no cardiopulmonary distress.  HEENT: No facial asymmetry, EOMI,   oropharynx pink and moist.  Neck supple no JVD, no mass.  Chest: Clear to auscultation bilaterally.  CVS: S1, S2 no murmurs, no S3.Regular rate.  ABD: Soft non tender.   Ext: No edema  MS: Adequate though reduced ROM spine,   markedly reduced ROm left  shoulder,  Adequate in  hips and knees.  Skin: Intact, no ulcerations or rash noted.  Psych: Good eye contact, normal affect. Memory intact not anxious or depressed appearing.  CNS: CN 2-12 intact, power,  normal throughout.no focal deficits noted.   Assessment & Plan  Bursitis of deltoid, left 2 month history with  limited movement , no trauma refer ortho, has had success with intra articular injection in the past  Anxiety and depression Uncontrolled anxiety  Increase buspar dose  Nicotine dependence Patient is asked and  confirms current  Nicotine use.  Five to seven minutes of time is spent in counseling the patient of the need to quit smoking  Advice to quit is delivered clearly specifically in reducing the risk of developing heart disease, having a stroke, or of developing all types of cancer, especially lung and oral cancer. Improvement in breathing and exercise tolerance and quality of life is also discussed, as is the economic benefit.  Assessment of willingness to quit or to make an attempt to quit is made and documented  Assistance in quit attempt is made with several and varied options presented, based on patient's desire and need. These include  literature, local classes available, 1800 QUIT NOW number, OTC and prescription medication.  The GOAL to be NICOTINE FREE is re emphasized.  The patient has set a personal goal of either reduction or discontinuation and follow up is arranged between 6 an 16 weeks.  Start wellbutrin  Essential hypertension Controlled, no change in medication DASH diet and commitment to daily physical activity for a minimum of 30 minutes discussed and encouraged, as a part of hypertension management. The importance of attaining a healthy weight is also discussed.  BP/Weight 07/12/2016 03/02/2016 03/01/2016 01/28/2016 12/22/2015 10/27/2015 9/56/3875  Systolic BP 643 329 518 841 126  932 671  Diastolic BP 72 78 76 78 80 70 72  Wt. (Lbs) 157 158 158.12 153.12 155.2 156 152.4  BMI 23.87 24.02 24.04 23.28 23.6 23.72 23.17       Hyperlipidemia Hyperlipidemia:Low fat diet discussed and encouraged.   Lipid Panel  Lab Results  Component Value Date   CHOL 115 06/22/2016   HDL 34 (L) 06/22/2016   LDLCALC 56 06/22/2016   TRIG 126 06/22/2016   CHOLHDL 3.4  06/22/2016  needs to increase exercise     Hypothyroid Controlled, no change in medication   Vitamin D deficiency Transition to OTC daily supplement

## 2016-07-12 NOTE — Assessment & Plan Note (Signed)
Patient is asked and  confirms current  Nicotine use.  Five to seven minutes of time is spent in counseling the patient of the need to quit smoking  Advice to quit is delivered clearly specifically in reducing the risk of developing heart disease, having a stroke, or of developing all types of cancer, especially lung and oral cancer. Improvement in breathing and exercise tolerance and quality of life is also discussed, as is the economic benefit.  Assessment of willingness to quit or to make an attempt to quit is made and documented  Assistance in quit attempt is made with several and varied options presented, based on patient's desire and need. These include  literature, local classes available, 1800 QUIT NOW number, OTC and prescription medication.  The GOAL to be NICOTINE FREE is re emphasized.  The patient has set a personal goal of either reduction or discontinuation and follow up is arranged between 6 an 16 weeks.  Start wellbutrin

## 2016-07-17 ENCOUNTER — Encounter: Payer: Self-pay | Admitting: Family Medicine

## 2016-07-17 NOTE — Assessment & Plan Note (Signed)
Controlled, no change in medication  

## 2016-07-17 NOTE — Assessment & Plan Note (Signed)
Hyperlipidemia:Low fat diet discussed and encouraged.   Lipid Panel  Lab Results  Component Value Date   CHOL 115 06/22/2016   HDL 34 (L) 06/22/2016   LDLCALC 56 06/22/2016   TRIG 126 06/22/2016   CHOLHDL 3.4 06/22/2016  needs to increase exercise

## 2016-07-17 NOTE — Assessment & Plan Note (Signed)
Transition to OTC daily supplement

## 2016-07-17 NOTE — Assessment & Plan Note (Signed)
Controlled, no change in medication DASH diet and commitment to daily physical activity for a minimum of 30 minutes discussed and encouraged, as a part of hypertension management. The importance of attaining a healthy weight is also discussed.  BP/Weight 07/12/2016 03/02/2016 03/01/2016 01/28/2016 12/22/2015 10/27/2015 3/55/9741  Systolic BP 638 453 646 803 212 248 250  Diastolic BP 72 78 76 78 80 70 72  Wt. (Lbs) 157 158 158.12 153.12 155.2 156 152.4  BMI 23.87 24.02 24.04 23.28 23.6 23.72 23.17

## 2016-07-19 ENCOUNTER — Ambulatory Visit: Payer: Medicare HMO | Admitting: Orthopedic Surgery

## 2016-08-01 ENCOUNTER — Ambulatory Visit (INDEPENDENT_AMBULATORY_CARE_PROVIDER_SITE_OTHER): Payer: Medicare HMO | Admitting: Orthopedic Surgery

## 2016-08-01 ENCOUNTER — Ambulatory Visit (INDEPENDENT_AMBULATORY_CARE_PROVIDER_SITE_OTHER): Payer: Medicare HMO

## 2016-08-01 VITALS — BP 130/84 | HR 85 | Ht 68.0 in | Wt 158.0 lb

## 2016-08-01 DIAGNOSIS — M25512 Pain in left shoulder: Secondary | ICD-10-CM | POA: Diagnosis not present

## 2016-08-01 DIAGNOSIS — H2510 Age-related nuclear cataract, unspecified eye: Secondary | ICD-10-CM | POA: Diagnosis not present

## 2016-08-01 DIAGNOSIS — Z0100 Encounter for examination of eyes and vision without abnormal findings: Secondary | ICD-10-CM | POA: Diagnosis not present

## 2016-08-01 NOTE — Patient Instructions (Addendum)
You have received an injection of steroids into the joint. 15% of patients will have increased pain within the 24 hours postinjection.   This is transient and will go away.   We recommend that you use ice packs on the injection site for 20 minutes every 2 hours and extra strength Tylenol 2 tablets every 8 as needed until the pain resolves.  If you continue to have pain after taking the Tylenol and using the ice please call the office for further instructions.    Shoulder Pain Many things can cause shoulder pain, including:  An injury.  Moving the arm in the same way again and again (overuse).  Joint pain (arthritis).  Follow these instructions at home: Take these actions to help with your pain:  Squeeze a soft ball or a foam pad as much as you can. This helps to prevent swelling. It also makes the arm stronger.  Take over-the-counter and prescription medicines only as told by your doctor.  If told, put ice on the area: ? Put ice in a plastic bag. ? Place a towel between your skin and the bag. ? Leave the ice on for 20 minutes, 2-3 times per day. Stop putting on ice if it does not help with the pain.  If you were given a shoulder sling or immobilizer: ? Wear it as told. ? Remove it to shower or bathe. ? Move your arm as little as possible. ? Keep your hand moving. This helps prevent swelling.  Contact a doctor if:  Your pain gets worse.  Medicine does not help your pain.  You have new pain in your arm, hand, or fingers. Get help right away if:  Your arm, hand, or fingers: ? Tingle. ? Are numb. ? Are swollen. ? Are painful. ? Turn white or blue. This information is not intended to replace advice given to you by your health care provider. Make sure you discuss any questions you have with your health care provider. Document Released: 06/22/2007 Document Revised: 08/30/2015 Document Reviewed: 04/28/2014 Elsevier Interactive Patient Education  [employer].

## 2016-08-01 NOTE — Progress Notes (Signed)
New Patient History and Physical   Patient ID: Kelly Douglas, female   DOB: Jun 18, 1955, 61 y.o.   MRN: [id number]  Chief Complaint  Patient presents with  . Shoulder Injury    Left shoulder pain, no injury.    HPI Kelly Douglas is a 61 y.o. female.   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 However the pain was severe enough to cause her to lose forward elevation of the left shoulder despite no trauma. The pain was dull, aching and located over the anterolateral acromion and radiated down into the elbow but not below    Review of Systems Review of Systems  Musculoskeletal: Positive for arthralgias. Negative for neck pain.  Neurological: Negative for numbness.    Past Medical History:  Diagnosis Date  . Anxiety   . Arteriosclerotic cardiovascular disease (ASCVD) 2013   coronary calcification-left main, LAD and CX; small pericardial effusion  . Arthritis   . Chronic back pain   . Chronic bronchitis   . Chronic lung disease    Chronic scarring and volume loss-left lung; characteristics of a chronic infectious process-possible MAI  . Depression   . GERD (gastroesophageal reflux disease)    Dr Gala Romney EGD 09/2009->esophagitis, sm HH, antral erosions, atonic esophagus  . Hyperlipidemia   . Hypothyroid 1981 approx  . Scleroderma ([place])   . Seasonal allergies   . Syncope    Multiple spells over the past 40+ years, likely neurocardiogenic  . Tobacco abuse 06/24/2009    Past Surgical History:  Procedure Laterality Date  . BILATERAL SALPINGOOPHORECTOMY  2013    Dr. Elonda Husky; uterus remains in situ  . COLONOSCOPY  09/28/09   anal papilla otherwise normal  . COLONOSCOPY N/A 08/29/2012   [id number] polyp-removed as described above. tubular adenoma  .  ESOPHAGOGASTRODUODENOSCOPY  10/07/09   Dr. Barrie Dunker of esophageal mucosa, diffusely ?esophagitis (bx benign), small HH/antral erosions, erythema bx benign. 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 ESOPHAGOGASTRODUODENOSCOPY (EGD) WITH ESOPHAGEAL DILATION N/A 03/07/2012   [id number], patent, tubular esophagus of uncertain significance-status post biopsy )unremarkable). Hiatal hernia  . LAPAROSCOPIC APPENDECTOMY  07/16/2011   Procedure: APPENDECTOMY LAPAROSCOPIC;  Surgeon: Donato Heinz, MD;  Location: [facility];  Service: General;  Laterality: N/A;  . TUBAL LIGATION      Family History  Problem Relation Age of Onset  . Stroke Father   . Aneurysm Father   . Coronary artery disease Brother   . Hypertension Brother   . Down syndrome Brother   . Hypertension Brother   . Hypertension Brother        father/mother  . Aneurysm Mother   . Throat cancer Brother 49       throat cancer  . Hypertension Sister   . Diabetes Brother   . Diabetes Brother   . Anesthesia problems Neg Hx   . Hypotension Neg Hx   . Malignant hyperthermia Neg Hx   . Pseudochol deficiency Neg Hx   . Colon cancer Neg Hx     Social History Social History  Substance Use Topics  . Smoking status: Current Every Day Smoker    Packs/day: 0.50    Years: 40.00    Types: Cigarettes    Start date: 07/14/1971  . Smokeless tobacco: Never Used  Comment: 0.5 PPD current plans to reduce   . Alcohol use No     Comment: quit June 2012-used to drink 6 pack daily    Allergies  Allergen Reactions  . Aspirin Other (See Comments)    Nose bleeds    Current Outpatient Prescriptions  Medication Sig Dispense Refill  . Acetaminophen (TYLENOL ARTHRITIS PAIN PO) Take 2 tablets by mouth 3 (three) times daily.    Marland Kitchen albuterol (PROVENTIL HFA;VENTOLIN HFA) 108 (90 BASE) MCG/ACT inhaler Inhale 2 puffs into the lungs every 6 (six) hours as needed. Shortness of breath 6.7 g 3  . amLODipine (NORVASC) 5 MG tablet TAKE 1 TABLET  EVERY DAY 90 tablet 1  . buPROPion (WELLBUTRIN SR) 150 MG 12 hr tablet Take one tablet once daily for the first three days , then one tablet twice daily Qui5t smoking 2 weeks after starting medication 180 tablet 0  . busPIRone (BUSPAR) 10 MG tablet Take 1 tablet (10 mg total) by mouth 3 (three) times daily. 270 tablet 1  . cholestyramine (QUESTRAN) 4 g packet MIX 1/2 PACKET AND DRINK DAILY. MAY INCREASE TO 1 PACKET DAILY. DO NOT TAKE WITHIN 2 HOURS OF OTHER MEDICATIONS 90 packet 3  . dicyclomine (BENTYL) 10 MG capsule TAKE 1 CAPSULE THREE TIMES DAILY WITH MEALS AS NEEDED FOR  SPASMS 120 capsule 3  . DULoxetine (CYMBALTA) 60 MG capsule TAKE 1 CAPSULE EVERY DAY 90 capsule 1  . gabapentin (NEURONTIN) 600 MG tablet Take 1 tablet (600 mg total) by mouth 2 (two) times daily. 180 tablet 3  . levothyroxine (SYNTHROID, LEVOTHROID) 75 MCG tablet TAKE 1 TABLET BY MOUTH DAILY. 90 tablet 1  . montelukast (SINGULAIR) 10 MG tablet TAKE 1 TABLET AT BEDTIME 90 tablet 1  . Multiple Vitamins-Calcium (ONE-A-DAY WOMENS PO) Take 1 tablet by mouth daily.    . pantoprazole (PROTONIX) 40 MG tablet TAKE 1 TABLET TWICE DAILY 180 tablet 5  . pravastatin (PRAVACHOL) 40 MG tablet TAKE 1 TABLET EVERY DAY 90 tablet 1  . temazepam (RESTORIL) 30 MG capsule Take 1 capsule (30 mg total) by mouth at bedtime. 90 capsule 1   No current facility-administered medications for this visit.        Physical Exam BP 130/84   Pulse 85   Ht 5\' 8"  (1.727 m)   Wt 158 lb (71.7 kg)   BMI 24.02 kg/m  Physical Exam  Constitutional: She is oriented to person, place, and time. She appears well-developed and well-nourished.  Neurological: She is alert and oriented to person, place, and time.  Psychiatric: She has a normal mood and affect.  Vitals reviewed.  Ambulatory status normal with no assistive devices Right Shoulder Exam   Tenderness  The patient is experiencing no tenderness.    Range of Motion  The patient has normal right  shoulder ROM.  Muscle Strength  The patient has normal right shoulder strength.  Tests  Sulcus: absent  Other  Erythema: absent Scars: absent Sensation: normal Pulse: present   Left Shoulder Exam   Tenderness  The patient is experiencing tenderness in the acromion.  Range of Motion  Active Abduction: abnormal  Passive Abduction: abnormal  Extension: normal  Forward Flexion:  120 abnormal  External Rotation: normal   Muscle Strength  Abduction: 5/5  Internal Rotation: 5/5  External Rotation: 5/5  Supraspinatus: 5/5  Subscapularis: 5/5  Biceps: 5/5   Tests  Apprehension: negative Cross Arm: negative Drop Arm: negative Impingement: positive Sulcus: absent  Other  Erythema: absent Sensation:  normal Pulse: present        Data Reviewed Imaging of the left shoulder are independently reviewed and I interpreted these as normal glenohumeral joint with subtle proximal humeral migration suggesting rotator cuff insufficiency  Assessment  Encounter Diagnosis  Name Primary?  . Left shoulder pain, unspecified chronicity Yes    Probable bursitis  We advise and she agreed to a subacromial injection left shoulder  Procedure note the subacromial injection shoulder left   Verbal consent was obtained to inject the  Left   Shoulder  Timeout was completed to confirm the injection site is a subacromial space of the  left  shoulder  Medication used Depo-Medrol 40 mg and lidocaine 1% 3 cc  Anesthesia was provided by ethyl chloride  The injection was performed in the left  posterior subacromial space. After pinning the skin with alcohol and anesthetized the skin with ethyl chloride the subacromial space was injected using a 20-gauge needle. There were no complications  Sterile dressing was applied.

## 2016-08-05 ENCOUNTER — Other Ambulatory Visit: Payer: Self-pay | Admitting: Family Medicine

## 2016-08-05 DIAGNOSIS — Z1231 Encounter for screening mammogram for malignant neoplasm of breast: Secondary | ICD-10-CM

## 2016-08-11 ENCOUNTER — Ambulatory Visit (HOSPITAL_COMMUNITY)
Admission: RE | Admit: 2016-08-11 | Discharge: 2016-08-11 | Disposition: A | Discharge status: Home / Self Care | Payer: Medicare HMO | Source: Ambulatory Visit | Attending: Family Medicine | Admitting: Family Medicine

## 2016-08-11 DIAGNOSIS — Z1231 Encounter for screening mammogram for malignant neoplasm of breast: Secondary | ICD-10-CM | POA: Diagnosis not present

## 2016-08-23 ENCOUNTER — Ambulatory Visit (INDEPENDENT_AMBULATORY_CARE_PROVIDER_SITE_OTHER): Payer: Medicare HMO | Admitting: Internal Medicine

## 2016-08-23 VITALS — BP 116/78 | HR 91 | Temp 97.2°F | Ht 68.0 in | Wt 154.0 lb

## 2016-08-23 DIAGNOSIS — K219 Gastro-esophageal reflux disease without esophagitis: Secondary | ICD-10-CM | POA: Diagnosis not present

## 2016-08-23 DIAGNOSIS — R159 Full incontinence of feces: Secondary | ICD-10-CM

## 2016-08-23 NOTE — Patient Instructions (Signed)
Continue protonix 40 mg twice daily  Continue Questran 4 grams daily  GERD information  OV 6 months  Repeat colonscopy 2021

## 2016-08-23 NOTE — Progress Notes (Signed)
Primary Care Physician:  [name], MD Primary Gastroenterologist:  Dr. Gala Romney  Pre-Procedure History & Physical: HPI:  Kelly Douglas is a 61 y.o. female here for GERD and /fecal incontinence. Reflux well controlled on Protonix 40 mg twice daily. Questran at a dose of 4 g daily controlling bowel symptoms very well; denies constipation, no diarrhea and no fecal incontinence. The big concern is cost of about $60 for 6 week supply. Small colonic adenoma removed previously;  due for surveillance colonoscopy 2021  Past Medical History:  Diagnosis Date  . Anxiety   . Arteriosclerotic cardiovascular disease (ASCVD) 2013   coronary calcification-left main, LAD and CX; small pericardial effusion  . Arthritis   . Chronic back pain   . Chronic bronchitis   . Chronic lung disease    Chronic scarring and volume loss-left lung; characteristics of a chronic infectious process-possible MAI  . Depression   . GERD (gastroesophageal reflux disease)    Dr Gala Romney EGD 09/2009->esophagitis, sm HH, antral erosions, atonic esophagus  . Hyperlipidemia   . Hypothyroid 1981 approx  . Scleroderma ([place])   . Seasonal allergies   . Syncope    Multiple spells over the past 40+ years, likely neurocardiogenic  . Tobacco abuse 06/24/2009    Past Surgical History:  Procedure Laterality Date  . BILATERAL SALPINGOOPHORECTOMY  2013    Dr. Elonda Husky; uterus remains in situ  . COLONOSCOPY  09/28/09   anal papilla otherwise normal  . COLONOSCOPY N/A 08/29/2012   [id number] polyp-removed as described above. tubular adenoma  . ESOPHAGOGASTRODUODENOSCOPY  10/07/09   Dr. Barrie Dunker of esophageal mucosa, diffusely ?esophagitis (bx benign), small HH/antral erosions, erythema bx benign. 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 ESOPHAGOGASTRODUODENOSCOPY (EGD) WITH ESOPHAGEAL DILATION N/A 03/07/2012   [id number], patent, tubular esophagus of uncertain significance-status post biopsy )unremarkable). Hiatal  hernia  . LAPAROSCOPIC APPENDECTOMY  07/16/2011   Procedure: APPENDECTOMY LAPAROSCOPIC;  Surgeon: Donato Heinz, MD;  Location: [facility];  Service: General;  Laterality: N/A;  . TUBAL LIGATION      Prior to Admission medications   Medication Sig Start Date End Date Taking? Authorizing Provider  Acetaminophen (TYLENOL ARTHRITIS PAIN PO) Take 2 tablets by mouth 3 (three) times daily.   Yes [provider]  albuterol (PROVENTIL HFA;VENTOLIN HFA) 108 (90 BASE) MCG/ACT inhaler Inhale 2 puffs into the lungs every 6 (six) hours as needed. Shortness of breath 08/02/12  Yes [name], MD  amLODipine (NORVASC) 5 MG tablet TAKE 1 TABLET EVERY DAY 04/20/16  Yes [name], MD  buPROPion [facility] SR) 150 MG 12 hr tablet Take one tablet once daily for the first three days , then one tablet twice daily Qui5t smoking 2 weeks after starting medication 07/12/16  Yes [name], MD  busPIRone (BUSPAR) 10 MG tablet Take 1 tablet (10 mg total) by mouth 3 (three) times daily. 07/12/16  Yes [name], MD  dicyclomine (BENTYL) 10 MG capsule TAKE 1 CAPSULE THREE TIMES DAILY WITH MEALS AS NEEDED FOR  SPASMS 05/17/16  Yes [name], MD  DULoxetine (CYMBALTA) 60 MG capsule TAKE 1 CAPSULE EVERY DAY 04/20/16  Yes [name], MD  gabapentin (NEURONTIN) 600 MG tablet Take 1 tablet (600 mg total) by mouth 2 (two) times daily. 05/19/15  Yes [name], MD  levothyroxine (SYNTHROID, LEVOTHROID) 75 MCG tablet TAKE 1 TABLET BY MOUTH DAILY. 03/28/16  Yes [name], MD  montelukast (SINGULAIR) 10 MG tablet TAKE 1 TABLET  AT BEDTIME 04/20/16  Yes [name], MD  Multiple Vitamins-Calcium (ONE-A-DAY WOMENS PO) Take 1 tablet by mouth daily.   Yes [provider]  pantoprazole (PROTONIX) 40 MG tablet TAKE 1 TABLET TWICE DAILY 05/10/16  Yes Annitta Needs, NP  pravastatin (PRAVACHOL) 40 MG tablet TAKE 1 TABLET EVERY DAY 04/20/16  Yes [name],  MD  temazepam (RESTORIL) 30 MG capsule Take 1 capsule (30 mg total) by mouth at bedtime. 10/27/15  Yes [name], MD  cholestyramine (QUESTRAN) 4 g packet MIX 1/2 PACKET AND DRINK DAILY. MAY INCREASE TO 1 PACKET DAILY. DO NOT TAKE WITHIN 2 HOURS OF OTHER MEDICATIONS Patient not taking: Reported on 08/23/2016 03/15/16   [name], MD    Allergies as of 08/23/2016 - Review Complete 08/23/2016  Allergen Reaction Noted  . Aspirin Other (See Comments) 05/19/2015    Family History  Problem Relation Age of Onset  . Stroke Father   . Aneurysm Father   . Coronary artery disease Brother   . Hypertension Brother   . Down syndrome Brother   . Hypertension Brother   . Hypertension Brother        father/mother  . Aneurysm Mother   . Throat cancer Brother 49       throat cancer  . Hypertension Sister   . Diabetes Brother   . Diabetes Brother   . Anesthesia problems Neg Hx   . Hypotension Neg Hx   . Malignant hyperthermia Neg Hx   . Pseudochol deficiency Neg Hx   . Colon cancer Neg Hx     Social History   Social History  . Marital status: Married    Spouse name: N/A  . Number of children: 2  . Years of education: N/A   Occupational History  . disabled Not Employed   Social History Main Topics  . Smoking status: Current Every Day Smoker    Packs/day: 0.50    Years: 40.00    Types: Cigarettes    Start date: 07/14/1971  . Smokeless tobacco: Never Used     Comment: 0.5 PPD current plans to reduce   . Alcohol use No     Comment: quit June 2012-used to drink 6 pack daily  . Drug use: No  . Sexual activity: Yes    Birth control/ protection: Post-menopausal   Other Topics Concern  . Not on file   Social History Narrative  . No narrative on file    Review of Systems: See HPI, otherwise negative ROS  Physical Exam: BP 116/78   Pulse 91   Temp (!) 97.2 F (36.2 C) (Oral)   Ht 5\' 8"  (1.727 m)   Wt 154 lb (69.9 kg)   BMI 23.42 kg/m  General:   Alert,   Well-developed, well-nourished, pleasant and cooperative in NAD Neck:  Supple; no masses or thyromegaly. No significant cervical adenopathy. Lungs:  Clear throughout to auscultation.   No wheezes, crackles, or rhonchi. No acute distress. Heart:  Regular rate and rhythm; no murmurs, clicks, rubs,  or gallops. Abdomen: Non-distended, normal bowel sounds.  Soft and nontender without appreciable mass or hepatosplenomegaly.  Pulses:  Normal pulses noted. Extremities:  Without clubbing or edema.  Impression:  Fecal incontinence and GERD much improved. History of scleroderma. Doing well on twice a day PPI therapy and  Cholestyramine.  Recommendations:  Continue protonix 40 mg twice daily  Continue Questran 4 grams daily;  discussed dosing intervals and avoiding coadministration with other medications.  GERD information  OV 6 months  Repeat colonscopy 2021    Notice: This dictation was prepared with Dragon dictation along with smaller phrase technology. Any transcriptional errors that result from this process are unintentional and may not be corrected upon review.

## 2016-08-23 NOTE — Progress Notes (Signed)
deleted

## 2016-09-01 ENCOUNTER — Other Ambulatory Visit: Payer: Self-pay | Admitting: Family Medicine

## 2016-09-01 DIAGNOSIS — G4700 Insomnia, unspecified: Secondary | ICD-10-CM

## 2016-09-01 DIAGNOSIS — M549 Dorsalgia, unspecified: Secondary | ICD-10-CM

## 2016-09-06 ENCOUNTER — Other Ambulatory Visit: Payer: Self-pay | Admitting: Family Medicine

## 2016-09-15 ENCOUNTER — Other Ambulatory Visit: Payer: Self-pay | Admitting: Family Medicine

## 2016-09-15 DIAGNOSIS — Z9109 Other allergy status, other than to drugs and biological substances: Secondary | ICD-10-CM

## 2016-09-15 DIAGNOSIS — M549 Dorsalgia, unspecified: Secondary | ICD-10-CM

## 2016-09-16 ENCOUNTER — Ambulatory Visit (INDEPENDENT_AMBULATORY_CARE_PROVIDER_SITE_OTHER): Payer: Medicare HMO | Admitting: Orthopedic Surgery

## 2016-09-16 DIAGNOSIS — M25512 Pain in left shoulder: Secondary | ICD-10-CM

## 2016-09-16 DIAGNOSIS — M75112 Incomplete rotator cuff tear or rupture of left shoulder, not specified as traumatic: Secondary | ICD-10-CM

## 2016-09-16 MED ORDER — PREDNISONE 10 MG PO TABS: 10.0000 mg | ORAL_TABLET | Freq: Every day | ORAL | 0 refills | Status: DC

## 2016-09-16 MED ORDER — TRAMADOL-ACETAMINOPHEN 37.5-325 MG PO TABS: 1.0000 | ORAL_TABLET | ORAL | 5 refills | Status: DC | PRN

## 2016-09-16 NOTE — Patient Instructions (Signed)
Steps to Quit Smoking Smoking tobacco can be bad for your health. It can also affect almost every organ in your body. Smoking puts you and people around you at risk for many serious Liev [place]-lasting (chronic) diseases. Quitting smoking is hard, but it is one of the best things that you can do for your health. It is never too late to quit. What are the benefits of quitting smoking? When you quit smoking, you lower your risk for getting serious diseases and conditions. They can include:  Lung cancer or lung disease.  Heart disease.  Stroke.  Heart attack.  Not being able to have children (infertility).  Weak bones (osteoporosis) and broken bones (fractures).  If you have coughing, wheezing, and shortness of breath, those symptoms may get better when you quit. You may also get sick less often. If you are pregnant, quitting smoking can help to lower your chances of having a baby of low birth weight. What can I do to help me quit smoking? Talk with your doctor about what can help you quit smoking. Some things you can do (strategies) include:  Quitting smoking totally, instead of slowly cutting back how much you smoke over a period of time.  Going to in-person counseling. You are more likely to quit if you go to many counseling sessions.  Using resources and support systems, such as: ? Online chats with a counselor. ? Phone quitlines. ? Printed self-help materials. ? Support groups or group counseling. ? Text messaging programs. ? Mobile phone apps or applications.  Taking medicines. Some of these medicines may have nicotine in them. If you are pregnant or breastfeeding, do not take any medicines to quit smoking unless your doctor says it is okay. Talk with your doctor about counseling or other things that can help you.  Talk with your doctor about using more than one strategy at the same time, such as taking medicines while you are also going to in-person counseling. This can help make  quitting easier. What things can I do to make it easier to quit? Quitting smoking might feel very hard at first, but there is a lot that you can do to make it easier. Take these steps:  Talk to your family and friends. Ask them to support and encourage you.  Call phone quitlines, reach out to support groups, or work with a counselor.  Ask people who smoke to not smoke around you.  Avoid places that make you want (trigger) to smoke, such as: ? Bars. ? Parties. ? Smoke-break areas at work.  Spend time with people who do not smoke.  Lower the stress in your life. Stress can make you want to smoke. Try these things to help your stress: ? Getting regular exercise. ? Deep-breathing exercises. ? Yoga. ? Meditating. ? Doing a body scan. To do this, close your eyes, focus on one area of your body at a time from head to toe, and notice which parts of your body are tense. Try to relax the muscles in those areas.  Download or buy apps on your mobile phone or tablet that can help you stick to your quit plan. There are many free apps, such as QuitGuide from the CDC (Centers for Disease Control and Prevention). You can find more support from smokefree.gov and other websites.  This information is not intended to replace advice given to you by your health care provider. Make sure you discuss any questions you have with your health care provider. Document Released: 10/30/2008 Document   Revised: 09/01/2015 Document Reviewed: 05/20/2014 Elsevier Interactive Patient Education  2018 [employer] Inc[employer]  

## 2016-09-16 NOTE — Progress Notes (Signed)
Routine follow-up  Chief Complaint  Patient presents with  . 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 However the pain was severe enough to cause her to lose forward elevation of the left shoulder despite no trauma. The pain was dull, aching and located over the anterolateral acromion and radiated down into the elbow but not below  We treated her with injection for bursitis  She returns for reevaluation with no significant improvement  Her x-ray did show proximal migration of humerus possible subluxation of humerus related to most commonly rotator cuff insufficiency   Review of Systems  Musculoskeletal: Negative for neck pain.  Neurological: Positive for focal weakness. Negative for tingling.   Physical exam her appearance she has a small frame is oriented 3 mood and affect are normal gait is normal  Left shoulder is tender in the rotator interval mild lateral acromion moderate posterior acromion moderate painful range of motion in all planes especially between 120 950 of flexion and the scapular plane normal external rotation decreased internal rotation Stable in abduction external rotation Rotator cuff weakness in abduction and flexion normal internal and external rotation Skin has multiple areas of darkening and she did get some skin discoloration injection spray last time Neurovascular exam is intact Neck supple nontender Right shoulder full range of motion  Initial x-ray showed proximal migration of the humerus suggestive of chronic rotator cuff insufficiency   Encounter Diagnoses  Name Primary?  . Left shoulder pain, unspecified chronicity   . Incomplete tear of left rotator cuff Yes    Meds ordered this encounter   Medications  . traMADol-acetaminophen (ULTRACET) 37.5-325 MG tablet    Sig: Take 1 tablet by mouth every 4 (four) hours as needed.    Dispense:  30 tablet    Refill:  5  . predniSONE (DELTASONE) 10 MG tablet    Sig: Take 1 tablet (10 mg total) by mouth daily.    Dispense:  14 tablet    Refill:  0   Return after MRI left shoulder

## 2016-09-26 ENCOUNTER — Ambulatory Visit (HOSPITAL_COMMUNITY)
Admission: RE | Admit: 2016-09-26 | Discharge: 2016-09-26 | Disposition: A | Discharge status: Home / Self Care | Payer: Medicare HMO | Source: Ambulatory Visit | Attending: Orthopedic Surgery | Admitting: Orthopedic Surgery

## 2016-09-26 DIAGNOSIS — M25512 Pain in left shoulder: Secondary | ICD-10-CM | POA: Diagnosis not present

## 2016-09-26 DIAGNOSIS — M19012 Primary osteoarthritis, left shoulder: Secondary | ICD-10-CM | POA: Diagnosis not present

## 2016-09-26 DIAGNOSIS — S46812A Strain of other muscles, fascia and tendons at shoulder and upper arm level, left arm, initial encounter: Secondary | ICD-10-CM | POA: Diagnosis not present

## 2016-09-26 DIAGNOSIS — X58XXXA Exposure to other specified factors, initial encounter: Secondary | ICD-10-CM | POA: Diagnosis not present

## 2016-09-30 ENCOUNTER — Ambulatory Visit: Payer: Medicare HMO | Admitting: Orthopedic Surgery

## 2016-10-03 DIAGNOSIS — J449 Chronic obstructive pulmonary disease, unspecified: Secondary | ICD-10-CM | POA: Diagnosis not present

## 2016-10-03 DIAGNOSIS — F17200 Nicotine dependence, unspecified, uncomplicated: Secondary | ICD-10-CM | POA: Diagnosis not present

## 2016-10-03 DIAGNOSIS — I10 Essential (primary) hypertension: Secondary | ICD-10-CM | POA: Diagnosis not present

## 2016-10-03 DIAGNOSIS — M349 Systemic sclerosis, unspecified: Secondary | ICD-10-CM | POA: Diagnosis not present

## 2016-10-04 ENCOUNTER — Encounter: Payer: Self-pay | Admitting: Orthopedic Surgery

## 2016-10-04 ENCOUNTER — Other Ambulatory Visit: Payer: Self-pay | Admitting: Orthopedic Surgery

## 2016-10-04 ENCOUNTER — Ambulatory Visit (INDEPENDENT_AMBULATORY_CARE_PROVIDER_SITE_OTHER): Payer: Medicare HMO | Admitting: Orthopedic Surgery

## 2016-10-04 VITALS — BP 125/78 | HR 95 | Ht 68.0 in | Wt 158.0 lb

## 2016-10-04 DIAGNOSIS — M25512 Pain in left shoulder: Secondary | ICD-10-CM

## 2016-10-04 DIAGNOSIS — M7582 Other shoulder lesions, left shoulder: Secondary | ICD-10-CM

## 2016-10-04 DIAGNOSIS — M19019 Primary osteoarthritis, unspecified shoulder: Secondary | ICD-10-CM | POA: Diagnosis not present

## 2016-10-04 DIAGNOSIS — M75112 Incomplete rotator cuff tear or rupture of left shoulder, not specified as traumatic: Secondary | ICD-10-CM | POA: Diagnosis not present

## 2016-10-04 MED ORDER — TRAMADOL HCL 50 MG PO TABS: 50.0000 mg | ORAL_TABLET | Freq: Four times a day (QID) | ORAL | 5 refills | Status: DC | PRN

## 2016-10-04 MED ORDER — MELOXICAM 7.5 MG PO TABS: 7.5000 mg | ORAL_TABLET | Freq: Every day | ORAL | 1 refills | Status: DC

## 2016-10-04 NOTE — Progress Notes (Signed)
FOLLOW UP   Chief Complaint  Patient presents with  . Results    Left shoulder MRI   HPI: 61 year old female follow-up after MRI left shoulder. Patient was thought to have a rotator cuff tear. She had failed conservative treatment. Her MRI shows that she has glenohumeral arthritis without focal defect, mild acromial clavicular joint arthritis. Rotator cuff tendinosis with low-grade interstitial tearing of the subscapularis tendinosis of the supraspinatus infraspinatus labrum intact  She still complains of pain with activities of daily living and with overhead activity  Review of systems as far as the upper extremity goes she is not having any numbness or tingling. She does have muscle motor weakness in the rotator cuff   Meds ordered this encounter  Medications  . meloxicam (MOBIC) 7.5 MG tablet    Sig: Take 1 tablet (7.5 mg total) by mouth daily.    Dispense:  30 tablet    Refill:  1  . traMADol (ULTRAM) 50 MG tablet    Sig: Take 1 tablet (50 mg total) by mouth every 6 (six) hours as needed.    Dispense:  60 tablet    Refill:  5     Encounter Diagnoses  Name Primary?  . Left shoulder pain, unspecified chronicity Yes  . Incomplete tear of left rotator cuff   . Arthritis, shoulder region   . Rotator cuff tendonitis, left    No improvement to this point. I went ahead and looked at her MRI. There is no surgical rotator cuff tear. There is tendinosis within the tendons of the rotator cuff and arthritis is noted in the glenohumeral joint looks mild to me. The acromioclavicular joint shows what I think to be age-related arthritis.    Recommend physical therapy for her. This does not appear to need surgery

## 2016-10-04 NOTE — Patient Instructions (Addendum)
Steps to Quit Smoking Smoking tobacco can be bad for your health. It can also affect almost every organ in your body. Smoking puts you and people around you at risk for many serious Kelly Douglas-lasting (chronic) diseases. Quitting smoking is hard, but it is one of the best things that you can do for your health. It is never too late to quit. What are the benefits of quitting smoking? When you quit smoking, you lower your risk for getting serious diseases and conditions. They can include:  Lung cancer or lung disease.  Heart disease.  Stroke.  Heart attack.  Not being able to have children (infertility).  Weak bones (osteoporosis) and broken bones (fractures).  If you have coughing, wheezing, and shortness of breath, those symptoms may get better when you quit. You may also get sick less often. If you are pregnant, quitting smoking can help to lower your chances of having a baby of low birth weight. What can I do to help me quit smoking? Talk with your doctor about what can help you quit smoking. Some things you can do (strategies) include:  Quitting smoking totally, instead of slowly cutting back how much you smoke over a period of time.  Going to in-person counseling. You are more likely to quit if you go to many counseling sessions.  Using resources and support systems, such as: ? Online chats with a counselor. ? Phone quitlines. ? Printed self-help materials. ? Support groups or group counseling. ? Text messaging programs. ? Mobile phone apps or applications.  Taking medicines. Some of these medicines may have nicotine in them. If you are pregnant or breastfeeding, do not take any medicines to quit smoking unless your doctor says it is okay. Talk with your doctor about counseling or other things that can help you.  Talk with your doctor about using more than one strategy at the same time, such as taking medicines while you are also going to in-person counseling. This can help make  quitting easier. What things can I do to make it easier to quit? Quitting smoking might feel very hard at first, but there is a lot that you can do to make it easier. Take these steps:  Talk to your family and friends. Ask them to support and encourage you.  Call phone quitlines, reach out to support groups, or work with a counselor.  Ask people who smoke to not smoke around you.  Avoid places that make you want (trigger) to smoke, such as: ? Bars. ? Parties. ? Smoke-break areas at work.  Spend time with people who do not smoke.  Lower the stress in your life. Stress can make you want to smoke. Try these things to help your stress: ? Getting regular exercise. ? Deep-breathing exercises. ? Yoga. ? Meditating. ? Doing a body scan. To do this, close your eyes, focus on one area of your body at a time from head to toe, and notice which parts of your body are tense. Try to relax the muscles in those areas.  Download or buy apps on your mobile phone or tablet that can help you stick to your quit plan. There are many free apps, such as QuitGuide from the CDC (Centers for Disease Control and Prevention). You can find more support from smokefree.gov and other websites.  This information is not intended to replace advice given to you by your health care provider. Make sure you discuss any questions you have with your health care provider. Document Released: 10/30/2008 Document   Revised: 09/01/2015 Document Reviewed: 05/20/2014 Elsevier Interactive Patient Education  2018 [facility]   START NEW MEDICATION : MOBIC 1 DAILY

## 2016-10-18 ENCOUNTER — Other Ambulatory Visit: Payer: Self-pay | Admitting: Acute Care

## 2016-10-18 DIAGNOSIS — F17210 Nicotine dependence, cigarettes, uncomplicated: Principal | ICD-10-CM

## 2016-10-18 DIAGNOSIS — Z122 Encounter for screening for malignant neoplasm of respiratory organs: Secondary | ICD-10-CM

## 2016-10-21 ENCOUNTER — Telehealth (HOSPITAL_COMMUNITY): Payer: Self-pay | Admitting: Specialist

## 2016-10-21 NOTE — Telephone Encounter (Signed)
L/m request pt to call back we need to R/S Kelly Douglas will not be here on 10/24/16. NF 10/21/16

## 2016-10-24 ENCOUNTER — Ambulatory Visit (HOSPITAL_COMMUNITY): Payer: Medicare HMO | Admitting: Specialist

## 2016-10-24 ENCOUNTER — Telehealth (HOSPITAL_COMMUNITY): Payer: Self-pay | Admitting: Family Medicine

## 2016-10-24 NOTE — Telephone Encounter (Signed)
10/8  9:27 pt left [place] a message to change appt but when I called her back I got her voice mail.  I left her another message about rescheduling.

## 2016-10-31 ENCOUNTER — Encounter: Payer: Self-pay | Admitting: Acute Care

## 2016-10-31 ENCOUNTER — Ambulatory Visit (INDEPENDENT_AMBULATORY_CARE_PROVIDER_SITE_OTHER): Payer: Medicare HMO | Admitting: Acute Care

## 2016-10-31 ENCOUNTER — Ambulatory Visit (INDEPENDENT_AMBULATORY_CARE_PROVIDER_SITE_OTHER)
Admission: RE | Admit: 2016-10-31 | Discharge: 2016-10-31 | Disposition: A | Discharge status: Home / Self Care | Payer: Medicare HMO | Source: Ambulatory Visit | Attending: Acute Care | Admitting: Acute Care

## 2016-10-31 DIAGNOSIS — F17210 Nicotine dependence, cigarettes, uncomplicated: Secondary | ICD-10-CM

## 2016-10-31 DIAGNOSIS — Z87891 Personal history of nicotine dependence: Secondary | ICD-10-CM | POA: Diagnosis not present

## 2016-10-31 DIAGNOSIS — Z122 Encounter for screening for malignant neoplasm of respiratory organs: Secondary | ICD-10-CM

## 2016-10-31 NOTE — Progress Notes (Signed)
Shared Decision Making Visit Lung Cancer Screening Program [phone])   Eligibility:  Age 61 y.o.  Pack Years Smoking History Calculation 45 pack year smoking history (# packs/per year x # years smoked)  Recent History of coughing up blood  no  Unexplained weight loss? no ( >Than 15 pounds within the last 6 months )  Prior History Lung / other cancer no (Diagnosis within the last 5 years already requiring surveillance chest CT Scans).  Smoking Status Current Smoker  Former Smokers: Years since quit: NA  Quit Date: NA  Visit Components:  Discussion included one or more decision making aids. yes  Discussion included risk/benefits of screening. yes  Discussion included potential follow up diagnostic testing for abnormal scans. yes  Discussion included meaning and risk of over diagnosis. yes  Discussion included meaning and risk of False Positives. yes  Discussion included meaning of total radiation exposure. yes  Counseling Included:  Importance of adherence to annual lung cancer LDCT screening. yes  Impact of comorbidities on ability to participate in the program. yes  Ability and willingness to under diagnostic treatment. yes  Smoking Cessation Counseling:  Current Smokers:   Discussed importance of smoking cessation. yes  Information about tobacco cessation classes and interventions provided to patient. yes  Patient provided with "ticket" for LDCT Scan. yes  Symptomatic Patient. no  Counseling NA  Diagnosis Code: Tobacco Use Z72.0  Asymptomatic Patient yes  Counseling (Intermediate counseling: > three minutes counseling) Q2297  Former Smokers:   Discussed the importance of maintaining cigarette abstinence. yes  Diagnosis Code: Personal History of Nicotine Dependence. L89.211  Information about tobacco cessation classes and interventions provided to patient. Yes  Patient provided with "ticket" for LDCT Scan. yes  Written Order for Lung Cancer  Screening with LDCT placed in Epic. Yes (CT Chest Lung Cancer Screening Low Dose W/O CM) [id number] Z12.2-Screening of respiratory organs Z87.891-Personal history of nicotine dependence  I have spent 25 minutes of face to face time with Kelly Douglas  discussing the risks and benefits of lung cancer screening. We viewed a power point together that explained in detail the above noted topics. We paused at intervals to allow for questions to be asked and answered to ensure understanding.We discussed that the single most powerful action that she can take to decrease her risk of developing lung cancer is to quit smoking. We discussed whether or not she is ready to commit to setting a quit date. She is currently not ready to set a quit date.  We discussed options for tools to aid in quitting smoking including nicotine replacement therapy, non-nicotine medications, support groups, Quit Smart classes, and behavior modification. We discussed that often times setting smaller, more achievable goals, such as eliminating 1 cigarette a day for a week and then 2 cigarettes a day for a week can be helpful in slowly decreasing the number of cigarettes smoked. This allows for a sense of accomplishment as well as providing a clinical benefit. I gave her the " Be Stronger Than Your Excuses" card with contact information for community resources, classes, free nicotine replacement therapy, and access to mobile apps, text messaging, and on-line smoking cessation help. I have also given her my card and contact information in the event she needs to contact me. We discussed the time and location of the scan, and that either Kelly Glassman RN or I will call with the results within 24-48 hours of receiving them. I have offered her  a copy of the power point  we viewed  as a resource in the event they need reinforcement of the concepts we discussed today in the office. The patient verbalized understanding of all of  the above and had no further  questions upon leaving the office. They have my contact information in the event they have any further questions.  I spent 4 minutes counseling on smoking cessation and the health risks of continued tobacco abuse.  I explained to the patient that there has been a high incidence of coronary artery disease noted on these exams. I explained that this is a non-gated exam therefore degree or severity cannot be determined. This patient is currently on statin therapy. I have asked the patient to follow-up with their PCP regarding any incidental finding of coronary artery disease and management with diet or medication as their PCP  feels is clinically indicated. The patient verbalized understanding of the above and had no further questions upon completion of the visit.      Magdalen Spatz, NP 10/31/2016

## 2016-11-02 ENCOUNTER — Ambulatory Visit (HOSPITAL_COMMUNITY): Payer: Medicare HMO | Attending: Orthopedic Surgery

## 2016-11-02 DIAGNOSIS — R29898 Other symptoms and signs involving the musculoskeletal system: Secondary | ICD-10-CM | POA: Insufficient documentation

## 2016-11-02 DIAGNOSIS — M25512 Pain in left shoulder: Secondary | ICD-10-CM | POA: Insufficient documentation

## 2016-11-02 DIAGNOSIS — M25612 Stiffness of left shoulder, not elsewhere classified: Secondary | ICD-10-CM | POA: Diagnosis not present

## 2016-11-02 DIAGNOSIS — G8929 Other chronic pain: Secondary | ICD-10-CM | POA: Diagnosis not present

## 2016-11-02 NOTE — Therapy (Signed)
[place], [place], [address] Phone: [phone]   Fax:  [phone]  Occupational Therapy Evaluation  Patient Details  Name: Kelly Douglas MRN: [id number] Date of Birth: 1955-11-26 Referring Provider: Arther Abbott. MD  Encounter Date: 11/02/2016      OT End of Session - 11/02/16 1623    Visit Number 1   Number of Visits 8   Date for OT Re-Evaluation 12/02/16   Authorization Type [employer]   Authorization Time Period before 10th visit   Authorization - Visit Number 1   Authorization - Number of Visits 10   OT Start Time 2505   OT Stop Time 1430   OT Time Calculation (min) 41 min   Activity Tolerance Patient tolerated treatment well   Behavior During Therapy WFL for tasks assessed/performed      Past Medical History:  Diagnosis Date  . Anxiety   . Arteriosclerotic cardiovascular disease (ASCVD) 2013   coronary calcification-left main, LAD and CX; small pericardial effusion  . Arthritis   . Chronic back pain   . Chronic bronchitis   . Chronic lung disease    Chronic scarring and volume loss-left lung; characteristics of a chronic infectious process-possible MAI  . Depression   . GERD (gastroesophageal reflux disease)    Dr Gala Romney EGD 09/2009->esophagitis, sm HH, antral erosions, atonic esophagus  . Hyperlipidemia   . Hypothyroid 1981 approx  . Scleroderma ([place])   . Seasonal allergies   . Syncope    Multiple spells over the past 40+ years, likely neurocardiogenic  . Tobacco abuse 06/24/2009    Past Surgical History:  Procedure Laterality Date  . BILATERAL SALPINGOOPHORECTOMY  2013    Dr. Elonda Husky; uterus remains in situ  . COLONOSCOPY  09/28/09   anal papilla otherwise normal  . COLONOSCOPY N/A 08/29/2012   LZJ:QBHALPF polyp-removed as described above. tubular adenoma  . ESOPHAGOGASTRODUODENOSCOPY  10/07/09   Dr. Barrie Dunker of esophageal mucosa, diffusely ?esophagitis (bx benign),  small HH/antral erosions, erythema bx benign. 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 ESOPHAGOGASTRODUODENOSCOPY (EGD) WITH ESOPHAGEAL DILATION N/A 03/07/2012   XTK:[id number], patent, tubular esophagus of uncertain significance-status post biopsy )unremarkable). Hiatal hernia  . LAPAROSCOPIC APPENDECTOMY  07/16/2011   Procedure: APPENDECTOMY LAPAROSCOPIC;  Surgeon: Donato Heinz, MD;  Location: [facility];  Service: General;  Laterality: N/A;  . TUBAL LIGATION      There were no vitals filed for this visit.      Subjective Assessment - 11/02/16 1358    Subjective  S: It's gotten to the point where it is so bad I can't lift it.    Pertinent History Patient is a 61 y/o female S/P left shoulder pain which originally began 4 years ago. patient reports that she can not recall how her pain began. She states that in the past she was able to complete exercises and stretches on her own and was able to decrease the pain. A few months ago, she states the pain has returned and is preventing her from using her LUE during daily tasks. Dr. Ruthe Mannan notes states that patient has glenohumeral arthritis, RC teninosis (supraspinatus, infraspinatus), low grade interstitial tearing of subscapularis. Dr. Aline Brochure has referred patient to occupational therapy for evaluation and treatment.    Special Tests FOTO score: 50/100   Patient Stated Goals To be able to use her LUE normal.   Currently in Pain? Yes  8/10 with movement   Pain Score 4    Pain Location Shoulder  Pain Orientation Left   Pain Descriptors / Indicators Constant;Aching;Nagging   Pain Type Chronic pain   Pain Radiating Towards sometimes goes down the arm to the elbow   Pain Onset More than a month ago   Pain Frequency Constant   Aggravating Factors  movement and use   Pain Relieving Factors pain medication   Effect of Pain on Daily Activities severe effect   Multiple Pain Sites No           [facility] OT Assessment - 11/02/16 1402       Assessment   Diagnosis left shoulder pain   Referring Provider Arther Abbott. MD   Onset Date --  initially 4 years ago. Comes and goes.    Assessment 12/05/16   Prior Therapy None     Precautions   Precautions None     Restrictions   Weight Bearing Restrictions No     Balance Screen   Has the patient fallen in the past 6 months No     Home  Environment   Family/patient expects to be discharged to: Private residence   Lives With Spouse     Prior Function   Level of Independence Independent   Vocation On disability     ADL   ADL comments Difficulty fixing hair, lifting arm up above shoulder level     Mobility   Mobility Status Independent     Written Expression   Dominant Hand Right     Vision - History   Baseline Vision Wears glasses all the time     ROM / Strength   AROM / PROM / Strength Strength;PROM;AROM     Palpation   Palpation comment Max fascial restrictions in left upper arm, trapezius, and scapularis region.      AROM   Overall AROM Comments Assessed seated. IR/er adducted.   AROM Assessment Site Shoulder   Right/Left Shoulder Left   Left Shoulder Flexion 142 Degrees   Left Shoulder ABduction 57 Degrees   Left Shoulder Internal Rotation 90 Degrees   Left Shoulder External Rotation 70 Degrees     PROM   Overall PROM Comments Assessed supine. IR/er adducted.   PROM Assessment Site Shoulder   Right/Left Shoulder Left   Left Shoulder Flexion 150 Degrees   Left Shoulder ABduction 180 Degrees   Left Shoulder Internal Rotation 90 Degrees   Left Shoulder External Rotation 90 Degrees     Strength   Overall Strength Comments Assessed seated. IR/er adducted.   Strength Assessment Site Shoulder   Right/Left Shoulder Left   Left Shoulder Flexion 3-/5   Left Shoulder ABduction 3-/5   Left Shoulder Internal Rotation 3/5   Left Shoulder External Rotation 3/5                         OT Education - 11/02/16 1422    Education  provided Yes   Education Details AA/ROM shoulder exercises   Person(s) Educated Patient   Methods Explanation;Handout;Verbal cues;Demonstration   Comprehension Returned demonstration;Verbalized understanding;Tactile cues required          OT Short Term Goals - 11/02/16 1629      OT SHORT TERM GOAL #1   Title Patient will be educated and independent with HEP to increase functional use of LUE during daily tasks with decreased pain.   Time 4   Period Weeks   Status New   Target Date 11/30/16     OT SHORT TERM GOAL #2  Title Patient will increase A/ROM of her LUE to WNL to increase ability to complete grooming tasks such fixing her hair.   Time 4   Period Weeks   Status New     OT SHORT TERM GOAL #3   Title Patient will increase LUE strength to 4/5 to increase ability to return to completing normal housekeeping tasks with increased comfort.    Time 4   Period Weeks   Status New     OT SHORT TERM GOAL #4   Title Patient will decrease fascial restrictions to min amount or less to increase functional mobility of LUE.    Time 4   Period Weeks   Status New     OT SHORT TERM GOAL #5   Title Patient will decrease pain level to 2/10 or less when completing daily tasks.    Time 4   Period Weeks   Status New                  Plan - 11/02/16 1624    Clinical Impression Statement A: Patient is a 61 y/o female S/P left shoulder pain causing increased fascial restrictions, decreased strength and ROM resulting in difficulty completing daily tasks using LUE.   Occupational Profile and client history currently impacting functional performance Motivated to return to prior level of function, strong social support at home.   Occupational performance deficits (Please refer to evaluation for details): ADL's;IADL's;Rest and Sleep;Leisure   Rehab Potential Excellent   Current Impairments/barriers affecting progress: patient condition is chronic at this point.    OT Frequency 2x /  week   OT Duration 4 weeks   OT Treatment/Interventions Self-care/ADL training;Cryotherapy;Electrical Stimulation;Passive range of motion;Moist Heat;DME and/or AE instruction;Therapeutic activities;Therapeutic exercises;Ultrasound;Manual Therapy;Patient/family education   Plan P: patient will benefit from skilled OT services to increase use of LUE during functional tasks. Treatment Plan: myofascial release, manual stretching, AA/ROM, A/ROM, scapular and shoulder strengthening and stability.    Clinical Decision Making Limited treatment options, no task modification necessary   OT Home Exercise Plan 10/17: AA/ROM Shoulder exercises.    Consulted and Agree with Plan of Care Patient      Patient will benefit from skilled therapeutic intervention in order to improve the following deficits and impairments:  Decreased strength, Pain, Decreased range of motion, Increased fascial restricitons, Impaired UE functional use  Visit Diagnosis: Other symptoms and signs involving the musculoskeletal system - Plan: Ot plan of care cert/re-cert  Stiffness of left shoulder, not elsewhere classified - Plan: Ot plan of care cert/re-cert  Chronic left shoulder pain - Plan: Ot plan of care cert/re-cert      G-Codes - 62/13/08 1634    Functional Assessment Tool Used (Outpatient only) FOTO score: 5-/100 (50% impaired)   Functional Limitation Carrying, moving and handling objects   Carrying, Moving and Handling Objects Current Status (M5784) At least 40 percent but less than 60 percent impaired, limited or restricted   Carrying, Moving and Handling Objects Goal Status (O9629) At least 20 percent but less than 40 percent impaired, limited or restricted      Problem List Patient Active Problem List   Diagnosis Date Noted  . Bursitis of deltoid, left 07/12/2016  . Pollen allergies 05/19/2015  . Nicotine dependence 09/17/2013  . Cervical pain (neck) 06/21/2013  . Back pain with radiation 06/21/2013  . IDA  (iron deficiency anemia) 08/10/2012  . Leg cramps 04/25/2012  . Essential hypertension 02/22/2012  . Cardiorenal syndrome 02/02/2012  . Insomnia 05/09/2011  .  GERD (gastroesophageal reflux disease)   . Scleroderma ([place])   . Chronic lung disease   . Hypothyroid   . Arteriosclerotic cardiovascular disease (ASCVD)   . Syncope   . Anxiety and depression 06/24/2009  . Vitamin D deficiency 03/18/2009  . Hyperlipidemia 03/18/2009   Ailene Ravel, OTR/L,CBIS  [phone]  11/02/2016, 4:37 PM  [place] [address] [place], [place], [address] Phone: [phone]   Fax:  [phone]  Name: Kelly Douglas MRN: [id number] Date of Birth: [birth year]

## 2016-11-02 NOTE — Patient Instructions (Signed)
Perform each exercise ____10-15____ reps. 2-3x days.   Protraction - STANDING  Start by holding a wand or cane at chest height.  Next, slowly push the wand outwards in front of your body so that your elbows become fully straightened. Then, return to the original position.     Shoulder FLEXION - STANDING - PALMS UP  In the standing position, hold a wand/cane with both arms, palms up on both sides. Raise up the wand/cane allowing your unaffected arm to perform most of the effort. Your affected arm should be partially relaxed.      Internal/External ROTATION - STANDING  In the standing position, hold a wand/cane with both hands keeping your elbows bent. Move your arms and wand/cane to one side.  Your affected arm should be partially relaxed while your unaffected arm performs most of the effort.       Shoulder ABDUCTION - STANDING  While holding a wand/cane palm face up on the injured side and palm face down on the uninjured side, slowly raise up your injured arm to the side.                     Horizontal Abduction/Adduction      Straight arms holding cane at shoulder height, bring cane to right, center, left. Repeat starting to left.   Copyright  VHI. All rights reserved.       

## 2016-11-04 ENCOUNTER — Other Ambulatory Visit: Payer: Self-pay | Admitting: Acute Care

## 2016-11-04 ENCOUNTER — Telehealth: Payer: Self-pay | Admitting: Acute Care

## 2016-11-04 DIAGNOSIS — R9389 Abnormal findings on diagnostic imaging of other specified body structures: Secondary | ICD-10-CM

## 2016-11-04 NOTE — Telephone Encounter (Signed)
I have called Mrs. Kendrick Fries with the results of her low-dose screening CT. I explained that her scan was read as a Lung RADS 4 B indicates suspicious findings for which additional diagnostic testing and or tissue sampling is recommended. I told her that I had discussed her scan results with Dr. Lamonte Sakai. I explained that there was an 11.3 mm nodule in her left lower lobe . I explained that we needed to do some follow-up diagnostic testing to evaluate this area. I told her that we would order a PET scan, followed by a consultation with one of the pulmonologists in the office to evaluate results, and determine best next steps for plan of care. I explained some oh call her to get her PET scan scheduled, and to let her know when her follow-up appointment will be . She verbalized understanding of the above and had no further questions at completion of the call.  Langley Gauss, please fax results to Dr. Tula Nakayama and let her know we have notified the patient, we have ordered the PET scan, and that we will schedule a follow-up with pulmonary. Please let her know to contact the office if she has any questions regarding these findings. Thank you so much

## 2016-11-04 NOTE — Telephone Encounter (Signed)
Kelly Douglas, I placed an order for Kelly Douglas to have a PET scan. She will need a consultation with one of the pulmonary physicians to review the results of the PET scan. Please watch for PET results, and when available please schedule with one of pulmonary MDs for consult. Dr. Lamonte Sakai has seen her scan. If he has availability or is willing to double book he may be a good option. Thank you

## 2016-11-07 ENCOUNTER — Encounter (HOSPITAL_COMMUNITY): Payer: Self-pay

## 2016-11-07 ENCOUNTER — Ambulatory Visit (HOSPITAL_COMMUNITY): Payer: Medicare HMO

## 2016-11-07 DIAGNOSIS — M25612 Stiffness of left shoulder, not elsewhere classified: Secondary | ICD-10-CM | POA: Diagnosis not present

## 2016-11-07 DIAGNOSIS — G8929 Other chronic pain: Secondary | ICD-10-CM | POA: Diagnosis not present

## 2016-11-07 DIAGNOSIS — M25512 Pain in left shoulder: Secondary | ICD-10-CM

## 2016-11-07 DIAGNOSIS — R29898 Other symptoms and signs involving the musculoskeletal system: Secondary | ICD-10-CM | POA: Diagnosis not present

## 2016-11-07 NOTE — Telephone Encounter (Signed)
Pt scheduled with Dr Lamonte Sakai 11/18/16 3:30 after EPT scan on 11/17/16.  Pt is aware of appts.  Nothing further needed.

## 2016-11-07 NOTE — Patient Instructions (Addendum)
Repeat all exercises 10-15 times, 1-2 times per day.  1) Shoulder Protraction    Begin with elbows by your side, slowly "punch" straight out in front of you.      2) Shoulder Flexion  Standing:         Begin with arms at your side with thumbs pointed up, slowly raise both arms up and forward towards overhead.         3) Horizontal abduction/adduction   Standing:           Begin with arms straight out in front of you, bring out to the side in at "T" shape. Keep arms straight entire time.       4) Internal & External Rotation    *No band* -Stand with elbows at the side and elbows bent 90 degrees. Move your forearms away from your body, then bring back inward toward the body.     5) Shoulder Abduction  Standing:       Lying on your back begin with your arms flat on the table next to your side. Slowly move your arms out to the side so that they go overhead, in a jumping jack or snow angel movement.

## 2016-11-07 NOTE — Therapy (Signed)
[facility] [place], [place], [address] Phone: [phone]   Fax:  [phone]  Occupational Therapy Treatment  Patient Details  Name: Kelly Douglas MRN: [id number] Date of Birth: 1955/09/12 Referring Provider: Arther Abbott. MD  Encounter Date: 11/07/2016      OT End of Session - 11/07/16 1311    Visit Number 2   Number of Visits 8   Date for OT Re-Evaluation 12/02/16   Authorization Type [employer]   Authorization Time Period before 10th visit   Authorization - Visit Number 2   Authorization - Number of Visits 10   OT Start Time 1120   OT Stop Time 1200   OT Time Calculation (min) 40 min   Activity Tolerance Patient tolerated treatment well   Behavior During Therapy WFL for tasks assessed/performed      Past Medical History:  Diagnosis Date  . Anxiety   . Arteriosclerotic cardiovascular disease (ASCVD) 2013   coronary calcification-left main, LAD and CX; small pericardial effusion  . Arthritis   . Chronic back pain   . Chronic bronchitis   . Chronic lung disease    Chronic scarring and volume loss-left lung; characteristics of a chronic infectious process-possible MAI  . Depression   . GERD (gastroesophageal reflux disease)    Dr Gala Romney EGD 09/2009->esophagitis, sm HH, antral erosions, atonic esophagus  . Hyperlipidemia   . Hypothyroid 1981 approx  . Scleroderma ([place])   . Seasonal allergies   . Syncope    Multiple spells over the past 40+ years, likely neurocardiogenic  . Tobacco abuse 06/24/2009    Past Surgical History:  Procedure Laterality Date  . BILATERAL SALPINGOOPHORECTOMY  2013    Dr. Elonda Husky; uterus remains in situ  . COLONOSCOPY  09/28/09   anal papilla otherwise normal  . COLONOSCOPY N/A 08/29/2012   [id number] polyp-removed as described above. tubular adenoma  . ESOPHAGOGASTRODUODENOSCOPY  10/07/09   Dr. Barrie Dunker of esophageal mucosa, diffusely ?esophagitis (bx benign),  small HH/antral erosions, erythema bx benign. 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 ESOPHAGOGASTRODUODENOSCOPY (EGD) WITH ESOPHAGEAL DILATION N/A 03/07/2012   [id number], patent, tubular esophagus of uncertain significance-status post biopsy )unremarkable). Hiatal hernia  . LAPAROSCOPIC APPENDECTOMY  07/16/2011   Procedure: APPENDECTOMY LAPAROSCOPIC;  Surgeon: Donato Heinz, MD;  Location: [facility];  Service: General;  Laterality: N/A;  . TUBAL LIGATION      There were no vitals filed for this visit.      Subjective Assessment - 11/07/16 1123    Subjective  S: I've been working on it.    Currently in Pain? Yes   Pain Score 7    Pain Location Shoulder   Pain Orientation Left   Pain Descriptors / Indicators Constant;Aching;Nagging   Pain Type Chronic pain            [facility] OT Assessment - 11/07/16 1140      Assessment   Diagnosis left shoulder pain     Precautions   Precautions None                  OT Treatments/Exercises (OP) - 11/07/16 1140      Exercises   Exercises Shoulder     Shoulder Exercises: Supine   Protraction AROM;10 reps   Horizontal ABduction AROM;10 reps   External Rotation AROM;10 reps   Internal Rotation AROM;10 reps   Flexion AROM;10 reps   ABduction AROM;10 reps     Shoulder Exercises: Standing   Protraction AROM;10 reps  Horizontal ABduction AROM;10 reps   External Rotation AROM;10 reps   Internal Rotation AROM;10 reps   Flexion AROM;10 reps   ABduction AROM;10 reps     Manual Therapy   Manual Therapy Myofascial release   Manual therapy comments Manual therapy completed prior to exercises.    Myofascial Release Myofascial release and manual stretching completed to left upper arm, trapezius, and scapularis region to decrease fascial restrictions and increase joint mobility in a pain free zone.                OT Education - 11/07/16 1146    Education provided Yes   Education Details A/ROM shoulder exercises    Person(s) Educated Patient   Methods Explanation;Demonstration;Verbal cues;Handout   Comprehension Returned demonstration;Verbalized understanding          OT Short Term Goals - 11/07/16 1319      OT SHORT TERM GOAL #1   Title Patient will be educated and independent with HEP to increase functional use of LUE during daily tasks with decreased pain.   Time 4   Period Weeks   Status On-going     OT SHORT TERM GOAL #2   Title Patient will increase A/ROM of her LUE to WNL to increase ability to complete grooming tasks such fixing her hair.   Time 4   Period Weeks   Status On-going     OT SHORT TERM GOAL #3   Title Patient will increase LUE strength to 4/5 to increase ability to return to completing normal housekeeping tasks with increased comfort.    Time 4   Period Weeks   Status On-going     OT SHORT TERM GOAL #4   Title Patient will decrease fascial restrictions to min amount or less to increase functional mobility of LUE.    Time 4   Period Weeks   Status On-going     OT SHORT TERM GOAL #5   Title Patient will decrease pain level to 2/10 or less when completing daily tasks.    Time 4   Period Weeks   Status On-going                  Plan - 11/07/16 1311    Clinical Impression Statement A: Initiated myofascial release, manual stretching, and A/ROM exercises. Pt's ROM has greatly improved from initial evaluation. progressed to A/ROM exercises and HEP was updated. VC for form and technique.   Plan P: Follow up on HEP. Add scapular theraband exercises.       Patient will benefit from skilled therapeutic intervention in order to improve the following deficits and impairments:  Decreased strength, Pain, Decreased range of motion, Increased fascial restricitons, Impaired UE functional use  Visit Diagnosis: Other symptoms and signs involving the musculoskeletal system  Stiffness of left shoulder, not elsewhere classified  Chronic left shoulder  pain    Problem List Patient Active Problem List   Diagnosis Date Noted  . Bursitis of deltoid, left 07/12/2016  . Pollen allergies 05/19/2015  . Nicotine dependence 09/17/2013  . Cervical pain (neck) 06/21/2013  . Back pain with radiation 06/21/2013  . IDA (iron deficiency anemia) 08/10/2012  . Leg cramps 04/25/2012  . Essential hypertension 02/22/2012  . Cardiorenal syndrome 02/02/2012  . Insomnia 05/09/2011  . GERD (gastroesophageal reflux disease)   . Scleroderma ([place])   . Chronic lung disease   . Hypothyroid   . Arteriosclerotic cardiovascular disease (ASCVD)   . Syncope   . Anxiety and depression 06/24/2009  .  Vitamin D deficiency 03/18/2009  . Hyperlipidemia 03/18/2009   Ailene Ravel, OTR/L,CBIS  [phone]  11/07/2016, 1:19 PM  [facility] [address] [place], [place], [address] Phone: [phone]   Fax:  [phone]  Name: Kelly Douglas MRN: [id number] Date of Birth: May 08, 1955

## 2016-11-10 ENCOUNTER — Ambulatory Visit (HOSPITAL_COMMUNITY): Payer: Medicare HMO

## 2016-11-10 ENCOUNTER — Telehealth (HOSPITAL_COMMUNITY): Payer: Self-pay

## 2016-11-10 NOTE — Telephone Encounter (Signed)
She is sick today and can not come in

## 2016-11-16 ENCOUNTER — Ambulatory Visit (HOSPITAL_COMMUNITY): Payer: Medicare HMO | Admitting: Occupational Therapy

## 2016-11-16 DIAGNOSIS — M25512 Pain in left shoulder: Secondary | ICD-10-CM

## 2016-11-16 DIAGNOSIS — R29898 Other symptoms and signs involving the musculoskeletal system: Secondary | ICD-10-CM

## 2016-11-16 DIAGNOSIS — G8929 Other chronic pain: Secondary | ICD-10-CM | POA: Diagnosis not present

## 2016-11-16 DIAGNOSIS — M25612 Stiffness of left shoulder, not elsewhere classified: Secondary | ICD-10-CM

## 2016-11-16 NOTE — Therapy (Signed)
[facility] [place], [place], [address] Phone: [phone]   Fax:  [phone]  Occupational Therapy Treatment  Patient Details  Name: Kelly Douglas MRN: [id number] Date of Birth: [birth year] Referring Provider: Arther Abbott. MD  Encounter Date: 11/16/2016      OT End of Session - 11/16/16 1557    Visit Number 3   Number of Visits 8   Date for OT Re-Evaluation 12/02/16   Authorization Type [employer]   Authorization Time Period before 10th visit   Authorization - Visit Number 3   Authorization - Number of Visits 10   OT Start Time 1520   OT Stop Time 1600   OT Time Calculation (min) 40 min   Activity Tolerance Patient tolerated treatment well   Behavior During Therapy WFL for tasks assessed/performed      Past Medical History:  Diagnosis Date  . Anxiety   . Arteriosclerotic cardiovascular disease (ASCVD) 2013   coronary calcification-left main, LAD and CX; small pericardial effusion  . Arthritis   . Chronic back pain   . Chronic bronchitis   . Chronic lung disease    Chronic scarring and volume loss-left lung; characteristics of a chronic infectious process-possible MAI  . Depression   . GERD (gastroesophageal reflux disease)    Dr Gala Romney EGD 09/2009->esophagitis, sm HH, antral erosions, atonic esophagus  . Hyperlipidemia   . Hypothyroid 1981 approx  . Scleroderma ([place])   . Seasonal allergies   . Syncope    Multiple spells over the past 40+ years, likely neurocardiogenic  . Tobacco abuse 06/24/2009    Past Surgical History:  Procedure Laterality Date  . BILATERAL SALPINGOOPHORECTOMY  2013    Dr. Elonda Husky; uterus remains in situ  . COLONOSCOPY  09/28/09   anal papilla otherwise normal  . COLONOSCOPY N/A 08/29/2012   JHE:[id number] polyp-removed as described above. tubular adenoma  . ESOPHAGOGASTRODUODENOSCOPY  10/07/09   Dr. Barrie Dunker of esophageal mucosa, diffusely ?esophagitis (bx benign),  small HH/antral erosions, erythema bx benign. 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 ESOPHAGOGASTRODUODENOSCOPY (EGD) WITH ESOPHAGEAL DILATION N/A 03/07/2012   GYJ:[id number], patent, tubular esophagus of uncertain significance-status post biopsy )unremarkable). Hiatal hernia  . LAPAROSCOPIC APPENDECTOMY  07/16/2011   Procedure: APPENDECTOMY LAPAROSCOPIC;  Surgeon: Donato Heinz, MD;  Location: [facility];  Service: General;  Laterality: N/A;  . TUBAL LIGATION      There were no vitals filed for this visit.      Subjective Assessment - 11/16/16 1522    Subjective  S: I was able to brush my hair this morning.    Currently in Pain? Yes   Pain Score 5    Pain Location Shoulder   Pain Orientation Left   Pain Descriptors / Indicators Constant;Aching   Pain Type Chronic pain   Pain Radiating Towards n/a   Pain Onset More than a month ago   Pain Frequency Constant   Aggravating Factors  movement and use   Pain Relieving Factors pain medication   Effect of Pain on Daily Activities severe effect on ADL completion   Multiple Pain Sites No            [facility] OT Assessment - 11/16/16 1515      Assessment   Diagnosis left shoulder pain     Precautions   Precautions None                  OT Treatments/Exercises (OP) - 11/16/16 1523  Exercises   Exercises Shoulder     Shoulder Exercises: Supine   Protraction PROM;5 reps;AROM;12 reps   Horizontal ABduction PROM;5 reps;AROM;12 reps   External Rotation PROM;5 reps;AROM;12 reps   Internal Rotation PROM;5 reps;AROM;12 reps   Flexion PROM;5 reps;AROM;12 reps   ABduction PROM;5 reps;AROM;12 reps     Shoulder Exercises: Standing   Protraction AROM;12 reps   Horizontal ABduction AROM;12 reps   External Rotation AROM;12 reps   Internal Rotation AROM;12 reps   Flexion AROM;12 reps   ABduction AROM;12 reps   Extension Theraband;10 reps   Theraband Level (Shoulder Extension) Level 2 (Red)   Row Theraband;10 reps    Theraband Level (Shoulder Row) Level 2 (Red)   Retraction Theraband;10 reps   Theraband Level (Shoulder Retraction) Level 2 (Red)     Shoulder Exercises: ROM/Strengthening   UBE (Upper Arm Bike) Level 1 2' forward 2' reverse   Wall Wash 2'   Over Head Lace 2'   X to V Arms 10X   Proximal Shoulder Strengthening, Supine 10X each no rest breaks   Proximal Shoulder Strengthening, Seated 10X each no rest breaks     Manual Therapy   Manual Therapy Myofascial release   Manual therapy comments Manual therapy completed prior to exercises.    Myofascial Release Myofascial release and manual stretching completed to left upper arm, trapezius, and scapularis region to decrease fascial restrictions and increase joint mobility in a pain free zone.                  OT Short Term Goals - 11/07/16 1319      OT SHORT TERM GOAL #1   Title Patient will be educated and independent with HEP to increase functional use of LUE during daily tasks with decreased pain.   Time 4   Period Weeks   Status On-going     OT SHORT TERM GOAL #2   Title Patient will increase A/ROM of her LUE to WNL to increase ability to complete grooming tasks such fixing her hair.   Time 4   Period Weeks   Status On-going     OT SHORT TERM GOAL #3   Title Patient will increase LUE strength to 4/5 to increase ability to return to completing normal housekeeping tasks with increased comfort.    Time 4   Period Weeks   Status On-going     OT SHORT TERM GOAL #4   Title Patient will decrease fascial restrictions to min amount or less to increase functional mobility of LUE.    Time 4   Period Weeks   Status On-going     OT SHORT TERM GOAL #5   Title Patient will decrease pain level to 2/10 or less when completing daily tasks.    Time 4   Period Weeks   Status On-going                  Plan - 11/16/16 1556    Clinical Impression Statement A: Continued with A/ROM, pt requiring cuing for form and speed.  Added scapular theraband, wall wash, overhead lacing, and UBE this session, min fatigue during exercises. Pt reports she is completing HEP with no difficulty.    Plan P: Continue with scapular theraband working on improving independence with form. Add sidelying exercises for scapular strengthening   OT Home Exercise Plan 10/17: AA/ROM Shoulder exercises.    Consulted and Agree with Plan of Care Patient      Patient will benefit from skilled  therapeutic intervention in order to improve the following deficits and impairments:  Decreased strength, Pain, Decreased range of motion, Increased fascial restricitons, Impaired UE functional use  Visit Diagnosis: Other symptoms and signs involving the musculoskeletal system  Stiffness of left shoulder, not elsewhere classified  Chronic left shoulder pain    Problem List Patient Active Problem List   Diagnosis Date Noted  . Bursitis of deltoid, left 07/12/2016  . Pollen allergies 05/19/2015  . Nicotine dependence 09/17/2013  . Cervical pain (neck) 06/21/2013  . Back pain with radiation 06/21/2013  . IDA (iron deficiency anemia) 08/10/2012  . Leg cramps 04/25/2012  . Essential hypertension 02/22/2012  . Cardiorenal syndrome 02/02/2012  . Insomnia 05/09/2011  . GERD (gastroesophageal reflux disease)   . Scleroderma ([place])   . Chronic lung disease   . Hypothyroid   . Arteriosclerotic cardiovascular disease (ASCVD)   . Syncope   . Anxiety and depression 06/24/2009  . Vitamin D deficiency 03/18/2009  . Hyperlipidemia 03/18/2009   Guadelupe Sabin, OTR/L  [phone] 11/16/2016, 4:01 PM  [facility] [address] [place], [place], [address] Phone: [phone]   Fax:  [phone]  Name: REY FORS MRN: [id number] Date of Birth: May 15, 1955

## 2016-11-17 ENCOUNTER — Ambulatory Visit (HOSPITAL_COMMUNITY)
Admission: RE | Admit: 2016-11-17 | Discharge: 2016-11-17 | Disposition: A | Discharge status: Home / Self Care | Payer: Medicare HMO | Source: Ambulatory Visit | Attending: Acute Care | Admitting: Acute Care

## 2016-11-17 DIAGNOSIS — R9389 Abnormal findings on diagnostic imaging of other specified body structures: Secondary | ICD-10-CM

## 2016-11-17 DIAGNOSIS — R911 Solitary pulmonary nodule: Secondary | ICD-10-CM | POA: Insufficient documentation

## 2016-11-17 DIAGNOSIS — Z01812 Encounter for preprocedural laboratory examination: Secondary | ICD-10-CM | POA: Diagnosis not present

## 2016-11-17 DIAGNOSIS — I700 Atherosclerosis of aorta: Secondary | ICD-10-CM | POA: Diagnosis not present

## 2016-11-17 LAB — GLUCOSE, CAPILLARY: Glucose-Capillary: 86 mg/dL (ref 65–99)

## 2016-11-17 MED ORDER — FLUDEOXYGLUCOSE F - 18 (FDG) INJECTION
7.9000 | Freq: Once | INTRAVENOUS | Status: AC | PRN
  Administered 2016-11-17: 7.9 via INTRAVENOUS

## 2016-11-18 ENCOUNTER — Encounter: Payer: Self-pay | Admitting: Emergency Medicine

## 2016-11-18 ENCOUNTER — Encounter (HOSPITAL_COMMUNITY): Payer: Self-pay | Admitting: Occupational Therapy

## 2016-11-18 ENCOUNTER — Ambulatory Visit (HOSPITAL_COMMUNITY): Payer: Medicare HMO | Attending: Orthopedic Surgery | Admitting: Occupational Therapy

## 2016-11-18 ENCOUNTER — Ambulatory Visit (INDEPENDENT_AMBULATORY_CARE_PROVIDER_SITE_OTHER): Payer: Medicare HMO | Admitting: Emergency Medicine

## 2016-11-18 VITALS — BP 128/70 | HR 87 | Ht 68.0 in | Wt 162.0 lb

## 2016-11-18 DIAGNOSIS — F17218 Nicotine dependence, cigarettes, with other nicotine-induced disorders: Secondary | ICD-10-CM

## 2016-11-18 DIAGNOSIS — R918 Other nonspecific abnormal finding of lung field: Secondary | ICD-10-CM | POA: Diagnosis not present

## 2016-11-18 DIAGNOSIS — G8929 Other chronic pain: Secondary | ICD-10-CM | POA: Insufficient documentation

## 2016-11-18 DIAGNOSIS — R911 Solitary pulmonary nodule: Secondary | ICD-10-CM

## 2016-11-18 DIAGNOSIS — R29898 Other symptoms and signs involving the musculoskeletal system: Secondary | ICD-10-CM | POA: Diagnosis not present

## 2016-11-18 DIAGNOSIS — C3492 Malignant neoplasm of unspecified part of left bronchus or lung: Secondary | ICD-10-CM | POA: Insufficient documentation

## 2016-11-18 DIAGNOSIS — M25612 Stiffness of left shoulder, not elsewhere classified: Secondary | ICD-10-CM | POA: Diagnosis not present

## 2016-11-18 DIAGNOSIS — M25512 Pain in left shoulder: Secondary | ICD-10-CM | POA: Insufficient documentation

## 2016-11-18 NOTE — Assessment & Plan Note (Signed)
Left apical scar and multiple pulmonary nodules that appear to be stable by serial scans.  Her mid left lower lobe nodule is slightly larger, now greater than 11 mm.  PET scan does not show significant hypermetabolic activity.  Still some concern that this could be a slow-growing adenocarcinoma.  I believe she needs serial scans and follow-up.  We will consider bronchoscopy and biopsy if the nodule is growing.  Alternative she may be a candidate for primary resection if the other nodules are stable.

## 2016-11-18 NOTE — Therapy (Signed)
[facility] [place], [place], [address] Phone: [phone]   Fax:  [phone]  Occupational Therapy Treatment  Patient Details  Name: TECORA Douglas MRN: [id number] Date of Birth: January 08, 1956 Referring Provider: Arther Abbott. MD  Encounter Date: 11/18/2016      OT End of Session - 11/18/16 1112    Visit Number 4   Number of Visits 8   Date for OT Re-Evaluation 12/02/16   Authorization Type [employer]   Authorization Time Period before 10th visit   Authorization - Visit Number 4   Authorization - Number of Visits 10   OT Start Time 1448   OT Stop Time 1112   OT Time Calculation (min) 39 min   Activity Tolerance Patient tolerated treatment well   Behavior During Therapy WFL for tasks assessed/performed      Past Medical History:  Diagnosis Date  . Anxiety   . Arteriosclerotic cardiovascular disease (ASCVD) 2013   coronary calcification-left main, LAD and CX; small pericardial effusion  . Arthritis   . Chronic back pain   . Chronic bronchitis   . Chronic lung disease    Chronic scarring and volume loss-left lung; characteristics of a chronic infectious process-possible MAI  . Depression   . GERD (gastroesophageal reflux disease)    Dr Gala Romney EGD 09/2009->esophagitis, sm HH, antral erosions, atonic esophagus  . Hyperlipidemia   . Hypothyroid 1981 approx  . Scleroderma ([place])   . Seasonal allergies   . Syncope    Multiple spells over the past 40+ years, likely neurocardiogenic  . Tobacco abuse 06/24/2009    Past Surgical History:  Procedure Laterality Date  . BILATERAL SALPINGOOPHORECTOMY  2013    Dr. Elonda Husky; uterus remains in situ  . COLONOSCOPY  09/28/09   anal papilla otherwise normal  . COLONOSCOPY N/A 08/29/2012   [id number] polyp-removed as described above. tubular adenoma  . ESOPHAGOGASTRODUODENOSCOPY  10/07/09   Dr. Barrie Dunker of esophageal mucosa, diffusely ?esophagitis (bx benign),  small HH/antral erosions, erythema bx benign. 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 ESOPHAGOGASTRODUODENOSCOPY (EGD) WITH ESOPHAGEAL DILATION N/A 03/07/2012   YOV:[id number], patent, tubular esophagus of uncertain significance-status post biopsy )unremarkable). Hiatal hernia  . LAPAROSCOPIC APPENDECTOMY  07/16/2011   Procedure: APPENDECTOMY LAPAROSCOPIC;  Surgeon: Donato Heinz, MD;  Location: [facility];  Service: General;  Laterality: N/A;  . TUBAL LIGATION      There were no vitals filed for this visit.      Subjective Assessment - 11/18/16 1032    Subjective  S: I had a PET scan yesterday and I had to hold my arm up.    Currently in Pain? Yes   Pain Score 6    Pain Location Shoulder   Pain Orientation Left   Pain Descriptors / Indicators Aching;Constant   Pain Type Chronic pain   Pain Radiating Towards n/a   Pain Onset More than a month ago   Pain Frequency Constant   Aggravating Factors  movement and use   Pain Relieving Factors pain medication   Effect of Pain on Daily Activities severe effect on ADL completion   Multiple Pain Sites No            [facility] OT Assessment - 11/18/16 1031      Assessment   Diagnosis left shoulder pain     Precautions   Precautions None                  OT Treatments/Exercises (OP) - 11/18/16  1036      Exercises   Exercises Shoulder     Shoulder Exercises: Supine   Protraction PROM;5 reps;AROM;12 reps   Horizontal ABduction PROM;5 reps;AROM;12 reps   External Rotation PROM;5 reps;AROM;12 reps   Internal Rotation PROM;5 reps;AROM;12 reps   Flexion PROM;5 reps;AROM;12 reps   ABduction PROM;5 reps;AROM;12 reps     Shoulder Exercises: Sidelying   External Rotation AROM;10 reps   Internal Rotation AROM;10 reps   Flexion AROM;10 reps   ABduction AROM;10 reps   Other Sidelying Exercises protraction, A/ROM, 10X   Other Sidelying Exercises horizontal abduction, A/ROM, 10X     Shoulder Exercises: Standing   Protraction  AROM;12 reps   Horizontal ABduction AROM;12 reps   External Rotation AROM;12 reps   Internal Rotation AROM;12 reps   Flexion AROM;12 reps   ABduction AROM;12 reps   Extension Theraband;10 reps   Theraband Level (Shoulder Extension) Level 2 (Red)   Row Theraband;10 reps   Theraband Level (Shoulder Row) Level 2 (Red)   Retraction Theraband;10 reps   Theraband Level (Shoulder Retraction) Level 2 (Red)     Shoulder Exercises: ROM/Strengthening   X to V Arms 12X   Proximal Shoulder Strengthening, Supine 10X each no rest breaks   Proximal Shoulder Strengthening, Seated 10X each no rest breaks     Manual Therapy   Manual Therapy Myofascial release   Manual therapy comments Manual therapy completed prior to exercises.    Myofascial Release Myofascial release and manual stretching completed to left upper arm, trapezius, and scapularis region to decrease fascial restrictions and increase joint mobility in a pain free zone.                  OT Short Term Goals - 11/07/16 1319      OT SHORT TERM GOAL #1   Title Patient will be educated and independent with HEP to increase functional use of LUE during daily tasks with decreased pain.   Time 4   Period Weeks   Status On-going     OT SHORT TERM GOAL #2   Title Patient will increase A/ROM of her LUE to WNL to increase ability to complete grooming tasks such fixing her hair.   Time 4   Period Weeks   Status On-going     OT SHORT TERM GOAL #3   Title Patient will increase LUE strength to 4/5 to increase ability to return to completing normal housekeeping tasks with increased comfort.    Time 4   Period Weeks   Status On-going     OT SHORT TERM GOAL #4   Title Patient will decrease fascial restrictions to min amount or less to increase functional mobility of LUE.    Time 4   Period Weeks   Status On-going     OT SHORT TERM GOAL #5   Title Patient will decrease pain level to 2/10 or less when completing daily tasks.    Time  4   Period Weeks   Status On-going                  Plan - 11/18/16 1112    Clinical Impression Statement A: Continued with A/ROM, adding A/ROM in sidelying with verbal cuing for form and technique. Continued with scapular theraband, improvement in form noted this session. Pt with moderate fatigue at end of session, rest breaks provided during session as needed. Pt has P/ROM and A/ROM WNL today.    Plan P: Add 1# weight in supine, increase  standing A/ROM repetitions to 15    Consulted and Agree with Plan of Care Patient      Patient will benefit from skilled therapeutic intervention in order to improve the following deficits and impairments:  Decreased strength, Pain, Decreased range of motion, Increased fascial restricitons, Impaired UE functional use  Visit Diagnosis: Other symptoms and signs involving the musculoskeletal system  Stiffness of left shoulder, not elsewhere classified  Chronic left shoulder pain    Problem List Patient Active Problem List   Diagnosis Date Noted  . Bursitis of deltoid, left 07/12/2016  . Pollen allergies 05/19/2015  . Nicotine dependence 09/17/2013  . Cervical pain (neck) 06/21/2013  . Back pain with radiation 06/21/2013  . IDA (iron deficiency anemia) 08/10/2012  . Leg cramps 04/25/2012  . Essential hypertension 02/22/2012  . Cardiorenal syndrome 02/02/2012  . Insomnia 05/09/2011  . GERD (gastroesophageal reflux disease)   . Scleroderma ([place])   . Chronic lung disease   . Hypothyroid   . Arteriosclerotic cardiovascular disease (ASCVD)   . Syncope   . Anxiety and depression 06/24/2009  . Vitamin D deficiency 03/18/2009  . Hyperlipidemia 03/18/2009   Guadelupe Sabin, OTR/L  [phone] 11/18/2016, 11:14 AM  [facility] [address] [place], [place], [address] Phone: [phone]   Fax:  [phone]  Name: LAKEYN DOKKEN MRN: [id number] Date of Birth: 03/21/1955

## 2016-11-18 NOTE — Patient Instructions (Addendum)
We will repeat your CT scan of the chest in 6 months to follow your pulmonary nodule for interval change.  Keep albuterol available to use 2 puffs if needed for shortness of breath Continue to work hard on decreasing your cigarettes.  Follow with Dr Lamonte Sakai in 6 months after your CT scan to review the results.

## 2016-11-18 NOTE — Progress Notes (Signed)
Subjective:    Patient ID: Kelly Douglas, female    DOB: Jul 26, 1955, 61 y.o.   MRN: [id number]  HPI 61 year old smoker (45+ pack years) with a history of scleroderma, coronary artery disease, chronic abnormalities on CT scan of the chest that have been characterized by scarring and possible mycobacterial disease.  She has undergone low-dose CT scan lung cancer screening, most recent on 11/01/16.  This study showed overall stability of most of her pulmonary nodular disease but with some interval increase in size of an 11.3 mm irregular central left lower lobe nodule.  This finding prompted a PET scan that was done on 11/17/16 that I have reviewed.  This study shows minimal but discernible FDG uptake in the nodule in question (SUV 1.5). She has been on prednisone before, not currently, last was probably 6 months ago. She has cut down to 1/2 a pack a day. Denies significant cough, chest pain, sputum. No hemoptysis.     Review of Systems  HENT: Positive for dental problem, nosebleeds and sinus pressure.   Respiratory: Positive for shortness of breath.   Allergic/Immunologic: Negative.   Psychiatric/Behavioral: The patient is nervous/anxious.    Past Medical History:  Diagnosis Date  . Anxiety   . Arteriosclerotic cardiovascular disease (ASCVD) 2013   coronary calcification-left main, LAD and CX; small pericardial effusion  . Arthritis   . Chronic back pain   . Chronic bronchitis   . Chronic lung disease    Chronic scarring and volume loss-left lung; characteristics of a chronic infectious process-possible MAI  . Depression   . GERD (gastroesophageal reflux disease)    Dr Gala Romney EGD 09/2009->esophagitis, sm HH, antral erosions, atonic esophagus  . Hyperlipidemia   . Hypothyroid 1981 approx  . Scleroderma ([place])   . Seasonal allergies   . Syncope    Multiple spells over the past 40+ years, likely neurocardiogenic  . Tobacco abuse 06/24/2009     Family History  Problem Relation Age of  Onset  . Stroke Father   . Aneurysm Father   . Coronary artery disease Brother   . Hypertension Brother   . Down syndrome Brother   . Hypertension Brother   . Hypertension Brother        father/mother  . Aneurysm Mother   . Throat cancer Brother 49       throat cancer  . Hypertension Sister   . Diabetes Brother   . Diabetes Brother   . Anesthesia problems Neg Hx   . Hypotension Neg Hx   . Malignant hyperthermia Neg Hx   . Pseudochol deficiency Neg Hx   . Colon cancer Neg Hx      Social History   Social History  . Marital status: Married    Spouse name: N/A  . Number of children: 2  . Years of education: N/A   Occupational History  . disabled Not Employed   Social History Main Topics  . Smoking status: Current Every Day Smoker    Packs/day: 0.50    Years: 45.00    Types: Cigarettes    Start date: 07/14/1971  . Smokeless tobacco: Never Used     Comment: 0.5 PPD current plans to reduce   . Alcohol use No     Comment: quit June 2012-used to drink 6 pack daily  . Drug use: No  . Sexual activity: Yes    Birth control/ protection: Post-menopausal   Other Topics Concern  . Not on file   Social History Narrative  .  No narrative on file     Allergies  Allergen Reactions  . Aspirin Other (See Comments)    Nose bleeds     Outpatient Medications Prior to Visit  Medication Sig Dispense Refill  . Acetaminophen (TYLENOL ARTHRITIS PAIN PO) Take 2 tablets by mouth 3 (three) times daily.    Marland Kitchen albuterol (PROVENTIL HFA;VENTOLIN HFA) 108 (90 BASE) MCG/ACT inhaler Inhale 2 puffs into the lungs every 6 (six) hours as needed. Shortness of breath 6.7 g 3  . amLODipine (NORVASC) 5 MG tablet TAKE 1 TABLET EVERY DAY 90 tablet 1  . busPIRone (BUSPAR) 10 MG tablet Take 1 tablet (10 mg total) by mouth 3 (three) times daily. 270 tablet 1  . cholestyramine (QUESTRAN) 4 g packet MIX 1/2 PACKET AND DRINK DAILY. MAY INCREASE TO 1 PACKET DAILY. DO NOT TAKE WITHIN 2 HOURS OF OTHER  MEDICATIONS 90 packet 3  . dicyclomine (BENTYL) 10 MG capsule TAKE 1 CAPSULE THREE TIMES DAILY WITH MEALS AS NEEDED FOR  SPASMS 120 capsule 3  . DULoxetine (CYMBALTA) 60 MG capsule TAKE 1 CAPSULE EVERY DAY 90 capsule 1  . gabapentin (NEURONTIN) 600 MG tablet TAKE 1 TABLET TWICE DAILY  (DOSE  REDUCTION) 180 tablet 3  . levothyroxine (SYNTHROID, LEVOTHROID) 75 MCG tablet TAKE 1 TABLET EVERY DAY 90 tablet 1  . meloxicam (MOBIC) 7.5 MG tablet TAKE 1 TABLET(7.5 MG) BY MOUTH DAILY 90 tablet 1  . montelukast (SINGULAIR) 10 MG tablet TAKE 1 TABLET AT BEDTIME 90 tablet 1  . Multiple Vitamins-Calcium (ONE-A-DAY WOMENS PO) Take 1 tablet by mouth daily.    . pantoprazole (PROTONIX) 40 MG tablet TAKE 1 TABLET TWICE DAILY 180 tablet 5  . pravastatin (PRAVACHOL) 40 MG tablet TAKE 1 TABLET EVERY DAY 90 tablet 1  . predniSONE (DELTASONE) 10 MG tablet Take 1 tablet (10 mg total) by mouth daily. 14 tablet 0  . temazepam (RESTORIL) 30 MG capsule TAKE 1 CAPSULE AT BEDTIME 90 capsule 1  . traMADol (ULTRAM) 50 MG tablet Take 1 tablet (50 mg total) by mouth every 6 (six) hours as needed. 60 tablet 5  . traMADol-acetaminophen (ULTRACET) 37.5-325 MG tablet Take 1 tablet by mouth every 4 (four) hours as needed. 30 tablet 5  . buPROPion (WELLBUTRIN SR) 150 MG 12 hr tablet Take one tablet once daily for the first three days , then one tablet twice daily Qui5t smoking 2 weeks after starting medication 180 tablet 0   No facility-administered medications prior to visit.         Objective:   Physical Exam Vitals:   11/18/16 1521  BP: 128/70  Pulse: 87  SpO2: 97%  Weight: 162 lb (73.5 kg)  Height: 5\' 8"  (1.727 m)   Gen: Pleasant, well-nourished, in no distress,  normal affect  ENT: No lesions,  mouth clear,  oropharynx clear, no postnasal drip  Neck: No JVD, no stridor  Lungs: No use of accessory muscles, clear without rales or rhonchi  Cardiovascular: RRR, heart sounds normal, no murmur or gallops, no  peripheral edema  Musculoskeletal: No deformities, no cyanosis or clubbing  Neuro: alert, non focal  Skin: Some sclerodactyly and hyperpigmentation changes on her bilateral hands     10/31/16 --  COMPARISON:  Low-dose lung cancer screening CT chest dated 05/08/2015  FINDINGS: Cardiovascular: The heart is normal in size. Leftward cardiomediastinal shift. No pericardial effusion.  No evidence of thoracic aortic aneurysm.  Mild coronary atherosclerosis of the LAD.  Mediastinum/Nodes: Small mediastinal lymph nodes which do not  meet pathologic CT size criteria.  Visualized thyroid is unremarkable.  Lungs/Pleura: Right lung is essentially clear, noting very mild centrilobular emphysematous changes. Minimal segmental tree-in-bud nodularity in the right lower lobe measuring up to 3.8 mm, unchanged.  Volume loss in the left hemithorax. Dominant left apical pleural-parenchymal scarring, unchanged. Mild centrilobular and paraseptal emphysematous changes.  Scattered patchy/nodular opacities in the left upper lobe, including a 5.7 cm nodular opacity anteriorly, new/ progressed. However, this appearance is likely infectious/ inflammatory. Additional scattered nodules in the left upper lobe, including two subpleural nodules measuring 7.0 and 7.5 mm which are unchanged.  10.4 mm sub solid nodular opacity in the medial left lower lobe, unchanged.  11.3 mm irregular nodular opacity in the central left lower lobe (series 3/ image 199), more conspicuous than on the prior, likely progressed.   No pleural effusion or pneumothorax.  Upper Abdomen: Visualized upper abdomen is unremarkable.  Musculoskeletal: Visualized osseous structures are within normal limits.  IMPRESSION: 11.3 mm irregular nodular opacity in the central left lower lobe. Lung-RADS 4B, suspicious. Additional imaging evaluation or consultation with Pulmonology or Thoracic Surgery  recommended.  Consider PET-CT as clinically warranted. (Note: Inflammatory nodular opacities are suspected in the left upper lobe, which may be FDG avid. However, if FDG avid, this nodule would not be considered a false-positive.)      Assessment & Plan:  Nicotine dependence Discussed smoking cessation with her today.  She understands its importance.  She is interested in quitting.  We will discussed techniques to set a quit date and succeed at her next visit.  Pulmonary nodules Left apical scar and multiple pulmonary nodules that appear to be stable by serial scans.  Her mid left lower lobe nodule is slightly larger, now greater than 11 mm.  PET scan does not show significant hypermetabolic activity.  Still some concern that this could be a slow-growing adenocarcinoma.  I believe she needs serial scans and follow-up.  We will consider bronchoscopy and biopsy if the nodule is growing.  Alternative she may be a candidate for primary resection if the other nodules are stable.  Baltazar Apo, MD, PhD 11/18/2016, 4:11 PM [facility] [phone] or if no answer [phone]

## 2016-11-18 NOTE — Assessment & Plan Note (Signed)
Discussed smoking cessation with her today.  She understands its importance.  She is interested in quitting.  We will discussed techniques to set a quit date and succeed at her next visit.

## 2016-11-21 ENCOUNTER — Encounter (HOSPITAL_COMMUNITY): Payer: Self-pay

## 2016-11-21 ENCOUNTER — Ambulatory Visit (HOSPITAL_COMMUNITY): Payer: Medicare HMO

## 2016-11-21 DIAGNOSIS — R29898 Other symptoms and signs involving the musculoskeletal system: Secondary | ICD-10-CM | POA: Diagnosis not present

## 2016-11-21 DIAGNOSIS — G8929 Other chronic pain: Secondary | ICD-10-CM | POA: Diagnosis not present

## 2016-11-21 DIAGNOSIS — M25512 Pain in left shoulder: Secondary | ICD-10-CM

## 2016-11-21 DIAGNOSIS — M25612 Stiffness of left shoulder, not elsewhere classified: Secondary | ICD-10-CM | POA: Diagnosis not present

## 2016-11-21 NOTE — Therapy (Signed)
[facility] [place], [place], [address] Phone: [phone]   Fax:  [phone]  Occupational Therapy Treatment  Patient Details  Name: Kelly Douglas MRN: [id number] Date of Birth: Nov 10, 1955 Referring Provider: Arther Abbott. MD   Encounter Date: 11/21/2016  OT End of Session - 11/21/16 1550    Visit Number  5    Number of Visits  8    Date for OT Re-Evaluation  12/02/16    Authorization Type  [employer]    Authorization Time Period  before 10th visit    Authorization - Visit Number  5    Authorization - Number of Visits  10    OT Start Time  1435    OT Stop Time  1515    OT Time Calculation (min)  40 min    Activity Tolerance  Patient tolerated treatment well    Behavior During Therapy  WFL for tasks assessed/performed       Past Medical History:  Diagnosis Date  . Anxiety   . Arteriosclerotic cardiovascular disease (ASCVD) 2013   coronary calcification-left main, LAD and CX; small pericardial effusion  . Arthritis   . Chronic back pain   . Chronic bronchitis   . Chronic lung disease    Chronic scarring and volume loss-left lung; characteristics of a chronic infectious process-possible MAI  . Depression   . GERD (gastroesophageal reflux disease)    Dr Gala Romney EGD 09/2009->esophagitis, sm HH, antral erosions, atonic esophagus  . Hyperlipidemia   . Hypothyroid 1981 approx  . Scleroderma ([place])   . Seasonal allergies   . Syncope    Multiple spells over the past 40+ years, likely neurocardiogenic  . Tobacco abuse 06/24/2009    Past Surgical History:  Procedure Laterality Date  . BILATERAL SALPINGOOPHORECTOMY  2013    Dr. Elonda Husky; uterus remains in situ  . COLONOSCOPY  09/28/09   anal papilla otherwise normal  . ESOPHAGOGASTRODUODENOSCOPY  10/07/09   Dr. Barrie Dunker of esophageal mucosa, diffusely ?esophagitis (bx benign), small HH/antral erosions, erythema bx benign. 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 TUBAL LIGATION      There were no vitals filed for this visit.  Subjective Assessment - 11/21/16 1506    Subjective   S: My pain meds don't help at all.     Currently in Pain?  Yes    Pain Score  6     Pain Location  Shoulder    Pain Orientation  Left    Pain Descriptors / Indicators  Aching;Constant    Pain Type  Chronic pain                   OT Treatments/Exercises (OP) - 11/21/16 0001      Exercises   Exercises  Shoulder      Shoulder Exercises: Supine   Protraction  PROM;5 reps;Strengthening;10 reps    Protraction Weight (lbs)  1    Horizontal ABduction  PROM;5 reps;Strengthening;10 reps    Horizontal ABduction Weight (lbs)  1    External Rotation  PROM;5 reps;Strengthening;10 reps    External Rotation Weight (lbs)  1    Internal Rotation  PROM;5 reps;Strengthening;10 reps    Internal Rotation Weight (lbs)  1    Flexion  PROM;5 reps;Strengthening;10 reps    Shoulder Flexion Weight (lbs)  1    ABduction  PROM;5 reps;AROM;12 reps    Shoulder ABduction Weight (lbs)  1      Shoulder Exercises: Standing  Protraction  Strengthening;10 reps    Protraction Weight (lbs)  1    Horizontal ABduction  Strengthening;10 reps    Horizontal ABduction Weight (lbs)  1    External Rotation  Strengthening;10 reps    External Rotation Weight (lbs)  1    Internal Rotation  Strengthening;10 reps    Internal Rotation Weight (lbs)  1    Flexion  Strengthening;10 reps    Shoulder Flexion Weight (lbs)  1    ABduction  Strengthening;10 reps    Shoulder ABduction Weight (lbs)  1    Extension  Theraband;10 reps    Theraband Level (Shoulder Extension)  Level 2 (Red)    Row  Theraband;10 reps    Theraband Level (Shoulder Row)  Level 2 (Red)    Retraction  Theraband;10 reps    Theraband Level (Shoulder Retraction)  Level 2 (Red)      Shoulder Exercises: ROM/Strengthening   UBE (Upper Arm Bike)  Level 1 2' forward 2' reverse    X to V Arms  10X with 1#     Proximal Shoulder Strengthening, Supine  10X with 1# no rest breaks    Proximal Shoulder Strengthening, Seated  12X with no rest breaks      Manual Therapy   Manual Therapy  Myofascial release    Manual therapy comments  Manual therapy completed prior to exercises.     Myofascial Release  Myofascial release and manual stretching completed to left upper arm, trapezius, and scapularis region to decrease fascial restrictions and increase joint mobility in a pain free zone.             OT Education - 11/21/16 1549    Education provided  Yes    Education Details  Discussed using water bottle for strengthening aspect of HEP.    Person(s) Educated  Patient    Methods  Explanation    Comprehension  Verbalized understanding       OT Short Term Goals - 11/07/16 1319      OT SHORT TERM GOAL #1   Title  Patient will be educated and independent with HEP to increase functional use of LUE during daily tasks with decreased pain.    Time  4    Period  Weeks    Status  On-going      OT SHORT TERM GOAL #2   Title  Patient will increase A/ROM of her LUE to WNL to increase ability to complete grooming tasks such fixing her hair.    Time  4    Period  Weeks    Status  On-going      OT SHORT TERM GOAL #3   Title  Patient will increase LUE strength to 4/5 to increase ability to return to completing normal housekeeping tasks with increased comfort.     Time  4    Period  Weeks    Status  On-going      OT SHORT TERM GOAL #4   Title  Patient will decrease fascial restrictions to min amount or less to increase functional mobility of LUE.     Time  4    Period  Weeks    Status  On-going      OT SHORT TERM GOAL #5   Title  Patient will decrease pain level to 2/10 or less when completing daily tasks.     Time  4    Period  Weeks    Status  On-going  Plan - 11/21/16 1551    Clinical Impression Statement  A: Progressed to 1# supine and standing for strengthening. Pt  was able to complete mild muscle fatigue. No increased pain noted. Pt has full passive ROM this session with min fascial restrictions noted in upper trapezius region.    Plan  P: D/C passive stretching. Prone scapular strengthening.       Patient will benefit from skilled therapeutic intervention in order to improve the following deficits and impairments:  Decreased strength, Pain, Decreased range of motion, Increased fascial restricitons, Impaired UE functional use  Visit Diagnosis: Other symptoms and signs involving the musculoskeletal system  Stiffness of left shoulder, not elsewhere classified  Chronic left shoulder pain    Problem List Patient Active Problem List   Diagnosis Date Noted  . Pulmonary nodules 11/18/2016  . Bursitis of deltoid, left 07/12/2016  . Pollen allergies 05/19/2015  . Nicotine dependence 09/17/2013  . Cervical pain (neck) 06/21/2013  . Back pain with radiation 06/21/2013  . IDA (iron deficiency anemia) 08/10/2012  . Leg cramps 04/25/2012  . Essential hypertension 02/22/2012  . Cardiorenal syndrome 02/02/2012  . Insomnia 05/09/2011  . GERD (gastroesophageal reflux disease)   . Scleroderma ([place])   . Chronic lung disease   . Hypothyroid   . Arteriosclerotic cardiovascular disease (ASCVD)   . Syncope   . Anxiety and depression 06/24/2009  . Vitamin D deficiency 03/18/2009  . Hyperlipidemia 03/18/2009    Caeden Foots, Clarene Duke 11/21/2016, 3:54 PM  [facility] [address] [place], [place], [address] Phone: [phone]   Fax:  [phone]  Name: Kelly Douglas MRN: [id number] Date of Birth: 1955/11/28

## 2016-11-23 ENCOUNTER — Encounter (HOSPITAL_COMMUNITY): Payer: Self-pay

## 2016-11-23 ENCOUNTER — Ambulatory Visit (HOSPITAL_COMMUNITY): Payer: Medicare HMO

## 2016-11-23 DIAGNOSIS — M25512 Pain in left shoulder: Secondary | ICD-10-CM | POA: Diagnosis not present

## 2016-11-23 DIAGNOSIS — R29898 Other symptoms and signs involving the musculoskeletal system: Secondary | ICD-10-CM

## 2016-11-23 DIAGNOSIS — G8929 Other chronic pain: Secondary | ICD-10-CM

## 2016-11-23 DIAGNOSIS — M25612 Stiffness of left shoulder, not elsewhere classified: Secondary | ICD-10-CM

## 2016-11-23 NOTE — Therapy (Signed)
[facility] [place], [place], [address] Phone: [phone]   Fax:  [phone]  Occupational Therapy Treatment  Patient Details  Name: Kelly Douglas MRN: [id number] Date of Birth: 07-Mar-1955 Referring Provider: Arther Abbott. MD   Encounter Date: 11/23/2016  OT End of Session - 11/23/16 1639    Visit Number  6    Number of Visits  8    Date for OT Re-Evaluation  12/02/16    Authorization Type  [employer]    Authorization Time Period  before 10th visit    Authorization - Visit Number  6    Authorization - Number of Visits  10    OT Start Time  4166    OT Stop Time  1435    OT Time Calculation (min)  41 min    Activity Tolerance  Patient tolerated treatment well    Behavior During Therapy  WFL for tasks assessed/performed       Past Medical History:  Diagnosis Date  . Anxiety   . Arteriosclerotic cardiovascular disease (ASCVD) 2013   coronary calcification-left main, LAD and CX; small pericardial effusion  . Arthritis   . Chronic back pain   . Chronic bronchitis   . Chronic lung disease    Chronic scarring and volume loss-left lung; characteristics of a chronic infectious process-possible MAI  . Depression   . GERD (gastroesophageal reflux disease)    Dr Gala Romney EGD 09/2009->esophagitis, sm HH, antral erosions, atonic esophagus  . Hyperlipidemia   . Hypothyroid 1981 approx  . Scleroderma ([place])   . Seasonal allergies   . Syncope    Multiple spells over the past 40+ years, likely neurocardiogenic  . Tobacco abuse 06/24/2009    Past Surgical History:  Procedure Laterality Date  . BILATERAL SALPINGOOPHORECTOMY  2013    Dr. Elonda Husky; uterus remains in situ  . COLONOSCOPY  09/28/09   anal papilla otherwise normal  . ESOPHAGOGASTRODUODENOSCOPY  10/07/09   Dr. Barrie Dunker of esophageal mucosa, diffusely ?esophagitis (bx benign), small HH/antral erosions, erythema bx benign. 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 TUBAL LIGATION      There were no vitals filed for this visit.  Subjective Assessment - 11/23/16 1636    Subjective   S: I can move my arm so much better.    Currently in Pain?  Yes    Pain Score  4     Pain Location  Shoulder    Pain Orientation  Left    Pain Descriptors / Indicators  Aching;Constant    Pain Type  Chronic pain         [facility] OT Assessment - 11/23/16 1637      Assessment   Diagnosis  left shoulder pain      Precautions   Precautions  None               OT Treatments/Exercises (OP) - 11/23/16 1637      Exercises   Exercises  Shoulder      Shoulder Exercises: Supine   Protraction  Strengthening;12 reps    Protraction Weight (lbs)  1    Horizontal ABduction  Strengthening;12 reps    Horizontal ABduction Weight (lbs)  1    External Rotation  Strengthening;12 reps    External Rotation Weight (lbs)  1    Internal Rotation  Strengthening;12 reps    Internal Rotation Weight (lbs)  1    Flexion  Strengthening;12 reps    Shoulder Flexion Weight (  lbs)  1    ABduction  Strengthening;12 reps    Shoulder ABduction Weight (lbs)  1      Shoulder Exercises: Prone   Other Prone Exercises  Hughston exercises; H1, H3, H4 10X      Shoulder Exercises: Standing   Protraction  Strengthening;12 reps    Protraction Weight (lbs)  1    Horizontal ABduction  Strengthening;12 reps    Horizontal ABduction Weight (lbs)  1    External Rotation  Strengthening;12 reps    External Rotation Weight (lbs)  1    Internal Rotation  Strengthening;12 reps    Internal Rotation Weight (lbs)  1    Flexion  Strengthening;12 reps    Shoulder Flexion Weight (lbs)  1    ABduction  Strengthening;12 reps    Shoulder ABduction Weight (lbs)  1      Shoulder Exercises: ROM/Strengthening   Proximal Shoulder Strengthening, Supine  12X with 1# no rest breaks    Other ROM/Strengthening Exercises  Y arms lift off wall; 10X      Manual Therapy   Manual Therapy   Myofascial release    Manual therapy comments  Manual therapy completed prior to exercises.              OT Education - 11/23/16 1639    Education provided  Yes    Education Details  standing Hughston exercises H1, H3, H4    Person(s) Educated  Patient    Methods  Explanation;Handout;Demonstration    Comprehension  Verbalized understanding;Returned demonstration       OT Short Term Goals - 11/07/16 1319      OT SHORT TERM GOAL #1   Title  Patient will be educated and independent with HEP to increase functional use of LUE during daily tasks with decreased pain.    Time  4    Period  Weeks    Status  On-going      OT SHORT TERM GOAL #2   Title  Patient will increase A/ROM of her LUE to WNL to increase ability to complete grooming tasks such fixing her hair.    Time  4    Period  Weeks    Status  On-going      OT SHORT TERM GOAL #3   Title  Patient will increase LUE strength to 4/5 to increase ability to return to completing normal housekeeping tasks with increased comfort.     Time  4    Period  Weeks    Status  On-going      OT SHORT TERM GOAL #4   Title  Patient will decrease fascial restrictions to min amount or less to increase functional mobility of LUE.     Time  4    Period  Weeks    Status  On-going      OT SHORT TERM GOAL #5   Title  Patient will decrease pain level to 2/10 or less when completing daily tasks.     Time  4    Period  Weeks    Status  On-going               Plan - 11/23/16 1640    Clinical Impression Statement  A: Pt was able to complete Hughston exercises pron althou had increased difficulty with form and technique. Required verbal and tactile cueing during exercises. patient was given seated Hughston exercises to complete for HEP.     Plan  P: D/C passive stretching. Continue with  shoulder strengthening with theraband.        Patient will benefit from skilled therapeutic intervention in order to improve the following deficits  and impairments:  Decreased strength, Pain, Decreased range of motion, Increased fascial restricitons, Impaired UE functional use  Visit Diagnosis: Other symptoms and signs involving the musculoskeletal system  Stiffness of left shoulder, not elsewhere classified  Chronic left shoulder pain    Problem List Patient Active Problem List   Diagnosis Date Noted  . Pulmonary nodules 11/18/2016  . Bursitis of deltoid, left 07/12/2016  . Pollen allergies 05/19/2015  . Nicotine dependence 09/17/2013  . Cervical pain (neck) 06/21/2013  . Back pain with radiation 06/21/2013  . IDA (iron deficiency anemia) 08/10/2012  . Leg cramps 04/25/2012  . Essential hypertension 02/22/2012  . Cardiorenal syndrome 02/02/2012  . Insomnia 05/09/2011  . GERD (gastroesophageal reflux disease)   . Scleroderma ([place])   . Chronic lung disease   . Hypothyroid   . Arteriosclerotic cardiovascular disease (ASCVD)   . Syncope   . Anxiety and depression 06/24/2009  . Vitamin D deficiency 03/18/2009  . Hyperlipidemia 03/18/2009   Ailene Ravel, OTR/L,CBIS  [phone]  11/23/2016, 6:05 PM  [facility] [address] [place], [place], [address] Phone: [phone]   Fax:  [phone]  Name: KAINA ORENGO MRN: [id number] Date of Birth: May 11, 1955

## 2016-11-23 NOTE — Patient Instructions (Signed)
1) Holds arms up at shoulder level with thumbs up. Pinch shoulder blades back. Complete 10 times.  2) Hold arms up at shoulder level with thumbs pointed back behind you. Pinch shoulder blades back. Complete 10 times.  3) Face wall with arms up in a Y position, palms on wall. Lift hands off wall while pinching shoulder blades. Complete 10 times.

## 2016-11-28 ENCOUNTER — Ambulatory Visit (HOSPITAL_COMMUNITY): Payer: Medicare HMO

## 2016-11-28 ENCOUNTER — Ambulatory Visit (INDEPENDENT_AMBULATORY_CARE_PROVIDER_SITE_OTHER): Payer: Medicare HMO | Admitting: Family Medicine

## 2016-11-28 ENCOUNTER — Other Ambulatory Visit (HOSPITAL_COMMUNITY)
Admission: RE | Admit: 2016-11-28 | Discharge: 2016-11-28 | Disposition: A | Discharge status: Home / Self Care | Payer: Medicare HMO | Source: Ambulatory Visit | Attending: Family Medicine | Admitting: Family Medicine

## 2016-11-28 ENCOUNTER — Encounter: Payer: Self-pay | Admitting: Family Medicine

## 2016-11-28 ENCOUNTER — Encounter (HOSPITAL_COMMUNITY): Payer: Self-pay

## 2016-11-28 VITALS — BP 120/76 | HR 81 | Ht 68.0 in | Wt 163.0 lb

## 2016-11-28 DIAGNOSIS — M25531 Pain in right wrist: Secondary | ICD-10-CM | POA: Insufficient documentation

## 2016-11-28 DIAGNOSIS — E559 Vitamin D deficiency, unspecified: Secondary | ICD-10-CM

## 2016-11-28 DIAGNOSIS — Z01419 Encounter for gynecological examination (general) (routine) without abnormal findings: Secondary | ICD-10-CM

## 2016-11-28 DIAGNOSIS — M25512 Pain in left shoulder: Secondary | ICD-10-CM

## 2016-11-28 DIAGNOSIS — Z1211 Encounter for screening for malignant neoplasm of colon: Secondary | ICD-10-CM | POA: Diagnosis not present

## 2016-11-28 DIAGNOSIS — F17210 Nicotine dependence, cigarettes, uncomplicated: Secondary | ICD-10-CM | POA: Diagnosis not present

## 2016-11-28 DIAGNOSIS — Z124 Encounter for screening for malignant neoplasm of cervix: Secondary | ICD-10-CM | POA: Insufficient documentation

## 2016-11-28 DIAGNOSIS — I10 Essential (primary) hypertension: Secondary | ICD-10-CM

## 2016-11-28 DIAGNOSIS — R29898 Other symptoms and signs involving the musculoskeletal system: Secondary | ICD-10-CM

## 2016-11-28 DIAGNOSIS — Z23 Encounter for immunization: Secondary | ICD-10-CM | POA: Diagnosis not present

## 2016-11-28 DIAGNOSIS — G8929 Other chronic pain: Secondary | ICD-10-CM | POA: Diagnosis not present

## 2016-11-28 DIAGNOSIS — Z0000 Encounter for general adult medical examination without abnormal findings: Secondary | ICD-10-CM | POA: Diagnosis not present

## 2016-11-28 DIAGNOSIS — F17218 Nicotine dependence, cigarettes, with other nicotine-induced disorders: Secondary | ICD-10-CM

## 2016-11-28 DIAGNOSIS — E785 Hyperlipidemia, unspecified: Secondary | ICD-10-CM

## 2016-11-28 DIAGNOSIS — M25612 Stiffness of left shoulder, not elsewhere classified: Secondary | ICD-10-CM

## 2016-11-28 DIAGNOSIS — M549 Dorsalgia, unspecified: Secondary | ICD-10-CM

## 2016-11-28 LAB — POC HEMOCCULT BLD/STL (OFFICE/1-CARD/DIAGNOSTIC): Fecal Occult Blood, POC: NEGATIVE

## 2016-11-28 MED ORDER — BUSPIRONE HCL 10 MG PO TABS: 10.0000 mg | ORAL_TABLET | Freq: Three times a day (TID) | ORAL | 1 refills | Status: DC

## 2016-11-28 MED ORDER — DULOXETINE HCL 60 MG PO CPEP: 60.0000 mg | ORAL_CAPSULE | Freq: Every day | ORAL | 1 refills | Status: DC

## 2016-11-28 MED ORDER — PREDNISONE 5 MG PO TABS: 5.0000 mg | ORAL_TABLET | Freq: Two times a day (BID) | ORAL | 0 refills | Status: AC

## 2016-11-28 NOTE — Therapy (Signed)
[place], [place], [address] Phone: [phone]   Fax:  [phone]  Occupational Therapy Treatment  Patient Details  Name: Kelly Douglas MRN: [id number] Date of Birth: 1955/10/18 Referring Provider: Arther Abbott. MD   Encounter Date: 11/28/2016  OT End of Session - 11/28/16 1249    Visit Number  7    Number of Visits  8    Date for OT Re-Evaluation  12/02/16    Authorization Type  [employer]    Authorization Time Period  before 10th visit    Authorization - Visit Number  7    Authorization - Number of Visits  10    OT Start Time  1120    OT Stop Time  1200    OT Time Calculation (min)  40 min    Activity Tolerance  Patient tolerated treatment well    Behavior During Therapy  WFL for tasks assessed/performed       Past Medical History:  Diagnosis Date  . Anxiety   . Arteriosclerotic cardiovascular disease (ASCVD) 2013   coronary calcification-left main, LAD and CX; small pericardial effusion  . Arthritis   . Chronic back pain   . Chronic bronchitis   . Chronic lung disease    Chronic scarring and volume loss-left lung; characteristics of a chronic infectious process-possible MAI  . Depression   . GERD (gastroesophageal reflux disease)    Dr Gala Romney EGD 09/2009->esophagitis, sm HH, antral erosions, atonic esophagus  . Hyperlipidemia   . Hypothyroid 1981 approx  . Scleroderma ([place])   . Seasonal allergies   . Syncope    Multiple spells over the past 40+ years, likely neurocardiogenic  . Tobacco abuse 06/24/2009    Past Surgical History:  Procedure Laterality Date  . BILATERAL SALPINGOOPHORECTOMY  2013    Dr. Elonda Husky; uterus remains in situ  . COLONOSCOPY  09/28/09   anal papilla otherwise normal  . ESOPHAGOGASTRODUODENOSCOPY  10/07/09   Dr. Barrie Dunker of esophageal mucosa, diffusely ?esophagitis (bx benign), small HH/antral erosions, erythema bx benign. 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 TUBAL LIGATION      There were no vitals filed for this visit.  Subjective Assessment - 11/28/16 1135    Subjective   S: It only hurts at a certain movement.    Patient is accompained by:  Family member daughter and grandson    Currently in Pain?  Yes    Pain Score  3     Pain Location  Shoulder    Pain Orientation  Left    Pain Descriptors / Indicators  Aching;Constant    Pain Type  Chronic pain    Pain Radiating Towards  N/A    Pain Onset  More than a month ago    Pain Frequency  Occasional    Aggravating Factors   Certain movements    Pain Relieving Factors  pain medication and rest    Effect of Pain on Daily Activities  min effect    Multiple Pain Sites  No         [facility] OT Assessment - 11/28/16 1137      Assessment   Diagnosis  left shoulder pain      Precautions   Precautions  None               OT Treatments/Exercises (OP) - 11/28/16 1137      Exercises   Exercises  Shoulder      Shoulder Exercises: Supine  Protraction  Strengthening;10 reps    Protraction Weight (lbs)  2    Horizontal ABduction  Strengthening;10 reps    Horizontal ABduction Weight (lbs)  2    External Rotation  Strengthening;10 reps    External Rotation Weight (lbs)  2    Internal Rotation  Strengthening;10 reps    Internal Rotation Weight (lbs)  2    Flexion  Strengthening;10 reps    Shoulder Flexion Weight (lbs)  2    ABduction  Strengthening;10 reps    Shoulder ABduction Weight (lbs)  2      Shoulder Exercises: Standing   Protraction  Strengthening;10 reps    Protraction Weight (lbs)  2    Horizontal ABduction  Strengthening;10 reps    Horizontal ABduction Weight (lbs)  2    External Rotation  Strengthening;10 reps    External Rotation Weight (lbs)  2    Internal Rotation  Strengthening;10 reps    Internal Rotation Weight (lbs)  2    Flexion  Strengthening;10 reps    Shoulder Flexion Weight (lbs)  2    ABduction  Strengthening;10 reps    Shoulder  ABduction Weight (lbs)  2      Shoulder Exercises: ROM/Strengthening   UBE (Upper Arm Bike)  Level 1 2' forward 2' reverse    X to V Arms  12X with 1#    Proximal Shoulder Strengthening, Supine  12X with 2# no rest breaks    Proximal Shoulder Strengthening, Seated  12X with 1# with 2 rest breaks      Manual Therapy   Manual Therapy  Myofascial release    Manual therapy comments  Manual therapy completed prior to exercises.     Myofascial Release  Myofascial release completed to left upper arm, trapezius, and scapularis region to decrease fascial restrictions and increase joint mobility in a pain free zone.               OT Short Term Goals - 11/07/16 1319      OT SHORT TERM GOAL #1   Title  Patient will be educated and independent with HEP to increase functional use of LUE during daily tasks with decreased pain.    Time  4    Period  Weeks    Status  On-going      OT SHORT TERM GOAL #2   Title  Patient will increase A/ROM of her LUE to WNL to increase ability to complete grooming tasks such fixing her hair.    Time  4    Period  Weeks    Status  On-going      OT SHORT TERM GOAL #3   Title  Patient will increase LUE strength to 4/5 to increase ability to return to completing normal housekeeping tasks with increased comfort.     Time  4    Period  Weeks    Status  On-going      OT SHORT TERM GOAL #4   Title  Patient will decrease fascial restrictions to min amount or less to increase functional mobility of LUE.     Time  4    Period  Weeks    Status  On-going      OT SHORT TERM GOAL #5   Title  Patient will decrease pain level to 2/10 or less when completing daily tasks.     Time  4    Period  Weeks    Status  On-going  Plan - 11/28/16 1249    Clinical Impression Statement  A: Progressed patient to 2# hand weights for supine and standing strengthening. Patient presented with shoulder fatigue and took several rest breaks as needed. VC for  form and technique.    Plan  P: Reassess and discharge with updated HEP next session.       Patient will benefit from skilled therapeutic intervention in order to improve the following deficits and impairments:  Decreased strength, Pain, Decreased range of motion, Increased fascial restricitons, Impaired UE functional use  Visit Diagnosis: Other symptoms and signs involving the musculoskeletal system  Stiffness of left shoulder, not elsewhere classified  Chronic left shoulder pain    Problem List Patient Active Problem List   Diagnosis Date Noted  . Pulmonary nodules 11/18/2016  . Bursitis of deltoid, left 07/12/2016  . Pollen allergies 05/19/2015  . Annual physical exam 11/11/2013  . Nicotine dependence 09/17/2013  . Wrist pain, acute, right 06/22/2013  . Cervical pain (neck) 06/21/2013  . Back pain with radiation 06/21/2013  . IDA (iron deficiency anemia) 08/10/2012  . Leg cramps 04/25/2012  . Essential hypertension 02/22/2012  . Cardiorenal syndrome 02/02/2012  . Insomnia 05/09/2011  . GERD (gastroesophageal reflux disease)   . Scleroderma ([place])   . Chronic lung disease   . Hypothyroid   . Arteriosclerotic cardiovascular disease (ASCVD)   . Syncope   . Anxiety and depression 06/24/2009  . Vitamin D deficiency 03/18/2009  . Hyperlipidemia 03/18/2009   Ailene Ravel, OTR/L,CBIS  [phone]  11/28/2016, 12:52 PM  [facility] [address] [place], [place], [address] Phone: [phone]   Fax:  [phone]  Name: Kelly Douglas MRN: [id number] Date of Birth: 05-29-1955

## 2016-11-28 NOTE — Patient Instructions (Addendum)
Wellness with nurse in February  Flu vaccine and pap smear today  MD follow up in March  Fasting lipid, cmp and EGFR 1 week before March visit with MD  You are referred  To Dr Aline Brochure for right wrist pain, and also sent in 5 days of prednisone , start today Need to quit  Smoking since you want to live long and in good health Now at 10 per day  Steps to Quit Smoking Smoking tobacco can be bad for your health. It can also affect almost every organ in your body. Smoking puts you and people around you at risk for many serious long-lasting (chronic) diseases. Quitting smoking is hard, but it is one of the best things that you can do for your health. It is never too late to quit. What are the benefits of quitting smoking? When you quit smoking, you lower your risk for getting serious diseases and conditions. They can include:  Lung cancer or lung disease.  Heart disease.  Stroke.  Heart attack.  Not being able to have children (infertility).  Weak bones (osteoporosis) and broken bones (fractures).  If you have coughing, wheezing, and shortness of breath, those symptoms may get better when you quit. You may also get sick less often. If you are pregnant, quitting smoking can help to lower your chances of having a baby of low birth weight. What can I do to help me quit smoking? Talk with your doctor about what can help you quit smoking. Some things you can do (strategies) include:  Quitting smoking totally, instead of slowly cutting back how much you smoke over a period of time.  Going to in-person counseling. You are more likely to quit if you go to many counseling sessions.  Using resources and support systems, such as: ? Database administrator with a Social worker. ? Phone quitlines. ? Careers information officer. ? Support groups or group counseling. ? Text messaging programs. ? Mobile phone apps or applications.  Taking medicines. Some of these medicines may have nicotine in them. If you  are pregnant or breastfeeding, do not take any medicines to quit smoking unless your doctor says it is okay. Talk with your doctor about counseling or other things that can help you.  Talk with your doctor about using more than one strategy at the same time, such as taking medicines while you are also going to in-person counseling. This can help make quitting easier. What things can I do to make it easier to quit? Quitting smoking might feel very hard at first, but there is a lot that you can do to make it easier. Take these steps:  Talk to your family and friends. Ask them to support and encourage you.  Call phone quitlines, reach out to support groups, or work with a Social worker.  Ask people who smoke to not smoke around you.  Avoid places that make you want (trigger) to smoke, such as: ? Bars. ? Parties. ? Smoke-break areas at work.  Spend time with people who do not smoke.  Lower the stress in your life. Stress can make you want to smoke. Try these things to help your stress: ? Getting regular exercise. ? Deep-breathing exercises. ? Yoga. ? Meditating. ? Doing a body scan. To do this, close your eyes, focus on one area of your body at a time from head to toe, and notice which parts of your body are tense. Try to relax the muscles in those areas.  Download or buy apps on  your mobile phone or tablet that can help you stick to your quit plan. There are many free apps, such as QuitGuide from the [employer] Office manager for Disease Control and Prevention). You can find more support from smokefree.gov and other websites.  This information is not intended to replace advice given to you by your health care provider. Make sure you discuss any questions you have with your health care provider. Document Released: 10/30/2008 Document Revised: 09/01/2015 Document Reviewed: 05/20/2014 Elsevier Interactive Patient Education  2018 [employer].

## 2016-11-28 NOTE — Assessment & Plan Note (Addendum)
2 week h/o right wrist pain , no trauma, will prescribe short  Course of anti inflammatory, and referral added to orthopedics who she already has an appt with later this week for another joint problem

## 2016-11-28 NOTE — Progress Notes (Signed)
Kelly Douglas     MRN: [id number]      DOB: Aug 28, 1955  HPI: Patient is in for annual physical exam. C/o right wrist pain and swelling limiting strength and mobility, Recent labs, if available are reviewed. Immunization is reviewed , and  updated    PE: BP 120/76   Pulse 81   Ht 5\' 8"  (1.727 m)   Wt 163 lb (73.9 kg)   SpO2 96%   BMI 24.78 kg/m   Pleasant  female, alert and oriented x 3, in no cardio-pulmonary distress. Afebrile. HEENT No facial trauma or asymetry. Sinuses non tender.  Extra occullar muscles intact, pupils equally reactive to light. External ears normal, tympanic membranes clear. Oropharynx moist, no exudate. Neck: supple, no adenopathy,JVD or thyromegaly.No bruits.  Chest: Clear to ascultation bilaterally.No crackles or wheezes. Non tender to palpation  Breast: No asymetry,no masses or lumps. No tenderness. No nipple discharge or inversion. No axillary or supraclavicular adenopathy  Cardiovascular system; Heart sounds normal,  S1 and  S2 ,no S3.  No murmur, or thrill. Apical beat not displaced Peripheral pulses normal.  Abdomen: Soft, non tender, no organomegaly or masses. No bruits. Bowel sounds normal. No guarding, tenderness or rebound.  Rectal:  Normal sphincter tone. No rectal mass. Guaiac negative stool.  GU: External genitalia normal female genitalia , normal female distribution of hair. No lesions. Urethral meatus normal in size, no  Prolapse, no lesions visibly  Present. Bladder non tender. Vagina pink and moist , with no visible lesions , discharge present . Adequate pelvic support no  cystocele or rectocele noted Cervix pink and appears healthy, no lesions or ulcerations noted, no discharge noted from os Uterus normal size, no adnexal masses, no cervical motion or adnexal tenderness.   Musculoskeletal exam: Decreased though adequate  ROM of spine, hips , shoulders and knees.Swelling and tenderness of right wrist No  deformity ,swelling or crepitus noted. No muscle wasting or atrophy.   Neurologic: Cranial nerves 2 to 12 intact. Power, tone ,sensation and reflexes normal throughout. No disturbance in gait. No tremor.  Skin: Intact, no ulceration, erythema , scaling or rash noted. Pigmentation normal throughout  Psych; Normal mood and affect. Judgement and concentration normal   Assessment & Plan:  Wrist pain, acute, right 2 week h/o right wrist pain , no trauma, will prescribe short  Course of anti inflammatory, and referral added to orthopedics who she already has an appt with later this week for another joint problem  Annual physical exam Annual exam as documented. Counseling done  re healthy lifestyle involving commitment to 150 minutes exercise per week, heart healthy diet, and attaining healthy weight.The importance of adequate sleep also discussed. Regular seat belt use and home safety, is also discussed. Changes in health habits are decided on by the patient with goals and time frames  set for achieving them. Immunization and cancer screening needs are specifically addressed at this visit.   Nicotine dependence Patient is asked and  confirms current  Nicotine use.  Five to seven minutes of time is spent in counseling the patient of the need to quit smoking  Advice to quit is delivered clearly specifically in reducing the risk of developing heart disease, having a stroke, or of developing all types of cancer, especially lung and oral cancer. Improvement in breathing and exercise tolerance and quality of life is also discussed, as is the economic benefit.  Assessment of willingness to quit or to make an attempt to quit is made  and documented  Assistance in quit attempt is made with several and varied options presented, based on patient's desire and need. These include  literature, local classes available, 1800 QUIT NOW number, OTC and prescription medication.  The GOAL to be NICOTINE  FREE is re emphasized.  The patient has set a personal goal of either reduction or discontinuation and follow up is arranged between 6 an 16 weeks.

## 2016-11-29 ENCOUNTER — Encounter: Payer: Self-pay | Admitting: Family Medicine

## 2016-11-29 NOTE — Assessment & Plan Note (Signed)

## 2016-11-29 NOTE — Assessment & Plan Note (Signed)

## 2016-11-30 ENCOUNTER — Other Ambulatory Visit: Payer: Self-pay

## 2016-11-30 ENCOUNTER — Ambulatory Visit (HOSPITAL_COMMUNITY): Payer: Medicare HMO | Admitting: Occupational Therapy

## 2016-11-30 ENCOUNTER — Encounter (HOSPITAL_COMMUNITY): Payer: Self-pay | Admitting: Occupational Therapy

## 2016-11-30 DIAGNOSIS — M25612 Stiffness of left shoulder, not elsewhere classified: Secondary | ICD-10-CM | POA: Diagnosis not present

## 2016-11-30 DIAGNOSIS — R29898 Other symptoms and signs involving the musculoskeletal system: Secondary | ICD-10-CM

## 2016-11-30 DIAGNOSIS — G8929 Other chronic pain: Secondary | ICD-10-CM

## 2016-11-30 DIAGNOSIS — M25512 Pain in left shoulder: Secondary | ICD-10-CM | POA: Diagnosis not present

## 2016-11-30 NOTE — Patient Instructions (Signed)
Theraband strengthening: Complete 10-15X, 1-2X/day  1) Shoulder protraction  Anchor band in doorway, stand with back to door. Push your hand forward as much as you can to bringing your shoulder blades forward on your rib cage.     2) Shoulder flexion  While standing with back to the door, holding Theraband at hand level, raise arm in front of you.  Keep elbow straight through entire movement.      3) Shoulder horizontal abduction  Standing with a theraband anchored at chest height, begin with arm straight and some tension in the band. Move your arm out to your side (keeping straight the whole time). Bring the affected arm back to midline.     4) Shoulder Internal Rotation  While holding an elastic band at your side with your elbow bent, start with your hand away from your stomach, then pull the band towards your stomach. Keep your elbow near your side the entire time.     5) Shoulder External Rotation  While holding an elastic band at your side with your elbow bent, start with your hand near your stomach and then pull the band away. Keep your elbow at your side the entire time.     6) Shoulder abduction  While holding an elastic band at your side, draw up your arm to the side keeping your elbow straight.     (Home) Extension: Isometric / Bilateral Arm Retraction - Sitting   Facing anchor, hold hands and elbow at shoulder height, with elbow bent.  Pull arms back to squeeze shoulder blades together. Repeat 10-15 times. 1-3 times/day.   Copyright  VHI. All rights reserved.   (Home) Retraction: Row - Bilateral (Anchor)   Facing anchor, arms reaching forward, pull hands toward stomach, keeping elbows bent and at your sides and pinching shoulder blades together. Repeat 10-15 times. 1-3 times/day.   Copyright  VHI. All rights reserved.   (Clinic) Extension / Flexion (Assist)   Face anchor, pull arms back, keeping elbow straight, and squeze shoulder blades  together. Repeat 10-15 times. 1-3 times/day.   Copyright  VHI. All rights reserved.

## 2016-11-30 NOTE — Therapy (Signed)
[place], [place], [address] Phone: [phone]   Fax:  [phone]  Occupational Therapy Reassessment, Treatment, and Discharge Summary  Patient Details  Name: Kelly Douglas MRN: [id number] Date of Birth: 11/30/1955 Referring Provider: Arther Abbott. MD   Encounter Date: 11/30/2016  OT End of Session - 11/30/16 1415    Visit Number  8    Number of Visits  8    Date for OT Re-Evaluation  12/02/16    Authorization Type  [employer]    Authorization Time Period  before 10th visit    Authorization - Visit Number  8    Authorization - Number of Visits  10    OT Start Time  3295    OT Stop Time  1415    OT Time Calculation (min)  30 min    Activity Tolerance  Patient tolerated treatment well    Behavior During Therapy  WFL for tasks assessed/performed       Past Medical History:  Diagnosis Date  . Anxiety   . Arteriosclerotic cardiovascular disease (ASCVD) 2013   coronary calcification-left main, LAD and CX; small pericardial effusion  . Arthritis   . Chronic back pain   . Chronic bronchitis   . Chronic lung disease    Chronic scarring and volume loss-left lung; characteristics of a chronic infectious process-possible MAI  . Depression   . GERD (gastroesophageal reflux disease)    Dr Gala Romney EGD 09/2009->esophagitis, sm HH, antral erosions, atonic esophagus  . Hyperlipidemia   . Hypothyroid 1981 approx  . Scleroderma ([place])   . Seasonal allergies   . Syncope    Multiple spells over the past 40+ years, likely neurocardiogenic  . Tobacco abuse 06/24/2009    Past Surgical History:  Procedure Laterality Date  . BILATERAL SALPINGOOPHORECTOMY  2013    Dr. Elonda Husky; uterus remains in situ  . COLONOSCOPY  09/28/09   anal papilla otherwise normal  . ESOPHAGOGASTRODUODENOSCOPY  10/07/09   Dr. Barrie Dunker of esophageal mucosa, diffusely ?esophagitis (bx benign), small HH/antral erosions, erythema bx  benign. 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 TUBAL LIGATION      There were no vitals filed for this visit.  Subjective Assessment - 11/30/16 1342    Subjective   S: It's easier to comb my hair now.     Currently in Pain?  No/denies         [facility] OT Assessment - 11/30/16 1342      Assessment   Diagnosis  left shoulder pain      Precautions   Precautions  None      Palpation   Palpation comment  Min fascial restrictions in left upper arm, trapezius, and scapularis regions      AROM   Overall AROM Comments  Assessed seated. IR/er adducted.    AROM Assessment Site  Shoulder    Right/Left Shoulder  Left    Left Shoulder Flexion  180 Degrees 142 previous    Left Shoulder ABduction  180 Degrees 57 previous    Left Shoulder Internal Rotation  90 Degrees same as previous    Left Shoulder External Rotation  90 Degrees 70 previous      PROM   Overall PROM Comments  Assessed supine. IR/er adducted.    PROM Assessment Site  Shoulder    Right/Left Shoulder  Left    Left Shoulder Flexion  180 Degrees 150 previous    Left Shoulder ABduction  180 Degrees same  as previous    Left Shoulder Internal Rotation  90 Degrees same as previous    Left Shoulder External Rotation  90 Degrees same as previous      Strength   Overall Strength Comments  Assessed seated. IR/er adducted.    Strength Assessment Site  Shoulder    Right/Left Shoulder  Left    Left Shoulder Flexion  5/5 3-/5 previous    Left Shoulder ABduction  5/5 3-/5 previous    Left Shoulder Internal Rotation  5/5 3/5 previous    Left Shoulder External Rotation  5/5 3/5 previous               OT Treatments/Exercises (OP) - 11/30/16 1352      Exercises   Exercises  Shoulder      Shoulder Exercises: Standing   Protraction  Theraband;10 reps    Theraband Level (Shoulder Protraction)  Level 3 (Green)    Horizontal ABduction  Theraband;10 reps    Theraband Level (Shoulder Horizontal ABduction)  Level 3 (Green)     External Rotation  Theraband;10 reps    Theraband Level (Shoulder External Rotation)  Level 3 (Green)    Internal Rotation  Theraband;10 reps    Theraband Level (Shoulder Internal Rotation)  Level 3 (Green)    Flexion  Theraband;10 reps    Theraband Level (Shoulder Flexion)  Level 3 (Green)    ABduction  Theraband;10 reps    Theraband Level (Shoulder ABduction)  Level 3 (Green)    Extension  [employer] reps    Theraband Level (Shoulder Extension)  Level 3 (Green)    Row  [employer] reps    Theraband Level (Shoulder Row)  Level 3 (Green)    Retraction  Theraband;12 reps    Theraband Level (Shoulder Retraction)  Level 3 (Green)             OT Education - 11/30/16 1355    Education provided  Yes    Education Details  green theraband strengthening and green scapular theraband    Person(s) Educated  Patient    Methods  Explanation;Demonstration;Handout    Comprehension  Verbalized understanding;Returned demonstration       OT Short Term Goals - 11/30/16 1351      OT SHORT TERM GOAL #1   Title  Patient will be educated and independent with HEP to increase functional use of LUE during daily tasks with decreased pain.    Time  4    Period  Weeks    Status  Achieved      OT SHORT TERM GOAL #2   Title  Patient will increase A/ROM of her LUE to WNL to increase ability to complete grooming tasks such fixing her hair.    Time  4    Period  Weeks    Status  Achieved      OT SHORT TERM GOAL #3   Title  Patient will increase LUE strength to 4/5 to increase ability to return to completing normal housekeeping tasks with increased comfort.     Time  4    Period  Weeks    Status  Achieved      OT SHORT TERM GOAL #4   Title  Patient will decrease fascial restrictions to min amount or less to increase functional mobility of LUE.     Time  4    Period  Weeks    Status  Achieved      OT SHORT TERM GOAL #5   Title  Patient  will decrease pain level to 2/10 or less when  completing daily tasks.     Time  4    Period  Weeks    Status  Partially Met               Plan - 12/03/16 1416    Clinical Impression Statement  A: Reassesment completed today, pt has met 4/5 STGs and partially met her one remaining goal. Pt has made great progress with ROM and strength, reports she only experiences pain occasionally like when she sleeps on her shoulder. Pt educated on HEP for theraband strengthening, discussed frequency of HEP completion. Pt is agreeable to discharge with HEP.     Plan  P: Discharge pt.     Consulted and Agree with Plan of Care  Patient       Patient will benefit from skilled therapeutic intervention in order to improve the following deficits and impairments:  Decreased strength, Pain, Decreased range of motion, Increased fascial restricitons, Impaired UE functional use  Visit Diagnosis: Other symptoms and signs involving the musculoskeletal system  Stiffness of left shoulder, not elsewhere classified  Chronic left shoulder pain  G-Codes - 03-Dec-2016 1417    Functional Assessment Tool Used (Outpatient only)  FOTO score: 69/100 (31% impaired)    Functional Limitation  Carrying, moving and handling objects    Carrying, Moving and Handling Objects Goal Status (E3343)  At least 20 percent but less than 40 percent impaired, limited or restricted    Carrying, Moving and Handling Objects Discharge Status [phone])  At least 20 percent but less than 40 percent impaired, limited or restricted       Problem List Patient Active Problem List   Diagnosis Date Noted  . Pulmonary nodules 11/18/2016  . Pollen allergies 05/19/2015  . Annual physical exam 11/11/2013  . Nicotine dependence 09/17/2013  . Wrist pain, acute, right 06/22/2013  . Cervical pain (neck) 06/21/2013  . Back pain with radiation 06/21/2013  . IDA (iron deficiency anemia) 08/10/2012  . Leg cramps 04/25/2012  . Essential hypertension 02/22/2012  . Cardiorenal syndrome 02/02/2012   . Insomnia 05/09/2011  . GERD (gastroesophageal reflux disease)   . Scleroderma ([place])   . Chronic lung disease   . Hypothyroid   . Arteriosclerotic cardiovascular disease (ASCVD)   . Syncope   . Anxiety and depression 06/24/2009  . Vitamin D deficiency 03/18/2009  . Hyperlipidemia 03/18/2009   Guadelupe Sabin, OTR/L  [phone] 03-Dec-2016, 2:18 PM  [place], [place], [address] Phone: [phone]   Fax:  [phone]  Name: Kelly Douglas MRN: [id number] Date of Birth: [birth year]    OCCUPATIONAL THERAPY DISCHARGE SUMMARY  Visits from Start of Care: 8  Current functional level related to goals / functional outcomes: See above. Pt reports she is now able to reach over her head, wash her hair, and use LUE as non-dominant during all B/ADL tasks with minimal pain.    Remaining deficits: Pt experiences occasional pain which remains at a 3/10 or less, mostly if she sleeps on the arm for a sustained amount of time.    Education / Equipment: Nyoka Cowden theraband strengthening Plan: Patient agrees to discharge.  Patient goals were met. Patient is being discharged due to meeting the stated rehab goals.  ?????

## 2016-12-01 LAB — CYTOLOGY - PAP
DIAGNOSIS: NEGATIVE
HPV (WINDOPATH): NOT DETECTED

## 2016-12-02 ENCOUNTER — Ambulatory Visit: Payer: Medicare HMO | Admitting: Orthopedic Surgery

## 2016-12-02 ENCOUNTER — Encounter: Payer: Self-pay | Admitting: Orthopedic Surgery

## 2016-12-02 VITALS — BP 132/82 | HR 84 | Ht 68.0 in | Wt 159.0 lb

## 2016-12-02 DIAGNOSIS — M7582 Other shoulder lesions, left shoulder: Secondary | ICD-10-CM

## 2016-12-02 DIAGNOSIS — M25512 Pain in left shoulder: Secondary | ICD-10-CM

## 2016-12-02 DIAGNOSIS — M19019 Primary osteoarthritis, unspecified shoulder: Secondary | ICD-10-CM

## 2016-12-02 MED ORDER — MELOXICAM 7.5 MG PO TABS: 7.5000 mg | ORAL_TABLET | Freq: Every day | ORAL | 5 refills | Status: DC

## 2016-12-02 NOTE — Patient Instructions (Signed)
Steps to Quit Smoking Smoking tobacco can be bad for your health. It can also affect almost every organ in your body. Smoking puts you and people around you at risk for many serious long-lasting (chronic) diseases. Quitting smoking is hard, but it is one of the best things that you can do for your health. It is never too late to quit. What are the benefits of quitting smoking? When you quit smoking, you lower your risk for getting serious diseases and conditions. They can include:  Lung cancer or lung disease.  Heart disease.  Stroke.  Heart attack.  Not being able to have children (infertility).  Weak bones (osteoporosis) and broken bones (fractures).  If you have coughing, wheezing, and shortness of breath, those symptoms may get better when you quit. You may also get sick less often. If you are pregnant, quitting smoking can help to lower your chances of having a baby of low birth weight. What can I do to help me quit smoking? Talk with your doctor about what can help you quit smoking. Some things you can do (strategies) include:  Quitting smoking totally, instead of slowly cutting back how much you smoke over a period of time.  Going to in-person counseling. You are more likely to quit if you go to many counseling sessions.  Using resources and support systems, such as: ? Online chats with a counselor. ? Phone quitlines. ? Printed self-help materials. ? Support groups or group counseling. ? Text messaging programs. ? Mobile phone apps or applications.  Taking medicines. Some of these medicines may have nicotine in them. If you are pregnant or breastfeeding, do not take any medicines to quit smoking unless your doctor says it is okay. Talk with your doctor about counseling or other things that can help you.  Talk with your doctor about using more than one strategy at the same time, such as taking medicines while you are also going to in-person counseling. This can help make  quitting easier. What things can I do to make it easier to quit? Quitting smoking might feel very hard at first, but there is a lot that you can do to make it easier. Take these steps:  Talk to your family and friends. Ask them to support and encourage you.  Call phone quitlines, reach out to support groups, or work with a counselor.  Ask people who smoke to not smoke around you.  Avoid places that make you want (trigger) to smoke, such as: ? Bars. ? Parties. ? Smoke-break areas at work.  Spend time with people who do not smoke.  Lower the stress in your life. Stress can make you want to smoke. Try these things to help your stress: ? Getting regular exercise. ? Deep-breathing exercises. ? Yoga. ? Meditating. ? Doing a body scan. To do this, close your eyes, focus on one area of your body at a time from head to toe, and notice which parts of your body are tense. Try to relax the muscles in those areas.  Download or buy apps on your mobile phone or tablet that can help you stick to your quit plan. There are many free apps, such as QuitGuide from the CDC (Centers for Disease Control and Prevention). You can find more support from smokefree.gov and other websites.  This information is not intended to replace advice given to you by your health care provider. Make sure you discuss any questions you have with your health care provider. Document Released: 10/30/2008 Document   Revised: 09/01/2015 Document Reviewed: 05/20/2014 Elsevier Interactive Patient Education  2018 Elsevier Inc.  

## 2016-12-02 NOTE — Progress Notes (Signed)
Chief Complaint  Patient presents with  . Shoulder Pain    left, feels much better with physical therapy    Encounter Diagnosis  Name Primary?  . Rotator cuff tendonitis, left Yes    Meds ordered this encounter  Medications  . meloxicam (MOBIC) 7.5 MG tablet    Sig: Take 1 tablet (7.5 mg total) daily by mouth.    Dispense:  180 tablet    Refill:  5    **Patient requests 90 days supply**   Rotator cuff tendinitis interstitial tearing patient went to therapy she has improved.  She did have to have a new prescription for Mobic for her arthritis in her back and in her shoulder.  She would like to follow-up with [place] in January for her right wrist.  Chief complaint left shoulder pain  History 61 year old left shoulder pain sent for therapy after MRI showed nonsurgical interstitial tearing and tendinitis of the left shoulder  She has improved significantly  Review of systems she complains of pain in her right wrist and lower back and would like to be seen for the wrist in January and she would like her meloxicam read examination today reveals no tenderness in the left shoulder she has full range of motion including full forward elevation of 180 degrees internal rotation thoracic level for shoulder stable in abduction external rotation rotator cuff strength is grade 5 her skin is warm dry and intact her neurovascular exam is normal

## 2016-12-05 ENCOUNTER — Ambulatory Visit: Payer: Medicare HMO | Admitting: Orthopedic Surgery

## 2016-12-20 ENCOUNTER — Encounter: Payer: Self-pay | Admitting: Family Medicine

## 2016-12-23 ENCOUNTER — Telehealth: Payer: Self-pay | Admitting: Family Medicine

## 2016-12-23 NOTE — Telephone Encounter (Signed)
Patient called in stating that [facility] did not have enough pills to fill her 90 day RX of temazepam, she only received 5 pills. She is requesting the rest of her prescription be filled from [facility]. Cb#: [phone]

## 2016-12-27 ENCOUNTER — Other Ambulatory Visit: Payer: Self-pay

## 2016-12-27 DIAGNOSIS — G4700 Insomnia, unspecified: Secondary | ICD-10-CM

## 2016-12-27 MED ORDER — TEMAZEPAM 30 MG PO CAPS: 30.0000 mg | ORAL_CAPSULE | Freq: Every day | ORAL | 1 refills | Status: DC

## 2016-12-27 NOTE — Telephone Encounter (Signed)
Med refilled.

## 2017-01-24 ENCOUNTER — Encounter: Payer: Self-pay | Admitting: Internal Medicine

## 2017-02-01 ENCOUNTER — Ambulatory Visit: Payer: Medicare HMO | Admitting: Orthopedic Surgery

## 2017-02-13 ENCOUNTER — Other Ambulatory Visit: Payer: Self-pay | Admitting: Family Medicine

## 2017-02-13 DIAGNOSIS — Z9109 Other allergy status, other than to drugs and biological substances: Secondary | ICD-10-CM

## 2017-02-21 ENCOUNTER — Other Ambulatory Visit: Payer: Self-pay | Admitting: Family Medicine

## 2017-03-13 ENCOUNTER — Ambulatory Visit: Payer: Medicare HMO

## 2017-03-14 ENCOUNTER — Ambulatory Visit: Payer: Medicare HMO

## 2017-03-28 ENCOUNTER — Ambulatory Visit (INDEPENDENT_AMBULATORY_CARE_PROVIDER_SITE_OTHER): Payer: Medicare HMO | Admitting: Family Medicine

## 2017-03-28 ENCOUNTER — Encounter: Payer: Self-pay | Admitting: Family Medicine

## 2017-03-28 VITALS — BP 132/78 | HR 91 | Resp 16 | Ht 68.0 in | Wt 163.0 lb

## 2017-03-28 DIAGNOSIS — F5104 Psychophysiologic insomnia: Secondary | ICD-10-CM

## 2017-03-28 DIAGNOSIS — R918 Other nonspecific abnormal finding of lung field: Secondary | ICD-10-CM

## 2017-03-28 DIAGNOSIS — E038 Other specified hypothyroidism: Secondary | ICD-10-CM

## 2017-03-28 DIAGNOSIS — E559 Vitamin D deficiency, unspecified: Secondary | ICD-10-CM | POA: Diagnosis not present

## 2017-03-28 DIAGNOSIS — I10 Essential (primary) hypertension: Secondary | ICD-10-CM | POA: Diagnosis not present

## 2017-03-28 DIAGNOSIS — F32A Depression, unspecified: Secondary | ICD-10-CM

## 2017-03-28 DIAGNOSIS — F419 Anxiety disorder, unspecified: Secondary | ICD-10-CM

## 2017-03-28 DIAGNOSIS — F17218 Nicotine dependence, cigarettes, with other nicotine-induced disorders: Secondary | ICD-10-CM | POA: Diagnosis not present

## 2017-03-28 DIAGNOSIS — E785 Hyperlipidemia, unspecified: Secondary | ICD-10-CM | POA: Diagnosis not present

## 2017-03-28 DIAGNOSIS — F329 Major depressive disorder, single episode, unspecified: Secondary | ICD-10-CM | POA: Diagnosis not present

## 2017-03-28 MED ORDER — DULOXETINE HCL 60 MG PO CPEP: 60.0000 mg | ORAL_CAPSULE | Freq: Every day | ORAL | 3 refills | Status: DC

## 2017-03-28 NOTE — Patient Instructions (Addendum)
Wellness visit as before  MD follow up end August    Fasting lipid, cmp and EGFr, TSH and vit D this week at [facility] are referred to telepsychiatry you will be called  Need to stop smoking, to reduce heart disease , stroke and cancer risk 1800 QUIT NOW to  Help you quit     Steps to Quit Smoking Smoking tobacco can be bad for your health. It can also affect almost every organ in your body. Smoking puts you and people around you at risk for many serious long-lasting (chronic) diseases. Quitting smoking is hard, but it is one of the best things that you can do for your health. It is never too late to quit. What are the benefits of quitting smoking? When you quit smoking, you lower your risk for getting serious diseases and conditions. They can include:  Lung cancer or lung disease.  Heart disease.  Stroke.  Heart attack.  Not being able to have children (infertility).  Weak bones (osteoporosis) and broken bones (fractures).  If you have coughing, wheezing, and shortness of breath, those symptoms may get better when you quit. You may also get sick less often. If you are pregnant, quitting smoking can help to lower your chances of having a baby of low birth weight. What can I do to help me quit smoking? Talk with your doctor about what can help you quit smoking. Some things you can do (strategies) include:  Quitting smoking totally, instead of slowly cutting back how much you smoke over a period of time.  Going to in-person counseling. You are more likely to quit if you go to many counseling sessions.  Using resources and support systems, such as: ? Database administrator with a Social worker. ? Phone quitlines. ? Careers information officer. ? Support groups or group counseling. ? Text messaging programs. ? Mobile phone apps or applications.  Taking medicines. Some of these medicines may have nicotine in them. If you are pregnant or breastfeeding, do not take any medicines to  quit smoking unless your doctor says it is okay. Talk with your doctor about counseling or other things that can help you.  Talk with your doctor about using more than one strategy at the same time, such as taking medicines while you are also going to in-person counseling. This can help make quitting easier. What things can I do to make it easier to quit? Quitting smoking might feel very hard at first, but there is a lot that you can do to make it easier. Take these steps:  Talk to your family and friends. Ask them to support and encourage you.  Call phone quitlines, reach out to support groups, or work with a Social worker.  Ask people who smoke to not smoke around you.  Avoid places that make you want (trigger) to smoke, such as: ? Bars. ? Parties. ? Smoke-break areas at work.  Spend time with people who do not smoke.  Lower the stress in your life. Stress can make you want to smoke. Try these things to help your stress: ? Getting regular exercise. ? Deep-breathing exercises. ? Yoga. ? Meditating. ? Doing a body scan. To do this, close your eyes, focus on one area of your body at a time from head to toe, and notice which parts of your body are tense. Try to relax the muscles in those areas.  Download or buy apps on your mobile phone or tablet that can help you stick to your  quit plan. There are many free apps, such as QuitGuide from the [employer] Office manager for Disease Control and Prevention). You can find more support from smokefree.gov and other websites.  This information is not intended to replace advice given to you by your health care provider. Make sure you discuss any questions you have with your health care provider. Document Released: 10/30/2008 Document Revised: 09/01/2015 Document Reviewed: 05/20/2014 Elsevier Interactive Patient Education  2018 [employer].

## 2017-03-28 NOTE — Progress Notes (Signed)
Kelly Douglas     MRN: [id number]      DOB: 1955-08-01   HPI Kelly Douglas is here for follow up and re-evaluation of chronic medical conditions, medication management and review of any available recent lab and radiology data.  Preventive health is updated, specifically  Cancer screening and Immunization.   Questions or concerns regarding consultations or procedures which the PT has had in the interim are  addressed. The PT denies any adverse reactions to current medications since the last visit.  Increased anxiety and poor impulse control in the last 2 weeks, 3 deaths in last 2 weeks and having problems with relationships with family members and close friends   ROS Denies recent fever or chills. Denies sinus pressure, nasal congestion, ear pain or sore throat. Denies chest congestion, productive cough or wheezing. Denies chest pains, palpitations and leg swelling Denies abdominal pain, nausea, vomiting,diarrhea or constipation.   Denies dysuria, frequency, hesitancy or incontinence. C/o joint pain,  and limitation in mobility. Denies headaches, seizures, numbness, or tingling.  Denies skin break down or rash.   PE  BP 132/78   Pulse 91   Resp 16   Ht 5\' 8"  (1.727 m)   Wt 163 lb (73.9 kg)   SpO2 95%   BMI 24.78 kg/m   Patient alert and oriented and in no cardiopulmonary distress.  HEENT: No facial asymmetry, EOMI,   oropharynx pink and moist.  Neck supple no JVD, no mass.  Chest: Clear to auscultation bilaterally.decreased air entry CVS: S1, S2 no murmurs, no S3.Regular rate.  ABD: Soft non tender.   Ext: No edema  MS: Decreased  ROM spine, shoulders, hips and knees.  Skin: Intact, no ulcerations or rash noted.  Psych: Good eye contact, normal affect. Memory intact anxious not  depressed appearing.  CNS: CN 2-12 intact, power,  normal throughout.no focal deficits noted.   Assessment & Plan  Anxiety and depression Uncontrolled, not suicidal or homicidal but  not getting along with most family members including her 2 sons Will benefit from telepsych.will refer   Hypothyroid Controlled, no change in medication   Hyperlipidemia Hyperlipidemia:Low fat diet discussed and encouraged.   Lipid Panel  Lab Results  Component Value Date   CHOL 144 03/29/2017   HDL 39 (L) 03/29/2017   LDLCALC 81 03/29/2017   TRIG 120 03/29/2017   CHOLHDL 3.7 03/29/2017    Increased activity encouraged   Essential hypertension Controlled, no change in medication DASH diet and commitment to daily physical activity for a minimum of 30 minutes discussed and encouraged, as a part of hypertension management. The importance of attaining a healthy weight is also discussed.  BP/Weight 03/28/2017 12/02/2016 11/28/2016 11/18/2016 10/04/2016 08/23/2016 9/32/3557  Systolic BP 322 025 427 062 376 283 151  Diastolic BP 78 82 76 70 78 78 84  Wt. (Lbs) 163 159 163 162 158 154 158  BMI 24.78 24.18 24.78 24.63 24.02 23.42 24.02       Insomnia Sleep hygiene reviewed and written information offered also. Reports poor sleep associated with increased anxiety with cent deaths, thinks this over now, will follow.   Nicotine dependence Asked: confirms current cigarette smoking, 10 /day Assess: unwilling to quit now  states nerves out of control, but knows she should Advised:need to quit will reduce chance of lung failure and need for supplemental oxygen, already has difficulty breathing, and lung nodules, also risk of heart disease and stroke already has vascular disease, also risk of all types of  cancer Arranged : community resources provided, smoking cessation classes available , also 1800 QUIT NOW F/U in4 months Time spent 5 mins  Pulmonary nodules Followed by pulmonary, 10/2016 scan report is that she may be a  Surgical candidate, close pulmonary f/u arranged

## 2017-03-29 ENCOUNTER — Telehealth: Payer: Self-pay

## 2017-03-29 DIAGNOSIS — E559 Vitamin D deficiency, unspecified: Secondary | ICD-10-CM | POA: Diagnosis not present

## 2017-03-29 DIAGNOSIS — F419 Anxiety disorder, unspecified: Principal | ICD-10-CM

## 2017-03-29 DIAGNOSIS — I10 Essential (primary) hypertension: Secondary | ICD-10-CM | POA: Diagnosis not present

## 2017-03-29 DIAGNOSIS — F329 Major depressive disorder, single episode, unspecified: Secondary | ICD-10-CM

## 2017-03-29 DIAGNOSIS — E785 Hyperlipidemia, unspecified: Secondary | ICD-10-CM | POA: Diagnosis not present

## 2017-03-29 NOTE — BH Specialist Note (Signed)
[place] Telephone Follow-up  MRN: [id number] NAME: Kelly Douglas Date: 03/29/17  Start time: 3pm - 3:40pm  Total time: 40 minutes Call number: 1/6  Reason for call today: Reason for Contact: Initial Assessment  PHQ-9 Scores:  Depression screen [facility] 2/9 03/29/2017 03/28/2017 03/01/2016 01/28/2016 05/19/2015  Decreased Interest 1 1 0 0 0  Down, Depressed, Hopeless 2 0 0 0 0  PHQ - 2 Score 3 1 0 0 0  Altered sleeping 3 0 - - -  Tired, decreased energy 2 0 - - -  Change in appetite 1 0 - - -  Feeling bad or failure about yourself  2 0 - - -  Trouble concentrating 1 0 - - -  Moving slowly or fidgety/restless 0 0 - - -  Suicidal thoughts 0 - - - -  PHQ-9 Score 12 1 - - -  Difficult doing work/chores - - - - -   GAD-7 Scores:  GAD 7 : Generalized Anxiety Score 03/29/2017 03/28/2017  Nervous, Anxious, on Edge 3 3  Control/stop worrying 3 2  Worry too much - different things 2 3  Trouble relaxing 3 3  Restless 2 3  Easily annoyed or irritable 0 3  Afraid - awful might happen 0 0  Total GAD 7 Score 13 17  Anxiety Difficulty - Very difficult    Stress Current stressors: Current Stressors: Family death, Grief/losses, Finances, Peer relationships Sleep: Sleep: Decreased, Difficulty falling asleep, Difficulty staying asleep Appetite: Appetite: Decreased, Loss of appetite Coping ability: Coping ability: Overwhelmed Patient taking medications as prescribed: Patient taking medications as prescribed: Yes  Current medications:  Outpatient Encounter Medications as of 03/29/2017  Medication Sig  . Acetaminophen (TYLENOL ARTHRITIS PAIN PO) Take 2 tablets by mouth 3 (three) times daily.  Marland Kitchen albuterol (PROVENTIL HFA;VENTOLIN HFA) 108 (90 BASE) MCG/ACT inhaler Inhale 2 puffs into the lungs every 6 (six) hours as needed. Shortness of breath  . amLODipine (NORVASC) 5 MG tablet TAKE 1 TABLET EVERY DAY  . busPIRone (BUSPAR) 10 MG tablet Take 1 tablet (10 mg total) 3 (three) times daily  by mouth.  . dicyclomine (BENTYL) 10 MG capsule TAKE 1 CAPSULE THREE TIMES DAILY WITH MEALS AS NEEDED FOR  SPASMS  . DULoxetine (CYMBALTA) 60 MG capsule Take 1 capsule (60 mg total) by mouth daily.  Marland Kitchen gabapentin (NEURONTIN) 600 MG tablet TAKE 1 TABLET TWICE DAILY  (DOSE  REDUCTION)  . levothyroxine (SYNTHROID, LEVOTHROID) 75 MCG tablet TAKE 1 TABLET EVERY DAY  . meloxicam (MOBIC) 7.5 MG tablet Take 1 tablet (7.5 mg total) daily by mouth. (Patient not taking: Reported on 03/28/2017)  . montelukast (SINGULAIR) 10 MG tablet TAKE 1 TABLET AT BEDTIME  . Multiple Vitamins-Calcium (ONE-A-DAY WOMENS PO) Take 1 tablet by mouth daily.  . pantoprazole (PROTONIX) 40 MG tablet TAKE 1 TABLET TWICE DAILY  . pravastatin (PRAVACHOL) 40 MG tablet TAKE 1 TABLET EVERY DAY  . temazepam (RESTORIL) 30 MG capsule Take 1 capsule (30 mg total) by mouth at bedtime.   No facility-administered encounter medications on file as of 03/29/2017.      Self-harm Behaviors Risk Assessment Self-harm risk factors:   Patient endorses recent thoughts of harming self: Have you recently had any thoughts about harming yourself?: No  [place] Suicide Severity Rating Scale: No flowsheet data found. No flowsheet data found.   Danger to Others Risk Assessment Danger to others risk factors: Danger to Others Risk Factors: No risk factors noted Patient endorses recent thoughts of harming  others: Notification required: No need or identified person  Dynamic Appraisal of Situational Aggression (DASA): No flowsheet data found.    Goals, Interventions and Follow-up Plan Goals: Increase healthy adjustment to current life circumstances and Begin healthy grieving over loss Interventions: Motivational Interviewing, Mindfulness or Psychologist, educational, Supportive Counseling and Sleep Hygiene Follow-up Plan: Connect with [employer] ( shelter, financial assistance, food sources, community activities) to assist with finding a dentist.    Summary:  Patient is a 62-yer old female married female.  Patient reports that her psychiatric medication is not working her.  Patient is not able to remember the name of the psychiatric medication.  Patient reports that she was diagnosed with Anxiety Disorder and she has been experiencing panic attacks weekly.     Patient reports inpatient psychiatric hospitalization after her mother died.  Patient reports that she cannot remember the year that her mother died.  Patient thinks that it was more than 10 years ago. Patient reports that her sleep is very poor.  Patient reports that she would go to bed at 4am and woke up at 12pm.  Patient reports that she is unable to stay asleep at night.  Patient reports that she is only able to sleep more than 5 hours at a time.   Patient reports that her appetite is poor because she eats at night and she does not like to eat around other people due to her teeth.  Patient reports that eating is hard for her due missing many teeth.   Patient reports that she has financial constraints and missing teeth that has been causing her depression and anxiety.  Patient reports that she has been worrying about her two sons that have moved out of the home.  Patient reports that she has always had a very close relationship with her children.  Patient reports that she has a strained relationship with her son's wife.   Patient reports that all she that were 10 siblings and now there are only five left. Patient reports that she went to the funeral for her cousin's husband last week and she went to the funeral for her neighbor's father the week before that.  Patient reports that caused her to continue to remember family members that she has lost.   Patient reports that she lives with her husband and they have been married for over 41 years. Patient reports that her husband is 78 years old.  Patient reports that she does not work but she receives disability check.  Patient reports that  she likes to crochet and play games on the computer.  Patient report that her computer no longer works.   Graciella Freer [place], LCAS-A

## 2017-03-30 ENCOUNTER — Encounter: Payer: Self-pay | Admitting: Family Medicine

## 2017-03-30 ENCOUNTER — Other Ambulatory Visit: Payer: Self-pay | Admitting: Family Medicine

## 2017-03-30 LAB — LIPID PANEL
Chol/HDL Ratio: 3.7 ratio (ref 0.0–4.4)
Cholesterol, Total: 144 mg/dL (ref 100–199)
HDL: 39 mg/dL — ABNORMAL LOW (ref >39)
LDL CALC: 81 mg/dL (ref 0–99)
Triglycerides: 120 mg/dL (ref 0–149)
VLDL CHOLESTEROL CAL: 24 mg/dL (ref 5–40)

## 2017-03-30 LAB — CMP14+EGFR
ALBUMIN: 4.6 g/dL (ref 3.6–4.8)
ALK PHOS: 113 IU/L (ref 39–117)
ALT: 10 IU/L (ref 0–32)
AST: 14 IU/L (ref 0–40)
Albumin/Globulin Ratio: 1.5 (ref 1.2–2.2)
BUN/Creatinine Ratio: 11 — ABNORMAL LOW (ref 12–28)
BUN: 9 mg/dL (ref 8–27)
Bilirubin Total: 0.5 mg/dL (ref 0.0–1.2)
CO2: 25 mmol/L (ref 20–29)
CREATININE: 0.83 mg/dL (ref 0.57–1.00)
Calcium: 9.4 mg/dL (ref 8.7–10.3)
Chloride: 100 mmol/L (ref 96–106)
GFR calc Af Amer: 88 mL/min/{1.73_m2} (ref >59)
GFR calc non Af Amer: 76 mL/min/{1.73_m2} (ref >59)
GLUCOSE: 87 mg/dL (ref 65–99)
Globulin, Total: 3 g/dL (ref 1.5–4.5)
Potassium: 3.3 mmol/L — ABNORMAL LOW (ref 3.5–5.2)
Sodium: 144 mmol/L (ref 134–144)
Total Protein: 7.6 g/dL (ref 6.0–8.5)

## 2017-03-30 LAB — TSH: TSH: 2.55 u[IU]/mL (ref 0.450–4.500)

## 2017-03-30 LAB — VITAMIN D 25 HYDROXY (VIT D DEFICIENCY, FRACTURES): VIT D 25 HYDROXY: 50.7 ng/mL (ref 30.0–100.0)

## 2017-03-30 MED ORDER — POTASSIUM CHLORIDE ER 10 MEQ PO TBCR: 10.0000 meq | EXTENDED_RELEASE_TABLET | Freq: Every day | ORAL | 5 refills | Status: DC

## 2017-03-30 NOTE — Progress Notes (Signed)
klor con  

## 2017-04-03 ENCOUNTER — Telehealth: Payer: Self-pay | Admitting: Family Medicine

## 2017-04-03 ENCOUNTER — Encounter: Payer: Self-pay | Admitting: Family Medicine

## 2017-04-03 DIAGNOSIS — M349 Systemic sclerosis, unspecified: Secondary | ICD-10-CM | POA: Diagnosis not present

## 2017-04-03 DIAGNOSIS — I2510 Atherosclerotic heart disease of native coronary artery without angina pectoris: Secondary | ICD-10-CM | POA: Diagnosis not present

## 2017-04-03 DIAGNOSIS — J449 Chronic obstructive pulmonary disease, unspecified: Secondary | ICD-10-CM | POA: Diagnosis not present

## 2017-04-03 DIAGNOSIS — I10 Essential (primary) hypertension: Secondary | ICD-10-CM | POA: Diagnosis not present

## 2017-04-03 NOTE — Assessment & Plan Note (Signed)
Controlled, no change in medication  

## 2017-04-03 NOTE — Assessment & Plan Note (Signed)
Controlled, no change in medication DASH diet and commitment to daily physical activity for a minimum of 30 minutes discussed and encouraged, as a part of hypertension management. The importance of attaining a healthy weight is also discussed.  BP/Weight 03/28/2017 12/02/2016 11/28/2016 11/18/2016 10/04/2016 08/23/2016 9/31/1216  Systolic BP 244 695 072 257 505 183 358  Diastolic BP 78 82 76 70 78 78 84  Wt. (Lbs) 163 159 163 162 158 154 158  BMI 24.78 24.18 24.78 24.63 24.02 23.42 24.02

## 2017-04-03 NOTE — Assessment & Plan Note (Signed)
Followed by pulmonary, 10/2016 scan report is that she may be a  Surgical candidate, close pulmonary f/u arranged

## 2017-04-03 NOTE — Assessment & Plan Note (Signed)
Sleep hygiene reviewed and written information offered also. Reports poor sleep associated with increased anxiety with cent deaths, thinks this over now, will follow.

## 2017-04-03 NOTE — Telephone Encounter (Signed)
Patient left a message stating her most recent medication was [facility]. She needs it sent to [facility] Cb#: [phone]

## 2017-04-03 NOTE — Assessment & Plan Note (Signed)
Hyperlipidemia:Low fat diet discussed and encouraged.   Lipid Panel  Lab Results  Component Value Date   CHOL 144 03/29/2017   HDL 39 (L) 03/29/2017   LDLCALC 81 03/29/2017   TRIG 120 03/29/2017   CHOLHDL 3.7 03/29/2017    Increased activity encouraged

## 2017-04-03 NOTE — Assessment & Plan Note (Signed)
Uncontrolled, not suicidal or homicidal but not getting along with most family members including her 2 sons Will benefit from telepsych.will refer

## 2017-04-03 NOTE — Assessment & Plan Note (Addendum)
Asked: confirms current cigarette smoking, 10 /day Assess: unwilling to quit now  states nerves out of control, but knows she should Advised:need to quit will reduce chance of lung failure and need for supplemental oxygen, already has difficulty breathing, and lung nodules, also risk of heart disease and stroke already has vascular disease, also risk of all types of cancer Arranged : community resources provided, smoking cessation classes available , also 1800 QUIT NOW F/U in4 months Time spent 5 mins

## 2017-04-04 ENCOUNTER — Other Ambulatory Visit: Payer: Self-pay

## 2017-04-04 ENCOUNTER — Encounter (HOSPITAL_COMMUNITY): Payer: Self-pay | Admitting: Psychiatry

## 2017-04-04 MED ORDER — POTASSIUM CHLORIDE ER 10 MEQ PO TBCR: 10.0000 meq | EXTENDED_RELEASE_TABLET | Freq: Every day | ORAL | 1 refills | Status: DC

## 2017-04-04 NOTE — Telephone Encounter (Signed)
Done

## 2017-04-05 ENCOUNTER — Telehealth: Payer: Self-pay | Admitting: Family Medicine

## 2017-04-05 NOTE — Telephone Encounter (Signed)
This encounter was created in error - please disregard.

## 2017-04-05 NOTE — Telephone Encounter (Signed)
Please move rx from [facility] to [facility]

## 2017-04-05 NOTE — Progress Notes (Signed)
[facility] Psychiatric Consultant Case Review   Summary Kelly Douglas is a 62 y.o. year old female with history of depression, anxiety, hypothyroidism, hyperlipidemia, hypertension, GERD, CAD,  scleroderma per chart. Patient complains of worsening anxiety and appetite loss secondary to teeth issues and insomnia  Psychosocial factors: two sons had moved out of home, unemployed (on disability), 3 deaths in last two weeks, which reminds her of her deceased family   Current Medications Current Outpatient Medications on File Prior to Visit  Medication Sig Dispense Refill  . Acetaminophen (TYLENOL ARTHRITIS PAIN PO) Take 2 tablets by mouth 3 (three) times daily.    Marland Kitchen albuterol (PROVENTIL HFA;VENTOLIN HFA) 108 (90 BASE) MCG/ACT inhaler Inhale 2 puffs into the lungs every 6 (six) hours as needed. Shortness of breath 6.7 g 3  . amLODipine (NORVASC) 5 MG tablet TAKE 1 TABLET EVERY DAY 90 tablet 1  . busPIRone (BUSPAR) 10 MG tablet Take 1 tablet (10 mg total) 3 (three) times daily by mouth. 270 tablet 1  . dicyclomine (BENTYL) 10 MG capsule TAKE 1 CAPSULE THREE TIMES DAILY WITH MEALS AS NEEDED FOR  SPASMS 120 capsule 3  . DULoxetine (CYMBALTA) 60 MG capsule Take 1 capsule (60 mg total) by mouth daily. 90 capsule 3  . gabapentin (NEURONTIN) 600 MG tablet TAKE 1 TABLET TWICE DAILY  (DOSE  REDUCTION) 180 tablet 3  . levothyroxine (SYNTHROID, LEVOTHROID) 75 MCG tablet TAKE 1 TABLET EVERY DAY 90 tablet 1  . meloxicam (MOBIC) 7.5 MG tablet Take 1 tablet (7.5 mg total) daily by mouth. (Patient not taking: Reported on 03/28/2017) 180 tablet 5  . montelukast (SINGULAIR) 10 MG tablet TAKE 1 TABLET AT BEDTIME 90 tablet 1  . Multiple Vitamins-Calcium (ONE-A-DAY WOMENS PO) Take 1 tablet by mouth daily.    . pantoprazole (PROTONIX) 40 MG tablet TAKE 1 TABLET TWICE DAILY 180 tablet 5  . potassium chloride (K-DUR) 10 MEQ tablet Take 1 tablet (10 mEq total) by mouth daily. 180 tablet 1   . pravastatin (PRAVACHOL) 40 MG tablet TAKE 1 TABLET EVERY DAY 90 tablet 1  . temazepam (RESTORIL) 30 MG capsule Take 1 capsule (30 mg total) by mouth at bedtime. 90 capsule 1   No current facility-administered medications on file prior to visit.      Past psychiatry history Outpatient:  Psychiatry admission: admitted more than 10 years ago in the setting of los of her mother Previous suicide attempt: no recent episode Past trials of medication: buspar, temazepam,  History of violence: no recent episode  Current measures Depression screen [facility] 2/9 03/29/2017 03/28/2017 03/01/2016 01/28/2016 05/19/2015  Decreased Interest 1 1 0 0 0  Down, Depressed, Hopeless 2 0 0 0 0  PHQ - 2 Score 3 1 0 0 0  Altered sleeping 3 0 - - -  Tired, decreased energy 2 0 - - -  Change in appetite 1 0 - - -  Feeling bad or failure about yourself  2 0 - - -  Trouble concentrating 1 0 - - -  Moving slowly or fidgety/restless 0 0 - - -  Suicidal thoughts 0 - - - -  PHQ-9 Score 12 1 - - -  Difficult doing work/chores - - - - -  Some recent data might be hidden   GAD 7 : Generalized Anxiety Score 03/29/2017 03/28/2017  Nervous, Anxious, on Edge 3 3  Control/stop worrying 3 2  Worry too much - different things 2 3  Trouble relaxing 3 3  Restless 2 3  Easily annoyed or irritable 0 3  Afraid - awful might happen 0 0  Total GAD 7 Score 13 17  Anxiety Difficulty - Very difficult    Goals (patient centered) Increase healthy adjustment to current life circumstances and Begin healthy grieving over loss  Assessment/Provisional Diagnosis # MDD, moderate, recurrent without psychotic features # Unspecified anxiety disorder Consider adding mirtazapine as adjunctive treatment for depression/anxiety and insomnia/appetite loss. Please discuss the risk of serotonin syndrome. [place] specialist will provide available resources for dental care given the patient attributes anxiety to her dental condition/appetite loss.    Recommendation - Start mirtazapine 7.5 mg at night for one week, then 15 mg at night  - Continue duloxetine 60 mg daily for depression - Continue buspar 10 mg TID for anxiety  - Patient is on temazepam 30 mg qhs (on gabapentin 600 mg BID) - [place] specialist to provide available resources for dental care - [place] specialist to work on sleep hygiene, supportive therapy for losses  Thank you for your consult. We will continue to follow the patient. Please contact [place]  for any questions or concerns.   The above treatment considerations and suggestions are based on consultation with the [facility] specialist and/or PCP and a review of information available in the shared registry and the patient's [place] Record (EHR). I have not personally examined the patient. All recommendations should be implemented with consideration of the patient's relevant prior history and current clinical status. Please feel free to call me with any questions about the care of this patient.

## 2017-04-05 NOTE — Telephone Encounter (Signed)
Med resent 

## 2017-04-10 ENCOUNTER — Ambulatory Visit: Payer: Medicare HMO

## 2017-04-10 NOTE — Telephone Encounter (Signed)
Pt stopped by to get the rx sent in to the pharmacy [facility], she received a lower COPAY thru the insurance, and can afford it now.. Can you send this to Kelly Douglas so she can Escribe the medicine

## 2017-04-10 NOTE — Telephone Encounter (Signed)
Patient requesting Kelly Douglas be sent to [facility]. I didn't see it on her active list. Has been prescribed by GI and U in 2018. Ok to send in?

## 2017-04-11 ENCOUNTER — Other Ambulatory Visit: Payer: Self-pay

## 2017-04-11 MED ORDER — CHOLESTYRAMINE 4 G PO PACK: PACK | ORAL | 3 refills | Status: DC

## 2017-04-11 NOTE — Telephone Encounter (Signed)
This has been done.

## 2017-04-11 NOTE — Telephone Encounter (Signed)
oK to send in

## 2017-04-20 ENCOUNTER — Other Ambulatory Visit: Payer: Self-pay

## 2017-04-20 MED ORDER — CHOLESTYRAMINE 4 G PO PACK: PACK | ORAL | 3 refills | Status: DC

## 2017-04-25 ENCOUNTER — Other Ambulatory Visit: Payer: Self-pay

## 2017-04-25 MED ORDER — BUSPIRONE HCL 10 MG PO TABS
10.0000 mg | ORAL_TABLET | Freq: Three times a day (TID) | ORAL | 1 refills | Status: DC
Start: 2017-04-25 — End: 2017-12-05

## 2017-04-26 ENCOUNTER — Other Ambulatory Visit: Payer: Self-pay | Admitting: Orthopedic Surgery

## 2017-05-02 ENCOUNTER — Telehealth: Payer: Self-pay

## 2017-05-02 ENCOUNTER — Encounter: Payer: Self-pay | Admitting: Internal Medicine

## 2017-05-02 ENCOUNTER — Ambulatory Visit (INDEPENDENT_AMBULATORY_CARE_PROVIDER_SITE_OTHER): Payer: Medicare HMO | Admitting: Internal Medicine

## 2017-05-02 ENCOUNTER — Other Ambulatory Visit: Payer: Self-pay

## 2017-05-02 VITALS — BP 136/83 | HR 99 | Temp 97.4°F | Ht 69.0 in | Wt 156.6 lb

## 2017-05-02 DIAGNOSIS — K580 Irritable bowel syndrome with diarrhea: Secondary | ICD-10-CM | POA: Diagnosis not present

## 2017-05-02 DIAGNOSIS — R152 Fecal urgency: Secondary | ICD-10-CM | POA: Diagnosis not present

## 2017-05-02 DIAGNOSIS — R159 Full incontinence of feces: Secondary | ICD-10-CM | POA: Diagnosis not present

## 2017-05-02 DIAGNOSIS — K219 Gastro-esophageal reflux disease without esophagitis: Secondary | ICD-10-CM

## 2017-05-02 MED ORDER — HYOSCYAMINE SULFATE 0.125 MG SL SUBL: 0.1250 mg | SUBLINGUAL_TABLET | Freq: Three times a day (TID) | SUBLINGUAL | 0 refills | Status: DC

## 2017-05-02 NOTE — Progress Notes (Signed)
Primary Care Physician:  [name], MD Primary Gastroenterologist:  Dr. Gala Romney  Pre-Procedure History & Physical: HPI:  Kelly Douglas is a 62 y.o. female here for abdominal cramps diarrhea-occasional incontinence. GERD.  GERD well-controlled on Protonix 40 mg once daily  [facility] helped with her bowel symptoms, however,  too expensive  - can't afford it. Uses Bentyl when necessary. States she only has about 2 episodes of abdominal cramps and loose stools weekly. Other times, she states she does fine. No bleeding. History of colonic adenoma-duesurveillance colonoscopy 2021.  Past Medical History:  Diagnosis Date  . Anxiety   . Arteriosclerotic cardiovascular disease (ASCVD) 2013   coronary calcification-left main, LAD and CX; small pericardial effusion  . Arthritis   . Chronic back pain   . Chronic bronchitis   . Chronic lung disease    Chronic scarring and volume loss-left lung; characteristics of a chronic infectious process-possible MAI  . Depression   . GERD (gastroesophageal reflux disease)    Dr Gala Romney EGD 09/2009->esophagitis, sm HH, antral erosions, atonic esophagus  . Hyperlipidemia   . Hypothyroid 1981 approx  . Scleroderma ([place])   . Seasonal allergies   . Syncope    Multiple spells over the past 40+ years, likely neurocardiogenic  . Tobacco abuse 06/24/2009    Past Surgical History:  Procedure Laterality Date  . BILATERAL SALPINGOOPHORECTOMY  2013    Dr. Elonda Husky; uterus remains in situ  . COLONOSCOPY  09/28/09   anal papilla otherwise normal  . COLONOSCOPY N/A 08/29/2012   [id number] polyp-removed as described above. tubular adenoma  . ESOPHAGOGASTRODUODENOSCOPY  10/07/09   Dr. Barrie Dunker of esophageal mucosa, diffusely ?esophagitis (bx benign), small HH/antral erosions, erythema bx benign. 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 ESOPHAGOGASTRODUODENOSCOPY (EGD) WITH ESOPHAGEAL DILATION N/A 03/07/2012   [id number], patent, tubular esophagus  of uncertain significance-status post biopsy )unremarkable). Hiatal hernia  . LAPAROSCOPIC APPENDECTOMY  07/16/2011   Procedure: APPENDECTOMY LAPAROSCOPIC;  Surgeon: Donato Heinz, MD;  Location: [facility];  Service: General;  Laterality: N/A;  . TUBAL LIGATION      Prior to Admission medications   Medication Sig Start Date End Date Taking? Authorizing Provider  Acetaminophen (TYLENOL ARTHRITIS PAIN PO) Take 2 tablets by mouth 3 (three) times daily.   Yes [provider]  albuterol (PROVENTIL HFA;VENTOLIN HFA) 108 (90 BASE) MCG/ACT inhaler Inhale 2 puffs into the lungs every 6 (six) hours as needed. Shortness of breath 08/02/12  Yes [name], MD  amLODipine (NORVASC) 5 MG tablet TAKE 1 TABLET EVERY DAY 02/15/17  Yes [name], MD  busPIRone (BUSPAR) 10 MG tablet Take 1 tablet (10 mg total) by mouth 3 (three) times daily. 04/25/17  Yes [name], MD  dicyclomine (BENTYL) 10 MG capsule TAKE 1 CAPSULE THREE TIMES DAILY WITH MEALS AS NEEDED FOR  SPASMS 05/17/16  Yes [name], MD  DULoxetine (CYMBALTA) 60 MG capsule Take 1 capsule (60 mg total) by mouth daily. 03/28/17  Yes [name], MD  gabapentin (NEURONTIN) 600 MG tablet TAKE 1 TABLET TWICE DAILY  (DOSE  REDUCTION) 09/02/16  Yes [name], MD  levothyroxine (SYNTHROID, LEVOTHROID) 75 MCG tablet TAKE 1 TABLET EVERY DAY Patient taking differently: ALTERNATES WITH 1 TAB AND 1/2 TABLET EVERY DAY 02/23/17  Yes [name], MD  meloxicam (MOBIC) 7.5 MG tablet Take 1 tablet (7.5 mg total) daily by mouth. 12/02/16  Yes Carole Civil, MD  montelukast (SINGULAIR) 10 MG tablet TAKE 1  TABLET AT BEDTIME 02/15/17  Yes [name], MD  Multiple Vitamins-Calcium (ONE-A-DAY WOMENS PO) Take 1 tablet by mouth daily.   Yes [provider]  pantoprazole (PROTONIX) 40 MG tablet TAKE 1 TABLET TWICE DAILY 05/10/16  Yes Annitta Needs, NP  potassium chloride (K-DUR) 10 MEQ tablet Take  1 tablet (10 mEq total) by mouth daily. 04/04/17  Yes [name], MD  pravastatin (PRAVACHOL) 40 MG tablet TAKE 1 TABLET EVERY DAY 02/15/17  Yes [name], MD  temazepam (RESTORIL) 30 MG capsule Take 1 capsule (30 mg total) by mouth at bedtime. 12/27/16  Yes [name], MD  traMADol (ULTRAM) 50 MG tablet TAKE 1 TABLET BY MOUTH EVERY 6 HOURS AS NEEDED 04/26/17  Yes Carole Civil, MD  cholestyramine (QUESTRAN) 4 g packet 1 PACKET DAILY. DO NOT TAKE WITHIN 2 HOURS OF OTHER MEDICATIONS Patient not taking: Reported on 05/02/2017 04/20/17   [name], MD    Allergies as of 05/02/2017 - Review Complete 05/02/2017  Allergen Reaction Noted  . Aspirin Other (See Comments) 05/19/2015    Family History  Problem Relation Age of Onset  . Stroke Father   . Aneurysm Father   . Coronary artery disease Brother   . Hypertension Brother   . Down syndrome Brother   . Hypertension Brother   . Hypertension Brother        father/mother  . Aneurysm Mother   . Throat cancer Brother 49       throat cancer  . Hypertension Sister   . Diabetes Brother   . Diabetes Brother   . Anesthesia problems Neg Hx   . Hypotension Neg Hx   . Malignant hyperthermia Neg Hx   . Pseudochol deficiency Neg Hx   . Colon cancer Neg Hx     Social History   Socioeconomic History  . Marital status: Married    Spouse name: Not on file  . Number of children: 2  . Years of education: Not on file  . Highest education level: Not on file  Occupational History  . Occupation: disabled    Fish farm manager: NOT EMPLOYED  Social Needs  . Financial resource strain: Not on file  . Food insecurity:    Worry: Not on file    Inability: Not on file  . Transportation needs:    Medical: Not on file    Non-medical: Not on file  Tobacco Use  . Smoking status: Current Every Day Smoker    Packs/day: 0.50    Years: 45.00    Pack years: 22.50    Types: Cigarettes    Start date: 07/14/1971  . Smokeless  tobacco: Never Used  . Tobacco comment: 0.5 PPD current plans to reduce   Substance and Sexual Activity  . Alcohol use: No    Alcohol/week: 0.0 oz    Comment: quit June 2012-used to drink 6 pack daily  . Drug use: No  . Sexual activity: Yes    Birth control/protection: Post-menopausal  Lifestyle  . Physical activity:    Days per week: Not on file    Minutes per session: Not on file  . Stress: Not on file  Relationships  . Social connections:    Talks on phone: Not on file    Gets together: Not on file    Attends religious service: Not on file    Active member of club or organization: Not on file    Attends meetings of clubs or organizations: Not on  file    Relationship status: Not on file  . Intimate partner violence:    Fear of current or ex partner: Not on file    Emotionally abused: Not on file    Physically abused: Not on file    Forced sexual activity: Not on file  Other Topics Concern  . Not on file  Social History Narrative  . Not on file    Review of Systems: See HPI, otherwise negative ROS  Physical Exam: BP 136/83   Pulse 99   Temp (!) 97.4 F (36.3 C) (Oral)   Ht 5\' 9"  (1.753 m)   Wt 156 lb 9.6 oz (71 kg)   BMI 23.13 kg/m  General:   Alert,  Well-developed, well-nourished, pleasant and cooperative in NAD Neck:  Supple; no masses or thyromegaly. No significant cervical adenopathy. Lungs:  Clear throughout to auscultation.   No wheezes, crackles, or rhonchi. No acute distress. Heart:  Regular rate and rhythm; no murmurs, clicks, rubs,  or gallops. Abdomen: Non-distended, normal bowel sounds.  Soft and nontender without appreciable mass or hepatosplenomegaly.   Impression:  Pleasant 62 year old lady with GERD well-controlled on Protonix 40 mg daily. History of colonic adenoma; do surveillance 2021.  She has about 2 episodes, cramping diarrhea sometimes incontinence weekly. Could not afford [facility]. I suspect she has a mix of fecal incontinence and  irritable bowel syndrome.  Recommendations:  Stop Bentyl  Trial of Levsin .125mg  - one under the tongue before meals and at bedtime as needed for abdominal cramps and loose stools (disp 60 with 3 refills - [facility])  Continue Protonix 40 mg daily  Office visit in 8 weeks       Notice: This dictation was prepared with Dragon dictation along with smaller phrase technology. Any transcriptional errors that result from this process are unintentional and may not be corrected upon review.

## 2017-05-02 NOTE — Telephone Encounter (Signed)
Opened in error

## 2017-05-02 NOTE — Patient Instructions (Addendum)
Stop Bentyl  Trial of Levsin .125mg  - one under the tongue before meals and at bedtime as needed for abdominal cramps and loose stools (disp 60 with 3 refills - [facility])  Continue Protonix 40 mg daily  Office visit in 8 weeks

## 2017-05-10 ENCOUNTER — Telehealth: Payer: Self-pay

## 2017-05-10 NOTE — Telephone Encounter (Signed)
[place] voice mail message on 04-25-2017 and 05-10-2017

## 2017-05-17 ENCOUNTER — Other Ambulatory Visit: Payer: Medicare HMO

## 2017-05-18 ENCOUNTER — Ambulatory Visit (HOSPITAL_COMMUNITY)
Admission: RE | Admit: 2017-05-18 | Discharge: 2017-05-18 | Disposition: A | Discharge status: Home / Self Care | Payer: Medicare HMO | Source: Ambulatory Visit | Attending: Emergency Medicine | Admitting: Emergency Medicine

## 2017-05-18 DIAGNOSIS — I700 Atherosclerosis of aorta: Secondary | ICD-10-CM | POA: Diagnosis not present

## 2017-05-18 DIAGNOSIS — R911 Solitary pulmonary nodule: Secondary | ICD-10-CM | POA: Insufficient documentation

## 2017-05-18 DIAGNOSIS — J439 Emphysema, unspecified: Secondary | ICD-10-CM | POA: Insufficient documentation

## 2017-05-18 DIAGNOSIS — R918 Other nonspecific abnormal finding of lung field: Secondary | ICD-10-CM | POA: Diagnosis not present

## 2017-06-07 ENCOUNTER — Other Ambulatory Visit: Payer: Self-pay | Admitting: Family Medicine

## 2017-06-07 ENCOUNTER — Telehealth: Payer: Self-pay | Admitting: Family Medicine

## 2017-06-07 ENCOUNTER — Other Ambulatory Visit: Payer: Self-pay | Admitting: Gastroenterology

## 2017-06-07 MED ORDER — CEPHALEXIN 500 MG PO CAPS: 500.0000 mg | ORAL_CAPSULE | Freq: Three times a day (TID) | ORAL | 0 refills | Status: DC

## 2017-06-07 NOTE — Telephone Encounter (Signed)
Pt has been in Pain for a week, she is swollen, has a abscess tooth.  I suggested that she call the Dentist. But she advised that she didn't have a dentist or the money for a dentist.

## 2017-06-07 NOTE — Progress Notes (Signed)
Keflex

## 2017-06-07 NOTE — Telephone Encounter (Signed)
Called pt back to let her know. Phone out of service

## 2017-06-07 NOTE — Telephone Encounter (Signed)
1 week keflex sent to [facility] please let her know

## 2017-06-08 ENCOUNTER — Other Ambulatory Visit: Payer: Self-pay | Admitting: Orthopedic Surgery

## 2017-06-23 ENCOUNTER — Telehealth: Payer: Self-pay | Admitting: Clinical

## 2017-06-23 NOTE — Telephone Encounter (Signed)
[place] Follow Up Assessment  Called pt's number [phone] and received the message "call could not be completed as dialed." Called pt's number [phone] and received the message "this number is no longer in service."   [place] Intern

## 2017-06-26 ENCOUNTER — Telehealth: Payer: Self-pay | Admitting: Acute Care

## 2017-06-26 NOTE — Telephone Encounter (Signed)
I have attempted to call Ms. Mclaurin with the results of her scan. There is a recording at her number that states Message GR22>> Number no longer in service.I have also tried the alternate home number, and there is a fast busy tone. I then called the contact number for [facility], who is listed as an emergency contact,  and there was a voice mail option. I left a message requesting that he ask Kelly Douglas to call the office for results. I left our contact number on the VM.  Langley Gauss, can you see if there is another number for contact other than the ones listed? Thanks so much.

## 2017-06-27 ENCOUNTER — Encounter: Payer: Self-pay | Admitting: Acute Care

## 2017-06-27 ENCOUNTER — Telehealth: Payer: Self-pay | Admitting: Acute Care

## 2017-06-27 NOTE — Telephone Encounter (Signed)
I have spoken with Mr. Kelly Douglas, regarding contacting Ms. [name]. He stated that he heard my message yesterday and spoke with [name]'s husband this morning. He told Ms. [name]'s husband to have the patient call the office at the contact number I had given Mr. Kelly Douglas in  Yesterday's ( 6/10.) message. He stated that their phones have been disconnected. I will send a certified letter to the patients home requesting she call for her results.

## 2017-06-28 ENCOUNTER — Telehealth: Payer: Self-pay | Admitting: Acute Care

## 2017-06-28 NOTE — Telephone Encounter (Signed)
I have returned patient's call. I explained that her CT showed an increase in the size of the  Pulmonary nodule that Dr. Lamonte Sakai has been monitoring. I explained that she needs to come in for an appointment to see Dr. Lamonte Sakai to discuss options for biopsy. I explained that our concern was that this could be cancer.She verbalized understanding. She currently does not have a phone , so please call to schedule appointment at [phone]. This is a neighbor who will walk her phone to the patient. Please schedule with Dr. Lamonte Sakai to discuss option as soon as possible. Thanks so much

## 2017-06-28 NOTE — Telephone Encounter (Signed)
Kelly Douglas, pt is calling you back regarding Super D Chest Ct results.

## 2017-06-29 NOTE — Telephone Encounter (Signed)
Attempted to contact pt at number provided by Judson Roch. I did not receive an answer. There was no option for me to leave a message. Will try back.

## 2017-06-30 NOTE — Telephone Encounter (Signed)
LM with pt sister to have her call back to schedule appt, pt was not available. She stated she would let the pt know and have her call us back.

## 2017-07-03 ENCOUNTER — Ambulatory Visit (INDEPENDENT_AMBULATORY_CARE_PROVIDER_SITE_OTHER): Payer: Medicare HMO

## 2017-07-03 VITALS — BP 150/74 | HR 100 | Temp 98.6°F | Resp 20 | Ht 68.0 in | Wt 151.1 lb

## 2017-07-03 DIAGNOSIS — Z1231 Encounter for screening mammogram for malignant neoplasm of breast: Secondary | ICD-10-CM

## 2017-07-03 DIAGNOSIS — Z0000 Encounter for general adult medical examination without abnormal findings: Secondary | ICD-10-CM | POA: Diagnosis not present

## 2017-07-03 DIAGNOSIS — Z1239 Encounter for other screening for malignant neoplasm of breast: Secondary | ICD-10-CM

## 2017-07-03 NOTE — Patient Instructions (Signed)
Kelly Douglas , Thank you for taking time to come for your Medicare Wellness Visit. I appreciate your ongoing commitment to your health goals. Please review the following plan we discussed and let me know if I can assist you in the future.   Screening recommendations/referrals: Colonoscopy: UTD Mammogram: UTD Bone Density: Not eligible for this yet Recommended yearly ophthalmology/optometry visit for glaucoma screening and checkup Recommended yearly dental visit for hygiene and checkup  Vaccinations: Influenza vaccine: Due Fall 2019 Pneumococcal vaccine: UTD Tdap vaccine: UTD Shingles vaccine: Call your insurance company    Advanced directives: You have been given information on this. Look over it, complete the paperwork, have it notarized, and make sure your family knows your wishes. Please bring a copy to [place] so that we can have it scanned into your chart.   Conditions/risks identified: These were discussed during your visit.  Next appointment: September 14, 2017 at 9:00 am with Dr.Simpson. Please call us if you need [place] before then.   Please schedule your mammogram appointment today before leaving. You would like this scheduled for August 14, 2017 around 3:30pm. The order is already in.  Preventive Care 40-64 Years, Female Preventive care refers to lifestyle choices and visits with your health care provider that can promote health and wellness. What does preventive care include?  A yearly physical exam. This is also called an annual well check.  Dental exams once or twice a year.  Routine eye exams. Ask your health care provider how often you should have your eyes checked.  Personal lifestyle choices, including:  Daily care of your teeth and gums.  Regular physical activity.  Eating a healthy diet.  Avoiding tobacco and drug use.  Limiting alcohol use.  Practicing safe sex.  Taking low-dose aspirin daily starting at age 30.  Taking vitamin and mineral supplements as  recommended by your health care provider. What happens during an annual well check? The services and screenings done by your health care provider during your annual well check will depend on your age, overall health, lifestyle risk factors, and family history of disease. Counseling  Your health care provider may ask you questions about your:  Alcohol use.  Tobacco use.  Drug use.  Emotional well-being.  Home and relationship well-being.  Sexual activity.  Eating habits.  Work and work Statistician.  Method of birth control.  Menstrual cycle.  Pregnancy history. Screening  You may have the following tests or measurements:  Height, weight, and BMI.  Blood pressure.  Lipid and cholesterol levels. These may be checked every 5 years, or more frequently if you are over 44 years old.  Skin check.  Lung cancer screening. You may have this screening every year starting at age 80 if you have a 30-pack-year history of smoking and currently smoke or have quit within the past 15 years.  Fecal occult blood test (FOBT) of the stool. You may have this test every year starting at age 74.  Flexible sigmoidoscopy or colonoscopy. You may have a sigmoidoscopy every 5 years or a colonoscopy every 10 years starting at age 73.  Hepatitis C blood test.  Hepatitis B blood test.  Sexually transmitted disease (STD) testing.  Diabetes screening. This is done by checking your blood sugar (glucose) after you have not eaten for a while (fasting). You may have this done every 1-3 years.  Mammogram. This may be done every 1-2 years. Talk to your health care provider about when you should start having regular mammograms. This may  depend on whether you have a family history of breast cancer.  BRCA-related cancer screening. This may be done if you have a family history of breast, ovarian, tubal, or peritoneal cancers.  Pelvic exam and Pap test. This may be done every 3 years starting at age 61.  Starting at age 71, this may be done every 5 years if you have a Pap test in combination with an HPV test.  Bone density scan. This is done to screen for osteoporosis. You may have this scan if you are at high risk for osteoporosis. Discuss your test results, treatment options, and if necessary, the need for more tests with your health care provider. Vaccines  Your health care provider may recommend certain vaccines, such as:  Influenza vaccine. This is recommended every year.  Tetanus, diphtheria, and acellular pertussis (Tdap, Td) vaccine. You may need a Td booster every 10 years.  Zoster vaccine. You may need this after age 60.  Pneumococcal 13-valent conjugate (PCV13) vaccine. You may need this if you have certain conditions and were not previously vaccinated.  Pneumococcal polysaccharide (PPSV23) vaccine. You may need one or two doses if you smoke cigarettes or if you have certain conditions. Talk to your health care provider about which screenings and vaccines you need and how often you need them. This information is not intended to replace advice given to you by your health care provider. Make sure you discuss any questions you have with your health care provider. Document Released: 01/30/2015 Document Revised: 09/23/2015 Document Reviewed: 11/04/2014 Elsevier Interactive Patient Education  2017 [place] Prevention in the Home Falls can cause injuries. They can happen to people of all ages. There are many things you can do to make your home safe and to help prevent falls. What can I do on the outside of my home?  Regularly fix the edges of walkways and driveways and fix any cracks.  Remove anything that might make you trip as you walk through a door, such as a raised step or threshold.  Trim any bushes or trees on the path to your home.  Use bright outdoor lighting.  Clear any walking paths of anything that might make someone trip, such as rocks or  tools.  Regularly check to see if handrails are loose or broken. Make sure that both sides of any steps have handrails.  Any raised decks and porches should have guardrails on the edges.  Have any leaves, snow, or ice cleared regularly.  Use sand or salt on walking paths during winter.  Clean up any spills in your garage right away. This includes oil or grease spills. What can I do in the bathroom?  Use night lights.  Install grab bars by the toilet and in the tub and shower. Do not use towel bars as grab bars.  Use non-skid mats or decals in the tub or shower.  If you need to sit down in the shower, use a plastic, non-slip stool.  Keep the floor dry. Clean up any water that spills on the floor as soon as it happens.  Remove soap buildup in the tub or shower regularly.  Attach bath mats securely with double-sided non-slip rug tape.  Do not have throw rugs and other things on the floor that can make you trip. What can I do in the bedroom?  Use night lights.  Make sure that you have a light by your bed that is easy to reach.  Do not use  any sheets or blankets that are too big for your bed. They should not hang down onto the floor.  Have a firm chair that has side arms. You can use this for support while you get dressed.  Do not have throw rugs and other things on the floor that can make you trip. What can I do in the kitchen?  Clean up any spills right away.  Avoid walking on wet floors.  Keep items that you use a lot in easy-to-reach places.  If you need to reach something above you, use a strong step stool that has a grab bar.  Keep electrical cords out of the way.  Do not use floor polish or wax that makes floors slippery. If you must use wax, use non-skid floor wax.  Do not have throw rugs and other things on the floor that can make you trip. What can I do with my stairs?  Do not leave any items on the stairs.  Make sure that there are handrails on both  sides of the stairs and use them. Fix handrails that are broken or loose. Make sure that handrails are as long as the stairways.  Check any carpeting to make sure that it is firmly attached to the stairs. Fix any carpet that is loose or worn.  Avoid having throw rugs at the top or bottom of the stairs. If you do have throw rugs, attach them to the floor with carpet tape.  Make sure that you have a light switch at the top of the stairs and the bottom of the stairs. If you do not have them, ask someone to add them for you. What else can I do to help prevent falls?  Wear shoes that:  Do not have high heels.  Have rubber bottoms.  Are comfortable and fit you well.  Are closed at the toe. Do not wear sandals.  If you use a stepladder:  Make sure that it is fully opened. Do not climb a closed stepladder.  Make sure that both sides of the stepladder are locked into place.  Ask someone to hold it for you, if possible.  Clearly mark and make sure that you can see:  Any grab bars or handrails.  First and last steps.  Where the edge of each step is.  Use tools that help you move around (mobility aids) if they are needed. These include:  Canes.  Walkers.  Scooters.  Crutches.  Turn on the lights when you go into a dark area. Replace any light bulbs as soon as they burn out.  Set up your furniture so you have a clear path. Avoid moving your furniture around.  If any of your floors are uneven, fix them.  If there are any pets around you, be aware of where they are.  Review your medicines with your doctor. Some medicines can make you feel dizzy. This can increase your chance of falling. Ask your doctor what other things that you can do to help prevent falls. This information is not intended to replace advice given to you by your health care provider. Make sure you discuss any questions you have with your health care provider. Document Released: 10/30/2008 Document Revised:  06/11/2015 Document Reviewed: 02/07/2014 Elsevier Interactive Patient Education  2017 [employer].

## 2017-07-03 NOTE — Telephone Encounter (Signed)
Pt has been scheduled for her follow up appointment.

## 2017-07-03 NOTE — Progress Notes (Signed)
Subjective:   Kelly Douglas is a 62 y.o. female who presents for Medicare Annual (Subsequent) preventive examination.  Review of Systems:    Cardiac Risk Factors include: hypertension     Objective:     Vitals: BP (!) 150/74 (BP Location: Right Arm, Patient Position: Sitting, Cuff Size: Normal)   Pulse 100   Temp 98.6 F (37 C) (Temporal)   Resp 20   Ht 5\' 8"  (1.727 m)   Wt 151 lb 1.9 oz (68.5 kg)   SpO2 93%   BMI 22.98 kg/m   Body mass index is 22.98 kg/m.  Advanced Directives 07/03/2017 11/02/2016 03/01/2016 04/23/2014 12/24/2013 12/15/2013 11/19/2013  Does Patient Have a Medical Advance Directive? Yes No No No No No No  Does patient want to make changes to medical advance directive? Yes (MAU/Ambulatory/Procedural Areas - Information given) - - - - - -  Would patient like information on creating a medical advance directive? - No - Patient declined Yes (MAU/Ambulatory/Procedural Areas - Information given) Yes - Educational materials given No - patient declined information - No - patient declined information  Pre-existing out of facility DNR order (yellow form or pink MOST form) - - - - - - -    Tobacco Social History   Tobacco Use  Smoking Status Current Every Day Smoker  . Packs/day: 0.50  . Years: 45.00  . Pack years: 22.50  . Types: Cigarettes  . Start date: 07/14/1971  Smokeless Tobacco Never Used  Tobacco Comment   0.5 PPD current plans to reduce      Ready to quit: No Counseling given: Yes Comment: 0.5 PPD current plans to reduce    Clinical Intake:  Pre-visit preparation completed: Yes  Pain : 0-10 Pain Score: 5  Pain Type: Chronic pain Pain Location: Back Pain Orientation: Lower, Mid Pain Descriptors / Indicators: Constant, Sharp Pain Onset: More than a month ago Pain Frequency: Constant Pain Relieving Factors: Tylenol & Tramadol Effect of Pain on Daily Activities: yes  Pain Relieving Factors: Tylenol & Tramadol  BMI - recorded:  23 Nutritional Status: BMI of 19-24  Normal Diabetes: No  How often do you need to have someone help you when you read instructions, pamphlets, or other written materials from your doctor or pharmacy?: 1 - Never What is the last grade level you completed in school?: 12  Interpreter Needed?: No  Information entered by :: Abby  Past Medical History:  Diagnosis Date  . Anxiety   . Arteriosclerotic cardiovascular disease (ASCVD) 2013   coronary calcification-left main, LAD and CX; small pericardial effusion  . Arthritis   . Chronic back pain   . Chronic bronchitis   . Chronic lung disease    Chronic scarring and volume loss-left lung; characteristics of a chronic infectious process-possible MAI  . Depression   . GERD (gastroesophageal reflux disease)    Dr Gala Romney EGD 09/2009->esophagitis, sm HH, antral erosions, atonic esophagus  . Hyperlipidemia   . Hypothyroid 1981 approx  . Scleroderma ([place])   . Seasonal allergies   . Syncope    Multiple spells over the past 40+ years, likely neurocardiogenic  . Tobacco abuse 06/24/2009   Past Surgical History:  Procedure Laterality Date  . BILATERAL SALPINGOOPHORECTOMY  2013    Dr. Elonda Husky; uterus remains in situ  . COLONOSCOPY  09/28/09   anal papilla otherwise normal  . COLONOSCOPY N/A 08/29/2012   [id number] polyp-removed as described above. tubular adenoma  . ESOPHAGOGASTRODUODENOSCOPY  10/07/09   Dr. Barrie Dunker of esophageal  mucosa, diffusely ?esophagitis (bx benign), small HH/antral erosions, erythema bx benign. 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 ESOPHAGOGASTRODUODENOSCOPY (EGD) WITH ESOPHAGEAL DILATION N/A 03/07/2012   [id number], patent, tubular esophagus of uncertain significance-status post biopsy )unremarkable). Hiatal hernia  . LAPAROSCOPIC APPENDECTOMY  07/16/2011   Procedure: APPENDECTOMY LAPAROSCOPIC;  Surgeon: Donato Heinz, MD;  Location: [facility];  Service: General;  Laterality: N/A;  . TUBAL LIGATION      Family History  Problem Relation Age of Onset  . Stroke Father   . Aneurysm Father   . Coronary artery disease Brother   . Hypertension Brother   . Down syndrome Brother   . Hypertension Brother   . Hypertension Brother        father/mother  . Aneurysm Mother   . Throat cancer Brother 49       throat cancer  . Hypertension Sister   . Diabetes Brother   . Diabetes Brother   . Anesthesia problems Neg Hx   . Hypotension Neg Hx   . Malignant hyperthermia Neg Hx   . Pseudochol deficiency Neg Hx   . Colon cancer Neg Hx    Social History   Socioeconomic History  . Marital status: Married    Spouse name: Not on file  . Number of children: 2  . Years of education: Not on file  . Highest education level: Not on file  Occupational History  . Occupation: disabled    Fish farm manager: NOT EMPLOYED  Social Needs  . Financial resource strain: Very hard  . Food insecurity:    Worry: Sometimes true    Inability: Sometimes true  . Transportation needs:    Medical: No    Non-medical: No  Tobacco Use  . Smoking status: Current Every Day Smoker    Packs/day: 0.50    Years: 45.00    Pack years: 22.50    Types: Cigarettes    Start date: 07/14/1971  . Smokeless tobacco: Never Used  . Tobacco comment: 0.5 PPD current plans to reduce   Substance and Sexual Activity  . Alcohol use: No    Alcohol/week: 0.0 oz    Comment: quit June 2012-used to drink 6 pack daily  . Drug use: No  . Sexual activity: Yes    Birth control/protection: Post-menopausal  Lifestyle  . Physical activity:    Days per week: 2 days    Minutes per session: 20 min  . Stress: Not at all  Relationships  . Social connections:    Talks on phone: Never    Gets together: More than three times a week    Attends religious service: More than 4 times per year    Active member of club or organization: No    Attends meetings of clubs or organizations: Never    Relationship status: Married  Other Topics Concern  . Not on  file  Social History Narrative  . Not on file    Outpatient Encounter Medications as of 07/03/2017  Medication Sig  . Acetaminophen (TYLENOL ARTHRITIS PAIN PO) Take 2 tablets by mouth 3 (three) times daily.  Marland Kitchen albuterol (PROVENTIL HFA;VENTOLIN HFA) 108 (90 BASE) MCG/ACT inhaler Inhale 2 puffs into the lungs every 6 (six) hours as needed. Shortness of breath  . amLODipine (NORVASC) 5 MG tablet TAKE 1 TABLET EVERY DAY  . busPIRone (BUSPAR) 10 MG tablet Take 1 tablet (10 mg total) by mouth 3 (three) times daily.  . DULoxetine (CYMBALTA) 60 MG capsule Take 1 capsule (60 mg total) by mouth daily.  Marland Kitchen  gabapentin (NEURONTIN) 600 MG tablet TAKE 1 TABLET TWICE DAILY  (DOSE  REDUCTION)  . hyoscyamine (LEVSIN SL) 0.125 MG SL tablet Place 1 tablet (0.125 mg total) under the tongue 4 (four) times daily -  before meals and at bedtime. As needed for abdominal cramps and loose stool.  Marland Kitchen levothyroxine (SYNTHROID, LEVOTHROID) 75 MCG tablet TAKE 1 TABLET EVERY DAY (Patient taking differently: ALTERNATES WITH 1 TAB AND 1/2 TABLET EVERY DAY)  . meloxicam (MOBIC) 7.5 MG tablet Take 1 tablet (7.5 mg total) daily by mouth.  . montelukast (SINGULAIR) 10 MG tablet TAKE 1 TABLET AT BEDTIME  . Multiple Vitamins-Calcium (ONE-A-DAY WOMENS PO) Take 1 tablet by mouth daily.  . pantoprazole (PROTONIX) 40 MG tablet TAKE 1 TABLET TWICE DAILY  . potassium chloride (K-DUR) 10 MEQ tablet Take 1 tablet (10 mEq total) by mouth daily.  . pravastatin (PRAVACHOL) 40 MG tablet TAKE 1 TABLET EVERY DAY  . traMADol (ULTRAM) 50 MG tablet TAKE 1 TABLET BY MOUTH EVERY 6 HOURS AS NEEDED  . [DISCONTINUED] cephALEXin (KEFLEX) 500 MG capsule Take 1 capsule (500 mg total) by mouth 3 (three) times daily. (Patient not taking: Reported on 07/03/2017)  . [DISCONTINUED] cholestyramine (QUESTRAN) 4 g packet 1 PACKET DAILY. DO NOT TAKE WITHIN 2 HOURS OF OTHER MEDICATIONS (Patient not taking: Reported on 05/02/2017)  . [DISCONTINUED] dicyclomine (BENTYL) 10  MG capsule TAKE 1 CAPSULE THREE TIMES DAILY WITH MEALS AS NEEDED FOR  SPASMS (Patient not taking: Reported on 07/03/2017)  . [DISCONTINUED] temazepam (RESTORIL) 30 MG capsule Take 1 capsule (30 mg total) by mouth at bedtime. (Patient not taking: Reported on 07/03/2017)   No facility-administered encounter medications on file as of 07/03/2017.     Activities of Daily Living In your present state of health, do you have any difficulty performing the following activities: 07/03/2017  Hearing? N  Vision? N  Difficulty concentrating or making decisions? N  Walking or climbing stairs? N  Dressing or bathing? N  Doing errands, shopping? N  Preparing Food and eating ? N  Using the Toilet? N  In the past six months, have you accidently leaked urine? N  Do you have problems with loss of bowel control? Y  Managing your Medications? N  Managing your Finances? N  Housekeeping or managing your Housekeeping? N  Some recent data might be hidden    Patient Care Team: [name], MD as PCP - General Rothbart, [name], MD (Cardiology) Gala Romney [name], MD (Gastroenterology) Teena Irani, PTA as Physical Therapy Assistant (Physical Therapy) Leta Baptist, MD as Consulting Physician (Otolaryngology)    Assessment:   This is a routine wellness examination for Marvel.  Exercise Activities and Dietary recommendations Current Exercise Habits: The patient does not participate in regular exercise at present, Exercise limited by: orthopedic condition(s)  Goals    None      Fall Risk Fall Risk  07/03/2017 03/28/2017 03/28/2017 03/01/2016 01/28/2016  Falls in the past year? No No No No No  Number falls in past yr: - - - - -  Injury with Fall? - - - - -  Risk Factor Category  - - - - -  Risk for fall due to : - - - - -   Is the patient's home free of loose throw rugs in walkways, pet beds, electrical cords, etc?   yes      Grab bars in the bathroom? no      Handrails on the stairs?   no  Adequate lighting?   yes  Timed Get Up and Go performed: No  Depression Screen PHQ 2/9 Scores 07/03/2017 03/29/2017 03/28/2017 03/01/2016  PHQ - 2 Score 0 3 1 0  PHQ- 9 Score - 12 1 -     Cognitive Function     6CIT Screen 07/03/2017 03/01/2016  What Year? 0 points 0 points  What month? 0 points 0 points  What time? 0 points 0 points  Count back from 20 0 points 0 points  Months in reverse 0 points 0 points  Repeat phrase 0 points 0 points  Total Score 0 0    Immunization History  Administered Date(s) Administered  . Influenza Split 10/12/2011  . Influenza Whole 10/13/2009  . Influenza,inj,Quad PF,6+ Mos 11/01/2012, 09/16/2013, 11/20/2014, 10/27/2015, 11/28/2016  . Pneumococcal Conjugate-13 12/20/2013  . Td 06/24/2009  . Tdap 11/17/2010    Qualifies for Shingles Vaccine?Patient will call insurance company to see if this is a covered vaccine.   Screening Tests Health Maintenance  Topic Date Due  . INFLUENZA VACCINE  08/17/2017  . MAMMOGRAM  08/12/2018  . PAP SMEAR  11/29/2019  . TETANUS/TDAP  11/16/2020  . COLONOSCOPY  08/30/2022  . Hepatitis C Screening  Completed  . HIV Screening  Completed    Cancer Screenings: Lung: Low Dose CT Chest recommended if Age 67-80 years, 30 pack-year currently smoking OR have quit w/in 15years. Patient does not qualify. Breast:  Up to date on Mammogram? Yes   Up to date of Bone Density/Dexa? No does not qualify for this.  Colorectal: UTD  Additional Screenings: Hepatitis C Screening: completed on 03/09/2009     Plan:      I have personally reviewed and noted the following in the patient's chart:   . Medical and social history . Use of alcohol, tobacco or illicit drugs  . Current medications and supplements . Functional ability and status . Nutritional status . Physical activity . Advanced directives . List of other physicians . Hospitalizations, surgeries, and ER visits in previous 12 months . Vitals . Screenings to  include cognitive, depression, and falls . Referrals and appointments  In addition, I have reviewed and discussed with patient certain preventive protocols, quality metrics, and best practice recommendations. A written personalized care plan for preventive services as well as general preventive health recommendations were provided to patient.     Tod Persia, [place]  07/03/2017

## 2017-07-11 ENCOUNTER — Telehealth: Payer: Self-pay

## 2017-07-11 NOTE — Telephone Encounter (Signed)
Several unsuccessful attempts have been made to reach patient.  Writer consulted with Dr. Modesta Messing and the patient will be listed as inactive.    If services are needed again, please place a consult request.

## 2017-07-18 ENCOUNTER — Ambulatory Visit (INDEPENDENT_AMBULATORY_CARE_PROVIDER_SITE_OTHER): Payer: Medicare HMO | Admitting: Internal Medicine

## 2017-07-18 ENCOUNTER — Encounter: Payer: Self-pay | Admitting: Internal Medicine

## 2017-07-18 VITALS — BP 134/82 | HR 112 | Temp 97.3°F | Ht 68.0 in | Wt 151.6 lb

## 2017-07-18 DIAGNOSIS — K219 Gastro-esophageal reflux disease without esophagitis: Secondary | ICD-10-CM

## 2017-07-18 DIAGNOSIS — K580 Irritable bowel syndrome with diarrhea: Secondary | ICD-10-CM | POA: Diagnosis not present

## 2017-07-18 NOTE — Patient Instructions (Addendum)
Imodium (Loperamide) - one tablet 3x a day - over the counter  Continue Protonix 40 mg daily  Office visit with [place] in  2 months  Call me in next couple if this regimen is not helping with your bowels

## 2017-07-18 NOTE — Progress Notes (Signed)
Primary Care Physician:  [name], MD Primary Gastroenterologist:  Dr. Gala Romney  Pre-Procedure History & Physical: HPI:  Kelly Douglas is a 62 y.o. female here for f/u of GERD and IBS-D with episodes of incontinence.  Maybe 2 episodes weekly -when diarrhea occurs with incontinence - quit distressing. The majority of the time, she report normal bowel function. Questran worked well- but couldn't afford it.  Could not afford on demand levsin either.  She denies any blood loss; due for screening colonoscopy in 2021.  Past Medical History:  Diagnosis Date  . Anxiety   . Arteriosclerotic cardiovascular disease (ASCVD) 2013   coronary calcification-left main, LAD and CX; small pericardial effusion  . Arthritis   . Chronic back pain   . Chronic bronchitis   . Chronic lung disease    Chronic scarring and volume loss-left lung; characteristics of a chronic infectious process-possible MAI  . Depression   . GERD (gastroesophageal reflux disease)    Dr Gala Romney EGD 09/2009->esophagitis, sm HH, antral erosions, atonic esophagus  . Hyperlipidemia   . Hypothyroid 1981 approx  . Scleroderma ([place])   . Seasonal allergies   . Syncope    Multiple spells over the past 40+ years, likely neurocardiogenic  . Tobacco abuse 06/24/2009    Past Surgical History:  Procedure Laterality Date  . BILATERAL SALPINGOOPHORECTOMY  2013    Dr. Elonda Husky; uterus remains in situ  . COLONOSCOPY  09/28/09   anal papilla otherwise normal  . COLONOSCOPY N/A 08/29/2012   IWP:[id number] polyp-removed as described above. tubular adenoma  . ESOPHAGOGASTRODUODENOSCOPY  10/07/09   Dr. Barrie Dunker of esophageal mucosa, diffusely ?esophagitis (bx benign), small HH/antral erosions, erythema bx benign. 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 ESOPHAGOGASTRODUODENOSCOPY (EGD) WITH ESOPHAGEAL DILATION N/A 03/07/2012   ASN:[id number], patent, tubular esophagus of uncertain significance-status post biopsy )unremarkable).  Hiatal hernia  . LAPAROSCOPIC APPENDECTOMY  07/16/2011   Procedure: APPENDECTOMY LAPAROSCOPIC;  Surgeon: Donato Heinz, MD;  Location: [facility];  Service: General;  Laterality: N/A;  . TUBAL LIGATION      Prior to Admission medications   Medication Sig Start Date End Date Taking? Authorizing Provider  Acetaminophen (TYLENOL ARTHRITIS PAIN PO) Take 2 tablets by mouth 3 (three) times daily.   Yes [provider]  albuterol (PROVENTIL HFA;VENTOLIN HFA) 108 (90 BASE) MCG/ACT inhaler Inhale 2 puffs into the lungs every 6 (six) hours as needed. Shortness of breath 08/02/12  Yes [name], MD  amLODipine (NORVASC) 5 MG tablet TAKE 1 TABLET EVERY DAY 02/15/17  Yes [name], MD  busPIRone (BUSPAR) 10 MG tablet Take 1 tablet (10 mg total) by mouth 3 (three) times daily. 04/25/17  Yes [name], MD  DULoxetine (CYMBALTA) 60 MG capsule Take 1 capsule (60 mg total) by mouth daily. 03/28/17  Yes [name], MD  gabapentin (NEURONTIN) 600 MG tablet TAKE 1 TABLET TWICE DAILY  (DOSE  REDUCTION) 09/02/16  Yes [name], MD  levothyroxine (SYNTHROID, LEVOTHROID) 75 MCG tablet TAKE 1 TABLET EVERY DAY Patient taking differently: ALTERNATES WITH 1 TAB AND 1/2 TABLET EVERY DAY 02/23/17  Yes [name], MD  meloxicam (MOBIC) 7.5 MG tablet Take 1 tablet (7.5 mg total) daily by mouth. 12/02/16  Yes Carole Civil, MD  montelukast (SINGULAIR) 10 MG tablet TAKE 1 TABLET AT BEDTIME 02/15/17  Yes [name], MD  Multiple Vitamins-Calcium (ONE-A-DAY WOMENS PO) Take 1 tablet by mouth daily.   Yes [provider]  pantoprazole (PROTONIX) 40 MG tablet TAKE 1 TABLET TWICE DAILY 06/08/17  Yes Walden Field A, NP  potassium chloride (K-DUR) 10 MEQ tablet Take 1 tablet (10 mEq total) by mouth daily. 04/04/17  Yes [name], MD  pravastatin (PRAVACHOL) 40 MG tablet TAKE 1 TABLET EVERY DAY 02/15/17  Yes [name], MD  hyoscyamine (LEVSIN  SL) 0.125 MG SL tablet Place 1 tablet (0.125 mg total) under the tongue 4 (four) times daily -  before meals and at bedtime. As needed for abdominal cramps and loose stool. Patient not taking: Reported on 07/18/2017 05/02/17   Daneil Dolin, MD  traMADol (ULTRAM) 50 MG tablet TAKE 1 TABLET BY MOUTH EVERY 6 HOURS AS NEEDED Patient not taking: Reported on 07/18/2017 06/09/17   Carole Civil, MD    Allergies as of 07/18/2017 - Review Complete 07/18/2017  Allergen Reaction Noted  . Aspirin Other (See Comments) 05/19/2015    Family History  Problem Relation Age of Onset  . Stroke Father   . Aneurysm Father   . Coronary artery disease Brother   . Hypertension Brother   . Down syndrome Brother   . Hypertension Brother   . Hypertension Brother        father/mother  . Aneurysm Mother   . Throat cancer Brother 49       throat cancer  . Hypertension Sister   . Diabetes Brother   . Diabetes Brother   . Anesthesia problems Neg Hx   . Hypotension Neg Hx   . Malignant hyperthermia Neg Hx   . Pseudochol deficiency Neg Hx   . Colon cancer Neg Hx     Social History   Socioeconomic History  . Marital status: Married    Spouse name: Not on file  . Number of children: 2  . Years of education: Not on file  . Highest education level: Not on file  Occupational History  . Occupation: disabled    Fish farm manager: NOT EMPLOYED  Social Needs  . Financial resource strain: Very hard  . Food insecurity:    Worry: Sometimes true    Inability: Sometimes true  . Transportation needs:    Medical: No    Non-medical: No  Tobacco Use  . Smoking status: Current Every Day Smoker    Packs/day: 0.50    Years: 45.00    Pack years: 22.50    Types: Cigarettes    Start date: 07/14/1971  . Smokeless tobacco: Never Used  . Tobacco comment: 0.5 PPD current plans to reduce   Substance and Sexual Activity  . Alcohol use: No    Alcohol/week: 0.0 oz    Comment: quit June 2012-used to drink 6 pack daily  .  Drug use: No  . Sexual activity: Yes    Birth control/protection: Post-menopausal  Lifestyle  . Physical activity:    Days per week: 2 days    Minutes per session: 20 min  . Stress: Not at all  Relationships  . Social connections:    Talks on phone: Never    Gets together: More than three times a week    Attends religious service: More than 4 times per year    Active member of club or organization: No    Attends meetings of clubs or organizations: Never    Relationship status: Married  . Intimate partner violence:    Fear of current or ex partner: No    Emotionally abused: No    Physically abused: No  Forced sexual activity: No  Other Topics Concern  . Not on file  Social History Narrative  . Not on file    Review of Systems: See HPI, otherwise negative ROS  Physical Exam: BP 134/82   Pulse (!) 112   Temp (!) 97.3 F (36.3 C) (Oral)   Ht 5\' 8"  (1.727 m)   Wt 151 lb 9.6 oz (68.8 kg)   BMI 23.05 kg/m  General:   Alert,  Well-developed, well-nourished, pleasant and cooperative in NAD Neck:  Supple; no masses or thyromegaly. No significant cervical adenopathy. Lungs:  Clear throughout to auscultation.   No wheezes, crackles, or rhonchi. No acute distress. Heart:  Regular rate and rhythm; no murmurs, clicks, rubs,  or gallops. Abdomen: Non-distended, normal bowel sounds.  Soft and nontender without appreciable mass or hepatosplenomegaly.  Pulses:  Normal pulses noted. Extremities:  Without clubbing or edema.  Impression/Plan:  GERD well controlled on Protonix;  IBS-D - only intermittant flare but often accompanied by episodes of incontinence.    Recommendations for now:  Imodium (Loperamide) - one tablet 3x a day - over the counter  Continue Protonix 40 mg daily  Office visit with [place] in  2 months  Colonoscopy 2 in years   Pt to call if this regimen is not helping with your bowels    Notice: This dictation was prepared with Dragon dictation along with  smaller phrase technology. Any transcriptional errors that result from this process are unintentional and may not be corrected upon review.

## 2017-08-02 ENCOUNTER — Telehealth: Payer: Self-pay

## 2017-08-02 NOTE — Telephone Encounter (Signed)
Called pt and informed of Dr. Roseanne Kaufman recommendation.

## 2017-08-02 NOTE — Telephone Encounter (Signed)
Sounds like we are making some progress. She might need to just alternate 2 Immodiums  1 day and 3 the next and see how it goes.  Office visit as scheduled in the next several weeks.

## 2017-08-02 NOTE — Telephone Encounter (Signed)
Pt called office to let Dr. Gala Romney know she has been taking Imodium 3 per day. She is now having some constipation. She has to push and only little bit of stool comes out. No further accidents. [phone]

## 2017-08-08 ENCOUNTER — Ambulatory Visit: Payer: Medicare HMO | Admitting: Emergency Medicine

## 2017-08-14 ENCOUNTER — Ambulatory Visit (HOSPITAL_COMMUNITY)
Admission: RE | Admit: 2017-08-14 | Discharge: 2017-08-14 | Disposition: A | Discharge status: Home / Self Care | Payer: Medicare HMO | Source: Ambulatory Visit | Attending: Family Medicine | Admitting: Family Medicine

## 2017-08-14 DIAGNOSIS — Z1231 Encounter for screening mammogram for malignant neoplasm of breast: Secondary | ICD-10-CM | POA: Insufficient documentation

## 2017-08-14 DIAGNOSIS — Z1239 Encounter for other screening for malignant neoplasm of breast: Secondary | ICD-10-CM

## 2017-08-29 ENCOUNTER — Encounter: Payer: Self-pay | Admitting: Emergency Medicine

## 2017-08-29 ENCOUNTER — Ambulatory Visit (INDEPENDENT_AMBULATORY_CARE_PROVIDER_SITE_OTHER): Payer: Medicare HMO | Admitting: Emergency Medicine

## 2017-08-29 VITALS — BP 112/74 | HR 91 | Ht 68.0 in | Wt 153.0 lb

## 2017-08-29 DIAGNOSIS — R911 Solitary pulmonary nodule: Secondary | ICD-10-CM | POA: Diagnosis not present

## 2017-08-29 DIAGNOSIS — J449 Chronic obstructive pulmonary disease, unspecified: Secondary | ICD-10-CM | POA: Diagnosis not present

## 2017-08-29 DIAGNOSIS — R918 Other nonspecific abnormal finding of lung field: Secondary | ICD-10-CM

## 2017-08-29 DIAGNOSIS — I272 Pulmonary hypertension, unspecified: Secondary | ICD-10-CM | POA: Diagnosis not present

## 2017-08-29 DIAGNOSIS — R6889 Other general symptoms and signs: Secondary | ICD-10-CM | POA: Diagnosis not present

## 2017-08-29 NOTE — Assessment & Plan Note (Signed)
She has COPD with asthmatic component.  Not on scheduled bronchodilators.  FEV1 79% predicted in 2017.  She has albuterol and uses rarely.  Most important issue here is smoking cessation and we discussed this today

## 2017-08-29 NOTE — Assessment & Plan Note (Signed)
Several stable pulmonary nodules but one left lower lobe nodule that has consistently grown on serial films, now 1.1 x 1.0 cm.  It was not significantly hypermetabolic on PET scan 68/3419.  At the judgment call here as to whether she should undergo bronchoscopy with biopsy versus referral for primary resection since this could reflect stage I disease.  I will repeat her pulmonary function testing, PET scan, echocardiogram to prep for possible referral to surgery.  I will also discuss her case with thoracic oncology conference to get a consensus opinion regarding direct referral versus bronchoscopy with BAL, biopsies.  We will perform an echocardiogram to evaluate heart function. We will repeat your pulmonary function testing to compare with 2017. We will repeat your PET scan to further evaluate your pulmonary nodules Dr. Lamonte Sakai will discuss her case with the thoracic oncology conference and we will decide whether it would be best for [place] to pursue a biopsy of your lung nodule versus possibly referral for surgical removal. Follow with Dr Lamonte Sakai next available after your testing has been completed.

## 2017-08-29 NOTE — Progress Notes (Signed)
Subjective:    Patient ID: Kelly Douglas, female    DOB: [birth year], 62 y.o.   MRN: [id number]  HPI 62 year old smoker (45+ pack years) with a history of scleroderma, coronary artery disease, chronic abnormalities on CT scan of the chest that have been characterized by scarring and possible mycobacterial disease.  She has undergone low-dose CT scan lung cancer screening, most recent on 11/01/16.  This study showed overall stability of most of her pulmonary nodular disease but with some interval increase in size of an 11.3 mm irregular central left lower lobe nodule.  This finding prompted a PET scan that was done on 11/17/16 that I have reviewed.  This study shows minimal but discernible FDG uptake in the nodule in question (SUV 1.5). She has been on prednisone before, not currently, last was probably 6 months ago. She has cut down to 1/2 a pack a day. Denies significant cough, chest pain, sputum. No hemoptysis.    ROV 08/29/17 --this is a follow-up visit for patient with a history of tobacco use, scleroderma (on no meds), coronary artery disease.  She has moderate obstruction with a positive bronchodilator response by pulmonary function testing 04/24/2015 that I reviewed.  We have been following chronic scarring and nodular disease by CT scan of the chest, particularly and an 11 mm irregular central left lower lobe nodule that showed minimal hypermetabolism by PET scan 11/17/2016.  Most recently she underwent a CT scan of the chest on 05/18/2017 that I reviewed.  This again showed a slight interval increase in her left lower lobe nodule, now 1.1 x 1.0 cm.  Other scattered nodules in the left lower lobe and the left upper lobe, right lower lobe were stable in size. Very rare albuterol use. Not on scheduled BD's. No flares. She remains on singulair   FEV1 79% pred 2017   Review of Systems  HENT: Positive for dental problem, nosebleeds and sinus pressure.   Respiratory: Positive for shortness of breath.     Allergic/Immunologic: Negative.   Psychiatric/Behavioral: The patient is nervous/anxious.    Past Medical History:  Diagnosis Date  . Anxiety   . Arteriosclerotic cardiovascular disease (ASCVD) 2013   coronary calcification-left main, LAD and CX; small pericardial effusion  . Arthritis   . Chronic back pain   . Chronic bronchitis   . Chronic lung disease    Chronic scarring and volume loss-left lung; characteristics of a chronic infectious process-possible MAI  . Depression   . GERD (gastroesophageal reflux disease)    Dr Gala Romney EGD 09/2009->esophagitis, sm HH, antral erosions, atonic esophagus  . Hyperlipidemia   . Hypothyroid 1981 approx  . Scleroderma ([place])   . Seasonal allergies   . Syncope    Multiple spells over the past 40+ years, likely neurocardiogenic  . Tobacco abuse 06/24/2009     Family History  Problem Relation Age of Onset  . Stroke Father   . Aneurysm Father   . Coronary artery disease Brother   . Hypertension Brother   . Down syndrome Brother   . Hypertension Brother   . Hypertension Brother        father/mother  . Aneurysm Mother   . Throat cancer Brother 49       throat cancer  . Hypertension Sister   . Diabetes Brother   . Diabetes Brother   . Anesthesia problems Neg Hx   . Hypotension Neg Hx   . Malignant hyperthermia Neg Hx   . Pseudochol deficiency Neg Hx   .  Colon cancer Neg Hx      Social History   Socioeconomic History  . Marital status: Married    Spouse name: Not on file  . Number of children: 2  . Years of education: Not on file  . Highest education level: Not on file  Occupational History  . Occupation: disabled    Fish farm manager: NOT EMPLOYED  Social Needs  . Financial resource strain: Very hard  . Food insecurity:    Worry: Sometimes true    Inability: Sometimes true  . Transportation needs:    Medical: No    Non-medical: No  Tobacco Use  . Smoking status: Current Every Day Smoker    Packs/day: 0.50    Years: 45.00    Pack  years: 22.50    Types: Cigarettes    Start date: 07/14/1971  . Smokeless tobacco: Never Used  . Tobacco comment: 0.5 PPD current plans to reduce   Substance and Sexual Activity  . Alcohol use: No    Alcohol/week: 0.0 standard drinks    Comment: quit June 2012-used to drink 6 pack daily  . Drug use: No  . Sexual activity: Yes    Birth control/protection: Post-menopausal  Lifestyle  . Physical activity:    Days per week: 2 days    Minutes per session: 20 min  . Stress: Not at all  Relationships  . Social connections:    Talks on phone: Never    Gets together: More than three times a week    Attends religious service: More than 4 times per year    Active member of club or organization: No    Attends meetings of clubs or organizations: Never    Relationship status: Married  . Intimate partner violence:    Fear of current or ex partner: No    Emotionally abused: No    Physically abused: No    Forced sexual activity: No  Other Topics Concern  . Not on file  Social History Narrative  . Not on file     Allergies  Allergen Reactions  . Aspirin Other (See Comments)    Nose bleeds     Outpatient Medications Prior to Visit  Medication Sig Dispense Refill  . Acetaminophen (TYLENOL ARTHRITIS PAIN PO) Take 2 tablets by mouth 3 (three) times daily.    Marland Kitchen albuterol (PROVENTIL HFA;VENTOLIN HFA) 108 (90 BASE) MCG/ACT inhaler Inhale 2 puffs into the lungs every 6 (six) hours as needed. Shortness of breath 6.7 g 3  . amLODipine (NORVASC) 5 MG tablet TAKE 1 TABLET EVERY DAY 90 tablet 1  . busPIRone (BUSPAR) 10 MG tablet Take 1 tablet (10 mg total) by mouth 3 (three) times daily. 270 tablet 1  . DULoxetine (CYMBALTA) 60 MG capsule Take 1 capsule (60 mg total) by mouth daily. 90 capsule 3  . gabapentin (NEURONTIN) 600 MG tablet TAKE 1 TABLET TWICE DAILY  (DOSE  REDUCTION) 180 tablet 3  . levothyroxine (SYNTHROID, LEVOTHROID) 75 MCG tablet TAKE 1 TABLET EVERY DAY (Patient taking differently:  ALTERNATES WITH 1 TAB AND 1/2 TABLET EVERY DAY) 90 tablet 1  . meloxicam (MOBIC) 7.5 MG tablet Take 1 tablet (7.5 mg total) daily by mouth. 180 tablet 5  . montelukast (SINGULAIR) 10 MG tablet TAKE 1 TABLET AT BEDTIME 90 tablet 1  . Multiple Vitamins-Calcium (ONE-A-DAY WOMENS PO) Take 1 tablet by mouth daily.    . pantoprazole (PROTONIX) 40 MG tablet TAKE 1 TABLET TWICE DAILY 180 tablet 5  . potassium chloride (K-DUR) 10  MEQ tablet Take 1 tablet (10 mEq total) by mouth daily. 180 tablet 1  . pravastatin (PRAVACHOL) 40 MG tablet TAKE 1 TABLET EVERY DAY 90 tablet 1  . hyoscyamine (LEVSIN SL) 0.125 MG SL tablet Place 1 tablet (0.125 mg total) under the tongue 4 (four) times daily -  before meals and at bedtime. As needed for abdominal cramps and loose stool. (Patient not taking: Reported on 07/18/2017) 60 tablet 0  . traMADol (ULTRAM) 50 MG tablet TAKE 1 TABLET BY MOUTH EVERY 6 HOURS AS NEEDED (Patient not taking: Reported on 07/18/2017) 60 tablet 0   No facility-administered medications prior to visit.         Objective:   Physical Exam Vitals:   08/29/17 1421  BP: 112/74  Pulse: 91  SpO2: 98%  Weight: 153 lb (69.4 kg)  Height: _0  (1.727 m)   Gen: Pleasant, well-nourished, in no distress,  normal affect  ENT: No lesions,  mouth clear,  oropharynx clear, no postnasal drip  Neck: No JVD, no stridor  Lungs: No use of accessory muscles, clear without rales or rhonchi  Cardiovascular: RRR, heart sounds normal, no murmur or gallops, no peripheral edema  Musculoskeletal: No deformities, no cyanosis or clubbing  Neuro: alert, non focal  Skin: Some sclerodactyly and hyperpigmentation changes on her bilateral hands      Assessment & Plan:  COPD (chronic obstructive pulmonary disease) ([place]) She has COPD with asthmatic component.  Not on scheduled bronchodilators.  FEV1 79% predicted in 2017.  She has albuterol and uses rarely.  Most important issue here is smoking cessation and we  discussed this today  Pulmonary nodules Several stable pulmonary nodules but one left lower lobe nodule that has consistently grown on serial films, now 1.1 x 1.0 cm.  It was not significantly hypermetabolic on PET scan 03/4915.  At the judgment call here as to whether she should undergo bronchoscopy with biopsy versus referral for primary resection since this could reflect stage I disease.  I will repeat her pulmonary function testing, PET scan, echocardiogram to prep for possible referral to surgery.  I will also discuss her case with thoracic oncology conference to get a consensus opinion regarding direct referral versus bronchoscopy with BAL, biopsies.  We will perform an echocardiogram to evaluate heart function. We will repeat your pulmonary function testing to compare with 2017. We will repeat your PET scan to further evaluate your pulmonary nodules Dr. Lamonte Sakai will discuss her case with the thoracic oncology conference and we will decide whether it would be best for [place] to pursue a biopsy of your lung nodule versus possibly referral for surgical removal. Follow with Dr Lamonte Sakai next available after your testing has been completed.   Baltazar Apo, MD, PhD 08/29/2017, 2:59 PM [facility] [phone] or if no answer [phone]

## 2017-08-29 NOTE — Patient Instructions (Signed)
We will perform an echocardiogram to evaluate heart function. We will repeat your pulmonary function testing to compare with 2017. We will repeat your PET scan to further evaluate your pulmonary nodules Dr. Lamonte Sakai will discuss her case with the thoracic oncology conference and we will decide whether it would be best for [place] to pursue a biopsy of your lung nodule versus possibly referral for surgical removal. Follow with Dr Lamonte Sakai next available after your testing has been completed.

## 2017-09-01 ENCOUNTER — Telehealth: Payer: Self-pay | Admitting: Emergency Medicine

## 2017-09-01 NOTE — Telephone Encounter (Addendum)
Pt needs to be set up for ENB per RB. Attempted to contact pt. I did not receive an answer. There was no option for me to leave a message due to her voicemail not being set up. Will try back.   Orders for ENB and Super D CT will be placed once I speak to the pt.

## 2017-09-04 NOTE — Telephone Encounter (Signed)
Attempted to contact pt. I did not receive an answer. There was no option for me to leave a message due to her voicemail not being set up. Will try back.  

## 2017-09-05 ENCOUNTER — Ambulatory Visit (HOSPITAL_COMMUNITY)
Admission: RE | Admit: 2017-09-05 | Discharge: 2017-09-05 | Disposition: A | Discharge status: Home / Self Care | Payer: Medicare HMO | Source: Ambulatory Visit | Attending: Emergency Medicine | Admitting: Emergency Medicine

## 2017-09-05 DIAGNOSIS — I10 Essential (primary) hypertension: Secondary | ICD-10-CM | POA: Insufficient documentation

## 2017-09-05 DIAGNOSIS — R911 Solitary pulmonary nodule: Secondary | ICD-10-CM | POA: Diagnosis not present

## 2017-09-05 DIAGNOSIS — E785 Hyperlipidemia, unspecified: Secondary | ICD-10-CM | POA: Diagnosis not present

## 2017-09-05 DIAGNOSIS — J449 Chronic obstructive pulmonary disease, unspecified: Secondary | ICD-10-CM | POA: Insufficient documentation

## 2017-09-05 DIAGNOSIS — Z720 Tobacco use: Secondary | ICD-10-CM | POA: Diagnosis not present

## 2017-09-05 DIAGNOSIS — I272 Pulmonary hypertension, unspecified: Secondary | ICD-10-CM | POA: Diagnosis not present

## 2017-09-05 LAB — PULMONARY FUNCTION TEST
DL/VA % PRED: 30 %
DL/VA: 1.61 ml/min/mmHg/L
DLCO UNC % PRED: 28 %
DLCO unc: 8.34 ml/min/mmHg
FEF 25-75 PRE: 1.07 L/s
FEF 25-75 Post: 0.83 L/sec
FEF2575-%CHANGE-POST: -22 %
FEF2575-%PRED-PRE: 46 %
FEF2575-%Pred-Post: 36 %
FEV1-%Change-Post: -3 %
FEV1-%Pred-Post: 93 %
FEV1-%Pred-Pre: 97 %
FEV1-PRE: 2.34 L
FEV1-Post: 2.25 L
FEV1FVC-%CHANGE-POST: 0 %
FEV1FVC-%PRED-PRE: 78 %
FEV6-%Change-Post: -3 %
FEV6-%Pred-Post: 116 %
FEV6-%Pred-Pre: 120 %
FEV6-Post: 3.46 L
FEV6-Pre: 3.58 L
FEV6FVC-%Change-Post: 0 %
FEV6FVC-%Pred-Post: 97 %
FEV6FVC-%Pred-Pre: 97 %
FVC-%CHANGE-POST: -3 %
FVC-%PRED-PRE: 124 %
FVC-%Pred-Post: 119 %
FVC-POST: 3.67 L
FVC-PRE: 3.8 L
PRE FEV1/FVC RATIO: 62 %
Post FEV1/FVC ratio: 61 %
Post FEV6/FVC ratio: 94 %
Pre FEV6/FVC Ratio: 94 %
RV % pred: 97 %
RV: 2.17 L
TLC % PRED: 107 %
TLC: 6.06 L

## 2017-09-05 MED ORDER — ALBUTEROL SULFATE (2.5 MG/3ML) 0.083% IN NEBU
2.5000 mg | INHALATION_SOLUTION | Freq: Once | RESPIRATORY_TRACT | Status: AC
  Administered 2017-09-05: 2.5 mg via RESPIRATORY_TRACT

## 2017-09-05 NOTE — Progress Notes (Signed)
*  PRELIMINARY RESULTS* Echocardiogram 2D Echocardiogram has been performed.  Kelly Douglas 09/05/2017, 1:49 PM

## 2017-09-06 NOTE — Telephone Encounter (Addendum)
Attempted to call pt. Spoke with a female who states that he will have the pt return our call.

## 2017-09-07 NOTE — Telephone Encounter (Signed)
Attempted to contact pt. I did not receive an answer. There was no option for me to leave a message due to her voicemail not being set up. Will try back.  

## 2017-09-11 NOTE — Telephone Encounter (Signed)
Attempted to contact pt. I did not receive an answer. There was no option for me to leave a message due to her voicemail not being set up. Will try back.  

## 2017-09-12 ENCOUNTER — Encounter: Payer: Self-pay | Admitting: *Deleted

## 2017-09-12 NOTE — Telephone Encounter (Addendum)
I have attempted to contact the other numbers we have on file for the pt and they are non working numbers. I attempted to contact her sister's phone number have on file, I did not receive an answer and I could not leave a message. I have sent a letter the pt to have her call us as soon as she receives the letter.

## 2017-09-14 ENCOUNTER — Ambulatory Visit (HOSPITAL_COMMUNITY)
Admission: RE | Admit: 2017-09-14 | Discharge: 2017-09-14 | Disposition: A | Discharge status: Home / Self Care | Payer: Medicare HMO | Source: Ambulatory Visit | Attending: Family Medicine | Admitting: Family Medicine

## 2017-09-14 ENCOUNTER — Encounter: Payer: Self-pay | Admitting: Family Medicine

## 2017-09-14 ENCOUNTER — Ambulatory Visit (INDEPENDENT_AMBULATORY_CARE_PROVIDER_SITE_OTHER): Payer: Medicare HMO | Admitting: Family Medicine

## 2017-09-14 VITALS — BP 114/74 | HR 95 | Resp 16 | Ht 68.0 in | Wt 152.0 lb

## 2017-09-14 DIAGNOSIS — I10 Essential (primary) hypertension: Secondary | ICD-10-CM | POA: Diagnosis not present

## 2017-09-14 DIAGNOSIS — F419 Anxiety disorder, unspecified: Secondary | ICD-10-CM

## 2017-09-14 DIAGNOSIS — M79671 Pain in right foot: Secondary | ICD-10-CM | POA: Insufficient documentation

## 2017-09-14 DIAGNOSIS — E785 Hyperlipidemia, unspecified: Secondary | ICD-10-CM | POA: Diagnosis not present

## 2017-09-14 DIAGNOSIS — F17218 Nicotine dependence, cigarettes, with other nicotine-induced disorders: Secondary | ICD-10-CM

## 2017-09-14 DIAGNOSIS — F329 Major depressive disorder, single episode, unspecified: Secondary | ICD-10-CM | POA: Diagnosis not present

## 2017-09-14 DIAGNOSIS — Z23 Encounter for immunization: Secondary | ICD-10-CM

## 2017-09-14 DIAGNOSIS — E038 Other specified hypothyroidism: Secondary | ICD-10-CM | POA: Diagnosis not present

## 2017-09-14 DIAGNOSIS — S99921A Unspecified injury of right foot, initial encounter: Secondary | ICD-10-CM | POA: Diagnosis not present

## 2017-09-14 MED ORDER — TRAMADOL HCL 50 MG PO TABS: 50.0000 mg | ORAL_TABLET | Freq: Two times a day (BID) | ORAL | 5 refills | Status: DC

## 2017-09-14 NOTE — Patient Instructions (Addendum)
Annual physical exam first week in December, call if you need me before  Flu vaccine today  Fasting lipid, cmp and eGFr, TSH  And cBc 1 week before next visit ([facility])  Pkls get Xray of right foot today  Tramadol one twice daily and eS tylenol 1 tablet twice daily fior arhtritic pain  Cut back on cigarettes, need to [place]   Call Weylin Plagge  . Steps to Quit Smoking Smoking tobacco can be bad for your health. It can also affect almost every organ in your body. Smoking puts you and people around you at risk for many serious long-lasting (chronic) diseases. Quitting smoking is hard, but it is one of the best things that you can do for your health. It is never too late to quit. What are the benefits of quitting smoking? When you quit smoking, you lower your risk for getting serious diseases and conditions. They can include:  Lung cancer or lung disease.  Heart disease.  Stroke.  Heart attack.  Not being able to have children (infertility).  Weak bones (osteoporosis) and broken bones (fractures).  If you have coughing, wheezing, and shortness of breath, those symptoms may get better when you quit. You may also get sick less often. If you are pregnant, quitting smoking can help to lower your chances of having a baby of low birth weight. What can I do to help me quit smoking? Talk with your doctor about what can help you quit smoking. Some things you can do (strategies) include:  Quitting smoking totally, instead of slowly cutting back how much you smoke over a period of time.  Going to in-person counseling. You are more likely to quit if you go to many counseling sessions.  Using resources and support systems, such as: ? Database administrator with a Social worker. ? Phone quitlines. ? Careers information officer. ? Support groups or group counseling. ? Text messaging programs. ? Mobile phone apps or applications.  Taking medicines. Some of these medicines may have nicotine in them. If  you are pregnant or breastfeeding, do not take any medicines to quit smoking unless your doctor says it is okay. Talk with your doctor about counseling or other things that can help you.  Talk with your doctor about using more than one strategy at the same time, such as taking medicines while you are also going to in-person counseling. This can help make quitting easier. What things can I do to make it easier to quit? Quitting smoking might feel very hard at first, but there is a lot that you can do to make it easier. Take these steps:  Talk to your family and friends. Ask them to support and encourage you.  Call phone quitlines, reach out to support groups, or work with a Social worker.  Ask people who smoke to not smoke around you.  Avoid places that make you want (trigger) to smoke, such as: ? Bars. ? Parties. ? Smoke-break areas at work.  Spend time with people who do not smoke.  Lower the stress in your life. Stress can make you want to smoke. Try these things to help your stress: ? Getting regular exercise. ? Deep-breathing exercises. ? Yoga. ? Meditating. ? Doing a body scan. To do this, close your eyes, focus on one area of your body at a time from head to toe, and notice which parts of your body are tense. Try to relax the muscles in those areas.  Download or buy apps on your mobile phone  or tablet that can help you stick to your quit plan. There are many free apps, such as QuitGuide from the [employer] Office manager for Disease Control and Prevention). You can find more support from smokefree.gov and other websites.  This information is not intended to replace advice given to you by your health care provider. Make sure you discuss any questions you have with your health care provider. Document Released: 10/30/2008 Document Revised: 09/01/2015 Document Reviewed: 05/20/2014 Elsevier Interactive Patient Education  2018 [employer].

## 2017-09-14 NOTE — Assessment & Plan Note (Signed)
After obtaining informed consent, the vaccine is  administered by LPN.  

## 2017-09-14 NOTE — Assessment & Plan Note (Addendum)
Trauma to right foot after she passed out , swelling present and reduced ROM right ankle, X ray ordered

## 2017-09-14 NOTE — Progress Notes (Signed)
   Kelly Douglas     MRN: [id number]      DOB: 06/04/1955   HPI Kelly Douglas is here for follow up and re-evaluation of chronic medical conditions, medication management and review of any available recent lab and radiology data.  Preventive health is updated, specifically  Cancer screening and Immunization.   . The PT denies any adverse reactions to current medications since the last visit.  C/o right ankle and foot  pain and swelling after passing out several weeks ago, she has a long h/o "passing out" from childhood and states she was told this occurs because of increased anxiety  ROS Denies recent fever or chills. Denies sinus pressure, nasal congestion, ear pain or sore throat. Denies chest congestion, productive cough or wheezing. Denies chest pains, palpitations and leg swelling Denies abdominal pain, nausea, vomiting,diarrhea or constipation.   Denies dysuria, frequency, hesitancy or incontinence. Denies headaches, seizures, numbness, or tingling.  Denies skin break down or rash.   PE  BP 114/74   Pulse 95   Resp 16   Ht 5\' 8"  (1.727 m)   Wt 152 lb (68.9 kg)   SpO2 94%   BMI 23.11 kg/m   Patient alert and oriented and in no cardiopulmonary distress.  HEENT: No facial asymmetry, EOMI,   oropharynx pink and moist.  Neck supple no JVD, no mass.  Chest: Clear to auscultation bilaterally.decreased air entry bilaterally  CVS: S1, S2 no murmurs, no S3.Regular rate.  ABD: Soft non tender.   Ext: No edema  MS: Adequate though reduced  ROM spine, shoulders, hips and knees.Swelling and deformity noted of right ankle and foot, diffusely tender  Skin: Intact, no ulcerations or rash noted.  Psych: Good eye contact, normal affect. Memory intact not anxious or depressed appearing.  CNS: CN 2-12 intact, power,  normal throughout.no focal deficits noted.   Assessment & Plan  Foot pain, right Trauma to right foot after she passed out , swelling present and reduced ROM  right ankle, X ray ordered   Need for immunization against influenza After obtaining informed consent, the vaccine is  administered by LPN.   Essential hypertension Controlled, no change in medication DASH diet and commitment to daily physical activity for a minimum of 30 minutes discussed and encouraged, as a part of hypertension management. The importance of attaining a healthy weight is also discussed.  BP/Weight 09/14/2017 08/29/2017 07/18/2017 07/03/2017 05/02/2017 03/28/2017 92/42/6834  Systolic BP 196 222 979 892 119 417 408  Diastolic BP 74 74 82 74 83 78 82  Wt. (Lbs) 152 153 151.6 151.12 156.6 163 159  BMI 23.11 23.26 23.05 22.98 23.13 24.78 24.18       Nicotine dependence Asked: confirms currently smokes 7 cigarette / day Assess: not willing to quit currently Advise: needs to quit to reduce heart disease and stroke risk and cancer risk, has upcoming PET for concern re lung nodules Assist: counseled for 5 mins, literature provided also with tele # Arrange : f/u in 3 months  Hypothyroid Controlled, no change in medication Updated lab needed at/ before next visit.   Anxiety and depression Controlled, no change in medication

## 2017-09-15 ENCOUNTER — Encounter: Payer: Self-pay | Admitting: Family Medicine

## 2017-09-15 NOTE — Assessment & Plan Note (Signed)
Controlled, no change in medication  

## 2017-09-15 NOTE — Assessment & Plan Note (Signed)
Controlled, no change in medication DASH diet and commitment to daily physical activity for a minimum of 30 minutes discussed and encouraged, as a part of hypertension management. The importance of attaining a healthy weight is also discussed.  BP/Weight 09/14/2017 08/29/2017 07/18/2017 07/03/2017 05/02/2017 03/28/2017 31/59/4585  Systolic BP 929 244 628 638 177 116 579  Diastolic BP 74 74 82 74 83 78 82  Wt. (Lbs) 152 153 151.6 151.12 156.6 163 159  BMI 23.11 23.26 23.05 22.98 23.13 24.78 24.18

## 2017-09-15 NOTE — Assessment & Plan Note (Addendum)
Asked: confirms currently smokes 7 cigarette / day Assess: not willing to quit currently Advise: needs to quit to reduce heart disease and stroke risk and cancer risk, has upcoming PET for concern re lung nodules Assist: counseled for 5 mins, literature provided also with tele # Arrange : f/u in 3 months

## 2017-09-15 NOTE — Assessment & Plan Note (Signed)
Controlled, no change in medication Updated lab needed at/ before next visit.  

## 2017-09-20 ENCOUNTER — Ambulatory Visit: Payer: Medicare HMO | Admitting: Emergency Medicine

## 2017-09-22 ENCOUNTER — Other Ambulatory Visit: Payer: Self-pay | Admitting: Family Medicine

## 2017-09-22 DIAGNOSIS — M549 Dorsalgia, unspecified: Secondary | ICD-10-CM

## 2017-10-02 ENCOUNTER — Ambulatory Visit (HOSPITAL_COMMUNITY)
Admission: RE | Admit: 2017-10-02 | Discharge: 2017-10-02 | Disposition: A | Discharge status: Home / Self Care | Payer: Medicare HMO | Source: Ambulatory Visit | Attending: Emergency Medicine | Admitting: Emergency Medicine

## 2017-10-02 DIAGNOSIS — R918 Other nonspecific abnormal finding of lung field: Secondary | ICD-10-CM | POA: Diagnosis not present

## 2017-10-02 DIAGNOSIS — I700 Atherosclerosis of aorta: Secondary | ICD-10-CM | POA: Insufficient documentation

## 2017-10-02 DIAGNOSIS — R911 Solitary pulmonary nodule: Secondary | ICD-10-CM | POA: Diagnosis not present

## 2017-10-02 DIAGNOSIS — I2510 Atherosclerotic heart disease of native coronary artery without angina pectoris: Secondary | ICD-10-CM | POA: Insufficient documentation

## 2017-10-02 MED ORDER — FLUDEOXYGLUCOSE F - 18 (FDG) INJECTION
10.0000 | Freq: Once | INTRAVENOUS | Status: AC | PRN
  Administered 2017-10-02: 9.35 via INTRAVENOUS

## 2017-10-04 DIAGNOSIS — F17200 Nicotine dependence, unspecified, uncomplicated: Secondary | ICD-10-CM | POA: Diagnosis not present

## 2017-10-04 DIAGNOSIS — M349 Systemic sclerosis, unspecified: Secondary | ICD-10-CM | POA: Diagnosis not present

## 2017-10-04 DIAGNOSIS — I10 Essential (primary) hypertension: Secondary | ICD-10-CM | POA: Diagnosis not present

## 2017-10-04 DIAGNOSIS — J449 Chronic obstructive pulmonary disease, unspecified: Secondary | ICD-10-CM | POA: Diagnosis not present

## 2017-10-06 ENCOUNTER — Ambulatory Visit: Payer: Medicare HMO | Admitting: Emergency Medicine

## 2017-10-10 ENCOUNTER — Ambulatory Visit: Payer: Medicare HMO | Admitting: Emergency Medicine

## 2017-10-10 ENCOUNTER — Ambulatory Visit (INDEPENDENT_AMBULATORY_CARE_PROVIDER_SITE_OTHER): Payer: Medicare HMO | Admitting: Emergency Medicine

## 2017-10-10 ENCOUNTER — Encounter: Payer: Self-pay | Admitting: Emergency Medicine

## 2017-10-10 VITALS — BP 132/78 | HR 101 | Ht 68.0 in | Wt 153.0 lb

## 2017-10-10 DIAGNOSIS — M349 Systemic sclerosis, unspecified: Secondary | ICD-10-CM | POA: Diagnosis not present

## 2017-10-10 DIAGNOSIS — J449 Chronic obstructive pulmonary disease, unspecified: Secondary | ICD-10-CM | POA: Diagnosis not present

## 2017-10-10 DIAGNOSIS — R6889 Other general symptoms and signs: Secondary | ICD-10-CM | POA: Diagnosis not present

## 2017-10-10 DIAGNOSIS — R918 Other nonspecific abnormal finding of lung field: Secondary | ICD-10-CM

## 2017-10-10 NOTE — Assessment & Plan Note (Signed)
Based on the fact she has multiple nodules I think that best strategy would be to attempt biopsy via navigational bronchoscopy.  I considered pursuing axillary lymph node biopsy(ies) but I think it would be quite unusual for those notes to be so enlarged and with a significant amount of hypermetabolism given the size and minimal hypermetabolism of the nodules themselves.  I suspect that these are different processes.  We will try to arrange for navigational bronchoscopy next week

## 2017-10-10 NOTE — Assessment & Plan Note (Signed)
Not currently on immunosuppressive therapy

## 2017-10-10 NOTE — Progress Notes (Signed)
Subjective:    Patient ID: Kelly Douglas, female    DOB: Jul 04, 1955, 62 y.o.   MRN: [id number]  HPI 62 year old smoker (45+ pack years) with a history of scleroderma, coronary artery disease, chronic abnormalities on CT scan of the chest that have been characterized by scarring and possible mycobacterial disease.  She has undergone low-dose CT scan lung cancer screening, most recent on 11/01/16.  This study showed overall stability of most of her pulmonary nodular disease but with some interval increase in size of an 11.3 mm irregular central left lower lobe nodule.  This finding prompted a PET scan that was done on 11/17/16 that I have reviewed.  This study shows minimal but discernible FDG uptake in the nodule in question (SUV 1.5). She has been on prednisone before, not currently, last was probably 6 months ago. She has cut down to 1/2 a pack a day. Denies significant cough, chest pain, sputum. No hemoptysis.    ROV 08/29/17 --this is a follow-up visit for patient with a history of tobacco use, scleroderma (on no meds), coronary artery disease.  She has moderate obstruction with a positive bronchodilator response by pulmonary function testing 04/24/2015 that I reviewed.  We have been following chronic scarring and nodular disease by CT scan of the chest, particularly and an 11 mm irregular central left lower lobe nodule that showed minimal hypermetabolism by PET scan 11/17/2016.  Most recently she underwent a CT scan of the chest on 05/18/2017 that I reviewed.  This again showed a slight interval increase in her left lower lobe nodule, now 1.1 x 1.0 cm.  Other scattered nodules in the left lower lobe and the left upper lobe, right lower lobe were stable in size. Very rare albuterol use. Not on scheduled BD's. No flares. She remains on singulair   FEV1 79% pred 2017  ROV 10/10/17 --62 year old smoker with 45-pack-year history, scleroderma, coronary disease and an abnormal CT scan of the chest.  We have  been following pulmonary nodule disease principally left side with serial scans including a PET scan that was done last year.  Given its enlargement I repeated a PET scan on 10/03/2017 as below.  Repeat PET scan done on 10/03/2017 was reviewed by me.  This shows redemonstration of low level hypermetabolism in the slowly enlarging left lower lobe pulmonary nodule, hypermetabolism within the enlarging left greater than right axillary nodes and some low level hypermetabolism in the inguinal nodes.  Pulmonary function testing done 09/05/2017 reviewed by me and showed mild obstructive lung disease without a bronchodilator response, normal lung volumes and a decreased diffusion capacity.  She denies any new dyspnea, has minimal cough. Never hemoptysis.    Review of Systems  HENT: Positive for dental problem, nosebleeds and sinus pressure.   Respiratory: Positive for shortness of breath.   Allergic/Immunologic: Negative.   Psychiatric/Behavioral: The patient is nervous/anxious.    Past Medical History:  Diagnosis Date  . Anxiety   . Arteriosclerotic cardiovascular disease (ASCVD) 2013   coronary calcification-left main, LAD and CX; small pericardial effusion  . Arthritis   . Chronic back pain   . Chronic bronchitis   . Chronic lung disease    Chronic scarring and volume loss-left lung; characteristics of a chronic infectious process-possible MAI  . Depression   . GERD (gastroesophageal reflux disease)    Dr Gala Romney EGD 09/2009->esophagitis, sm HH, antral erosions, atonic esophagus  . Hyperlipidemia   . Hypothyroid 1981 approx  . Scleroderma ([place])   . Seasonal  allergies   . Syncope    Multiple spells over the past 40+ years, likely neurocardiogenic  . Tobacco abuse 06/24/2009     Family History  Problem Relation Age of Onset  . Stroke Father   . Aneurysm Father   . Coronary artery disease Brother   . Hypertension Brother   . Down syndrome Brother   . Hypertension Brother   . Hypertension  Brother        father/mother  . Aneurysm Mother   . Throat cancer Brother 49       throat cancer  . Hypertension Sister   . Diabetes Brother   . Diabetes Brother   . Anesthesia problems Neg Hx   . Hypotension Neg Hx   . Malignant hyperthermia Neg Hx   . Pseudochol deficiency Neg Hx   . Colon cancer Neg Hx      Social History   Socioeconomic History  . Marital status: Married    Spouse name: Not on file  . Number of children: 2  . Years of education: Not on file  . Highest education level: Not on file  Occupational History  . Occupation: disabled    Fish farm manager: NOT EMPLOYED  Social Needs  . Financial resource strain: Very hard  . Food insecurity:    Worry: Sometimes true    Inability: Sometimes true  . Transportation needs:    Medical: No    Non-medical: No  Tobacco Use  . Smoking status: Current Every Day Smoker    Packs/day: 0.50    Years: 45.00    Pack years: 22.50    Types: Cigarettes    Start date: 07/14/1971  . Smokeless tobacco: Never Used  . Tobacco comment: 0.5 PPD current plans to reduce   Substance and Sexual Activity  . Alcohol use: No    Alcohol/week: 0.0 standard drinks    Comment: quit June 2012-used to drink 6 pack daily  . Drug use: No  . Sexual activity: Yes    Birth control/protection: Post-menopausal  Lifestyle  . Physical activity:    Days per week: 2 days    Minutes per session: 20 min  . Stress: Not at all  Relationships  . Social connections:    Talks on phone: Never    Gets together: More than three times a week    Attends religious service: More than 4 times per year    Active member of club or organization: No    Attends meetings of clubs or organizations: Never    Relationship status: Married  . Intimate partner violence:    Fear of current or ex partner: No    Emotionally abused: No    Physically abused: No    Forced sexual activity: No  Other Topics Concern  . Not on file  Social History Narrative  . Not on file      Allergies  Allergen Reactions  . Aspirin Other (See Comments)    Nose bleeds     Outpatient Medications Prior to Visit  Medication Sig Dispense Refill  . Acetaminophen (TYLENOL ARTHRITIS PAIN PO) Take 2 tablets by mouth 3 (three) times daily.    Marland Kitchen albuterol (PROVENTIL HFA;VENTOLIN HFA) 108 (90 BASE) MCG/ACT inhaler Inhale 2 puffs into the lungs every 6 (six) hours as needed. Shortness of breath 6.7 g 3  . amLODipine (NORVASC) 5 MG tablet TAKE 1 TABLET EVERY DAY 90 tablet 1  . busPIRone (BUSPAR) 10 MG tablet Take 1 tablet (10 mg total) by mouth 3 (  three) times daily. 270 tablet 1  . dicyclomine (BENTYL) 10 MG capsule TAKE 1 CAPSULE THREE TIMES DAILY WITH MEALS AS NEEDED FOR  SPASMS 120 capsule 3  . DULoxetine (CYMBALTA) 60 MG capsule Take 1 capsule (60 mg total) by mouth daily. 90 capsule 3  . gabapentin (NEURONTIN) 600 MG tablet TAKE 1 TABLET TWICE DAILY  (DOSE  REDUCTION) 180 tablet 3  . levothyroxine (SYNTHROID, LEVOTHROID) 75 MCG tablet TAKE 1 TABLET EVERY DAY 90 tablet 1  . meloxicam (MOBIC) 7.5 MG tablet Take 1 tablet (7.5 mg total) daily by mouth. 180 tablet 5  . montelukast (SINGULAIR) 10 MG tablet TAKE 1 TABLET AT BEDTIME 90 tablet 1  . Multiple Vitamins-Calcium (ONE-A-DAY WOMENS PO) Take 1 tablet by mouth daily.    . pantoprazole (PROTONIX) 40 MG tablet TAKE 1 TABLET TWICE DAILY 180 tablet 5  . potassium chloride (K-DUR) 10 MEQ tablet Take 1 tablet (10 mEq total) by mouth daily. 180 tablet 1  . pravastatin (PRAVACHOL) 40 MG tablet TAKE 1 TABLET EVERY DAY 90 tablet 1  . traMADol (ULTRAM) 50 MG tablet Take 1 tablet (50 mg total) by mouth 2 (two) times daily. 60 tablet 5   No facility-administered medications prior to visit.         Objective:   Physical Exam Vitals:   10/10/17 1404  BP: 132/78  Pulse: (!) 101  SpO2: 91%  Weight: 153 lb (69.4 kg)  Height: 5\' 8"  (1.727 m)   Gen: Pleasant, well-nourished, in no distress,  normal affect  ENT: No lesions,  mouth clear,   oropharynx clear, no postnasal drip  Neck: No JVD, no stridor  Lungs: No use of accessory muscles, clear without rales or rhonchi  Cardiovascular: RRR, heart sounds normal, no murmur or gallops, no peripheral edema  Musculoskeletal: No deformities, no cyanosis or clubbing  Neuro: alert, non focal  Skin: Some sclerodactyly and hyperpigmentation changes on her bilateral hands      Assessment & Plan:  COPD (chronic obstructive pulmonary disease) (HCC) Continue albuterol as needed.  No clear indication for maintenance medications at this time.  Her most recent spirometry was evaluated, does show mild obstruction, normal volumes and decreased diffusion.  We will check a walking oximetry today.  Scleroderma Not currently on immunosuppressive therapy  Pulmonary nodules Based on the fact she has multiple nodules I think that best strategy would be to attempt biopsy via navigational bronchoscopy.  I considered pursuing axillary lymph node biopsy(ies) but I think it would be quite unusual for those notes to be so enlarged and with a significant amount of hypermetabolism given the size and minimal hypermetabolism of the nodules themselves.  I suspect that these are different processes.  We will try to arrange for navigational bronchoscopy next week  Baltazar Apo, MD, PhD 10/10/2017, 2:26 PM [facility] [phone] or if no answer [phone]

## 2017-10-10 NOTE — Assessment & Plan Note (Signed)
Continue albuterol as needed.  No clear indication for maintenance medications at this time.  Her most recent spirometry was evaluated, does show mild obstruction, normal volumes and decreased diffusion.  We will check a walking oximetry today.

## 2017-10-10 NOTE — H&P (View-Only) (Signed)
Subjective:    Patient ID: Kelly Douglas, female    DOB: 1955-10-27, 62 y.o.   MRN: [id number]  HPI 62 year old smoker (45+ pack years) with a history of scleroderma, coronary artery disease, chronic abnormalities on CT scan of the chest that have been characterized by scarring and possible mycobacterial disease.  She has undergone low-dose CT scan lung cancer screening, most recent on 11/01/16.  This study showed overall stability of most of her pulmonary nodular disease but with some interval increase in size of an 11.3 mm irregular central left lower lobe nodule.  This finding prompted a PET scan that was done on 11/17/16 that I have reviewed.  This study shows minimal but discernible FDG uptake in the nodule in question (SUV 1.5). She has been on prednisone before, not currently, last was probably 6 months ago. She has cut down to 1/2 a pack a day. Denies significant cough, chest pain, sputum. No hemoptysis.    ROV 08/29/17 --this is a follow-up visit for patient with a history of tobacco use, scleroderma (on no meds), coronary artery disease.  She has moderate obstruction with a positive bronchodilator response by pulmonary function testing 04/24/2015 that I reviewed.  We have been following chronic scarring and nodular disease by CT scan of the chest, particularly and an 11 mm irregular central left lower lobe nodule that showed minimal hypermetabolism by PET scan 11/17/2016.  Most recently she underwent a CT scan of the chest on 05/18/2017 that I reviewed.  This again showed a slight interval increase in her left lower lobe nodule, now 1.1 x 1.0 cm.  Other scattered nodules in the left lower lobe and the left upper lobe, right lower lobe were stable in size. Very rare albuterol use. Not on scheduled BD's. No flares. She remains on singulair   FEV1 79% pred 2017  ROV 10/10/17 --62 year old smoker with 45-pack-year history, scleroderma, coronary disease and an abnormal CT scan of the chest.  We have  been following pulmonary nodule disease principally left side with serial scans including a PET scan that was done last year.  Given its enlargement I repeated a PET scan on 10/03/2017 as below.  Repeat PET scan done on 10/03/2017 was reviewed by me.  This shows redemonstration of low level hypermetabolism in the slowly enlarging left lower lobe pulmonary nodule, hypermetabolism within the enlarging left greater than right axillary nodes and some low level hypermetabolism in the inguinal nodes.  Pulmonary function testing done 09/05/2017 reviewed by me and showed mild obstructive lung disease without a bronchodilator response, normal lung volumes and a decreased diffusion capacity.  She denies any new dyspnea, has minimal cough. Never hemoptysis.    Review of Systems  HENT: Positive for dental problem, nosebleeds and sinus pressure.   Respiratory: Positive for shortness of breath.   Allergic/Immunologic: Negative.   Psychiatric/Behavioral: The patient is nervous/anxious.    Past Medical History:  Diagnosis Date  . Anxiety   . Arteriosclerotic cardiovascular disease (ASCVD) 2013   coronary calcification-left main, LAD and CX; small pericardial effusion  . Arthritis   . Chronic back pain   . Chronic bronchitis   . Chronic lung disease    Chronic scarring and volume loss-left lung; characteristics of a chronic infectious process-possible MAI  . Depression   . GERD (gastroesophageal reflux disease)    Dr Gala Romney EGD 09/2009->esophagitis, sm HH, antral erosions, atonic esophagus  . Hyperlipidemia   . Hypothyroid 1981 approx  . Scleroderma ([place])   . Seasonal  allergies   . Syncope    Multiple spells over the past 40+ years, likely neurocardiogenic  . Tobacco abuse 06/24/2009     Family History  Problem Relation Age of Onset  . Stroke Father   . Aneurysm Father   . Coronary artery disease Brother   . Hypertension Brother   . Down syndrome Brother   . Hypertension Brother   . Hypertension  Brother        father/mother  . Aneurysm Mother   . Throat cancer Brother 49       throat cancer  . Hypertension Sister   . Diabetes Brother   . Diabetes Brother   . Anesthesia problems Neg Hx   . Hypotension Neg Hx   . Malignant hyperthermia Neg Hx   . Pseudochol deficiency Neg Hx   . Colon cancer Neg Hx      Social History   Socioeconomic History  . Marital status: Married    Spouse name: Not on file  . Number of children: 2  . Years of education: Not on file  . Highest education level: Not on file  Occupational History  . Occupation: disabled    Fish farm manager: NOT EMPLOYED  Social Needs  . Financial resource strain: Very hard  . Food insecurity:    Worry: Sometimes true    Inability: Sometimes true  . Transportation needs:    Medical: No    Non-medical: No  Tobacco Use  . Smoking status: Current Every Day Smoker    Packs/day: 0.50    Years: 45.00    Pack years: 22.50    Types: Cigarettes    Start date: 07/14/1971  . Smokeless tobacco: Never Used  . Tobacco comment: 0.5 PPD current plans to reduce   Substance and Sexual Activity  . Alcohol use: No    Alcohol/week: 0.0 standard drinks    Comment: quit June 2012-used to drink 6 pack daily  . Drug use: No  . Sexual activity: Yes    Birth control/protection: Post-menopausal  Lifestyle  . Physical activity:    Days per week: 2 days    Minutes per session: 20 min  . Stress: Not at all  Relationships  . Social connections:    Talks on phone: Never    Gets together: More than three times a week    Attends religious service: More than 4 times per year    Active member of club or organization: No    Attends meetings of clubs or organizations: Never    Relationship status: Married  . Intimate partner violence:    Fear of current or ex partner: No    Emotionally abused: No    Physically abused: No    Forced sexual activity: No  Other Topics Concern  . Not on file  Social History Narrative  . Not on file      Allergies  Allergen Reactions  . Aspirin Other (See Comments)    Nose bleeds     Outpatient Medications Prior to Visit  Medication Sig Dispense Refill  . Acetaminophen (TYLENOL ARTHRITIS PAIN PO) Take 2 tablets by mouth 3 (three) times daily.    Marland Kitchen albuterol (PROVENTIL HFA;VENTOLIN HFA) 108 (90 BASE) MCG/ACT inhaler Inhale 2 puffs into the lungs every 6 (six) hours as needed. Shortness of breath 6.7 g 3  . amLODipine (NORVASC) 5 MG tablet TAKE 1 TABLET EVERY DAY 90 tablet 1  . busPIRone (BUSPAR) 10 MG tablet Take 1 tablet (10 mg total) by mouth 3 (  three) times daily. 270 tablet 1  . dicyclomine (BENTYL) 10 MG capsule TAKE 1 CAPSULE THREE TIMES DAILY WITH MEALS AS NEEDED FOR  SPASMS 120 capsule 3  . DULoxetine (CYMBALTA) 60 MG capsule Take 1 capsule (60 mg total) by mouth daily. 90 capsule 3  . gabapentin (NEURONTIN) 600 MG tablet TAKE 1 TABLET TWICE DAILY  (DOSE  REDUCTION) 180 tablet 3  . levothyroxine (SYNTHROID, LEVOTHROID) 75 MCG tablet TAKE 1 TABLET EVERY DAY 90 tablet 1  . meloxicam (MOBIC) 7.5 MG tablet Take 1 tablet (7.5 mg total) daily by mouth. 180 tablet 5  . montelukast (SINGULAIR) 10 MG tablet TAKE 1 TABLET AT BEDTIME 90 tablet 1  . Multiple Vitamins-Calcium (ONE-A-DAY WOMENS PO) Take 1 tablet by mouth daily.    . pantoprazole (PROTONIX) 40 MG tablet TAKE 1 TABLET TWICE DAILY 180 tablet 5  . potassium chloride (K-DUR) 10 MEQ tablet Take 1 tablet (10 mEq total) by mouth daily. 180 tablet 1  . pravastatin (PRAVACHOL) 40 MG tablet TAKE 1 TABLET EVERY DAY 90 tablet 1  . traMADol (ULTRAM) 50 MG tablet Take 1 tablet (50 mg total) by mouth 2 (two) times daily. 60 tablet 5   No facility-administered medications prior to visit.         Objective:   Physical Exam Vitals:   10/10/17 1404  BP: 132/78  Pulse: (!) 101  SpO2: 91%  Weight: 153 lb (69.4 kg)  Height: 5\' 8"  (1.727 m)   Gen: Pleasant, well-nourished, in no distress,  normal affect  ENT: No lesions,  mouth clear,   oropharynx clear, no postnasal drip  Neck: No JVD, no stridor  Lungs: No use of accessory muscles, clear without rales or rhonchi  Cardiovascular: RRR, heart sounds normal, no murmur or gallops, no peripheral edema  Musculoskeletal: No deformities, no cyanosis or clubbing  Neuro: alert, non focal  Skin: Some sclerodactyly and hyperpigmentation changes on her bilateral hands      Assessment & Plan:  COPD (chronic obstructive pulmonary disease) (HCC) Continue albuterol as needed.  No clear indication for maintenance medications at this time.  Her most recent spirometry was evaluated, does show mild obstruction, normal volumes and decreased diffusion.  We will check a walking oximetry today.  Scleroderma Not currently on immunosuppressive therapy  Pulmonary nodules Based on the fact she has multiple nodules I think that best strategy would be to attempt biopsy via navigational bronchoscopy.  I considered pursuing axillary lymph node biopsy(ies) but I think it would be quite unusual for those notes to be so enlarged and with a significant amount of hypermetabolism given the size and minimal hypermetabolism of the nodules themselves.  I suspect that these are different processes.  We will try to arrange for navigational bronchoscopy next week  Baltazar Apo, MD, PhD 10/10/2017, 2:26 PM [facility] [phone] or if no answer [phone]

## 2017-10-10 NOTE — Patient Instructions (Signed)
We will work on setting up a navigational bronchoscopy to biopsy her left upper lobe pulmonary nodules. Please keep your albuterol available to use 2 puffs up to every 4 hours if needed for shortness of breath, chest tightness, wheezing. Follow with Dr Lamonte Sakai in 1 month

## 2017-10-16 ENCOUNTER — Encounter (HOSPITAL_COMMUNITY): Payer: Self-pay | Admitting: *Deleted

## 2017-10-16 NOTE — Progress Notes (Signed)
Report shob with exertion that is her normal. Denies CP. State last visit with Dr. Harl Bowie in 2016. Sent to anesthesia for review.

## 2017-10-17 NOTE — Progress Notes (Signed)
Anesthesia Chart Review: Kathleene Hazel  Case:  [id number] Date/Time:  10/18/17 0815   Procedure:  VIDEO BRONCHOSCOPY WITH ENDOBRONCHIAL NAVIGATION (N/A )   Anesthesia type:  General   Pre-op diagnosis:  lung mass   Location:  [facility] ROOM 10 / [facility]   Surgeon:  Collene Gobble, MD      DISCUSSION: Patient is a 62 year old female scheduled for the above procedure.   History includes smoking, scleroderma, hypothyroidism, syncope (recurrent, likely neurocardiogenic), HLD, HTN, dyspnea, GERD. She has known coronary atherosclerosis on chest CT since at least 2013 (no stress test ordered at cardiology visits 05/05/11, 08/19/14).  Patient with history of chronic chest CT abnormalities (multiple nodules) previously characterized by scarring and possible mycobacterial disease, but serial chest CT has shown some interval increase in size of a central LLL nodule--low level hypermetabolic by PET scan 8/67/61.    Per Dr. Baldwin Crown 10/10/17 note, "Based on the fact she has multiple nodules I think that best strategy would be to attempt biopsy via navigational bronchoscopy.  I considered pursuing axillary lymph node biopsy(ies) but I think it would be quite unusual for those notes to be so enlarged and with a significant amount of hypermetabolism given the size and minimal hypermetabolism of the nodules themselves.  I suspect that these are different processes.  We will try to arrange for navigational bronchoscopy next week."  Per PAT phone RN, she reported stable chronic exertional dyspnea and denied chest pain. Reviewed available information with anesthesiologist Albertha Ghee, MD. If patient remains asymptomatic from a CV standpoint then it is anticipated that she can proceed as planned; however, definitive plan will depend on patient evaluation and EKG findings. Also for labs on arrival. Anesthesiologist to evaluate on the day of surgery.    PROVIDERS: [name], MD is PCP, last visit 09/15/17. Carlyle Dolly, MD is cardiologist. Last visit 08/19/14 due to findings of coronary atherosclerosis on chest CT (noted dating back to 2013 and saw Jacqulyn Ducking, MD then) and history of recurrent syncope (since a young age). Normal EF by echo. No history of stress or cath. No arrhythmias on event monitor. Continued risk factor modification for CAD recommended. No cardiac cause for syncope identified. Follow-up planned, but never happened.    LABS: She is for labs on the day of surgery. Last Cr 0.83 04/16/17.  PFTs 09/05/17: FVC 3.80 (124%), FEV1 2.34 (97%), DLCO unc 8.34 (28%).  Spirometry shows no ventilatory defect but does show airflow obstruction. No BD response. Lung volumes and airway resistance are normal. DLCO severely reduced.   IMAGES: PET scan 10/02/17: IMPRESSION: 1. Redemonstration of low-level hypermetabolism within is slowly enlarging central left lower lobe pulmonary nodule. This remains suspicious for an indolent primary bronchogenic carcinoma. 2. Hypermetabolism within enlarging left greater than right axillary nodes. Not typical of metastatic disease from indolent primary bronchogenic carcinoma. Differential considerations include reactive etiologies or an early lymphoproliferative process such as lymphoma. 3. Similar low-level hypermetabolism within inguinal nodes, favored to be reactive. 4. Esophageal air fluid level suggests dysmotility or gastroesophageal reflux. 5. Age advanced coronary artery atherosclerosis. Recommend assessment of coronary risk factors and consideration of medical therapy. 6.  Aortic Atherosclerosis (ICD10-I70.0).  CT Chest Super D 05/18/17: IMPRESSION: 1. Continued slight interval increase in size of central left lower lobe pulmonary nodule concerning for the possibility of low-grade neoplasm. Recommend continued attention on follow-up if sampling is not obtained. 2. Additional pulmonary nodules within the left lung as described above. Recommend  attention on follow-up. 3. Aortic Atherosclerosis (ICD10-I70.0) and Emphysema (ICD10-J43.9).   EKG: Last EKG seen is from 2016, so she will need an updated EKG on the day of surgery.   CV: Echo 09/05/17: Study Conclusions - Left ventricle: The cavity size was normal. Wall thickness was   normal. Systolic function was normal. The estimated ejection   fraction was in the range of 60% to 65%. Abnormal diastolic   function, indeterminant grade. Wall motion was normal; there were   no regional wall motion abnormalities. - Aortic valve: Valve area (VTI): 1.86 cm^2. Valve area (Vmax):   1.84 cm^2. Valve area (Vmean): 1.97 cm^2. - Technically adequate study.   Past Medical History:  Diagnosis Date  . Anxiety   . Arteriosclerotic cardiovascular disease (ASCVD) 2013   coronary calcification-left main, LAD and CX; small pericardial effusion  . Arthritis   . Chronic back pain   . Chronic bronchitis   . Chronic lung disease    Chronic scarring and volume loss-left lung; characteristics of a chronic infectious process-possible MAI  . Depression   . Dyspnea   . GERD (gastroesophageal reflux disease)    Dr Gala Romney EGD 09/2009->esophagitis, sm HH, antral erosions, atonic esophagus  . Hyperlipidemia   . Hypertension   . Hypothyroid 1981 approx  . Scleroderma ([place])   . Seasonal allergies   . Syncope    Multiple spells over the past 40+ years, likely neurocardiogenic  . Tobacco abuse 06/24/2009    Past Surgical History:  Procedure Laterality Date  . BILATERAL SALPINGOOPHORECTOMY  2013    Dr. Elonda Husky; uterus remains in situ  . COLONOSCOPY  09/28/09   anal papilla otherwise normal  . COLONOSCOPY N/A 08/29/2012   LNL:[id number] polyp-removed as described above. tubular adenoma  . ESOPHAGOGASTRODUODENOSCOPY  10/07/09   Dr. Barrie Dunker of esophageal mucosa, diffusely ?esophagitis (bx benign), small HH/antral erosions, erythema bx benign. 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  ESOPHAGOGASTRODUODENOSCOPY (EGD) WITH ESOPHAGEAL DILATION N/A 03/07/2012   RDE:[id number], patent, tubular esophagus of uncertain significance-status post biopsy )unremarkable). Hiatal hernia  . LAPAROSCOPIC APPENDECTOMY  07/16/2011   Procedure: APPENDECTOMY LAPAROSCOPIC;  Surgeon: Donato Heinz, MD;  Location: [facility];  Service: General;  Laterality: N/A;  . TUBAL LIGATION      MEDICATIONS: No current facility-administered medications for this encounter.    Marland Kitchen acetaminophen (TYLENOL) 650 MG CR tablet  . albuterol (PROVENTIL HFA;VENTOLIN HFA) 108 (90 BASE) MCG/ACT inhaler  . amLODipine (NORVASC) 5 MG tablet  . busPIRone (BUSPAR) 10 MG tablet  . cholecalciferol (VITAMIN D) 1000 units tablet  . DULoxetine (CYMBALTA) 60 MG capsule  . gabapentin (NEURONTIN) 600 MG tablet  . levothyroxine (SYNTHROID, LEVOTHROID) 75 MCG tablet  . Lidocaine-Menthol (ICY HOT LIDOCAINE PLUS MENTHOL) 4-1 % PTCH  . loperamide (IMODIUM A-D) 2 MG tablet  . meloxicam (MOBIC) 7.5 MG tablet  . montelukast (SINGULAIR) 10 MG tablet  . Multiple Vitamins-Calcium (ONE-A-DAY WOMENS PO)  . pantoprazole (PROTONIX) 40 MG tablet  . potassium chloride (K-DUR) 10 MEQ tablet  . pravastatin (PRAVACHOL) 40 MG tablet  . traMADol (ULTRAM) 50 MG tablet  . dicyclomine (BENTYL) 10 MG capsule    [facility] Short Stay Center/Anesthesiology Phone [phone] 10/17/2017 12:39 PM

## 2017-10-17 NOTE — Anesthesia Preprocedure Evaluation (Addendum)
Anesthesia Evaluation  Patient identified by MRN, date of birth, ID band Patient awake    Reviewed: Allergy & Precautions, NPO status , Patient's Chart, lab work & pertinent test results  Airway Mallampati: II  TM Distance: >3 FB Neck ROM: Full    Dental  (+) Dental Advisory Given, Poor Dentition, Chipped   Pulmonary COPD,  COPD inhaler, Current Smoker,  Chronic lung disease    Pulmonary exam normal breath sounds clear to auscultation       Cardiovascular hypertension, Pt. on medications (-) angina+ CAD  (-) Past MI Normal cardiovascular exam Rhythm:Regular Rate:Normal  Echo 09/05/17: Study Conclusions  - Left ventricle: The cavity size was normal. Wall thickness was normal. Systolic function was normal. The estimated ejection fraction was in the range of 60% to 65%. Abnormal diastolic function, indeterminant grade. Wall motion was normal; there were no regional wall motion abnormalities. - Aortic valve: Valve area (VTI): 1.86 cm^2. Valve area (Vmax): 1.84 cm^2. Valve area (Vmean): 1.97 cm^2. - Technically adequate study.   Neuro/Psych PSYCHIATRIC DISORDERS Anxiety Depression negative neurological ROS     GI/Hepatic Neg liver ROS, hiatal hernia, GERD  Medicated,  Endo/Other  Hypothyroidism   Renal/GU Renal disease     Musculoskeletal  (+) Arthritis , Osteoarthritis,  Scleroderma    Abdominal   Peds  Hematology negative hematology ROS (+)   Anesthesia Other Findings Day of surgery medications reviewed with the patient.  Reproductive/Obstetrics                          Anesthesia Physical Anesthesia Plan  ASA: III  Anesthesia Plan: General   Post-op Pain Management:    Induction: Intravenous  PONV Risk Score and Plan: 2 and Ondansetron and Dexamethasone  Airway Management Planned: Oral ETT  Additional Equipment:   Intra-op Plan:   Post-operative Plan: Extubation in  OR  Informed Consent: I have reviewed the patients History and Physical, chart, labs and discussed the procedure including the risks, benefits and alternatives for the proposed anesthesia with the patient or authorized representative who has indicated his/her understanding and acceptance.   Dental advisory given  Plan Discussed with: CRNA, Anesthesiologist and Surgeon  Anesthesia Plan Comments:       Anesthesia Quick Evaluation

## 2017-10-18 ENCOUNTER — Inpatient Hospital Stay (HOSPITAL_COMMUNITY): Payer: Medicare HMO

## 2017-10-18 ENCOUNTER — Encounter (HOSPITAL_COMMUNITY): Payer: Self-pay | Admitting: *Deleted

## 2017-10-18 ENCOUNTER — Ambulatory Visit (HOSPITAL_COMMUNITY): Payer: Medicare HMO | Admitting: Vascular Surgery

## 2017-10-18 ENCOUNTER — Ambulatory Visit (HOSPITAL_COMMUNITY): Payer: Medicare HMO

## 2017-10-18 ENCOUNTER — Inpatient Hospital Stay (HOSPITAL_COMMUNITY)
Admission: RE | Admit: 2017-10-18 | Discharge: 2017-10-19 | DRG: 168 | Disposition: A | Discharge status: Home / Self Care | Payer: Medicare HMO | Attending: Pulmonary Disease | Admitting: Pulmonary Disease

## 2017-10-18 ENCOUNTER — Encounter (HOSPITAL_COMMUNITY)
Admission: RE | Disposition: A | Discharge status: Home / Self Care | Payer: Self-pay | Source: Home / Self Care | Attending: Emergency Medicine

## 2017-10-18 ENCOUNTER — Other Ambulatory Visit: Payer: Self-pay

## 2017-10-18 DIAGNOSIS — J302 Other seasonal allergic rhinitis: Secondary | ICD-10-CM | POA: Diagnosis present

## 2017-10-18 DIAGNOSIS — Z8279 Family history of other congenital malformations, deformations and chromosomal abnormalities: Secondary | ICD-10-CM | POA: Diagnosis not present

## 2017-10-18 DIAGNOSIS — Z833 Family history of diabetes mellitus: Secondary | ICD-10-CM

## 2017-10-18 DIAGNOSIS — I2510 Atherosclerotic heart disease of native coronary artery without angina pectoris: Secondary | ICD-10-CM | POA: Diagnosis present

## 2017-10-18 DIAGNOSIS — J939 Pneumothorax, unspecified: Secondary | ICD-10-CM | POA: Diagnosis not present

## 2017-10-18 DIAGNOSIS — J8410 Pulmonary fibrosis, unspecified: Secondary | ICD-10-CM | POA: Diagnosis not present

## 2017-10-18 DIAGNOSIS — G8929 Other chronic pain: Secondary | ICD-10-CM | POA: Diagnosis present

## 2017-10-18 DIAGNOSIS — R918 Other nonspecific abnormal finding of lung field: Secondary | ICD-10-CM | POA: Diagnosis present

## 2017-10-18 DIAGNOSIS — Z808 Family history of malignant neoplasm of other organs or systems: Secondary | ICD-10-CM | POA: Diagnosis not present

## 2017-10-18 DIAGNOSIS — Z791 Long term (current) use of non-steroidal anti-inflammatories (NSAID): Secondary | ICD-10-CM

## 2017-10-18 DIAGNOSIS — Z79899 Other long term (current) drug therapy: Secondary | ICD-10-CM | POA: Diagnosis not present

## 2017-10-18 DIAGNOSIS — Z886 Allergy status to analgesic agent status: Secondary | ICD-10-CM | POA: Diagnosis not present

## 2017-10-18 DIAGNOSIS — J95811 Postprocedural pneumothorax: Secondary | ICD-10-CM | POA: Diagnosis not present

## 2017-10-18 DIAGNOSIS — I10 Essential (primary) hypertension: Secondary | ICD-10-CM | POA: Diagnosis not present

## 2017-10-18 DIAGNOSIS — K449 Diaphragmatic hernia without obstruction or gangrene: Secondary | ICD-10-CM | POA: Diagnosis present

## 2017-10-18 DIAGNOSIS — M349 Systemic sclerosis, unspecified: Secondary | ICD-10-CM | POA: Diagnosis present

## 2017-10-18 DIAGNOSIS — M549 Dorsalgia, unspecified: Secondary | ICD-10-CM

## 2017-10-18 DIAGNOSIS — E785 Hyperlipidemia, unspecified: Secondary | ICD-10-CM | POA: Diagnosis present

## 2017-10-18 DIAGNOSIS — Z79891 Long term (current) use of opiate analgesic: Secondary | ICD-10-CM

## 2017-10-18 DIAGNOSIS — Y838 Other surgical procedures as the cause of abnormal reaction of the patient, or of later complication, without mention of misadventure at the time of the procedure: Secondary | ICD-10-CM | POA: Diagnosis present

## 2017-10-18 DIAGNOSIS — J180 Bronchopneumonia, unspecified organism: Secondary | ICD-10-CM | POA: Diagnosis not present

## 2017-10-18 DIAGNOSIS — F17210 Nicotine dependence, cigarettes, uncomplicated: Secondary | ICD-10-CM | POA: Diagnosis present

## 2017-10-18 DIAGNOSIS — J449 Chronic obstructive pulmonary disease, unspecified: Secondary | ICD-10-CM | POA: Diagnosis not present

## 2017-10-18 DIAGNOSIS — E039 Hypothyroidism, unspecified: Secondary | ICD-10-CM | POA: Diagnosis present

## 2017-10-18 DIAGNOSIS — K219 Gastro-esophageal reflux disease without esophagitis: Secondary | ICD-10-CM | POA: Diagnosis present

## 2017-10-18 DIAGNOSIS — Z9049 Acquired absence of other specified parts of digestive tract: Secondary | ICD-10-CM

## 2017-10-18 DIAGNOSIS — F329 Major depressive disorder, single episode, unspecified: Secondary | ICD-10-CM | POA: Diagnosis not present

## 2017-10-18 DIAGNOSIS — Z8249 Family history of ischemic heart disease and other diseases of the circulatory system: Secondary | ICD-10-CM | POA: Diagnosis not present

## 2017-10-18 DIAGNOSIS — Z4682 Encounter for fitting and adjustment of non-vascular catheter: Secondary | ICD-10-CM | POA: Diagnosis not present

## 2017-10-18 DIAGNOSIS — Z419 Encounter for procedure for purposes other than remedying health state, unspecified: Secondary | ICD-10-CM

## 2017-10-18 DIAGNOSIS — Z9889 Other specified postprocedural states: Secondary | ICD-10-CM

## 2017-10-18 DIAGNOSIS — R846 Abnormal cytological findings in specimens from respiratory organs and thorax: Secondary | ICD-10-CM | POA: Diagnosis not present

## 2017-10-18 DIAGNOSIS — Z823 Family history of stroke: Secondary | ICD-10-CM

## 2017-10-18 DIAGNOSIS — C3492 Malignant neoplasm of unspecified part of left bronchus or lung: Secondary | ICD-10-CM | POA: Diagnosis present

## 2017-10-18 HISTORY — DX: Essential (primary) hypertension: I10

## 2017-10-18 HISTORY — PX: VIDEO BRONCHOSCOPY WITH ENDOBRONCHIAL NAVIGATION: SHX6175

## 2017-10-18 HISTORY — PX: FUDUCIAL PLACEMENT: SHX5083

## 2017-10-18 HISTORY — PX: VIDEO BRONCHOSCOPY: SHX5072

## 2017-10-18 HISTORY — DX: Dyspnea, unspecified: R06.00

## 2017-10-18 LAB — BASIC METABOLIC PANEL
ANION GAP: 14 (ref 5–15)
BUN: 8 mg/dL (ref 8–23)
CHLORIDE: 99 mmol/L (ref 98–111)
CO2: 25 mmol/L (ref 22–32)
Calcium: 7.5 mg/dL — ABNORMAL LOW (ref 8.9–10.3)
Creatinine, Ser: 1.12 mg/dL — ABNORMAL HIGH (ref 0.44–1.00)
GFR calc non Af Amer: 52 mL/min — ABNORMAL LOW (ref >60)
GFR, EST AFRICAN AMERICAN: 60 mL/min — AB (ref >60)
Glucose, Bld: 81 mg/dL (ref 70–99)
Potassium: 2.6 mmol/L — CL (ref 3.5–5.1)
Sodium: 138 mmol/L (ref 135–145)

## 2017-10-18 LAB — APTT: aPTT: 32 seconds (ref 24–36)

## 2017-10-18 LAB — CBC
HCT: 36.9 % (ref 36.0–46.0)
HEMOGLOBIN: 11.8 g/dL — AB (ref 12.0–15.0)
MCH: 26.7 pg (ref 26.0–34.0)
MCHC: 32 g/dL (ref 30.0–36.0)
MCV: 83.5 fL (ref 78.0–100.0)
Platelets: 322 10*3/uL (ref 150–400)
RBC: 4.42 MIL/uL (ref 3.87–5.11)
RDW: 15.4 % (ref 11.5–15.5)
WBC: 12.5 10*3/uL — ABNORMAL HIGH (ref 4.0–10.5)

## 2017-10-18 LAB — PROTIME-INR
INR: 1
Prothrombin Time: 13.1 seconds (ref 11.4–15.2)

## 2017-10-18 SURGERY — VIDEO BRONCHOSCOPY WITH ENDOBRONCHIAL NAVIGATION
Anesthesia: General

## 2017-10-18 MED ORDER — DEXAMETHASONE SODIUM PHOSPHATE 10 MG/ML IJ SOLN
INTRAMUSCULAR | Status: DC | PRN
  Administered 2017-10-18: 10 mg via INTRAVENOUS

## 2017-10-18 MED ORDER — PHENYLEPHRINE 40 MCG/ML (10ML) SYRINGE FOR IV PUSH (FOR BLOOD PRESSURE SUPPORT)
PREFILLED_SYRINGE | INTRAVENOUS | Status: DC | PRN
  Administered 2017-10-18 (×2): 80 ug via INTRAVENOUS

## 2017-10-18 MED ORDER — PROPOFOL 10 MG/ML IV BOLUS
INTRAVENOUS | Status: DC | PRN
  Administered 2017-10-18: 130 mg via INTRAVENOUS

## 2017-10-18 MED ORDER — LIDOCAINE 2% (20 MG/ML) 5 ML SYRINGE
INTRAMUSCULAR | Status: AC
  Filled 2017-10-18: qty 5

## 2017-10-18 MED ORDER — VITAMIN D 1000 UNITS PO TABS
1000.0000 [IU] | ORAL_TABLET | Freq: Every day | ORAL | Status: DC
  Administered 2017-10-18 – 2017-10-19 (×2): 1000 [IU] via ORAL
  Filled 2017-10-18 (×2): qty 1

## 2017-10-18 MED ORDER — ONDANSETRON HCL 4 MG/2ML IJ SOLN: 4.0000 mg | Freq: Once | INTRAMUSCULAR | Status: DC | PRN

## 2017-10-18 MED ORDER — AMLODIPINE BESYLATE 5 MG PO TABS
5.0000 mg | ORAL_TABLET | Freq: Every day | ORAL | Status: DC
  Administered 2017-10-19: 5 mg via ORAL
  Filled 2017-10-18: qty 1

## 2017-10-18 MED ORDER — LORAZEPAM 2 MG/ML IJ SOLN
1.0000 mg | Freq: Once | INTRAMUSCULAR | Status: AC
  Administered 2017-10-18: 1 mg via INTRAVENOUS
  Filled 2017-10-18: qty 1

## 2017-10-18 MED ORDER — DEXAMETHASONE SODIUM PHOSPHATE 10 MG/ML IJ SOLN
INTRAMUSCULAR | Status: AC
  Filled 2017-10-18: qty 2

## 2017-10-18 MED ORDER — LIDOCAINE HCL (CARDIAC) PF 100 MG/5ML IV SOSY
PREFILLED_SYRINGE | INTRAVENOUS | Status: DC | PRN
  Administered 2017-10-18: 80 mg via INTRAVENOUS

## 2017-10-18 MED ORDER — MIDAZOLAM HCL 5 MG/5ML IJ SOLN
INTRAMUSCULAR | Status: DC | PRN
  Administered 2017-10-18: 2 mg via INTRAVENOUS

## 2017-10-18 MED ORDER — ALBUTEROL SULFATE HFA 108 (90 BASE) MCG/ACT IN AERS: 2.0000 | INHALATION_SPRAY | Freq: Four times a day (QID) | RESPIRATORY_TRACT | Status: DC | PRN

## 2017-10-18 MED ORDER — ACETAMINOPHEN 325 MG PO TABS
650.0000 mg | ORAL_TABLET | Freq: Three times a day (TID) | ORAL | Status: DC | PRN
  Administered 2017-10-18: 650 mg via ORAL
  Filled 2017-10-18: qty 2

## 2017-10-18 MED ORDER — POTASSIUM CHLORIDE ER 10 MEQ PO TBCR
10.0000 meq | EXTENDED_RELEASE_TABLET | Freq: Every day | ORAL | Status: DC
  Administered 2017-10-18 – 2017-10-19 (×2): 10 meq via ORAL
  Filled 2017-10-18 (×4): qty 1

## 2017-10-18 MED ORDER — LEVOTHYROXINE SODIUM 75 MCG PO TABS
75.0000 ug | ORAL_TABLET | ORAL | Status: DC
  Administered 2017-10-19: 75 ug via ORAL
  Filled 2017-10-18: qty 1

## 2017-10-18 MED ORDER — PANTOPRAZOLE SODIUM 40 MG PO TBEC
40.0000 mg | DELAYED_RELEASE_TABLET | Freq: Two times a day (BID) | ORAL | Status: DC
  Administered 2017-10-18 – 2017-10-19 (×2): 40 mg via ORAL
  Filled 2017-10-18 (×2): qty 1

## 2017-10-18 MED ORDER — 0.9 % SODIUM CHLORIDE (POUR BTL) OPTIME
TOPICAL | Status: DC | PRN
  Administered 2017-10-18: 1000 mL

## 2017-10-18 MED ORDER — ROCURONIUM BROMIDE 50 MG/5ML IV SOSY
PREFILLED_SYRINGE | INTRAVENOUS | Status: AC
  Filled 2017-10-18: qty 15

## 2017-10-18 MED ORDER — LEVOTHYROXINE SODIUM 75 MCG PO TABS: 37.5000 ug | ORAL_TABLET | ORAL | Status: DC

## 2017-10-18 MED ORDER — MELOXICAM 7.5 MG PO TABS
7.5000 mg | ORAL_TABLET | Freq: Every day | ORAL | Status: DC
  Administered 2017-10-18 – 2017-10-19 (×2): 7.5 mg via ORAL
  Filled 2017-10-18 (×2): qty 1

## 2017-10-18 MED ORDER — FENTANYL CITRATE (PF) 100 MCG/2ML IJ SOLN
INTRAMUSCULAR | Status: DC | PRN
  Administered 2017-10-18 (×3): 50 ug via INTRAVENOUS

## 2017-10-18 MED ORDER — MONTELUKAST SODIUM 10 MG PO TABS
10.0000 mg | ORAL_TABLET | Freq: Every day | ORAL | Status: DC
  Administered 2017-10-18: 10 mg via ORAL
  Filled 2017-10-18: qty 1

## 2017-10-18 MED ORDER — DULOXETINE HCL 60 MG PO CPEP
60.0000 mg | ORAL_CAPSULE | Freq: Every day | ORAL | Status: DC
  Administered 2017-10-19: 60 mg via ORAL
  Filled 2017-10-18: qty 1

## 2017-10-18 MED ORDER — SODIUM CHLORIDE 0.9 % IV SOLN
INTRAVENOUS | Status: DC | PRN
  Administered 2017-10-18: 20 ug/min via INTRAVENOUS

## 2017-10-18 MED ORDER — GABAPENTIN 600 MG PO TABS: 600.0000 mg | ORAL_TABLET | Freq: Two times a day (BID) | ORAL | Status: DC

## 2017-10-18 MED ORDER — LOPERAMIDE HCL 2 MG PO CAPS: 2.0000 mg | ORAL_CAPSULE | ORAL | Status: DC | PRN

## 2017-10-18 MED ORDER — ROCURONIUM BROMIDE 10 MG/ML (PF) SYRINGE
PREFILLED_SYRINGE | INTRAVENOUS | Status: DC | PRN
  Administered 2017-10-18 (×2): 10 mg via INTRAVENOUS
  Administered 2017-10-18: 40 mg via INTRAVENOUS
  Administered 2017-10-18: 10 mg via INTRAVENOUS

## 2017-10-18 MED ORDER — SUGAMMADEX SODIUM 200 MG/2ML IV SOLN
INTRAVENOUS | Status: DC | PRN
  Administered 2017-10-18: 200 mg via INTRAVENOUS

## 2017-10-18 MED ORDER — ROCURONIUM BROMIDE 50 MG/5ML IV SOSY
PREFILLED_SYRINGE | INTRAVENOUS | Status: AC
  Filled 2017-10-18: qty 5

## 2017-10-18 MED ORDER — GLYCOPYRROLATE PF 0.2 MG/ML IJ SOSY
PREFILLED_SYRINGE | INTRAMUSCULAR | Status: AC
  Filled 2017-10-18: qty 3

## 2017-10-18 MED ORDER — LIDOCAINE 5 % EX PTCH: 1.0000 | MEDICATED_PATCH | Freq: Every day | CUTANEOUS | Status: DC | PRN

## 2017-10-18 MED ORDER — FENTANYL CITRATE (PF) 250 MCG/5ML IJ SOLN
INTRAMUSCULAR | Status: AC
  Filled 2017-10-18: qty 5

## 2017-10-18 MED ORDER — PROPOFOL 10 MG/ML IV BOLUS
INTRAVENOUS | Status: AC
  Filled 2017-10-18: qty 20

## 2017-10-18 MED ORDER — PHENYLEPHRINE 40 MCG/ML (10ML) SYRINGE FOR IV PUSH (FOR BLOOD PRESSURE SUPPORT)
PREFILLED_SYRINGE | INTRAVENOUS | Status: AC
  Filled 2017-10-18: qty 20

## 2017-10-18 MED ORDER — NICOTINE 21 MG/24HR TD PT24
21.0000 mg | MEDICATED_PATCH | Freq: Every day | TRANSDERMAL | Status: DC
  Administered 2017-10-18 – 2017-10-19 (×2): 21 mg via TRANSDERMAL
  Filled 2017-10-18 (×2): qty 1

## 2017-10-18 MED ORDER — LORAZEPAM 2 MG/ML IJ SOLN: 2.0000 mg | Freq: Once | INTRAMUSCULAR | Status: DC

## 2017-10-18 MED ORDER — GABAPENTIN 600 MG PO TABS
600.0000 mg | ORAL_TABLET | Freq: Two times a day (BID) | ORAL | Status: DC
  Administered 2017-10-18 – 2017-10-19 (×2): 600 mg via ORAL
  Filled 2017-10-18 (×2): qty 1

## 2017-10-18 MED ORDER — TRAMADOL HCL 50 MG PO TABS
50.0000 mg | ORAL_TABLET | Freq: Two times a day (BID) | ORAL | Status: DC
  Administered 2017-10-18 – 2017-10-19 (×3): 50 mg via ORAL
  Filled 2017-10-18 (×3): qty 1

## 2017-10-18 MED ORDER — ONDANSETRON HCL 4 MG/2ML IJ SOLN
INTRAMUSCULAR | Status: DC | PRN
  Administered 2017-10-18: 4 mg via INTRAVENOUS

## 2017-10-18 MED ORDER — SODIUM CHLORIDE 0.9% FLUSH: 3.0000 mL | Freq: Two times a day (BID) | INTRAVENOUS | Status: DC

## 2017-10-18 MED ORDER — FENTANYL CITRATE (PF) 100 MCG/2ML IJ SOLN
INTRAMUSCULAR | Status: AC
  Filled 2017-10-18: qty 2

## 2017-10-18 MED ORDER — SODIUM CHLORIDE 0.9 % IV SOLN
250.0000 mL | INTRAVENOUS | Status: DC | PRN
  Administered 2017-10-18 – 2017-10-19 (×2): 250 mL via INTRAVENOUS

## 2017-10-18 MED ORDER — MIDAZOLAM HCL 2 MG/2ML IJ SOLN
INTRAMUSCULAR | Status: AC
  Filled 2017-10-18: qty 2

## 2017-10-18 MED ORDER — BUSPIRONE HCL 10 MG PO TABS
10.0000 mg | ORAL_TABLET | Freq: Three times a day (TID) | ORAL | Status: DC
  Administered 2017-10-18 – 2017-10-19 (×3): 10 mg via ORAL
  Filled 2017-10-18 (×5): qty 1

## 2017-10-18 MED ORDER — PRAVASTATIN SODIUM 40 MG PO TABS
40.0000 mg | ORAL_TABLET | Freq: Every day | ORAL | Status: DC
  Administered 2017-10-18 – 2017-10-19 (×2): 40 mg via ORAL
  Filled 2017-10-18 (×2): qty 1

## 2017-10-18 MED ORDER — LACTATED RINGERS IV SOLN
INTRAVENOUS | Status: DC | PRN
  Administered 2017-10-18: 08:00:00 via INTRAVENOUS

## 2017-10-18 MED ORDER — OXYCODONE HCL 5 MG PO TABS
5.0000 mg | ORAL_TABLET | ORAL | Status: DC | PRN
  Administered 2017-10-18 – 2017-10-19 (×3): 5 mg via ORAL
  Filled 2017-10-18 (×4): qty 1

## 2017-10-18 MED ORDER — SODIUM CHLORIDE 0.9% FLUSH: 3.0000 mL | INTRAVENOUS | Status: DC | PRN

## 2017-10-18 MED ORDER — ONDANSETRON HCL 4 MG/2ML IJ SOLN
INTRAMUSCULAR | Status: AC
  Filled 2017-10-18: qty 2

## 2017-10-18 MED ORDER — FENTANYL CITRATE (PF) 100 MCG/2ML IJ SOLN
25.0000 ug | INTRAMUSCULAR | Status: DC | PRN
  Administered 2017-10-18 (×2): 25 ug via INTRAVENOUS

## 2017-10-18 SURGICAL SUPPLY — 44 items
ADAPTER BRONCHOSCOPE OLYMPUS (ADAPTER) ×8 IMPLANT
ADAPTER VALVE BIOPSY EBUS (MISCELLANEOUS) IMPLANT
ADPTR VALVE BIOPSY EBUS (MISCELLANEOUS)
BRUSH CYTOL CELLEBRITY 1.5X140 (MISCELLANEOUS) ×4 IMPLANT
BRUSH SUPERTRAX BIOPSY (INSTRUMENTS) IMPLANT
BRUSH SUPERTRAX NDL-TIP CYTO (INSTRUMENTS) ×8 IMPLANT
CANISTER SUCT 3000ML PPV (MISCELLANEOUS) ×4 IMPLANT
CHANNEL WORK EXTEND EDGE 180 (KITS) IMPLANT
CHANNEL WORK EXTEND EDGE 90 (KITS) IMPLANT
CONT SPEC 4OZ CLIKSEAL STRL BL (MISCELLANEOUS) ×4 IMPLANT
COVER BACK TABLE 60X90IN (DRAPES) ×4 IMPLANT
COVER WAND RF STERILE (DRAPES) ×4 IMPLANT
FILTER STRAW FLUID ASPIR (MISCELLANEOUS) IMPLANT
FORCEPS BIOP SUPERTRX PREMAR (INSTRUMENTS) ×8 IMPLANT
GAUZE SPONGE 4X4 12PLY STRL (GAUZE/BANDAGES/DRESSINGS) ×4 IMPLANT
GLOVE BIO SURGEON STRL SZ7.5 (GLOVE) ×8 IMPLANT
GOWN STRL REUS W/ TWL LRG LVL3 (GOWN DISPOSABLE) ×4 IMPLANT
GOWN STRL REUS W/TWL LRG LVL3 (GOWN DISPOSABLE) ×6
KIT CLEAN ENDO COMPLIANCE (KITS) ×4 IMPLANT
KIT ENDOBRONCHIAL EDGE FIRM (KITS) ×4 IMPLANT
KIT LOCATABLE GUIDE (CANNULA) IMPLANT
KIT MARKER FIDUCIAL DELIVERY (KITS) ×4 IMPLANT
KIT PROCEDURE EDGE 180 (KITS) IMPLANT
KIT PROCEDURE EDGE 90 (KITS) IMPLANT
KIT TURNOVER KIT B (KITS) ×4 IMPLANT
MARKER FIDUCIAL SL NIT COIL (Implant Marker) ×12 IMPLANT
MARKER SKIN DUAL TIP RULER LAB (MISCELLANEOUS) ×4 IMPLANT
NEEDLE SUPERTRX PREMARK BIOPSY (NEEDLE) ×8 IMPLANT
NS IRRIG 1000ML POUR BTL (IV SOLUTION) ×4 IMPLANT
OIL SILICONE PENTAX (PARTS (SERVICE/REPAIRS)) ×4 IMPLANT
PAD ARMBOARD 7.5X6 YLW CONV (MISCELLANEOUS) ×8 IMPLANT
PATCHES PATIENT (LABEL) ×12 IMPLANT
SYR 20CC LL (SYRINGE) ×4 IMPLANT
SYR 20ML ECCENTRIC (SYRINGE) ×4 IMPLANT
SYR 50ML SLIP (SYRINGE) ×4 IMPLANT
TOWEL OR 17X24 6PK STRL BLUE (TOWEL DISPOSABLE) ×4 IMPLANT
TRAP SPECIMEN MUCOUS 40CC (MISCELLANEOUS) IMPLANT
TUBE CONNECTING 20'X1/4 (TUBING) ×1
TUBE CONNECTING 20X1/4 (TUBING) ×3 IMPLANT
UNDERPAD 30X30 (UNDERPADS AND DIAPERS) ×4 IMPLANT
VALVE BIOPSY  SINGLE USE (MISCELLANEOUS) ×6
VALVE BIOPSY SINGLE USE (MISCELLANEOUS) ×6 IMPLANT
VALVE SUCTION BRONCHIO DISP (MISCELLANEOUS) ×4 IMPLANT
WATER STERILE IRR 1000ML POUR (IV SOLUTION) ×4 IMPLANT

## 2017-10-18 NOTE — Interval H&P Note (Signed)
PCCM Interval Note  Pt presents today for further eval of multiple pulmonary nodules, LLL nodule with some hypermetabolism. She is doing well, no new issues since last visit. She has requested that we not discuss her results with family, only with her.   CBC    Component Value Date/Time   WBC 12.5 (H) 10/18/2017 0708   RBC 4.42 10/18/2017 0708   HGB 11.8 (L) 10/18/2017 0708   HCT 36.9 10/18/2017 0708   PLT 322 10/18/2017 0708   MCV 83.5 10/18/2017 0708   MCH 26.7 10/18/2017 0708   MCHC 32.0 10/18/2017 0708   RDW 15.4 10/18/2017 0708   LYMPHSABS 2.4 12/15/2013 1336   MONOABS 0.7 12/15/2013 1336   EOSABS 0.1 12/15/2013 1336   BASOSABS 0.0 12/15/2013 1336   INR >> 1.00  We are planning for navigational bronchoscopy with TBBx's. She understands the procedure including the risks and benefits. All questions answered. She elects to proceed. No barriers noted.   Baltazar Apo, MD, PhD 10/18/2017, 8:12 AM [facility] [phone] or if no answer [phone]

## 2017-10-18 NOTE — Transfer of Care (Signed)
Immediate Anesthesia Transfer of Care Note  Patient: [name]  Procedure(s) Performed: VIDEO BRONCHOSCOPY WITH ENDOBRONCHIAL NAVIGATION (N/A ) PLACEMENT OF 3 FUDUCIAL INTO LEFT LOWER LOBE OF LUNG-TARGET 1  Patient Location: PACU  Anesthesia Type:General  Level of Consciousness: awake, alert , oriented and patient cooperative  Airway & Oxygen Therapy: Patient Spontanous Breathing and Patient connected to nasal cannula oxygen  Post-op Assessment: Report given to RN, Post -op Vital signs reviewed and stable and Patient moving all extremities X 4  Post vital signs: Reviewed and stable  Last Vitals:  Vitals Value Taken Time  BP 110/73 10/18/2017 10:21 AM  Temp 36.6 C 10/18/2017 10:21 AM  Pulse 93 10/18/2017 10:23 AM  Resp 23 10/18/2017 10:23 AM  SpO2 100 % 10/18/2017 10:23 AM  Vitals shown include unvalidated device data.  Last Pain:  Vitals:   10/18/17 0710  TempSrc: Oral  PainSc:       Patients Stated Pain Goal: 3 (50/41/36 4383)  Complications: No apparent anesthesia complications

## 2017-10-18 NOTE — Anesthesia Procedure Notes (Addendum)
Procedure Name: Intubation Date/Time: 10/18/2017 8:37 AM Performed by: Carney Living, CRNA Pre-anesthesia Checklist: Patient identified, Emergency Drugs available, Suction available and Patient being monitored Patient Re-evaluated:Patient Re-evaluated prior to induction Oxygen Delivery Method: Circle System Utilized Preoxygenation: Pre-oxygenation with 100% oxygen Induction Type: IV induction Ventilation: Oral airway inserted - appropriate to patient size Laryngoscope Size: Mac and 3 Grade View: Grade I Tube type: Oral Tube size: 8.0 mm Number of attempts: 1 Airway Equipment and Method: Stylet and Oral airway Placement Confirmation: ETT inserted through vocal cords under direct vision,  positive ETCO2 and breath sounds checked- equal and bilateral Secured at: 23 cm Tube secured with: Tape Dental Injury: Teeth and Oropharynx as per pre-operative assessment  Comments: Intubation performed by Newman Nickels, SRNA

## 2017-10-18 NOTE — Discharge Instructions (Signed)
Flexible Bronchoscopy, Care After These instructions give you information on caring for yourself after your procedure. Your doctor may also give you more specific instructions. Call your doctor if you have any problems or questions after your procedure. Follow these instructions at home:  Do not eat or drink anything for 2 hours after your procedure. If you try to eat or drink before the medicine wears off, food or drink could go into your lungs. You could also burn yourself.  After 2 hours have passed and when you can cough and gag normally, you may eat soft food and drink liquids slowly.  The day after the test, you may eat your normal diet.  You may do your normal activities.  Keep all doctor visits. Get help right away if:  You get more and more short of breath.  You get light-headed.  You feel like you are going to pass out (faint).  You have chest pain.  You have new problems that worry you.  You cough up more than a little blood.  You cough up more blood than before.  Please call our office for any concerns or questions.  [phone].   This information is not intended to replace advice given to you by your health care provider. Make sure you discuss any questions you have with your health care provider. Document Released: 10/31/2008 Document Revised: 06/11/2015 Document Reviewed: 09/07/2012 Elsevier Interactive Patient Education  2017 [employer].

## 2017-10-18 NOTE — H&P (Signed)
PULMONARY / CRITICAL CARE MEDICINE   NAME:  Kelly Douglas, MRN:  [id number], DOB:  1955-09-03, LOS: 0 ADMISSION DATE:  10/18/2017, CONSULTATION DATE:  10/2 CHIEF COMPLAINT:  Pneumothorax  BRIEF HISTORY:    62 year old woman with a history of scleroderma, left lower lobe nodule.  Admitted with pneumothorax following navigational bronchoscopy and transbronchial biopsies.  HISTORY OF PRESENT ILLNESS   62 year old woman with a history of tobacco use, allergic rhinitis, scleroderma, hypothyroidism, hypertension.  She has been found to have a slowly enlarging left lower lobe rounded nodule that has mild hypermetabolism on PET scan.  She also has a non-hypermetabolic left upper lobe opacity that is presumed to be scar, has been stable over time.  She underwent navigational bronchoscopy for tissue diagnosis of her left lower lobe nodule on 10/2.  Post procedure she had some chest discomfort and a chest x-ray revealed a 10% pneumothorax.  She is admitted for observation, repeat chest x-ray, possible tube thoracostomy if indicated.  SIGNIFICANT PAST MEDICAL HISTORY    has a past medical history of Anxiety, Arteriosclerotic cardiovascular disease (ASCVD) (2013), Arthritis, Chronic back pain, Chronic bronchitis, Chronic lung disease, Depression, Dyspnea, GERD (gastroesophageal reflux disease), Hyperlipidemia, Hypertension, Hypothyroid (1981 approx), Scleroderma ([place]), Seasonal allergies, Syncope, and Tobacco abuse (06/24/2009).   SIGNIFICANT EVENTS:  FOB 10/2  STUDIES:   TBBx 10/2 >>  Brushings 10/2 >>  BAL cytology 10/2  CULTURES:  BAL 10/2 >>  AFB 10/2 >>  Fungal 10/2 >>   ANTIBIOTICS:  None  LINES/TUBES:   CONSULTANTS:   SUBJECTIVE:  Patient is having some chest discomfort post procedure, no dyspnea  CONSTITUTIONAL: BP 128/78 (BP Location: Right Arm)   Pulse 100   Temp 97.9 F (36.6 C)   Resp 17   Ht _0  (1.727 m)   Wt 69.4 kg   SpO2 99%   BMI 23.26 kg/m   No intake/output  data recorded.     PHYSICAL EXAM: General: Chronically ill but in no distress Neuro: Awake, alert, nonfocal HEENT: Pharynx clear, pupils equal, no lesions Cardiovascular: Regular, no murmur Lungs: Few scattered inspiratory crackles bilaterally, good breath sounds bilaterally Abdomen: Soft, benign, positive bowel sounds Musculoskeletal: No deformity Skin: There is of hyperpigmentation and scleroderma changes  RESOLVED PROBLEM LIST   ASSESSMENT AND PLAN   Iatrogenic pneumothorax following bronchoscopy and trans-bronchial biopsies -We will follow serial chest x-rays, clinical exam.  If her pneumothorax is increasing then we will perform tube thoracostomy.  Left lower lobe rounded pulmonary nodule -Await biopsy, brushing, washing results  Hypothyroidism -Continue home Synthroid dosing  GERD -Continue home Protonix  Hypertension -Continue home Norvasc,  Depression Continue BuSpar, Cymbalta  SUMMARY OF TODAY'S PLAN:    Best Practice / Goals of Care / Disposition.   DVT PROPHYLAXIS: SCD SUP: Pantoprazole NUTRITION: Regular diet MOBILITY: Up with assist GOALS OF CARE: Full code FAMILY DISCUSSIONS: Discussed with patient's son and cousin in the PACU DISPOSITION admit to med-surg   LABS  Glucose No results for input(s): GLUCAP in the last 168 hours.  BMET Recent Labs  Lab 10/18/17 0708  NA 138  K 2.6*  CL 99  CO2 25  BUN 8  CREATININE 1.12*  GLUCOSE 81    Liver Enzymes No results for input(s): AST, ALT, ALKPHOS, BILITOT, ALBUMIN in the last 168 hours.  Electrolytes Recent Labs  Lab 10/18/17 0708  CALCIUM 7.5*    CBC Recent Labs  Lab 10/18/17 0708  WBC 12.5*  HGB 11.8*  HCT 36.9  PLT 322  ABG No results for input(s): PHART, PCO2ART, PO2ART in the last 168 hours.  Coag's Recent Labs  Lab 10/18/17 0708  APTT 32  INR 1.00    Sepsis Markers No results for input(s): LATICACIDVEN, PROCALCITON, O2SATVEN in the last 168 hours.  Cardiac  Enzymes No results for input(s): TROPONINI, PROBNP in the last 168 hours.  PAST MEDICAL HISTORY :   She  has a past medical history of Anxiety, Arteriosclerotic cardiovascular disease (ASCVD) (2013), Arthritis, Chronic back pain, Chronic bronchitis, Chronic lung disease, Depression, Dyspnea, GERD (gastroesophageal reflux disease), Hyperlipidemia, Hypertension, Hypothyroid (1981 approx), Scleroderma ([place]), Seasonal allergies, Syncope, and Tobacco abuse (06/24/2009).  PAST SURGICAL HISTORY:  She  has a past surgical history that includes Tubal ligation; Bilateral salpingoophorectomy (2013); laparoscopic appendectomy (07/16/2011); Colonoscopy (09/28/09); Esophagogastroduodenoscopy (10/07/09); Esophagogastroduodenoscopy (egd) with esophageal dilation (N/A, 03/07/2012); and Colonoscopy (N/A, 08/29/2012).  Allergies  Allergen Reactions  . Aspirin Other (See Comments)    Nose bleeds    No current facility-administered medications on file prior to encounter.    Current Outpatient Medications on File Prior to Encounter  Medication Sig  . acetaminophen (TYLENOL) 650 MG CR tablet Take 1,300 mg by mouth every 8 (eight) hours as needed for pain.  Marland Kitchen amLODipine (NORVASC) 5 MG tablet TAKE 1 TABLET EVERY DAY  . busPIRone (BUSPAR) 10 MG tablet Take 1 tablet (10 mg total) by mouth 3 (three) times daily.  . cholecalciferol (VITAMIN D) 1000 units tablet Take 1,000 Units by mouth daily.  . DULoxetine (CYMBALTA) 60 MG capsule Take 1 capsule (60 mg total) by mouth daily.  . Lidocaine-Menthol (ICY HOT LIDOCAINE PLUS MENTHOL) 4-1 % PTCH Apply 1 patch topically daily as needed (pain).  Marland Kitchen loperamide (IMODIUM A-D) 2 MG tablet Take 2-4 mg by mouth as needed for diarrhea or loose stools.  . meloxicam (MOBIC) 7.5 MG tablet Take 1 tablet (7.5 mg total) daily by mouth.  . montelukast (SINGULAIR) 10 MG tablet TAKE 1 TABLET AT BEDTIME  . Multiple Vitamins-Calcium (ONE-A-DAY WOMENS PO) Take 1 tablet by mouth daily.  . pantoprazole  (PROTONIX) 40 MG tablet TAKE 1 TABLET TWICE DAILY  . potassium chloride (K-DUR) 10 MEQ tablet Take 1 tablet (10 mEq total) by mouth daily.  . pravastatin (PRAVACHOL) 40 MG tablet TAKE 1 TABLET EVERY DAY  . traMADol (ULTRAM) 50 MG tablet Take 1 tablet (50 mg total) by mouth 2 (two) times daily.  Marland Kitchen dicyclomine (BENTYL) 10 MG capsule TAKE 1 CAPSULE THREE TIMES DAILY WITH MEALS AS NEEDED FOR  SPASMS (Patient not taking: Reported on 10/12/2017)    FAMILY HISTORY:   Her family history includes Aneurysm in her father and mother; Coronary artery disease in her brother; Diabetes in her brother and brother; Down syndrome in her brother; Hypertension in her brother, brother, brother, and sister; Stroke in her father; Throat cancer (age of onset: 68) in her brother. There is no history of Anesthesia problems, Hypotension, Malignant hyperthermia, Pseudochol deficiency, or Colon cancer.  SOCIAL HISTORY:  She  reports that she has been smoking cigarettes. She started smoking about 46 years ago. She has a 22.50 pack-year smoking history. She has never used smokeless tobacco. She reports that she does not drink alcohol or use drugs.  REVIEW OF SYSTEMS:    No SOB, she does have some pleuritic L CP  Baltazar Apo, MD, PhD 10/18/2017, 12:18 PM [facility] [phone] or if no answer [phone]

## 2017-10-18 NOTE — Progress Notes (Signed)
Patient having left sided sharp chest pain that does not radiate elsewhere. Notified Dr. Gifford Shave, ordered to give pain meds and came by to see patient. Dr. Lamonte Sakai at bedside, notifying patient that chest xray showed a small pneumothorax so she will be staying the night. Will continue to monitor patient.

## 2017-10-18 NOTE — Op Note (Signed)
Video Bronchoscopy with Electromagnetic Navigation Procedure Note  Date of Operation: 10/18/2017  Pre-op Diagnosis: LLL and LUL nodules  Post-op Diagnosis: same  Surgeon: Baltazar Apo  Assistants: none  Anesthesia: General endotracheal anesthesia  Operation: Flexible video fiberoptic bronchoscopy with electromagnetic navigation and biopsies.  Estimated Blood Loss: 20 cc  Complications: none apparent  Indications and History: Kelly Douglas is a 62 y.o. female with hx scleroderma. She was found to have a slowly enlarging left lower lobe nodule with mild hypermetabolism on PET scan.  She also has an area of left upper lobe scarring.  Recommendation was made to achieve a tissue diagnosis via navigational bronchoscopy.  The risks, benefits, complications, treatment options and expected outcomes were discussed with the patient.  The possibilities of pneumothorax, pneumonia, reaction to medication, pulmonary aspiration, perforation of a viscus, bleeding, failure to diagnose a condition and creating a complication requiring transfusion or operation were discussed with the patient who freely signed the consent.    Description of Procedure: The patient was seen in the Preoperative Area, was examined and was deemed appropriate to proceed.  The patient was taken to OR 10, identified as Kelly Douglas and the procedure verified as Flexible Video Fiberoptic Bronchoscopy.  A Time Out was held and the above information confirmed.   Prior to the date of the procedure a high-resolution CT scan of the chest was performed. Utilizing [place] a virtual tracheobronchial tree was generated to allow the creation of distinct navigation pathways to the patient's parenchymal abnormalities. After being taken to the operating room general anesthesia was initiated and the patient  was orally intubated. The video fiberoptic bronchoscope was introduced via the endotracheal tube and a general inspection  was performed which showed normal airways throughout.  There were no endobronchial lesions or abnormal secretions seen. The extendable working channel and locator guide were introduced into the bronchoscope. The distinct navigation pathways prepared prior to this procedure were then utilized to navigate to within 0.6cm of patient's lesions identified on CT scan. The extendable working channel was secured into place and the locator guide was withdrawn. Under fluoroscopic guidance transbronchial needle brushings, transbronchial Wang needle biopsies, and transbronchial forceps biopsies were performed at the LLL nodule to be sent for cytology and pathology. Then transbronchial brushings were performed after navigation to the LUL scar / nodule. Bronchioalveolar lavage was performed in the LLL and the LUL adjacent to the nodules and sent for cytology and microbiology (bacterial, fungal, AFB smears and cultures).  Finally 3 fiducial markers were placed triangulating the left lower lobe pulmonary nodule to identify it in the event that she needs radiation therapy.  At the end of the procedure a general airway inspection was performed and there was no evidence of significant bleeding. The bronchoscope was removed.  The patient tolerated the procedure well. There was no significant blood loss and there were no obvious complications. A post-procedural chest x-ray is pending.  Samples: 1. Transbronchial needle brushings from LLL and LUL nodules 2. Transbronchial Wang needle biopsies from LLL nodule 3. Transbronchial forceps biopsies from LLL nodule 4. Bronchoalveolar lavage from LLL and LLL   Plans:  The patient will be discharged from the PACU to home when recovered from anesthesia and after chest x-ray is reviewed. We will review the cytology, pathology and microbiology results with the patient when they become available. Outpatient followup will be with Dr Lamonte Sakai.    Baltazar Apo, MD, PhD 10/18/2017, 10:13  AM [facility] [phone] or  if no answer [phone]

## 2017-10-18 NOTE — Progress Notes (Signed)
[facility] Progress Note Patient Name: Kelly Douglas DOB: 10-31-1955 MRN: [id number]   Date of Service  10/18/2017  HPI/Events of Note  Tobacco abuse - Patient smokes about 1.5 PPD and requests a Nicotine patch.   eICU Interventions  Will order: 1. Nicotine patch 21 mg to skin Q day.      Intervention Category Major Interventions: Other:  Lister Brizzi Cornelia Copa 10/18/2017, 9:15 PM

## 2017-10-19 ENCOUNTER — Encounter (HOSPITAL_COMMUNITY): Payer: Self-pay | Admitting: Emergency Medicine

## 2017-10-19 ENCOUNTER — Inpatient Hospital Stay (HOSPITAL_COMMUNITY): Payer: Medicare HMO

## 2017-10-19 ENCOUNTER — Ambulatory Visit: Payer: Medicare HMO | Admitting: Gastroenterology

## 2017-10-19 DIAGNOSIS — J939 Pneumothorax, unspecified: Secondary | ICD-10-CM

## 2017-10-19 LAB — CBC
HEMATOCRIT: 33.1 % — AB (ref 36.0–46.0)
Hemoglobin: 10.8 g/dL — ABNORMAL LOW (ref 12.0–15.0)
MCH: 26.5 pg (ref 26.0–34.0)
MCHC: 32.6 g/dL (ref 30.0–36.0)
MCV: 81.1 fL (ref 78.0–100.0)
Platelets: 293 10*3/uL (ref 150–400)
RBC: 4.08 MIL/uL (ref 3.87–5.11)
RDW: 15 % (ref 11.5–15.5)
WBC: 9.3 10*3/uL (ref 4.0–10.5)

## 2017-10-19 LAB — BASIC METABOLIC PANEL
Anion gap: 11 (ref 5–15)
BUN: 5 mg/dL — AB (ref 8–23)
CALCIUM: 7.3 mg/dL — AB (ref 8.9–10.3)
CO2: 25 mmol/L (ref 22–32)
Chloride: 103 mmol/L (ref 98–111)
Creatinine, Ser: 0.87 mg/dL (ref 0.44–1.00)
GFR calc Af Amer: >60 mL/min (ref >60)
GLUCOSE: 149 mg/dL — AB (ref 70–99)
POTASSIUM: 3.2 mmol/L — AB (ref 3.5–5.1)
Sodium: 139 mmol/L (ref 135–145)

## 2017-10-19 LAB — ACID FAST SMEAR (AFB)
ACID FAST SMEAR - AFSCU2: NEGATIVE
ACID FAST SMEAR - AFSCU2: NEGATIVE

## 2017-10-19 LAB — ACID FAST SMEAR (AFB, MYCOBACTERIA)

## 2017-10-19 NOTE — Procedures (Signed)
Consent was obtained.  With the patient lying supine the area overlying the fourth rib and the mid axillary line was sterilely prepped and draped in the usual fashion.  In overlying the fourth rib was infiltrated with 1% lidocaine.  Lidocaine was then further infiltrated down to the pleural space.  The skin was then nicked and a finder needle was inserted into the pleural space.  Confirmation was noted with the presence of air in the syringe.  A guidewire was inserted through the needle and the incision was dilated.  A Wayne pneumothorax catheter was then inserted over the guidewire using stiffening trocar.  The chest tube was then attached to a dry suction apparatus with immediate return of a large quantity of air and was set to 10 cm of suction.  The patient presents tolerated procedure well complaining only of mild chest pain related to the insertion site.  Follow-up chest x-ray showed near complete resolution of the pneumothorax with the catheter in appropriate position.  Kipp Brood, MD [facility] ICU Physician [place]  Pager: [phone] Mobile: [phone] After hours: [phone].

## 2017-10-19 NOTE — Discharge Summary (Signed)
Physician Discharge Summary  Patient ID: Kelly Douglas MRN: [id number] DOB/AGE: 62-Nov-1957 62 y.o.  Admit date: 10/18/2017 Discharge date: 10/19/2017  Admission Diagnoses: post procedural left pneumothorax.  Discharge Diagnoses:  Principal Problem:   Pulmonary nodules Active Problems:   S/P bronchoscopy   Pneumothorax after biopsy   Discharged Condition: good  Hospital Course: Patient admitted for a post bronchoscopy left pneumothorax. A left pigtail catheter was inserted under local anesthetic and mild sedation.  Post insertion chest film showed complete to near complete resolution of the pneumothorax.  There was no residual pneumothorax on the follow-up chest x-ray the next morning.  A repeat chest x-ray and lung ultrasound after 4 hours of waterseal showed no further air leak or pneumothorax.  The pigtail catheter was withdrawn and the patient was discharged home with follow-up in 1 week's time with Dr. West Carbo  Consults: None  Significant Diagnostic Studies: radiology: CXR: Initially showed 50% pneumothorax.  Current post chest tube films showed resolution of the pneumothorax.  Treatments: Chest tube insertion  Discharge Exam: Blood pressure (!) 141/95, pulse 83, temperature 97.7 F (36.5 C), temperature source Oral, resp. rate 17, height 5\' 8"  (1.727 m), weight 69.4 kg, SpO2 99 %. General appearance: alert and cooperative Resp: clear to auscultation bilaterally Cardio: regular rate and rhythm, S1, S2 normal, no murmur, click, rub or gallop GI: soft, non-tender; bowel sounds normal; no masses,  no organomegaly Neurologic: Grossly normal  Disposition: Discharge disposition: 01-Home or Self Care       Discharge Instructions    Discharge patient   Complete by:  As directed    Discharge disposition:  01-Home or Self Care   Discharge patient date:  10/18/2017     Allergies as of 10/19/2017      Reactions   Aspirin Other (See Comments)   Nose bleeds       Medication List    STOP taking these medications   dicyclomine 10 MG capsule Commonly known as:  BENTYL     TAKE these medications   acetaminophen 650 MG CR tablet Commonly known as:  TYLENOL Take 1,300 mg by mouth every 8 (eight) hours as needed for pain.   albuterol 108 (90 Base) MCG/ACT inhaler Commonly known as:  PROVENTIL HFA;VENTOLIN HFA Inhale 2 puffs into the lungs every 6 (six) hours as needed for wheezing or shortness of breath. Shortness of breath What changed:  reasons to take this   amLODipine 5 MG tablet Commonly known as:  NORVASC TAKE 1 TABLET EVERY DAY   busPIRone 10 MG tablet Commonly known as:  BUSPAR Take 1 tablet (10 mg total) by mouth 3 (three) times daily.   cholecalciferol 1000 units tablet Commonly known as:  VITAMIN D Take 1,000 Units by mouth daily.   DULoxetine 60 MG capsule Commonly known as:  CYMBALTA Take 1 capsule (60 mg total) by mouth daily.   gabapentin 600 MG tablet Commonly known as:  NEURONTIN Take 1 tablet (600 mg total) by mouth 2 (two) times daily. What changed:  See the new instructions.   ICY HOT LIDOCAINE PLUS MENTHOL 4-1 % Ptch Generic drug:  Lidocaine-Menthol Apply 1 patch topically daily as needed (pain).   levothyroxine 75 MCG tablet Commonly known as:  SYNTHROID, LEVOTHROID Take 0.5-1 tablets (37.5-75 mcg total) by mouth See admin instructions. Alternate taking 75 mcg and 37.5 mcg once daily   loperamide 2 MG tablet Commonly known as:  IMODIUM A-D Take 2-4 mg by mouth as needed for diarrhea or loose stools.  meloxicam 7.5 MG tablet Commonly known as:  MOBIC Take 1 tablet (7.5 mg total) daily by mouth.   montelukast 10 MG tablet Commonly known as:  SINGULAIR TAKE 1 TABLET AT BEDTIME   ONE-A-DAY WOMENS PO Take 1 tablet by mouth daily.   pantoprazole 40 MG tablet Commonly known as:  PROTONIX TAKE 1 TABLET TWICE DAILY   potassium chloride 10 MEQ tablet Commonly known as:  K-DUR Take 1 tablet (10 mEq  total) by mouth daily.   pravastatin 40 MG tablet Commonly known as:  PRAVACHOL TAKE 1 TABLET EVERY DAY   traMADol 50 MG tablet Commonly known as:  ULTRAM Take 1 tablet (50 mg total) by mouth 2 (two) times daily.      Follow-up Information    Collene Gobble, MD Follow up in 1 week(s).   Specialty:  Pulmonary Disease Contact information: [address] [place] [address] [phone]        [name], MD. Schedule an appointment as soon as possible for a visit in 1 month(s).   Specialty:  Family Medicine Contact information: [address] [place] [address] [phone]           Signed: Kipp Brood 10/19/2017, 4:38 PM

## 2017-10-19 NOTE — Anesthesia Postprocedure Evaluation (Signed)
Anesthesia Post Note  Patient: Kelly Douglas  Procedure(s) Performed: VIDEO BRONCHOSCOPY WITH ENDOBRONCHIAL NAVIGATION (N/A ) PLACEMENT OF 3 FUDUCIAL INTO LEFT LOWER LOBE OF LUNG-TARGET 1     Patient location during evaluation: PACU Anesthesia Type: General Level of consciousness: awake and alert, awake and oriented Pain management: pain level controlled Vital Signs Assessment: post-procedure vital signs reviewed and stable Respiratory status: spontaneous breathing, nonlabored ventilation and respiratory function stable Cardiovascular status: blood pressure returned to baseline and stable Postop Assessment: no apparent nausea or vomiting Anesthetic complications: no    Last Vitals:  Vitals:   10/18/17 2118 10/19/17 0547  BP: 131/81 (!) 144/75  Pulse: 88 88  Resp: 16 16  Temp: 36.6 C 37 C  SpO2: 100% 100%    Last Pain:  Vitals:   10/19/17 0547  TempSrc: Oral  PainSc:                  Catalina Gravel

## 2017-10-20 ENCOUNTER — Telehealth: Payer: Self-pay | Admitting: Emergency Medicine

## 2017-10-20 LAB — CULTURE, RESPIRATORY: CULTURE: NO GROWTH

## 2017-10-20 LAB — CULTURE, RESPIRATORY W GRAM STAIN

## 2017-10-20 NOTE — Telephone Encounter (Signed)
Attempted to call patient to review FOB results. Voice mailbox full. Brushing cytology showed some atypical cells but nothing definitive for malignancy. cx are still pending. She will need follow-up CT scan to look for stability. We can talk about timing at her ROV

## 2017-10-21 LAB — CULTURE, RESPIRATORY W GRAM STAIN

## 2017-10-21 LAB — CULTURE, RESPIRATORY

## 2017-10-26 ENCOUNTER — Ambulatory Visit (INDEPENDENT_AMBULATORY_CARE_PROVIDER_SITE_OTHER): Payer: Medicare HMO | Admitting: Adult Health

## 2017-10-26 ENCOUNTER — Ambulatory Visit (INDEPENDENT_AMBULATORY_CARE_PROVIDER_SITE_OTHER)
Admission: RE | Admit: 2017-10-26 | Discharge: 2017-10-26 | Disposition: A | Discharge status: Home / Self Care | Payer: Medicare HMO | Source: Ambulatory Visit | Attending: Adult Health | Admitting: Adult Health

## 2017-10-26 ENCOUNTER — Encounter: Payer: Self-pay | Admitting: Adult Health

## 2017-10-26 VITALS — BP 114/78 | HR 68 | Ht 68.0 in | Wt 154.8 lb

## 2017-10-26 DIAGNOSIS — J939 Pneumothorax, unspecified: Secondary | ICD-10-CM | POA: Diagnosis not present

## 2017-10-26 DIAGNOSIS — J449 Chronic obstructive pulmonary disease, unspecified: Secondary | ICD-10-CM | POA: Diagnosis not present

## 2017-10-26 DIAGNOSIS — R911 Solitary pulmonary nodule: Secondary | ICD-10-CM

## 2017-10-26 DIAGNOSIS — K029 Dental caries, unspecified: Secondary | ICD-10-CM | POA: Diagnosis not present

## 2017-10-26 DIAGNOSIS — A491 Streptococcal infection, unspecified site: Secondary | ICD-10-CM | POA: Diagnosis not present

## 2017-10-26 DIAGNOSIS — R918 Other nonspecific abnormal finding of lung field: Secondary | ICD-10-CM | POA: Diagnosis not present

## 2017-10-26 DIAGNOSIS — H5210 Myopia, unspecified eye: Secondary | ICD-10-CM | POA: Diagnosis not present

## 2017-10-26 DIAGNOSIS — J95811 Postprocedural pneumothorax: Secondary | ICD-10-CM

## 2017-10-26 MED ORDER — AMOXICILLIN-POT CLAVULANATE 875-125 MG PO TABS: 1.0000 | ORAL_TABLET | Freq: Two times a day (BID) | ORAL | 0 refills | Status: AC

## 2017-10-26 NOTE — Assessment & Plan Note (Signed)
Iatrogenic Pneumothorax s/p Bronchoscopy  Resolved now. CXR today reviewed by myself , no residual Pneumothorax.

## 2017-10-26 NOTE — Assessment & Plan Note (Signed)
Pulmonary Nodule on LLL , enlarged slightly on CT/PET with mild hypermetabolic activity  Bx /BAL /Cytology neg for malignant cells , atypical cells noted.  Will need serial follow up  BAL + strep viridans -pan sensitive, tx w/ abx   Plan  Patient Instructions  Begin Augmentin 838m Twice daily  For 7 days , take with food.  Follow up CT Chest in 3 months .  Need to follow up with PCP regarding enlarged lymph nodes under your arm( axillary lymph nodes )  Follow up in 3 months with Dr. BLamonte Sakai Or Jessey Stehlin NP after CT chest  Please contact office for sooner follow up if symptoms do not improve or worsen or seek emergency care

## 2017-10-26 NOTE — Assessment & Plan Note (Signed)
Strep Viridans + on BAL  tx w/ augmentin x 7 days   Plan  Patient Instructions  Begin Augmentin 829m Twice daily  For 7 days , take with food.  Follow up CT Chest in 3 months .  Need to follow up with PCP regarding enlarged lymph nodes under your arm( axillary lymph nodes )  Follow up in 3 months with Dr. BLamonte Sakai Or Luan Maberry NP after CT chest  Please contact office for sooner follow up if symptoms do not improve or worsen or seek emergency care

## 2017-10-26 NOTE — Patient Instructions (Addendum)
Begin Augmentin 875mg  Twice daily  For 7 days , take with food.  Follow up CT Chest in 3 months .  Need to follow up with PCP regarding enlarged lymph nodes under your arm( axillary lymph nodes )  Follow up in 3 months with Dr. Lamonte Sakai  Or Brevyn Ring NP after CT chest  Please contact office for sooner follow up if symptoms do not improve or worsen or seek emergency care

## 2017-10-26 NOTE — Assessment & Plan Note (Signed)
Discussed follow up with dentist

## 2017-10-26 NOTE — Assessment & Plan Note (Signed)
Mild COPD - appears stable  Albuterol As needed   Smoking cessation

## 2017-10-26 NOTE — Progress Notes (Signed)
_0  ID: Kelly Douglas, female    DOB: 02-24-1955, 62 y.o.   MRN: [id number]  Chief Complaint  Patient presents with  . Follow-up    Pulmonary nodules     Referring provider: [name], MD  HPI: 62 year old female active smoker followed for abnormal CT chest with scarring and lung nodules and mild COPD  Test CT chest October 2018> 11.3 mm irregular nodule left lower lobe PET scan November 2018 FDG uptake is discernible in the 11 mm left lower lobe nodule although lesion is not overtly hypermetabolic CT chest May 5462 slight interval increase in left lower lobe nodule measuring 1.1 x 1.0 cm PET scan October 03, 2017 low level hypermetabolism in the left lower lobe nodule measuring 12 mm, element of left greater than right axillary adenopathy that is hypermetabolic  70/35/0093 follow-up lung nodule, bronchoscopy results, pneumothorax Patient presents for a one-week follow-up.  Patient was found to have an enlarging left lower lobe nodule with hypermetabolic uptake on PET scan.  She underwent a bronchoscopy on October 18, 2017.  Pathology was negative for malignant cell.  Cytology showed atypical cells.  We discussed these test results.  Results were felt to be nondiagnostic and she will need to continue with CT surveillance.  BAL culture did show Strep viridans that is pansensitive.  AFB and fungal cultures were negative to date.  Unfortunately patient had a left pneumothorax status post bronchoscopy and was admitted requiring a pigtail catheter placement.  Patient has significant improvement.  Repeat chest x-ray On October 3 showed no residual pneumothorax.  Pigtail catheter was removed.  And patient was discharged home.  Patient says since discharge she is doing okay.  She denies any increased cough or shortness of breath.  Patient did have after procedure some blood-tinged mucus which she says has now resolved over the last 4 days. Chest x-ray today shows no pneumothorax.   With stable chronic changes.      Allergies  Allergen Reactions  . Aspirin Other (See Comments)    Nose bleeds    Immunization History  Administered Date(s) Administered  . Influenza Split 10/12/2011  . Influenza Whole 10/13/2009  . Influenza,inj,Quad PF,6+ Mos 11/01/2012, 09/16/2013, 11/20/2014, 10/27/2015, 11/28/2016, 09/26/2017  . Pneumococcal Conjugate-13 12/20/2013  . Td 06/24/2009  . Tdap 11/17/2010    Past Medical History:  Diagnosis Date  . Anxiety   . Arteriosclerotic cardiovascular disease (ASCVD) 2013   coronary calcification-left main, LAD and CX; small pericardial effusion  . Arthritis   . Chronic back pain   . Chronic bronchitis   . Chronic lung disease    Chronic scarring and volume loss-left lung; characteristics of a chronic infectious process-possible MAI  . Depression   . Dyspnea   . GERD (gastroesophageal reflux disease)    Dr Gala Romney EGD 09/2009->esophagitis, sm HH, antral erosions, atonic esophagus  . Hyperlipidemia   . Hypertension   . Hypothyroid 1981 approx  . Scleroderma ([place])   . Seasonal allergies   . Syncope    Multiple spells over the past 40+ years, likely neurocardiogenic  . Tobacco abuse 06/24/2009    Tobacco History: Social History   Tobacco Use  Smoking Status Current Every Day Smoker  . Packs/day: 0.50  . Years: 45.00  . Pack years: 22.50  . Types: Cigarettes  . Start date: 07/14/1971  Smokeless Tobacco Never Used  Tobacco Comment   0.5 PPD current plans to reduce    Ready to quit: No Counseling given: Yes Comment: 0.5  PPD current plans to reduce    Outpatient Medications Prior to Visit  Medication Sig Dispense Refill  . acetaminophen (TYLENOL) 650 MG CR tablet Take 1,300 mg by mouth every 8 (eight) hours as needed for pain.    Marland Kitchen albuterol (PROVENTIL HFA;VENTOLIN HFA) 108 (90 Base) MCG/ACT inhaler Inhale 2 puffs into the lungs every 6 (six) hours as needed for wheezing or shortness of breath. Shortness of breath    .  amLODipine (NORVASC) 5 MG tablet TAKE 1 TABLET EVERY DAY 90 tablet 1  . busPIRone (BUSPAR) 10 MG tablet Take 1 tablet (10 mg total) by mouth 3 (three) times daily. 270 tablet 1  . cholecalciferol (VITAMIN D) 1000 units tablet Take 1,000 Units by mouth daily.    . DULoxetine (CYMBALTA) 60 MG capsule Take 1 capsule (60 mg total) by mouth daily. 90 capsule 3  . gabapentin (NEURONTIN) 600 MG tablet Take 1 tablet (600 mg total) by mouth 2 (two) times daily.    Marland Kitchen levothyroxine (SYNTHROID, LEVOTHROID) 75 MCG tablet Take 0.5-1 tablets (37.5-75 mcg total) by mouth See admin instructions. Alternate taking 75 mcg and 37.5 mcg once daily    . Lidocaine-Menthol (ICY HOT LIDOCAINE PLUS MENTHOL) 4-1 % PTCH Apply 1 patch topically daily as needed (pain).    Marland Kitchen loperamide (IMODIUM A-D) 2 MG tablet Take 2-4 mg by mouth as needed for diarrhea or loose stools.    . meloxicam (MOBIC) 7.5 MG tablet Take 1 tablet (7.5 mg total) daily by mouth. 180 tablet 5  . montelukast (SINGULAIR) 10 MG tablet TAKE 1 TABLET AT BEDTIME 90 tablet 1  . Multiple Vitamins-Calcium (ONE-A-DAY WOMENS PO) Take 1 tablet by mouth daily.    . pantoprazole (PROTONIX) 40 MG tablet TAKE 1 TABLET TWICE DAILY 180 tablet 5  . potassium chloride (K-DUR) 10 MEQ tablet Take 1 tablet (10 mEq total) by mouth daily. 180 tablet 1  . pravastatin (PRAVACHOL) 40 MG tablet TAKE 1 TABLET EVERY DAY 90 tablet 1  . traMADol (ULTRAM) 50 MG tablet Take 1 tablet (50 mg total) by mouth 2 (two) times daily. 60 tablet 5   No facility-administered medications prior to visit.      Review of Systems  Constitutional:   No  weight loss, night sweats,  Fevers, chills,  +fatigue, or  lassitude.  HEENT:   No headaches,  Difficulty swallowing,  Tooth/dental problems, or  Sore throat,                No sneezing, itching, ear ache, +nasal congestion, post nasal drip,   CV:  No chest pain,  Orthopnea, PND, swelling in lower extremities, anasarca, dizziness, palpitations,  syncope.   GI  No heartburn, indigestion, abdominal pain, nausea, vomiting, diarrhea, change in bowel habits, loss of appetite, bloody stools.   Resp:    No chest wall deformity  Skin: no rash or lesions.  GU: no dysuria, change in color of urine, no urgency or frequency.  No flank pain, no hematuria   MS:  No joint pain or swelling.  No decreased range of motion.  No back pain.    Physical Exam  BP 114/78 (BP Location: Left Arm, Cuff Size: Normal)   Pulse 68   Ht _0  (1.727 m)   Wt 154 lb 12.8 oz (70.2 kg)   SpO2 98%   BMI 23.54 kg/m   GEN: A/Ox3; pleasant , NAD    HEENT:  [place]/AT,  EACs-clear, TMs-wnl, NOSE-clear, THROAT-clear, no lesions, no postnasal drip or  exudate noted. Very poor dentition -multple broken teeth/caries   NECK:  Supple w/ fair ROM; no JVD; normal carotid impulses w/o bruits; no thyromegaly or nodules palpated; no lymphadenopathy.    RESP  Clear  P & A; w/o, wheezes/ rales/ or rhonchi. no accessory muscle use, no dullness to percussion , left chest dressing removed, area healed and approximated, no redness. Area cleaned and small bandage placed.   CARD:  RRR, no m/r/g, no peripheral edema, pulses intact, no cyanosis or clubbing.  GI:   Soft & nt; nml bowel sounds; no organomegaly or masses detected.   Musco: Warm bil, no deformities or joint swelling noted.   Neuro: alert, no focal deficits noted.    Skin: Warm,  hypopimented areas noted     Lab Results:  CBC  No results found for: BNP  ProBNP No results found for: PROBNP  Imaging: Dg Chest 2 View  Result Date: 10/26/2017 CLINICAL DATA:  Left pneumothorax, congestion. EXAM: CHEST - 2 VIEW COMPARISON:  10/19/2017 and CT chest 05/18/2017. FINDINGS: There is leftward shift of the heart and mediastinum due to volume loss in the left hemithorax. Left apical pleuroparenchymal scarring, as on prior CT chest exams. Compensatory hypertrophy of the right lung. No airspace consolidation or pleural  fluid. No pneumothorax after removal of the small bore left chest tube. IMPRESSION: No acute findings. Electronically Signed   By: Lorin Picket M.D.   On: 10/26/2017 11:15   Nm Pet Image Initial (pi) Skull Base To Thigh  Result Date: 10/03/2017 CLINICAL DATA:  Subsequent treatment strategy for enlarging left lower lobe pulmonary nodule. EXAM: NUCLEAR MEDICINE PET SKULL BASE TO THIGH TECHNIQUE: 9.4 mCi F-18 FDG was injected intravenously. Full-ring PET imaging was performed from the skull base to thigh after the radiotracer. CT data was obtained and used for attenuation correction and anatomic localization. Fasting blood glucose: 99 mg/dl COMPARISON:  Chest CTs, most recent 05/18/2017. PET of 11/17/2016 also reviewed. FINDINGS: Mediastinal blood pool activity: SUV max 1.7 NECK: Physiologic true cord activity. No cervical nodal hypermetabolism. Incidental CT findings: No cervical adenopathy. CHEST: Development of left greater than right hypermetabolic axillary nodes. Index lateral left axillary node measures 12 mm and a S.U.V. max of 5.4 on image 72/3. Compare 8 mm and not hypermetabolic on the prior. A more central and inferior left axillary node measures 8 mm and a S.U.V. max of 2.8 on image 80/3. This measured approximately 4 mm and was not hypermetabolic on the prior. No significant hypermetabolism to correspond to the presumed left apical pleuroparenchymal scarring, which is similar on image 63/3. Central left lower lobe pulmonary nodule measures 1.4 cm and a S.U.V. max of 2.1 on image 124/3. Compare 1.2 cm and a S.U.V. max of 1.5 on the prior exam (when remeasured). No mediastinal or hilar nodal hypermetabolism. Incidental CT findings: Interstitial thickening and areas of peribronchovascular nodularity throughout the left lung are similar. Lad coronary artery atherosclerosis. Moderately dilated esophagus with fluid level within. ABDOMEN/PELVIS: Mildly prominent bilateral inguinal nodes with low-level  hypermetabolism. This is grossly similar. A left external iliac/high inguinal node measures 8 mm and a S.U.V. max of 1.8 on image 252/3. Compare similar in size and a S.U.V. max of 1.7 on the prior (when remeasured). Incidental CT findings: Abdominal aortic atherosclerosis. Normal adrenal glands. Moderate pelvic floor laxity. SKELETON: No abnormal marrow activity. Incidental CT findings: none IMPRESSION: 1. Redemonstration of low-level hypermetabolism within is slowly enlarging central left lower lobe pulmonary nodule. This remains suspicious for an  indolent primary bronchogenic carcinoma. 2. Hypermetabolism within enlarging left greater than right axillary nodes. Not typical of metastatic disease from indolent primary bronchogenic carcinoma. Differential considerations include reactive etiologies or an early lymphoproliferative process such as lymphoma. 3. Similar low-level hypermetabolism within inguinal nodes, favored to be reactive. 4. Esophageal air fluid level suggests dysmotility or gastroesophageal reflux. 5. Age advanced coronary artery atherosclerosis. Recommend assessment of coronary risk factors and consideration of medical therapy. 6.  Aortic Atherosclerosis (ICD10-I70.0). Electronically Signed   By: Abigail Miyamoto M.D.   On: 10/03/2017 07:36   Dg Chest Port 1 View  Result Date: 10/19/2017 CLINICAL DATA:  LEFT pneumothorax, follow-up EXAM: PORTABLE CHEST 1 VIEW COMPARISON:  Portable exam 1615 hours compared to 0631 hours FINDINGS: Pigtail LEFT thoracostomy tube unchanged. Normal heart size, mediastinal contours, and pulmonary vascularity. Rotated to the LEFT. No definite LEFT apex pneumothorax identified. Remaining lungs clear. No pleural effusion or acute osseous findings. IMPRESSION: LEFT thoracostomy tube without definite LEFT pneumothorax. Electronically Signed   By: Lavonia Dana M.D.   On: 10/19/2017 16:29   Dg Chest Port 1 View  Result Date: 10/19/2017 CLINICAL DATA:  Follow-up left chest  tube EXAM: PORTABLE CHEST 1 VIEW COMPARISON:  10/18/2017 FINDINGS: Pigtail chest tube is again noted on the left. The previously seen apical pneumothorax has resolved in the interval. No new infiltrate or effusion is seen. Cardiac shadow is stable. No bony abnormality is noted. IMPRESSION: Resolution of left-sided pneumothorax. No new focal abnormality is noted. Electronically Signed   By: Inez Catalina M.D.   On: 10/19/2017 08:30   Dg Chest Port 1 View  Result Date: 10/18/2017 CLINICAL DATA:  History of pneumothorax, chest tube placement, follow-up EXAM: PORTABLE CHEST 1 VIEW COMPARISON:  Portable chest x-ray of 10/18/2016 FINDINGS: A left chest tube is been inserted and there may be a tiny left apical pneumothorax present but considerable improvement is noted. The right lung is clear. Heart size is stable. IMPRESSION: Almost complete resolution of left pneumothorax with insertion of left chest tube. Electronically Signed   By: Ivar Drape M.D.   On: 10/18/2017 16:50   Dg Chest Port 1 View  Result Date: 10/18/2017 CLINICAL DATA:  Follow-up pneumothorax EXAM: PORTABLE CHEST 1 VIEW COMPARISON:  10/18/2017, CT 05/18/2017 FINDINGS: Right lung is clear. Moderate left pneumothorax, increased in size compared to prior, now with 2.2 cm of pleural-parenchymal separation apical laterally, previously this measured 8 mm. Increased component of pneumothorax at the left lateral lung base and medial chest. No midline shift. Mild opacity at the left base. Stable cardiomediastinal silhouette. IMPRESSION: 1. Interval increase in size of left-sided pneumothorax, now moderate in size, estimated at 25%. No midline shift. 2. Mild residual opacity at the left lung base. These results will be called to the ordering clinician or representative by the Radiologist Assistant, and communication documented in the PACS or zVision Dashboard. Electronically Signed   By: Donavan Foil M.D.   On: 10/18/2017 14:03   Dg Chest Port 1  View  Result Date: 10/18/2017 CLINICAL DATA:  Post bronchoscopy EXAM: PORTABLE CHEST 1 VIEW COMPARISON:  CT chest of 05/18/2017 FINDINGS: The lungs remain somewhat hyperaerated. The nodule noted previously in the left lung base by CT is not definitely seen by chest x-ray. However, 3 clips are noted within the medial left lung base after biopsy. There is a pneumothorax present of approximately 5-10%. There is some retraction of the superior tracheal air shadow toward the left apex probably due to scarring with mild  scarring within the left upper lobe near the apex. No pneumonia or effusion is currently seen. Mediastinal and hilar contours are otherwise unremarkable and heart size is no bony abnormality is IMPRESSION: 1. Left pneumothorax of approximately 10% post bronchoscopy. 2. Three metallic clips are present within the medial left lung base after biopsy. Electronically Signed   By: Ivar Drape M.D.   On: 10/18/2017 10:47   Dg C-arm Bronchoscopy  Result Date: 10/18/2017 C-ARM BRONCHOSCOPY: Fluoroscopy was utilized by the requesting physician.  No radiographic interpretation.     Assessment & Plan:   COPD (chronic obstructive pulmonary disease) (HCC) Mild COPD - appears stable  Albuterol As needed   Smoking cessation   Pneumothorax after biopsy Iatrogenic Pneumothorax s/p Bronchoscopy  Resolved now. CXR today reviewed by myself , no residual Pneumothorax.   Pulmonary nodules Pulmonary Nodule on LLL , enlarged slightly on CT/PET with mild hypermetabolic activity  Bx /BAL /Cytology neg for malignant cells , atypical cells noted.  Will need serial follow up  BAL + strep viridans -pan sensitive, tx w/ abx   Plan  Patient Instructions  Begin Augmentin 849m Twice daily  For 7 days , take with food.  Follow up CT Chest in 3 months .  Need to follow up with PCP regarding enlarged lymph nodes under your arm( axillary lymph nodes )  Follow up in 3 months with Dr. BLamonte Sakai Or Naja Apperson NP after CT  chest  Please contact office for sooner follow up if symptoms do not improve or worsen or seek emergency care       Streptococcus viridans infection Strep Viridans + on BAL  tx w/ augmentin x 7 days   Plan  Patient Instructions  Begin Augmentin 8798mTwice daily  For 7 days , take with food.  Follow up CT Chest in 3 months .  Need to follow up with PCP regarding enlarged lymph nodes under your arm( axillary lymph nodes )  Follow up in 3 months with Dr. ByLamonte SakaiOr Katera Rybka NP after CT chest  Please contact office for sooner follow up if symptoms do not improve or worsen or seek emergency care       Dental caries Discussed follow up with dentist      TaRexene EdisonNP 10/26/2017

## 2017-11-13 ENCOUNTER — Ambulatory Visit: Payer: Medicare HMO | Admitting: Emergency Medicine

## 2017-11-17 LAB — FUNGUS CULTURE WITH STAIN

## 2017-11-17 LAB — FUNGAL ORGANISM REFLEX

## 2017-11-17 LAB — FUNGUS CULTURE RESULT

## 2017-12-01 ENCOUNTER — Ambulatory Visit: Payer: Medicare HMO | Admitting: Internal Medicine

## 2017-12-01 LAB — ACID FAST CULTURE WITH REFLEXED SENSITIVITIES (MYCOBACTERIA): Acid Fast Culture: NEGATIVE

## 2017-12-01 LAB — ACID FAST CULTURE WITH REFLEXED SENSITIVITIES: ACID FAST CULTURE - AFSCU3: NEGATIVE

## 2017-12-05 ENCOUNTER — Other Ambulatory Visit: Payer: Self-pay

## 2017-12-05 DIAGNOSIS — Z9109 Other allergy status, other than to drugs and biological substances: Secondary | ICD-10-CM

## 2017-12-05 DIAGNOSIS — M25512 Pain in left shoulder: Secondary | ICD-10-CM

## 2017-12-05 DIAGNOSIS — M19019 Primary osteoarthritis, unspecified shoulder: Secondary | ICD-10-CM

## 2017-12-05 MED ORDER — BUSPIRONE HCL 10 MG PO TABS: 10.0000 mg | ORAL_TABLET | Freq: Three times a day (TID) | ORAL | 1 refills | Status: DC

## 2017-12-05 MED ORDER — MELOXICAM 7.5 MG PO TABS: 7.5000 mg | ORAL_TABLET | Freq: Every day | ORAL | 2 refills | Status: DC

## 2017-12-05 MED ORDER — MONTELUKAST SODIUM 10 MG PO TABS: 10.0000 mg | ORAL_TABLET | Freq: Every day | ORAL | 2 refills | Status: DC

## 2017-12-08 ENCOUNTER — Ambulatory Visit: Payer: Medicare HMO | Admitting: Internal Medicine

## 2017-12-11 ENCOUNTER — Telehealth: Payer: Self-pay | Admitting: *Deleted

## 2017-12-11 NOTE — Telephone Encounter (Signed)
Pharmacy called requesting new prescription for tamazepam 30 mg can be faxed to [phone].

## 2017-12-11 NOTE — Telephone Encounter (Signed)
Medication not on patient's med list. Looks like it may have been discontinued. If you want her to still be on the medication she would like it to be faxed to [place] please.

## 2017-12-11 NOTE — Telephone Encounter (Signed)
I tried to call to verify the need for med from the pt, n o answer, once you verify let me know. If unable to get her I will address at the December appt

## 2017-12-12 NOTE — Telephone Encounter (Signed)
Called patient to verify which medication she is wanting. Unable to complete call. Will try again later.

## 2017-12-13 NOTE — Telephone Encounter (Signed)
Tried to call patient regarding Temazepam. Phone rings and then says call can't be completed at this time. Will try again later. If no answer will mail letter.

## 2017-12-18 ENCOUNTER — Other Ambulatory Visit: Payer: Self-pay | Admitting: Family Medicine

## 2017-12-18 DIAGNOSIS — E785 Hyperlipidemia, unspecified: Secondary | ICD-10-CM | POA: Diagnosis not present

## 2017-12-18 DIAGNOSIS — I10 Essential (primary) hypertension: Secondary | ICD-10-CM | POA: Diagnosis not present

## 2017-12-18 DIAGNOSIS — E038 Other specified hypothyroidism: Secondary | ICD-10-CM | POA: Diagnosis not present

## 2017-12-19 ENCOUNTER — Ambulatory Visit (INDEPENDENT_AMBULATORY_CARE_PROVIDER_SITE_OTHER): Payer: Medicare HMO | Admitting: Family Medicine

## 2017-12-19 ENCOUNTER — Encounter: Payer: Self-pay | Admitting: Family Medicine

## 2017-12-19 VITALS — BP 133/85 | HR 85 | Resp 12 | Ht 68.0 in | Wt 155.1 lb

## 2017-12-19 DIAGNOSIS — N3000 Acute cystitis without hematuria: Secondary | ICD-10-CM | POA: Diagnosis not present

## 2017-12-19 DIAGNOSIS — E785 Hyperlipidemia, unspecified: Secondary | ICD-10-CM

## 2017-12-19 DIAGNOSIS — F17210 Nicotine dependence, cigarettes, uncomplicated: Secondary | ICD-10-CM | POA: Diagnosis not present

## 2017-12-19 DIAGNOSIS — F17218 Nicotine dependence, cigarettes, with other nicotine-induced disorders: Secondary | ICD-10-CM

## 2017-12-19 DIAGNOSIS — Z0000 Encounter for general adult medical examination without abnormal findings: Secondary | ICD-10-CM | POA: Diagnosis not present

## 2017-12-19 DIAGNOSIS — R252 Cramp and spasm: Secondary | ICD-10-CM

## 2017-12-19 DIAGNOSIS — I10 Essential (primary) hypertension: Secondary | ICD-10-CM

## 2017-12-19 DIAGNOSIS — M19019 Primary osteoarthritis, unspecified shoulder: Secondary | ICD-10-CM

## 2017-12-19 LAB — CBC
Hematocrit: 37.1 % (ref 34.0–46.6)
Hemoglobin: 12.1 g/dL (ref 11.1–15.9)
MCH: 26.7 pg (ref 26.6–33.0)
MCHC: 32.6 g/dL (ref 31.5–35.7)
MCV: 82 fL (ref 79–97)
Platelets: 332 10*3/uL (ref 150–450)
RBC: 4.53 x10E6/uL (ref 3.77–5.28)
RDW: 14.8 % (ref 12.3–15.4)
WBC: 8.3 10*3/uL (ref 3.4–10.8)

## 2017-12-19 LAB — COMPREHENSIVE METABOLIC PANEL
A/G RATIO: 2 (ref 1.2–2.2)
ALBUMIN: 4.7 g/dL (ref 3.6–4.8)
ALT: 10 IU/L (ref 0–32)
AST: 18 IU/L (ref 0–40)
Alkaline Phosphatase: 89 IU/L (ref 39–117)
BILIRUBIN TOTAL: 0.6 mg/dL (ref 0.0–1.2)
BUN / CREAT RATIO: 3 — AB (ref 12–28)
BUN: 3 mg/dL — AB (ref 8–27)
CHLORIDE: 94 mmol/L — AB (ref 96–106)
CO2: 28 mmol/L (ref 20–29)
Calcium: 8.1 mg/dL — ABNORMAL LOW (ref 8.7–10.3)
Creatinine, Ser: 0.92 mg/dL (ref 0.57–1.00)
GFR calc non Af Amer: 67 mL/min/{1.73_m2} (ref >59)
GFR, EST AFRICAN AMERICAN: 77 mL/min/{1.73_m2} (ref >59)
GLUCOSE: 87 mg/dL (ref 65–99)
Globulin, Total: 2.3 g/dL (ref 1.5–4.5)
POTASSIUM: 3.5 mmol/L (ref 3.5–5.2)
Sodium: 141 mmol/L (ref 134–144)
TOTAL PROTEIN: 7 g/dL (ref 6.0–8.5)

## 2017-12-19 LAB — POCT URINALYSIS DIP (CLINITEK)
BILIRUBIN UA: NEGATIVE
Blood, UA: NEGATIVE
GLUCOSE UA: NEGATIVE mg/dL
Ketones, POC UA: NEGATIVE mg/dL
LEUKOCYTES UA: NEGATIVE
NITRITE UA: NEGATIVE
POC PROTEIN,UA: NEGATIVE
Spec Grav, UA: 1.015 (ref 1.010–1.025)
Urobilinogen, UA: 2 E.U./dL — AB
pH, UA: 7.5 (ref 5.0–8.0)

## 2017-12-19 LAB — LIPID PANEL
CHOL/HDL RATIO: 3.2 ratio (ref 0.0–4.4)
Cholesterol, Total: 126 mg/dL (ref 100–199)
HDL: 40 mg/dL (ref >39)
LDL CALC: 61 mg/dL (ref 0–99)
Triglycerides: 126 mg/dL (ref 0–149)
VLDL CHOLESTEROL CAL: 25 mg/dL (ref 5–40)

## 2017-12-19 LAB — SPECIMEN STATUS REPORT

## 2017-12-19 LAB — TSH: TSH: 1.62 u[IU]/mL (ref 0.450–4.500)

## 2017-12-19 MED ORDER — MELOXICAM 7.5 MG PO TABS: 7.5000 mg | ORAL_TABLET | Freq: Every day | ORAL | 3 refills | Status: DC

## 2017-12-19 MED ORDER — LEVOTHYROXINE SODIUM 75 MCG PO TABS: 37.5000 ug | ORAL_TABLET | ORAL | 3 refills | Status: DC

## 2017-12-19 MED ORDER — PRAVASTATIN SODIUM 40 MG PO TABS: 40.0000 mg | ORAL_TABLET | Freq: Every day | ORAL | 3 refills | Status: DC

## 2017-12-19 MED ORDER — PANTOPRAZOLE SODIUM 40 MG PO TBEC: 40.0000 mg | DELAYED_RELEASE_TABLET | Freq: Two times a day (BID) | ORAL | 3 refills | Status: DC

## 2017-12-19 MED ORDER — AMLODIPINE BESYLATE 5 MG PO TABS: 5.0000 mg | ORAL_TABLET | Freq: Every day | ORAL | 3 refills | Status: DC

## 2017-12-19 MED ORDER — DULOXETINE HCL 60 MG PO CPEP: 60.0000 mg | ORAL_CAPSULE | Freq: Every day | ORAL | 3 refills | Status: DC

## 2017-12-19 MED ORDER — POTASSIUM CHLORIDE ER 10 MEQ PO TBCR: 10.0000 meq | EXTENDED_RELEASE_TABLET | Freq: Every day | ORAL | 3 refills | Status: DC

## 2017-12-19 NOTE — Telephone Encounter (Signed)
Letter mailed

## 2017-12-19 NOTE — Patient Instructions (Addendum)
Wellness with nurse is past due, please schedule asap  MD follow up  appt in early May, needs fasting lipid, cmp and EGFr, TSH and pTH 1 week befopre visit  Please continue to cut back on smoking , quitting is the goal  Urine is being tested for infection, if abnormal , I will prescribe 3 days of antibiotic and follow up on culture report     

## 2017-12-21 ENCOUNTER — Telehealth: Payer: Self-pay | Admitting: *Deleted

## 2017-12-21 NOTE — Telephone Encounter (Signed)
Pt wanted a call back regarding the results of her specimen. Can be reached at [phone]

## 2017-12-22 NOTE — Telephone Encounter (Signed)
Tried calling patient back. No answer. Will try again later.

## 2017-12-23 ENCOUNTER — Encounter: Payer: Self-pay | Admitting: Family Medicine

## 2017-12-23 DIAGNOSIS — N3000 Acute cystitis without hematuria: Secondary | ICD-10-CM | POA: Insufficient documentation

## 2017-12-23 NOTE — Assessment & Plan Note (Signed)
Annual exam as documented. Counseling done  re healthy lifestyle involving commitment to 150 minutes exercise per week, heart healthy diet, and attaining healthy weight.The importance of adequate sleep also discussed. Changes in health habits are decided on by the patient with goals and time frames  set for achieving them. Immunization and cancer screening needs are specifically addressed at this visit. 

## 2017-12-23 NOTE — Assessment & Plan Note (Signed)
3 day h/o mild symptoms , CCUA negative for infection, pt reassured no evidence of infection, and encouraged  to push fluids

## 2017-12-23 NOTE — Assessment & Plan Note (Addendum)
Asked:confirms currently smokes cigarettes Assess: Unwilling to quit but cutting back Advise: needs to QUIT to reduce risk of cancer,erecently had lung biopsy, negative for cancer, already has CAD by imaging  cardio and cerebrovascular disease Assist: counseled for 5 minutes and literature provided Arrange: follow up in 5 months

## 2017-12-23 NOTE — Progress Notes (Signed)
    Kelly Douglas     MRN: [id number]      DOB: 06-Sep-1955  HPI: Patient is in for annual physical exam. C/o 3 day h/o  Malodorous urine and frequency.no fever, chills or flank pain, mild dysuria Recent labs,  are reviewed. Immunization is reviewed , and  Is up to  date  PE: BP 133/85 (BP Location: Left Arm, Patient Position: Sitting)   Pulse 85   Resp 12   Ht 5\' 8"  (1.727 m)   Wt 155 lb 1.9 oz (70.4 kg)   BMI 23.59 kg/m   Pleasant  female, alert and oriented x 3, in no cardio-pulmonary distress. Afebrile. HEENT No facial trauma or asymetry. Sinuses non tender.  Extra occullar muscles intact, External ears normal, tympanic membranes clear. Oropharynx moist, no exudate.Poor dentition Neck: supple, no adenopathy,JVD or thyromegaly.No bruits.  Chest: Clear to ascultation bilaterally.No crackles or wheezes.Decreased air entry Non tender to palpation  Breast: No asymetry,no masses or lumps. No tenderness. No nipple discharge or inversion. No axillary or supraclavicular adenopathy  Cardiovascular system; Heart sounds normal,  S1 and  S2 ,no S3.  No murmur, or thrill. Apical beat not displaced Peripheral pulses normal.  Abdomen: Soft, non tender, no organomegaly or masses. No renal angle tenderness, mild suprapubic tenderness No bruits. Bowel sounds normal. No guarding, tenderness or rebound.  .   Musculoskeletal exam: Decreased  ROM of spine, adequate in hips , shoulders and knees. No deformity ,swelling or crepitus noted. No muscle wasting or atrophy.   Neurologic: Cranial nerves 2 to 12 intact. Power, tone ,sensation and reflexes normal throughout. No disturbance in gait. No tremor.  Skin: Intact, no ulceration, erythema , scaling or rash noted. Pigmentation normal throughout  Psych; Normal mood and affect. Judgement and concentration normal   Assessment & Plan:  Annual physical exam Annual exam as documented. Counseling done  re healthy lifestyle  involving commitment to 150 minutes exercise per week, heart healthy diet, and attaining healthy weight.The importance of adequate sleep also discussed.  Changes in health habits are decided on by the patient with goals and time frames  set for achieving them. Immunization and cancer screening needs are specifically addressed at this visit.   Nicotine dependence Asked:confirms currently smokes cigarettes Assess: Unwilling to quit but cutting back Advise: needs to QUIT to reduce risk of cancer,erecently had lung biopsy, negative for cancer, already has CAD by imaging  cardio and cerebrovascular disease Assist: counseled for 5 minutes and literature provided Arrange: follow up in 5 months   Acute cystitis without hematuria 3 day h/o mild symptoms , CCUA negative for infection, pt reassured no evidence of infection, and encouraged  to push fluids

## 2018-01-22 ENCOUNTER — Telehealth: Payer: Self-pay | Admitting: Emergency Medicine

## 2018-01-23 NOTE — Telephone Encounter (Signed)
Pt has auth w/ [employer] medicare from 01/23/18-04/23/18 auth# [id number].

## 2018-01-25 ENCOUNTER — Ambulatory Visit (HOSPITAL_COMMUNITY)
Admission: RE | Admit: 2018-01-25 | Discharge: 2018-01-25 | Disposition: A | Discharge status: Home / Self Care | Payer: Medicare HMO | Source: Ambulatory Visit | Attending: Adult Health | Admitting: Adult Health

## 2018-01-25 DIAGNOSIS — R911 Solitary pulmonary nodule: Secondary | ICD-10-CM | POA: Diagnosis present

## 2018-01-26 ENCOUNTER — Ambulatory Visit: Payer: Medicare HMO | Admitting: Adult Health

## 2018-01-29 ENCOUNTER — Ambulatory Visit: Payer: Medicare HMO | Admitting: Adult Health

## 2018-01-29 ENCOUNTER — Encounter: Payer: Self-pay | Admitting: Adult Health

## 2018-01-29 DIAGNOSIS — J449 Chronic obstructive pulmonary disease, unspecified: Secondary | ICD-10-CM

## 2018-01-29 DIAGNOSIS — M349 Systemic sclerosis, unspecified: Secondary | ICD-10-CM | POA: Diagnosis not present

## 2018-01-29 DIAGNOSIS — R911 Solitary pulmonary nodule: Secondary | ICD-10-CM | POA: Diagnosis not present

## 2018-01-29 DIAGNOSIS — R918 Other nonspecific abnormal finding of lung field: Secondary | ICD-10-CM | POA: Diagnosis not present

## 2018-01-29 NOTE — Patient Instructions (Addendum)
Follow up CT Chest in 6 months .  Need to follow up with PCP regarding enlarged lymph nodes under your arm( axillary lymph nodes )  Follow up with dentist as discussed  Work on not smoking .  Albuterol Inhaler As needed  Wheezing .  Follow up in 3 months with Dr. Lamonte Sakai and As needed   Please contact office for sooner follow up if symptoms do not improve or worsen or seek emergency care

## 2018-01-29 NOTE — Assessment & Plan Note (Signed)
Mild COPD , smoking cessation discussed  Albuterol As needed

## 2018-01-29 NOTE — Progress Notes (Signed)
_0  ID: Kelly Douglas, female    DOB: Aug 05, 1955, 63 y.o.   MRN: [id number]  Chief Complaint  Patient presents with  . Follow-up    Lung nodule     Referring provider: [name], MD  HPI: 62 year old female active smoker followed for abnormal CT chest with scarring and lung nodules and mild COPD Dx w/ scleroderma   Test CT chest October 2018> 11.3 mm irregular nodule left lower lobe PET scan November 2018 FDG uptake is discernible in the 11 mm left lower lobe nodule although lesion is not overtly hypermetabolic CT chest May 4010 slight interval increase in left lower lobe nodule measuring 1.1 x 1.0 cm PET scan October 03, 2017 low level hypermetabolism in the left lower lobe nodule measuring 12 mm, element of left greater than right axillary adenopathy that is hypermetabolic Echo 02/7251 >EF 66%, DD    01/29/2018 Follow up : Lung nodule  Patient presents for a 69-monthfollow-up.  Patient was found to have an enlarging left lower lobe nodule with hypermetabolic uptake on PET scan.  She underwent a navigational  bronchoscopy on October 18, 2017 pathology was negative for malignant cells.  Cytology showed atypical cells.  Patient's results were felt to be nondiagnostic and needed to continue with CT surveillance.  BAL culture showed strep viridans that was pansensitive.  AFB and fungal cultures were negative to date.  Patient was treated with antibiotics. Unfortunately she had a left pneumothorax status post bronchoscopy and required a pigtail catheter placement.  This improved and did not have any reoccurrence. CT chest was repeated on January 25, 2018 that showed me no mediastinal lymphadenopathy.  There was stable appearance of small axillary and subpectoral lymph nodes bilaterally. Stable peribronchiolar nodularity with architectural distortion in the left upper lobe.  Volume loss of the left hemothorax.  Left apex opacity stable.  Left lower lobe nodule measuring 11 x  10 mm is stable without change.  Second left lower lobe nodule stable at 5 mm.  Subtle changes of emphysema noted.   Since last visit says she is doing okay . Has come intermittent cough and congestion and sinus drainage. Still smoking , discussed smoking cessation .  Had mammogram in 2019 , reported as normal .   She carries dx of scleroderma followed by rheumatology and dermatology.    Allergies  Allergen Reactions  . Aspirin Other (See Comments)    Nose bleeds    Immunization History  Administered Date(s) Administered  . Influenza Split 10/12/2011  . Influenza Whole 10/13/2009  . Influenza,inj,Quad PF,6+ Mos 11/01/2012, 09/16/2013, 11/20/2014, 10/27/2015, 11/28/2016, 09/26/2017  . Pneumococcal Conjugate-13 12/20/2013  . Td 06/24/2009  . Tdap 11/17/2010    Past Medical History:  Diagnosis Date  . Anxiety   . Arteriosclerotic cardiovascular disease (ASCVD) 2013   coronary calcification-left main, LAD and CX; small pericardial effusion  . Arthritis   . Chronic back pain   . Chronic bronchitis   . Chronic lung disease    Chronic scarring and volume loss-left lung; characteristics of a chronic infectious process-possible MAI  . Depression   . Dyspnea   . GERD (gastroesophageal reflux disease)    Dr RGala RomneyEGD 09/2009->esophagitis, sm HH, antral erosions, atonic esophagus  . Hyperlipidemia   . Hypertension   . Hypothyroid 1981 approx  . Scleroderma ([place]   . Seasonal allergies   . Syncope    Multiple spells over the past 40+ years, likely neurocardiogenic  . Tobacco abuse 06/24/2009  Tobacco History: Social History   Tobacco Use  Smoking Status Current Every Day Smoker  . Packs/day: 0.50  . Years: 45.00  . Pack years: 22.50  . Types: Cigarettes  . Start date: 07/14/1971  Smokeless Tobacco Never Used  Tobacco Comment   0.5 PPD current plans to reduce    Ready to quit: Not Answered Counseling given: Not Answered Comment: 0.5 PPD current plans to reduce     Outpatient Medications Prior to Visit  Medication Sig Dispense Refill  . acetaminophen (TYLENOL) 650 MG CR tablet Take 1,300 mg by mouth every 8 (eight) hours as needed for pain.    Marland Kitchen albuterol (PROVENTIL HFA;VENTOLIN HFA) 108 (90 Base) MCG/ACT inhaler Inhale 2 puffs into the lungs every 6 (six) hours as needed for wheezing or shortness of breath. Shortness of breath    . amLODipine (NORVASC) 5 MG tablet Take 1 tablet (5 mg total) by mouth daily. 90 tablet 3  . busPIRone (BUSPAR) 10 MG tablet Take 1 tablet (10 mg total) by mouth 3 (three) times daily. 270 tablet 1  . cholecalciferol (VITAMIN D) 1000 units tablet Take 1,000 Units by mouth daily.    . DULoxetine (CYMBALTA) 60 MG capsule Take 1 capsule (60 mg total) by mouth daily. 90 capsule 3  . gabapentin (NEURONTIN) 600 MG tablet Take 1 tablet (600 mg total) by mouth 2 (two) times daily.    Marland Kitchen levothyroxine (SYNTHROID, LEVOTHROID) 75 MCG tablet Take 0.5-1 tablets (37.5-75 mcg total) by mouth See admin instructions. Alternate taking 75 mcg and 37.5 mcg once daily 90 tablet 3  . Lidocaine-Menthol (ICY HOT LIDOCAINE PLUS MENTHOL) 4-1 % PTCH Apply 1 patch topically daily as needed (pain).    Marland Kitchen loperamide (IMODIUM A-D) 2 MG tablet Take 2-4 mg by mouth as needed for diarrhea or loose stools.    . meloxicam (MOBIC) 7.5 MG tablet Take 1 tablet (7.5 mg total) by mouth daily. 90 tablet 3  . montelukast (SINGULAIR) 10 MG tablet Take 1 tablet (10 mg total) by mouth at bedtime. 90 tablet 2  . pantoprazole (PROTONIX) 40 MG tablet Take 1 tablet (40 mg total) by mouth 2 (two) times daily. 180 tablet 3  . potassium chloride (K-DUR) 10 MEQ tablet Take 1 tablet (10 mEq total) by mouth daily. 90 tablet 3  . pravastatin (PRAVACHOL) 40 MG tablet Take 1 tablet (40 mg total) by mouth daily. 90 tablet 3  . traMADol (ULTRAM) 50 MG tablet Take 1 tablet (50 mg total) by mouth 2 (two) times daily. 60 tablet 5   No facility-administered medications prior to visit.       Review of Systems:   Constitutional:   No  weight loss, night sweats,  Fevers, chills,  +fatigue, or  lassitude.  HEENT:   No headaches,  Difficulty swallowing,  Tooth/dental problems, or  Sore throat,                No sneezing, itching, ear ache, nasal congestion, post nasal drip,   CV:  No chest pain,  Orthopnea, PND, swelling in lower extremities, anasarca, dizziness, palpitations, syncope.   GI  No heartburn, indigestion, abdominal pain, nausea, vomiting, diarrhea, change in bowel habits, loss of appetite, bloody stools.   Resp: No non-productive cough,  No coughing up of blood.  No change in color of mucus.  No wheezing.  No chest wall deformity  Skin: no rash or lesions.  GU: no dysuria, change in color of urine, no urgency or frequency.  No  flank pain, no hematuria   MS:  No joint pain or swelling.  No decreased range of motion.  No back pain.    Physical Exam  BP (!) 142/70 (BP Location: Left Arm, Cuff Size: Normal)   Pulse 94   Ht _0  (1.727 m)   Wt 151 lb (68.5 kg)   SpO2 96%   BMI 22.96 kg/m   GEN: A/Ox3; pleasant , NAD, elderly    HEENT:  [place]/AT,  EACs-clear, TMs-wnl, NOSE-clear, THROAT-clear, no lesions, no postnasal drip or exudate noted. Very poor dentition   NECK:  Supple w/ fair ROM; no JVD; normal carotid impulses w/o bruits; no thyromegaly or nodules palpated; no lymphadenopathy.    RESP  Clear  P & A; w/o, wheezes/ rales/ or rhonchi. no accessory muscle use, no dullness to percussion  CARD:  RRR, no m/r/g, no peripheral edema, pulses intact, no cyanosis or clubbing.  GI:   Soft & nt; nml bowel sounds; no organomegaly or masses detected.   Musco: Warm bil, no deformities or joint swelling noted.   Neuro: alert, no focal deficits noted.    Skin: Warm, no lesions or rashes    Lab Results:  CBC  ProBNP No results found for: PROBNP  Imaging: Ct Chest Wo Contrast  Result Date: 01/25/2018 CLINICAL DATA:  Solitary pulmonary nodule.  EXAM: CT CHEST WITHOUT CONTRAST TECHNIQUE: Multidetector CT imaging of the chest was performed following the standard protocol without IV contrast. COMPARISON:  PET-CT 10/02/2017.  Chest CT 05/18/2017. FINDINGS: Cardiovascular: The heart size is normal. No substantial pericardial effusion. Coronary artery calcification is evident. Mediastinum/Nodes: No mediastinal lymphadenopathy. No evidence for gross hilar lymphadenopathy although assessment is limited by the lack of intravenous contrast on today's study. Fluid/debris in the mid/distal esophagus may be related to dysmotility or reflux. Stable appearance small axillary and subpectoral lymph nodes bilaterally. Lungs/Pleura: The central tracheobronchial airways are patent. Peribronchovascular nodularity with architectural distortion in the left upper lobe similar to prior exams. Volume loss in the left hemithorax is also stable. The pleuroparenchymal opacity in the posterior left apex is stable. The central left lower lobe nodule (100/4) is stable, measuring 11 x 10 mm today which compares to 11 x 10 mm on the study from 05/18/2017. A second nodule in the left lower lobe, more anteriorly on 97/4 is stable at 5 mm. Subtle changes of centrilobular emphysema noted. No suspicious nodule or mass in the right lung. Upper Abdomen: Unremarkable. Musculoskeletal: No worrisome lytic or sclerotic osseous abnormality. IMPRESSION: 1. Stable exam. No new or progressive interval findings. The left lower lobe pulmonary nodules are unchanged since PET-CT and prior diagnostic CT of 05/18/2017. 2. Similar appearance of pleuroparenchymal opacity in the posterior left apex with associated volume loss and micro nodularity in the left upper lobe. 3. Stable mediastinal and bilateral axillary lymph nodes. 4.  Emphysema. (ICD10-J43.9) 5.  Aortic Atherosclerois (ICD10-170.0) Electronically Signed   By: Misty Stanley M.D.   On: 01/25/2018 16:29      PFT Results Latest Ref Rng & Units  09/05/2017 04/24/2015  FVC-Pre L 3.80 3.22  FVC-Predicted Pre % 124 102  FVC-Post L 3.67 3.42  FVC-Predicted Post % 119 109  Pre FEV1/FVC % % 62 61  Post FEV1/FCV % % 61 67  FEV1-Pre L 2.34 1.95  FEV1-Predicted Pre % 97 79  FEV1-Post L 2.25 2.31  DLCO UNC% % 28 29  DLCO COR %Predicted % 30 36  TLC L 6.06 5.95  TLC % Predicted % 107  105  RV % Predicted % 97 126    No results found for: NITRICOXIDE      Assessment & Plan:   COPD (chronic obstructive pulmonary disease) (HCC) Mild COPD , smoking cessation discussed  Albuterol As needed     Scleroderma Skin changes noted , previous echo no mention of pulmonary htn  Not on immunosuprression  Cont follow up with Rheumatology and Dermatolgoy .  Check HRCT Chest in 6 months PFT in 1 year .   Pulmonary nodules Stable on CT chest - Navigational Bronch non diagnostic 10/2017  Will need ongoing survellance  Smoking cessaton   Plan  Patient Instructions  Follow up CT Chest in 6 months .  Need to follow up with PCP regarding enlarged lymph nodes under your arm( axillary lymph nodes )  Follow up with dentist as discussed  Work on not smoking .  Albuterol Inhaler As needed  Wheezing .  Follow up in 3 months with Dr. Lamonte Sakai and As needed   Please contact office for sooner follow up if symptoms do not improve or worsen or seek emergency care            Rexene Edison, NP 01/29/2018

## 2018-01-29 NOTE — Assessment & Plan Note (Signed)
Skin changes noted , previous echo no mention of pulmonary htn  Not on immunosuprression  Cont follow up with Rheumatology and Dermatolgoy .  Check HRCT Chest in 6 months PFT in 1 year .

## 2018-01-29 NOTE — Assessment & Plan Note (Signed)
Stable on CT chest - Navigational Bronch non diagnostic 10/2017  Will need ongoing survellance  Smoking cessaton   Plan  Patient Instructions  Follow up CT Chest in 6 months .  Need to follow up with PCP regarding enlarged lymph nodes under your arm( axillary lymph nodes )  Follow up with dentist as discussed  Work on not smoking .  Albuterol Inhaler As needed  Wheezing .  Follow up in 3 months with Dr. Lamonte Sakai and As needed   Please contact office for sooner follow up if symptoms do not improve or worsen or seek emergency care

## 2018-01-30 ENCOUNTER — Ambulatory Visit (INDEPENDENT_AMBULATORY_CARE_PROVIDER_SITE_OTHER): Payer: Medicare HMO | Admitting: Internal Medicine

## 2018-01-30 ENCOUNTER — Encounter: Payer: Self-pay | Admitting: Internal Medicine

## 2018-01-30 VITALS — BP 142/87 | HR 104 | Temp 97.8°F | Ht 68.0 in | Wt 151.0 lb

## 2018-01-30 DIAGNOSIS — K219 Gastro-esophageal reflux disease without esophagitis: Secondary | ICD-10-CM

## 2018-01-30 DIAGNOSIS — R194 Change in bowel habit: Secondary | ICD-10-CM | POA: Diagnosis not present

## 2018-01-30 NOTE — Progress Notes (Signed)
Primary Care Physician:  [name], MD Primary Gastroenterologist:  Dr. Gala Romney  Pre-Procedure History & Physical: HPI:  Kelly Douglas is a 63 y.o. female here for follow-up of GERD and change in bowel habits.  GERD continues to be well controlled with the daily Protonix.  No dysphagia.  Patient has history of IBS D.  Occasional incontinence.  Could not afford cholestyramine or Levsin.  Has done well with Imodium.  She has been varying the dosage regimen.  She feels like she needs to take at least 1 daily however sometimes he takes more she actually gets constipated.  This regimen, episodes of incontinence have essentially resolved.  Much of the time she has normal bowel function.  History of colonic adenoma; due for surveillance colonoscopy 2021. Recently had a bronchoscopy for benign disease.  Procedure complicated by pneumothorax.  She is gotten over that just fine.  Past Medical History:  Diagnosis Date  . Anxiety   . Arteriosclerotic cardiovascular disease (ASCVD) 2013   coronary calcification-left main, LAD and CX; small pericardial effusion  . Arthritis   . Chronic back pain   . Chronic bronchitis   . Chronic lung disease    Chronic scarring and volume loss-left lung; characteristics of a chronic infectious process-possible MAI  . Depression   . Dyspnea   . GERD (gastroesophageal reflux disease)    Dr Gala Romney EGD 09/2009->esophagitis, sm HH, antral erosions, atonic esophagus  . Hyperlipidemia   . Hypertension   . Hypothyroid 1981 approx  . Scleroderma ([place])   . Seasonal allergies   . Syncope    Multiple spells over the past 40+ years, likely neurocardiogenic  . Tobacco abuse 06/24/2009    Past Surgical History:  Procedure Laterality Date  . BILATERAL SALPINGOOPHORECTOMY  2013    Dr. Elonda Husky; uterus remains in situ  . COLONOSCOPY  09/28/09   anal papilla otherwise normal  . COLONOSCOPY N/A 08/29/2012   [id number] polyp-removed as described above. tubular adenoma    . ESOPHAGOGASTRODUODENOSCOPY  10/07/09   Dr. Barrie Dunker of esophageal mucosa, diffusely ?esophagitis (bx benign), small HH/antral erosions, erythema bx benign. 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 ESOPHAGOGASTRODUODENOSCOPY (EGD) WITH ESOPHAGEAL DILATION N/A 03/07/2012   [id number], patent, tubular esophagus of uncertain significance-status post biopsy )unremarkable). Hiatal hernia  . FUDUCIAL PLACEMENT  10/18/2017   Procedure: PLACEMENT OF 3 FUDUCIAL INTO LEFT LOWER LOBE OF LUNG-TARGET 1;  Surgeon: Collene Gobble, MD;  Location: [place];  Service: Thoracic;;  . LAPAROSCOPIC APPENDECTOMY  07/16/2011   Procedure: APPENDECTOMY LAPAROSCOPIC;  Surgeon: Donato Heinz, MD;  Location: [facility];  Service: General;  Laterality: N/A;  . TUBAL LIGATION    . VIDEO BRONCHOSCOPY  10/18/2017  . VIDEO BRONCHOSCOPY WITH ENDOBRONCHIAL NAVIGATION N/A 10/18/2017   Procedure: VIDEO BRONCHOSCOPY WITH ENDOBRONCHIAL NAVIGATION;  Surgeon: Collene Gobble, MD;  Location: [facility];  Service: Thoracic;  Laterality: N/A;    Prior to Admission medications   Medication Sig Start Date End Date Taking? Authorizing Provider  acetaminophen (TYLENOL) 650 MG CR tablet Take 1,300 mg by mouth every 8 (eight) hours as needed for pain.   Yes [provider]  albuterol (PROVENTIL HFA;VENTOLIN HFA) 108 (90 Base) MCG/ACT inhaler Inhale 2 puffs into the lungs every 6 (six) hours as needed for wheezing or shortness of breath. Shortness of breath 10/18/17  Yes Byrum, Rose Fillers, MD  amLODipine (NORVASC) 5 MG tablet Take 1 tablet (5 mg total) by mouth daily. 12/19/17  Yes [name], MD  busPIRone (BUSPAR) 10 MG tablet Take 1 tablet (10 mg total) by mouth 3 (three) times daily. 12/05/17  Yes [name], MD  cholecalciferol (VITAMIN D) 1000 units tablet Take 1,000 Units by mouth daily.   Yes [provider]  gabapentin (NEURONTIN) 600 MG tablet Take 1 tablet (600 mg total) by mouth 2 (two) times  daily. 10/18/17  Yes Collene Gobble, MD  levothyroxine (SYNTHROID, LEVOTHROID) 75 MCG tablet Take 0.5-1 tablets (37.5-75 mcg total) by mouth See admin instructions. Alternate taking 75 mcg and 37.5 mcg once daily 12/19/17  Yes [name], MD  Lidocaine-Menthol (ICY HOT LIDOCAINE PLUS MENTHOL) 4-1 % PTCH Apply 1 patch topically daily as needed (pain).   Yes [provider]  loperamide (IMODIUM A-D) 2 MG tablet Take 2-4 mg by mouth as needed for diarrhea or loose stools.   Yes [provider]  meloxicam (MOBIC) 7.5 MG tablet Take 1 tablet (7.5 mg total) by mouth daily. 12/19/17  Yes [name], MD  montelukast (SINGULAIR) 10 MG tablet Take 1 tablet (10 mg total) by mouth at bedtime. 12/05/17  Yes [name], MD  pantoprazole (PROTONIX) 40 MG tablet Take 1 tablet (40 mg total) by mouth 2 (two) times daily. 12/19/17  Yes [name], MD  potassium chloride (K-DUR) 10 MEQ tablet Take 1 tablet (10 mEq total) by mouth daily. 12/19/17  Yes [name], MD  pravastatin (PRAVACHOL) 40 MG tablet Take 1 tablet (40 mg total) by mouth daily. 12/19/17  Yes [name], MD  traMADol (ULTRAM) 50 MG tablet Take 1 tablet (50 mg total) by mouth 2 (two) times daily. 09/14/17  Yes [name], MD  DULoxetine (CYMBALTA) 60 MG capsule Take 1 capsule (60 mg total) by mouth daily. Patient not taking: Reported on 01/30/2018 12/19/17   [name], MD    Allergies as of 01/30/2018 - Review Complete 01/30/2018  Allergen Reaction Noted  . Aspirin Other (See Comments) 05/19/2015    Family History  Problem Relation Age of Onset  . Stroke Father   . Aneurysm Father   . Coronary artery disease Brother   . Hypertension Brother   . Down syndrome Brother   . Hypertension Brother   . Hypertension Brother        father/mother  . Aneurysm Mother   . Throat cancer Brother 49       throat cancer  . Hypertension Sister   . Diabetes Brother   .  Diabetes Brother   . Anesthesia problems Neg Hx   . Hypotension Neg Hx   . Malignant hyperthermia Neg Hx   . Pseudochol deficiency Neg Hx   . Colon cancer Neg Hx     Social History   Socioeconomic History  . Marital status: Married    Spouse name: Not on file  . Number of children: 2  . Years of education: Not on file  . Highest education level: Not on file  Occupational History  . Occupation: disabled    Fish farm manager: NOT EMPLOYED  Social Needs  . Financial resource strain: Very hard  . Food insecurity:    Worry: Sometimes true    Inability: Sometimes true  . Transportation needs:    Medical: No    Non-medical: No  Tobacco Use  . Smoking status: Current Every Day Smoker    Packs/day: 0.50    Years: 45.00    Pack years: 22.50    Types: Cigarettes    Start  date: 07/14/1971  . Smokeless tobacco: Never Used  . Tobacco comment: 0.5 PPD current plans to reduce   Substance and Sexual Activity  . Alcohol use: No    Alcohol/week: 0.0 standard drinks    Comment: quit June 2012-used to drink 6 pack daily  . Drug use: No  . Sexual activity: Yes    Birth control/protection: Post-menopausal  Lifestyle  . Physical activity:    Days per week: 2 days    Minutes per session: 20 min  . Stress: Not at all  Relationships  . Social connections:    Talks on phone: Never    Gets together: More than three times a week    Attends religious service: More than 4 times per year    Active member of club or organization: No    Attends meetings of clubs or organizations: Never    Relationship status: Married  . Intimate partner violence:    Fear of current or ex partner: No    Emotionally abused: No    Physically abused: No    Forced sexual activity: No  Other Topics Concern  . Not on file  Social History Narrative  . Not on file    Review of Systems: See HPI, otherwise negative ROS  Physical Exam: BP (!) 142/87   Pulse (!) 104   Temp 97.8 F (36.6 C) (Oral)   Ht 5\' 8"  (1.727  m)   Wt 151 lb (68.5 kg)   BMI 22.96 kg/m  General:   Alert,  Well-developed, well-nourished, pleasant and cooperative in NAD Extremities:  Without clubbing or edema.  Impression/Plan: Very pleasant 63 year old lady longstanding GERD well-controlled with daily Protonix. Altered bowel function with occasional incontinence consistent with irritable bowel syndrome-diarrhea and likely poor anorectal sphincteric mechanisms. Sounds as though she is doing very well on Imodium.  She probably needs to simply take an Imodium each morning and use additional doses sparingly.  History of colonic adenoma; due for surveillance examination 2021.  Recommendations:  Take protonix 40 mg daily  Take an Imodium each morning - may take an extra dose daily as needed  OV 6 months  Plan for colonoscopy 2021  Try Melatonin (over the counter) to help with sleep  Smoking cessation also discussed with the patient at some length.   Notice: This dictation was prepared with Dragon dictation along with smaller phrase technology. Any transcriptional errors that result from this process are unintentional and may not be corrected upon review.

## 2018-01-30 NOTE — Addendum Note (Signed)
Addended by: Annie Paras D on: 01/30/2018 08:59 AM   Modules accepted: Orders

## 2018-01-30 NOTE — Patient Instructions (Signed)
Take protonix 40 mg daily  Take an Imodium each morning - may take an extra dose daily as needed  OV 6 months  Plan for colonoscopy 2021  Try Melatonin (over the counter) to help with sleep

## 2018-03-28 ENCOUNTER — Emergency Department (HOSPITAL_COMMUNITY): Payer: Medicare Other

## 2018-03-28 ENCOUNTER — Other Ambulatory Visit: Payer: Self-pay | Admitting: Family Medicine

## 2018-03-28 ENCOUNTER — Observation Stay (HOSPITAL_COMMUNITY)
Admission: EM | Admit: 2018-03-28 | Discharge: 2018-03-29 | Disposition: A | Discharge status: Home / Self Care | Payer: Medicare Other | Attending: Family Medicine | Admitting: Family Medicine

## 2018-03-28 ENCOUNTER — Other Ambulatory Visit: Payer: Self-pay

## 2018-03-28 ENCOUNTER — Encounter (HOSPITAL_COMMUNITY): Payer: Self-pay | Admitting: Emergency Medicine

## 2018-03-28 DIAGNOSIS — Z79890 Hormone replacement therapy (postmenopausal): Secondary | ICD-10-CM | POA: Diagnosis not present

## 2018-03-28 DIAGNOSIS — F419 Anxiety disorder, unspecified: Secondary | ICD-10-CM | POA: Diagnosis not present

## 2018-03-28 DIAGNOSIS — Z791 Long term (current) use of non-steroidal anti-inflammatories (NSAID): Secondary | ICD-10-CM | POA: Insufficient documentation

## 2018-03-28 DIAGNOSIS — E039 Hypothyroidism, unspecified: Secondary | ICD-10-CM | POA: Insufficient documentation

## 2018-03-28 DIAGNOSIS — K219 Gastro-esophageal reflux disease without esophagitis: Secondary | ICD-10-CM | POA: Diagnosis not present

## 2018-03-28 DIAGNOSIS — E785 Hyperlipidemia, unspecified: Secondary | ICD-10-CM | POA: Insufficient documentation

## 2018-03-28 DIAGNOSIS — K029 Dental caries, unspecified: Secondary | ICD-10-CM | POA: Diagnosis not present

## 2018-03-28 DIAGNOSIS — E876 Hypokalemia: Principal | ICD-10-CM | POA: Diagnosis present

## 2018-03-28 DIAGNOSIS — R109 Unspecified abdominal pain: Secondary | ICD-10-CM | POA: Diagnosis not present

## 2018-03-28 DIAGNOSIS — K047 Periapical abscess without sinus: Secondary | ICD-10-CM | POA: Diagnosis not present

## 2018-03-28 DIAGNOSIS — F329 Major depressive disorder, single episode, unspecified: Secondary | ICD-10-CM | POA: Insufficient documentation

## 2018-03-28 DIAGNOSIS — Z79899 Other long term (current) drug therapy: Secondary | ICD-10-CM | POA: Diagnosis not present

## 2018-03-28 DIAGNOSIS — I2510 Atherosclerotic heart disease of native coronary artery without angina pectoris: Secondary | ICD-10-CM | POA: Insufficient documentation

## 2018-03-28 DIAGNOSIS — M549 Dorsalgia, unspecified: Secondary | ICD-10-CM | POA: Diagnosis not present

## 2018-03-28 DIAGNOSIS — G8929 Other chronic pain: Secondary | ICD-10-CM | POA: Insufficient documentation

## 2018-03-28 DIAGNOSIS — K828 Other specified diseases of gallbladder: Secondary | ICD-10-CM | POA: Diagnosis not present

## 2018-03-28 DIAGNOSIS — J449 Chronic obstructive pulmonary disease, unspecified: Secondary | ICD-10-CM | POA: Diagnosis not present

## 2018-03-28 DIAGNOSIS — I10 Essential (primary) hypertension: Secondary | ICD-10-CM | POA: Diagnosis present

## 2018-03-28 DIAGNOSIS — F17210 Nicotine dependence, cigarettes, uncomplicated: Secondary | ICD-10-CM | POA: Diagnosis not present

## 2018-03-28 DIAGNOSIS — J209 Acute bronchitis, unspecified: Secondary | ICD-10-CM | POA: Diagnosis present

## 2018-03-28 DIAGNOSIS — D509 Iron deficiency anemia, unspecified: Secondary | ICD-10-CM | POA: Diagnosis present

## 2018-03-28 DIAGNOSIS — Z8249 Family history of ischemic heart disease and other diseases of the circulatory system: Secondary | ICD-10-CM | POA: Diagnosis not present

## 2018-03-28 DIAGNOSIS — Z886 Allergy status to analgesic agent status: Secondary | ICD-10-CM | POA: Insufficient documentation

## 2018-03-28 DIAGNOSIS — F17200 Nicotine dependence, unspecified, uncomplicated: Secondary | ICD-10-CM | POA: Diagnosis present

## 2018-03-28 DIAGNOSIS — E860 Dehydration: Secondary | ICD-10-CM | POA: Diagnosis not present

## 2018-03-28 DIAGNOSIS — R05 Cough: Secondary | ICD-10-CM | POA: Diagnosis not present

## 2018-03-28 LAB — BASIC METABOLIC PANEL
ANION GAP: 16 — AB (ref 5–15)
BUN: 12 mg/dL (ref 8–23)
CALCIUM: 8.8 mg/dL — AB (ref 8.9–10.3)
CHLORIDE: 92 mmol/L — AB (ref 98–111)
CO2: 27 mmol/L (ref 22–32)
CREATININE: 1.38 mg/dL — AB (ref 0.44–1.00)
GFR calc non Af Amer: 41 mL/min — ABNORMAL LOW (ref >60)
GFR, EST AFRICAN AMERICAN: 47 mL/min — AB (ref >60)
GLUCOSE: 109 mg/dL — AB (ref 70–99)
Potassium: 2.4 mmol/L — CL (ref 3.5–5.1)
Sodium: 135 mmol/L (ref 135–145)

## 2018-03-28 LAB — MAGNESIUM: MAGNESIUM: 0.6 mg/dL — AB (ref 1.7–2.4)

## 2018-03-28 LAB — HEPATIC FUNCTION PANEL
ALBUMIN: 5.1 g/dL — AB (ref 3.5–5.0)
ALK PHOS: 69 U/L (ref 38–126)
ALT: 13 U/L (ref 0–44)
AST: 27 U/L (ref 15–41)
BILIRUBIN DIRECT: 0.2 mg/dL (ref 0.0–0.2)
BILIRUBIN INDIRECT: 1 mg/dL — AB (ref 0.3–0.9)
BILIRUBIN TOTAL: 1.2 mg/dL (ref 0.3–1.2)
Total Protein: 8.7 g/dL — ABNORMAL HIGH (ref 6.5–8.1)

## 2018-03-28 LAB — CBC WITH DIFFERENTIAL/PLATELET
Abs Immature Granulocytes: 0.1 10*3/uL — ABNORMAL HIGH (ref 0.00–0.07)
BASOS ABS: 0 10*3/uL (ref 0.0–0.1)
Basophils Relative: 0 %
EOS ABS: 0 10*3/uL (ref 0.0–0.5)
EOS PCT: 0 %
HEMATOCRIT: 41.3 % (ref 36.0–46.0)
HEMOGLOBIN: 13.7 g/dL (ref 12.0–15.0)
Immature Granulocytes: 1 %
LYMPHS ABS: 4.6 10*3/uL — AB (ref 0.7–4.0)
LYMPHS PCT: 25 %
MCH: 26.6 pg (ref 26.0–34.0)
MCHC: 33.2 g/dL (ref 30.0–36.0)
MCV: 80.2 fL (ref 80.0–100.0)
MONOS PCT: 9 %
Monocytes Absolute: 1.6 10*3/uL — ABNORMAL HIGH (ref 0.1–1.0)
Neutro Abs: 11.9 10*3/uL — ABNORMAL HIGH (ref 1.7–7.7)
Neutrophils Relative %: 65 %
Platelets: 455 10*3/uL — ABNORMAL HIGH (ref 150–400)
RBC: 5.15 MIL/uL — ABNORMAL HIGH (ref 3.87–5.11)
RDW: 14.6 % (ref 11.5–15.5)
WBC: 18.3 10*3/uL — ABNORMAL HIGH (ref 4.0–10.5)
nRBC: 0 % (ref 0.0–0.2)

## 2018-03-28 LAB — LIPASE, BLOOD: LIPASE: 22 U/L (ref 11–51)

## 2018-03-28 LAB — LACTIC ACID, PLASMA: Lactic Acid, Venous: 2.7 mmol/L (ref 0.5–1.9)

## 2018-03-28 LAB — TROPONIN I: Troponin I: 0.03 ng/mL (ref <0.03)

## 2018-03-28 MED ORDER — POTASSIUM CHLORIDE 10 MEQ/100ML IV SOLN
10.0000 meq | INTRAVENOUS | Status: AC
  Administered 2018-03-28 (×2): 10 meq via INTRAVENOUS
  Filled 2018-03-28 (×2): qty 100

## 2018-03-28 MED ORDER — POTASSIUM CHLORIDE IN NACL 40-0.9 MEQ/L-% IV SOLN
INTRAVENOUS | Status: DC
  Administered 2018-03-29: 100 mL/h via INTRAVENOUS
  Filled 2018-03-28 (×3): qty 1000

## 2018-03-28 MED ORDER — POTASSIUM CHLORIDE ER 10 MEQ PO TBCR: 10.0000 meq | EXTENDED_RELEASE_TABLET | Freq: Two times a day (BID) | ORAL | Status: DC

## 2018-03-28 MED ORDER — LEVOTHYROXINE SODIUM 75 MCG PO TABS
37.5000 ug | ORAL_TABLET | ORAL | Status: DC
  Administered 2018-03-29: 37.5 ug via ORAL
  Filled 2018-03-28: qty 1

## 2018-03-28 MED ORDER — ACETAMINOPHEN ER 650 MG PO TBCR
1300.0000 mg | EXTENDED_RELEASE_TABLET | Freq: Two times a day (BID) | ORAL | Status: DC
  Filled 2018-03-28 (×3): qty 2

## 2018-03-28 MED ORDER — SODIUM CHLORIDE 0.9 % IV BOLUS
1000.0000 mL | Freq: Once | INTRAVENOUS | Status: AC
  Administered 2018-03-28: 1000 mL via INTRAVENOUS

## 2018-03-28 MED ORDER — SODIUM CHLORIDE 0.9 % IV SOLN
1.0000 g | INTRAVENOUS | Status: DC
  Administered 2018-03-29: 1 g via INTRAVENOUS
  Filled 2018-03-28: qty 10

## 2018-03-28 MED ORDER — PRAVASTATIN SODIUM 40 MG PO TABS
40.0000 mg | ORAL_TABLET | Freq: Every day | ORAL | Status: DC
  Administered 2018-03-29: 40 mg via ORAL
  Filled 2018-03-28: qty 1

## 2018-03-28 MED ORDER — VITAMIN D 25 MCG (1000 UNIT) PO TABS
1000.0000 [IU] | ORAL_TABLET | Freq: Every day | ORAL | Status: DC
  Administered 2018-03-29: 1000 [IU] via ORAL
  Filled 2018-03-28: qty 1

## 2018-03-28 MED ORDER — ONDANSETRON HCL 4 MG/2ML IJ SOLN: 4.0000 mg | Freq: Four times a day (QID) | INTRAMUSCULAR | Status: DC | PRN

## 2018-03-28 MED ORDER — LIDOCAINE-MENTHOL 4-1 % EX PTCH
1.0000 | MEDICATED_PATCH | Freq: Every day | CUTANEOUS | Status: DC | PRN
  Filled 2018-03-28: qty 1

## 2018-03-28 MED ORDER — BUSPIRONE HCL 5 MG PO TABS
10.0000 mg | ORAL_TABLET | Freq: Three times a day (TID) | ORAL | Status: DC
  Administered 2018-03-29 (×2): 10 mg via ORAL
  Filled 2018-03-28 (×3): qty 2

## 2018-03-28 MED ORDER — POTASSIUM CHLORIDE 10 MEQ/100ML IV SOLN
10.0000 meq | INTRAVENOUS | Status: AC
  Administered 2018-03-29 (×3): 10 meq via INTRAVENOUS
  Filled 2018-03-28 (×2): qty 100

## 2018-03-28 MED ORDER — MAGNESIUM SULFATE 2 GM/50ML IV SOLN
2.0000 g | Freq: Once | INTRAVENOUS | Status: AC
  Administered 2018-03-28: 2 g via INTRAVENOUS
  Filled 2018-03-28: qty 50

## 2018-03-28 MED ORDER — GABAPENTIN 300 MG PO CAPS
600.0000 mg | ORAL_CAPSULE | Freq: Two times a day (BID) | ORAL | Status: DC
  Administered 2018-03-29 (×2): 600 mg via ORAL
  Filled 2018-03-28 (×3): qty 2

## 2018-03-28 MED ORDER — LOPERAMIDE HCL 2 MG PO CAPS: 2.0000 mg | ORAL_CAPSULE | ORAL | Status: DC | PRN

## 2018-03-28 MED ORDER — DULOXETINE HCL 60 MG PO CPEP
60.0000 mg | ORAL_CAPSULE | Freq: Every day | ORAL | Status: DC
  Administered 2018-03-29: 60 mg via ORAL
  Filled 2018-03-28: qty 2
  Filled 2018-03-28: qty 1

## 2018-03-28 MED ORDER — MAGNESIUM SULFATE 2 GM/50ML IV SOLN
2.0000 g | Freq: Once | INTRAVENOUS | Status: AC
  Administered 2018-03-29: 2 g via INTRAVENOUS
  Filled 2018-03-28: qty 50

## 2018-03-28 MED ORDER — SODIUM CHLORIDE 0.9 % IV SOLN
INTRAVENOUS | Status: DC | PRN
  Administered 2018-03-28: 22:00:00 via INTRAVENOUS

## 2018-03-28 MED ORDER — ONDANSETRON HCL 4 MG PO TABS: 4.0000 mg | ORAL_TABLET | Freq: Four times a day (QID) | ORAL | Status: DC | PRN

## 2018-03-28 MED ORDER — SODIUM CHLORIDE 0.9 % IV SOLN
INTRAVENOUS | Status: DC
  Administered 2018-03-28: 22:00:00 via INTRAVENOUS

## 2018-03-28 MED ORDER — PANTOPRAZOLE SODIUM 40 MG PO TBEC
40.0000 mg | DELAYED_RELEASE_TABLET | Freq: Two times a day (BID) | ORAL | Status: DC
  Administered 2018-03-29 (×2): 40 mg via ORAL
  Filled 2018-03-28 (×2): qty 1

## 2018-03-28 NOTE — ED Provider Notes (Signed)
[facility] EMERGENCY DEPARTMENT Provider Note   CSN: [id number] Arrival date & time: 03/28/18  1854    History   Chief Complaint Chief Complaint  Patient presents with   Abdominal Pain    HPI Kelly Douglas is a 63 y.o. female.     HPI  Pt was seen at 2050.   Per pt, c/o gradual onset and persistence of constant generalized abd "pain" for the past 7 days.  Has been associated with nausea, multiple intermittent episodes of diarrhea, and poor PO intake.  Describes the abd pain as "aching."  Denies vomiting, no fevers, no back pain, no rash, no CP/SOB, no black or blood in stools.      Past Medical History:  Diagnosis Date   Anxiety    Arteriosclerotic cardiovascular disease (ASCVD) 2013   coronary calcification-left main, LAD and CX; small pericardial effusion   Arthritis    Chronic back pain    Chronic bronchitis    Chronic lung disease    Chronic scarring and volume loss-left lung; characteristics of a chronic infectious process-possible MAI   Depression    Dyspnea    GERD (gastroesophageal reflux disease)    Dr Gala Romney EGD 09/2009->esophagitis, sm HH, antral erosions, atonic esophagus   Hyperlipidemia    Hypertension    Hypothyroid 1981 approx   Scleroderma ([place])    Seasonal allergies    Syncope    Multiple spells over the past 40+ years, likely neurocardiogenic   Tobacco abuse 06/24/2009    Patient Active Problem List   Diagnosis Date Noted   Hypokalemia 03/28/2018   Hypomagnesemia 03/28/2018   Dehydration 03/28/2018   Acute cystitis without hematuria 12/23/2017   Streptococcus viridans infection 10/26/2017   Dental caries 10/26/2017   S/P bronchoscopy 10/18/2017   Pulmonary nodules 11/18/2016   Pollen allergies 05/19/2015   Annual physical exam 11/11/2013   Nicotine dependence 09/17/2013   Cervical pain (neck) 06/21/2013   Back pain with radiation 06/21/2013   IDA (iron deficiency anemia) 08/10/2012   Essential  hypertension 02/22/2012   Cardiorenal syndrome 02/02/2012   Insomnia 05/09/2011   GERD (gastroesophageal reflux disease)    Scleroderma ([facility])    COPD (chronic obstructive pulmonary disease) ([place])    Hypothyroid    Arteriosclerotic cardiovascular disease (ASCVD)    Syncope    Anxiety and depression 06/24/2009   Vitamin D deficiency 03/18/2009   Hyperlipidemia 03/18/2009    Past Surgical History:  Procedure Laterality Date   BILATERAL SALPINGOOPHORECTOMY  2013    Dr. Elonda Husky; uterus remains in situ   COLONOSCOPY  09/28/09   anal papilla otherwise normal   COLONOSCOPY N/A 08/29/2012   CNO:[id number] polyp-removed as described above. tubular adenoma   ESOPHAGOGASTRODUODENOSCOPY  10/07/09   Dr. Barrie Dunker of esophageal mucosa, diffusely ?esophagitis (bx benign), small HH/antral erosions, erythema bx benign. 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 Hiatal hernia   FUDUCIAL PLACEMENT  10/18/2017   Procedure: PLACEMENT OF 3 FUDUCIAL INTO LEFT LOWER LOBE OF LUNG-TARGET 1;  Surgeon: Collene Gobble, MD;  Location: [facility];  Service: Thoracic;;   LAPAROSCOPIC APPENDECTOMY  07/16/2011   Procedure: APPENDECTOMY LAPAROSCOPIC;  Surgeon: Donato Heinz, MD;  Location: [facility];  Service: General;  Laterality: N/A;   TUBAL LIGATION     VIDEO BRONCHOSCOPY  10/18/2017   VIDEO BRONCHOSCOPY WITH ENDOBRONCHIAL NAVIGATION N/A 10/18/2017   Procedure: VIDEO BRONCHOSCOPY WITH ENDOBRONCHIAL NAVIGATION;  Surgeon: Collene Gobble, MD;  [facility] OR;  Service: Thoracic;  Laterality: N/A;     OB History    Gravida  2   Para  2   Term  2   Preterm      AB      Living  2     SAB      TAB      Ectopic      Multiple      Live Births               Home Medications    Prior to Admission  medications   Medication Sig Start Date End Date Taking? Authorizing Provider  acetaminophen (TYLENOL) 650 MG CR tablet Take 1,300 mg by mouth 2 (two) times daily.    Yes [provider]  amLODipine (NORVASC) 5 MG tablet Take 1 tablet (5 mg total) by mouth daily. 12/19/17  Yes [name], MD  busPIRone (BUSPAR) 10 MG tablet Take 1 tablet (10 mg total) by mouth 3 (three) times daily. 12/05/17  Yes [name], MD  cholecalciferol (VITAMIN D) 1000 units tablet Take 1,000 Units by mouth daily.   Yes [provider]  DULoxetine (CYMBALTA) 60 MG capsule Take 1 capsule (60 mg total) by mouth daily. 12/19/17  Yes [name], MD  gabapentin (NEURONTIN) 600 MG tablet Take 1 tablet (600 mg total) by mouth 2 (two) times daily. 10/18/17  Yes Collene Gobble, MD  levothyroxine (SYNTHROID, LEVOTHROID) 75 MCG tablet Take 0.5-1 tablets (37.5-75 mcg total) by mouth See admin instructions. Alternate taking 75 mcg and 37.5 mcg once daily 12/19/17  Yes [name], MD  Lidocaine-Menthol (ICY HOT LIDOCAINE PLUS MENTHOL) 4-1 % PTCH Apply 1 patch topically daily as needed (pain).   Yes [provider]  loperamide (IMODIUM A-D) 2 MG tablet Take 2-4 mg by mouth as needed for diarrhea or loose stools.   Yes [provider]  meloxicam (MOBIC) 7.5 MG tablet Take 1 tablet (7.5 mg total) by mouth daily. 12/19/17  Yes [name], MD  pantoprazole (PROTONIX) 40 MG tablet Take 1 tablet (40 mg total) by mouth 2 (two) times daily. 12/19/17  Yes [name], MD  potassium chloride (K-DUR) 10 MEQ tablet Take 1 tablet (10 mEq total) by mouth daily. 12/19/17  Yes [name], MD  pravastatin (PRAVACHOL) 40 MG tablet Take 1 tablet (40 mg total) by mouth daily. 12/19/17  Yes [name], MD  traMADol (ULTRAM) 50 MG tablet Take 1 tablet (50 mg total) by mouth 2 (two) times daily. 09/14/17  Yes [name], MD    Family History Family  History  Problem Relation Age of Onset   Stroke Father    Aneurysm Father    Coronary artery disease Brother    Hypertension Brother    Down syndrome Brother    Hypertension Brother    Hypertension Brother        father/mother   Aneurysm Mother    Throat cancer Brother 43       throat cancer   Hypertension Sister    Diabetes Brother    Diabetes Brother    Anesthesia problems Neg Hx    Hypotension Neg Hx    Malignant hyperthermia Neg Hx    Pseudochol deficiency Neg Hx    Colon cancer Neg Hx     Social History Social History   Tobacco Use   Smoking status: Current Every Day  Smoker    Packs/day: 0.50    Years: 45.00    Pack years: 22.50    Types: Cigarettes    Start date: 07/14/1971   Smokeless tobacco: Never Used   Tobacco comment: 0.5 PPD current plans to reduce   Substance Use Topics   Alcohol use: No    Alcohol/week: 0.0 standard drinks    Comment: quit June 2012-used to drink 6 pack daily   Drug use: No     Allergies   Aspirin   Review of Systems Review of Systems ROS: Statement: All systems negative except as marked or noted in the HPI; Constitutional: Negative for fever and chills. +lightheadedness.  ; ; Eyes: Negative for eye pain, redness and discharge. ; ; ENMT: Negative for ear pain, hoarseness, nasal congestion, sinus pressure and sore throat. ; ; Cardiovascular: Negative for chest pain, palpitations, diaphoresis, dyspnea and peripheral edema. ; ; Respiratory: Negative for cough, wheezing and stridor. ; ; Gastrointestinal: +decreased PO intake, nausea, diarrhea, abd pain. Negative for vomiting, blood in stool, hematemesis, jaundice and rectal bleeding. . ; ; Genitourinary: Negative for dysuria, flank pain and hematuria. ; ; Musculoskeletal: Negative for back pain and neck pain. Negative for swelling and trauma.; ; Skin: Negative for pruritus, rash, abrasions, blisters, bruising and skin lesion.; ; Neuro: Negative for headache and neck  stiffness. Negative for weakness, altered level of consciousness, altered mental status, extremity weakness, paresthesias, involuntary movement, seizure and syncope.       Physical Exam Updated Vital Signs BP 114/72    Pulse 95    Temp 97.9 F (36.6 C) (Oral)    Resp (!) 23    Ht 5\' 9"  (1.753 m)    Wt 68 kg    SpO2 99%    BMI 22.15 kg/m    Patient Vitals for the past 24 hrs:  BP Temp Temp src Pulse Resp SpO2 Height Weight  03/28/18 2100 114/72 -- -- 95 (!) 23 99 % -- --  03/28/18 1942 -- -- -- -- -- -- 5\' 9"  (1.753 m) 68 kg  03/28/18 1941 95/66 97.9 F (36.6 C) Oral (!) 134 16 98 % -- --   22:22 Orthostatic Vital Signs LN  Orthostatic Lying   BP- Lying: 104/61  Pulse- Lying: 84      Orthostatic Sitting  BP- Sitting: 103/79  Pulse- Sitting: 87      Orthostatic Standing at 0 minutes  BP- Standing at 0 minutes: 100/86  Pulse- Standing at 0 minutes: 93     Physical Exam 2055: Physical examination:  Nursing notes reviewed; Vital signs and O2 SAT reviewed;  Constitutional: Well developed, Well nourished, In no acute distress; Head:  Normocephalic, atraumatic; Eyes: EOMI, PERRL, No scleral icterus; ENMT: Mouth and pharynx normal, Mucous membranes dry and cracked; Neck: Supple, Full range of motion, No lymphadenopathy; Cardiovascular: Regular rate and rhythm, No gallop; Respiratory: Breath sounds clear & equal bilaterally, No wheezes.  Speaking full sentences with ease, Normal respiratory effort/excursion; Chest: Nontender, Movement normal; Abdomen: Soft, +diffuse tenderness to palp. No rebound or guarding. Nondistended, Normal bowel sounds; Genitourinary: No CVA tenderness; Extremities: Peripheral pulses normal, No tenderness, No edema, No calf edema or asymmetry.; Neuro: AA&Ox3, Major CN grossly intact.  Speech clear. No gross focal motor or sensory deficits in extremities.; Skin: Color normal, Warm, Dry.   ED Treatments / Results  Labs (all labs ordered are listed, but only  abnormal results are displayed)   EKG EKG Interpretation  Date/Time:  Wednesday March 28 2018  21:13:10 EDT Ventricular Rate:  97 PR Interval:    QRS Duration: 105 QT Interval:  371 QTC Calculation: 472 R Axis:   64 Text Interpretation:  Sinus rhythm Repol abnrm suggests ischemia, diffuse leads Baseline wander When compared with ECG of 10/18/2017 Nonspecific ST and T wave abnormality Inferior leads is now Present Nonspecific ST and T wave abnormality Anterolateral leads is more pronounced Confirmed by Francine Graven [phone]) on 03/28/2018 9:58:18 PM   Radiology   Procedures Procedures (including critical care time)  Medications Ordered in ED Medications  0.9 %  sodium chloride infusion ( Intravenous Stopped 03/28/18 2258)  potassium chloride 10 mEq in 100 mL IVPB (10 mEq Intravenous New Bag/Given 03/28/18 2301)  0.9 %  sodium chloride infusion ( Intravenous New Bag/Given 03/28/18 2155)  sodium chloride 0.9 % bolus 1,000 mL (1,000 mLs Intravenous New Bag/Given 03/28/18 2156)  magnesium sulfate IVPB 2 g 50 mL ( Intravenous Stopped 03/28/18 2256)  sodium chloride 0.9 % bolus 1,000 mL (1,000 mLs Intravenous New Bag/Given 03/28/18 2301)     Initial Impression / Assessment and Plan / ED Course  I have reviewed the triage vital signs and the nursing notes.  Pertinent labs & imaging results that were available during my care of the patient were reviewed by me and considered in my medical decision making (see chart for details).     MDM Reviewed: previous chart, nursing note and vitals Reviewed previous: labs and ECG Interpretation: labs, ECG, x-ray and CT scan Total time providing critical care: 30-74 minutes. This excludes time spent performing separately reportable procedures and services. Consults: admitting MD    CRITICAL CARE Performed by: Francine Graven Total critical care time: 35 minutes Critical care time was exclusive of separately billable procedures and treating  other patients. Critical care was necessary to treat or prevent imminent or life-threatening deterioration. Critical care was time spent personally by me on the following activities: development of treatment plan with patient and/or surrogate as well as nursing, discussions with consultants, evaluation of patient's response to treatment, examination of patient, obtaining history from patient or surrogate, ordering and performing treatments and interventions, ordering and review of laboratory studies, ordering and review of radiographic studies, pulse oximetry and re-evaluation of patient's condition.  Results for orders placed or performed during the hospital encounter of 03/28/18  CBC with Differential  Result Value Ref Range   WBC 18.3 (H) 4.0 - 10.5 K/uL   RBC 5.15 (H) 3.87 - 5.11 MIL/uL   Hemoglobin 13.7 12.0 - 15.0 g/dL   HCT 41.3 36.0 - 46.0 %   MCV 80.2 80.0 - 100.0 fL   MCH 26.6 26.0 - 34.0 pg   MCHC 33.2 30.0 - 36.0 g/dL   RDW 14.6 11.5 - 15.5 %   Platelets 455 (H) 150 - 400 K/uL   nRBC 0.0 0.0 - 0.2 %   Neutrophils Relative % 65 %   Neutro Abs 11.9 (H) 1.7 - 7.7 K/uL   Lymphocytes Relative 25 %   Lymphs Abs 4.6 (H) 0.7 - 4.0 K/uL   Monocytes Relative 9 %   Monocytes Absolute 1.6 (H) 0.1 - 1.0 K/uL   Eosinophils Relative 0 %   Eosinophils Absolute 0.0 0.0 - 0.5 K/uL   Basophils Relative 0 %   Basophils Absolute 0.0 0.0 - 0.1 K/uL   Immature Granulocytes 1 %   Abs Immature Granulocytes 0.10 (H) 0.00 - 0.07 K/uL  Basic metabolic panel  Result Value Ref Range   Sodium 135 135 -  145 mmol/L   Potassium 2.4 (LL) 3.5 - 5.1 mmol/L   Chloride 92 (L) 98 - 111 mmol/L   CO2 27 22 - 32 mmol/L   Glucose, Bld 109 (H) 70 - 99 mg/dL   BUN 12 8 - 23 mg/dL   Creatinine, Ser 1.38 (H) 0.44 - 1.00 mg/dL   Calcium 8.8 (L) 8.9 - 10.3 mg/dL   GFR calc non Af Amer 41 (L) >60 mL/min   GFR calc Af Amer 47 (L) >60 mL/min   Anion gap 16 (H) 5 - 15  Hepatic function panel  Result Value Ref Range    Total Protein 8.7 (H) 6.5 - 8.1 g/dL   Albumin 5.1 (H) 3.5 - 5.0 g/dL   AST 27 15 - 41 U/L   ALT 13 0 - 44 U/L   Alkaline Phosphatase 69 38 - 126 U/L   Total Bilirubin 1.2 0.3 - 1.2 mg/dL   Bilirubin, Direct 0.2 0.0 - 0.2 mg/dL   Indirect Bilirubin 1.0 (H) 0.3 - 0.9 mg/dL  Lipase, blood  Result Value Ref Range   Lipase 22 11 - 51 U/L  Magnesium  Result Value Ref Range   Magnesium 0.6 (LL) 1.7 - 2.4 mg/dL  Troponin I - Once  Result Value Ref Range   Troponin I 0.03 (HH) <0.03 ng/mL   Ct Abdomen Pelvis Wo Contrast Result Date: 03/28/2018 CLINICAL DATA:  Abdominal pain radiating to back EXAM: CT ABDOMEN AND PELVIS WITHOUT CONTRAST TECHNIQUE: Multidetector CT imaging of the abdomen and pelvis was performed following the standard protocol without IV contrast. COMPARISON:  PET-CT dated 11/17/2016 FINDINGS: Lower chest: Suspected fiducial marker in the left lung base (series 4/image 4). Lungs are otherwise clear. Hepatobiliary: Unenhanced liver is unremarkable. Distended gallbladder, without associated inflammatory changes. No intrahepatic or extrahepatic ductal dilatation. Pancreas: Within normal limits. Spleen: Within normal limits. Adrenals/Urinary Tract: Adrenal glands are within normal limits. Kidneys are within normal limits. No renal calculi or hydronephrosis. Bladder is underdistended. Stomach/Bowel: Stomach is within normal limits. Duodenal diverticulum in the right mid abdomen (series 2/image 35). No evidence of bowel obstruction. Prior appendectomy. Vascular/Lymphatic: No evidence of abdominal aortic aneurysm. Atherosclerotic calcifications of the abdominal aorta and branch vessels. No suspicious abdominopelvic lymphadenopathy. Reproductive: Uterus is unremarkable. No adnexal masses. Other: No abdominopelvic ascites. Musculoskeletal: Mild degenerative changes at L5-S1. IMPRESSION: No evidence of bowel obstruction.  Prior appendectomy. Distended gallbladder, without associated inflammatory  changes. No CT findings to account for the patient's abdominal pain. Electronically Signed   By: Julian Hy M.D.   On: 03/28/2018 21:50   Dg Chest 2 View Result Date: 03/28/2018 CLINICAL DATA:  63 year old female with cough and abdominal pain. Smoker. EXAM: CHEST - 2 VIEW COMPARISON:  Chest CT 01/25/2018 and earlier. FINDINGS: Chronic volume loss in the left hemithorax and leftward shift of the mediastinum. Stable cardiac size and mediastinal contours. Stable small surgical clips about the left hilum and diaphragm. Visualized tracheal air column is within normal limits. No pneumothorax, pulmonary edema, pleural effusion or acute pulmonary opacity. The chronic left apical lung opacity is better demonstrated by CT. No acute osseous abnormality identified. Negative visible bowel gas pattern. IMPRESSION: Stable, with chronic changes in the left hemithorax. No acute cardiopulmonary abnormality. Electronically Signed   By: Genevie Ann M.D.   On: 03/28/2018 21:44    2250:  IVF bolus given: continues orthostatic, no urine in bladder. 2nd IVF bolus given. IV magnesium and potassium ordered. CT scan otherwise reassuring.  Dx and testing d/w pt.  Questions answered.  Verb understanding, agreeable to admit.  T/C returned from [facility] Dr. Shanon Brow, case discussed, including:  HPI, pertinent PM/SHx, VS/PE, dx testing, ED course and treatment:  Agreeable to admit.    Final Clinical Impressions(s) / ED Diagnoses   Final diagnoses:  None    ED Discharge Orders    None       Francine Graven, DO 03/29/18 2222

## 2018-03-28 NOTE — ED Notes (Signed)
Date and time results received: 03/28/18 2135  Test: Trop/ Mag Critical Value: Trop 0.03/ Mag 0.6  Name of Provider Notified: Thurnell Garbe  Orders Received? Or Actions Taken?: orders placed

## 2018-03-28 NOTE — ED Notes (Signed)
Date and time results received: 03/28/18 8:41 PM  (use smartphrase ".now" to insert current time)  Test: potassium Critical Value:2.4  Name of Provider Notified: [email or web address]   Orders Received? Or Actions Taken?: .

## 2018-03-28 NOTE — ED Notes (Signed)
Attempted in and out cath with no urine return, MD [facility] notified.

## 2018-03-28 NOTE — ED Triage Notes (Signed)
Patient complaining of abdominal pain radiating into her back since yesterday. Also complains of urinary urgency.

## 2018-03-29 ENCOUNTER — Encounter (HOSPITAL_COMMUNITY): Payer: Self-pay | Admitting: Family Medicine

## 2018-03-29 DIAGNOSIS — K029 Dental caries, unspecified: Secondary | ICD-10-CM

## 2018-03-29 DIAGNOSIS — I10 Essential (primary) hypertension: Secondary | ICD-10-CM

## 2018-03-29 DIAGNOSIS — E876 Hypokalemia: Secondary | ICD-10-CM

## 2018-03-29 DIAGNOSIS — F17218 Nicotine dependence, cigarettes, with other nicotine-induced disorders: Secondary | ICD-10-CM

## 2018-03-29 DIAGNOSIS — E860 Dehydration: Secondary | ICD-10-CM

## 2018-03-29 DIAGNOSIS — J449 Chronic obstructive pulmonary disease, unspecified: Secondary | ICD-10-CM

## 2018-03-29 DIAGNOSIS — K047 Periapical abscess without sinus: Secondary | ICD-10-CM | POA: Diagnosis present

## 2018-03-29 DIAGNOSIS — K219 Gastro-esophageal reflux disease without esophagitis: Secondary | ICD-10-CM

## 2018-03-29 LAB — URINALYSIS, ROUTINE W REFLEX MICROSCOPIC
Bilirubin Urine: NEGATIVE
Glucose, UA: NEGATIVE mg/dL
Ketones, ur: 5 mg/dL — AB
Leukocytes,Ua: NEGATIVE
NITRITE: NEGATIVE
Protein, ur: NEGATIVE mg/dL
SPECIFIC GRAVITY, URINE: 1.006 (ref 1.005–1.030)
pH: 6 (ref 5.0–8.0)

## 2018-03-29 LAB — CBC
HCT: 33.7 % — ABNORMAL LOW (ref 36.0–46.0)
Hemoglobin: 11.4 g/dL — ABNORMAL LOW (ref 12.0–15.0)
MCH: 27 pg (ref 26.0–34.0)
MCHC: 33.8 g/dL (ref 30.0–36.0)
MCV: 79.7 fL — ABNORMAL LOW (ref 80.0–100.0)
Platelets: 331 10*3/uL (ref 150–400)
RBC: 4.23 MIL/uL (ref 3.87–5.11)
RDW: 14.7 % (ref 11.5–15.5)
WBC: 13.9 10*3/uL — ABNORMAL HIGH (ref 4.0–10.5)
nRBC: 0 % (ref 0.0–0.2)

## 2018-03-29 LAB — LACTIC ACID, PLASMA: Lactic Acid, Venous: 1.2 mmol/L (ref 0.5–1.9)

## 2018-03-29 LAB — BASIC METABOLIC PANEL
Anion gap: 11 (ref 5–15)
BUN: 12 mg/dL (ref 8–23)
CO2: 24 mmol/L (ref 22–32)
Calcium: 7.4 mg/dL — ABNORMAL LOW (ref 8.9–10.3)
Chloride: 101 mmol/L (ref 98–111)
Creatinine, Ser: 1.11 mg/dL — ABNORMAL HIGH (ref 0.44–1.00)
GFR calc Af Amer: >60 mL/min (ref >60)
GFR calc non Af Amer: 53 mL/min — ABNORMAL LOW (ref >60)
Glucose, Bld: 98 mg/dL (ref 70–99)
Potassium: 2.7 mmol/L — CL (ref 3.5–5.1)
Sodium: 136 mmol/L (ref 135–145)

## 2018-03-29 LAB — MRSA PCR SCREENING: MRSA by PCR: NEGATIVE

## 2018-03-29 LAB — MAGNESIUM: Magnesium: 2.1 mg/dL (ref 1.7–2.4)

## 2018-03-29 MED ORDER — LEVOTHYROXINE SODIUM 75 MCG PO TABS
75.0000 ug | ORAL_TABLET | ORAL | Status: DC
  Filled 2018-03-29: qty 1

## 2018-03-29 MED ORDER — POTASSIUM CHLORIDE 10 MEQ/100ML IV SOLN
10.0000 meq | INTRAVENOUS | Status: AC
  Administered 2018-03-29 (×5): 10 meq via INTRAVENOUS
  Filled 2018-03-29 (×3): qty 100

## 2018-03-29 MED ORDER — AMLODIPINE BESYLATE 5 MG PO TABS: 5.0000 mg | ORAL_TABLET | Freq: Every day | ORAL | 3 refills | Status: DC

## 2018-03-29 MED ORDER — AMOXICILLIN-POT CLAVULANATE 875-125 MG PO TABS: 1.0000 | ORAL_TABLET | Freq: Two times a day (BID) | ORAL | 0 refills | Status: AC

## 2018-03-29 MED ORDER — POTASSIUM CHLORIDE 10 MEQ/100ML IV SOLN
INTRAVENOUS | Status: AC
  Filled 2018-03-29: qty 100

## 2018-03-29 NOTE — Discharge Instructions (Signed)
Dehydration, Adult  Dehydration is when there is not enough fluid or water in your body. This happens when you lose more fluids than you take in. Dehydration can range from mild to very bad. It should be treated right away to keep it from getting very bad. Symptoms of mild dehydration may include:  Thirst.  Dry lips.  Slightly dry mouth.  Dry, warm skin.  Dizziness. Symptoms of moderate dehydration may include:  Very dry mouth.  Muscle cramps.  Dark pee (urine). Pee may be the color of tea.  Your body making less pee.  Your eyes making fewer tears.  Heartbeat that is uneven or faster than normal (palpitations).  Headache.  Light-headedness, especially when you stand up from sitting.  Fainting (syncope). Symptoms of very bad dehydration may include:  Changes in skin, such as: ? Cold and clammy skin. ? Blotchy (mottled) or pale skin. ? Skin that does not quickly return to normal after being lightly pinched and let go (poor skin turgor).  Changes in body fluids, such as: ? Feeling very thirsty. ? Your eyes making fewer tears. ? Not sweating when body temperature is high, such as in hot weather. ? Your body making very little pee.  Changes in vital signs, such as: ? Weak pulse. ? Pulse that is more than 100 beats a minute when you are sitting still. ? Fast breathing. ? Low blood pressure.  Other changes, such as: ? Sunken eyes. ? Cold hands and feet. ? Confusion. ? Lack of energy (lethargy). ? Trouble waking up from sleep. ? Short-term weight loss. ? Unconsciousness. Follow these instructions at home:   If told by your doctor, drink an ORS: ? Make an ORS by using instructions on the package. ? Start by drinking small amounts, about  cup (120 mL) every 5-10 minutes. ? Slowly drink more until you have had the amount that your doctor said to have.  Drink enough clear fluid to keep your pee clear or pale yellow. If you were told to drink an ORS, finish the  ORS first, then start slowly drinking clear fluids. Drink fluids such as: ? Water. Do not drink only water by itself. Doing that can make the salt (sodium) level in your body get too low (hyponatremia). ? Ice chips. ? Fruit juice that you have added water to (diluted). ? Low-calorie sports drinks.  Avoid: ? Alcohol. ? Drinks that have a lot of sugar. These include high-calorie sports drinks, fruit juice that does not have water added, and soda. ? Caffeine. ? Foods that are greasy or have a lot of fat or sugar.  Take over-the-counter and prescription medicines only as told by your doctor.  Do not take salt tablets. Doing that can make the salt level in your body get too high (hypernatremia).  Eat foods that have minerals (electrolytes). Examples include bananas, oranges, potatoes, tomatoes, and spinach.  Keep all follow-up visits as told by your doctor. This is important. Contact a doctor if:  You have belly (abdominal) pain that: ? Gets worse. ? Stays in one area (localizes).  You have a rash.  You have a stiff neck.  You get angry or annoyed more easily than normal (irritability).  You are more sleepy than normal.  You have a harder time waking up than normal.  You feel: ? Weak. ? Dizzy. ? Very thirsty.  You have peed (urinated) only a small amount of very dark pee during 6-8 hours. Get help right away if:  You have  symptoms of very bad dehydration.  You cannot drink fluids without throwing up (vomiting).  Your symptoms get worse with treatment.  You have a fever.  You have a very bad headache.  You are throwing up or having watery poop (diarrhea) and it: ? Gets worse. ? Does not go away.  You have blood or something green (bile) in your throw-up.  You have blood in your poop (stool). This may cause poop to look black and tarry.  You have not peed in 6-8 hours.  You pass out (faint).  Your heart rate when you are sitting still is more than 100 beats a  minute.  You have trouble breathing. This information is not intended to replace advice given to you by your health care provider. Make sure you discuss any questions you have with your health care provider. Document Released: 10/30/2008 Document Revised: 07/24/2015 Document Reviewed: 02/27/2015 Elsevier Interactive Patient Education  2019 [place].   Hypokalemia Hypokalemia means that the amount of potassium in the blood is lower than normal.Potassium is a chemical that helps regulate the amount of fluid in the body (electrolyte). It also stimulates muscle tightening (contraction) and helps nerves work properly.Normally, most of the bodys potassium is inside of cells, and only a very small amount is in the blood. Because the amount in the blood is so small, minor changes to potassium levels in the blood can be life-threatening. What are the causes? This condition may be caused by:  Antibiotic medicine.  Diarrhea or vomiting. Taking too much of a medicine that helps you have a bowel movement (laxative) can cause diarrhea and lead to hypokalemia.  Chronic kidney disease (CKD).  Medicines that help the body get rid of excess fluid (diuretics).  Eating disorders, such as bulimia.  Low magnesium levels in the body.  Sweating a lot. What are the signs or symptoms? Symptoms of this condition include:  Weakness.  Constipation.  Fatigue.  Muscle cramps.  Mental confusion.  Skipped heartbeats or irregular heartbeat (palpitations).  Tingling or numbness. How is this diagnosed? This condition is diagnosed with a blood test. How is this treated? Hypokalemia can be treated by taking potassium supplements by mouth or adjusting the medicines that you take. Treatment may also include eating more foods that contain a lot of potassium. If your potassium level is very low, you may need to get potassium through an IV tube in one of your veins and be monitored in the hospital. Follow  these instructions at home:   Take over-the-counter and prescription medicines only as told by your health care provider. This includes vitamins and supplements.  Eat a healthy diet. A healthy diet includes fresh fruits and vegetables, whole grains, healthy fats, and lean proteins.  If instructed, eat more foods that contain a lot of potassium, such as: ? Nuts, such as peanuts and pistachios. ? Seeds, such as sunflower seeds and pumpkin seeds. ? Peas, lentils, and lima beans. ? Whole grain and bran cereals and breads. ? Fresh fruits and vegetables, such as apricots, avocado, bananas, cantaloupe, kiwi, oranges, tomatoes, asparagus, and potatoes. ? Orange juice. ? Tomato juice. ? Red meats. ? Yogurt.  Keep all follow-up visits as told by your health care provider. This is important. Contact a health care provider if:  You have weakness that gets worse.  You feel your heart pounding or racing.  You vomit.  You have diarrhea.  You have diabetes (diabetes mellitus) and you have trouble keeping your blood sugar (glucose) in  your target range. Get help right away if:  You have chest pain.  You have shortness of breath.  You have vomiting or diarrhea that lasts for more than 2 days.  You faint. This information is not intended to replace advice given to you by your health care provider. Make sure you discuss any questions you have with your health care provider. Document Released: 01/03/2005 Document Revised: 08/22/2015 Document Reviewed: 08/22/2015 Elsevier Interactive Patient Education  2019 [employer].   Hypomagnesemia Hypomagnesemia is a condition in which the level of magnesium in the blood is low. Magnesium is a mineral that is found in many foods. It is used in many different processes in the body. Hypomagnesemia can affect every organ in the body. In severe cases, it can cause life-threatening problems. What are the causes? This condition may be caused by:  Not  getting enough magnesium in your diet.  Malnutrition.  Problems with absorbing magnesium from the intestines.  Dehydration.  Alcohol abuse.  Vomiting.  Severe or chronic diarrhea.  Some medicines, including medicines that make you urinate more (diuretics).  Certain diseases, such as kidney disease, diabetes, celiac disease, and overactive thyroid. What are the signs or symptoms? Symptoms of this condition include:  Loss of appetite.  Nausea and vomiting.  Involuntary shaking or trembling of a body part (tremor).  Muscle weakness.  Tingling in the arms and legs.  Sudden tightening of muscles (muscle spasms).  Confusion.  Psychiatric issues, such as depression, irritability, or psychosis.  A feeling of fluttering of the heart.  Seizures. These symptoms are more severe if magnesium levels drop suddenly. How is this diagnosed? This condition may be diagnosed based on:  Your symptoms and medical history.  A physical exam.  Blood and urine tests. How is this treated? Treatment depends on the cause and the severity of the condition. It may be treated with:  A magnesium supplement. This can be taken in pill form. If the condition is severe, magnesium is usually given through an IV.  Changes to your diet. You may be directed to eat foods that have a lot of magnesium, such as green leafy vegetables, peas, beans, and nuts.  Stopping any intake of alcohol. Follow these instructions at home:      Make sure that your diet includes foods with magnesium. Foods that have a lot of magnesium in them include: ? Green leafy vegetables, such as spinach and broccoli. ? Beans and peas. ? Nuts and seeds, such as almonds and sunflower seeds. ? Whole grains, such as whole grain bread and fortified cereals.  Take magnesium supplements if your health care provider tells you to do that. Take them as directed.  Take over-the-counter and prescription medicines only as told by  your health care provider.  Have your magnesium levels monitored as told by your health care provider.  When you are active, drink fluids that contain electrolytes.  Avoid drinking alcohol.  Keep all follow-up visits as told by your health care provider. This is important. Contact a health care provider if:  You get worse instead of better.  Your symptoms return. Get help right away if you:  Develop severe muscle weakness.  Have trouble breathing.  Feel that your heart is racing. Summary  Hypomagnesemia is a condition in which the level of magnesium in the blood is low.  Hypomagnesemia can affect every organ in the body.  Treatment may include eating more foods that contain magnesium, taking magnesium supplements, and not drinking alcohol.  Have your magnesium levels monitored as told by your health care provider. This information is not intended to replace advice given to you by your health care provider. Make sure you discuss any questions you have with your health care provider. Document Released: 09/29/2004 Document Revised: 12/05/2016 Document Reviewed: 12/05/2016 Elsevier Interactive Patient Education  2019 [employer].  Steps to Quit Smoking  Smoking tobacco can be harmful to your health and can affect almost every organ in your body. Smoking puts you, and those around you, at risk for developing many serious chronic diseases. Quitting smoking is difficult, but it is one of the best things that you can do for your health. It is never too late to quit. What are the benefits of quitting smoking? When you quit smoking, you lower your risk of developing serious diseases and conditions, such as:  Lung cancer or lung disease, such as COPD.  Heart disease.  Stroke.  Heart attack.  Infertility.  Osteoporosis and bone fractures. Additionally, symptoms such as coughing, wheezing, and shortness of breath may get better when you quit. You may also find that you get  sick less often because your body is stronger at fighting off colds and infections. If you are pregnant, quitting smoking can help to reduce your chances of having a baby of low birth weight. How do I get ready to quit? When you decide to quit smoking, create a plan to make sure that you are successful. Before you quit:  Pick a date to quit. Set a date within the next two weeks to give you time to prepare.  Write down the reasons why you are quitting. Keep this list in places where you will see it often, such as on your bathroom mirror or in your car or wallet.  Identify the people, places, things, and activities that make you want to smoke (triggers) and avoid them. Make sure to take these actions: ? Throw away all cigarettes at home, at work, and in your car. ? Throw away smoking accessories, such as Scientist, research (medical). ? Clean your car and make sure to empty the ashtray. ? Clean your home, including curtains and carpets.  Tell your family, friends, and coworkers that you are quitting. Support from your loved ones can make quitting easier.  Talk with your health care provider about your options for quitting smoking.  Find out what treatment options are covered by your health insurance. What strategies can I use to quit smoking? Talk with your healthcare provider about different strategies to quit smoking. Some strategies include:  Quitting smoking altogether instead of gradually lessening how much you smoke over a period of time. Research shows that quitting cold [place] is more successful than gradually quitting.  Attending in-person counseling to help you build problem-solving skills. You are more likely to have success in quitting if you attend several counseling sessions. Even short sessions of 10 minutes can be effective.  Finding resources and support systems that can help you to quit smoking and remain smoke-free after you quit. These resources are most helpful when you use  them often. They can include: ? Online chats with a Social worker. ? Telephone quitlines. ? Careers information officer. ? Support groups or group counseling. ? Text messaging programs. ? Mobile phone applications.  Taking medicines to help you quit smoking. (If you are pregnant or breastfeeding, talk with your health care provider first.) Some medicines contain nicotine and some do not. Both types of medicines help with cravings, but the medicines  that include nicotine help to relieve withdrawal symptoms. Your health care provider may recommend: ? Nicotine patches, gum, or lozenges. ? Nicotine inhalers or sprays. ? Non-nicotine medicine that is taken by mouth. Talk with your health care provider about combining strategies, such as taking medicines while you are also receiving in-person counseling. Using these two strategies together makes you more likely to succeed in quitting than if you used either strategy on its own. If you are pregnant or breastfeeding, talk with your health care provider about finding counseling or other support strategies to quit smoking. Do not take medicine to help you quit smoking unless told to do so by your health care provider. What things can I do to make it easier to quit? Quitting smoking might feel overwhelming at first, but there is a lot that you can do to make it easier. Take these important actions:  Reach out to your family and friends and ask that they support and encourage you during this time. Call telephone quitlines, reach out to support groups, or work with a counselor for support.  Ask people who smoke to avoid smoking around you.  Avoid places that trigger you to smoke, such as bars, parties, or smoke-break areas at work.  Spend time around people who do not smoke.  Lessen stress in your life, because stress can be a smoking trigger for some people. To lessen stress, try: ? Exercising regularly. ? Deep-breathing  exercises. ? Yoga. ? Meditating. ? Performing a body scan. This involves closing your eyes, scanning your body from head to toe, and noticing which parts of your body are particularly tense. Purposefully relax the muscles in those areas.  Download or purchase mobile phone or tablet apps (applications) that can help you stick to your quit plan by providing reminders, tips, and encouragement. There are many free apps, such as QuitGuide from the [employer] Office manager for Disease Control and Prevention). You can find other support for quitting smoking (smoking cessation) through smokefree.gov and other websites. How will I feel when I quit smoking? Within the first 24 hours of quitting smoking, you may start to feel some withdrawal symptoms. These symptoms are usually most noticeable 2-3 days after quitting, but they usually do not last beyond 2-3 weeks. Changes or symptoms that you might experience include:  Mood swings.  Restlessness, anxiety, or irritation.  Difficulty concentrating.  Dizziness.  Strong cravings for sugary foods in addition to nicotine.  Mild weight gain.  Constipation.  Nausea.  Coughing or a sore throat.  Changes in how your medicines work in your body.  A depressed mood.  Difficulty sleeping (insomnia). After the first 2-3 weeks of quitting, you may start to notice more positive results, such as:  Improved sense of smell and taste.  Decreased coughing and sore throat.  Slower heart rate.  Lower blood pressure.  Clearer skin.  The ability to breathe more easily.  Fewer sick days. Quitting smoking is very challenging for most people. Do not get discouraged if you are not successful the first time. Some people need to make many attempts to quit before they achieve long-term success. Do your best to stick to your quit plan, and talk with your health care provider if you have any questions or concerns. This information is not intended to replace advice given to  you by your health care provider. Make sure you discuss any questions you have with your health care provider. Document Released: 12/28/2000 Document Revised: 08/09/2016 Document Reviewed: 05/20/2014 Elsevier Interactive Patient  Education  2019 [employer].

## 2018-03-29 NOTE — Discharge Summary (Addendum)
Physician Discharge Summary  Kelly Douglas:[id number] DOB: 03/31/1955 DOA: 03/28/2018  PCP: [name], MD  Admit date: 03/28/2018 Discharge date: 03/29/2018  Admitted From:  Home  Disposition: Home   Recommendations for Outpatient Follow-up:  1. Follow up with PCP in 1 weeks 2. Follow with dentist as scheduled on 04/02/18 3. Please check BMP in 1 week to follow potassium  Discharge Condition: STABLE   CODE STATUS: FULL    Brief Hospitalization Summary: Please see all hospital notes, images, labs for full details of the hospitalization.  HPI: Kelly Douglas is a 63 y.o. female with medical history significant of anxiety, chronic back pain, hypertension comes in with over 5 days of abdominal pain which she is very vague about but seems to be lower with no associated urinary symptoms with no fevers.  She has been vomiting for several days also not able to hold a lot down.  She denies any diarrhea.  She denies blood in her vomit.  Patient is received IV fluids and medication in the ED which she feels much better.  Her potassium level and magnesium level were both noted to be low.  Urinalysis is pending.  Patient be referred for admission for dehydration.  The patient was admitted for hydration with IV fluids.  Her electrolytes were replaced as well.  She was given magnesium and she was given multiple runs of potassium.  She was also started on antibiotics.  She has severe dental caries and likely has a dental abscess she has an appointment to see a dentist on 04/02/2018.  She has been eating and drinking.  She feels much better and wants to discharge home.  She is given an additional 5 runs of potassium this morning and will discharge after that.  Follow-up with primary care provider.  She was given a prescription of Augmentin to take for the next 5 days until she can see her dentist.  She was strongly advised to stop smoking cigarettes and using tobacco products.  She was advised to  have her BMP rechecked and magnesium rechecked when she follows up with her primary care provider.  Discharge Diagnoses:  Principal Problem:   Hypokalemia Active Problems:   GERD (gastroesophageal reflux disease)   COPD (chronic obstructive pulmonary disease) (HCC)   Essential hypertension   IDA (iron deficiency anemia)   Nicotine dependence   Dental caries   Hypomagnesemia   Dehydration   Dental abscess   Discharge Instructions: Discharge Instructions    Call MD for:  difficulty breathing, headache or visual disturbances   Complete by:  As directed    Call MD for:  extreme fatigue   Complete by:  As directed    Call MD for:  persistant dizziness or light-headedness   Complete by:  As directed    Call MD for:  persistant nausea and vomiting   Complete by:  As directed    Call MD for:  severe uncontrolled pain   Complete by:  As directed    Increase activity slowly   Complete by:  As directed      Allergies as of 03/29/2018      Reactions   Aspirin Other (See Comments)   Nose bleeds      Medication List    TAKE these medications   acetaminophen 650 MG CR tablet Commonly known as:  TYLENOL Take 1,300 mg by mouth 2 (two) times daily.   amLODipine 5 MG tablet Commonly known as:  NORVASC Take 1 tablet (  5 mg total) by mouth daily. Start taking on:  March 31, 2018 What changed:  These instructions start on March 31, 2018. If you are unsure what to do until then, ask your doctor or other care provider.   amoxicillin-clavulanate 875-125 MG tablet Commonly known as:  Augmentin Take 1 tablet by mouth 2 (two) times daily for 5 days. Start taking on:  March 30, 2018   busPIRone 10 MG tablet Commonly known as:  BUSPAR Take 1 tablet (10 mg total) by mouth 3 (three) times daily.   cholecalciferol 1000 units tablet Commonly known as:  VITAMIN D Take 1,000 Units by mouth daily.   DULoxetine 60 MG capsule Commonly known as:  CYMBALTA Take 1 capsule (60 mg total) by  mouth daily.   gabapentin 600 MG tablet Commonly known as:  NEURONTIN Take 1 tablet (600 mg total) by mouth 2 (two) times daily.   Icy Hot Lidocaine Plus Menthol 4-1 % Ptch Generic drug:  Lidocaine-Menthol Apply 1 patch topically daily as needed (pain).   levothyroxine 75 MCG tablet Commonly known as:  SYNTHROID, LEVOTHROID Take 0.5-1 tablets (37.5-75 mcg total) by mouth See admin instructions. Alternate taking 75 mcg and 37.5 mcg once daily   loperamide 2 MG tablet Commonly known as:  IMODIUM A-D Take 2-4 mg by mouth as needed for diarrhea or loose stools.   meloxicam 7.5 MG tablet Commonly known as:  MOBIC Take 1 tablet (7.5 mg total) by mouth daily.   pantoprazole 40 MG tablet Commonly known as:  PROTONIX Take 1 tablet (40 mg total) by mouth 2 (two) times daily.   potassium chloride 10 MEQ tablet Commonly known as:  K-DUR Take 1 tablet (10 mEq total) by mouth daily.   pravastatin 40 MG tablet Commonly known as:  PRAVACHOL Take 1 tablet (40 mg total) by mouth daily.   traMADol 50 MG tablet Commonly known as:  ULTRAM Take 1 tablet (50 mg total) by mouth 2 (two) times daily.      Follow-up Information    [name], MD. Schedule an appointment as soon as possible for a visit in 1 week(s).   Specialty:  Family Medicine Contact information: [address] [place] [address] [phone]        Dentist Appointment. Go on 04/02/2018.   Why:  as already scheduled          Allergies  Allergen Reactions  . Aspirin Other (See Comments)    Nose bleeds   Allergies as of 03/29/2018      Reactions   Aspirin Other (See Comments)   Nose bleeds      Medication List    TAKE these medications   acetaminophen 650 MG CR tablet Commonly known as:  TYLENOL Take 1,300 mg by mouth 2 (two) times daily.   amLODipine 5 MG tablet Commonly known as:  NORVASC Take 1 tablet (5 mg total) by mouth daily. Start taking on:  March 31, 2018 What  changed:  These instructions start on March 31, 2018. If you are unsure what to do until then, ask your doctor or other care provider.   amoxicillin-clavulanate 875-125 MG tablet Commonly known as:  Augmentin Take 1 tablet by mouth 2 (two) times daily for 5 days. Start taking on:  March 30, 2018   busPIRone 10 MG tablet Commonly known as:  BUSPAR Take 1 tablet (10 mg total) by mouth 3 (three) times daily.   cholecalciferol 1000 units tablet Commonly known as:  VITAMIN D Take 1,000 Units by mouth daily.   DULoxetine 60 MG capsule Commonly known as:  CYMBALTA Take 1 capsule (60 mg total) by mouth daily.   gabapentin 600 MG tablet Commonly known as:  NEURONTIN Take 1 tablet (600 mg total) by mouth 2 (two) times daily.   Icy Hot Lidocaine Plus Menthol 4-1 % Ptch Generic drug:  Lidocaine-Menthol Apply 1 patch topically daily as needed (pain).   levothyroxine 75 MCG tablet Commonly known as:  SYNTHROID, LEVOTHROID Take 0.5-1 tablets (37.5-75 mcg total) by mouth See admin instructions. Alternate taking 75 mcg and 37.5 mcg once daily   loperamide 2 MG tablet Commonly known as:  IMODIUM A-D Take 2-4 mg by mouth as needed for diarrhea or loose stools.   meloxicam 7.5 MG tablet Commonly known as:  MOBIC Take 1 tablet (7.5 mg total) by mouth daily.   pantoprazole 40 MG tablet Commonly known as:  PROTONIX Take 1 tablet (40 mg total) by mouth 2 (two) times daily.   potassium chloride 10 MEQ tablet Commonly known as:  K-DUR Take 1 tablet (10 mEq total) by mouth daily.   pravastatin 40 MG tablet Commonly known as:  PRAVACHOL Take 1 tablet (40 mg total) by mouth daily.   traMADol 50 MG tablet Commonly known as:  ULTRAM Take 1 tablet (50 mg total) by mouth 2 (two) times daily.       Procedures/Studies: Ct Abdomen Pelvis Wo Contrast  Result Date: 03/28/2018 CLINICAL DATA:  Abdominal pain radiating to back EXAM: CT ABDOMEN AND PELVIS WITHOUT CONTRAST TECHNIQUE: Multidetector  CT imaging of the abdomen and pelvis was performed following the standard protocol without IV contrast. COMPARISON:  PET-CT dated 11/17/2016 FINDINGS: Lower chest: Suspected fiducial marker in the left lung base (series 4/image 4). Lungs are otherwise clear. Hepatobiliary: Unenhanced liver is unremarkable. Distended gallbladder, without associated inflammatory changes. No intrahepatic or extrahepatic ductal dilatation. Pancreas: Within normal limits. Spleen: Within normal limits. Adrenals/Urinary Tract: Adrenal glands are within normal limits. Kidneys are within normal limits. No renal calculi or hydronephrosis. Bladder is underdistended. Stomach/Bowel: Stomach is within normal limits. Duodenal diverticulum in the right mid abdomen (series 2/image 35). No evidence of bowel obstruction. Prior appendectomy. Vascular/Lymphatic: No evidence of abdominal aortic aneurysm. Atherosclerotic calcifications of the abdominal aorta and branch vessels. No suspicious abdominopelvic lymphadenopathy. Reproductive: Uterus is unremarkable. No adnexal masses. Other: No abdominopelvic ascites. Musculoskeletal: Mild degenerative changes at L5-S1. IMPRESSION: No evidence of bowel obstruction.  Prior appendectomy. Distended gallbladder, without associated inflammatory changes. No CT findings to account for the patient's abdominal pain. Electronically Signed   By: Julian Hy M.D.   On: 03/28/2018 21:50   Dg Chest 2 View  Result Date: 03/28/2018 CLINICAL DATA:  63 year old female with cough and abdominal pain. Smoker. EXAM: CHEST - 2 VIEW COMPARISON:  Chest CT 01/25/2018 and earlier. FINDINGS: Chronic volume loss in the left hemithorax and leftward shift of the mediastinum. Stable cardiac size and mediastinal contours. Stable small surgical clips about the left hilum and diaphragm. Visualized tracheal air column is within normal limits. No pneumothorax, pulmonary edema, pleural effusion or acute pulmonary opacity. The chronic left  apical lung opacity is better demonstrated by CT. No acute osseous abnormality identified. Negative visible bowel gas pattern. IMPRESSION: Stable, with chronic changes in the left hemithorax. No acute cardiopulmonary abnormality. Electronically Signed   By: Genevie Ann M.D.   On: 03/28/2018 21:44      Subjective: Pt says she feels much better and she wants to  go home.  She says that she has appointment to see dentist on Monday.  Pt says she is hungry and wants to eat.  Pt says she has potassium tablets at home.  Diarrhea resolved now.  Pt drinking well.    Discharge Exam: Vitals:   03/29/18 0600 03/29/18 0630  BP: 124/74 113/68  Pulse: 77 84  Resp:    Temp:    SpO2: 97% 97%   Vitals:   03/29/18 0500 03/29/18 0530 03/29/18 0600 03/29/18 0630  BP: 118/75 122/75 124/74 113/68  Pulse: 78 80 77 84  Resp:      Temp:      TempSrc:      SpO2: 99% 97% 97% 97%  Weight:      Height:        General: Pt is alert, awake, not in acute distress. Mostly edentulous, severe dental caries, gingivitis and possible gum abscess on left lower gums.  Cardiovascular: RRR, S1/S2 +, no rubs, no gallops Respiratory: CTA bilaterally, no wheezing, no rhonchi Abdominal: Soft, NT, ND, bowel sounds + Extremities: no edema, no cyanosis   The results of significant diagnostics from this hospitalization (including imaging, microbiology, ancillary and laboratory) are listed below for reference.     Microbiology: No results found for this or any previous visit (from the past 240 hour(s)).   Labs: BNP (last 3 results) No results for input(s): BNP in the last 8760 hours. Basic Metabolic Panel: Recent Labs  Lab 03/28/18 2004 03/29/18 0429  NA 135 136  K 2.4* 2.7*  CL 92* 101  CO2 27 24  GLUCOSE 109* 98  BUN 12 12  CREATININE 1.38* 1.11*  CALCIUM 8.8* 7.4*  MG 0.6* 2.1   Liver Function Tests: Recent Labs  Lab 03/28/18 2004  AST 27  ALT 13  ALKPHOS 69  BILITOT 1.2  PROT 8.7*  ALBUMIN 5.1*    Recent Labs  Lab 03/28/18 2004  LIPASE 22   No results for input(s): AMMONIA in the last 168 hours. CBC: Recent Labs  Lab 03/28/18 2004 03/29/18 0429  WBC 18.3* 13.9*  NEUTROABS 11.9*  --   HGB 13.7 11.4*  HCT 41.3 33.7*  MCV 80.2 79.7*  PLT 455* 331   Cardiac Enzymes: Recent Labs  Lab 03/28/18 2004  TROPONINI 0.03*   BNP: Invalid input(s): POCBNP CBG: No results for input(s): GLUCAP in the last 168 hours. D-Dimer No results for input(s): DDIMER in the last 72 hours. Hgb A1c No results for input(s): HGBA1C in the last 72 hours. Lipid Profile No results for input(s): CHOL, HDL, LDLCALC, TRIG, CHOLHDL, LDLDIRECT in the last 72 hours. Thyroid function studies No results for input(s): TSH, T4TOTAL, T3FREE, THYROIDAB in the last 72 hours.  Invalid input(s): FREET3 Anemia work up No results for input(s): VITAMINB12, FOLATE, FERRITIN, TIBC, IRON, RETICCTPCT in the last 72 hours. Urinalysis    Component Value Date/Time   COLORURINE YELLOW 03/29/2018 0145   APPEARANCEUR CLEAR 03/29/2018 0145   LABSPEC 1.006 03/29/2018 0145   PHURINE 6.0 03/29/2018 0145   GLUCOSEU NEGATIVE 03/29/2018 0145   HGBUR SMALL (A) 03/29/2018 0145   BILIRUBINUR NEGATIVE 03/29/2018 0145   BILIRUBINUR negative 12/19/2017 1549   KETONESUR 5 (A) 03/29/2018 0145   PROTEINUR NEGATIVE 03/29/2018 0145   UROBILINOGEN 2.0 (A) 12/19/2017 1549   UROBILINOGEN 0.2 12/15/2013 1335   NITRITE NEGATIVE 03/29/2018 0145   LEUKOCYTESUR NEGATIVE 03/29/2018 0145   Sepsis Labs Invalid input(s): PROCALCITONIN,  WBC,  LACTICIDVEN Microbiology No results found for this or  any previous visit (from the past 240 hour(s)).  Time coordinating discharge:  SIGNED:  Irwin Brakeman, MD  [facility] 03/29/2018, 11:22 AM How to contact the [facility] Attending or Consulting provider [place] or covering provider during after hours [place], for this patient?  1. Check the care team in [facility] and look for a)  attending/consulting [facility] provider listed and b) the [facility] team listed 2. Log into [email or web address] and use [place]'s universal password to access. If you do not have the password, please contact the hospital operator. 3. Locate the [facility] provider you are looking for under [facility] and page to a number that you can be directly reached. 4. If you still have difficulty reaching the provider, please page the [facility] (Director on Call) for the Hospitalists listed on amion for assistance.

## 2018-03-29 NOTE — Progress Notes (Signed)
Patient alert and oriented x4.  No complaints of pain, shortness of breath, chest pain, dizziness, nausea or vomiting. Patient up out of bed, ambulatory independently with steady gait. Patient tolerated PO & IV medications well. Patient tolerated diet well, appetite good. IV removed without complications. Discharge instructions, appointment follow-ups and medication & diet education gone over with patient. Patient expressed full understanding of instructions, appointment information and education. Patient discharged with all belongings for home via car (son driving patient home).

## 2018-03-29 NOTE — H&P (Signed)
History and Physical    Kelly Douglas DOB: Jun 13, 1955 DOA: 03/28/2018  PCP: [name], MD  Patient coming from: Home  Chief Complaint: Abdominal pain nausea and vomiting  HPI: Kelly Douglas is a 63 y.o. female with medical history significant of anxiety, chronic back pain, hypertension comes in with over 5 days of abdominal pain which she is very vague about but seems to be lower with no associated urinary symptoms with no fevers.  She has been vomiting for several days also not able to hold a lot down.  She denies any diarrhea.  She denies blood in her vomit.  Patient is received IV fluids and medication in the ED which she feels much better.  Her potassium level and magnesium level were both noted to be low.  Urinalysis is pending.  Patient be referred for admission for dehydration.  Review of Systems: As per HPI otherwise 10 point review of systems negative.   Past Medical History:  Diagnosis Date  . Anxiety   . Arteriosclerotic cardiovascular disease (ASCVD) 2013   coronary calcification-left main, LAD and CX; small pericardial effusion  . Arthritis   . Chronic back pain   . Chronic bronchitis   . Chronic lung disease    Chronic scarring and volume loss-left lung; characteristics of a chronic infectious process-possible MAI  . Depression   . Dyspnea   . GERD (gastroesophageal reflux disease)    Dr Gala Romney EGD 09/2009->esophagitis, sm HH, antral erosions, atonic esophagus  . Hyperlipidemia   . Hypertension   . Hypothyroid 1981 approx  . Scleroderma ([place])   . Seasonal allergies   . Syncope    Multiple spells over the past 40+ years, likely neurocardiogenic  . Tobacco abuse 06/24/2009    Past Surgical History:  Procedure Laterality Date  . BILATERAL SALPINGOOPHORECTOMY  2013    Dr. Elonda Husky; uterus remains in situ  . COLONOSCOPY  09/28/09   anal papilla otherwise normal  . COLONOSCOPY N/A 08/29/2012   GBT:DVVOHYW polyp-removed as described above.  tubular adenoma  . ESOPHAGOGASTRODUODENOSCOPY  10/07/09   Dr. Barrie Dunker of esophageal mucosa, diffusely ?esophagitis (bx benign), small HH/antral erosions, erythema bx benign. 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 ESOPHAGOGASTRODUODENOSCOPY (EGD) WITH ESOPHAGEAL DILATION N/A 03/07/2012   VPX:[id number], patent, tubular esophagus of uncertain significance-status post biopsy )unremarkable). Hiatal hernia  . FUDUCIAL PLACEMENT  10/18/2017   Procedure: PLACEMENT OF 3 FUDUCIAL INTO LEFT LOWER LOBE OF LUNG-TARGET 1;  Surgeon: Collene Gobble, MD;  Location: [facility];  Service: Thoracic;;  . LAPAROSCOPIC APPENDECTOMY  07/16/2011   Procedure: APPENDECTOMY LAPAROSCOPIC;  Surgeon: Donato Heinz, MD;  Location: [facility];  Service: General;  Laterality: N/A;  . TUBAL LIGATION    . VIDEO BRONCHOSCOPY  10/18/2017  . VIDEO BRONCHOSCOPY WITH ENDOBRONCHIAL NAVIGATION N/A 10/18/2017   Procedure: VIDEO BRONCHOSCOPY WITH ENDOBRONCHIAL NAVIGATION;  Surgeon: Collene Gobble, MD;  Location: [facility];  Service: Thoracic;  Laterality: N/A;     reports that she has been smoking cigarettes. She started smoking about 46 years ago. She has a 22.50 pack-year smoking history. She has never used smokeless tobacco. She reports that she does not drink alcohol or use drugs.  Allergies  Allergen Reactions  . Aspirin Other (See Comments)    Nose bleeds    Family History  Problem Relation Age of Onset  . Stroke Father   . Aneurysm Father   . Coronary artery disease Brother   . Hypertension Brother   . Down syndrome  Brother   . Hypertension Brother   . Hypertension Brother        father/mother  . Aneurysm Mother   . Throat cancer Brother 49       throat cancer  . Hypertension Sister   . Diabetes Brother   . Diabetes Brother   . Anesthesia problems Neg Hx   . Hypotension Neg Hx   . Malignant hyperthermia Neg Hx   . Pseudochol deficiency Neg Hx   . Colon cancer Neg Hx     Prior to Admission medications    Medication Sig Start Date End Date Taking? Authorizing Provider  acetaminophen (TYLENOL) 650 MG CR tablet Take 1,300 mg by mouth 2 (two) times daily.    Yes [provider]  amLODipine (NORVASC) 5 MG tablet Take 1 tablet (5 mg total) by mouth daily. 12/19/17  Yes [name], MD  busPIRone (BUSPAR) 10 MG tablet Take 1 tablet (10 mg total) by mouth 3 (three) times daily. 12/05/17  Yes [name], MD  cholecalciferol (VITAMIN D) 1000 units tablet Take 1,000 Units by mouth daily.   Yes [provider]  DULoxetine (CYMBALTA) 60 MG capsule Take 1 capsule (60 mg total) by mouth daily. 12/19/17  Yes [name], MD  gabapentin (NEURONTIN) 600 MG tablet Take 1 tablet (600 mg total) by mouth 2 (two) times daily. 10/18/17  Yes Collene Gobble, MD  levothyroxine (SYNTHROID, LEVOTHROID) 75 MCG tablet Take 0.5-1 tablets (37.5-75 mcg total) by mouth See admin instructions. Alternate taking 75 mcg and 37.5 mcg once daily 12/19/17  Yes [name], MD  Lidocaine-Menthol (ICY HOT LIDOCAINE PLUS MENTHOL) 4-1 % PTCH Apply 1 patch topically daily as needed (pain).   Yes [provider]  loperamide (IMODIUM A-D) 2 MG tablet Take 2-4 mg by mouth as needed for diarrhea or loose stools.   Yes [provider]  meloxicam (MOBIC) 7.5 MG tablet Take 1 tablet (7.5 mg total) by mouth daily. 12/19/17  Yes [name], MD  pantoprazole (PROTONIX) 40 MG tablet Take 1 tablet (40 mg total) by mouth 2 (two) times daily. 12/19/17  Yes [name], MD  potassium chloride (K-DUR) 10 MEQ tablet Take 1 tablet (10 mEq total) by mouth daily. 12/19/17  Yes [name], MD  pravastatin (PRAVACHOL) 40 MG tablet Take 1 tablet (40 mg total) by mouth daily. 12/19/17  Yes [name], MD  traMADol (ULTRAM) 50 MG tablet Take 1 tablet (50 mg total) by mouth 2 (two) times daily. 09/14/17  Yes [name], MD    Physical Exam: Vitals:   03/28/18 1941  03/28/18 1942 03/28/18 2100  BP: 95/66  114/72  Pulse: (!) 134  95  Resp: 16  (!) 23  Temp: 97.9 F (36.6 C)    TempSrc: Oral    SpO2: 98%  99%  Weight:  68 kg   Height:  5\' 9"  (1.753 m)       Constitutional: NAD, calm, comfortable Vitals:   03/28/18 1941 03/28/18 1942 03/28/18 2100  BP: 95/66  114/72  Pulse: (!) 134  95  Resp: 16  (!) 23  Temp: 97.9 F (36.6 C)    TempSrc: Oral    SpO2: 98%  99%  Weight:  68 kg   Height:  5\' 9"  (1.753 m)    Eyes: PERRL, lids and conjunctivae normal ENMT: Mucous membranes are moist. Posterior pharynx clear of any exudate or lesions.Normal dentition.  Neck: normal, supple, no  masses, no thyromegaly Respiratory: clear to auscultation bilaterally, no wheezing, no crackles. Normal respiratory effort. No accessory muscle use.  Cardiovascular: Regular rate and rhythm, no murmurs / rubs / gallops. No extremity edema. 2+ pedal pulses. No carotid bruits.  Abdomen: no tenderness, no masses palpated. No hepatosplenomegaly. Bowel sounds positive.  Musculoskeletal: no clubbing / cyanosis. No joint deformity upper and lower extremities. Good ROM, no contractures. Normal muscle tone.  Skin: no rashes, lesions, ulcers. No induration Neurologic: CN 2-12 grossly intact. Sensation intact, DTR normal. Strength 5/5 in all 4.  Psychiatric: Normal judgment and insight. Alert and oriented x 3. Normal mood.    Labs on Admission: I have personally reviewed following labs and imaging studies  CBC: Recent Labs  Lab 03/28/18 2004  WBC 18.3*  NEUTROABS 11.9*  HGB 13.7  HCT 41.3  MCV 80.2  PLT 220*   Basic Metabolic Panel: Recent Labs  Lab 03/28/18 2004  NA 135  K 2.4*  CL 92*  CO2 27  GLUCOSE 109*  BUN 12  CREATININE 1.38*  CALCIUM 8.8*  MG 0.6*   GFR: Estimated Creatinine Clearance: 44.2 mL/min (A) (by C-G formula based on SCr of 1.38 mg/dL (H)). Liver Function Tests: Recent Labs  Lab 03/28/18 2004  AST 27  ALT 13  ALKPHOS 69  BILITOT  1.2  PROT 8.7*  ALBUMIN 5.1*   Recent Labs  Lab 03/28/18 2004  LIPASE 22   No results for input(s): AMMONIA in the last 168 hours. Coagulation Profile: No results for input(s): INR, PROTIME in the last 168 hours. Cardiac Enzymes: Recent Labs  Lab 03/28/18 2004  TROPONINI 0.03*   BNP (last 3 results) No results for input(s): PROBNP in the last 8760 hours. HbA1C: No results for input(s): HGBA1C in the last 72 hours. CBG: No results for input(s): GLUCAP in the last 168 hours. Lipid Profile: No results for input(s): CHOL, HDL, LDLCALC, TRIG, CHOLHDL, LDLDIRECT in the last 72 hours. Thyroid Function Tests: No results for input(s): TSH, T4TOTAL, FREET4, T3FREE, THYROIDAB in the last 72 hours. Anemia Panel: No results for input(s): VITAMINB12, FOLATE, FERRITIN, TIBC, IRON, RETICCTPCT in the last 72 hours. Urine analysis:    Component Value Date/Time   COLORURINE YELLOW 12/15/2013 1335   APPEARANCEUR HAZY (A) 12/15/2013 1335   LABSPEC 1.015 12/15/2013 1335   PHURINE 8.5 (H) 12/15/2013 1335   GLUCOSEU NEGATIVE 12/15/2013 1335   HGBUR SMALL (A) 12/15/2013 1335   BILIRUBINUR negative 12/19/2017 1549   KETONESUR negative 12/19/2017 1549   KETONESUR NEGATIVE 12/15/2013 1335   PROTEINUR NEGATIVE 12/15/2013 1335   UROBILINOGEN 2.0 (A) 12/19/2017 1549   UROBILINOGEN 0.2 12/15/2013 1335   NITRITE Negative 12/19/2017 1549   NITRITE NEGATIVE 12/15/2013 1335   LEUKOCYTESUR Negative 12/19/2017 1549   Sepsis Labs: !!!!!!!!!!!!!!!!!!!!!!!!!!!!!!!!!!!!!!!!!!!! @LABRCNTIP (procalcitonin:4,lacticidven:4) )No results found for this or any previous visit (from the past 240 hour(s)).   Radiological Exams on Admission: Ct Abdomen Pelvis Wo Contrast  Result Date: 03/28/2018 CLINICAL DATA:  Abdominal pain radiating to back EXAM: CT ABDOMEN AND PELVIS WITHOUT CONTRAST TECHNIQUE: Multidetector CT imaging of the abdomen and pelvis was performed following the standard protocol without IV contrast.  COMPARISON:  PET-CT dated 11/17/2016 FINDINGS: Lower chest: Suspected fiducial marker in the left lung base (series 4/image 4). Lungs are otherwise clear. Hepatobiliary: Unenhanced liver is unremarkable. Distended gallbladder, without associated inflammatory changes. No intrahepatic or extrahepatic ductal dilatation. Pancreas: Within normal limits. Spleen: Within normal limits. Adrenals/Urinary Tract: Adrenal glands are within normal limits. Kidneys are within normal  limits. No renal calculi or hydronephrosis. Bladder is underdistended. Stomach/Bowel: Stomach is within normal limits. Duodenal diverticulum in the right mid abdomen (series 2/image 35). No evidence of bowel obstruction. Prior appendectomy. Vascular/Lymphatic: No evidence of abdominal aortic aneurysm. Atherosclerotic calcifications of the abdominal aorta and branch vessels. No suspicious abdominopelvic lymphadenopathy. Reproductive: Uterus is unremarkable. No adnexal masses. Other: No abdominopelvic ascites. Musculoskeletal: Mild degenerative changes at L5-S1. IMPRESSION: No evidence of bowel obstruction.  Prior appendectomy. Distended gallbladder, without associated inflammatory changes. No CT findings to account for the patient's abdominal pain. Electronically Signed   By: Julian Hy M.D.   On: 03/28/2018 21:50   Dg Chest 2 View  Result Date: 03/28/2018 CLINICAL DATA:  63 year old female with cough and abdominal pain. Smoker. EXAM: CHEST - 2 VIEW COMPARISON:  Chest CT 01/25/2018 and earlier. FINDINGS: Chronic volume loss in the left hemithorax and leftward shift of the mediastinum. Stable cardiac size and mediastinal contours. Stable small surgical clips about the left hilum and diaphragm. Visualized tracheal air column is within normal limits. No pneumothorax, pulmonary edema, pleural effusion or acute pulmonary opacity. The chronic left apical lung opacity is better demonstrated by CT. No acute osseous abnormality identified. Negative  visible bowel gas pattern. IMPRESSION: Stable, with chronic changes in the left hemithorax. No acute cardiopulmonary abnormality. Electronically Signed   By: Genevie Ann M.D.   On: 03/28/2018 21:44    Assessment/Plan 63 year old female with dehydration and hypokalemia and hypomagnesemia Principal Problem:   Hypokalemia-give KCl 60 mEq IV through the night and 10 mEq p.o. twice a day.  Repeat mag.  Placed on telemetry monitoring.  Due to GI losses.  Active Problems:   Hypomagnesemia-give mag sulfate 4 g IV tonight repeat level in the morning.   Dehydration-IV fluids   COPD (chronic obstructive pulmonary disease) (HCC)-stable   Essential hypertension-hold blood pressure medications as her blood pressure soft at this time Possible UTI-cover with Rocephin until it is clear that her urinalysis does not show infection which is underlying cause of her symptoms.   DVT prophylaxis: SCDs Code Status: Full Family Communication: None Disposition Plan: 24 hours Consults called: None Admission status: Observation   Arnell Slivinski A MD [facility]  If 7PM-7AM, please contact night-coverage [email or web address] Password [facility]  03/29/2018, 12:02 AM

## 2018-03-30 LAB — URINE CULTURE: Culture: <10000 — AB

## 2018-04-02 ENCOUNTER — Telehealth: Payer: Self-pay

## 2018-04-02 NOTE — Telephone Encounter (Signed)
Transition Care Management Follow-up Telephone Call   Date discharged? 03/29/2018                How have you been since you were released from the hospital? still feels light headed and tired sometimes   Do you understand why you were in the hospital? Potassium and magnesium were low. BP were low   Do you understand the discharge instructions? yes   Where were you discharged to? home   Items Reviewed:  Medications reviewed: yes  Allergies reviewed: yes  Dietary changes reviewed: yes  Referrals reviewed: yes   Functional Questionnaire:   Activities of Daily Living (ADLs):  yes and has help at home if needed    Any transportation issues/concerns?: no   Any patient concerns? she doesn't understand why her levels dropped   Confirmed importance and date/time of follow-up visits scheduled 04/06/18 at 9:20am with Jarrett Soho. Patient aware it is with Jarrett Soho and not Dr.Simpson     Confirmed with patient if condition begins to worsen call PCP or go to the ER.  Patient was given the office number and encouraged to call back with question or concerns.yes with verbal understanding

## 2018-04-02 NOTE — Telephone Encounter (Signed)
[id number]  Attempted to contact patient to complete [facility] telephone call.Husband stated she is sleeping. He will have her call me back when he wakes her up later this afternoon. 1st attempt

## 2018-04-04 DIAGNOSIS — M349 Systemic sclerosis, unspecified: Secondary | ICD-10-CM | POA: Diagnosis not present

## 2018-04-04 DIAGNOSIS — I10 Essential (primary) hypertension: Secondary | ICD-10-CM | POA: Diagnosis not present

## 2018-04-04 DIAGNOSIS — J449 Chronic obstructive pulmonary disease, unspecified: Secondary | ICD-10-CM | POA: Diagnosis not present

## 2018-04-04 DIAGNOSIS — K580 Irritable bowel syndrome with diarrhea: Secondary | ICD-10-CM | POA: Diagnosis not present

## 2018-04-06 ENCOUNTER — Other Ambulatory Visit: Payer: Self-pay

## 2018-04-06 ENCOUNTER — Encounter: Payer: Self-pay | Admitting: Family Medicine

## 2018-04-06 ENCOUNTER — Ambulatory Visit (INDEPENDENT_AMBULATORY_CARE_PROVIDER_SITE_OTHER): Payer: Medicare Other | Admitting: Family Medicine

## 2018-04-06 ENCOUNTER — Telehealth: Payer: Self-pay | Admitting: Family Medicine

## 2018-04-06 ENCOUNTER — Telehealth: Payer: Self-pay

## 2018-04-06 DIAGNOSIS — IMO0001 Reserved for inherently not codable concepts without codable children: Secondary | ICD-10-CM

## 2018-04-06 DIAGNOSIS — E876 Hypokalemia: Secondary | ICD-10-CM

## 2018-04-06 DIAGNOSIS — Z9189 Other specified personal risk factors, not elsewhere classified: Secondary | ICD-10-CM | POA: Diagnosis not present

## 2018-04-06 DIAGNOSIS — F17218 Nicotine dependence, cigarettes, with other nicotine-induced disorders: Secondary | ICD-10-CM

## 2018-04-06 NOTE — Telephone Encounter (Signed)
Labs reordered for [facility]

## 2018-04-06 NOTE — Telephone Encounter (Signed)
error 

## 2018-04-06 NOTE — Patient Instructions (Signed)
Thank you for coming into the office today. I appreciate the opportunity to provide you with the care for your health and wellness. Today we discussed: your follow up for lab work (please get these sometime in the next week); smoking cessation, and follow up appt with Dr Moshe Cipro.  I have attached some smoking resources for you to read. I hope you find them helpful.   [place] YOUR HANDS WELL AND FREQUENTLY. AVOID TOUCHING YOUR FACE, UNLESS YOUR HANDS ARE FRESHLY WASHED.   GET FRESH AIR DAILY. STAY HYDRATED WITH WATER.   It was a pleasure to see you and I look forward to continuing to work together on your health and well-being. Please do not hesitate to call the office if you need care or have questions about your care.  Have a wonderful day and week.  With Gratitude,  Cherly Beach, DNP, AGNP-BC  Coping with Quitting Smoking  Quitting smoking is a physical and mental challenge. You will face cravings, withdrawal symptoms, and temptation. Before quitting, work with your health care provider to make a plan that can help you cope. Preparation can help you quit and keep you from giving in. How can I cope with cravings? Cravings usually last for 5-10 minutes. If you get through it, the craving will pass. Consider taking the following actions to help you cope with cravings:  Keep your mouth busy: ? Chew sugar-free gum. ? Suck on hard candies or a straw. ? Brush your teeth.  Keep your hands and body busy: ? Immediately change to a different activity when you feel a craving. ? Squeeze or play with a ball. ? Do an activity or a hobby, like making bead jewelry, practicing needlepoint, or working with wood. ? Mix up your normal routine. ? Take a short exercise break. Go for a quick walk or run up and down stairs. ? Spend time in public places where smoking is not allowed.  Focus on doing something kind or helpful for someone else.  Call a friend or family member to talk during a  craving.  Join a support group.  Call a quit line, such as 1-800-QUIT-NOW.  Talk with your health care provider about medicines that might help you cope with cravings and make quitting easier for you. How can I deal with withdrawal symptoms? Your body may experience negative effects as it tries to get used to not having nicotine in the system. These effects are called withdrawal symptoms. They may include:  Feeling hungrier than normal.  Trouble concentrating.  Irritability.  Trouble sleeping.  Feeling depressed.  Restlessness and agitation.  Craving a cigarette. To manage withdrawal symptoms:  Avoid places, people, and activities that trigger your cravings.  Remember why you want to quit.  Get plenty of sleep.  Avoid coffee and other caffeinated drinks. These may worsen some of your symptoms. How can I handle social situations? Social situations can be difficult when you are quitting smoking, especially in the first few weeks. To manage this, you can:  Avoid parties, bars, and other social situations where people might be smoking.  Avoid alcohol.  Leave right away if you have the urge to smoke.  Explain to your family and friends that you are quitting smoking. Ask for understanding and support.  Plan activities with friends or family where smoking is not an option. What are some ways I can cope with stress? Wanting to smoke may cause stress, and stress can make you want to smoke. Find ways to  manage your stress. Relaxation techniques can help. For example:  Breathe slowly and deeply, in through your nose and out through your mouth.  Listen to soothing, relaxing music.  Talk with a family member or friend about your stress.  Light a candle.  Soak in a bath or take a shower.  Think about a peaceful place. What are some ways I can prevent weight gain? Be aware that many people gain weight after they quit smoking. However, not everyone does. To keep from  gaining weight, have a plan in place before you quit and stick to the plan after you quit. Your plan should include:  Having healthy snacks. When you have a craving, it may help to: ? Eat plain popcorn, crunchy carrots, celery, or other cut vegetables. ? Chew sugar-free gum.  Changing how you eat: ? Eat small portion sizes at meals. ? Eat 4-6 small meals throughout the day instead of 1-2 large meals a day. ? Be mindful when you eat. Do not watch television or do other things that might distract you as you eat.  Exercising regularly: ? Make time to exercise each day. If you do not have time for a long workout, do short bouts of exercise for 5-10 minutes several times a day. ? Do some form of strengthening exercise, like weight lifting, and some form of aerobic exercise, like running or swimming.  Drinking plenty of water or other low-calorie or no-calorie drinks. Drink 6-8 glasses of water daily, or as much as instructed by your health care provider. Summary  Quitting smoking is a physical and mental challenge. You will face cravings, withdrawal symptoms, and temptation to smoke again. Preparation can help you as you go through these challenges.  You can cope with cravings by keeping your mouth busy (such as by chewing gum), keeping your body and hands busy, and making calls to family, friends, or a helpline for people who want to quit smoking.  You can cope with withdrawal symptoms by avoiding places where people smoke, avoiding drinks with caffeine, and getting plenty of rest.  Ask your health care provider about the different ways to prevent weight gain, avoid stress, and handle social situations. This information is not intended to replace advice given to you by your health care provider. Make sure you discuss any questions you have with your health care provider. Document Released: 01/01/2016 Document Revised: 01/01/2016 Document Reviewed: 01/01/2016 Elsevier Interactive Patient  Education  2019 [employer]   Smoking Tobacco Information, Adult Smoking tobacco can be harmful to your health. Tobacco contains a poisonous (toxic), colorless chemical called nicotine. Nicotine is addictive. It changes the brain and can make it hard to stop smoking. Tobacco also has other toxic chemicals that can hurt your body and raise your risk of many cancers. How can smoking tobacco affect me? Smoking tobacco puts you at risk for:  Cancer. Smoking is most commonly associated with lung cancer, but can also lead to cancer in other parts of the body.  Chronic obstructive pulmonary disease (COPD). This is a long-term lung condition that makes it hard to breathe. It also gets worse over time.  High blood pressure (hypertension), heart disease, stroke, or heart attack.  Lung infections, such as pneumonia.  Cataracts. This is when the lenses in the eyes become clouded.  Digestive problems. This may include peptic ulcers, heartburn, and gastroesophageal reflux disease (GERD).  Oral health problems, such as gum disease and tooth loss.  Loss of taste and smell. Smoking can affect  your appearance by causing:  Wrinkles.  Yellow or stained teeth, fingers, and fingernails. Smoking tobacco can also affect your social life, because:  It may be challenging to find places to smoke when away from home. Many workplaces, [employer], hotels, and public places are tobacco-free.  Smoking is expensive. This is due to the cost of tobacco and the long-term costs of treating health problems from smoking.  Secondhand smoke may affect those around you. Secondhand smoke can cause lung cancer, breathing problems, and heart disease. Children of smokers have a higher risk for: ? Sudden infant death syndrome (SIDS). ? Ear infections. ? Lung infections. If you currently smoke tobacco, quitting now can help you:  Lead a longer and healthier life.  Look, smell, breathe, and feel better over time.   Save money.  Protect others from the harms of secondhand smoke. What actions can I take to prevent health problems? Quit smoking   Do not start smoking. Quit if you already do.  Make a plan to quit smoking and commit to it. Look for programs to help you and ask your health care provider for recommendations and ideas.  Set a date and write down all the reasons you want to quit.  Let your friends and family know you are quitting so they can help and support you. Consider finding friends who also want to quit. It can be easier to quit with someone else, so that you can support each other.  Talk with your health care provider about using nicotine replacement medicines to help you quit, such as gum, lozenges, patches, sprays, or pills.  Do not replace cigarette smoking with electronic cigarettes, which are commonly called e-cigarettes. The safety of e-cigarettes is not known, and some may contain harmful chemicals.  If you try to quit but return to smoking, stay positive. It is common to slip up when you first quit, so take it one day at a time.  Be prepared for cravings. When you feel the urge to smoke, chew gum or suck on hard candy. Lifestyle  Stay busy and take care of your body.  Drink enough fluid to keep your urine pale yellow.  Get plenty of exercise and eat a healthy diet. This can help prevent weight gain after quitting.  Monitor your eating habits. Quitting smoking can cause you to have a larger appetite than when you smoke.  Find ways to relax. Go out with friends or family to a movie or a restaurant where people do not smoke.  Ask your health care provider about having regular tests (screenings) to check for cancer. This may include blood tests, imaging tests, and other tests.  Find ways to manage your stress, such as meditation, yoga, or exercise. Where to find support To get support to quit smoking, consider:  Asking your health care provider for more information and  resources.  Taking classes to learn more about quitting smoking.  Looking for local organizations that offer resources about quitting smoking.  Joining a support group for people who want to quit smoking in your local community.  Calling the smokefree.gov counselor helpline: 1-800-Quit-Now (214) 580-7794) Where to find more information You may find more information about quitting smoking from:  HelpGuide.org: www.helpguide.org  https://hall.com/: smokefree.gov  American Lung Association: www.lung.org Contact a health care provider if you:  Have problems breathing.  Notice that your lips, nose, or fingers turn blue.  Have chest pain.  Are coughing up blood.  Feel faint or you pass out.  Have other health  changes that cause you to worry. Summary  Smoking tobacco can negatively affect your health, the health of those around you, your finances, and your social life.  Do not start smoking. Quit if you already do. If you need help quitting, ask your health care provider.  Think about joining a support group for people who want to quit smoking in your local community. There are many effective programs that will help you to quit this behavior. This information is not intended to replace advice given to you by your health care provider. Make sure you discuss any questions you have with your health care provider. Document Released: 01/19/2016 Document Revised: 02/22/2017 Document Reviewed: 01/19/2016 Elsevier Interactive Patient Education  2019 [employer].

## 2018-04-06 NOTE — Progress Notes (Signed)
TELEMEDICINE Visit   I connected with Kelly Douglas on 04/06/18 at 9:30 am by telephone via telemedicine and verified that I am speaking with the correct person using two identifiers.  Patient Location: Home Provider Location: Office   I discussed the limitations of evaluation and management by telemedicine.  The patient expressed understanding and consented, agreed to proceed.  History of Present Illness: TOC:  Kelly P Alversonis a 63 y.o.femalewith medical history significant ofanxiety, chronic back pain, hypertension. Was admitted to [facility] secondary to: abdominal pain, n/v, unable to keep fluids down. She received IV fluids and medication in the ED and started feeling better. D/t low potassium level and magnesium level she was admitted for dehydration. They continued IVF and gave both magnesium and potassium.    She was also started on antibiotics.  She has severe dental caries and likely has a dental abscess she has an appointment to see a dentist on 04/02/2018.  Which she did follow up with and goes back on 04/09/2018.  At d/c she was given a prescription of Augmentin to take for the next 5 days until she can see her dentist.    Ms. [name] reports she is maintaining hydration, and taking her potassium as directed.  Reports that she is able to keep things down now and is drinking multiple types of fluids during the day.  Reports orange juice, Gatorade, coffee, water.  Reports that she does not have any pains or signs or symptoms that she had previously when going into the hospital.  Needs to have magnesium and potassium redrawn.  Reports that she is still smoking cigarettes daily 9 cigarettes a day exactly.  Does want to quit.  Does not want to receive any help from [place] at this time.  Has questions about possible lymph node test per Dr. Lamonte Sakai previously in his note it states that they had axillary lymph nodes that needed to be followed up by PCP.  Unable to fully assess this  situation today in the office due to telephone visit.  Does have follow-up with Dr. Moshe Cipro in May of this year.  Advised to maintain that and discussed lymph nodes at that time if she can be seen in person.  Overall today in the telemedicine conversation patient feels much better than she was feeling.  Reports she will be following up with her dentist.  Denies having any signs and symptoms of hypo-magnesium or hypokalemia.  Denies having any signs or symptoms of high blood pressure.  Denies having any trouble with her medications.    Past Medical, Surgical, Social History, Allergies, and Medications have been Reviewed.   Review of Systems  Constitutional: Negative for chills and fever.  HENT: Negative for congestion, sinus pain and sore throat.   Eyes: Negative.   Respiratory: Negative for cough, shortness of breath and wheezing.   Cardiovascular: Negative for chest pain, palpitations and leg swelling.  Gastrointestinal: Negative.   Genitourinary: Negative.   Musculoskeletal: Negative.   Skin: Negative.   Neurological: Negative.   Endo/Heme/Allergies: Negative.   Psychiatric/Behavioral: Negative.   All other systems reviewed and are negative.     Observations/Objective:  Magnesium: 03/29/2018: 2.1                      03/28/2018: 0.6  CMP Latest Ref Rng & Units 03/29/2018 03/28/2018 12/18/2017  Glucose 70 - 99 mg/dL 98 109(H) 87  BUN 8 - 23 mg/dL 12 12 3(L)  Creatinine 0.44 - 1.00  mg/dL 1.11(H) 1.38(H) 0.92  Sodium 135 - 145 mmol/L 136 135 141  Potassium 3.5 - 5.1 mmol/L 2.7(LL) 2.4(LL) 3.5  Chloride 98 - 111 mmol/L 101 92(L) 94(L)  CO2 22 - 32 mmol/L 24 27 28   Calcium 8.9 - 10.3 mg/dL 7.4(L) 8.8(L) 8.1(L)  Total Protein 6.5 - 8.1 g/dL - 8.7(H) 7.0  Total Bilirubin 0.3 - 1.2 mg/dL - 1.2 0.6  Alkaline Phos 38 - 126 U/L - 69 89  AST 15 - 41 U/L - 27 18  ALT 0 - 44 U/L - 13 10    Assessment and Plan:  1. Transition of care performed with sharing of clinical summary Reports  that she is feeling much better since being discharged.  Does report that she needs to follow-up of the dentist this upcoming Monday.  Was seen this week.  She has multiple dental caries, abscesses.  Could be the culprit of possible infections.  Is going to be having as many of these teeth pulled as possible. Denies having any signs or symptoms of electrolyte irregularities today in the office.  Will be rechecking electrolytes with labs. Advised to continue good hydration.  Advised to keep appointment with Dr. Moshe Cipro in May.  As she needs follow-up for axillary lymph nodes that were found by Dr. [place] Carbo.  As per his note is has follow-up with PCP.  Unable to assess these via the telephone.  So advised her to follow-up with Dr. Moshe Cipro with this at her next appointment.  2. Hypokalemia Reports keeping fluids down, and drink much more fluid since being d/c. Reports taking her potassium as ordered. Will continue this for now.  Reassess with labs.  3. Hypomagnesemia See above.  Will reassess with labs  4. Cigarette nicotine dependence with other nicotine-induced disorder Continues to smoke cigarettes.  Not a day.  Reports that she wants to quit but she is not ready to quit.  Reports that she does not want any medication at this time.  Or help from [place].  Will provide her with literature via mail.  Encouraged her to continue quitting and slowing down.  Asked about quitting: confirms they are currently smokes cigarettes Advise to quit smoking: Educated about QUITTING to reduce the risk of cancer, cardio and cerebrovascular disease. Assess willingness: Unwilling to quit at this time, but is working on cutting back. Assist with counseling and pharmacotherapy: Counseled for 7 minutes and literature mailed. Declined medication. Arrange for follow up: follow up in 3 months and continue to offer help.   Follow Up Instructions:  Follow up in May with Dr Moshe Cipro. I discussed the assessment and treatment plan  with the patient. The patient was provided an opportunity to ask questions and all were answered. The patient agreed with the plan and demonstrated an understanding of the instructions.   The patient was advised to call back or seek an in-person evaluation if the symptoms worsen or if the condition fails to improve as anticipated.  I provided 20 minutes of non-face-to-face time during this encounter.   Perlie Mayo, NP

## 2018-04-09 ENCOUNTER — Other Ambulatory Visit: Payer: Self-pay

## 2018-04-09 ENCOUNTER — Telehealth: Payer: Self-pay | Admitting: *Deleted

## 2018-04-09 DIAGNOSIS — M19019 Primary osteoarthritis, unspecified shoulder: Secondary | ICD-10-CM

## 2018-04-09 MED ORDER — POTASSIUM CHLORIDE ER 10 MEQ PO TBCR: 10.0000 meq | EXTENDED_RELEASE_TABLET | Freq: Every day | ORAL | 0 refills | Status: DC

## 2018-04-09 MED ORDER — MELOXICAM 7.5 MG PO TABS: 7.5000 mg | ORAL_TABLET | Freq: Every day | ORAL | 0 refills | Status: DC

## 2018-04-09 MED ORDER — PANTOPRAZOLE SODIUM 40 MG PO TBEC: 40.0000 mg | DELAYED_RELEASE_TABLET | Freq: Two times a day (BID) | ORAL | 0 refills | Status: DC

## 2018-04-09 MED ORDER — LEVOTHYROXINE SODIUM 75 MCG PO TABS: 37.5000 ug | ORAL_TABLET | ORAL | 0 refills | Status: DC

## 2018-04-09 MED ORDER — BUSPIRONE HCL 10 MG PO TABS: 10.0000 mg | ORAL_TABLET | Freq: Three times a day (TID) | ORAL | 0 refills | Status: DC

## 2018-04-09 MED ORDER — DULOXETINE HCL 60 MG PO CPEP: 60.0000 mg | ORAL_CAPSULE | Freq: Every day | ORAL | 0 refills | Status: DC

## 2018-04-09 MED ORDER — AMLODIPINE BESYLATE 5 MG PO TABS: 5.0000 mg | ORAL_TABLET | Freq: Every day | ORAL | 0 refills | Status: DC

## 2018-04-09 MED ORDER — PRAVASTATIN SODIUM 40 MG PO TABS: 40.0000 mg | ORAL_TABLET | Freq: Every day | ORAL | 0 refills | Status: DC

## 2018-04-09 NOTE — Telephone Encounter (Signed)
Medications sent to [facility]

## 2018-04-09 NOTE — Telephone Encounter (Signed)
Pt called wanting to change her prescriptions from [place] to [facility]. She called them and they told her to give [place] the phone number [phone]. She said all we needed to do was call them and they would switch it over

## 2018-04-19 ENCOUNTER — Other Ambulatory Visit: Payer: Self-pay

## 2018-04-19 ENCOUNTER — Telehealth: Payer: Self-pay | Admitting: *Deleted

## 2018-04-19 MED ORDER — POTASSIUM CHLORIDE ER 10 MEQ PO TBCR: 10.0000 meq | EXTENDED_RELEASE_TABLET | Freq: Every day | ORAL | 5 refills | Status: DC

## 2018-04-19 NOTE — Telephone Encounter (Signed)
Fax received  K-DUR has been discontinued by the manufacturer. Please provide a new prescription for replacement. Possible alternative is KLOR-CON M10

## 2018-04-20 DIAGNOSIS — E876 Hypokalemia: Secondary | ICD-10-CM | POA: Diagnosis not present

## 2018-04-21 LAB — CMP14+EGFR
ALT: 14 IU/L (ref 0–32)
AST: 20 IU/L (ref 0–40)
Albumin/Globulin Ratio: 2.2 (ref 1.2–2.2)
Albumin: 4.7 g/dL (ref 3.8–4.8)
Alkaline Phosphatase: 95 IU/L (ref 39–117)
BUN/Creatinine Ratio: 7 — ABNORMAL LOW (ref 12–28)
BUN: 6 mg/dL — AB (ref 8–27)
Bilirubin Total: 0.2 mg/dL (ref 0.0–1.2)
CO2: 23 mmol/L (ref 20–29)
CREATININE: 0.81 mg/dL (ref 0.57–1.00)
Calcium: 9.9 mg/dL (ref 8.7–10.3)
Chloride: 100 mmol/L (ref 96–106)
GFR calc Af Amer: 90 mL/min/{1.73_m2} (ref >59)
GFR calc non Af Amer: 78 mL/min/{1.73_m2} (ref >59)
GLUCOSE: 85 mg/dL (ref 65–99)
Globulin, Total: 2.1 g/dL (ref 1.5–4.5)
Potassium: 4 mmol/L (ref 3.5–5.2)
Sodium: 145 mmol/L — ABNORMAL HIGH (ref 134–144)
Total Protein: 6.8 g/dL (ref 6.0–8.5)

## 2018-04-21 LAB — MAGNESIUM: MAGNESIUM: 1 mg/dL — AB (ref 1.6–2.3)

## 2018-04-23 ENCOUNTER — Other Ambulatory Visit: Payer: Self-pay | Admitting: Family Medicine

## 2018-04-23 DIAGNOSIS — E870 Hyperosmolality and hypernatremia: Secondary | ICD-10-CM

## 2018-04-23 DIAGNOSIS — E876 Hypokalemia: Secondary | ICD-10-CM

## 2018-04-23 MED ORDER — MAGNESIUM LACTATE 84 MG (7MEQ) PO TBCR: 168.0000 mg | EXTENDED_RELEASE_TABLET | Freq: Two times a day (BID) | ORAL | 0 refills | Status: AC

## 2018-04-26 ENCOUNTER — Other Ambulatory Visit: Payer: Self-pay

## 2018-04-26 ENCOUNTER — Telehealth: Payer: Self-pay

## 2018-04-26 ENCOUNTER — Other Ambulatory Visit: Payer: Self-pay | Admitting: Family Medicine

## 2018-04-26 DIAGNOSIS — M549 Dorsalgia, unspecified: Secondary | ICD-10-CM

## 2018-04-26 MED ORDER — GABAPENTIN 600 MG PO TABS: 600.0000 mg | ORAL_TABLET | Freq: Two times a day (BID) | ORAL | 1 refills | Status: DC

## 2018-04-26 MED ORDER — TRAMADOL HCL 50 MG PO TABS: 50.0000 mg | ORAL_TABLET | Freq: Two times a day (BID) | ORAL | 1 refills | Status: AC

## 2018-04-26 NOTE — Telephone Encounter (Signed)
Thirty days with one refill is sent

## 2018-04-26 NOTE — Telephone Encounter (Signed)
Kelly Douglas is requesting a refill of tramadol be sent to [facility]

## 2018-04-26 NOTE — Addendum Note (Signed)
Addended by: Eual Fines on: 04/26/2018 10:48 AM   Modules accepted: Orders

## 2018-04-30 ENCOUNTER — Ambulatory Visit: Payer: Medicare HMO | Admitting: Emergency Medicine

## 2018-05-14 ENCOUNTER — Other Ambulatory Visit: Payer: Self-pay

## 2018-05-14 MED ORDER — POTASSIUM CHLORIDE ER 10 MEQ PO TBCR: 10.0000 meq | EXTENDED_RELEASE_TABLET | Freq: Every day | ORAL | 3 refills | Status: DC

## 2018-05-14 MED ORDER — AMLODIPINE BESYLATE 5 MG PO TABS: 5.0000 mg | ORAL_TABLET | Freq: Every day | ORAL | 3 refills | Status: DC

## 2018-05-22 ENCOUNTER — Other Ambulatory Visit: Payer: Self-pay

## 2018-05-22 ENCOUNTER — Ambulatory Visit: Payer: Medicare HMO | Admitting: Family Medicine

## 2018-05-24 ENCOUNTER — Other Ambulatory Visit: Payer: Self-pay | Admitting: Family Medicine

## 2018-05-24 DIAGNOSIS — R252 Cramp and spasm: Secondary | ICD-10-CM | POA: Diagnosis not present

## 2018-05-24 DIAGNOSIS — I10 Essential (primary) hypertension: Secondary | ICD-10-CM | POA: Diagnosis not present

## 2018-05-24 DIAGNOSIS — E876 Hypokalemia: Secondary | ICD-10-CM | POA: Diagnosis not present

## 2018-05-24 DIAGNOSIS — E785 Hyperlipidemia, unspecified: Secondary | ICD-10-CM | POA: Diagnosis not present

## 2018-05-25 LAB — COMPREHENSIVE METABOLIC PANEL
ALT: 28 IU/L (ref 0–32)
AST: 32 IU/L (ref 0–40)
Albumin/Globulin Ratio: 1.9 (ref 1.2–2.2)
Albumin: 5 g/dL — ABNORMAL HIGH (ref 3.8–4.8)
Alkaline Phosphatase: 83 IU/L (ref 39–117)
BUN/Creatinine Ratio: 7 — ABNORMAL LOW (ref 12–28)
BUN: 6 mg/dL — ABNORMAL LOW (ref 8–27)
Bilirubin Total: 0.3 mg/dL (ref 0.0–1.2)
CO2: 25 mmol/L (ref 20–29)
Calcium: 10.6 mg/dL — ABNORMAL HIGH (ref 8.7–10.3)
Chloride: 97 mmol/L (ref 96–106)
Creatinine, Ser: 0.88 mg/dL (ref 0.57–1.00)
GFR calc Af Amer: 81 mL/min/{1.73_m2} (ref >59)
GFR calc non Af Amer: 71 mL/min/{1.73_m2} (ref >59)
Globulin, Total: 2.6 g/dL (ref 1.5–4.5)
Glucose: 96 mg/dL (ref 65–99)
Potassium: 4.9 mmol/L (ref 3.5–5.2)
Sodium: 138 mmol/L (ref 134–144)
Total Protein: 7.6 g/dL (ref 6.0–8.5)

## 2018-05-25 LAB — SPECIMEN STATUS REPORT

## 2018-05-25 LAB — LIPID PANEL W/O CHOL/HDL RATIO
Cholesterol, Total: 147 mg/dL (ref 100–199)
HDL: 38 mg/dL — ABNORMAL LOW (ref >39)
LDL Calculated: 55 mg/dL (ref 0–99)
Triglycerides: 272 mg/dL — ABNORMAL HIGH (ref 0–149)
VLDL Cholesterol Cal: 54 mg/dL — ABNORMAL HIGH (ref 5–40)

## 2018-05-25 LAB — PARATHYROID HORMONE, INTACT (NO CA): PTH: 23 pg/mL (ref 15–65)

## 2018-05-25 LAB — MAGNESIUM: Magnesium: 2 mg/dL (ref 1.6–2.3)

## 2018-05-25 LAB — TSH: TSH: 1.88 u[IU]/mL (ref 0.450–4.500)

## 2018-05-31 ENCOUNTER — Ambulatory Visit (INDEPENDENT_AMBULATORY_CARE_PROVIDER_SITE_OTHER): Payer: Medicare Other | Admitting: Family Medicine

## 2018-05-31 ENCOUNTER — Other Ambulatory Visit: Payer: Self-pay

## 2018-05-31 VITALS — BP 142/87 | Ht 68.0 in | Wt 151.0 lb

## 2018-05-31 DIAGNOSIS — Z1231 Encounter for screening mammogram for malignant neoplasm of breast: Secondary | ICD-10-CM

## 2018-05-31 DIAGNOSIS — F17218 Nicotine dependence, cigarettes, with other nicotine-induced disorders: Secondary | ICD-10-CM

## 2018-05-31 DIAGNOSIS — F329 Major depressive disorder, single episode, unspecified: Secondary | ICD-10-CM

## 2018-05-31 DIAGNOSIS — F5104 Psychophysiologic insomnia: Secondary | ICD-10-CM | POA: Diagnosis not present

## 2018-05-31 DIAGNOSIS — E038 Other specified hypothyroidism: Secondary | ICD-10-CM

## 2018-05-31 DIAGNOSIS — F17210 Nicotine dependence, cigarettes, uncomplicated: Secondary | ICD-10-CM | POA: Diagnosis not present

## 2018-05-31 DIAGNOSIS — F419 Anxiety disorder, unspecified: Secondary | ICD-10-CM

## 2018-05-31 DIAGNOSIS — I10 Essential (primary) hypertension: Secondary | ICD-10-CM | POA: Diagnosis not present

## 2018-05-31 DIAGNOSIS — E782 Mixed hyperlipidemia: Secondary | ICD-10-CM | POA: Diagnosis not present

## 2018-05-31 DIAGNOSIS — F32A Depression, unspecified: Secondary | ICD-10-CM

## 2018-05-31 DIAGNOSIS — Z9109 Other allergy status, other than to drugs and biological substances: Secondary | ICD-10-CM

## 2018-05-31 DIAGNOSIS — R918 Other nonspecific abnormal finding of lung field: Secondary | ICD-10-CM

## 2018-05-31 MED ORDER — TRAZODONE HCL 50 MG PO TABS: 25.0000 mg | ORAL_TABLET | Freq: Every day | ORAL | 3 refills | Status: DC

## 2018-05-31 MED ORDER — MONTELUKAST SODIUM 10 MG PO TABS: 10.0000 mg | ORAL_TABLET | Freq: Every day | ORAL | 1 refills | Status: DC

## 2018-05-31 NOTE — Patient Instructions (Addendum)
Wellness overdue please schedule   Mammogram to be scheduled for early August  Office visit with MD re address anxiety and lack of sleep, new changes in medication  Blood work is very good, but you need to reduce fried and fatty foods and cheese as triglycerides are high, as  this increases heart disease risk  Increase pill for anxiety, buspar 10 mg to one and a half tablets three times daily  New medication trazodone is sent in for sleep      Steps to Quit Smoking  Smoking tobacco can be bad for your health. It can also affect almost every organ in your body. Smoking puts you and people around you at risk for many serious long-lasting (chronic) diseases. Quitting smoking is hard, but it is one of the best things that you can do for your health. It is never too late to quit. What are the benefits of quitting smoking? When you quit smoking, you lower your risk for getting serious diseases and conditions. They can include:  Lung cancer or lung disease.  Heart disease.  Stroke.  Heart attack.  Not being able to have children (infertility).  Weak bones (osteoporosis) and broken bones (fractures). If you have coughing, wheezing, and shortness of breath, those symptoms may get better when you quit. You may also get sick less often. If you are pregnant, quitting smoking can help to lower your chances of having a baby of low birth weight. What can I do to help me quit smoking? Talk with your doctor about what can help you quit smoking. Some things you can do (strategies) include:  Quitting smoking totally, instead of slowly cutting back how much you smoke over a period of time.  Going to in-person counseling. You are more likely to quit if you go to many counseling sessions.  Using resources and support systems, such as: ? Database administrator with a Social worker. ? Phone quitlines. ? Careers information officer. ? Support groups or group counseling. ? Text messaging programs. ? Mobile  phone apps or applications.  Taking medicines. Some of these medicines may have nicotine in them. If you are pregnant or breastfeeding, do not take any medicines to quit smoking unless your doctor says it is okay. Talk with your doctor about counseling or other things that can help you. Talk with your doctor about using more than one strategy at the same time, such as taking medicines while you are also going to in-person counseling. This can help make quitting easier. What things can I do to make it easier to quit? Quitting smoking might feel very hard at first, but there is a lot that you can do to make it easier. Take these steps:  Talk to your family and friends. Ask them to support and encourage you.  Call phone quitlines, reach out to support groups, or work with a Social worker.  Ask people who smoke to not smoke around you.  Avoid places that make you want (trigger) to smoke, such as: ? Bars. ? Parties. ? Smoke-break areas at work.  Spend time with people who do not smoke.  Lower the stress in your life. Stress can make you want to smoke. Try these things to help your stress: ? Getting regular exercise. ? Deep-breathing exercises. ? Yoga. ? Meditating. ? Doing a body scan. To do this, close your eyes, focus on one area of your body at a time from head to toe, and notice which parts of your body are tense. Try to  relax the muscles in those areas.  Download or buy apps on your mobile phone or tablet that can help you stick to your quit plan. There are many free apps, such as QuitGuide from the [employer] Office manager for Disease Control and Prevention). You can find more support from smokefree.gov and other websites. This information is not intended to replace advice given to you by your health care provider. Make sure you discuss any questions you have with your health care provider. Document Released: 10/30/2008 Document Revised: 09/01/2015 Document Reviewed: 05/20/2014 Elsevier Interactive  Patient Education  2019 [employer].

## 2018-05-31 NOTE — Progress Notes (Signed)
Virtual Visit via Telephone Note  I connected with [place] on 05/31/18 at  8:00 AM EDT by telephone and verified that I am speaking with the correct person using two identifiers.  Location: Patient: home  Provider: office   I discussed the limitations, risks, security and privacy concerns of performing an evaluation and management service by telephone and the availability of in person appointments. I also discussed with the patient that there may be a patient responsible charge related to this service. The patient expressed understanding and agreed to proceed.  This visit type is conducted due to national recommendations for restrictions regarding the COVID -19 Pandemic. Due to the patient's age and / or co morbidities, this format is felt to be most appropriate at this time without adequate follow up. The patient has no access to video technology/ had technical difficulties with video, requiring transitioning to audio format  only ( telephone ). All issues noted this document were discussed and addressed,no physical exam can be performed in this format.    History of Present Illness: F/U chronic problems Head congestion x 5 days, cannot get anything out when she blow her nose, no fever , ear pain, cough or chills Increased stress and anxiety as her husband's health deteriorates, however she knows and  Does deal with this the best that she is able. Has had recent episodes of loss of consciousness, which occur when she  Is extremely stressed Still smoking though would like to quit does not think that it is possible now Denies chest congestion, productive cough chronic shortness of breath, no  wheezing. Denies chest pains, palpitations and leg swelling Denies abdominal pain, nausea, vomiting,diarrhea or constipation.   Denies dysuria, frequency, hesitancy or incontinence. Chronic back pain and limitation in mobility  Denies skin break down or rash. C/o extremely poor  sleep       Observations/Objective: BP (!) 142/87   Ht 5\' 8"  (1.727 m)   Wt 151 lb (68.5 kg)   BMI 22.96 kg/m  Good communication with no confusion and intact memory. Alert and oriented x 3 No signs of respiratory distress during sppech    Assessment and Plan:  Essential hypertension unControlled, no change in medication, will re address DASH diet and commitment to daily physical activity for a minimum of 30 minutes discussed and encouraged, as a part of hypertension management. The importance of attaining a healthy weight is also discussed.  BP/Weight 05/31/2018 03/29/2018 03/28/2018 01/30/2018 01/29/2018 12/19/2017 33/82/5053  Systolic BP 976 734 - 193 790 240 973  Diastolic BP 87 78 - 87 70 85 78  Wt. (Lbs) 151 166.45 - 151 151 155.12 154.8  BMI 22.96 - 25.31 22.96 22.96 23.59 23.54       Hyperlipidemia Hyperlipidemia:Low fat diet discussed and encouraged.   Lipid Panel  Lab Results  Component Value Date   CHOL 147 05/24/2018   HDL 38 (L) 05/24/2018   LDLCALC 55 05/24/2018   TRIG 272 (H) 05/24/2018   CHOLHDL 3.2 12/18/2017  needs to reduce fried and fatty food and increase  exercise     Nicotine dependence Asked:confirms currently smokes approx 15 cigarettes/ day Assess: Unwilling to quit but cutting back Advise: needs to QUIT to reduce risk of cancer, cardio and cerebrovascular disease Assist: counseled for 5 minutes and literature provided Arrange: follow up in 3 months   Hypothyroid Adequately corrected , no change in medication dose  Anxiety and depression Increased anxiety due to failing health of her spouse,increase buspar to  1.5 tabs three times daily, therapy offered but she declined  Insomnia Reports very poor sleep and chronicf atigue, will start trazodone Sleep hygiene reviewed and written information offered also. Prescription sent for  medication needed.   Pollen allergies Increased and uncontrolled symptoms,additional medication to  be started and saline nasal flushes daily  Pulmonary nodules F/U with pulmonary due , will refer   Follow Up Instructions:    I discussed the assessment and treatment plan with the patient. The patient was provided an opportunity to ask questions and all were answered. The patient agreed with the plan and demonstrated an understanding of the instructions.   The patient was advised to call back or seek an in-person evaluation if the symptoms worsen or if the condition fails to improve as anticipated.  I provided 30 minutes of non-face-to-face time during this encounter.   Tula Nakayama, MD

## 2018-06-01 ENCOUNTER — Encounter: Payer: Self-pay | Admitting: Family Medicine

## 2018-06-01 NOTE — Assessment & Plan Note (Signed)
Asked:confirms currently smokes approx 15 cigarettes/ day Assess: Unwilling to quit but cutting back Advise: needs to QUIT to reduce risk of cancer, cardio and cerebrovascular disease Assist: counseled for 5 minutes and literature provided Arrange: follow up in 3 months

## 2018-06-01 NOTE — Assessment & Plan Note (Signed)
Increased and uncontrolled symptoms,additional medication to be started and saline nasal flushes daily

## 2018-06-01 NOTE — Assessment & Plan Note (Addendum)
unControlled, no change in medication, will re address DASH diet and commitment to daily physical activity for a minimum of 30 minutes discussed and encouraged, as a part of hypertension management. The importance of attaining a healthy weight is also discussed.  BP/Weight 05/31/2018 03/29/2018 03/28/2018 01/30/2018 01/29/2018 12/19/2017 97/94/8016  Systolic BP 553 748 - 270 786 754 492  Diastolic BP 87 78 - 87 70 85 78  Wt. (Lbs) 151 166.45 - 151 151 155.12 154.8  BMI 22.96 - 25.31 22.96 22.96 23.59 23.54

## 2018-06-01 NOTE — Assessment & Plan Note (Signed)
F/U with pulmonary due , will refer

## 2018-06-01 NOTE — Assessment & Plan Note (Signed)
Reports very poor sleep and chronicf atigue, will start trazodone Sleep hygiene reviewed and written information offered also. Prescription sent for  medication needed.

## 2018-06-01 NOTE — Assessment & Plan Note (Signed)
Adequately corrected , no change in medication dose

## 2018-06-01 NOTE — Assessment & Plan Note (Signed)
Hyperlipidemia:Low fat diet discussed and encouraged.   Lipid Panel  Lab Results  Component Value Date   CHOL 147 05/24/2018   HDL 38 (L) 05/24/2018   LDLCALC 55 05/24/2018   TRIG 272 (H) 05/24/2018   CHOLHDL 3.2 12/18/2017  needs to reduce fried and fatty food and increase  exercise

## 2018-06-01 NOTE — Assessment & Plan Note (Addendum)
Increased anxiety due to failing health of her spouse,increase buspar to 1.5 tabs three times daily, therapy offered but she declined

## 2018-06-25 ENCOUNTER — Telehealth: Payer: Self-pay | Admitting: Emergency Medicine

## 2018-06-25 NOTE — Telephone Encounter (Signed)
Pt has been advised.  Plan  Patient Instructions  Follow up CT Chest in 6 months .  Need to follow up with PCP regarding enlarged lymph nodes under your arm( axillary lymph nodes )  Follow up with dentist as discussed  Work on not smoking .  Albuterol Inhaler As needed  Wheezing .  Follow up in 3 months with Dr. Lamonte Sakai and As needed   Please contact office for sooner follow up if symptoms do not improve or worsen or seek emergency care     Pt inquiring about recheck of lymph nodes Per her last visit she say RB was going to make pcp aware.     Based on the fact she has multiple nodules I think that best strategy would be to attempt biopsy via navigational bronchoscopy.  I considered pursuing axillary lymph node biopsy(ies) but I think it would be quite unusual for those notes to be so enlarged and with a significant amount of hypermetabolism given the size and minimal hypermetabolism of the nodules themselves.  I suspect that these are different processes.  We will try to arrange for navigational bronchoscopy next week

## 2018-07-05 ENCOUNTER — Encounter (INDEPENDENT_AMBULATORY_CARE_PROVIDER_SITE_OTHER): Payer: Self-pay

## 2018-07-05 ENCOUNTER — Encounter: Payer: Self-pay | Admitting: Family Medicine

## 2018-07-05 ENCOUNTER — Ambulatory Visit (INDEPENDENT_AMBULATORY_CARE_PROVIDER_SITE_OTHER): Payer: Medicare Other | Admitting: Family Medicine

## 2018-07-05 ENCOUNTER — Other Ambulatory Visit: Payer: Self-pay

## 2018-07-05 VITALS — BP 120/70 | HR 87 | Temp 98.7°F | Resp 12 | Ht 68.0 in | Wt 153.0 lb

## 2018-07-05 DIAGNOSIS — Z0000 Encounter for general adult medical examination without abnormal findings: Secondary | ICD-10-CM | POA: Diagnosis not present

## 2018-07-05 NOTE — Progress Notes (Signed)
Subjective:   Kelly Douglas is a 63 y.o. female who presents for Medicare Annual (Subsequent) preventive examination.   Review of Systems:    Cardiac Risk Factors include: hypertension;smoking/ tobacco exposure     Objective:     Vitals: BP 120/70   Pulse 87   Temp 98.7 F (37.1 C) (Temporal)   Resp 12   Ht 5\' 8"  (1.727 m)   Wt 153 lb 0.6 oz (69.4 kg)   SpO2 100%   BMI 23.27 kg/m   Body mass index is 23.27 kg/m.  Advanced Directives 03/29/2018 03/28/2018 10/18/2017 07/03/2017 11/02/2016 03/01/2016 04/23/2014  Does Patient Have a Medical Advance Directive? No No No Yes No No No  Does patient want to make changes to medical advance directive? - - - Yes (MAU/Ambulatory/Procedural Areas - Information given) - - -  Would patient like information on creating a medical advance directive? No - Patient declined - Yes (MAU/Ambulatory/Procedural Areas - Information given) - No - Patient declined Yes (MAU/Ambulatory/Procedural Areas - Information given) Yes - Educational materials given  Pre-existing out of facility DNR order (yellow form or pink MOST form) - - - - - - -    Tobacco Social History   Tobacco Use  Smoking Status Current Every Day Smoker  . Packs/day: 0.50  . Years: 45.00  . Pack years: 22.50  . Types: Cigarettes  . Start date: 07/14/1971  Smokeless Tobacco Never Used  Tobacco Comment   0.5 PPD current plans to reduce      Ready to quit: No Counseling given: Yes Comment: 0.5 PPD current plans to reduce    Clinical Intake:  Pre-visit preparation completed: Yes  Pain : 0-10 Pain Score: 7  Pain Type: Chronic pain Pain Location: Hip Pain Orientation: Right, Left Pain Descriptors / Indicators: Nagging, Aching Pain Onset: More than a month ago Pain Frequency: Constant Pain Relieving Factors: sitting down, changing positions Effect of Pain on Daily Activities: yes  Pain Relieving Factors: sitting down, changing positions  BMI - recorded: 23.27 Nutritional  Status: BMI of 19-24  Normal Nutritional Risks: None Diabetes: No  How often do you need to have someone help you when you read instructions, pamphlets, or other written materials from your doctor or pharmacy?: 1 - Never What is the last grade level you completed in school?: 12        Past Medical History:  Diagnosis Date  . Anxiety   . Arteriosclerotic cardiovascular disease (ASCVD) 2013   coronary calcification-left main, LAD and CX; small pericardial effusion  . Arthritis   . Chronic back pain   . Chronic bronchitis   . Chronic lung disease    Chronic scarring and volume loss-left lung; characteristics of a chronic infectious process-possible MAI  . Depression   . Dyspnea   . GERD (gastroesophageal reflux disease)    Dr Gala Romney EGD 09/2009->esophagitis, sm HH, antral erosions, atonic esophagus  . Hyperlipidemia   . Hypertension   . Hypothyroid 1981 approx  . Scleroderma ([place])   . Seasonal allergies   . Syncope    Multiple spells over the past 40+ years, likely neurocardiogenic  . Tobacco abuse 06/24/2009   Past Surgical History:  Procedure Laterality Date  . BILATERAL SALPINGOOPHORECTOMY  2013    Dr. Elonda Husky; uterus remains in situ  . COLONOSCOPY  09/28/09   anal papilla otherwise normal  . COLONOSCOPY N/A 08/29/2012   [id number]:[id number] polyp-removed as described above. tubular adenoma  . ESOPHAGOGASTRODUODENOSCOPY  10/07/09   Dr. Barrie Dunker of  esophageal mucosa, diffusely ?esophagitis (bx benign), small HH/antral erosions, erythema bx benign. 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 ESOPHAGOGASTRODUODENOSCOPY (EGD) WITH ESOPHAGEAL DILATION N/A 03/07/2012   [id number], patent, tubular esophagus of uncertain significance-status post biopsy )unremarkable). Hiatal hernia  . FUDUCIAL PLACEMENT  10/18/2017   Procedure: PLACEMENT OF 3 FUDUCIAL INTO LEFT LOWER LOBE OF LUNG-TARGET 1;  Surgeon: Collene Gobble, MD;  Location: [facility];  Service: Thoracic;;  . LAPAROSCOPIC  APPENDECTOMY  07/16/2011   Procedure: APPENDECTOMY LAPAROSCOPIC;  Surgeon: Donato Heinz, MD;  Location: [facility];  Service: General;  Laterality: N/A;  . TUBAL LIGATION    . VIDEO BRONCHOSCOPY  10/18/2017  . VIDEO BRONCHOSCOPY WITH ENDOBRONCHIAL NAVIGATION N/A 10/18/2017   Procedure: VIDEO BRONCHOSCOPY WITH ENDOBRONCHIAL NAVIGATION;  Surgeon: Collene Gobble, MD;  Location: [facility];  Service: Thoracic;  Laterality: N/A;   Family History  Problem Relation Age of Onset  . Stroke Father   . Aneurysm Father   . Coronary artery disease Brother   . Hypertension Brother   . Down syndrome Brother   . Hypertension Brother   . Hypertension Brother        father/mother  . Aneurysm Mother   . Throat cancer Brother 49       throat cancer  . Hypertension Sister   . Diabetes Brother   . Diabetes Brother   . Anesthesia problems Neg Hx   . Hypotension Neg Hx   . Malignant hyperthermia Neg Hx   . Pseudochol deficiency Neg Hx   . Colon cancer Neg Hx    Social History   Socioeconomic History  . Marital status: Married    Spouse name: Not on file  . Number of children: 2  . Years of education: Not on file  . Highest education level: Not on file  Occupational History  . Occupation: disabled    Fish farm manager: NOT EMPLOYED  Social Needs  . Financial resource strain: Very hard  . Food insecurity    Worry: Sometimes true    Inability: Sometimes true  . Transportation needs    Medical: No    Non-medical: No  Tobacco Use  . Smoking status: Current Every Day Smoker    Packs/day: 0.50    Years: 45.00    Pack years: 22.50    Types: Cigarettes    Start date: 07/14/1971  . Smokeless tobacco: Never Used  . Tobacco comment: 0.5 PPD current plans to reduce   Substance and Sexual Activity  . Alcohol use: No    Alcohol/week: 0.0 standard drinks    Comment: quit June 2012-used to drink 6 pack daily  . Drug use: No  . Sexual activity: Yes    Birth control/protection: Post-menopausal  Lifestyle  .  Physical activity    Days per week: 2 days    Minutes per session: 20 min  . Stress: Not at all  Relationships  . Social Herbalist on phone: Never    Gets together: More than three times a week    Attends religious service: More than 4 times per year    Active member of club or organization: No    Attends meetings of clubs or organizations: Never    Relationship status: Married  Other Topics Concern  . Not on file  Social History Narrative  . Not on file    Outpatient Encounter Medications as of 07/05/2018  Medication Sig  . acetaminophen (TYLENOL) 650 MG CR tablet Take 1,300 mg by mouth 2 (two)  times daily.   Marland Kitchen amLODipine (NORVASC) 5 MG tablet Take 1 tablet (5 mg total) by mouth daily.  . busPIRone (BUSPAR) 10 MG tablet One and a half tablets three times daily  . cholecalciferol (VITAMIN D) 1000 units tablet Take 1,000 Units by mouth daily.  . DULoxetine (CYMBALTA) 60 MG capsule Take 1 capsule (60 mg total) by mouth daily.  Marland Kitchen gabapentin (NEURONTIN) 600 MG tablet Take 1 tablet (600 mg total) by mouth 2 (two) times daily.  Marland Kitchen levothyroxine (SYNTHROID, LEVOTHROID) 75 MCG tablet Take 0.5-1 tablets (37.5-75 mcg total) by mouth See admin instructions. Alternate taking 75 mcg and 37.5 mcg once daily  . Lidocaine-Menthol (ICY HOT LIDOCAINE PLUS MENTHOL) 4-1 % PTCH Apply 1 patch topically daily as needed (pain).  . Magnesium 500 MG CAPS Take 1 capsule by mouth daily.  . meloxicam (MOBIC) 7.5 MG tablet Take 1 tablet (7.5 mg total) by mouth daily.  . pantoprazole (PROTONIX) 40 MG tablet Take 1 tablet (40 mg total) by mouth 2 (two) times daily.  . potassium chloride (KLOR-CON 10) 10 MEQ tablet Take 1 tablet (10 mEq total) by mouth daily.  . pravastatin (PRAVACHOL) 40 MG tablet Take 1 tablet (40 mg total) by mouth daily.  Marland Kitchen PROAIR RESPICLICK 588 (90 Base) MCG/ACT AEPB Inhale 2 puffs into the lungs 4 (four) times daily as needed for wheezing or shortness of breath.  . traZODone  (DESYREL) 50 MG tablet Take 0.5-1 tablets (25-50 mg total) by mouth at bedtime.  . [DISCONTINUED] montelukast (SINGULAIR) 10 MG tablet Take 1 tablet (10 mg total) by mouth at bedtime. (Patient not taking: Reported on 07/05/2018)   No facility-administered encounter medications on file as of 07/05/2018.     Activities of Daily Living In your present state of health, do you have any difficulty performing the following activities: 07/05/2018 03/29/2018  Hearing? N N  Vision? Y N  Difficulty concentrating or making decisions? Y N  Comment sometimes -  Walking or climbing stairs? Y N  Dressing or bathing? N N  Doing errands, shopping? Y N  Comment can go to the doctors alone but grocery shopping she ask someone to go with her -  Preparing Food and eating ? N -  Using the Toilet? N -  In the past six months, have you accidently leaked urine? N -  Do you have problems with loss of bowel control? Y -  Managing your Medications? N -  Managing your Finances? N -  Housekeeping or managing your Housekeeping? N -  Comment but has to take breaks because of back and hip pain -  Some recent data might be hidden    Patient Care Team: [name], MD as PCP - General Rothbart, [name], MD (Cardiology) Gala Romney [name], MD (Gastroenterology) Teena Irani, PTA as Physical Therapy Assistant (Physical Therapy) Leta Baptist, MD as Consulting Physician (Otolaryngology)    Assessment:   This is a routine wellness examination for Kelly Douglas.  Exercise Activities and Dietary recommendations Current Exercise Habits: Home exercise routine, Type of exercise: strength training/weights;calisthenics, Time (Minutes): 30, Frequency (Times/Week): 2, Weekly Exercise (Minutes/Week): 60, Intensity: Mild, Exercise limited by: orthopedic condition(s)  Goals    . Quit smoking / using tobacco     Patient would like to quit smoking. Recommend smoking cessation classes.       Fall Risk Fall Risk  07/05/2018 05/31/2018  12/19/2017 07/03/2017 03/28/2017  Falls in the past year? 1 1 0 No No  Number falls in  past yr: 1 1 0 - -  Injury with Fall? 0 0 0 - -  Risk Factor Category  - - - - -  Risk for fall due to : - - - - -   Is the patient's home free of loose throw rugs in walkways, pet beds, electrical cords, etc?   yes      Grab bars in the bathroom? no      Handrails on the stairs?   no stairs      Adequate lighting?   yes     Depression Screen PHQ 2/9 Scores 07/05/2018 12/19/2017 07/03/2017 03/29/2017  PHQ - 2 Score 0 0 0 3  PHQ- 9 Score - - - 12     Cognitive Function     6CIT Screen 07/05/2018 07/03/2017 03/01/2016  What Year? 0 points 0 points 0 points  What month? 0 points 0 points 0 points  What time? 0 points 0 points 0 points  Count back from 20 0 points 0 points 0 points  Months in reverse 0 points 0 points 0 points  Repeat phrase 0 points 0 points 0 points  Total Score 0 0 0    Immunization History  Administered Date(s) Administered  . Influenza Split 10/12/2011  . Influenza Whole 10/13/2009  . Influenza,inj,Quad PF,6+ Mos 11/01/2012, 09/16/2013, 11/20/2014, 10/27/2015, 11/28/2016, 09/26/2017  . Pneumococcal Conjugate-13 12/20/2013  . Td 06/24/2009  . Tdap 11/17/2010    Qualifies for Shingles Vaccine? Check with insurance on coverage  Screening Tests Health Maintenance  Topic Date Due  . INFLUENZA VACCINE  08/18/2018  . MAMMOGRAM  08/15/2019  . PAP SMEAR-Modifier  11/29/2019  . TETANUS/TDAP  11/16/2020  . COLONOSCOPY  08/30/2022  . Hepatitis C Screening  Completed  . HIV Screening  Completed    Cancer Screenings: Lung: Low Dose CT Chest recommended if Age 44-80 years, 30 pack-year currently smoking OR have quit w/in 15years. Patient does qualify. Breast:  Up to date on Mammogram? Yes   Up to date of Bone Density/Dexa? N/a  Colorectal:  Due 2024  Additional Screenings:   Hepatitis C Screening: completed    Plan:      1. Encounter for Medicare annual wellness exam    I have personally reviewed and noted the following in the patient's chart:   . Medical and social history . Use of alcohol, tobacco or illicit drugs  . Current medications and supplements . Functional ability and status . Nutritional status . Physical activity . Advanced directives . List of other physicians . Hospitalizations, surgeries, and ER visits in previous 12 months . Vitals . Screenings to include cognitive, depression, and falls . Referrals and appointments  In addition, I have reviewed and discussed with patient certain preventive protocols, quality metrics, and best practice recommendations. A written personalized care plan for preventive services as well as general preventive health recommendations were provided to patient.     Perlie Mayo, NP  07/05/2018

## 2018-07-05 NOTE — Patient Instructions (Signed)
Kelly Douglas , Thank you for taking time to come for your Medicare Wellness Visit. I appreciate your ongoing commitment to your health goals. Please review the following plan we discussed and let me know if I can assist you in the future.   Screening recommendations/referrals: Colonoscopy: Due in 2024 (per chart) Mammogram: July 2020 Bone Density: Schedule in 3 years  Recommended yearly ophthalmology/optometry visit for glaucoma screening and checkup Recommended yearly dental visit for hygiene and checkup  Vaccinations: Influenza vaccine: Due Fall 2020 Pneumococcal vaccine: Needs 23 Tdap vaccine: Due 2022 Shingles vaccine: Check with insurance     Advanced directives: Does not have. Family is aware of what she wants.   Conditions/risks identified: Smoking  Next appointment: 07/16/2018  Preventive Care 40-64 Years, Female Preventive care refers to lifestyle choices and visits with your health care provider that can promote health and wellness. What does preventive care include?  A yearly physical exam. This is also called an annual well check.  Dental exams once or twice a year.  Routine eye exams. Ask your health care provider how often you should have your eyes checked.  Personal lifestyle choices, including:  Daily care of your teeth and gums.  Regular physical activity.  Eating a healthy diet.  Avoiding tobacco and drug use.  Limiting alcohol use.  Practicing safe sex.  Taking low-dose aspirin daily starting at age 90.  Taking vitamin and mineral supplements as recommended by your health care provider. What happens during an annual well check? The services and screenings done by your health care provider during your annual well check will depend on your age, overall health, lifestyle risk factors, and family history of disease. Counseling  Your health care provider may ask you questions about your:  Alcohol use.  Tobacco use.  Drug use.  Emotional  well-being.  Home and relationship well-being.  Sexual activity.  Eating habits.  Work and work Statistician.  Method of birth control.  Menstrual cycle.  Pregnancy history. Screening  You may have the following tests or measurements:  Height, weight, and BMI.  Blood pressure.  Lipid and cholesterol levels. These may be checked every 5 years, or more frequently if you are over 41 years old.  Skin check.  Lung cancer screening. You may have this screening every year starting at age 68 if you have a 30-pack-year history of smoking and currently smoke or have quit within the past 15 years.  Fecal occult blood test (FOBT) of the stool. You may have this test every year starting at age 49.  Flexible sigmoidoscopy or colonoscopy. You may have a sigmoidoscopy every 5 years or a colonoscopy every 10 years starting at age 70.  Hepatitis C blood test.  Hepatitis B blood test.  Sexually transmitted disease (STD) testing.  Diabetes screening. This is done by checking your blood sugar (glucose) after you have not eaten for a while (fasting). You may have this done every 1-3 years.  Mammogram. This may be done every 1-2 years. Talk to your health care provider about when you should start having regular mammograms. This may depend on whether you have a family history of breast cancer.  BRCA-related cancer screening. This may be done if you have a family history of breast, ovarian, tubal, or peritoneal cancers.  Pelvic exam and Pap test. This may be done every 3 years starting at age 39. Starting at age 71, this may be done every 5 years if you have a Pap test in combination with an  HPV test.  Bone density scan. This is done to screen for osteoporosis. You may have this scan if you are at high risk for osteoporosis. Discuss your test results, treatment options, and if necessary, the need for more tests with your health care provider. Vaccines  Your health care provider may recommend  certain vaccines, such as:  Influenza vaccine. This is recommended every year.  Tetanus, diphtheria, and acellular pertussis (Tdap, Td) vaccine. You may need a Td booster every 10 years.  Zoster vaccine. You may need this after age 3.  Pneumococcal 13-valent conjugate (PCV13) vaccine. You may need this if you have certain conditions and were not previously vaccinated.  Pneumococcal polysaccharide (PPSV23) vaccine. You may need one or two doses if you smoke cigarettes or if you have certain conditions. Talk to your health care provider about which screenings and vaccines you need and how often you need them. This information is not intended to replace advice given to you by your health care provider. Make sure you discuss any questions you have with your health care provider. Document Released: 01/30/2015 Document Revised: 09/23/2015 Document Reviewed: 11/04/2014 Elsevier Interactive Patient Education  2017 [place] Prevention in the Home Falls can cause injuries. They can happen to people of all ages. There are many things you can do to make your home safe and to help prevent falls. What can I do on the outside of my home?  Regularly fix the edges of walkways and driveways and fix any cracks.  Remove anything that might make you trip as you walk through a door, such as a raised step or threshold.  Trim any bushes or trees on the path to your home.  Use bright outdoor lighting.  Clear any walking paths of anything that might make someone trip, such as rocks or tools.  Regularly check to see if handrails are loose or broken. Make sure that both sides of any steps have handrails.  Any raised decks and porches should have guardrails on the edges.  Have any leaves, snow, or ice cleared regularly.  Use sand or salt on walking paths during winter.  Clean up any spills in your garage right away. This includes oil or grease spills. What can I do in the bathroom?  Use  night lights.  Install grab bars by the toilet and in the tub and shower. Do not use towel bars as grab bars.  Use non-skid mats or decals in the tub or shower.  If you need to sit down in the shower, use a plastic, non-slip stool.  Keep the floor dry. Clean up any water that spills on the floor as soon as it happens.  Remove soap buildup in the tub or shower regularly.  Attach bath mats securely with double-sided non-slip rug tape.  Do not have throw rugs and other things on the floor that can make you trip. What can I do in the bedroom?  Use night lights.  Make sure that you have a light by your bed that is easy to reach.  Do not use any sheets or blankets that are too big for your bed. They should not hang down onto the floor.  Have a firm chair that has side arms. You can use this for support while you get dressed.  Do not have throw rugs and other things on the floor that can make you trip. What can I do in the kitchen?  Clean up any spills right away.  Avoid walking on wet floors.  Keep items that you use a lot in easy-to-reach places.  If you need to reach something above you, use a strong step stool that has a grab bar.  Keep electrical cords out of the way.  Do not use floor polish or wax that makes floors slippery. If you must use wax, use non-skid floor wax.  Do not have throw rugs and other things on the floor that can make you trip. What can I do with my stairs?  Do not leave any items on the stairs.  Make sure that there are handrails on both sides of the stairs and use them. Fix handrails that are broken or loose. Make sure that handrails are as long as the stairways.  Check any carpeting to make sure that it is firmly attached to the stairs. Fix any carpet that is loose or worn.  Avoid having throw rugs at the top or bottom of the stairs. If you do have throw rugs, attach them to the floor with carpet tape.  Make sure that you have a light switch at  the top of the stairs and the bottom of the stairs. If you do not have them, ask someone to add them for you. What else can I do to help prevent falls?  Wear shoes that:  Do not have high heels.  Have rubber bottoms.  Are comfortable and fit you well.  Are closed at the toe. Do not wear sandals.  If you use a stepladder:  Make sure that it is fully opened. Do not climb a closed stepladder.  Make sure that both sides of the stepladder are locked into place.  Ask someone to hold it for you, if possible.  Clearly mark and make sure that you can see:  Any grab bars or handrails.  First and last steps.  Where the edge of each step is.  Use tools that help you move around (mobility aids) if they are needed. These include:  Canes.  Walkers.  Scooters.  Crutches.  Turn on the lights when you go into a dark area. Replace any light bulbs as soon as they burn out.  Set up your furniture so you have a clear path. Avoid moving your furniture around.  If any of your floors are uneven, fix them.  If there are any pets around you, be aware of where they are.  Review your medicines with your doctor. Some medicines can make you feel dizzy. This can increase your chance of falling. Ask your doctor what other things that you can do to help prevent falls. This information is not intended to replace advice given to you by your health care provider. Make sure you discuss any questions you have with your health care provider. Document Released: 10/30/2008 Document Revised: 06/11/2015 Document Reviewed: 02/07/2014 Elsevier Interactive Patient Education  2017 [employer].

## 2018-07-10 ENCOUNTER — Ambulatory Visit (INDEPENDENT_AMBULATORY_CARE_PROVIDER_SITE_OTHER): Payer: Medicare Other | Admitting: Family Medicine

## 2018-07-10 ENCOUNTER — Other Ambulatory Visit: Payer: Self-pay

## 2018-07-10 ENCOUNTER — Encounter: Payer: Self-pay | Admitting: Family Medicine

## 2018-07-10 DIAGNOSIS — F17210 Nicotine dependence, cigarettes, uncomplicated: Secondary | ICD-10-CM | POA: Diagnosis not present

## 2018-07-10 DIAGNOSIS — I10 Essential (primary) hypertension: Secondary | ICD-10-CM | POA: Diagnosis not present

## 2018-07-10 DIAGNOSIS — E782 Mixed hyperlipidemia: Secondary | ICD-10-CM | POA: Diagnosis not present

## 2018-07-10 DIAGNOSIS — F5104 Psychophysiologic insomnia: Secondary | ICD-10-CM | POA: Diagnosis not present

## 2018-07-10 DIAGNOSIS — K219 Gastro-esophageal reflux disease without esophagitis: Secondary | ICD-10-CM

## 2018-07-10 DIAGNOSIS — F32A Depression, unspecified: Secondary | ICD-10-CM

## 2018-07-10 DIAGNOSIS — E038 Other specified hypothyroidism: Secondary | ICD-10-CM

## 2018-07-10 DIAGNOSIS — F17218 Nicotine dependence, cigarettes, with other nicotine-induced disorders: Secondary | ICD-10-CM

## 2018-07-10 DIAGNOSIS — F419 Anxiety disorder, unspecified: Secondary | ICD-10-CM

## 2018-07-10 DIAGNOSIS — F329 Major depressive disorder, single episode, unspecified: Secondary | ICD-10-CM

## 2018-07-10 MED ORDER — BUSPIRONE HCL 15 MG PO TABS: 15.0000 mg | ORAL_TABLET | Freq: Three times a day (TID) | ORAL | 3 refills | Status: AC

## 2018-07-10 NOTE — Patient Instructions (Addendum)
F/U with MD in early October, flu vaccine at visit call if you need me before  Fasting lipid, cmp and EGFR and TSH 1 week before visit  Please curt back on smoking with a view to quitting  Thanks for choosing [facility], we consider it a privelige to serve you.

## 2018-07-10 NOTE — Progress Notes (Signed)
Virtual Visit via Telephone Note  I connected with Kelly Douglas on 07/10/18 at  2:00 PM EDT by telephone and verified that I am speaking with the correct person using two identifiers.  Location: Patient: home Provider: office   I discussed the limitations, risks, security and privacy concerns of performing an evaluation and management service by telephone and the availability of in person appointments. I also discussed with the patient that there may be a patient responsible charge related to this service. The patient expressed understanding and agreed to proceed. I connected with  Kelly Douglas on 07/10/18 by a video enabled telemedicine application and verified that I am speaking with the correct person using two identifiers.   .      History of Present Illness: F/u chronic problems, medication management , and update health maintenance C/o uncontrolled insomnia, anxiety is improved, accepts her spouse's limitations from his illnesses and is working with them Denies recent fever or chills. Denies sinus pressure, nasal congestion, ear pain or sore throat. Denies chest congestion, productive cough or wheezing. Denies chest pains, palpitations and leg swelling Denies abdominal pain, nausea, vomiting,diarrhea or constipation.   Denies dysuria, frequency, hesitancy or incontinence. C/o chronic  joint pain, swelling and limitation in mobility. Denies headaches, seizures, numbness, or tingling. Denies uncontrolled depression and anxiety  Denies skin break down or rash.       Observations/Objective:  BP 120/70   Ht 5\' 8"  (1.727 m)   Wt 153 lb (69.4 kg)   BMI 23.26 kg/m  Good communication with no confusion and intact memory. Alert and oriented x 3 No signs of respiratory distress during speech   Assessment and Plan:  Essential hypertension Controlled, no change in medication DASH diet and commitment to daily physical activity for a minimum of 30 minutes discussed  and encouraged, as a part of hypertension management. The importance of attaining a healthy weight is also discussed.  BP/Weight 07/10/2018 07/05/2018 05/31/2018 03/29/2018 03/28/2018 01/30/2018 0/86/7619  Systolic BP 509 326 712 458 - 099 833  Diastolic BP 70 70 87 78 - 87 70  Wt. (Lbs) 153 153.04 151 166.45 - 151 151  BMI 23.26 23.27 22.96 - 25.31 22.96 22.96       Anxiety and depression Improved on current medication, no change in management  Insomnia Not at goal, however , no additional medication Therapy offered again and refused  Sleep hygiene reviewed and written information offered also. Prescription sent for  medication needed.   Hyperlipidemia Hyperlipidemia:Low fat diet discussed and encouraged.   Lipid Panel  Lab Results  Component Value Date   CHOL 147 05/24/2018   HDL 38 (L) 05/24/2018   LDLCALC 55 05/24/2018   TRIG 272 (H) 05/24/2018   CHOLHDL 3.2 12/18/2017     Needs to reduce fried and fatty foods and increase exercise  Hypothyroid Controlled, no change in medication   Nicotine dependence Asked:confirms currently smokes cigarettes, avg 10 to 15/day Assess: Unwilling to quit but trying  To cut back Advise: needs to QUIT to reduce risk of cancer, cardio and cerebrovascular disease Assist: counseled for 5 minutes and literature provided Arrange: follow up in 3 months   GERD (gastroesophageal reflux disease) Controlled, no change in medication    Follow Up Instructions:    I discussed the assessment and treatment plan with the patient. The patient was provided an opportunity to ask questions and all were answered. The patient agreed with the plan and demonstrated an understanding of the instructions.  The patient was advised to call back or seek an in-person evaluation if the symptoms worsen or if the condition fails to improve as anticipated.  I provided 22 minutes of non-face-to-face time during this encounter.   Tula Nakayama, MD

## 2018-07-16 ENCOUNTER — Encounter: Payer: Self-pay | Admitting: Internal Medicine

## 2018-07-16 ENCOUNTER — Ambulatory Visit: Payer: Medicare Other | Admitting: Family Medicine

## 2018-07-16 ENCOUNTER — Encounter: Payer: Self-pay | Admitting: Family Medicine

## 2018-07-16 NOTE — Assessment & Plan Note (Signed)
Controlled, no change in medication  

## 2018-07-16 NOTE — Assessment & Plan Note (Signed)
Improved on current medication, no change in management

## 2018-07-16 NOTE — Assessment & Plan Note (Signed)
Not at goal, however , no additional medication Therapy offered again and refused  Sleep hygiene reviewed and written information offered also. Prescription sent for  medication needed.

## 2018-07-16 NOTE — Assessment & Plan Note (Signed)
Hyperlipidemia:Low fat diet discussed and encouraged.   Lipid Panel  Lab Results  Component Value Date   CHOL 147 05/24/2018   HDL 38 (L) 05/24/2018   LDLCALC 55 05/24/2018   TRIG 272 (H) 05/24/2018   CHOLHDL 3.2 12/18/2017     Needs to reduce fried and fatty foods and increase exercise

## 2018-07-16 NOTE — Assessment & Plan Note (Signed)
Asked:confirms currently smokes cigarettes, avg 10 to 15/day Assess: Unwilling to quit but trying  To cut back Advise: needs to QUIT to reduce risk of cancer, cardio and cerebrovascular disease Assist: counseled for 5 minutes and literature provided Arrange: follow up in 3 months

## 2018-07-16 NOTE — Assessment & Plan Note (Signed)
Controlled, no change in medication DASH diet and commitment to daily physical activity for a minimum of 30 minutes discussed and encouraged, as a part of hypertension management. The importance of attaining a healthy weight is also discussed.  BP/Weight 07/10/2018 07/05/2018 05/31/2018 03/29/2018 03/28/2018 01/30/2018 0/56/9794  Systolic BP 801 655 374 827 - 078 675  Diastolic BP 70 70 87 78 - 87 70  Wt. (Lbs) 153 153.04 151 166.45 - 151 151  BMI 23.26 23.27 22.96 - 25.31 22.96 22.96

## 2018-08-01 ENCOUNTER — Ambulatory Visit (HOSPITAL_COMMUNITY)
Admission: RE | Admit: 2018-08-01 | Discharge: 2018-08-01 | Disposition: A | Discharge status: Home / Self Care | Payer: Medicare Other | Source: Ambulatory Visit | Attending: Adult Health | Admitting: Adult Health

## 2018-08-01 ENCOUNTER — Other Ambulatory Visit: Payer: Self-pay

## 2018-08-01 DIAGNOSIS — M349 Systemic sclerosis, unspecified: Secondary | ICD-10-CM

## 2018-08-01 DIAGNOSIS — R911 Solitary pulmonary nodule: Secondary | ICD-10-CM | POA: Insufficient documentation

## 2018-08-06 ENCOUNTER — Other Ambulatory Visit: Payer: Self-pay

## 2018-08-06 DIAGNOSIS — M19019 Primary osteoarthritis, unspecified shoulder: Secondary | ICD-10-CM

## 2018-08-06 MED ORDER — LEVOTHYROXINE SODIUM 75 MCG PO TABS: 37.5000 ug | ORAL_TABLET | ORAL | 0 refills | Status: DC

## 2018-08-06 MED ORDER — DULOXETINE HCL 60 MG PO CPEP: 60.0000 mg | ORAL_CAPSULE | Freq: Every day | ORAL | 0 refills | Status: DC

## 2018-08-06 MED ORDER — PANTOPRAZOLE SODIUM 40 MG PO TBEC: 40.0000 mg | DELAYED_RELEASE_TABLET | Freq: Two times a day (BID) | ORAL | 0 refills | Status: DC

## 2018-08-06 MED ORDER — PRAVASTATIN SODIUM 40 MG PO TABS: 40.0000 mg | ORAL_TABLET | Freq: Every day | ORAL | 0 refills | Status: DC

## 2018-08-06 MED ORDER — MELOXICAM 7.5 MG PO TABS: 7.5000 mg | ORAL_TABLET | Freq: Every day | ORAL | 0 refills | Status: DC

## 2018-08-07 ENCOUNTER — Ambulatory Visit: Payer: Medicare Other | Admitting: Emergency Medicine

## 2018-08-07 ENCOUNTER — Encounter: Payer: Self-pay | Admitting: Emergency Medicine

## 2018-08-07 ENCOUNTER — Other Ambulatory Visit: Payer: Self-pay

## 2018-08-07 DIAGNOSIS — J449 Chronic obstructive pulmonary disease, unspecified: Secondary | ICD-10-CM

## 2018-08-07 DIAGNOSIS — R911 Solitary pulmonary nodule: Secondary | ICD-10-CM | POA: Diagnosis not present

## 2018-08-07 MED ORDER — PROAIR RESPICLICK 108 (90 BASE) MCG/ACT IN AEPB: 2.0000 | INHALATION_SPRAY | Freq: Four times a day (QID) | RESPIRATORY_TRACT | 5 refills | Status: DC | PRN

## 2018-08-07 NOTE — Assessment & Plan Note (Signed)
Mild - rare albuterol use, no exacerbations.   Continue albuterol prn, refill today

## 2018-08-07 NOTE — Progress Notes (Signed)
Subjective:    Patient ID: Kelly Douglas, female    DOB: 06-08-1955, 63 y.o.   MRN: [id number]  HPI 64 year old smoker (45+ pack years) with a history of scleroderma, coronary artery disease, chronic abnormalities on CT scan of the chest that have been characterized by scarring and possible mycobacterial disease.  She has undergone low-dose CT scan lung cancer screening, most recent on 11/01/16.  This study showed overall stability of most of her pulmonary nodular disease but with some interval increase in size of an 11.3 mm irregular central left lower lobe nodule.  This finding prompted a PET scan that was done on 11/17/16 that I have reviewed.  This study shows minimal but discernible FDG uptake in the nodule in question (SUV 1.5). She has been on prednisone before, not currently, last was probably 6 months ago. She has cut down to 1/2 a pack a day. Denies significant cough, chest pain, sputum. No hemoptysis.    ROV 08/29/17 --this is a follow-up visit for patient with a history of tobacco use, scleroderma (on no meds), coronary artery disease.  She has moderate obstruction with a positive bronchodilator response by pulmonary function testing 04/24/2015 that I reviewed.  We have been following chronic scarring and nodular disease by CT scan of the chest, particularly and an 11 mm irregular central left lower lobe nodule that showed minimal hypermetabolism by PET scan 11/17/2016.  Most recently she underwent a CT scan of the chest on 05/18/2017 that I reviewed.  This again showed a slight interval increase in her left lower lobe nodule, now 1.1 x 1.0 cm.  Other scattered nodules in the left lower lobe and the left upper lobe, right lower lobe were stable in size. Very rare albuterol use. Not on scheduled BD's. No flares. She remains on singulair   FEV1 79% pred 2017  ROV 10/10/17 --63 year old smoker with 45-pack-year history, scleroderma, coronary disease and an abnormal CT scan of the chest.  We have  been following pulmonary nodule disease principally left side with serial scans including a PET scan that was done last year.  Given its enlargement I repeated a PET scan on 10/03/2017 as below.  Repeat PET scan done on 10/03/2017 was reviewed by me.  This shows redemonstration of low level hypermetabolism in the slowly enlarging left lower lobe pulmonary nodule, hypermetabolism within the enlarging left greater than right axillary nodes and some low level hypermetabolism in the inguinal nodes.  Pulmonary function testing done 09/05/2017 reviewed by me and showed mild obstructive lung disease without a bronchodilator response, normal lung volumes and a decreased diffusion capacity.  She denies any new dyspnea, has minimal cough. Never hemoptysis.   ROV 08/07/2018 --Kelly Douglas is 8, has a history of tobacco use with mild COPD, scleroderma, coronary disease.  I have followed her for pulmonary nodular disease and abnormal CT scan of the chest.  She has left lower lobe nodular disease, low-level hypermetabolism on PET scan.  Navigational bronchoscopy October 2019 was negative for malignancy.  Most recent CT done 08/01/2018, reviewed by me shows that her nodules are stable in appearance and size, the largest is a semisolid 2.1 x 1.3 cm left lower lobe nodule. Stability to 11/01/16.    Review of Systems  HENT: Positive for dental problem, nosebleeds and sinus pressure.   Respiratory: Positive for shortness of breath.   Allergic/Immunologic: Negative.   Psychiatric/Behavioral: The patient is nervous/anxious.    Past Medical History:  Diagnosis Date  . Anxiety   .  Arteriosclerotic cardiovascular disease (ASCVD) 2013   coronary calcification-left main, LAD and CX; small pericardial effusion  . Arthritis   . Chronic back pain   . Chronic bronchitis   . Chronic lung disease    Chronic scarring and volume loss-left lung; characteristics of a chronic infectious process-possible MAI  . Depression   .  Dyspnea   . GERD (gastroesophageal reflux disease)    Dr Kelly Douglas EGD 09/2009->esophagitis, sm HH, antral erosions, atonic esophagus  . Hyperlipidemia   . Hypertension   . Hypothyroid 1981 approx  . Scleroderma ([place])   . Seasonal allergies   . Syncope    Multiple spells over the past 40+ years, likely neurocardiogenic  . Tobacco abuse 06/24/2009     Family History  Problem Relation Age of Onset  . Stroke Father   . Aneurysm Father   . Coronary artery disease Brother   . Hypertension Brother   . Down syndrome Brother   . Hypertension Brother   . Hypertension Brother        father/mother  . Aneurysm Mother   . Throat cancer Brother 49       throat cancer  . Hypertension Sister   . Diabetes Brother   . Diabetes Brother   . Anesthesia problems Neg Hx   . Hypotension Neg Hx   . Malignant hyperthermia Neg Hx   . Pseudochol deficiency Neg Hx   . Colon cancer Neg Hx      Social History   Socioeconomic History  . Marital status: Married    Spouse name: Not on file  . Number of children: 2  . Years of education: Not on file  . Highest education level: Not on file  Occupational History  . Occupation: disabled    Fish farm manager: NOT EMPLOYED  Social Needs  . Financial resource strain: Very hard  . Food insecurity    Worry: Sometimes true    Inability: Sometimes true  . Transportation needs    Medical: No    Non-medical: No  Tobacco Use  . Smoking status: Current Every Day Smoker    Packs/day: 0.50    Years: 45.00    Pack years: 22.50    Types: Cigarettes    Start date: 07/14/1971  . Smokeless tobacco: Never Used  . Tobacco comment: 0.5 PPD current plans to reduce   Substance and Sexual Activity  . Alcohol use: No    Alcohol/week: 0.0 standard drinks    Comment: quit June 2012-used to drink 6 pack daily  . Drug use: No  . Sexual activity: Yes    Birth control/protection: Post-menopausal  Lifestyle  . Physical activity    Days per week: 2 days    Minutes per session: 20  min  . Stress: Not at all  Relationships  . Social Herbalist on phone: Never    Gets together: More than three times a week    Attends religious service: More than 4 times per year    Active member of club or organization: No    Attends meetings of clubs or organizations: Never    Relationship status: Married  . Intimate partner violence    Fear of current or ex partner: No    Emotionally abused: No    Physically abused: No    Forced sexual activity: No  Other Topics Concern  . Not on file  Social History Narrative  . Not on file     Allergies  Allergen Reactions  . Aspirin  Other (See Comments)    Nose bleeds     Outpatient Medications Prior to Visit  Medication Sig Dispense Refill  . acetaminophen (TYLENOL) 650 MG CR tablet Take 1,300 mg by mouth 2 (two) times daily.     Marland Kitchen amLODipine (NORVASC) 5 MG tablet Take 1 tablet (5 mg total) by mouth daily. 90 tablet 3  . busPIRone (BUSPAR) 15 MG tablet Take 1 tablet (15 mg total) by mouth 3 (three) times daily for 30 days. 270 tablet 3  . cholecalciferol (VITAMIN D) 1000 units tablet Take 1,000 Units by mouth daily.    . DULoxetine (CYMBALTA) 60 MG capsule Take 1 capsule (60 mg total) by mouth daily. 90 capsule 0  . gabapentin (NEURONTIN) 600 MG tablet Take 1 tablet (600 mg total) by mouth 2 (two) times daily. 180 tablet 1  . levothyroxine (SYNTHROID) 75 MCG tablet Take 0.5-1 tablets (37.5-75 mcg total) by mouth See admin instructions. Alternate taking 75 mcg and 37.5 mcg once daily 90 tablet 0  . Lidocaine-Menthol (ICY HOT LIDOCAINE PLUS MENTHOL) 4-1 % PTCH Apply 1 patch topically daily as needed (pain).    . Magnesium 500 MG CAPS Take 1 capsule by mouth daily.    . meloxicam (MOBIC) 7.5 MG tablet Take 1 tablet (7.5 mg total) by mouth daily. 90 tablet 0  . pantoprazole (PROTONIX) 40 MG tablet Take 1 tablet (40 mg total) by mouth 2 (two) times daily. 180 tablet 0  . potassium chloride (KLOR-CON 10) 10 MEQ tablet Take 1  tablet (10 mEq total) by mouth daily. 90 tablet 3  . pravastatin (PRAVACHOL) 40 MG tablet Take 1 tablet (40 mg total) by mouth daily. 90 tablet 0  . PROAIR RESPICLICK 161 (90 Base) MCG/ACT AEPB Inhale 2 puffs into the lungs 4 (four) times daily as needed for wheezing or shortness of breath.     No facility-administered medications prior to visit.         Objective:   Physical Exam Vitals:   08/07/18 1506 08/07/18 1507  BP:  120/80  Pulse:  84  Temp: 98.6 F (37 C)   TempSrc: Oral   SpO2:  97%  Weight: 153 lb (69.4 kg)   Height: 5\' 9"  (1.753 m)    Gen: Pleasant, well-nourished, in no distress,  normal affect  ENT: No lesions,  mouth clear,  oropharynx clear, no postnasal drip  Neck: No JVD, no stridor  Lungs: No use of accessory muscles, clear without rales or rhonchi  Cardiovascular: RRR, heart sounds normal, no murmur or gallops, no peripheral edema  Musculoskeletal: No deformities, no cyanosis or clubbing  Neuro: alert, non focal  Skin: Some sclerodactyly and hyperpigmentation changes on her bilateral hands      Assessment & Plan:  COPD (chronic obstructive pulmonary disease) (HCC) Mild - rare albuterol use, no exacerbations.   Continue albuterol prn, refill today  Pulmonary nodules Remain stable.  Do not appear to have any change since October 2018.  Given the partial groundglass characteristic I think they would need to be followed for 5 years.  Next scan in July 2021.  Baltazar Apo, MD, PhD 08/07/2018, 3:25 PM [facility] [phone] or if no answer [phone]

## 2018-08-07 NOTE — Assessment & Plan Note (Signed)
Remain stable.  Do not appear to have any change since October 2018.  Given the partial groundglass characteristic I think they would need to be followed for 5 years.  Next scan in July 2021.

## 2018-08-07 NOTE — Addendum Note (Signed)
Addended by: Hildred Alamin I on: 08/07/2018 03:31 PM   Modules accepted: Orders

## 2018-08-07 NOTE — Patient Instructions (Addendum)
We will plan to repeat your CT scan of the chest in July 2021, 1 year to look for stability of your pulmonary nodules. Keep albuterol available to use 2 puffs up to every 4 hours if needed for shortness of breath, chest tightness, wheezing.  Follow with Dr. Lamonte Sakai in 12 months or sooner if you have any problems.

## 2018-08-20 ENCOUNTER — Other Ambulatory Visit: Payer: Self-pay

## 2018-08-20 ENCOUNTER — Ambulatory Visit (HOSPITAL_COMMUNITY)
Admission: RE | Admit: 2018-08-20 | Discharge: 2018-08-20 | Disposition: A | Discharge status: Home / Self Care | Payer: Medicare Other | Source: Ambulatory Visit | Attending: Family Medicine | Admitting: Family Medicine

## 2018-08-20 DIAGNOSIS — Z1231 Encounter for screening mammogram for malignant neoplasm of breast: Secondary | ICD-10-CM | POA: Diagnosis not present

## 2018-08-27 ENCOUNTER — Other Ambulatory Visit: Payer: Self-pay | Admitting: Family Medicine

## 2018-08-27 DIAGNOSIS — M549 Dorsalgia, unspecified: Secondary | ICD-10-CM

## 2018-09-02 ENCOUNTER — Other Ambulatory Visit: Payer: Self-pay | Admitting: Family Medicine

## 2018-09-04 ENCOUNTER — Other Ambulatory Visit: Payer: Self-pay | Admitting: Family Medicine

## 2018-09-04 MED ORDER — TRAMADOL HCL 50 MG PO TABS: 50.0000 mg | ORAL_TABLET | Freq: Two times a day (BID) | ORAL | 5 refills | Status: AC

## 2018-09-21 ENCOUNTER — Other Ambulatory Visit: Payer: Self-pay | Admitting: Family Medicine

## 2018-09-24 ENCOUNTER — Other Ambulatory Visit: Payer: Self-pay | Admitting: Family Medicine

## 2018-09-24 DIAGNOSIS — M19019 Primary osteoarthritis, unspecified shoulder: Secondary | ICD-10-CM

## 2018-10-02 DIAGNOSIS — M349 Systemic sclerosis, unspecified: Secondary | ICD-10-CM | POA: Diagnosis not present

## 2018-10-02 DIAGNOSIS — J449 Chronic obstructive pulmonary disease, unspecified: Secondary | ICD-10-CM | POA: Diagnosis not present

## 2018-10-02 DIAGNOSIS — I10 Essential (primary) hypertension: Secondary | ICD-10-CM | POA: Diagnosis not present

## 2018-10-04 ENCOUNTER — Other Ambulatory Visit: Payer: Self-pay | Admitting: Family Medicine

## 2018-10-05 ENCOUNTER — Other Ambulatory Visit: Payer: Self-pay | Admitting: Family Medicine

## 2018-10-08 ENCOUNTER — Other Ambulatory Visit: Payer: Self-pay | Admitting: Family Medicine

## 2018-10-23 ENCOUNTER — Ambulatory Visit: Payer: Medicare Other | Admitting: Family Medicine

## 2018-10-25 ENCOUNTER — Other Ambulatory Visit: Payer: Self-pay | Admitting: Family Medicine

## 2018-10-25 DIAGNOSIS — E876 Hypokalemia: Secondary | ICD-10-CM | POA: Diagnosis not present

## 2018-10-26 LAB — BASIC METABOLIC PANEL
BUN/Creatinine Ratio: 6 — ABNORMAL LOW (ref 12–28)
BUN: 6 mg/dL — ABNORMAL LOW (ref 8–27)
CO2: 24 mmol/L (ref 20–29)
Calcium: 10 mg/dL (ref 8.7–10.3)
Chloride: 101 mmol/L (ref 96–106)
Creatinine, Ser: 0.95 mg/dL (ref 0.57–1.00)
GFR calc Af Amer: 74 mL/min/{1.73_m2} (ref >59)
GFR calc non Af Amer: 64 mL/min/{1.73_m2} (ref >59)
Glucose: 97 mg/dL (ref 65–99)
Potassium: 4.1 mmol/L (ref 3.5–5.2)
Sodium: 141 mmol/L (ref 134–144)

## 2018-10-26 LAB — SPECIMEN STATUS REPORT

## 2018-10-26 LAB — MAGNESIUM: Magnesium: 1.8 mg/dL (ref 1.6–2.3)

## 2018-10-30 ENCOUNTER — Encounter: Payer: Self-pay | Admitting: Family Medicine

## 2018-10-30 ENCOUNTER — Other Ambulatory Visit: Payer: Self-pay

## 2018-10-30 ENCOUNTER — Ambulatory Visit (INDEPENDENT_AMBULATORY_CARE_PROVIDER_SITE_OTHER): Payer: Medicare Other | Admitting: Family Medicine

## 2018-10-30 VITALS — BP 118/84 | HR 98 | Temp 98.6°F | Resp 15 | Ht 69.0 in | Wt 152.0 lb

## 2018-10-30 DIAGNOSIS — F5104 Psychophysiologic insomnia: Secondary | ICD-10-CM

## 2018-10-30 DIAGNOSIS — E782 Mixed hyperlipidemia: Secondary | ICD-10-CM | POA: Diagnosis not present

## 2018-10-30 DIAGNOSIS — F17218 Nicotine dependence, cigarettes, with other nicotine-induced disorders: Secondary | ICD-10-CM

## 2018-10-30 DIAGNOSIS — Z23 Encounter for immunization: Secondary | ICD-10-CM | POA: Diagnosis not present

## 2018-10-30 DIAGNOSIS — I10 Essential (primary) hypertension: Secondary | ICD-10-CM

## 2018-10-30 DIAGNOSIS — F17210 Nicotine dependence, cigarettes, uncomplicated: Secondary | ICD-10-CM

## 2018-10-30 DIAGNOSIS — F329 Major depressive disorder, single episode, unspecified: Secondary | ICD-10-CM

## 2018-10-30 DIAGNOSIS — E559 Vitamin D deficiency, unspecified: Secondary | ICD-10-CM

## 2018-10-30 DIAGNOSIS — F419 Anxiety disorder, unspecified: Secondary | ICD-10-CM

## 2018-10-30 MED ORDER — TRAZODONE HCL 50 MG PO TABS: ORAL_TABLET | ORAL | 1 refills | Status: DC

## 2018-10-30 NOTE — Progress Notes (Signed)
   Kelly Douglas     MRN: [id number]      DOB: Jul 05, 1955   HPI Kelly Douglas is here for follow up and re-evaluation of chronic medical conditions, medication management and review of any available recent lab and radiology data.  Preventive health is updated, specifically  Cancer screening and Immunization.   Dental extraction complete in August 2020, pt much improved health wise as a result. The PT denies any adverse reactions to current medications since the last visit.  Arthritis is controlled. Poor sleep needs medication resumed ROS Denies recent fever or chills. Denies sinus pressure, nasal congestion, ear pain or sore throat. Denies chest congestion, productive cough or wheezing. Denies chest pains, palpitations and leg swelling Denies abdominal pain, nausea, vomiting,diarrhea or constipation.   Denies dysuria, frequency, hesitancy or incontinence. Denies uncontrolled  joint pain, swelling and limitation in mobility. intermittent headaches, denies seizures, c/o l;ower ext numbness,and r tingling. Denies uncontrolled depression, anxiety Denies skin break down or rash.   PE  BP 118/84   Pulse (!) 102   Temp 98.6 F (37 C) (Temporal)   Resp 15   Ht 5\' 9"  (1.753 m)   Wt 152 lb (68.9 kg)   SpO2 97%   BMI 22.45 kg/m   Patient alert and oriented and in no cardiopulmonary distress.  HEENT: No facial asymmetry, EOMI,     Neck supple .  Chest: Clear to auscultation bilaterally.decreased air entry throughout  CVS: S1, S2 no murmurs, no S3.Regular rate.  ABD: Soft non tender.   Ext: No edema  MS: Decreased  ROM spine, shoulders, hips and knees.  Skin: Intact, no ulcerations or rash noted.  Psych: Good eye contact, normal affect. Memory intact not anxious or depressed appearing.  CNS: CN 2-12 intact, power,  normal throughout.no focal deficits noted.   Assessment & Plan  Nicotine dependence Asked:confirms currently smokes cigarettes approx 12 /day Assess:  Unwilling to set a  Quit date  But is  cutting back Advise: needs to QUIT to reduce risk of cancer, cardio and cerebrovascular disease Assist: counseled for 5 minutes and literature provided Arrange: follow up in 3 months   Insomnia Uncontrolled off medication, rume trazodone as before, sleep hygiene reviewed also  Anxiety and depression Controlled, no change in medication   Essential hypertension Controlled, no change in medication DASH diet and commitment to daily physical activity for a minimum of 30 minutes discussed and encouraged, as a part of hypertension management. The importance of attaining a healthy weight is also discussed.  BP/Weight 10/30/2018 08/07/2018 07/10/2018 07/05/2018 05/31/2018 03/29/2018 3/32/9518  Systolic BP 841 660 630 160 109 323 -  Diastolic BP 84 80 70 70 87 78 -  Wt. (Lbs) 152 153 153 153.04 151 166.45 -  BMI 22.45 22.59 23.26 23.27 22.96 - 25.31       Hyperlipidemia Hyperlipidemia:Low fat diet discussed and encouraged.   Lipid Panel  Lab Results  Component Value Date   CHOL 147 05/24/2018   HDL 38 (L) 05/24/2018   LDLCALC 55 05/24/2018   TRIG 272 (H) 05/24/2018   CHOLHDL 3.2 12/18/2017   Updated lab needed at/ before next visit.

## 2018-10-30 NOTE — Patient Instructions (Addendum)
Annual physical exam with mD, December 5 or after, call if you need me before  Flu vaccine today, and pneumonia 23 vaccines today  Now at 7 to 8  cigarettes/ day or less for the month of October , then reduce to 5 to 6/day in November  Medication is sent for sleep  Reduce vit D to one three times weekly  Fasting lipid, hepatic, Vit D and CBc early January

## 2018-10-30 NOTE — Assessment & Plan Note (Addendum)
Asked:confirms currently smokes cigarettes approx 12 /day Assess: Unwilling to set a  Quit date  But is  cutting back Advise: needs to QUIT to reduce risk of cancer, cardio and cerebrovascular disease Assist: counseled for 5 minutes and literature provided Arrange: follow up in 3 months

## 2018-10-31 ENCOUNTER — Encounter: Payer: Self-pay | Admitting: Family Medicine

## 2018-10-31 NOTE — Assessment & Plan Note (Signed)
Hyperlipidemia:Low fat diet discussed and encouraged.   Lipid Panel  Lab Results  Component Value Date   CHOL 147 05/24/2018   HDL 38 (L) 05/24/2018   LDLCALC 55 05/24/2018   TRIG 272 (H) 05/24/2018   CHOLHDL 3.2 12/18/2017   Updated lab needed at/ before next visit.

## 2018-10-31 NOTE — Assessment & Plan Note (Signed)
Uncontrolled off medication, rume trazodone as before, sleep hygiene reviewed also

## 2018-10-31 NOTE — Assessment & Plan Note (Signed)
Controlled, no change in medication  

## 2018-10-31 NOTE — Assessment & Plan Note (Signed)
Controlled, no change in medication DASH diet and commitment to daily physical activity for a minimum of 30 minutes discussed and encouraged, as a part of hypertension management. The importance of attaining a healthy weight is also discussed.  BP/Weight 10/30/2018 08/07/2018 07/10/2018 07/05/2018 05/31/2018 03/29/2018 9/62/2297  Systolic BP 989 211 941 740 814 481 -  Diastolic BP 84 80 70 70 87 78 -  Wt. (Lbs) 152 153 153 153.04 151 166.45 -  BMI 22.45 22.59 23.26 23.27 22.96 - 25.31

## 2018-12-15 ENCOUNTER — Other Ambulatory Visit: Payer: Self-pay | Admitting: Family Medicine

## 2018-12-15 DIAGNOSIS — M19019 Primary osteoarthritis, unspecified shoulder: Secondary | ICD-10-CM

## 2018-12-21 ENCOUNTER — Ambulatory Visit: Payer: Medicare Other | Admitting: Internal Medicine

## 2018-12-21 ENCOUNTER — Encounter: Payer: Self-pay | Admitting: Internal Medicine

## 2018-12-21 ENCOUNTER — Other Ambulatory Visit: Payer: Self-pay

## 2018-12-21 VITALS — BP 127/75 | HR 102 | Temp 96.2°F | Ht 68.0 in | Wt 155.6 lb

## 2018-12-21 DIAGNOSIS — K582 Mixed irritable bowel syndrome: Secondary | ICD-10-CM

## 2018-12-21 NOTE — Progress Notes (Signed)
Primary Care Physician:  [name], MD Primary Gastroenterologist:  Dr. Gala Romney  Pre-Procedure History & Physical: HPI:  Kelly Douglas is a 63 y.o. female here for follow-up GERD and irritable bowel syndrome with episodes of incontinence.  Reflux well controlled on Protonix 40 mg daily to twice daily.  Takes a second dose on occasion with  breakthrough symptoms.  No dysphagia.  Has been taking Imodium 2 mg each morning as previously recommended.  She states that she takes it every day she actually gets constipated she takes it intermittently tends to have 1 bowel movement a day and may have a loose stool with some incontinence 5 days out of 30.  If she takes it every day she does not have a bowel movement for a day or 2.  Past Medical History:  Diagnosis Date  . Anxiety   . Arteriosclerotic cardiovascular disease (ASCVD) 2013   coronary calcification-left main, LAD and CX; small pericardial effusion  . Arthritis   . Chronic back pain   . Chronic bronchitis   . Chronic lung disease    Chronic scarring and volume loss-left lung; characteristics of a chronic infectious process-possible MAI  . Depression   . Dyspnea   . GERD (gastroesophageal reflux disease)    Dr Gala Romney EGD 09/2009->esophagitis, sm HH, antral erosions, atonic esophagus  . Hyperlipidemia   . Hypertension   . Hypothyroid 1981 approx  . Scleroderma ([place])   . Seasonal allergies   . Syncope    Multiple spells over the past 40+ years, likely neurocardiogenic  . Tobacco abuse 06/24/2009    Past Surgical History:  Procedure Laterality Date  . BILATERAL SALPINGOOPHORECTOMY  2013    Dr. Elonda Husky; uterus remains in situ  . COLONOSCOPY  09/28/09   anal papilla otherwise normal  . COLONOSCOPY N/A 08/29/2012   INO:MVEHMCN polyp-removed as described above. tubular adenoma  . ESOPHAGOGASTRODUODENOSCOPY  10/07/09   Dr. Barrie Dunker of esophageal mucosa, diffusely ?esophagitis (bx benign), small HH/antral erosions,  erythema bx benign. 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 ESOPHAGOGASTRODUODENOSCOPY (EGD) WITH ESOPHAGEAL DILATION N/A 03/07/2012   OBS:[id number], patent, tubular esophagus of uncertain significance-status post biopsy )unremarkable). Hiatal hernia  . FUDUCIAL PLACEMENT  10/18/2017   Procedure: PLACEMENT OF 3 FUDUCIAL INTO LEFT LOWER LOBE OF LUNG-TARGET 1;  Surgeon: Collene Gobble, MD;  Location: [facility];  Service: Thoracic;;  . LAPAROSCOPIC APPENDECTOMY  07/16/2011   Procedure: APPENDECTOMY LAPAROSCOPIC;  Surgeon: Donato Heinz, MD;  Location: [facility];  Service: General;  Laterality: N/A;  . TUBAL LIGATION    . VIDEO BRONCHOSCOPY  10/18/2017  . VIDEO BRONCHOSCOPY WITH ENDOBRONCHIAL NAVIGATION N/A 10/18/2017   Procedure: VIDEO BRONCHOSCOPY WITH ENDOBRONCHIAL NAVIGATION;  Surgeon: Collene Gobble, MD;  Location: [facility];  Service: Thoracic;  Laterality: N/A;    Prior to Admission medications   Medication Sig Start Date End Date Taking? Authorizing Provider  acetaminophen (TYLENOL) 650 MG CR tablet Take 1,300 mg by mouth 2 (two) times daily.    Yes [provider]  amLODipine (NORVASC) 5 MG tablet Take 1 tablet (5 mg total) by mouth daily. 05/14/18  Yes [name], MD  cholecalciferol (VITAMIN D) 1000 units tablet Take 1,000 Units by mouth 3 (three) times a week.    Yes [provider]  DULoxetine (CYMBALTA) 60 MG capsule TAKE 1 CAPSULE BY MOUTH  DAILY 12/17/18  Yes [name], MD  gabapentin (NEURONTIN) 600 MG tablet TAKE 1 TABLET BY MOUTH  TWICE DAILY 08/28/18  Yes [name], MD  levothyroxine (SYNTHROID) 75 MCG tablet TAKE 1 TABLET BY MOUTH  EVERY OTHER DAY ALTERNATING WITH 1/2 TABLET EVERY OTHER DAY 12/17/18  Yes [name], MD  Lidocaine-Menthol (ICY HOT LIDOCAINE PLUS MENTHOL) 4-1 % PTCH Apply 1 patch topically daily as needed (pain).   Yes [provider]  loperamide (IMODIUM) 2 MG capsule Take 2-4 mg by mouth as needed for  diarrhea or loose stools.   Yes [provider]  Magnesium 500 MG CAPS Take 1 capsule by mouth daily.   Yes [provider]  meloxicam (MOBIC) 7.5 MG tablet TAKE 1 TABLET BY MOUTH  DAILY 12/17/18  Yes [name], MD  montelukast (SINGULAIR) 10 MG tablet TAKE 1 TABLET BY MOUTH AT  BEDTIME 10/05/18  Yes [name], MD  pantoprazole (PROTONIX) 40 MG tablet TAKE 1 TABLET BY MOUTH  TWICE DAILY 12/17/18  Yes [name], MD  potassium chloride (KLOR-CON 10) 10 MEQ tablet Take 1 tablet (10 mEq total) by mouth daily. 05/14/18  Yes [name], MD  pravastatin (PRAVACHOL) 40 MG tablet TAKE 1 TABLET BY MOUTH  DAILY 09/25/18  Yes [name], MD  PROAIR RESPICLICK 161 [phone] Base) MCG/ACT AEPB Inhale 2 puffs into the lungs 4 (four) times daily as needed. 08/07/18  Yes Collene Gobble, MD  traZODone (DESYREL) 50 MG tablet Take one tablet every night at bedtime , by mouth, for sleep 10/30/18  Yes [name], MD    Allergies as of 12/21/2018 - Review Complete 10/31/2018  Allergen Reaction Noted  . Aspirin Other (See Comments) 05/19/2015    Family History  Problem Relation Age of Onset  . Stroke Father   . Aneurysm Father   . Coronary artery disease Brother   . Hypertension Brother   . Down syndrome Brother   . Hypertension Brother   . Hypertension Brother        father/mother  . Aneurysm Mother   . Throat cancer Brother 49       throat cancer  . Hypertension Sister   . Diabetes Brother   . Diabetes Brother   . Anesthesia problems Neg Hx   . Hypotension Neg Hx   . Malignant hyperthermia Neg Hx   . Pseudochol deficiency Neg Hx   . Colon cancer Neg Hx     Social History   Socioeconomic History  . Marital status: Married    Spouse name: Not on file  . Number of children: 2  . Years of education: Not on file  . Highest education level: Not on file  Occupational History  . Occupation: disabled    Fish farm manager: NOT EMPLOYED  Social  Needs  . Financial resource strain: Very hard  . Food insecurity    Worry: Sometimes true    Inability: Sometimes true  . Transportation needs    Medical: No    Non-medical: No  Tobacco Use  . Smoking status: Current Every Day Smoker    Packs/day: 0.50    Years: 45.00    Pack years: 22.50    Types: Cigarettes    Start date: 07/14/1971  . Smokeless tobacco: Never Used  . Tobacco comment: 0.5 PPD current plans to reduce   Substance and Sexual Activity  . Alcohol use: No    Alcohol/week: 0.0 standard drinks    Comment: quit June 2012-used to drink 6 pack daily  . Drug use: No  . Sexual activity: Yes    Birth  control/protection: Post-menopausal  Lifestyle  . Physical activity    Days per week: 2 days    Minutes per session: 20 min  . Stress: Not at all  Relationships  . Social Herbalist on phone: Never    Gets together: More than three times a week    Attends religious service: More than 4 times per year    Active member of club or organization: No    Attends meetings of clubs or organizations: Never    Relationship status: Married  . Intimate partner violence    Fear of current or ex partner: No    Emotionally abused: No    Physically abused: No    Forced sexual activity: No  Other Topics Concern  . Not on file  Social History Narrative  . Not on file    Review of Systems: See HPI, otherwise negative ROS  Physical Exam: BP 127/75   Pulse (!) 102   Temp (!) 96.2 F (35.7 C) (Temporal)   Ht 5\' 8"  (1.727 m)   Wt 155 lb 9.6 oz (70.6 kg)   BMI 23.66 kg/m  General:   Alert,  Well-developed, well-nourished, pleasant and cooperative in NAD l adenopathy. Lungs:  Clear throughout to auscultation.   No wheezes, crackles, or rhonchi. No acute distress. Heart:  Regular rate and rhythm; no murmurs, clicks, rubs,  or gallops. Abdomen: Non-distended, normal bowel sounds.  Soft and nontender without appreciable mass or hepatosplenomegaly.  Pulses:  Normal pulses  noted. Extremities:  Without clubbing or edema.  Impression/Plan:   63 year old lady with GERD fairly well controlled on once daily PPI therapy.  Occasionally needs a second dose.  No alarm symptoms.  Mixed IBS.  Imodium has helped considerably we need to fine-tune the dose.  She likely has lax pelvic floor musculature.  Would likely benefit from Kegel exercises.  Recommendations:Take 1/2 Imodium EVERY morning; may use additional Imodium later in the day as needed  Call me in 4 weeks and let me know how you are doing  information on Kegel excercises  Continue protonix 40 mg daily to twice daily  OV in 3 months  Colonoscopy in 6 months    Notice: This dictation was prepared with Dragon dictation along with smaller phrase technology. Any transcriptional errors that result from this process are unintentional and may not be corrected upon review.

## 2018-12-21 NOTE — Patient Instructions (Signed)
Take 1/2 Imodium EVERY morning; may use additional Imodium later in the day as needed  Call me in 4 weeks and let me know how you are doing  information on Kegel excercises  Continue protonix 40 mg daily to twice daily  OV in 3 months  Colonoscopy in 6 months

## 2018-12-24 ENCOUNTER — Other Ambulatory Visit: Payer: Self-pay | Admitting: Family Medicine

## 2018-12-25 ENCOUNTER — Other Ambulatory Visit: Payer: Self-pay | Admitting: Family Medicine

## 2018-12-25 DIAGNOSIS — I10 Essential (primary) hypertension: Secondary | ICD-10-CM | POA: Diagnosis not present

## 2018-12-25 DIAGNOSIS — E782 Mixed hyperlipidemia: Secondary | ICD-10-CM | POA: Diagnosis not present

## 2018-12-25 DIAGNOSIS — E559 Vitamin D deficiency, unspecified: Secondary | ICD-10-CM | POA: Diagnosis not present

## 2018-12-27 ENCOUNTER — Encounter: Payer: Medicare Other | Admitting: Family Medicine

## 2018-12-27 LAB — CBC/DIFF AMBIGUOUS DEFAULT
Basophils Absolute: 0 10*3/uL (ref 0.0–0.2)
Basos: 0 %
EOS (ABSOLUTE): 0.1 10*3/uL (ref 0.0–0.4)
Eos: 1 %
Hematocrit: 38.5 % (ref 34.0–46.6)
Hemoglobin: 12.7 g/dL (ref 11.1–15.9)
Immature Grans (Abs): 0 10*3/uL (ref 0.0–0.1)
Immature Granulocytes: 0 %
Lymphocytes Absolute: 4.3 10*3/uL — ABNORMAL HIGH (ref 0.7–3.1)
Lymphs: 43 %
MCH: 26.2 pg — ABNORMAL LOW (ref 26.6–33.0)
MCHC: 33 g/dL (ref 31.5–35.7)
MCV: 79 fL (ref 79–97)
Monocytes Absolute: 1.2 10*3/uL — ABNORMAL HIGH (ref 0.1–0.9)
Monocytes: 12 %
Neutrophils Absolute: 4.4 10*3/uL (ref 1.4–7.0)
Neutrophils: 44 %
Platelets: 349 10*3/uL (ref 150–450)
RBC: 4.85 x10E6/uL (ref 3.77–5.28)
RDW: 14.4 % (ref 11.7–15.4)
WBC: 10.1 10*3/uL (ref 3.4–10.8)

## 2018-12-27 LAB — LIPID PANEL W/O CHOL/HDL RATIO
Cholesterol, Total: 135 mg/dL (ref 100–199)
HDL: 39 mg/dL — ABNORMAL LOW (ref >39)
LDL Chol Calc (NIH): 62 mg/dL (ref 0–99)
Triglycerides: 206 mg/dL — ABNORMAL HIGH (ref 0–149)
VLDL Cholesterol Cal: 34 mg/dL (ref 5–40)

## 2018-12-27 LAB — HEPATIC FUNCTION PANEL
ALT: 23 IU/L (ref 0–32)
AST: 29 IU/L (ref 0–40)
Albumin: 4.4 g/dL (ref 3.8–4.8)
Alkaline Phosphatase: 103 IU/L (ref 39–117)
Bilirubin Total: 0.4 mg/dL (ref 0.0–1.2)
Bilirubin, Direct: 0.12 mg/dL (ref 0.00–0.40)
Total Protein: 7.3 g/dL (ref 6.0–8.5)

## 2018-12-27 LAB — VITAMIN D 25 HYDROXY (VIT D DEFICIENCY, FRACTURES): Vit D, 25-Hydroxy: 47 ng/mL (ref 30.0–100.0)

## 2018-12-27 LAB — SPECIMEN STATUS REPORT

## 2018-12-30 ENCOUNTER — Other Ambulatory Visit: Payer: Self-pay | Admitting: Family Medicine

## 2018-12-30 MED ORDER — TRAMADOL HCL 50 MG PO TABS: 50.0000 mg | ORAL_TABLET | Freq: Two times a day (BID) | ORAL | 4 refills | Status: AC

## 2019-01-24 ENCOUNTER — Encounter: Payer: Medicare Other | Admitting: Family Medicine

## 2019-01-24 ENCOUNTER — Telehealth: Payer: Self-pay | Admitting: Internal Medicine

## 2019-01-24 NOTE — Telephone Encounter (Signed)
Defer to RMR who recently saw patient.

## 2019-01-24 NOTE — Telephone Encounter (Signed)
Pt called to say the medication was working for [employer] and now she is starting to have diarrhea again. She doesn't know the name of the medication. [phone]

## 2019-01-24 NOTE — Telephone Encounter (Signed)
Spoke with pt. She was seen on 12/21/2018. She has been doing the Kegel exercises as directed by RMR. The diarrhea was improving and would give pt a warning when she had to have a bowel movement. Pt says this week the diarrhea started. Pt having 5-6 bowel movements again. Pt is taking the Imodium 1/2 to 1 tab throughout the day as directed. Pt hasn't been on any recent antibiotics. Pt would like to know if she could have a prescription for Questran powder. She feels the Questran powder worked the best for her.

## 2019-01-28 ENCOUNTER — Other Ambulatory Visit: Payer: Self-pay

## 2019-01-28 MED ORDER — CHOLESTYRAMINE 4 G PO PACK: PACK | ORAL | 1 refills | Status: DC

## 2019-01-28 NOTE — Telephone Encounter (Signed)
RX was sent to pts pharmacy. Good RX coupons mailed to pt to hep with the cost.

## 2019-01-28 NOTE — Telephone Encounter (Signed)
Communication noted.  We can send in a prescription for Questran.  I would like her to take 2 g orally daily.  Not to be taken within 2 hours of any of her medications. (Before or after).  Dispense 30 doses with 1 refill

## 2019-02-18 ENCOUNTER — Encounter: Payer: Medicare HMO | Admitting: Family Medicine

## 2019-03-13 ENCOUNTER — Encounter: Payer: Medicare HMO | Admitting: Family Medicine

## 2019-03-13 ENCOUNTER — Telehealth: Payer: Self-pay | Admitting: *Deleted

## 2019-03-13 NOTE — Telephone Encounter (Signed)
Pt wants to see if she can take a full packet of Cholestyramine 4 grams versus 1/2 packet.  [phone]

## 2019-03-13 NOTE — Telephone Encounter (Signed)
RMR, please advise if it's safe to take a full packet of Questran per pts request.

## 2019-03-14 NOTE — Telephone Encounter (Signed)
Noted. Spoke with pt, pt notified of RMR's recommendations.

## 2019-03-14 NOTE — Telephone Encounter (Signed)
See prior note

## 2019-03-14 NOTE — Telephone Encounter (Signed)
Yes, that would be okay so long as she does not take any of her medications within 2 hours of Questran ingestion (before or after)

## 2019-03-26 ENCOUNTER — Other Ambulatory Visit: Payer: Self-pay

## 2019-03-26 ENCOUNTER — Ambulatory Visit: Payer: Medicare Other | Admitting: Internal Medicine

## 2019-03-26 ENCOUNTER — Encounter: Payer: Self-pay | Admitting: Internal Medicine

## 2019-03-26 VITALS — BP 118/75 | HR 94 | Temp 96.9°F | Ht 68.0 in | Wt 149.6 lb

## 2019-03-26 DIAGNOSIS — K582 Mixed irritable bowel syndrome: Secondary | ICD-10-CM | POA: Diagnosis not present

## 2019-03-26 DIAGNOSIS — R159 Full incontinence of feces: Secondary | ICD-10-CM

## 2019-03-26 DIAGNOSIS — R194 Change in bowel habit: Secondary | ICD-10-CM

## 2019-03-26 DIAGNOSIS — R152 Fecal urgency: Secondary | ICD-10-CM

## 2019-03-26 DIAGNOSIS — K219 Gastro-esophageal reflux disease without esophagitis: Secondary | ICD-10-CM

## 2019-03-26 NOTE — Patient Instructions (Signed)
Continue protonix 40 mg once to twice daily  Continue Questran one half to one packet daily - not to be taken within 2 hours of other medications  OV 3 months to re-assess and set up a surveillance colonoscopy

## 2019-03-31 NOTE — Progress Notes (Signed)
Primary Care Physician:  [name], MD Primary Gastroenterologist:  Dr. Gala Romney  Pre-Procedure History & Physical: HPI:  Kelly Douglas is a 64 y.o. female here for follow-up of GERD and altered bowel function.  History of colonic adenoma.  Protonix 40 mg daily to twice daily continues to control her reflux symptoms very well.  No dysphagia.  She will be due for a surveillance colonoscopy (history colonic adenoma) later this year.  Episodes of fecal incontinence some loose stools much, much better with off label use of Questran 2 to 4 g daily.  She feels 4g is better for her than 2 g.  She is also utilizing Kegel exercises Imodium as needed.  Very pleased with her progress.  Past Medical History:  Diagnosis Date  . Anxiety   . Arteriosclerotic cardiovascular disease (ASCVD) 2013   coronary calcification-left main, LAD and CX; small pericardial effusion  . Arthritis   . Chronic back pain   . Chronic bronchitis   . Chronic lung disease    Chronic scarring and volume loss-left lung; characteristics of a chronic infectious process-possible MAI  . Depression   . Dyspnea   . GERD (gastroesophageal reflux disease)    Dr Gala Romney EGD 09/2009->esophagitis, sm HH, antral erosions, atonic esophagus  . Hyperlipidemia   . Hypertension   . Hypothyroid 1981 approx  . Scleroderma ([place])   . Seasonal allergies   . Syncope    Multiple spells over the past 40+ years, likely neurocardiogenic  . Tobacco abuse 06/24/2009    Past Surgical History:  Procedure Laterality Date  . BILATERAL SALPINGOOPHORECTOMY  2013    Dr. Elonda Husky; uterus remains in situ  . COLONOSCOPY  09/28/09   anal papilla otherwise normal  . COLONOSCOPY N/A 08/29/2012   YHC:[id number] polyp-removed as described above. tubular adenoma  . ESOPHAGOGASTRODUODENOSCOPY  10/07/09   Dr. Barrie Dunker of esophageal mucosa, diffusely ?esophagitis (bx benign), small HH/antral erosions, erythema bx benign. 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 ESOPHAGOGASTRODUODENOSCOPY (EGD) WITH ESOPHAGEAL DILATION N/A 03/07/2012   [id number]:[id number], patent, tubular esophagus of uncertain significance-status post biopsy )unremarkable). Hiatal hernia  . FUDUCIAL PLACEMENT  10/18/2017   Procedure: PLACEMENT OF 3 FUDUCIAL INTO LEFT LOWER LOBE OF LUNG-TARGET 1;  Surgeon: Collene Gobble, MD;  Location: [facility];  Service: Thoracic;;  . LAPAROSCOPIC APPENDECTOMY  07/16/2011   Procedure: APPENDECTOMY LAPAROSCOPIC;  Surgeon: Donato Heinz, MD;  Location: [facility];  Service: General;  Laterality: N/A;  . TUBAL LIGATION    . VIDEO BRONCHOSCOPY  10/18/2017  . VIDEO BRONCHOSCOPY WITH ENDOBRONCHIAL NAVIGATION N/A 10/18/2017   Procedure: VIDEO BRONCHOSCOPY WITH ENDOBRONCHIAL NAVIGATION;  Surgeon: Collene Gobble, MD;  [facility];  Service: Thoracic;  Laterality: N/A;    Prior to Admission medications   Medication Sig Start Date End Date Taking? Authorizing Provider  acetaminophen (TYLENOL) 650 MG CR tablet Take 1,300 mg by mouth 2 (two) times daily.    Yes [provider]  amLODipine (NORVASC) 5 MG tablet Take 1 tablet (5 mg total) by mouth daily. 05/14/18  Yes [name], MD  cholecalciferol (VITAMIN D) 1000 units tablet Take 1,000 Units by mouth 3 (three) times a week.    Yes [provider]  cholestyramine (QUESTRAN) 4 g packet Take 2 gm orally daily. Not to be taken 2 hours of other medications. Patient taking differently: Take 4 g by mouth. Take 2 gm orally daily. Not to be taken 2 hours of other medications. 01/28/19  Yes ,  [name], MD  DULoxetine (CYMBALTA) 60 MG capsule TAKE 1 CAPSULE BY MOUTH  DAILY 12/17/18  Yes [name], MD  gabapentin (NEURONTIN) 600 MG tablet TAKE 1 TABLET BY MOUTH  TWICE DAILY 08/28/18  Yes [name], MD  levothyroxine (SYNTHROID) 75 MCG tablet TAKE 1 TABLET BY MOUTH  EVERY OTHER DAY ALTERNATING WITH 1/2 TABLET EVERY OTHER DAY 12/17/18  Yes [name], MD   Lidocaine-Menthol (ICY HOT LIDOCAINE PLUS MENTHOL) 4-1 % PTCH Apply 1 patch topically daily as needed (pain).   Yes [provider]  loperamide (IMODIUM) 2 MG capsule Take 2-4 mg by mouth as needed for diarrhea or loose stools.   Yes [provider]  Magnesium 500 MG CAPS Take 1 capsule by mouth daily.   Yes [provider]  meloxicam (MOBIC) 7.5 MG tablet TAKE 1 TABLET BY MOUTH  DAILY 12/17/18  Yes [name], MD  montelukast (SINGULAIR) 10 MG tablet TAKE 1 TABLET BY MOUTH AT  BEDTIME 10/05/18  Yes [name], MD  pantoprazole (PROTONIX) 40 MG tablet TAKE 1 TABLET BY MOUTH  TWICE DAILY 12/17/18  Yes [name], MD  potassium chloride (KLOR-CON 10) 10 MEQ tablet Take 1 tablet (10 mEq total) by mouth daily. 05/14/18  Yes [name], MD  pravastatin (PRAVACHOL) 40 MG tablet TAKE 1 TABLET BY MOUTH  DAILY 09/25/18  Yes [name], MD  PROAIR RESPICLICK 151 [phone] Base) MCG/ACT AEPB Inhale 2 puffs into the lungs 4 (four) times daily as needed. 08/07/18  Yes Collene Gobble, MD  traZODone (DESYREL) 50 MG tablet Take one tablet every night at bedtime , by mouth, for sleep 10/30/18  Yes [name], MD    Allergies as of 03/26/2019 - Review Complete 10/31/2018  Allergen Reaction Noted  . Aspirin Other (See Comments) 05/19/2015    Family History  Problem Relation Age of Onset  . Stroke Father   . Aneurysm Father   . Coronary artery disease Brother   . Hypertension Brother   . Down syndrome Brother   . Hypertension Brother   . Hypertension Brother        father/mother  . Aneurysm Mother   . Throat cancer Brother 49       throat cancer  . Hypertension Sister   . Diabetes Brother   . Diabetes Brother   . Anesthesia problems Neg Hx   . Hypotension Neg Hx   . Malignant hyperthermia Neg Hx   . Pseudochol deficiency Neg Hx   . Colon cancer Neg Hx     Social History   Socioeconomic History  . Marital status: Married     Spouse name: Not on file  . Number of children: 2  . Years of education: Not on file  . Highest education level: Not on file  Occupational History  . Occupation: disabled    Fish farm manager: NOT EMPLOYED  Tobacco Use  . Smoking status: Current Every Day Smoker    Packs/day: 0.50    Years: 45.00    Pack years: 22.50    Types: Cigarettes    Start date: 07/14/1971  . Smokeless tobacco: Never Used  . Tobacco comment: 0.5 PPD current plans to reduce   Substance and Sexual Activity  . Alcohol use: No    Alcohol/week: 0.0 standard drinks    Comment: quit June 2012-used to drink 6 pack daily  . Drug use: No  . Sexual activity: Yes    Birth control/protection:  Post-menopausal  Other Topics Concern  . Not on file  Social History Narrative  . Not on file   Social Determinants of Health   Financial Resource Strain:   . Difficulty of Paying Living Expenses:   Food Insecurity:   . Worried About Charity fundraiser in the Last Year:   . Arboriculturist in the Last Year:   Transportation Needs:   . Film/video editor (Medical):   Marland Kitchen Lack of Transportation (Non-Medical):   Physical Activity:   . Days of Exercise per Week:   . Minutes of Exercise per Session:   Stress:   . Feeling of Stress :   Social Connections:   . Frequency of Communication with Friends and Family:   . Frequency of Social Gatherings with Friends and Family:   . Attends Religious Services:   . Active Member of Clubs or Organizations:   . Attends Archivist Meetings:   Marland Kitchen Marital Status:   Intimate Partner Violence:   . Fear of Current or Ex-Partner:   . Emotionally Abused:   Marland Kitchen Physically Abused:   . Sexually Abused:     Review of Systems: See HPI, otherwise negative ROS  Physical Exam: BP 118/75   Pulse 94   Temp (!) 96.9 F (36.1 C) (Temporal)   Ht 5\' 8"  (1.727 m)   Wt 149 lb 9.6 oz (67.9 kg)   BMI 22.75 kg/m  General:   Alert,  Well-developed, well-nourished, pleasant and cooperative in  NAD Neck:  Supple; no masses or thyromegaly. No significant cervical adenopathy. Lungs:  Clear throughout to auscultation.   No wheezes, crackles, or rhonchi. No acute distress. Heart:  Regular rate and rhythm; no murmurs, clicks, rubs,  or gallops. Abdomen: Non-distended, normal bowel sounds.  Soft and nontender without appreciable mass or hepatosplenomegaly.  Pulses:  Normal pulses noted. Extremities:  Without clubbing or edema.  Impression/Plan: Pleasant 64 year old lady with longstanding GERD well-controlled on once daily to twice daily Protonix without alarm features.  History colonic adenoma; due for surveillance colonoscopy later this year.  Episodes of loose stool and fecal incontinence likely related to lax pelvic floor musculature and an element of irritable bowel syndrome.  Doing much better on her binder therapy in way of off label Questran.   Continue protonix 40 mg once to twice daily  Continue Questran one half to one packet daily - not to be taken within 2 hours of other medications  OV 3 months to re-assess and set up a surveillance colonoscopy   Notice: This dictation was prepared with Dragon dictation along with smaller phrase technology. Any transcriptional errors that result from this process are unintentional and may not be corrected upon review.

## 2019-04-10 ENCOUNTER — Other Ambulatory Visit: Payer: Self-pay

## 2019-04-10 MED ORDER — CHOLESTYRAMINE 4 G PO PACK: PACK | ORAL | 5 refills | Status: DC

## 2019-04-17 ENCOUNTER — Other Ambulatory Visit: Payer: Self-pay | Admitting: *Deleted

## 2019-04-17 DIAGNOSIS — M549 Dorsalgia, unspecified: Secondary | ICD-10-CM

## 2019-04-17 DIAGNOSIS — M19019 Primary osteoarthritis, unspecified shoulder: Secondary | ICD-10-CM

## 2019-04-17 MED ORDER — LEVOTHYROXINE SODIUM 75 MCG PO TABS: ORAL_TABLET | ORAL | 0 refills | Status: DC

## 2019-04-17 MED ORDER — DULOXETINE HCL 60 MG PO CPEP: 60.0000 mg | ORAL_CAPSULE | Freq: Every day | ORAL | 0 refills | Status: DC

## 2019-04-17 MED ORDER — POTASSIUM CHLORIDE ER 10 MEQ PO TBCR: 10.0000 meq | EXTENDED_RELEASE_TABLET | Freq: Every day | ORAL | 3 refills | Status: DC

## 2019-04-17 MED ORDER — GABAPENTIN 600 MG PO TABS: 600.0000 mg | ORAL_TABLET | Freq: Two times a day (BID) | ORAL | 1 refills | Status: DC

## 2019-04-17 MED ORDER — AMLODIPINE BESYLATE 5 MG PO TABS: 5.0000 mg | ORAL_TABLET | Freq: Every day | ORAL | 3 refills | Status: DC

## 2019-04-17 MED ORDER — PANTOPRAZOLE SODIUM 40 MG PO TBEC: 40.0000 mg | DELAYED_RELEASE_TABLET | Freq: Two times a day (BID) | ORAL | 0 refills | Status: DC

## 2019-04-17 MED ORDER — PRAVASTATIN SODIUM 40 MG PO TABS: 40.0000 mg | ORAL_TABLET | Freq: Every day | ORAL | 3 refills | Status: DC

## 2019-04-17 MED ORDER — MELOXICAM 7.5 MG PO TABS: 7.5000 mg | ORAL_TABLET | Freq: Every day | ORAL | 0 refills | Status: DC

## 2019-04-17 MED ORDER — MONTELUKAST SODIUM 10 MG PO TABS: 10.0000 mg | ORAL_TABLET | Freq: Every day | ORAL | 1 refills | Status: DC

## 2019-04-18 ENCOUNTER — Encounter: Payer: Medicare HMO | Admitting: Family Medicine

## 2019-05-16 ENCOUNTER — Other Ambulatory Visit: Payer: Self-pay

## 2019-05-16 ENCOUNTER — Ambulatory Visit (INDEPENDENT_AMBULATORY_CARE_PROVIDER_SITE_OTHER): Payer: Medicare HMO | Admitting: Family Medicine

## 2019-05-16 ENCOUNTER — Encounter: Payer: Self-pay | Admitting: Family Medicine

## 2019-05-16 VITALS — BP 112/74 | HR 70 | Temp 98.0°F | Ht 68.0 in | Wt 145.4 lb

## 2019-05-16 DIAGNOSIS — J449 Chronic obstructive pulmonary disease, unspecified: Secondary | ICD-10-CM | POA: Diagnosis not present

## 2019-05-16 DIAGNOSIS — I10 Essential (primary) hypertension: Secondary | ICD-10-CM | POA: Diagnosis not present

## 2019-05-16 DIAGNOSIS — R590 Localized enlarged lymph nodes: Secondary | ICD-10-CM | POA: Insufficient documentation

## 2019-05-16 DIAGNOSIS — J209 Acute bronchitis, unspecified: Secondary | ICD-10-CM

## 2019-05-16 DIAGNOSIS — J440 Chronic obstructive pulmonary disease with acute lower respiratory infection: Secondary | ICD-10-CM

## 2019-05-16 DIAGNOSIS — R490 Dysphonia: Secondary | ICD-10-CM

## 2019-05-16 MED ORDER — AMOXICILLIN-POT CLAVULANATE 875-125 MG PO TABS: 1.0000 | ORAL_TABLET | Freq: Two times a day (BID) | ORAL | 0 refills | Status: DC

## 2019-05-16 NOTE — Patient Instructions (Addendum)
   mucinex DM 600mg  12hour Augmentin twice a day WITH FOOD Use ventolin as needed for Short of breath Appointment with ENT for follow up   If you have lab work done today you will be contacted with your lab results within the next 2 weeks.  If you have not heard from [place] then please contact us. The fastest way to get your results is to register for My Chart.   IF you received an x-ray today, you will receive an invoice from [facility]. Please contact [facility] at [phone] with questions or concerns regarding your invoice.   IF you received labwork today, you will receive an invoice from [facility]. Please contact [facility] at [phone] with questions or concerns regarding your invoice.   Our billing staff will not be able to assist you with questions regarding bills from these companies.  You will be contacted with the lab results as soon as they are available. The fastest way to get your results is to activate your My Chart account. Instructions are located on the last page of this paperwork. If you have not heard from [place] regarding the results in 2 weeks, please contact this office.

## 2019-05-16 NOTE — Progress Notes (Signed)
Established Patient Office Visit  Subjective:  Patient ID: Kelly Douglas, female    DOB: 09-12-1955  Age: 64 y.o. MRN: [id number]  CC:  Chief Complaint  Patient presents with  . Laryngitis    been going on for 1 year but worse in the last 4 month and now with drainage for the past 2 week     HPI Kelly Douglas presents for hoarsenss x 3 weeks. Pt states episodic for the last 2 years-seen by ENT specialist in the past with concern for reflux causing symptoms. Pt states no scope to look at vocal cords. Pt taking Protonix daily. Pt with congestion -cough-yellow/green, thick mucous. Pt states h/o of pulmonary nodules followed by Pulmonary with bronchoscopy. No fever. No difficulty swallowing. COPD with occasional use of ventolin  Past Medical History:  Diagnosis Date  . Anxiety   . Arteriosclerotic cardiovascular disease (ASCVD) 2013   coronary calcification-left main, LAD and CX; small pericardial effusion  . Arthritis   . Chronic back pain   . Chronic bronchitis   . Chronic lung disease    Chronic scarring and volume loss-left lung; characteristics of a chronic infectious process-possible MAI  . Depression   . Dyspnea   . GERD (gastroesophageal reflux disease)    Dr Gala Romney EGD 09/2009->esophagitis, sm HH, antral erosions, atonic esophagus  . Hyperlipidemia   . Hypertension   . Hypothyroid 1981 approx  . Scleroderma ([place])   . Seasonal allergies   . Syncope    Multiple spells over the past 40+ years, likely neurocardiogenic  . Tobacco abuse 06/24/2009    Past Surgical History:  Procedure Laterality Date  . BILATERAL SALPINGOOPHORECTOMY  2013    Dr. Elonda Husky; uterus remains in situ  . COLONOSCOPY  09/28/09   anal papilla otherwise normal  . COLONOSCOPY N/A 08/29/2012   HUT:[id number] polyp-removed as described above. tubular adenoma  . ESOPHAGOGASTRODUODENOSCOPY  10/07/09   Dr. Barrie Dunker of esophageal mucosa, diffusely ?esophagitis (bx benign), small HH/antral erosions,  erythema bx benign. 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 ESOPHAGOGASTRODUODENOSCOPY (EGD) WITH ESOPHAGEAL DILATION N/A 03/07/2012   TWS:[id number], patent, tubular esophagus of uncertain significance-status post biopsy )unremarkable). Hiatal hernia  . FUDUCIAL PLACEMENT  10/18/2017   Procedure: PLACEMENT OF 3 FUDUCIAL INTO LEFT LOWER LOBE OF LUNG-TARGET 1;  Surgeon: Collene Gobble, MD;  Location: [facility];  Service: Thoracic;;  . LAPAROSCOPIC APPENDECTOMY  07/16/2011   Procedure: APPENDECTOMY LAPAROSCOPIC;  Surgeon: Donato Heinz, MD;  Location: [facility];  Service: General;  Laterality: N/A;  . TUBAL LIGATION    . VIDEO BRONCHOSCOPY  10/18/2017  . VIDEO BRONCHOSCOPY WITH ENDOBRONCHIAL NAVIGATION N/A 10/18/2017   Procedure: VIDEO BRONCHOSCOPY WITH ENDOBRONCHIAL NAVIGATION;  Surgeon: Collene Gobble, MD;  Location[facility];  Service: Thoracic;  Laterality: N/A;    Family History  Problem Relation Age of Onset  . Stroke Father   . Aneurysm Father   . Coronary artery disease Brother   . Hypertension Brother   . Down syndrome Brother   . Hypertension Brother   . Hypertension Brother        father/mother  . Aneurysm Mother   . Throat cancer Brother 49       throat cancer  . Hypertension Sister   . Diabetes Brother   . Diabetes Brother   . Anesthesia problems Neg Hx   . Hypotension Neg Hx   . Malignant hyperthermia Neg Hx   . Pseudochol deficiency Neg Hx   . Colon cancer Neg Hx  Social History   Socioeconomic History  . Marital status: Married    Spouse name: Not on file  . Number of children: 2  . Years of education: Not on file  . Highest education level: Not on file  Occupational History  . Occupation: disabled    Fish farm manager: NOT EMPLOYED  Tobacco Use  . Smoking status: Current Every Day Smoker    Packs/day: 0.50    Years: 45.00    Pack years: 22.50    Types: Cigarettes    Start date: 07/14/1971  . Smokeless tobacco: Never Used  . Tobacco comment: 0.5 PPD current  plans to reduce   Substance and Sexual Activity  . Alcohol use: No    Alcohol/week: 0.0 standard drinks    Comment: quit June 2012-used to drink 6 pack daily  . Drug use: No  . Sexual activity: Yes    Birth control/protection: Post-menopausal  Other Topics Concern  . Not on file  Social History Narrative  . Not on file   Social Determinants of Health   Financial Resource Strain:   . Difficulty of Paying Living Expenses:   Food Insecurity:   . Worried About Charity fundraiser in the Last Year:   . Arboriculturist in the Last Year:   Transportation Needs:   . Film/video editor (Medical):   Marland Kitchen Lack of Transportation (Non-Medical):   Physical Activity:   . Days of Exercise per Week:   . Minutes of Exercise per Session:   Stress:   . Feeling of Stress :   Social Connections:   . Frequency of Communication with Friends and Family:   . Frequency of Social Gatherings with Friends and Family:   . Attends Religious Services:   . Active Member of Clubs or Organizations:   . Attends Archivist Meetings:   Marland Kitchen Marital Status:   Intimate Partner Violence:   . Fear of Current or Ex-Partner:   . Emotionally Abused:   Marland Kitchen Physically Abused:   . Sexually Abused:     Outpatient Medications Prior to Visit  Medication Sig Dispense Refill  . acetaminophen (TYLENOL) 650 MG CR tablet Take 1,300 mg by mouth 2 (two) times daily.     Marland Kitchen amLODipine (NORVASC) 5 MG tablet Take 1 tablet (5 mg total) by mouth daily. 90 tablet 3  . cholecalciferol (VITAMIN D) 1000 units tablet Take 1,000 Units by mouth 3 (three) times a week.     . cholestyramine (QUESTRAN) 4 g packet Take 2 gm orally daily. Not to be taken 2 hours of other medications. 30 packet 5  . DULoxetine (CYMBALTA) 60 MG capsule Take 1 capsule (60 mg total) by mouth daily. 90 capsule 0  . gabapentin (NEURONTIN) 600 MG tablet Take 1 tablet (600 mg total) by mouth 2 (two) times daily. 180 tablet 1  . levothyroxine (SYNTHROID) 75 MCG  tablet TAKE 1 TABLET BY MOUTH  EVERY OTHER DAY ALTERNATING WITH 1/2 TABLET EVERY OTHER DAY 68 tablet 0  . Lidocaine-Menthol (ICY HOT LIDOCAINE PLUS MENTHOL) 4-1 % PTCH Apply 1 patch topically daily as needed (pain).    Marland Kitchen loperamide (IMODIUM) 2 MG capsule Take 2-4 mg by mouth as needed for diarrhea or loose stools.    . Magnesium 500 MG CAPS Take 1 capsule by mouth daily.    . meloxicam (MOBIC) 7.5 MG tablet Take 1 tablet (7.5 mg total) by mouth daily. 90 tablet 0  . montelukast (SINGULAIR) 10 MG tablet Take 1 tablet (  10 mg total) by mouth at bedtime. 90 tablet 1  . pantoprazole (PROTONIX) 40 MG tablet Take 1 tablet (40 mg total) by mouth 2 (two) times daily. 180 tablet 0  . potassium chloride (KLOR-CON 10) 10 MEQ tablet Take 1 tablet (10 mEq total) by mouth daily. 90 tablet 3  . pravastatin (PRAVACHOL) 40 MG tablet Take 1 tablet (40 mg total) by mouth daily. 90 tablet 3  . PROAIR RESPICLICK 458 (90 Base) MCG/ACT AEPB Inhale 2 puffs into the lungs 4 (four) times daily as needed. 1 each 5  . traZODone (DESYREL) 50 MG tablet Take one tablet every night at bedtime , by mouth, for sleep 90 tablet 1   No facility-administered medications prior to visit.    Allergies  Allergen Reactions  . Aspirin Other (See Comments)    Nose bleeds    ROS Review of Systems  HENT: Positive for congestion, sinus pressure and voice change.   Respiratory: Positive for cough and shortness of breath.        COPD  Cardiovascular: Negative.   Neurological: Negative for dizziness and headaches.      Objective:    Physical Exam  Constitutional: She appears well-developed and well-nourished.  HENT:  Mouth/Throat: Oropharynx is clear and moist. No oropharyngeal exudate.  Eyes: Conjunctivae are normal.  Cardiovascular: Normal rate, regular rhythm, normal heart sounds and intact distal pulses.  Pulmonary/Chest: Effort normal and breath sounds normal. No respiratory distress. She has no wheezes.  Psychiatric: She  has a normal mood and affect. Her behavior is normal.    BP 112/74 (BP Location: Right Arm, Patient Position: Sitting)   Pulse 70   Temp 98 F (36.7 C) (Temporal)   Ht 5\' 8"  (1.727 m)   Wt 145 lb 6.4 oz (66 kg)   SpO2 96%   BMI 22.11 kg/m  Wt Readings from Last 3 Encounters:  05/16/19 145 lb 6.4 oz (66 kg)  03/26/19 149 lb 9.6 oz (67.9 kg)  12/21/18 155 lb 9.6 oz (70.6 kg)    Health Maintenance Due  Topic Date Due  . COVID-19 Vaccine (1) Never done    Lab Results  Component Value Date   TSH 1.880 05/24/2018   Lab Results  Component Value Date   WBC 10.1 12/25/2018   HGB 12.7 12/25/2018   HCT 38.5 12/25/2018   MCV 79 12/25/2018   PLT 349 12/25/2018   Lab Results  Component Value Date   NA 141 10/25/2018   K 4.1 10/25/2018   CO2 24 10/25/2018   GLUCOSE 97 10/25/2018   BUN 6 (L) 10/25/2018   CREATININE 0.95 10/25/2018   BILITOT 0.4 12/25/2018   ALKPHOS 103 12/25/2018   AST 29 12/25/2018   ALT 23 12/25/2018   PROT 7.3 12/25/2018   ALBUMIN 4.4 12/25/2018   CALCIUM 10.0 10/25/2018   ANIONGAP 11 03/29/2018   Lab Results  Component Value Date   CHOL 135 12/25/2018   Lab Results  Component Value Date   HDL 39 (L) 12/25/2018   Lab Results  Component Value Date   LDLCALC 62 12/25/2018   Lab Results  Component Value Date   TRIG 206 (H) 12/25/2018   Lab Results  Component Value Date   CHOLHDL 3.2 12/18/2017   Lab Results  Component Value Date   HGBA1C 5.1 10/27/2015      Assessment & Plan:  1. Hoarseness of voice - Ambulatory referral to ENT  2. Essential hypertension Stable today on current medication  3. Chronic  obstructive pulmonary disease, unspecified COPD type (HCC) Ventolin prn  4. Cervical lymphadenopathy augmentin-rx, mucinex 5. Acute bronchitis with COPD (HCC) Augmentin, ventolin Follow-up: ENT  Khan Chura Hannah Beat, MD

## 2019-05-30 DIAGNOSIS — R490 Dysphonia: Secondary | ICD-10-CM | POA: Diagnosis not present

## 2019-05-30 DIAGNOSIS — K219 Gastro-esophageal reflux disease without esophagitis: Secondary | ICD-10-CM | POA: Diagnosis not present

## 2019-05-30 DIAGNOSIS — F17210 Nicotine dependence, cigarettes, uncomplicated: Secondary | ICD-10-CM | POA: Diagnosis not present

## 2019-05-30 DIAGNOSIS — R6889 Other general symptoms and signs: Secondary | ICD-10-CM | POA: Diagnosis not present

## 2019-06-02 ENCOUNTER — Telehealth: Payer: Self-pay | Admitting: Family Medicine

## 2019-06-02 DIAGNOSIS — I10 Essential (primary) hypertension: Secondary | ICD-10-CM

## 2019-06-02 DIAGNOSIS — E782 Mixed hyperlipidemia: Secondary | ICD-10-CM

## 2019-06-02 DIAGNOSIS — E038 Other specified hypothyroidism: Secondary | ICD-10-CM

## 2019-06-02 DIAGNOSIS — E559 Vitamin D deficiency, unspecified: Secondary | ICD-10-CM

## 2019-06-02 NOTE — Telephone Encounter (Signed)
Needs fasting lipid, cmp and EGFr and TSH last week in May, pls let her know and order, dx, HTN, hyperlipidemia, hypothyroid Thanks

## 2019-06-03 NOTE — Addendum Note (Signed)
Addended by: Eual Fines on: 06/03/2019 04:08 PM   Modules accepted: Orders

## 2019-06-03 NOTE — Telephone Encounter (Signed)
Left message and ordered labs to be done end of May

## 2019-06-05 ENCOUNTER — Other Ambulatory Visit: Payer: Self-pay

## 2019-06-05 ENCOUNTER — Emergency Department (HOSPITAL_COMMUNITY)
Admission: EM | Admit: 2019-06-05 | Discharge: 2019-06-06 | Disposition: A | Discharge status: Home / Self Care | Payer: Medicare HMO | Attending: Emergency Medicine | Admitting: Emergency Medicine

## 2019-06-05 ENCOUNTER — Encounter (HOSPITAL_COMMUNITY): Payer: Self-pay | Admitting: *Deleted

## 2019-06-05 DIAGNOSIS — I10 Essential (primary) hypertension: Secondary | ICD-10-CM | POA: Insufficient documentation

## 2019-06-05 DIAGNOSIS — E039 Hypothyroidism, unspecified: Secondary | ICD-10-CM | POA: Insufficient documentation

## 2019-06-05 DIAGNOSIS — F17210 Nicotine dependence, cigarettes, uncomplicated: Secondary | ICD-10-CM | POA: Insufficient documentation

## 2019-06-05 DIAGNOSIS — E876 Hypokalemia: Secondary | ICD-10-CM | POA: Diagnosis not present

## 2019-06-05 DIAGNOSIS — Z79899 Other long term (current) drug therapy: Secondary | ICD-10-CM | POA: Diagnosis not present

## 2019-06-05 DIAGNOSIS — R531 Weakness: Secondary | ICD-10-CM | POA: Diagnosis present

## 2019-06-05 DIAGNOSIS — R197 Diarrhea, unspecified: Secondary | ICD-10-CM | POA: Insufficient documentation

## 2019-06-05 LAB — BASIC METABOLIC PANEL
Anion gap: 15 (ref 5–15)
BUN: 14 mg/dL (ref 8–23)
CO2: 28 mmol/L (ref 22–32)
Calcium: 10 mg/dL (ref 8.9–10.3)
Chloride: 91 mmol/L — ABNORMAL LOW (ref 98–111)
Creatinine, Ser: 1.22 mg/dL — ABNORMAL HIGH (ref 0.44–1.00)
GFR calc Af Amer: 55 mL/min — ABNORMAL LOW (ref >60)
GFR calc non Af Amer: 47 mL/min — ABNORMAL LOW (ref >60)
Glucose, Bld: 113 mg/dL — ABNORMAL HIGH (ref 70–99)
Potassium: 3 mmol/L — ABNORMAL LOW (ref 3.5–5.1)
Sodium: 134 mmol/L — ABNORMAL LOW (ref 135–145)

## 2019-06-05 LAB — CBC
HCT: 45 % (ref 36.0–46.0)
Hemoglobin: 15 g/dL (ref 12.0–15.0)
MCH: 27 pg (ref 26.0–34.0)
MCHC: 33.3 g/dL (ref 30.0–36.0)
MCV: 80.9 fL (ref 80.0–100.0)
Platelets: 387 10*3/uL (ref 150–400)
RBC: 5.56 MIL/uL — ABNORMAL HIGH (ref 3.87–5.11)
RDW: 15.1 % (ref 11.5–15.5)
WBC: 16.8 10*3/uL — ABNORMAL HIGH (ref 4.0–10.5)
nRBC: 0 % (ref 0.0–0.2)

## 2019-06-05 LAB — MAGNESIUM: Magnesium: 1.9 mg/dL (ref 1.7–2.4)

## 2019-06-05 MED ORDER — SODIUM CHLORIDE 0.9 % IV BOLUS
1000.0000 mL | Freq: Once | INTRAVENOUS | Status: AC
  Administered 2019-06-05: 1000 mL via INTRAVENOUS

## 2019-06-05 MED ORDER — POTASSIUM CHLORIDE CRYS ER 20 MEQ PO TBCR
40.0000 meq | EXTENDED_RELEASE_TABLET | Freq: Two times a day (BID) | ORAL | Status: DC
  Administered 2019-06-05: 40 meq via ORAL
  Filled 2019-06-05: qty 2

## 2019-06-05 MED ORDER — POTASSIUM CHLORIDE 10 MEQ/100ML IV SOLN
10.0000 meq | INTRAVENOUS | Status: AC
  Administered 2019-06-05 – 2019-06-06 (×3): 10 meq via INTRAVENOUS
  Filled 2019-06-05 (×3): qty 100

## 2019-06-05 MED ORDER — MAGNESIUM SULFATE 2 GM/50ML IV SOLN
2.0000 g | Freq: Once | INTRAVENOUS | Status: AC
  Administered 2019-06-05: 2 g via INTRAVENOUS
  Filled 2019-06-05: qty 50

## 2019-06-05 NOTE — ED Triage Notes (Signed)
Pt c/o generalized weakness and cramping around her navel that radiates to right chest and in her calves that started Friday night. Pt reports last time she had these symptoms her magnesium and calcium was low.

## 2019-06-05 NOTE — ED Notes (Signed)
Patient not able to void at this time

## 2019-06-06 ENCOUNTER — Telehealth: Payer: Self-pay | Admitting: Internal Medicine

## 2019-06-06 ENCOUNTER — Telehealth: Payer: Self-pay

## 2019-06-06 NOTE — Telephone Encounter (Signed)
Spoke with pt. Pt is aware of RMR's recommendations. Pt will call back if she develops any new symptoms. Pt was advised to go back to the ED if her pain worsened, vomiting or lightheadedness started.

## 2019-06-06 NOTE — Discharge Instructions (Signed)
Take your potassium as directed.  Follow up with your GI doctor for recheck

## 2019-06-06 NOTE — Telephone Encounter (Signed)
Place on cancellation list.

## 2019-06-06 NOTE — Telephone Encounter (Signed)
Pt went to the ER for pain around abdomen, and still in pain, wants a nurse to call her

## 2019-06-06 NOTE — ED Provider Notes (Signed)
[facility] EMERGENCY DEPARTMENT Provider Note   CSN: [id number] Arrival date & time: 06/05/19  1734     History Chief Complaint  Patient presents with  . Weakness    Kelly Douglas is a 64 y.o. female.  Pt complains of muscle cramps.  Pt reports that she has a a history of irritable bowel and has recently had diarrhea.  Pt reports she gets muscle cramps when her electolytes get low.   The history is provided by the patient. No language interpreter was used.  Weakness Severity:  Moderate Onset quality:  Gradual Timing:  Constant Progression:  Worsening Chronicity:  New Relieved by:  Nothing Worsened by:  Nothing Ineffective treatments:  None tried Associated symptoms: diarrhea and nausea   Associated symptoms: no chest pain        Past Medical History:  Diagnosis Date  . Anxiety   . Arteriosclerotic cardiovascular disease (ASCVD) 2013   coronary calcification-left main, LAD and CX; small pericardial effusion  . Arthritis   . Chronic back pain   . Chronic bronchitis   . Chronic lung disease    Chronic scarring and volume loss-left lung; characteristics of a chronic infectious process-possible MAI  . Depression   . Dyspnea   . GERD (gastroesophageal reflux disease)    Dr Gala Romney EGD 09/2009->esophagitis, sm HH, antral erosions, atonic esophagus  . Hyperlipidemia   . Hypertension   . Hypothyroid 1981 approx  . Scleroderma ([place])   . Seasonal allergies   . Syncope    Multiple spells over the past 40+ years, likely neurocardiogenic  . Tobacco abuse 06/24/2009    Patient Active Problem List   Diagnosis Date Noted  . Cervical lymphadenopathy 05/16/2019  . Hypomagnesemia 03/28/2018  . S/P bronchoscopy 10/18/2017  . Pulmonary nodules 11/18/2016  . Pollen allergies 05/19/2015  . Nicotine dependence 09/17/2013  . Hoarseness of voice 09/16/2013  . Cervical pain (neck) 06/21/2013  . Back pain with radiation 06/21/2013  . IDA (iron deficiency anemia) 08/10/2012  .  Essential hypertension 02/22/2012  . Insomnia 05/09/2011  . GERD (gastroesophageal reflux disease)   . Scleroderma ([place])   . Acute bronchitis with COPD ([place])   . Hypothyroid   . Arteriosclerotic cardiovascular disease (ASCVD)   . Syncope   . Anxiety and depression 06/24/2009  . Vitamin D deficiency 03/18/2009  . Hyperlipidemia 03/18/2009    Past Surgical History:  Procedure Laterality Date  . BILATERAL SALPINGOOPHORECTOMY  2013    Dr. Elonda Husky; uterus remains in situ  . COLONOSCOPY  09/28/09   anal papilla otherwise normal  . COLONOSCOPY N/A 08/29/2012   [facility]:[id number] polyp-removed as described above. tubular adenoma  . ESOPHAGOGASTRODUODENOSCOPY  10/07/09   Dr. Barrie Dunker of esophageal mucosa, diffusely ?esophagitis (bx benign), small HH/antral erosions, erythema bx benign. 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 ESOPHAGOGASTRODUODENOSCOPY (EGD) WITH ESOPHAGEAL DILATION N/A 03/07/2012   [id number]:[id number], patent, tubular esophagus of uncertain significance-status post biopsy )unremarkable). Hiatal hernia  . FUDUCIAL PLACEMENT  10/18/2017   Procedure: PLACEMENT OF 3 FUDUCIAL INTO LEFT LOWER LOBE OF LUNG-TARGET 1;  Surgeon: Collene Gobble, MD;  Location: [place];  Service: Thoracic;;  . LAPAROSCOPIC APPENDECTOMY  07/16/2011   Procedure: APPENDECTOMY LAPAROSCOPIC;  Surgeon: Donato Heinz, MD;  Location: [facility];  Service: General;  Laterality: N/A;  . TUBAL LIGATION    . VIDEO BRONCHOSCOPY  10/18/2017  . VIDEO BRONCHOSCOPY WITH ENDOBRONCHIAL NAVIGATION N/A 10/18/2017   Procedure: VIDEO BRONCHOSCOPY WITH ENDOBRONCHIAL NAVIGATION;  Surgeon: Collene Gobble, MD;  Location: [facility]  OR;  Service: Thoracic;  Laterality: N/A;     OB History    Gravida  2   Para  2   Term  2   Preterm      AB      Living  2     SAB      TAB      Ectopic      Multiple      Live Births              Family History  Problem Relation Age of Onset  . Stroke Father   . Aneurysm Father   .  Coronary artery disease Brother   . Hypertension Brother   . Down syndrome Brother   . Hypertension Brother   . Hypertension Brother        father/mother  . Aneurysm Mother   . Throat cancer Brother 49       throat cancer  . Hypertension Sister   . Diabetes Brother   . Diabetes Brother   . Anesthesia problems Neg Hx   . Hypotension Neg Hx   . Malignant hyperthermia Neg Hx   . Pseudochol deficiency Neg Hx   . Colon cancer Neg Hx     Social History   Tobacco Use  . Smoking status: Current Every Day Smoker    Packs/day: 0.50    Years: 45.00    Pack years: 22.50    Types: Cigarettes    Start date: 07/14/1971  . Smokeless tobacco: Never Used  . Tobacco comment: 0.5 PPD current plans to reduce   Substance Use Topics  . Alcohol use: Not Currently    Alcohol/week: 0.0 standard drinks    Comment: quit June 2012-used to drink 6 pack daily  . Drug use: No    Home Medications Prior to Admission medications   Medication Sig Start Date End Date Taking? Authorizing Provider  acetaminophen (TYLENOL) 650 MG CR tablet Take 1,300 mg by mouth 2 (two) times daily.     [provider]  amLODipine (NORVASC) 5 MG tablet Take 1 tablet (5 mg total) by mouth daily. 04/17/19   [name], MD  amoxicillin-clavulanate (AUGMENTIN) 875-125 MG tablet Take 1 tablet by mouth 2 (two) times daily. TAKE WITH FOOD 05/16/19   Maryruth Hancock, MD  cholecalciferol (VITAMIN D) 1000 units tablet Take 1,000 Units by mouth 3 (three) times a week.     [provider]  cholestyramine (QUESTRAN) 4 g packet Take 2 gm orally daily. Not to be taken 2 hours of other medications. 04/10/19   Carlis Stable, NP  DULoxetine (CYMBALTA) 60 MG capsule Take 1 capsule (60 mg total) by mouth daily. 04/17/19   [name], MD  gabapentin (NEURONTIN) 600 MG tablet Take 1 tablet (600 mg total) by mouth 2 (two) times daily. 04/17/19   [name], MD  levothyroxine (SYNTHROID) 75 MCG tablet TAKE 1  TABLET BY MOUTH  EVERY OTHER DAY ALTERNATING WITH 1/2 TABLET EVERY OTHER DAY 04/17/19   [name], MD  Lidocaine-Menthol (ICY HOT LIDOCAINE PLUS MENTHOL) 4-1 % PTCH Apply 1 patch topically daily as needed (pain).    [provider]  loperamide (IMODIUM) 2 MG capsule Take 2-4 mg by mouth as needed for diarrhea or loose stools.    [provider]  Magnesium 500 MG CAPS Take 1 capsule by mouth daily.    [provider]  meloxicam (MOBIC) 7.5 MG tablet Take 1 tablet (7.5 mg  total) by mouth daily. 04/17/19   [name], MD  montelukast (SINGULAIR) 10 MG tablet Take 1 tablet (10 mg total) by mouth at bedtime. 04/17/19   [name], MD  pantoprazole (PROTONIX) 40 MG tablet Take 1 tablet (40 mg total) by mouth 2 (two) times daily. 04/17/19   [name], MD  potassium chloride (KLOR-CON 10) 10 MEQ tablet Take 1 tablet (10 mEq total) by mouth daily. 04/17/19   [name], MD  pravastatin (PRAVACHOL) 40 MG tablet Take 1 tablet (40 mg total) by mouth daily. 04/17/19   [name], MD  PROAIR RESPICLICK 161 [phone] Base) MCG/ACT AEPB Inhale 2 puffs into the lungs 4 (four) times daily as needed. 08/07/18   Collene Gobble, MD  traZODone (DESYREL) 50 MG tablet Take one tablet every night at bedtime , by mouth, for sleep 10/30/18   [name], MD    Allergies    Aspirin  Review of Systems   Review of Systems  Cardiovascular: Negative for chest pain.  Gastrointestinal: Positive for diarrhea and nausea.  Neurological: Positive for weakness.  All other systems reviewed and are negative.   Physical Exam Updated Vital Signs BP 140/82   Pulse 85   Temp 98.5 F (36.9 C) (Oral)   Resp (!) 21   Ht 5\' 8"  (1.727 m)   Wt 65.8 kg   SpO2 97%   BMI 22.05 kg/m   Physical Exam Vitals and nursing note reviewed.  Constitutional:      Appearance: She is well-developed.  HENT:     Head: Normocephalic.     Right Ear: Tympanic  membrane normal.     Left Ear: Tympanic membrane normal.     Nose: Nose normal.  Cardiovascular:     Rate and Rhythm: Normal rate.  Pulmonary:     Effort: Pulmonary effort is normal.  Abdominal:     General: There is no distension.  Musculoskeletal:        General: Normal range of motion.     Cervical back: Normal range of motion.  Skin:    General: Skin is warm.  Neurological:     General: No focal deficit present.     Mental Status: She is alert and oriented to person, place, and time.  Psychiatric:        Mood and Affect: Mood normal.     ED Results / Procedures / Treatments   Labs (all labs ordered are listed, but only abnormal results are displayed) Labs Reviewed  CBC - Abnormal; Notable for the following components:      Result Value   WBC 16.8 (*)    RBC 5.56 (*)    All other components within normal limits  BASIC METABOLIC PANEL - Abnormal; Notable for the following components:   Sodium 134 (*)    Potassium 3.0 (*)    Chloride 91 (*)    Glucose, Bld 113 (*)    Creatinine, Ser 1.22 (*)    GFR calc non Af Amer 47 (*)    GFR calc Af Amer 55 (*)    All other components within normal limits  MAGNESIUM    EKG None  Radiology No results found.  Procedures Procedures (including critical care time)  Medications Ordered in ED Medications  sodium chloride 0.9 % bolus 1,000 mL (0 mLs Intravenous Stopped 06/05/19 2252)  potassium chloride 10 mEq in 100 mL IVPB (0 mEq Intravenous Stopped 06/06/19 0123)  magnesium sulfate IVPB 2 g  50 mL (0 g Intravenous Stopped 06/05/19 2328)    ED Course  I have reviewed the triage vital signs and the nursing notes.  Pertinent labs & imaging results that were available during my care of the patient were reviewed by me and considered in my medical decision making (see chart for details).    MDM Rules/Calculators/A&P                      MDM:  Pt given Iv fluids, potassium po and iv.  Pt reports she feels much better.  Pt  advised to follow up with her MD for recheck  Final Clinical Impression(s) / ED Diagnoses Final diagnoses:  Diarrhea, unspecified type  Hypokalemia    Rx / DC Orders ED Discharge Orders    None    An After Visit Summary was printed and given to the patient.    Sidney Ace 06/06/19 Cecille Amsterdam    Dorie Rank, MD 06/09/19 1714

## 2019-06-06 NOTE — Telephone Encounter (Signed)
Pt was seen at the ED last night. Pt was seen for Hypokalemia and diarrhea. Pt was given Potassium and Magnesium while in the hospital. Pt is having some tenderness around her navel. Pt states she mentioned it at her last apt and that area is still painful. The pain wakes pt up in the middle of the night. Pt isn't sure what she can take for pain. Pt took a Tylenol and feels it didn't do much.

## 2019-06-06 NOTE — Telephone Encounter (Signed)
States she has been catching cramps in her calfs and in her abdomen and it makes around her navel sore and she wants to know if there is anything she can take for the cramps because they wake her up all during the night

## 2019-06-06 NOTE — Telephone Encounter (Signed)
[phone] please call patient about her belly button pain.  Went to the er and wants to know what she can take for the pain

## 2019-06-06 NOTE — Telephone Encounter (Signed)
Symptoms noted.  I think she just needs to have an office visit the next week to figure out what is going on so we can direct valuation/management.Kelly Douglas

## 2019-06-07 NOTE — Telephone Encounter (Signed)
Lets do a phone visit with her about this. Thank you

## 2019-06-10 NOTE — Telephone Encounter (Signed)
Noted  

## 2019-06-10 NOTE — Telephone Encounter (Signed)
Added to cancellation list

## 2019-06-10 NOTE — Telephone Encounter (Signed)
Called both numbers to schedule a visit and voicemail full

## 2019-06-12 NOTE — Telephone Encounter (Signed)
Needs appt to address concerns

## 2019-06-13 ENCOUNTER — Other Ambulatory Visit: Payer: Self-pay | Admitting: Family Medicine

## 2019-06-13 DIAGNOSIS — M19019 Primary osteoarthritis, unspecified shoulder: Secondary | ICD-10-CM

## 2019-06-24 ENCOUNTER — Ambulatory Visit: Payer: Medicare HMO | Admitting: Family Medicine

## 2019-06-24 NOTE — Telephone Encounter (Signed)
Pt had an appt 06-24-19 but did not show

## 2019-07-10 ENCOUNTER — Ambulatory Visit: Payer: Medicare HMO | Admitting: Gastroenterology

## 2019-08-05 ENCOUNTER — Ambulatory Visit (HOSPITAL_COMMUNITY)
Admission: RE | Admit: 2019-08-05 | Discharge: 2019-08-05 | Disposition: A | Discharge status: Home / Self Care | Payer: Medicare HMO | Source: Ambulatory Visit | Attending: Emergency Medicine | Admitting: Emergency Medicine

## 2019-08-05 ENCOUNTER — Other Ambulatory Visit: Payer: Self-pay

## 2019-08-05 DIAGNOSIS — J984 Other disorders of lung: Secondary | ICD-10-CM | POA: Diagnosis not present

## 2019-08-05 DIAGNOSIS — K228 Other specified diseases of esophagus: Secondary | ICD-10-CM | POA: Diagnosis not present

## 2019-08-05 DIAGNOSIS — I700 Atherosclerosis of aorta: Secondary | ICD-10-CM | POA: Diagnosis not present

## 2019-08-05 DIAGNOSIS — R911 Solitary pulmonary nodule: Secondary | ICD-10-CM | POA: Insufficient documentation

## 2019-08-05 DIAGNOSIS — I2510 Atherosclerotic heart disease of native coronary artery without angina pectoris: Secondary | ICD-10-CM | POA: Diagnosis not present

## 2019-08-20 ENCOUNTER — Other Ambulatory Visit: Payer: Self-pay | Admitting: Family Medicine

## 2019-08-20 DIAGNOSIS — M19019 Primary osteoarthritis, unspecified shoulder: Secondary | ICD-10-CM

## 2019-09-03 ENCOUNTER — Other Ambulatory Visit: Payer: Self-pay

## 2019-09-03 ENCOUNTER — Ambulatory Visit: Payer: Medicare HMO | Admitting: Gastroenterology

## 2019-09-03 ENCOUNTER — Encounter: Payer: Self-pay | Admitting: Gastroenterology

## 2019-09-03 DIAGNOSIS — Z86010 Personal history of colonic polyps: Secondary | ICD-10-CM

## 2019-09-03 NOTE — Patient Instructions (Signed)
1. Recommend you pursue covid vaccinations (Moderna). You will need both shots. 2. Colonoscopy to be scheduled. See separate instructions.  3. Continue to monitor your weight. Increase your food intake. If you need to add supplemental drinks, Premier Protein is a good source for low sugar/high protein drinks.

## 2019-09-03 NOTE — Progress Notes (Signed)
Primary Care Physician:  [name], MD  Primary Gastroenterologist:  Garfield Cornea, MD   Chief Complaint  Patient presents with  . Follow-up    diarrhea better, consult for TCS    HPI:  Kelly Douglas is a 64 y.o. female here for follow-up of diarrhea, she is due for colonoscopy as well.  She was last seen in the office in March 2021.  She has a history of GERD, colonic adenomas, altered bowel function.  Patient states she was given Augmentin for some dental issues several months ago and ever since then her diarrhea resolved.  She has had diarrhea for the past 6 years per her report.  She is no longer on Questran.  She has a bowel movement once daily.  No blood in the stool or melena.  No abdominal pain.  Heartburn well controlled on pantoprazole.  No dysphagia.  Said she lost some weight earlier in the year when she was sick back in May, about 10 pounds.  States her weight has stabilized.  Chest CT without contrast August 05, 2019 showed dilated, patulous appearing esophagus with air-fluid level identified just below the level of the carina.  No esophageal mass identified.  Other pulmonary findings being managed by Dr. Lamonte Sakai her pulmonologist.   Current Outpatient Medications  Medication Sig Dispense Refill  . acetaminophen (TYLENOL) 650 MG CR tablet Take 1,300 mg by mouth as needed for pain.     Marland Kitchen amLODipine (NORVASC) 5 MG tablet Take 1 tablet (5 mg total) by mouth daily. 90 tablet 3  . cholecalciferol (VITAMIN D) 1000 units tablet Take 1,000 Units by mouth 3 (three) times a week.     . DULoxetine (CYMBALTA) 60 MG capsule TAKE 1 CAPSULE EVERY DAY 90 capsule 0  . levothyroxine (SYNTHROID) 75 MCG tablet TAKE 1 TABLET BY MOUTH  EVERY OTHER DAY ALTERNATING WITH 1/2 TABLET EVERY OTHER DAY 68 tablet 0  . Lidocaine-Menthol (ICY HOT LIDOCAINE PLUS MENTHOL) 4-1 % PTCH Apply 1 patch topically daily as needed (pain).    . Magnesium 500 MG CAPS Take 1 capsule by mouth daily.    . meloxicam  (MOBIC) 7.5 MG tablet TAKE 1 TABLET EVERY DAY 90 tablet 0  . montelukast (SINGULAIR) 10 MG tablet Take 1 tablet (10 mg total) by mouth at bedtime. 90 tablet 1  . pantoprazole (PROTONIX) 40 MG tablet TAKE 1 TABLET TWICE DAILY 180 tablet 0  . potassium chloride (KLOR-CON 10) 10 MEQ tablet Take 1 tablet (10 mEq total) by mouth daily. 90 tablet 3  . pravastatin (PRAVACHOL) 40 MG tablet Take 1 tablet (40 mg total) by mouth daily. 90 tablet 3  . PROAIR RESPICLICK 010 (90 Base) MCG/ACT AEPB Inhale 2 puffs into the lungs 4 (four) times daily as needed. 1 each 5   No current facility-administered medications for this visit.    Allergies as of 09/03/2019 - Review Complete 09/03/2019  Allergen Reaction Noted  . Aspirin Other (See Comments) 05/19/2015    Past Medical History:  Diagnosis Date  . Anxiety   . Arteriosclerotic cardiovascular disease (ASCVD) 2013   coronary calcification-left main, LAD and CX; small pericardial effusion  . Arthritis   . Chronic back pain   . Chronic bronchitis   . Chronic lung disease    Chronic scarring and volume loss-left lung; characteristics of a chronic infectious process-possible MAI  . Depression   . Dyspnea   . GERD (gastroesophageal reflux disease)    Dr Gala Romney EGD 09/2009->esophagitis, sm HH,  antral erosions, atonic esophagus  . Hyperlipidemia   . Hypertension   . Hypothyroid 1981 approx  . Scleroderma ([place])   . Seasonal allergies   . Syncope    Multiple spells over the past 40+ years, likely neurocardiogenic  . Tobacco abuse 06/24/2009    Past Surgical History:  Procedure Laterality Date  . BILATERAL SALPINGOOPHORECTOMY  2013    Dr. Elonda Husky; uterus remains in situ  . COLONOSCOPY  09/28/09   anal papilla otherwise normal  . COLONOSCOPY N/A 08/29/2012   [facility]:ENIDPOE polyp-removed as described above. tubular adenoma  . ESOPHAGOGASTRODUODENOSCOPY  10/07/09   Dr. Barrie Dunker of esophageal mucosa, diffusely ?esophagitis (bx benign), small HH/antral  erosions, erythema bx benign. 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 ESOPHAGOGASTRODUODENOSCOPY (EGD) WITH ESOPHAGEAL DILATION N/A 03/07/2012   [facility]:[id number], patent, tubular esophagus of uncertain significance-status post biopsy )unremarkable). Hiatal hernia  . FUDUCIAL PLACEMENT  10/18/2017   Procedure: PLACEMENT OF 3 FUDUCIAL INTO LEFT LOWER LOBE OF LUNG-TARGET 1;  Surgeon: Collene Gobble, MD;  Location: [place];  Service: Thoracic;;  . LAPAROSCOPIC APPENDECTOMY  07/16/2011   Procedure: APPENDECTOMY LAPAROSCOPIC;  Surgeon: Donato Heinz, MD;  Location: [facility];  Service: General;  Laterality: N/A;  . TUBAL LIGATION    . VIDEO BRONCHOSCOPY  10/18/2017  . VIDEO BRONCHOSCOPY WITH ENDOBRONCHIAL NAVIGATION N/A 10/18/2017   Procedure: VIDEO BRONCHOSCOPY WITH ENDOBRONCHIAL NAVIGATION;  Surgeon: Collene Gobble, MD;  Location: [facility];  Service: Thoracic;  Laterality: N/A;    Family History  Problem Relation Age of Onset  . Stroke Father   . Aneurysm Father   . Coronary artery disease Brother   . Hypertension Brother   . Down syndrome Brother   . Hypertension Brother   . Hypertension Brother        father/mother  . Aneurysm Mother   . Throat cancer Brother 49       throat cancer  . Hypertension Sister   . Diabetes Brother   . Diabetes Brother   . Anesthesia problems Neg Hx   . Hypotension Neg Hx   . Malignant hyperthermia Neg Hx   . Pseudochol deficiency Neg Hx   . Colon cancer Neg Hx     Social History   Socioeconomic History  . Marital status: Married    Spouse name: Not on file  . Number of children: 2  . Years of education: Not on file  . Highest education level: Not on file  Occupational History  . Occupation: disabled    Fish farm manager: NOT EMPLOYED  Tobacco Use  . Smoking status: Current Every Day Smoker    Packs/day: 0.50    Years: 45.00    Pack years: 22.50    Types: Cigarettes    Start date: 07/14/1971  . Smokeless tobacco: Never Used  . Tobacco comment: 0.5 PPD  current plans to reduce   Vaping Use  . Vaping Use: Never used  Substance and Sexual Activity  . Alcohol use: Not Currently    Alcohol/week: 0.0 standard drinks    Comment: quit June 2012-used to drink 6 pack daily  . Drug use: No  . Sexual activity: Yes    Birth control/protection: Post-menopausal  Other Topics Concern  . Not on file  Social History Narrative  . Not on file   Social Determinants of Health   Financial Resource Strain:   . Difficulty of Paying Living Expenses: Not on file  Food Insecurity:   . Worried About Charity fundraiser in the Last Year:  Not on file  . Ran Out of Food in the Last Year: Not on file  Transportation Needs:   . Lack of Transportation (Medical): Not on file  . Lack of Transportation (Non-Medical): Not on file  Physical Activity:   . Days of Exercise per Week: Not on file  . Minutes of Exercise per Session: Not on file  Stress:   . Feeling of Stress : Not on file  Social Connections:   . Frequency of Communication with Friends and Family: Not on file  . Frequency of Social Gatherings with Friends and Family: Not on file  . Attends Religious Services: Not on file  . Active Member of Clubs or Organizations: Not on file  . Attends Archivist Meetings: Not on file  . Marital Status: Not on file  Intimate Partner Violence:   . Fear of Current or Ex-Partner: Not on file  . Emotionally Abused: Not on file  . Physically Abused: Not on file  . Sexually Abused: Not on file      ROS:  General: Negative for anorexia,   fever, chills, fatigue, weakness.  See HPI Eyes: Negative for vision changes.  ENT: Negative for hoarseness, difficulty swallowing , nasal congestion. CV: Negative for chest pain, angina, palpitations, dyspnea on exertion, peripheral edema.  Respiratory: Negative for dyspnea at rest, dyspnea on exertion, cough, sputum, wheezing.  GI: See history of present illness. GU:  Negative for dysuria, hematuria, urinary  incontinence, urinary frequency, nocturnal urination.  MS: Negative for joint pain, low back pain.  Derm: Negative for rash or itching.  Neuro: Negative for weakness, abnormal sensation, seizure, frequent headaches, memory loss, confusion.  Psych: Negative for anxiety, depression, suicidal ideation, hallucinations.  Endo: Negative for unusual weight change.  Heme: Negative for bruising or bleeding. Allergy: Negative for rash or hives.    Physical Examination:  BP (!) 159/89   Pulse (!) 119   Temp 99.7 F (37.6 C) (Oral)   Ht 5\' 8"  (1.727 m)   Wt 136 lb 9.6 oz (62 kg)   BMI 20.77 kg/m    General: Well-nourished, well-developed in no acute distress.  Head: Normocephalic, atraumatic.   Eyes: Conjunctiva pink, no icterus. Mouth: masked Neck: Supple without thyromegaly, masses, or lymphadenopathy.  Lungs: Clear to auscultation bilaterally.  Heart: Regular rate and rhythm, no murmurs rubs or gallops.  Abdomen: Bowel sounds are normal, nontender, nondistended, no hepatosplenomegaly or masses, no abdominal bruits or    hernia , no rebound or guarding.   Rectal: Not performed Extremities: No lower extremity edema. No clubbing or deformities.  Neuro: Alert and oriented x 4 , grossly normal neurologically.  Skin: Warm and dry, no rash or jaundice.   Psych: Alert and cooperative, normal mood and affect.  Labs: Lab Results  Component Value Date   CREATININE 1.22 (H) 06/05/2019   BUN 14 06/05/2019   NA 134 (L) 06/05/2019   K 3.0 (L) 06/05/2019   CL 91 (L) 06/05/2019   CO2 28 06/05/2019   Lab Results  Component Value Date   ALT 23 12/25/2018   AST 29 12/25/2018   ALKPHOS 103 12/25/2018   BILITOT 0.4 12/25/2018   Lab Results  Component Value Date   WBC 16.8 (H) 06/05/2019   HGB 15.0 06/05/2019   HCT 45.0 06/05/2019   MCV 80.9 06/05/2019   PLT 387 06/05/2019     Imaging Studies: No results found.  Impression/plan:  Pleasant 64 year old female with past medical  history significant for scleroderma, hypertension,  GERD, chronic lung disease, history of colonic adenomas presenting to schedule surveillance colonoscopy.  Interestingly she has a history of diarrhea for more than 5 years, recently resolved after taking round of antibiotics for dental issues.  Question if etiology was related to small intestinal bacterial overgrowth in the setting of duodenal diverticulum.  At any rate she is now having regular bowel movements without any antidiarrheal medications.  She is due for surveillance colonoscopy.  We will plan for colonoscopy with assistance from anesthesia given polypharmacy/chronic lung disease. ASA III.  I have discussed the risks, alternatives, benefits with regards to but not limited to the risk of reaction to medication, bleeding, infection, perforation and the patient is agreeable to proceed. Written consent to be obtained.  Back in May she had illness with 10 pound weight loss.  She states that her weight has stabilized.  She is trying to increase her weight, increase oral intake/nutrition.  Encouraged adding high-protein supplemental drink such as Premier protein.  Continue to monitor weight at home.  If she has additional weight loss, she should let us know. Recent chest CT with dilated patulous appearing esophagus with air-fluid level but no mass. ?related to scleroderma. Patient denies dysphagia. Last EGD 2014. To discuss with Dr. Gala Romney.

## 2019-09-05 NOTE — Progress Notes (Signed)
Cc'ed to pcp °

## 2019-09-06 ENCOUNTER — Telehealth: Payer: Self-pay | Admitting: *Deleted

## 2019-09-06 NOTE — Telephone Encounter (Signed)
Called pt and scheduled TCS with propofol with Dr. Gala Romney, asa 3 for 10/25 at 2:30pm. Patient aware will need pre-op/covid test prior. Advised will mail with prep istructions. She voiced understanding. Confirmed mailing address.

## 2019-09-09 ENCOUNTER — Telehealth: Payer: Self-pay | Admitting: *Deleted

## 2019-09-09 ENCOUNTER — Encounter: Payer: Self-pay | Admitting: *Deleted

## 2019-09-09 NOTE — Telephone Encounter (Signed)
PA for TCS approved via [employer]. Auth# [id number] Dates 11/11/19-12/11/19

## 2019-09-12 NOTE — Progress Notes (Signed)
Discussed with Dr. Gala Romney. CT findings with regards to esophagus noted. Patient asymptomatic. Follow clinically.   I have also reached out to Dr. Lamonte Sakai regarding chest CT findings to make sure patient has appropriate follow up of enlarging pulmonary nodule.

## 2019-09-18 ENCOUNTER — Other Ambulatory Visit (HOSPITAL_COMMUNITY): Payer: Self-pay | Admitting: Family Medicine

## 2019-09-18 ENCOUNTER — Encounter: Payer: Self-pay | Admitting: Family Medicine

## 2019-09-18 ENCOUNTER — Ambulatory Visit (INDEPENDENT_AMBULATORY_CARE_PROVIDER_SITE_OTHER): Payer: Medicare HMO | Admitting: Family Medicine

## 2019-09-18 ENCOUNTER — Other Ambulatory Visit: Payer: Self-pay

## 2019-09-18 VITALS — BP 133/81 | HR 98 | Resp 16 | Ht 68.0 in | Wt 138.1 lb

## 2019-09-18 DIAGNOSIS — F5104 Psychophysiologic insomnia: Secondary | ICD-10-CM | POA: Diagnosis not present

## 2019-09-18 DIAGNOSIS — F17218 Nicotine dependence, cigarettes, with other nicotine-induced disorders: Secondary | ICD-10-CM | POA: Diagnosis not present

## 2019-09-18 DIAGNOSIS — Z23 Encounter for immunization: Secondary | ICD-10-CM | POA: Diagnosis not present

## 2019-09-18 DIAGNOSIS — R9389 Abnormal findings on diagnostic imaging of other specified body structures: Secondary | ICD-10-CM | POA: Diagnosis not present

## 2019-09-18 DIAGNOSIS — Z1231 Encounter for screening mammogram for malignant neoplasm of breast: Secondary | ICD-10-CM

## 2019-09-18 DIAGNOSIS — Z0000 Encounter for general adult medical examination without abnormal findings: Secondary | ICD-10-CM

## 2019-09-18 DIAGNOSIS — R911 Solitary pulmonary nodule: Secondary | ICD-10-CM

## 2019-09-18 MED ORDER — HYDROXYZINE HCL 25 MG PO TABS: ORAL_TABLET | ORAL | 2 refills | Status: DC

## 2019-09-18 NOTE — Progress Notes (Signed)
    Kelly Douglas     MRN: [id number]      DOB: 1955-03-27  HPI: Patient is in for annual physical exam. C/o inabilty to sleep despite medication, which is chronic. Still smoking about 10/day, and states unable  to set a quit date as she is very stressed out Recent labs, if available are reviewed. Immunization is reviewed , and  updated if needed.   PE: BP 133/81   Pulse 98   Resp 16   Ht 5\' 8"  (1.727 m)   Wt 138 lb 1.3 oz (62.6 kg)   SpO2 99%   BMI 20.99 kg/m   Pleasant  female, alert and oriented x 3, in no cardio-pulmonary distress. Afebrile. HEENT No facial trauma or asymetry. Sinuses non tender.  Extra occullar muscles intact.. External ears normal, . Neck: supple, no adenopathy,JVD or thyromegaly.No bruits.  Chest: Clear to ascultation bilaterally.No crackles or wheezes. Non tender to palpation  Breast: No asymetry,no masses or lumps. No tenderness. No nipple discharge or inversion. No axillary or supraclavicular adenopathy  Cardiovascular system; Heart sounds normal,  S1 and  S2 ,no S3.  No murmur, or thrill. Apical beat not displaced Peripheral pulses normal.  Abdomen: Soft, non tender, no organomegaly or masses. No bruits. Bowel sounds normal. No guarding, tenderness or rebound.   .   Musculoskeletal exam: Full ROM of spine, hips , shoulders and knees. No deformity ,swelling or crepitus noted. No muscle wasting or atrophy.   Neurologic: Cranial nerves 2 to 12 intact. Power, tone ,sensation and reflexes normal throughout. No disturbance in gait. No tremor.  Skin: Intact, no ulceration, erythema , scaling or rash noted. Pigmentation normal throughout  Psych; Normal mood and affect. Judgement and concentration normal   Assessment & Plan:  Annual physical exam Annual exam as documented. Counseling done  re healthy lifestyle involving commitment to 150 minutes exercise per week, heart healthy diet, and attaining healthy weight.The  importance of adequate sleep also discussed. Regular seat belt use and home safety, is also discussed. Changes in health habits are decided on by the patient with goals and time frames  set for achieving them. Immunization and cancer screening needs are specifically addressed at this visit.   Nicotine dependence Asked:confirms currently smokes cigarettes Assess: Unwilling to set a quit date, but is cutting back Advise: needs to QUIT to reduce risk of cancer, cardio and cerebrovascular disease Assist: counseled for 5 minutes and literature provided Arrange: follow up in 2 to 4 months   Insomnia Sleep hygiene reviewed and written information offered also. Prescription sent for  medication needed. Currently uncontrolled , new medication started   Nodule of lower lobe of left lung abnormal chest scan showing growth in 2021, refer to Dr Lamonte Sakai

## 2019-09-18 NOTE — Assessment & Plan Note (Signed)
Asked:confirms currently smokes cigarettes °Assess: Unwilling to set a quit date, but is cutting back °Advise: needs to QUIT to reduce risk of cancer, cardio and cerebrovascular disease °Assist: counseled for 5 minutes and literature provided °Arrange: follow up in 2 to 4 months ° °

## 2019-09-18 NOTE — Patient Instructions (Addendum)
F/U phone/ video visit with MD re sleep and smoking in 8 weeks, call if you need me sooner  Flu vaccine today  Please get Covid vaccines  in the next 1 to 2 weeks  Please schedule mammogram at checkout  You are referred to Dr Lamonte Sakai to f/u lung nodule  New for sleep is bedtime hydroxyzine  Be careful , no more falls!     You need to commit to  quitting smoking with your goal of living to 89 and independently!   Fasting CBC, lipid, cmp and EGFR , TSH and vit D withing the next 5 days if possible  Think about what you will eat, plan ahead. Choose " clean, green, fresh or frozen" over canned, processed or packaged foods which are more sugary, salty and fatty. 70 to 75% of food eaten should be vegetables and fruit. Three meals at set times with snacks allowed between meals, but they must be fruit or vegetables. Aim to eat over a 12 hour period , example 7 am to 7 pm, and STOP after  your last meal of the day. Drink water,generally about 64 ounces per day, no other drink is as healthy. Fruit juice is best enjoyed in a healthy way, by EATING the fruit. It is important that you exercise regularly at least 30 minutes 5 times a week. If you develop chest pain, have severe difficulty breathing, or feel very tired, stop exercising immediately and seek medical attention  Thanks for choosing [facility], we consider it a privelige to serve you.  Insomnia Insomnia is a sleep disorder that makes it difficult to fall asleep or stay asleep. Insomnia can cause fatigue, low energy, difficulty concentrating, mood swings, and poor performance at work or school. There are three different ways to classify insomnia:  Difficulty falling asleep.  Difficulty staying asleep.  Waking up too early in the morning. Any type of insomnia can be long-term (chronic) or short-term (acute). Both are common. Short-term insomnia usually lasts for three months or less. Chronic insomnia occurs at least  three times a week for longer than three months. What are the causes? Insomnia may be caused by another condition, situation, or substance, such as:  Anxiety.  Certain medicines.  Gastroesophageal reflux disease (GERD) or other gastrointestinal conditions.  Asthma or other breathing conditions.  Restless legs syndrome, sleep apnea, or other sleep disorders.  Chronic pain.  Menopause.  Stroke.  Abuse of alcohol, tobacco, or illegal drugs.  Mental health conditions, such as depression.  Caffeine.  Neurological disorders, such as Alzheimer's disease.  An overactive thyroid (hyperthyroidism). Sometimes, the cause of insomnia may not be known. What increases the risk? Risk factors for insomnia include:  Gender. Women are affected more often than men.  Age. Insomnia is more common as you get older.  Stress.  Lack of exercise.  Irregular work schedule or working night shifts.  Traveling between different time zones.  Certain medical and mental health conditions. What are the signs or symptoms? If you have insomnia, the main symptom is having trouble falling asleep or having trouble staying asleep. This may lead to other symptoms, such as:  Feeling fatigued or having low energy.  Feeling nervous about going to sleep.  Not feeling rested in the morning.  Having trouble concentrating.  Feeling irritable, anxious, or depressed. How is this diagnosed? This condition may be diagnosed based on:  Your symptoms and medical history. Your health care provider may ask about: ? Your sleep habits. ? Any  medical conditions you have. ? Your mental health.  A physical exam. How is this treated? Treatment for insomnia depends on the cause. Treatment may focus on treating an underlying condition that is causing insomnia. Treatment may also include:  Medicines to help you sleep.  Counseling or therapy.  Lifestyle adjustments to help you sleep better. Follow these  instructions at home: Eating and drinking   Limit or avoid alcohol, caffeinated beverages, and cigarettes, especially close to bedtime. These can disrupt your sleep.  Do not eat a large meal or eat spicy foods right before bedtime. This can lead to digestive discomfort that can make it hard for you to sleep. Sleep habits   Keep a sleep diary to help you and your health care provider figure out what could be causing your insomnia. Write down: ? When you sleep. ? When you wake up during the night. ? How well you sleep. ? How rested you feel the next day. ? Any side effects of medicines you are taking. ? What you eat and drink.  Make your bedroom a dark, comfortable place where it is easy to fall asleep. ? Put up shades or blackout curtains to block light from outside. ? Use a white noise machine to block noise. ? Keep the temperature cool.  Limit screen use before bedtime. This includes: ? Watching TV. ? Using your smartphone, tablet, or computer.  Stick to a routine that includes going to bed and waking up at the same times every day and night. This can help you fall asleep faster. Consider making a quiet activity, such as reading, part of your nighttime routine.  Try to avoid taking naps during the day so that you sleep better at night.  Get out of bed if you are still awake after 15 minutes of trying to sleep. Keep the lights down, but try reading or doing a quiet activity. When you feel sleepy, go back to bed. General instructions  Take over-the-counter and prescription medicines only as told by your health care provider.  Exercise regularly, as told by your health care provider. Avoid exercise starting several hours before bedtime.  Use relaxation techniques to manage stress. Ask your health care provider to suggest some techniques that may work well for you. These may include: ? Breathing exercises. ? Routines to release muscle tension. ? Visualizing peaceful  scenes.  Make sure that you drive carefully. Avoid driving if you feel very sleepy.  Keep all follow-up visits as told by your health care provider. This is important. Contact a health care provider if:  You are tired throughout the day.  You have trouble in your daily routine due to sleepiness.  You continue to have sleep problems, or your sleep problems get worse. Get help right away if:  You have serious thoughts about hurting yourself or someone else. If you ever feel like you may hurt yourself or others, or have thoughts about taking your own life, get help right away. You can go to your nearest emergency department or call:  Your local emergency services (911 in the [place]  A suicide crisis helpline, such as the [place] at 724-387-1936. This is open 24 hours a day. Summary  Insomnia is a sleep disorder that makes it difficult to fall asleep or stay asleep.  Insomnia can be long-term (chronic) or short-term (acute).  Treatment for insomnia depends on the cause. Treatment may focus on treating an underlying condition that is causing insomnia.  Keep a sleep  diary to help you and your health care provider figure out what could be causing your insomnia. This information is not intended to replace advice given to you by your health care provider. Make sure you discuss any questions you have with your health care provider. Document Revised: 12/16/2016 Document Reviewed: 10/13/2016 Elsevier Patient Education  2020 [employer].

## 2019-09-18 NOTE — Assessment & Plan Note (Signed)

## 2019-09-18 NOTE — Assessment & Plan Note (Signed)
abnormal chest scan showing growth in 2021, refer to Dr Lamonte Sakai

## 2019-09-18 NOTE — Assessment & Plan Note (Addendum)
Sleep hygiene reviewed and written information offered also. Prescription sent for  medication needed. Currently uncontrolled , new medication started

## 2019-09-20 ENCOUNTER — Other Ambulatory Visit: Payer: Self-pay | Admitting: *Deleted

## 2019-09-20 DIAGNOSIS — I10 Essential (primary) hypertension: Secondary | ICD-10-CM

## 2019-09-20 DIAGNOSIS — R748 Abnormal levels of other serum enzymes: Secondary | ICD-10-CM | POA: Diagnosis not present

## 2019-09-20 DIAGNOSIS — E782 Mixed hyperlipidemia: Secondary | ICD-10-CM | POA: Diagnosis not present

## 2019-09-21 ENCOUNTER — Encounter: Payer: Self-pay | Admitting: Family Medicine

## 2019-09-21 LAB — LIPID PANEL
Chol/HDL Ratio: 4.3 ratio (ref 0.0–4.4)
Cholesterol, Total: 132 mg/dL (ref 100–199)
HDL: 31 mg/dL — ABNORMAL LOW (ref >39)
LDL Chol Calc (NIH): 42 mg/dL (ref 0–99)
Triglycerides: 398 mg/dL — ABNORMAL HIGH (ref 0–149)
VLDL Cholesterol Cal: 59 mg/dL — ABNORMAL HIGH (ref 5–40)

## 2019-09-21 LAB — CMP14+EGFR
ALT: 38 IU/L — ABNORMAL HIGH (ref 0–32)
AST: 61 IU/L — ABNORMAL HIGH (ref 0–40)
Albumin/Globulin Ratio: 1.6 (ref 1.2–2.2)
Albumin: 4.7 g/dL (ref 3.8–4.8)
Alkaline Phosphatase: 120 IU/L (ref 48–121)
BUN/Creatinine Ratio: 6 — ABNORMAL LOW (ref 12–28)
BUN: 5 mg/dL — ABNORMAL LOW (ref 8–27)
Bilirubin Total: 0.3 mg/dL (ref 0.0–1.2)
CO2: 24 mmol/L (ref 20–29)
Calcium: 9.3 mg/dL (ref 8.7–10.3)
Chloride: 99 mmol/L (ref 96–106)
Creatinine, Ser: 0.8 mg/dL (ref 0.57–1.00)
GFR calc Af Amer: 90 mL/min/{1.73_m2} (ref >59)
GFR calc non Af Amer: 78 mL/min/{1.73_m2} (ref >59)
Globulin, Total: 2.9 g/dL (ref 1.5–4.5)
Glucose: 92 mg/dL (ref 65–99)
Potassium: 3.9 mmol/L (ref 3.5–5.2)
Sodium: 140 mmol/L (ref 134–144)
Total Protein: 7.6 g/dL (ref 6.0–8.5)

## 2019-09-21 LAB — TSH: TSH: 1.41 u[IU]/mL (ref 0.450–4.500)

## 2019-09-24 ENCOUNTER — Other Ambulatory Visit: Payer: Self-pay

## 2019-09-24 DIAGNOSIS — R748 Abnormal levels of other serum enzymes: Secondary | ICD-10-CM

## 2019-09-25 ENCOUNTER — Other Ambulatory Visit: Payer: Self-pay

## 2019-09-25 ENCOUNTER — Ambulatory Visit (HOSPITAL_COMMUNITY)
Admission: RE | Admit: 2019-09-25 | Discharge: 2019-09-25 | Disposition: A | Discharge status: Home / Self Care | Payer: Medicare HMO | Source: Ambulatory Visit | Attending: Family Medicine | Admitting: Family Medicine

## 2019-09-25 DIAGNOSIS — Z1231 Encounter for screening mammogram for malignant neoplasm of breast: Secondary | ICD-10-CM | POA: Insufficient documentation

## 2019-10-11 ENCOUNTER — Telehealth: Payer: Self-pay | Admitting: Emergency Medicine

## 2019-10-11 ENCOUNTER — Ambulatory Visit: Payer: Medicare HMO | Admitting: Emergency Medicine

## 2019-10-11 NOTE — Telephone Encounter (Signed)
Left pt vm to return call to office to reschedule missed appointment today. If pt returns call she needs to be seen ASAP by RB

## 2019-10-15 DIAGNOSIS — R748 Abnormal levels of other serum enzymes: Secondary | ICD-10-CM | POA: Diagnosis not present

## 2019-10-16 ENCOUNTER — Other Ambulatory Visit: Payer: Self-pay | Admitting: Family Medicine

## 2019-10-16 MED ORDER — ATORVASTATIN CALCIUM 10 MG PO TABS
10.0000 mg | ORAL_TABLET | Freq: Every day | ORAL | 5 refills | Status: DC
Start: 2019-10-16 — End: 2020-03-26

## 2019-10-16 NOTE — Progress Notes (Signed)
liptor 10

## 2019-10-17 LAB — HEPATIC FUNCTION PANEL (6)
ALT: 39 IU/L — ABNORMAL HIGH (ref 0–32)
AST: 60 IU/L — ABNORMAL HIGH (ref 0–40)
Albumin: 4.7 g/dL (ref 3.8–4.8)
Alkaline Phosphatase: 119 IU/L (ref 48–121)
Bilirubin Total: 0.2 mg/dL (ref 0.0–1.2)
Bilirubin, Direct: 0.09 mg/dL (ref 0.00–0.40)

## 2019-10-17 LAB — SPECIMEN STATUS REPORT

## 2019-10-17 LAB — HEPATIC FUNCTION PANEL
ALT: 21 IU/L (ref 0–32)
AST: 21 IU/L (ref 0–40)
Albumin: 4.4 g/dL (ref 3.8–4.8)
Alkaline Phosphatase: 100 IU/L (ref 44–121)
Bilirubin Total: 0.5 mg/dL (ref 0.0–1.2)
Bilirubin, Direct: 0.13 mg/dL (ref 0.00–0.40)
Total Protein: 7 g/dL (ref 6.0–8.5)

## 2019-11-04 ENCOUNTER — Encounter: Payer: Self-pay | Admitting: Emergency Medicine

## 2019-11-04 ENCOUNTER — Other Ambulatory Visit: Payer: Self-pay

## 2019-11-04 ENCOUNTER — Ambulatory Visit: Payer: Medicare HMO | Admitting: Emergency Medicine

## 2019-11-04 VITALS — BP 124/78 | HR 101 | Temp 97.4°F | Ht 68.0 in | Wt 139.6 lb

## 2019-11-04 DIAGNOSIS — R7401 Elevation of levels of liver transaminase levels: Secondary | ICD-10-CM

## 2019-11-04 DIAGNOSIS — R911 Solitary pulmonary nodule: Secondary | ICD-10-CM

## 2019-11-04 LAB — COMPREHENSIVE METABOLIC PANEL
ALT: 23 U/L (ref 0–35)
AST: 30 U/L (ref 0–37)
Albumin: 4.5 g/dL (ref 3.5–5.2)
Alkaline Phosphatase: 82 U/L (ref 39–117)
BUN: 9 mg/dL (ref 6–23)
CO2: 31 mEq/L (ref 19–32)
Calcium: 9 mg/dL (ref 8.4–10.5)
Chloride: 100 mEq/L (ref 96–112)
Creatinine, Ser: 0.83 mg/dL (ref 0.40–1.20)
GFR: 74.36 mL/min (ref >60.00)
Glucose, Bld: 86 mg/dL (ref 70–99)
Potassium: 3.7 mEq/L (ref 3.5–5.1)
Sodium: 140 mEq/L (ref 135–145)
Total Bilirubin: 0.5 mg/dL (ref 0.2–1.2)
Total Protein: 7.4 g/dL (ref 6.0–8.3)

## 2019-11-04 LAB — CBC WITH DIFFERENTIAL/PLATELET
Basophils Absolute: 0 10*3/uL (ref 0.0–0.1)
Basophils Relative: 0.5 % (ref 0.0–3.0)
Eosinophils Absolute: 0.1 10*3/uL (ref 0.0–0.7)
Eosinophils Relative: 1.5 % (ref 0.0–5.0)
HCT: 36.6 % (ref 36.0–46.0)
Hemoglobin: 12.3 g/dL (ref 12.0–15.0)
Lymphocytes Relative: 47.1 % — ABNORMAL HIGH (ref 12.0–46.0)
Lymphs Abs: 3.7 10*3/uL (ref 0.7–4.0)
MCHC: 33.7 g/dL (ref 30.0–36.0)
MCV: 78.9 fl (ref 78.0–100.0)
Monocytes Absolute: 0.6 10*3/uL (ref 0.1–1.0)
Monocytes Relative: 7.6 % (ref 3.0–12.0)
Neutro Abs: 3.4 10*3/uL (ref 1.4–7.7)
Neutrophils Relative %: 43.3 % (ref 43.0–77.0)
Platelets: 294 10*3/uL (ref 150.0–400.0)
RBC: 4.64 Mil/uL (ref 3.87–5.11)
RDW: 14.7 % (ref 11.5–15.5)
WBC: 7.8 10*3/uL (ref 4.0–10.5)

## 2019-11-04 LAB — PROTIME-INR
INR: 1.1 ratio — ABNORMAL HIGH (ref 0.8–1.0)
Prothrombin Time: 12.1 s (ref 9.6–13.1)

## 2019-11-04 NOTE — Patient Instructions (Signed)
We will work on arranging navigational bronchoscopy to evaluate your left lower lobe pulmonary nodule.  Hopefully we can do this in the first week of November. You will be called with details. Lab work today Follow with Dr Lamonte Sakai in 1 month

## 2019-11-04 NOTE — Addendum Note (Signed)
Addended by: Gavin Potters R on: 11/04/2019 03:04 PM   Modules accepted: Orders

## 2019-11-04 NOTE — H&P (View-Only) (Signed)
   Subjective:    Patient ID: Kelly Douglas, female    DOB: 1955/07/18, 64 y.o.   MRN: [id number]  HPI  ROV 08/07/2018 --[name] is 64, has a history of tobacco use with mild COPD, scleroderma, coronary disease.  I have followed her for pulmonary nodular disease and abnormal CT scan of the chest.  She has left lower lobe nodular disease, low-level hypermetabolism on PET scan.  Navigational bronchoscopy October 2019 was negative for malignancy.  Most recent CT done 08/01/2018, reviewed by me shows that her nodules are stable in appearance and size, the largest is a semisolid 2.1 x 1.3 cm left lower lobe nodule. Stability to 11/01/16.   ROV 11/04/19 --follow-up visit 64 year old woman with history of mild COPD, active tobacco.  She has scleroderma, coronary disease.  Also pulmonary nodular disease that we have changed with tracked with serial imaging.  She underwent a navigational bronchoscopy 10/2017 that was negative for malignancy.  Her most recent CT chest was done 08/05/2019 and I have reviewed.  This shows continued gradual increase in her left lower lobe part solid nodule now gaining some solid component.  PFT 09/05/17 reviewed by me, show mild obstruction without a bronchodilator response, FEV1 2.34 L, 97% predicted.  There was a significant diffusion defect. Good exertional tolerance. No albuterol use.   Review of Systems  HENT: Negative for dental problem and nosebleeds.   Respiratory: Positive for shortness of breath.   Allergic/Immunologic: Negative.   Psychiatric/Behavioral: The patient is nervous/anxious.         Objective:   Physical Exam Vitals:   11/04/19 1415  BP: 124/78  Pulse: (!) 101  Temp: (!) 97.4 F (36.3 C)  TempSrc: Temporal  SpO2: 99%  Weight: 139 lb 9.6 oz (63.3 kg)  Height: 5\' 8"  (1.727 m)   Gen: Pleasant, well-nourished, in no distress,  normal affect  ENT: No lesions,  mouth clear,  oropharynx clear, no postnasal drip  Neck: No JVD, no  stridor  Lungs: No use of accessory muscles, clear without rales or rhonchi  Cardiovascular: RRR, heart sounds normal, no murmur or gallops, no peripheral edema  Musculoskeletal: No deformities, no cyanosis or clubbing  Neuro: alert, non focal  Skin: Some sclerodactyly and hyperpigmentation changes on her bilateral hands      Assessment & Plan:  Nodule of lower lobe of left lung Has continued to enlarge over the last year.  We talked about the options including possible primary resection.  Given her comorbidities we have decided that navigational bronchoscopy with biopsies makes the most sense at least for the initial work-up.  I will try to arrange for the first week of November.  Baltazar Apo, MD, PhD 11/04/2019, 2:52 PM [facility] [phone] or if no answer [phone]

## 2019-11-04 NOTE — Assessment & Plan Note (Signed)
Has continued to enlarge over the last year.  We talked about the options including possible primary resection.  Given her comorbidities we have decided that navigational bronchoscopy with biopsies makes the most sense at least for the initial work-up.  I will try to arrange for the first week of November.

## 2019-11-04 NOTE — Progress Notes (Signed)
   Subjective:    Patient ID: Kelly Douglas, female    DOB: 08-24-1955, 64 y.o.   MRN: [id number]  HPI  ROV 08/07/2018 --Kelly Douglas is 64, has a history of tobacco use with mild COPD, scleroderma, coronary disease.  I have followed her for pulmonary nodular disease and abnormal CT scan of the chest.  She has left lower lobe nodular disease, low-level hypermetabolism on PET scan.  Navigational bronchoscopy October 2019 was negative for malignancy.  Most recent CT done 08/01/2018, reviewed by me shows that her nodules are stable in appearance and size, the largest is a semisolid 2.1 x 1.3 cm left lower lobe nodule. Stability to 11/01/16.   ROV 11/04/19 --follow-up visit 64 year old woman with history of mild COPD, active tobacco.  She has scleroderma, coronary disease.  Also pulmonary nodular disease that we have changed with tracked with serial imaging.  She underwent a navigational bronchoscopy 10/2017 that was negative for malignancy.  Her most recent CT chest was done 08/05/2019 and I have reviewed.  This shows continued gradual increase in her left lower lobe part solid nodule now gaining some solid component.  PFT 09/05/17 reviewed by me, show mild obstruction without a bronchodilator response, FEV1 2.34 L, 97% predicted.  There was a significant diffusion defect. Good exertional tolerance. No albuterol use.   Review of Systems  HENT: Negative for dental problem and nosebleeds.   Respiratory: Positive for shortness of breath.   Allergic/Immunologic: Negative.   Psychiatric/Behavioral: The patient is nervous/anxious.         Objective:   Physical Exam Vitals:   11/04/19 1415  BP: 124/78  Pulse: (!) 101  Temp: (!) 97.4 F (36.3 C)  TempSrc: Temporal  SpO2: 99%  Weight: 139 lb 9.6 oz (63.3 kg)  Height: 5\' 8"  (1.727 m)   Gen: Pleasant, well-nourished, in no distress,  normal affect  ENT: No lesions,  mouth clear,  oropharynx clear, no postnasal drip  Neck: No JVD, no  stridor  Lungs: No use of accessory muscles, clear without rales or rhonchi  Cardiovascular: RRR, heart sounds normal, no murmur or gallops, no peripheral edema  Musculoskeletal: No deformities, no cyanosis or clubbing  Neuro: alert, non focal  Skin: Some sclerodactyly and hyperpigmentation changes on her bilateral hands      Assessment & Plan:  Nodule of lower lobe of left lung Has continued to enlarge over the last year.  We talked about the options including possible primary resection.  Given her comorbidities we have decided that navigational bronchoscopy with biopsies makes the most sense at least for the initial work-up.  I will try to arrange for the first week of November.  Baltazar Apo, MD, PhD 11/04/2019, 2:52 PM [facility] [phone] or if no answer [phone]

## 2019-11-04 NOTE — Addendum Note (Signed)
Addended by: Suzzanne Cloud E on: 11/04/2019 03:04 PM   Modules accepted: Orders

## 2019-11-06 NOTE — Patient Instructions (Signed)
Lilybelle Mayeda Gavin  11/06/2019     @PREFPERIOPPHARMACY @   Your procedure is scheduled on  11/11/2019  Report to [facility] at  1245  P.M.  Call this number if you have problems the morning of surgery:  [phone]   Remember:  Follow the diet and prep instructions given to you by the office.                      Take these medicines the morning of surgery with A SIP OF WATER  Amlodipine, cymbalta, levothyroxine, mobic(if needed), singulair, protonix. Use your inhaler before you come and bring your rescue inhaler with you.    Do not wear jewelry, make-up or nail polish.  Do not wear lotions, powders, or perfumes. Please wear deodorant and brush your teeth.  Do not shave 48 hours prior to surgery.  Men may shave face and neck.  Do not bring valuables to the hospital.  [facility] is not responsible for any belongings or valuables.  Contacts, dentures or bridgework may not be worn into surgery.  Leave your suitcase in the car.  After surgery it may be brought to your room.  For patients admitted to the hospital, discharge time will be determined by your treatment team.  Patients discharged the day of surgery will not be allowed to drive home.   Name and phone number of your driver:   Family  Special instructions:   DO NOT smoke the morning of your procedure.  Please read over the following fact sheets that you were given. Anesthesia Post-op Instructions and Care and Recovery After Surgery       Colonoscopy, Adult, Care After This sheet gives you information about how to care for yourself after your procedure. Your health care provider may also give you more specific instructions. If you have problems or questions, contact your health care provider. What can I expect after the procedure? After the procedure, it is common to have:  A small amount of blood in your stool for 24 hours after the procedure.  Some gas.  Mild cramping or bloating of your abdomen. Follow  these instructions at home: Eating and drinking   Drink enough fluid to keep your urine pale yellow.  Follow instructions from your health care provider about eating or drinking restrictions.  Resume your normal diet as instructed by your health care provider. Avoid heavy or fried foods that are hard to digest. Activity  Rest as told by your health care provider.  Avoid sitting for a long time without moving. Get up to take short walks every 1-2 hours. This is important to improve blood flow and breathing. Ask for help if you feel weak or unsteady.  Return to your normal activities as told by your health care provider. Ask your health care provider what activities are safe for you. Managing cramping and bloating   Try walking around when you have cramps or feel bloated.  Apply heat to your abdomen as told by your health care provider. Use the heat source that your health care provider recommends, such as a moist heat pack or a heating pad. ? Place a towel between your skin and the heat source. ? Leave the heat on for 20-30 minutes. ? Remove the heat if your skin turns bright red. This is especially important if you are unable to feel pain, heat, or cold. You may have a greater risk of getting burned. General instructions  For the first 24 hours after the procedure: ? Do not drive or use machinery. ? Do not sign important documents. ? Do not drink alcohol. ? Do your regular daily activities at a slower pace than normal. ? Eat soft foods that are easy to digest.  Take over-the-counter and prescription medicines only as told by your health care provider.  Keep all follow-up visits as told by your health care provider. This is important. Contact a health care provider if:  You have blood in your stool 2-3 days after the procedure. Get help right away if you have:  More than a small spotting of blood in your stool.  Large blood clots in your stool.  Swelling of your  abdomen.  Nausea or vomiting.  A fever.  Increasing pain in your abdomen that is not relieved with medicine. Summary  After the procedure, it is common to have a small amount of blood in your stool. You may also have mild cramping and bloating of your abdomen.  For the first 24 hours after the procedure, do not drive or use machinery, sign important documents, or drink alcohol.  Get help right away if you have a lot of blood in your stool, nausea or vomiting, a fever, or increased pain in your abdomen. This information is not intended to replace advice given to you by your health care provider. Make sure you discuss any questions you have with your health care provider. Document Revised: 07/30/2018 Document Reviewed: 07/30/2018 Elsevier Patient Education  [place] After These instructions provide you with information about caring for yourself after your procedure. Your health care provider may also give you more specific instructions. Your treatment has been planned according to current medical practices, but problems sometimes occur. Call your health care provider if you have any problems or questions after your procedure. What can I expect after the procedure? After your procedure, you may:  Feel sleepy for several hours.  Feel clumsy and have poor balance for several hours.  Feel forgetful about what happened after the procedure.  Have poor judgment for several hours.  Feel nauseous or vomit.  Have a sore throat if you had a breathing tube during the procedure. Follow these instructions at home: For at least 24 hours after the procedure:      Have a responsible adult stay with you. It is important to have someone help care for you until you are awake and alert.  Rest as needed.  Do not: ? Participate in activities in which you could fall or become injured. ? Drive. ? Use heavy machinery. ? Drink alcohol. ? Take sleeping  pills or medicines that cause drowsiness. ? Make important decisions or sign legal documents. ? Take care of children on your own. Eating and drinking  Follow the diet that is recommended by your health care provider.  If you vomit, drink water, juice, or soup when you can drink without vomiting.  Make sure you have little or no nausea before eating solid foods. General instructions  Take over-the-counter and prescription medicines only as told by your health care provider.  If you have sleep apnea, surgery and certain medicines can increase your risk for breathing problems. Follow instructions from your health care provider about wearing your sleep device: ? Anytime you are sleeping, including during daytime naps. ? While taking prescription pain medicines, sleeping medicines, or medicines that make you drowsy.  If you smoke, do not smoke without supervision.  Keep  all follow-up visits as told by your health care provider. This is important. Contact a health care provider if:  You keep feeling nauseous or you keep vomiting.  You feel light-headed.  You develop a rash.  You have a fever. Get help right away if:  You have trouble breathing. Summary  For several hours after your procedure, you may feel sleepy and have poor judgment.  Have a responsible adult stay with you for at least 24 hours or until you are awake and alert. This information is not intended to replace advice given to you by your health care provider. Make sure you discuss any questions you have with your health care provider. Document Revised: 04/03/2017 Document Reviewed: 04/26/2015 Elsevier Patient Education  [place].

## 2019-11-07 ENCOUNTER — Other Ambulatory Visit (HOSPITAL_COMMUNITY)
Admission: RE | Admit: 2019-11-07 | Discharge: 2019-11-07 | Disposition: A | Discharge status: Home / Self Care | Payer: Medicare HMO | Source: Ambulatory Visit | Attending: Internal Medicine | Admitting: Internal Medicine

## 2019-11-07 ENCOUNTER — Encounter (HOSPITAL_COMMUNITY): Admission: RE | Admit: 2019-11-07 | Payer: Medicare HMO | Source: Ambulatory Visit

## 2019-11-07 ENCOUNTER — Other Ambulatory Visit: Payer: Self-pay

## 2019-11-07 ENCOUNTER — Encounter (HOSPITAL_COMMUNITY)
Admission: RE | Admit: 2019-11-07 | Discharge: 2019-11-07 | Disposition: A | Discharge status: Home / Self Care | Payer: Medicare HMO | Source: Ambulatory Visit | Attending: Internal Medicine | Admitting: Internal Medicine

## 2019-11-07 DIAGNOSIS — Z01810 Encounter for preprocedural cardiovascular examination: Secondary | ICD-10-CM | POA: Diagnosis not present

## 2019-11-07 DIAGNOSIS — Z01818 Encounter for other preprocedural examination: Secondary | ICD-10-CM | POA: Insufficient documentation

## 2019-11-07 DIAGNOSIS — Z20822 Contact with and (suspected) exposure to covid-19: Secondary | ICD-10-CM | POA: Insufficient documentation

## 2019-11-08 LAB — SARS CORONAVIRUS 2 (TAT 6-24 HRS): SARS Coronavirus 2: NEGATIVE

## 2019-11-11 ENCOUNTER — Ambulatory Visit (HOSPITAL_COMMUNITY): Payer: Medicare HMO | Admitting: Anesthesiology

## 2019-11-11 ENCOUNTER — Other Ambulatory Visit: Payer: Self-pay

## 2019-11-11 ENCOUNTER — Ambulatory Visit (HOSPITAL_COMMUNITY)
Admission: RE | Admit: 2019-11-11 | Discharge: 2019-11-11 | Disposition: A | Discharge status: Home / Self Care | Payer: Medicare HMO | Attending: Internal Medicine | Admitting: Internal Medicine

## 2019-11-11 ENCOUNTER — Encounter (HOSPITAL_COMMUNITY): Payer: Self-pay | Admitting: Internal Medicine

## 2019-11-11 ENCOUNTER — Encounter (HOSPITAL_COMMUNITY)
Admission: RE | Disposition: A | Discharge status: Home / Self Care | Payer: Self-pay | Source: Home / Self Care | Attending: Internal Medicine

## 2019-11-11 DIAGNOSIS — Z833 Family history of diabetes mellitus: Secondary | ICD-10-CM | POA: Insufficient documentation

## 2019-11-11 DIAGNOSIS — R55 Syncope and collapse: Secondary | ICD-10-CM | POA: Insufficient documentation

## 2019-11-11 DIAGNOSIS — K219 Gastro-esophageal reflux disease without esophagitis: Secondary | ICD-10-CM | POA: Diagnosis not present

## 2019-11-11 DIAGNOSIS — Z86010 Personal history of colonic polyps: Secondary | ICD-10-CM | POA: Insufficient documentation

## 2019-11-11 DIAGNOSIS — Z1211 Encounter for screening for malignant neoplasm of colon: Secondary | ICD-10-CM | POA: Diagnosis not present

## 2019-11-11 DIAGNOSIS — K5730 Diverticulosis of large intestine without perforation or abscess without bleeding: Secondary | ICD-10-CM | POA: Insufficient documentation

## 2019-11-11 DIAGNOSIS — F17200 Nicotine dependence, unspecified, uncomplicated: Secondary | ICD-10-CM | POA: Diagnosis not present

## 2019-11-11 DIAGNOSIS — J449 Chronic obstructive pulmonary disease, unspecified: Secondary | ICD-10-CM | POA: Insufficient documentation

## 2019-11-11 DIAGNOSIS — I2510 Atherosclerotic heart disease of native coronary artery without angina pectoris: Secondary | ICD-10-CM | POA: Insufficient documentation

## 2019-11-11 DIAGNOSIS — E039 Hypothyroidism, unspecified: Secondary | ICD-10-CM | POA: Insufficient documentation

## 2019-11-11 DIAGNOSIS — R0602 Shortness of breath: Secondary | ICD-10-CM | POA: Insufficient documentation

## 2019-11-11 DIAGNOSIS — Z823 Family history of stroke: Secondary | ICD-10-CM | POA: Diagnosis not present

## 2019-11-11 DIAGNOSIS — I10 Essential (primary) hypertension: Secondary | ICD-10-CM | POA: Insufficient documentation

## 2019-11-11 DIAGNOSIS — Z79899 Other long term (current) drug therapy: Secondary | ICD-10-CM | POA: Insufficient documentation

## 2019-11-11 DIAGNOSIS — Z791 Long term (current) use of non-steroidal anti-inflammatories (NSAID): Secondary | ICD-10-CM | POA: Diagnosis not present

## 2019-11-11 DIAGNOSIS — K621 Rectal polyp: Secondary | ICD-10-CM

## 2019-11-11 DIAGNOSIS — F32A Depression, unspecified: Secondary | ICD-10-CM | POA: Diagnosis not present

## 2019-11-11 DIAGNOSIS — Z800 Family history of malignant neoplasm of digestive organs: Secondary | ICD-10-CM | POA: Insufficient documentation

## 2019-11-11 DIAGNOSIS — F17210 Nicotine dependence, cigarettes, uncomplicated: Secondary | ICD-10-CM | POA: Diagnosis not present

## 2019-11-11 DIAGNOSIS — M1990 Unspecified osteoarthritis, unspecified site: Secondary | ICD-10-CM | POA: Insufficient documentation

## 2019-11-11 DIAGNOSIS — F419 Anxiety disorder, unspecified: Secondary | ICD-10-CM | POA: Diagnosis not present

## 2019-11-11 DIAGNOSIS — Z8249 Family history of ischemic heart disease and other diseases of the circulatory system: Secondary | ICD-10-CM | POA: Insufficient documentation

## 2019-11-11 DIAGNOSIS — Z7982 Long term (current) use of aspirin: Secondary | ICD-10-CM | POA: Insufficient documentation

## 2019-11-11 DIAGNOSIS — D649 Anemia, unspecified: Secondary | ICD-10-CM | POA: Insufficient documentation

## 2019-11-11 DIAGNOSIS — E785 Hyperlipidemia, unspecified: Secondary | ICD-10-CM | POA: Insufficient documentation

## 2019-11-11 DIAGNOSIS — D128 Benign neoplasm of rectum: Secondary | ICD-10-CM | POA: Diagnosis not present

## 2019-11-11 DIAGNOSIS — M349 Systemic sclerosis, unspecified: Secondary | ICD-10-CM | POA: Insufficient documentation

## 2019-11-11 DIAGNOSIS — Z810 Family history of intellectual disabilities: Secondary | ICD-10-CM | POA: Insufficient documentation

## 2019-11-11 DIAGNOSIS — Z09 Encounter for follow-up examination after completed treatment for conditions other than malignant neoplasm: Secondary | ICD-10-CM | POA: Diagnosis not present

## 2019-11-11 HISTORY — PX: POLYPECTOMY: SHX5525

## 2019-11-11 HISTORY — PX: COLONOSCOPY WITH PROPOFOL: SHX5780

## 2019-11-11 SURGERY — COLONOSCOPY WITH PROPOFOL
Anesthesia: General

## 2019-11-11 MED ORDER — PROPOFOL 500 MG/50ML IV EMUL
INTRAVENOUS | Status: DC | PRN
  Administered 2019-11-11: 150 ug/kg/min via INTRAVENOUS

## 2019-11-11 MED ORDER — CHLORHEXIDINE GLUCONATE CLOTH 2 % EX PADS: 6.0000 | MEDICATED_PAD | Freq: Once | CUTANEOUS | Status: DC

## 2019-11-11 MED ORDER — LACTATED RINGERS IV SOLN: INTRAVENOUS | Status: DC | PRN

## 2019-11-11 MED ORDER — LACTATED RINGERS IV SOLN: Freq: Once | INTRAVENOUS | Status: AC

## 2019-11-11 MED ORDER — STERILE WATER FOR IRRIGATION IR SOLN
Status: DC | PRN
  Administered 2019-11-11: 1.5 mL

## 2019-11-11 MED ORDER — PROPOFOL 10 MG/ML IV BOLUS
INTRAVENOUS | Status: DC | PRN
  Administered 2019-11-11: 30 mg via INTRAVENOUS
  Administered 2019-11-11: 50 mg via INTRAVENOUS
  Administered 2019-11-11: 20 mg via INTRAVENOUS

## 2019-11-11 NOTE — Anesthesia Preprocedure Evaluation (Addendum)
Anesthesia Evaluation  Patient identified by MRN, date of birth, ID band Patient awake    Reviewed: Allergy & Precautions, NPO status , Patient's Chart, lab work & pertinent test results  History of Anesthesia Complications Negative for: history of anesthetic complications  Airway Mallampati: II  TM Distance: >3 FB Neck ROM: Full    Dental  (+) Edentulous Upper, Edentulous Lower   Pulmonary shortness of breath, COPD, Current Smoker,    Pulmonary exam normal breath sounds clear to auscultation       Cardiovascular Exercise Tolerance: Good hypertension, Pt. 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  Scleroderma  Cervical lymphadenopathy   Reproductive/Obstetrics                          Anesthesia Physical Anesthesia Plan  ASA: III  Anesthesia Plan: General   Post-op Pain Management:    Induction: Intravenous  PONV Risk Score and Plan: TIVA  Airway Management Planned: Nasal Cannula and Natural Airway  Additional Equipment:   Intra-op Plan:   Post-operative Plan:   Informed Consent: I have reviewed the patients History and Physical, chart, labs and discussed the procedure including the risks, benefits and alternatives for the proposed anesthesia with the patient or authorized representative who has indicated his/her understanding and acceptance.     Dental advisory given  Plan Discussed with: CRNA and Surgeon  Anesthesia Plan Comments:         Anesthesia Quick  Evaluation

## 2019-11-11 NOTE — Transfer of Care (Signed)
Immediate Anesthesia Transfer of Care Note  Patient: Kelly Douglas  Procedure(s) Performed: COLONOSCOPY WITH PROPOFOL (N/A ) POLYPECTOMY  Patient Location: PACU  Anesthesia Type:General  Level of Consciousness: awake  Airway & Oxygen Therapy: Patient Spontanous Breathing  Post-op Assessment: Report given to RN  Post vital signs: Reviewed and stable  Last Vitals:  Vitals Value Taken Time  BP 114/75 11/11/19 1516  Temp    Pulse    Resp 18 11/11/19 1518  SpO2    Vitals shown include unvalidated device data.  Last Pain:  Vitals:   11/11/19 1452  TempSrc:   PainSc: 0-No pain         Complications: No complications documented.

## 2019-11-11 NOTE — Discharge Instructions (Signed)
Colonoscopy Discharge Instructions  Read the instructions outlined below and refer to this sheet in the next few weeks. These discharge instructions provide you with general information on caring for yourself after you leave the hospital. Your doctor may also give you specific instructions. While your treatment has been planned according to the most current medical practices available, unavoidable complications occasionally occur. If you have any problems or questions after discharge, call Dr. Gala Romney at [phone]. ACTIVITY  You may resume your regular activity, but move at a slower pace for the next 24 hours.   Take frequent rest periods for the next 24 hours.   Walking will help get rid of the air and reduce the bloated feeling in your belly (abdomen).   No driving for 24 hours (because of the medicine (anesthesia) used during the test).    Do not sign any important legal documents or operate any machinery for 24 hours (because of the anesthesia used during the test).  NUTRITION  Drink plenty of fluids.   You may resume your normal diet as instructed by your doctor.   Begin with a light meal and progress to your normal diet. Heavy or fried foods are harder to digest and may make you feel sick to your stomach (nauseated).   Avoid alcoholic beverages for 24 hours or as instructed.  MEDICATIONS  You may resume your normal medications unless your doctor tells you otherwise.  WHAT YOU CAN EXPECT TODAY  Some feelings of bloating in the abdomen.   Passage of more gas than usual.   Spotting of blood in your stool or on the toilet paper.  IF YOU HAD POLYPS REMOVED DURING THE COLONOSCOPY:  No aspirin products for 7 days or as instructed.   No alcohol for 7 days or as instructed.   Eat a soft diet for the next 24 hours.  FINDING OUT THE RESULTS OF YOUR TEST Not all test results are available during your visit. If your test results are not back during the visit, make an appointment with  your caregiver to find out the results. Do not assume everything is normal if you have not heard from your caregiver or the medical facility. It is important for you to follow up on all of your test results.  SEEK IMMEDIATE MEDICAL ATTENTION IF:  You have more than a spotting of blood in your stool.   Your belly is swollen (abdominal distention).   You are nauseated or vomiting.   You have a temperature over 101.   You have abdominal pain or discomfort that is severe or gets worse throughout the day.   1 small polyp removed in your colon today  Diverticulosis and colon polyp information provided  Further recommendations to follow once I get the pathology report back to review   Diverticulitis  Diverticulitis is when small pockets in your large intestine (colon) get infected or swollen. This causes stomach pain and watery poop (diarrhea). These pouches are called diverticula. They form in people who have a condition called diverticulosis. Follow these instructions at home: Medicines  Take over-the-counter and prescription medicines only as told by your doctor. These include: ? Antibiotics. ? Pain medicines. ? Fiber pills. ? Probiotics. ? Stool softeners.  Do not drive or use heavy machinery while taking prescription pain medicine.  If you were prescribed an antibiotic, take it as told. Do not stop taking it even if you feel better. General instructions   Follow a diet as told by your doctor.  When  you feel better, your doctor may tell you to change your diet. You may need to eat a lot of fiber. Fiber makes it easier to poop (have bowel movements). Healthy foods with fiber include: ? Berries. ? Beans. ? Lentils. ? Green vegetables.  Exercise 3 or more times a week. Aim for 30 minutes each time. Exercise enough to sweat and make your heart beat faster.  Keep all follow-up visits as told. This is important. You may need to have an exam of the large intestine. This is  called a colonoscopy. Contact a doctor if:  Your pain does not get better.  You have a hard time eating or drinking.  You are not pooping like normal. Get help right away if:  Your pain gets worse.  Your problems do not get better.  Your problems get worse very fast.  You have a fever.  You throw up (vomit) more than one time.  You have poop that is: ? Bloody. ? Black. ? Tarry. Summary  Diverticulitis is when small pockets in your large intestine (colon) get infected or swollen.  Take medicines only as told by your doctor.  Follow a diet as told by your doctor. This information is not intended to replace advice given to you by your health care provider. Make sure you discuss any questions you have with your health care provider. Document Revised: 12/16/2016 Document Reviewed: 01/21/2016 Elsevier Patient Education  [place]. Colon Polyps  Polyps are tissue growths inside the body. Polyps can grow in many places, including the large intestine (colon). A polyp may be a round bump or a mushroom-shaped growth. You could have one polyp or several. Most colon polyps are noncancerous (benign). However, some colon polyps can become cancerous over time. Finding and removing the polyps early can help prevent this. What are the causes? The exact cause of colon polyps is not known. What increases the risk? You are more likely to develop this condition if you:  Have a family history of colon cancer or colon polyps.  Are older than 41 or older than 45 if you are African American.  Have inflammatory bowel disease, such as ulcerative colitis or Crohn's disease.  Have certain hereditary conditions, such as: ? Familial adenomatous polyposis. ? Lynch syndrome. ? Turcot syndrome. ? Peutz-Jeghers syndrome.  Are overweight.  Smoke cigarettes.  Do not get enough exercise.  Drink too much alcohol.  Eat a diet that is high in fat and red meat and low in fiber.  Had  childhood cancer that was treated with abdominal radiation. What are the signs or symptoms? Most polyps do not cause symptoms. If you have symptoms, they may include:  Blood coming from your rectum when having a bowel movement.  Blood in your stool. The stool may look dark red or black.  Abdominal pain.  A change in bowel habits, such as constipation or diarrhea. How is this diagnosed? This condition is diagnosed with a colonoscopy. This is a procedure in which a lighted, flexible scope is inserted into the anus and then passed into the colon to examine the area. Polyps are sometimes found when a colonoscopy is done as part of routine cancer screening tests. How is this treated? Treatment for this condition involves removing any polyps that are found. Most polyps can be removed during a colonoscopy. Those polyps will then be tested for cancer. Additional treatment may be needed depending on the results of testing. Follow these instructions at home: Lifestyle  Maintain a  healthy weight, or lose weight if recommended by your health care provider.  Exercise every day or as told by your health care provider.  Do not use any products that contain nicotine or tobacco, such as cigarettes and e-cigarettes. If you need help quitting, ask your health care provider.  If you drink alcohol, limit how much you have: ? 0-1 drink a day for women. ? 0-2 drinks a day for men.  Be aware of how much alcohol is in your drink. In the [place] one drink equals one 12 oz bottle of beer (355 mL), one 5 oz glass of wine (148 mL), or one 1 oz shot of hard liquor (44 mL). Eating and drinking   Eat foods that are high in fiber, such as fruits, vegetables, and whole grains.  Eat foods that are high in calcium and vitamin D, such as milk, cheese, yogurt, eggs, liver, fish, and broccoli.  Limit foods that are high in fat, such as fried foods and desserts.  Limit the amount of red meat and processed meat you  eat, such as hot dogs, sausage, bacon, and lunch meats. General instructions  Keep all follow-up visits as told by your health care provider. This is important. ? This includes having regularly scheduled colonoscopies. ? Talk to your health care provider about when you need a colonoscopy. Contact a health care provider if:  You have new or worsening bleeding during a bowel movement.  You have new or increased blood in your stool.  You have a change in bowel habits.  You lose weight for no known reason. Summary  Polyps are tissue growths inside the body. Polyps can grow in many places, including the colon.  Most colon polyps are noncancerous (benign), but some can become cancerous over time.  This condition is diagnosed with a colonoscopy.  Treatment for this condition involves removing any polyps that are found. Most polyps can be removed during a colonoscopy. This information is not intended to replace advice given to you by your health care provider. Make sure you discuss any questions you have with your health care provider. Document Revised: 04/20/2017 Document Reviewed: 04/20/2017 Elsevier Patient Education  [place].

## 2019-11-11 NOTE — Op Note (Signed)
[facility] Patient Name: Kelly Douglas Procedure Date: 11/11/2019 2:40 PM MRN: [id number] Date of Birth: Jan 31, 1955 Attending MD: Norvel Richards , MD CSN: [id number] Age: 64 Admit Type: Outpatient Procedure:                Colonoscopy Indications:              High risk colon cancer surveillance: Personal                            history of colonic polyps Providers:                Norvel Richards, MD, Charlsie Quest. Theda Sers RN, RN,                            Nelma Rothman, Technician Referring MD:              Medicines:                Propofol per Anesthesia Complications:            No immediate complications. Estimated Blood Loss:     Estimated blood loss was minimal. Procedure:                Pre-Anesthesia Assessment:                           - Prior to the procedure, a History and Physical                            was performed, and patient medications and                            allergies were reviewed. The patient's tolerance of                            previous anesthesia was also reviewed. The risks                            and benefits of the procedure and the sedation                            options and risks were discussed with the patient.                            All questions were answered, and informed consent                            was obtained. Prior Anticoagulants: The patient has                            taken no previous anticoagulant or antiplatelet                            agents. ASA Grade Assessment: II - A patient with  mild systemic disease. After reviewing the risks                            and benefits, the patient was deemed in                            satisfactory condition to undergo the procedure.                           After obtaining informed consent, the colonoscope                            was passed under direct vision. Throughout the                            procedure, the  patient's blood pressure, pulse, and                            oxygen saturations were monitored continuously. The                            CF-[id number] ([id number]) scope was introduced through                            the anus and advanced to the the cecum, identified                            by appendiceal orifice and ileocecal valve. The                            colonoscopy was performed without difficulty. The                            patient tolerated the procedure well. The quality                            of the bowel preparation was adequate. Scope In: 2:58:35 PM Scope Out: 3:09:18 PM Scope Withdrawal Time: 0 hours 7 minutes 37 seconds  Total Procedure Duration: 0 hours 10 minutes 43 seconds  Findings:      The perianal and digital rectal examinations were normal.      A 5 mm polyp was found in the rectum. The polyp was sessile. The polyp       was removed with a cold snare. Resection and retrieval were complete.       Estimated blood loss was minimal.      Scattered medium-mouthed diverticula were found in the sigmoid colon and       descending colon.      The exam was otherwise without abnormality on direct and retroflexion       views. Impression:               - One 5 mm polyp in the rectum, removed with a cold  snare. Resected and retrieved.                           - Diverticulosis in the sigmoid colon and in the                            descending colon.                           - The examination was otherwise normal on direct                            and retroflexion views. Moderate Sedation:      Moderate (conscious) sedation was personally administered by an       anesthesia professional. The following parameters were monitored: oxygen       saturation, heart rate, blood pressure, respiratory rate, EKG, adequacy       of pulmonary ventilation, and response to care. Recommendation:           - Patient has a contact number  available for                            emergencies. The signs and symptoms of potential                            delayed complications were discussed with the                            patient. Return to normal activities tomorrow.                            Written discharge instructions were provided to the                            patient.                           - Resume previous diet.                           - Continue present medications.                           - Repeat colonoscopy date to be determined after                            pending pathology results are reviewed for                            surveillance based on pathology results.                           - Return to GI office (date not yet determined). Procedure Code(s):        --- Professional ---  45385, Colonoscopy, flexible; with removal of                            tumor(s), polyp(s), or other lesion(s) by snare                            technique Diagnosis Code(s):        --- Professional ---                           Z86.010, Personal history of colonic polyps                           K62.1, Rectal polyp                           K57.30, Diverticulosis of large intestine without                            perforation or abscess without bleeding CPT copyright 2019 American Medical Association. All rights reserved. The codes documented in this report are preliminary and upon coder review may  be revised to meet current compliance requirements. [name]. Nikolai Wilczak, MD Norvel Richards, MD 11/11/2019 3:15:33 PM This report has been signed electronically. Number of Addenda: 0

## 2019-11-11 NOTE — Anesthesia Postprocedure Evaluation (Signed)
Anesthesia Post Note  Patient: Elide Stalzer Mitrano  Procedure(s) Performed: COLONOSCOPY WITH PROPOFOL (N/A ) POLYPECTOMY  Patient location during evaluation: PACU Anesthesia Type: General Level of consciousness: awake and alert Pain management: pain level controlled Vital Signs Assessment: post-procedure vital signs reviewed and stable Respiratory status: spontaneous breathing and nonlabored ventilation Cardiovascular status: blood pressure returned to baseline and stable Postop Assessment: no apparent nausea or vomiting Anesthetic complications: no   No complications documented.   Last Vitals:  Vitals:   11/11/19 1423 11/11/19 1518  BP: 126/87   Pulse: (!) 101   Resp: 20   Temp: 36.7 C (P) 36.4 C  SpO2: 100% (P) 100%    Last Pain:  Vitals:   11/11/19 1452  TempSrc:   PainSc: 0-No pain                 Jamarri Vuncannon

## 2019-11-11 NOTE — H&P (Signed)
@LOGO @   Primary Care Physician:  [name], MD Primary Gastroenterologist:  Dr. Gala Romney  Pre-Procedure History & Physical: HPI:  Kelly Douglas is a 64 y.o. female here for surveillance colonoscopy.  History of colonic adenomas removed previously. Patient has had some chronic diarrhea which she states is now resolved.  Past Medical History:  Diagnosis Date  . Anxiety   . Arteriosclerotic cardiovascular disease (ASCVD) 2013   coronary calcification-left main, LAD and CX; small pericardial effusion  . Arthritis   . Chronic back pain   . Chronic bronchitis   . Chronic lung disease    Chronic scarring and volume loss-left lung; characteristics of a chronic infectious process-possible MAI  . Depression   . Dyspnea   . GERD (gastroesophageal reflux disease)    Dr Gala Romney EGD 09/2009->esophagitis, sm HH, antral erosions, atonic esophagus  . Hyperlipidemia   . Hypertension   . Hypothyroid 1981 approx  . Scleroderma ([place])   . Seasonal allergies   . Syncope    Multiple spells over the past 40+ years, likely neurocardiogenic  . Tobacco abuse 06/24/2009    Past Surgical History:  Procedure Laterality Date  . BILATERAL SALPINGOOPHORECTOMY  2013    Dr. Elonda Husky; uterus remains in situ  . COLONOSCOPY  09/28/09   anal papilla otherwise normal  . COLONOSCOPY N/A 08/29/2012   PIR:JJOACZY polyp-removed as described above. tubular adenoma  . ESOPHAGOGASTRODUODENOSCOPY  10/07/09   Dr. Barrie Dunker of esophageal mucosa, diffusely ?esophagitis (bx benign), small HH/antral erosions, erythema bx benign. 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 ESOPHAGOGASTRODUODENOSCOPY (EGD) WITH ESOPHAGEAL DILATION N/A 03/07/2012   SAY:[id number], patent, tubular esophagus of uncertain significance-status post biopsy )unremarkable). Hiatal hernia  . FUDUCIAL PLACEMENT  10/18/2017   Procedure: PLACEMENT OF 3 FUDUCIAL INTO LEFT LOWER LOBE OF LUNG-TARGET 1;  Surgeon: Collene Gobble, MD;  Location: [facility];   Service: Thoracic;;  . LAPAROSCOPIC APPENDECTOMY  07/16/2011   Procedure: APPENDECTOMY LAPAROSCOPIC;  Surgeon: Donato Heinz, MD;  Location: [facility];  Service: General;  Laterality: N/A;  . TUBAL LIGATION    . VIDEO BRONCHOSCOPY  10/18/2017  . VIDEO BRONCHOSCOPY WITH ENDOBRONCHIAL NAVIGATION N/A 10/18/2017   Procedure: VIDEO BRONCHOSCOPY WITH ENDOBRONCHIAL NAVIGATION;  Surgeon: Collene Gobble, MD;  Location: [facility];  Service: Thoracic;  Laterality: N/A;    Prior to Admission medications   Medication Sig Start Date End Date Taking? Authorizing Provider  acetaminophen (TYLENOL) 650 MG CR tablet Take 1,300 mg by mouth every 8 (eight) hours as needed for pain.    Yes [provider]  amLODipine (NORVASC) 5 MG tablet Take 1 tablet (5 mg total) by mouth daily. 04/17/19  Yes [name], MD  atorvastatin (LIPITOR) 10 MG tablet Take 1 tablet (10 mg total) by mouth daily. 10/16/19  Yes [name], MD  Cholecalciferol (VITAMIN D) 125 MCG (5000 UT) CAPS Take 5,000 Units by mouth daily.    Yes [provider]  DULoxetine (CYMBALTA) 60 MG capsule TAKE 1 CAPSULE EVERY DAY Patient taking differently: Take 60 mg by mouth daily.  08/21/19  Yes [name], MD  levothyroxine (SYNTHROID) 75 MCG tablet TAKE 1 TABLET BY MOUTH  EVERY OTHER DAY ALTERNATING WITH 1/2 TABLET EVERY OTHER DAY Patient taking differently: Take 37.5-75 mcg by mouth See admin instructions. Take 75 mcg by mouth every other day before breakfast, alternating with 37.5 mcg 08/21/19  Yes [name], MD  Magnesium 250 MG TABS Take 250 mg by mouth daily.  Yes [provider]  meloxicam (MOBIC) 7.5 MG tablet TAKE 1 TABLET EVERY DAY Patient taking differently: Take 7.5 mg by mouth daily.  08/21/19  Yes [name], MD  montelukast (SINGULAIR) 10 MG tablet Take 1 tablet (10 mg total) by mouth at bedtime. 04/17/19  Yes [name], MD  pantoprazole (PROTONIX) 40 MG tablet TAKE 1  TABLET TWICE DAILY Patient taking differently: Take 40 mg by mouth 2 (two) times daily.  08/21/19  Yes [name], MD  potassium chloride (KLOR-CON 10) 10 MEQ tablet Take 1 tablet (10 mEq total) by mouth daily. 04/17/19  Yes [name], MD  PROAIR RESPICLICK [phone] Base) MCG/ACT AEPB Inhale 2 puffs into the lungs 4 (four) times daily as needed. 08/07/18  Yes Collene Gobble, MD  hydrOXYzine (ATARAX/VISTARIL) 25 MG tablet Take one tablet at bedtime for sleep by mouth 09/18/19   [name], MD  Lidocaine-Menthol (ICY HOT LIDOCAINE PLUS MENTHOL) 4-1 % PTCH Apply 1 patch topically daily as needed (pain).     [provider]  Magnesium 500 MG CAPS Take 1 capsule by mouth daily.     [provider]    Allergies as of 09/06/2019 - Review Complete 09/03/2019  Allergen Reaction Noted  . Aspirin Other (See Comments) 05/19/2015    Family History  Problem Relation Age of Onset  . Stroke Father   . Aneurysm Father   . Coronary artery disease Brother   . Hypertension Brother   . Down syndrome Brother   . Hypertension Brother   . Hypertension Brother        father/mother  . Aneurysm Mother   . Throat cancer Brother 49       throat cancer  . Hypertension Sister   . Diabetes Brother   . Diabetes Brother   . Anesthesia problems Neg Hx   . Hypotension Neg Hx   . Malignant hyperthermia Neg Hx   . Pseudochol deficiency Neg Hx   . Colon cancer Neg Hx     Social History   Socioeconomic History  . Marital status: Married    Spouse name: Not on file  . Number of children: 2  . Years of education: Not on file  . Highest education level: Not on file  Occupational History  . Occupation: disabled    Fish farm manager: NOT EMPLOYED  Tobacco Use  . Smoking status: Current Every Day Smoker    Packs/day: 0.50    Years: 45.00    Pack years: 22.50    Types: Cigarettes    Start date: 07/14/1971  . Smokeless tobacco: Never Used  . Tobacco comment: 0.5 PPD current  plans to reduce 11/04/19 [facility]  Vaping Use  . Vaping Use: Never used  Substance and Sexual Activity  . Alcohol use: Not Currently    Alcohol/week: 0.0 standard drinks    Comment: quit June 2012-used to drink 6 pack daily  . Drug use: No  . Sexual activity: Yes    Birth control/protection: Post-menopausal  Other Topics Concern  . Not on file  Social History Narrative  . Not on file   Social Determinants of Health   Financial Resource Strain:   . Difficulty of Paying Living Expenses: Not on file  Food Insecurity:   . Worried About Charity fundraiser in the Last Year: Not on file  . Ran Out of Food in the Last Year: Not on file  Transportation Needs:   . Lack of Transportation (Medical):  Not on file  . Lack of Transportation (Non-Medical): Not on file  Physical Activity:   . Days of Exercise per Week: Not on file  . Minutes of Exercise per Session: Not on file  Stress:   . Feeling of Stress : Not on file  Social Connections:   . Frequency of Communication with Friends and Family: Not on file  . Frequency of Social Gatherings with Friends and Family: Not on file  . Attends Religious Services: Not on file  . Active Member of Clubs or Organizations: Not on file  . Attends Archivist Meetings: Not on file  . Marital Status: Not on file  Intimate Partner Violence:   . Fear of Current or Ex-Partner: Not on file  . Emotionally Abused: Not on file  . Physically Abused: Not on file  . Sexually Abused: Not on file    Review of Systems: See HPI, otherwise negative ROS  Physical Exam: There were no vitals taken for this visit. Neck:  Supple; no masses or thyromegaly. No significant cervical adenopathy. Lungs:  Clear throughout to auscultation.   No wheezes, crackles, or rhonchi. No acute distress. Heart:  Regular rate and rhythm; no murmurs, clicks, rubs,  or gallops. Abdomen: Non-distended, normal bowel sounds.  Soft and nontender without appreciable mass or  hepatosplenomegaly.  Pulses:  Normal pulses noted. Extremities:  Without clubbing or edema.  Impression/Plan: 64 year old lady here for surveillance colonoscopy.  History of colonic adenomas removed previously. The risks, benefits, limitations, alternatives and imponderables have been reviewed with the patient. Questions have been answered. All parties are agreeable.     Notice: This dictation was prepared with Dragon dictation along with smaller phrase technology. Any transcriptional errors that result from this process are unintentional and may not be corrected upon review.

## 2019-11-13 ENCOUNTER — Encounter: Payer: Self-pay | Admitting: Internal Medicine

## 2019-11-13 ENCOUNTER — Telehealth: Payer: Self-pay | Admitting: Emergency Medicine

## 2019-11-13 DIAGNOSIS — Z01812 Encounter for preprocedural laboratory examination: Secondary | ICD-10-CM

## 2019-11-13 LAB — SURGICAL PATHOLOGY

## 2019-11-13 NOTE — Telephone Encounter (Signed)
Test at [facility] to be drawn that morning

## 2019-11-13 NOTE — Telephone Encounter (Signed)
I am actually not sure which order is needed. I will CC: our Endo staff to see if they know.  Thanks  Garner Nash, DO [facility] 11/13/2019 5:09 PM

## 2019-11-13 NOTE — Telephone Encounter (Signed)
Dr. Valeta Harms could you please help [place] with placing this order? Thank you

## 2019-11-14 ENCOUNTER — Encounter (HOSPITAL_COMMUNITY): Payer: Self-pay | Admitting: Internal Medicine

## 2019-11-14 ENCOUNTER — Other Ambulatory Visit: Payer: Self-pay

## 2019-11-14 ENCOUNTER — Telehealth (INDEPENDENT_AMBULATORY_CARE_PROVIDER_SITE_OTHER): Payer: Medicare HMO | Admitting: Family Medicine

## 2019-11-14 DIAGNOSIS — F17218 Nicotine dependence, cigarettes, with other nicotine-induced disorders: Secondary | ICD-10-CM | POA: Diagnosis not present

## 2019-11-14 DIAGNOSIS — M19019 Primary osteoarthritis, unspecified shoulder: Secondary | ICD-10-CM

## 2019-11-14 DIAGNOSIS — F5104 Psychophysiologic insomnia: Secondary | ICD-10-CM | POA: Diagnosis not present

## 2019-11-14 MED ORDER — MELOXICAM 7.5 MG PO TABS: 7.5000 mg | ORAL_TABLET | Freq: Every day | ORAL | 0 refills | Status: DC

## 2019-11-14 MED ORDER — MONTELUKAST SODIUM 10 MG PO TABS
10.0000 mg | ORAL_TABLET | Freq: Every day | ORAL | 1 refills | Status: DC
Start: 2019-11-14 — End: 2020-03-26

## 2019-11-14 MED ORDER — HYDROXYZINE PAMOATE 50 MG PO CAPS: ORAL_CAPSULE | ORAL | 2 refills | Status: DC

## 2019-11-14 NOTE — Progress Notes (Signed)
Virtual Visit via Telephone Note  I connected with [name] on 11/14/19 at  3:00 PM EDT by telephone and verified that I am speaking with the correct person using two identifiers.  Location: Patient: home Provider: office   I discussed the limitations, risks, security and privacy concerns of performing an evaluation and management service by telephone and the availability of in person appointments. I also discussed with the patient that there may be a patient responsible charge related to this service. The patient expressed understanding and agreed to proceed.   History of Present Illness:   f/u insomnia and nicotine dependence. States sleep is no better, still has problems with waking shortly after falling asleep Increased stress and anxiety due to abnormal chest scan and is scheduled for biopsy. Smoking more, sleeping less and anxious States she knows she has to stop smoking after the biopsy, trying to stop , finds it very hard Observations/Objective: Good communication with no confusion and intact memory. Alert and oriented x 3 No signs of respiratory distress during speech    Assessment and Plan:  Insomnia Uncontrolled Sleep hygiene reviewed and written information offered also. Prescription sent for  medication needed. Increase hydroxyzine dose and re evaluate  Nicotine dependence Asked:confirms currently smokes cigarettes 1 PPD Assess: Unwilling to set a quit date, but is cutting back Advise: needs to QUIT to reduce risk of cancer, cardio and cerebrovascular disease Assist: counseled for 5 minutes and literature provided Arrange: follow up in 2 to 4 months    Follow Up Instructions:    I discussed the assessment and treatment plan with the patient. The patient was provided an opportunity to ask questions and all were answered. The patient agreed with the plan and demonstrated an understanding of the instructions.   The patient was advised to call back or  seek an in-person evaluation if the symptoms worsen or if the condition fails to improve as anticipated.  I provided 10 minutes of non-face-to-face time during this encounter.   Tula Nakayama, MD

## 2019-11-14 NOTE — Patient Instructions (Addendum)
Annual physical exam with MD in office with pap in December  Please work on cutting back on cigarettes, you do know that you need to quit now up to 1 ppd, START AT 18/DAY AS OF TOMORROW  ALL THE BEST WITH BRONCHOSCOPY  NEW higher dose of hydroxyzine is prescribed for  sleep   Insomnia Insomnia is a sleep disorder that makes it difficult to fall asleep or stay asleep. Insomnia can cause fatigue, low energy, difficulty concentrating, mood swings, and poor performance at work or school. There are three different ways to classify insomnia:  Difficulty falling asleep.  Difficulty staying asleep.  Waking up too early in the morning. Any type of insomnia can be long-term (chronic) or short-term (acute). Both are common. Short-term insomnia usually lasts for three months or less. Chronic insomnia occurs at least three times a week for longer than three months. What are the causes? Insomnia may be caused by another condition, situation, or substance, such as:  Anxiety.  Certain medicines.  Gastroesophageal reflux disease (GERD) or other gastrointestinal conditions.  Asthma or other breathing conditions.  Restless legs syndrome, sleep apnea, or other sleep disorders.  Chronic pain.  Menopause.  Stroke.  Abuse of alcohol, tobacco, or illegal drugs.  Mental health conditions, such as depression.  Caffeine.  Neurological disorders, such as Alzheimer's disease.  An overactive thyroid (hyperthyroidism). Sometimes, the cause of insomnia may not be known. What increases the risk? Risk factors for insomnia include:  Gender. Women are affected more often than men.  Age. Insomnia is more common as you get older.  Stress.  Lack of exercise.  Irregular work schedule or working night shifts.  Traveling between different time zones.  Certain medical and mental health conditions. What are the signs or symptoms? If you have insomnia, the main symptom is having trouble falling  asleep or having trouble staying asleep. This may lead to other symptoms, such as:  Feeling fatigued or having low energy.  Feeling nervous about going to sleep.  Not feeling rested in the morning.  Having trouble concentrating.  Feeling irritable, anxious, or depressed. How is this diagnosed? This condition may be diagnosed based on:  Your symptoms and medical history. Your health care provider may ask about: ? Your sleep habits. ? Any medical conditions you have. ? Your mental health.  A physical exam. How is this treated? Treatment for insomnia depends on the cause. Treatment may focus on treating an underlying condition that is causing insomnia. Treatment may also include:  Medicines to help you sleep.  Counseling or therapy.  Lifestyle adjustments to help you sleep better. Follow these instructions at home: Eating and drinking   Limit or avoid alcohol, caffeinated beverages, and cigarettes, especially close to bedtime. These can disrupt your sleep.  Do not eat a large meal or eat spicy foods right before bedtime. This can lead to digestive discomfort that can make it hard for you to sleep. Sleep habits   Keep a sleep diary to help you and your health care provider figure out what could be causing your insomnia. Write down: ? When you sleep. ? When you wake up during the night. ? How well you sleep. ? How rested you feel the next day. ? Any side effects of medicines you are taking. ? What you eat and drink.  Make your bedroom a dark, comfortable place where it is easy to fall asleep. ? Put up shades or blackout curtains to block light from outside. ? Use a white  noise machine to block noise. ? Keep the temperature cool.  Limit screen use before bedtime. This includes: ? Watching TV. ? Using your smartphone, tablet, or computer.  Stick to a routine that includes going to bed and waking up at the same times every day and night. This can help you fall asleep  faster. Consider making a quiet activity, such as reading, part of your nighttime routine.  Try to avoid taking naps during the day so that you sleep better at night.  Get out of bed if you are still awake after 15 minutes of trying to sleep. Keep the lights down, but try reading or doing a quiet activity. When you feel sleepy, go back to bed. General instructions  Take over-the-counter and prescription medicines only as told by your health care provider.  Exercise regularly, as told by your health care provider. Avoid exercise starting several hours before bedtime.  Use relaxation techniques to manage stress. Ask your health care provider to suggest some techniques that may work well for you. These may include: ? Breathing exercises. ? Routines to release muscle tension. ? Visualizing peaceful scenes.  Make sure that you drive carefully. Avoid driving if you feel very sleepy.  Keep all follow-up visits as told by your health care provider. This is important. Contact a health care provider if:  You are tired throughout the day.  You have trouble in your daily routine due to sleepiness.  You continue to have sleep problems, or your sleep problems get worse. Get help right away if:  You have serious thoughts about hurting yourself or someone else. If you ever feel like you may hurt yourself or others, or have thoughts about taking your own life, get help right away. You can go to your nearest emergency department or call:  Your local emergency services (911 in the [place]  A suicide crisis helpline, such as the [facility] at (716)681-5040. This is open 24 hours a day. Summary  Insomnia is a sleep disorder that makes it difficult to fall asleep or stay asleep.  Insomnia can be long-term (chronic) or short-term (acute).  Treatment for insomnia depends on the cause. Treatment may focus on treating an underlying condition that is causing insomnia.  Keep a  sleep diary to help you and your health care provider figure out what could be causing your insomnia. This information is not intended to replace advice given to you by your health care provider. Make sure you discuss any questions you have with your health care provider. Document Revised: 12/16/2016 Document Reviewed: 10/13/2016 Elsevier Patient Education  2020 [employer].

## 2019-11-14 NOTE — Telephone Encounter (Signed)
Please have patient show up 3 hours prior to procedure and it can be done in pre-op  Garner Nash, DO [facility] 11/14/2019 1:18 PM

## 2019-11-15 ENCOUNTER — Encounter: Payer: Self-pay | Admitting: Family Medicine

## 2019-11-15 NOTE — Assessment & Plan Note (Signed)
Asked:confirms currently smokes cigarettes 1 PPD Assess: Unwilling to set a quit date, but is cutting back Advise: needs to QUIT to reduce risk of cancer, cardio and cerebrovascular disease Assist: counseled for 5 minutes and literature provided Arrange: follow up in 2 to 4 months

## 2019-11-15 NOTE — Telephone Encounter (Signed)
Spoke with the pt and notified that she needs to arrive 3 hours prior to procedure for testing  I placed an order but not sure if it is the correct one

## 2019-11-15 NOTE — Assessment & Plan Note (Signed)
Uncontrolled Sleep hygiene reviewed and written information offered also. Prescription sent for  medication needed. Increase hydroxyzine dose and re evaluate

## 2019-11-18 NOTE — Telephone Encounter (Signed)
Thank you :)

## 2019-11-20 ENCOUNTER — Telehealth (INDEPENDENT_AMBULATORY_CARE_PROVIDER_SITE_OTHER): Payer: Medicare HMO | Admitting: Family Medicine

## 2019-11-20 ENCOUNTER — Encounter: Payer: Self-pay | Admitting: Family Medicine

## 2019-11-20 ENCOUNTER — Other Ambulatory Visit: Payer: Self-pay

## 2019-11-20 ENCOUNTER — Encounter (HOSPITAL_COMMUNITY): Payer: Self-pay | Admitting: Emergency Medicine

## 2019-11-20 VITALS — Ht 68.0 in | Wt 139.0 lb

## 2019-11-20 DIAGNOSIS — Z0000 Encounter for general adult medical examination without abnormal findings: Secondary | ICD-10-CM

## 2019-11-20 NOTE — Progress Notes (Signed)
PCP - Dr Tula Nakayama Cardiologist - n/a Gertie Fey - Dr Manus Rudd Pulmonology - Dr Baltazar Apo  Chest x-ray - n/a CT Chest 08/05/19 EKG - 11/07/19 Stress Test - n/a ECHO - 09/05/17 Cardiac Cath - n/a  Anesthesia review: Yes  STOP now taking any Aspirin (unless otherwise instructed by your surgeon), Aleve, Naproxen, Ibuprofen, Motrin, Advil, Goody's, BC's, all herbal medications, fish oil, and all vitamins.   Coronavirus Screening Covid test on DOS 11/21/19 Do you have any of the following symptoms:  Cough yes/no: No Fever (>100.42F)  yes/no: No Runny nose yes/no: No Sore throat yes/no: No Difficulty breathing/shortness of breath  yes/no: No  Have you traveled in the last 14 days and where? yes/no: No  Patient verbalized understanding of instructions that were given via phone.

## 2019-11-20 NOTE — Anesthesia Preprocedure Evaluation (Addendum)
Anesthesia Evaluation  Patient identified by MRN, date of birth, ID band Patient awake    Reviewed: Allergy & Precautions, H&P , NPO status , Patient's Chart, lab work & pertinent test results  Airway Mallampati: I  TM Distance: >3 FB Neck ROM: Full    Dental no notable dental hx. (+) Edentulous Upper, Edentulous Lower, Dental Advisory Given   Pulmonary shortness of breath, Current SmokerPatient did not abstain from smoking.,    Pulmonary exam normal breath sounds clear to auscultation       Cardiovascular Exercise Tolerance: Good hypertension, Pt. on medications  Rhythm:Regular Rate:Normal     Neuro/Psych Anxiety Depression negative neurological ROS     GI/Hepatic Neg liver ROS, GERD  ,  Endo/Other  Hypothyroidism   Renal/GU negative Renal ROS  negative genitourinary   Musculoskeletal  (+) Arthritis ,   Abdominal   Peds  Hematology  (+) Blood dyscrasia, anemia ,   Anesthesia Other Findings   Reproductive/Obstetrics negative OB ROS                           Anesthesia Physical Anesthesia Plan  ASA: III  Anesthesia Plan: General   Post-op Pain Management:    Induction: Intravenous  PONV Risk Score and Plan: 2 and Ondansetron, Dexamethasone and Midazolam  Airway Management Planned: Oral ETT  Additional Equipment:   Intra-op Plan:   Post-operative Plan: Extubation in OR  Informed Consent: I have reviewed the patients History and Physical, chart, labs and discussed the procedure including the risks, benefits and alternatives for the proposed anesthesia with the patient or authorized representative who has indicated his/her understanding and acceptance.     Dental advisory given  Plan Discussed with: CRNA  Anesthesia Plan Comments: (PAT note written 11/20/2019 by Myra Gianotti, PA-C. )      Anesthesia Quick Evaluation

## 2019-11-20 NOTE — Progress Notes (Addendum)
Subjective:   Kelly Douglas is a 64 y.o. female who presents for Medicare Annual (Subsequent) preventive examination.  Method of visit: Telephone  Location of Patient: Home Location of Provider: Office Consent was obtain for visit over the telephone. Services rendered by provider: Visit was performed via telephone I verified that I am speaking with the correct person using two identifiers.   Review of Systems     Cardiac Risk Factors include: smoking/ tobacco exposure;hypertension     Objective:    Today's Vitals   11/20/19 1433 11/20/19 1434  Weight: 139 lb (63 kg)   Height: 5\' 8"  (1.727 m)   PainSc: 6  6   PainLoc: Hip    Body mass index is 21.13 kg/m.  Advanced Directives 11/20/2019 11/11/2019 11/07/2019 06/05/2019 03/29/2018 03/28/2018 10/18/2017  Does Patient Have a Medical Advance Directive? No No No No No No No  Does patient want to make changes to medical advance directive? - - - - - - -  Would patient like information on creating a medical advance directive? No - Patient declined No - Patient declined - No - Patient declined No - Patient declined - Yes (MAU/Ambulatory/Procedural Areas - Information given)  Pre-existing out of facility DNR order (yellow form or pink MOST form) - - - - - - -    Current Medications (verified) Outpatient Encounter Medications as of 11/20/2019  Medication Sig  . acetaminophen (TYLENOL) 650 MG CR tablet Take 1,300 mg by mouth every 8 (eight) hours as needed for pain.   Marland Kitchen amLODipine (NORVASC) 5 MG tablet Take 1 tablet (5 mg total) by mouth daily.  Marland Kitchen atorvastatin (LIPITOR) 10 MG tablet Take 1 tablet (10 mg total) by mouth daily.  . Cholecalciferol (VITAMIN D) 125 MCG (5000 UT) CAPS Take 5,000 Units by mouth daily.   . DULoxetine (CYMBALTA) 60 MG capsule TAKE 1 CAPSULE EVERY DAY (Patient taking differently: Take 60 mg by mouth daily. )  . hydrOXYzine (ATARAX/VISTARIL) 25 MG tablet Take one tablet at bedtime for sleep by mouth  .  hydrOXYzine (VISTARIL) 50 MG capsule Take one to two capsule by mouth at bedtime for sleep  . levothyroxine (SYNTHROID) 75 MCG tablet TAKE 1 TABLET BY MOUTH  EVERY OTHER DAY ALTERNATING WITH 1/2 TABLET EVERY OTHER DAY (Patient taking differently: Take 37.5-75 mcg by mouth See admin instructions. Take 75 mcg by mouth every other day before breakfast, alternating with 37.5 mcg)  . Lidocaine-Menthol (ICY HOT LIDOCAINE PLUS MENTHOL) 4-1 % PTCH Apply 1 patch topically daily as needed (pain).   . Magnesium 250 MG TABS Take 250 mg by mouth daily.  . Magnesium 500 MG CAPS Take 1 capsule by mouth daily.   . meloxicam (MOBIC) 7.5 MG tablet Take 1 tablet (7.5 mg total) by mouth daily.  . montelukast (SINGULAIR) 10 MG tablet Take 1 tablet (10 mg total) by mouth at bedtime.  . pantoprazole (PROTONIX) 40 MG tablet TAKE 1 TABLET TWICE DAILY (Patient taking differently: Take 40 mg by mouth 2 (two) times daily. )  . potassium chloride (KLOR-CON 10) 10 MEQ tablet Take 1 tablet (10 mEq total) by mouth daily.  Marland Kitchen PROAIR RESPICLICK 314 (90 Base) MCG/ACT AEPB Inhale 2 puffs into the lungs 4 (four) times daily as needed.   No facility-administered encounter medications on file as of 11/20/2019.    Allergies (verified) Aspirin   History: Past Medical History:  Diagnosis Date  . Anxiety   . Arteriosclerotic cardiovascular disease (ASCVD) 2013   coronary  calcification-left main, LAD and CX; small pericardial effusion  . Arthritis   . Chronic back pain   . Chronic bronchitis   . Chronic lung disease    Chronic scarring and volume loss-left lung; characteristics of a chronic infectious process-possible MAI  . Depression   . Dyspnea   . GERD (gastroesophageal reflux disease)    Dr Gala Romney EGD 09/2009->esophagitis, sm HH, antral erosions, atonic esophagus  . Hyperlipidemia   . Hypertension   . Hypothyroid 1981 approx  . Scleroderma ([place])   . Seasonal allergies   . Syncope    Multiple spells over the past 40+  years, likely neurocardiogenic  . Tobacco abuse 06/24/2009   Past Surgical History:  Procedure Laterality Date  . BILATERAL SALPINGOOPHORECTOMY  2013    Dr. Elonda Husky; uterus remains in situ  . COLONOSCOPY  09/28/09   anal papilla otherwise normal  . COLONOSCOPY N/A 08/29/2012   POE:UMPNTIR polyp-removed as described above. tubular adenoma  . COLONOSCOPY WITH PROPOFOL N/A 11/11/2019   Procedure: COLONOSCOPY WITH PROPOFOL;  Surgeon: Daneil Dolin, MD;  [facility]: [facility];  Service: Endoscopy;  Laterality: N/A;  2:30pm  . ESOPHAGOGASTRODUODENOSCOPY  10/07/09   Dr. Barrie Dunker of esophageal mucosa, diffusely ?esophagitis (bx benign), small HH/antral erosions, erythema bx benign. 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 ESOPHAGOGASTRODUODENOSCOPY (EGD) WITH ESOPHAGEAL DILATION N/A 03/07/2012   WER:[id number], patent, tubular esophagus of uncertain significance-status post biopsy )unremarkable). Hiatal hernia  . FUDUCIAL PLACEMENT  10/18/2017   Procedure: PLACEMENT OF 3 FUDUCIAL INTO LEFT LOWER LOBE OF LUNG-TARGET 1;  Surgeon: Collene Gobble, MD;  Location: [facility];  Service: Thoracic;;  . LAPAROSCOPIC APPENDECTOMY  07/16/2011   Procedure: APPENDECTOMY LAPAROSCOPIC;  Surgeon: Donato Heinz, MD;  Location: [facility];  Service: General;  Laterality: N/A;  . POLYPECTOMY  11/11/2019   Procedure: POLYPECTOMY;  Surgeon: Daneil Dolin, MD;  Location: [facility];  Service: Endoscopy;;  . TUBAL LIGATION    . VIDEO BRONCHOSCOPY  10/18/2017  . VIDEO BRONCHOSCOPY WITH ENDOBRONCHIAL NAVIGATION N/A 10/18/2017   Procedure: VIDEO BRONCHOSCOPY WITH ENDOBRONCHIAL NAVIGATION;  Surgeon: Collene Gobble, MD;  Location: [facility];  Service: Thoracic;  Laterality: N/A;   Family History  Problem Relation Age of Onset  . Stroke Father   . Aneurysm Father   . Coronary artery disease Brother   . Hypertension Brother   . Down syndrome Brother   . Hypertension Brother   . Hypertension Brother         father/mother  . Aneurysm Mother   . Throat cancer Brother 49       throat cancer  . Hypertension Sister   . Diabetes Brother   . Diabetes Brother   . Anesthesia problems Neg Hx   . Hypotension Neg Hx   . Malignant hyperthermia Neg Hx   . Pseudochol deficiency Neg Hx   . Colon cancer Neg Hx    Social History   Socioeconomic History  . Marital status: Married    Spouse name: Not on file  . Number of children: 2  . Years of education: Not on file  . Highest education level: Not on file  Occupational History  . Occupation: disabled    Fish farm manager: NOT EMPLOYED  Tobacco Use  . Smoking status: Current Every Day Smoker    Packs/day: 0.50    Years: 45.00    Pack years: 22.50    Types: Cigarettes    Start date: 07/14/1971  . Smokeless tobacco: Never Used  . Tobacco  comment: 0.5 PPD current plans to reduce 11/04/19 ARJ  Vaping Use  . Vaping Use: Never used  Substance and Sexual Activity  . Alcohol use: Not Currently    Alcohol/week: 0.0 standard drinks    Comment: quit June 2012-used to drink 6 pack daily  . Drug use: No  . Sexual activity: Yes    Birth control/protection: Post-menopausal  Other Topics Concern  . Not on file  Social History Narrative  . Not on file   Social Determinants of Health   Financial Resource Strain: Low Risk   . Difficulty of Paying Living Expenses: Not hard at all  Food Insecurity: No Food Insecurity  . Worried About Charity fundraiser in the Last Year: Never true  . Ran Out of Food in the Last Year: Never true  Transportation Needs: No Transportation Needs  . Lack of Transportation (Medical): No  . Lack of Transportation (Non-Medical): No  Physical Activity: Inactive  . Days of Exercise per Week: 0 days  . Minutes of Exercise per Session: 0 min  Stress: No Stress Concern Present  . Feeling of Stress : Not at all  Social Connections: Moderately Integrated  . Frequency of Communication with Friends and Family: More than three times a week   . Frequency of Social Gatherings with Friends and Family: More than three times a week  . Attends Religious Services: More than 4 times per year  . Active Member of Clubs or Organizations: No  . Attends Archivist Meetings: Never  . Marital Status: Married    Tobacco Counseling Ready to quit: Yes Counseling given: Yes Comment: 0.5 PPD current plans to reduce 11/04/19 ARJ   Clinical Intake:     Pain Score: 6      BMI - recorded: 21.14 Nutritional Status: BMI of 19-24  Normal Nutritional Risks: None Diabetes: No  How often do you need to have someone help you when you read instructions, pamphlets, or other written materials from your doctor or pharmacy?: 1 - Never What is the last grade level you completed in school?: 12  Diabetic? no  Interpreter Needed?: No  Information entered by :: Laretta Bolster, LPN   Activities of Daily Living In your present state of health, do you have any difficulty performing the following activities: 11/20/2019 11/07/2019  Hearing? N N  Vision? Y N  Difficulty concentrating or making decisions? N N  Walking or climbing stairs? Y N  Dressing or bathing? N N  Doing errands, shopping? N -  Preparing Food and eating ? N -  Using the Toilet? N -  In the past six months, have you accidently leaked urine? N -  Do you have problems with loss of bowel control? N -  Managing your Medications? N -  Managing your Finances? N -  Housekeeping or managing your Housekeeping? N -  Some recent data might be hidden    Patient Care Team: [name], MD as PCP - General Rothbart, [name], MD (Inactive) (Cardiology) Gala Romney [name], MD (Gastroenterology) Teena Irani, PTA as Physical Therapy Assistant (Physical Therapy) Leta Baptist, MD as Consulting Physician (Otolaryngology)  Indicate any recent Medical Services you may have received from other than [facility] providers in the past year (date may be approximate).     Assessment:   This  is a routine wellness examination for Tiah.  Hearing/Vision screen No exam data present  Dietary issues and exercise activities discussed: Current Exercise Habits: The patient does not participate  in regular exercise at present, Exercise limited by: orthopedic condition(s)  Goals    . Quit smoking / using tobacco     Patient would like to quit smoking. Recommend smoking cessation classes.      Depression Screen PHQ 2/9 Scores 11/20/2019 11/20/2019 11/14/2019 09/18/2019 05/16/2019 10/30/2018 07/10/2018  PHQ - 2 Score 0 0 0 0 0 0 0  PHQ- 9 Score - - - - - - -    Fall Risk Fall Risk  11/20/2019 11/14/2019 09/18/2019 05/16/2019 10/30/2018  Falls in the past year? 1 1 1  0 0  Number falls in past yr: 0 0 0 0 0  Injury with Fall? 0 0 0 0 0  Risk Factor Category  - - - - -  Risk for fall due to : No Fall Risks - - - -  Follow up Falls evaluation completed - - Falls evaluation completed -    Any stairs in or around the home? no If so, are there any without handrails? n/a Home free of loose throw rugs in walkways, pet beds, electrical cords, etc? yes Adequate lighting in your home to reduce risk of falls? yes  ASSISTIVE DEVICES UTILIZED TO PREVENT FALLS:  Life alert? yes Use of a cane, walker or w/c? no Grab bars in the bathroom? no Shower chair or bench in shower? no Elevated toilet seat or a handicapped toilet? no  TIMED UP AND GO:  Was the test performed? no  Length of time to ambulate n/a    Cognitive Function:     6CIT Screen 11/20/2019 07/05/2018 07/03/2017 03/01/2016  What Year? 0 points 0 points 0 points 0 points  What month? 0 points 0 points 0 points 0 points  What time? 0 points 0 points 0 points 0 points  Count back from 20 0 points 0 points 0 points 0 points  Months in reverse 2 points 0 points 0 points 0 points  Repeat phrase 0 points 0 points 0 points 0 points  Total Score 2 0 0 0    Immunizations Immunization History  Administered Date(s) Administered  .  Influenza Split 10/12/2011  . Influenza Whole 10/13/2009  . Influenza,inj,Quad PF,6+ Mos 11/01/2012, 09/16/2013, 11/20/2014, 10/27/2015, 11/28/2016, 09/26/2017, 10/30/2018, 09/18/2019  . Pneumococcal Conjugate-13 12/20/2013  . Pneumococcal Polysaccharide-23 10/30/2018  . Td 06/24/2009  . Tdap 11/17/2010    Tdap 11/17/10 Flu: 03/18/19 Pneumococcal: complete Covid: declined  Qualifies for Shingles Vaccine? yes Zostavax completed no Shingrix: no  Screening Tests Health Maintenance  Topic Date Due  . COVID-19 Vaccine (1) Never done  . PAP SMEAR-Modifier  11/29/2019  . TETANUS/TDAP  11/16/2020  . MAMMOGRAM  09/24/2021  . COLONOSCOPY  11/10/2029  . INFLUENZA VACCINE  Completed  . Hepatitis C Screening  Completed  . HIV Screening  Completed    Health Maintenance  Health Maintenance Due  Topic Date Due  . COVID-19 Vaccine (1) Never done  . PAP SMEAR-Modifier  11/29/2019    Colorectal: 11/11/19 Mammogram: 09/25/19 Bone Dexa: 2022 Lung Cancer Screening: (Low Dose CT Chest recommended if Age 65-80 years, 30 pack-year currently smoking OR have quit w/in 15years.) Does qualify.   Lung Cancer Screening Referral: yes  Additional Screening:  Hepatitis C Screening: does not qualify;   Vision Screening: Recommended annual ophthalmology exams for early detection of glaucoma and other disorders of the eye. Is the patient up to date with their annual eye exam?  yes Who is the provider or what is the name of the office in which  the patient attends annual eye exams?[facility] in [place] If pt is not established with a provider, would they like to be referred to a provider to establish care? n/a   Dental Screening: Recommended annual dental exams for proper oral hygiene  Community Resource Referral / Chronic Care Management: CRR required this visit? no  CCM required this visit?  no     Plan:     1. Encounter for Medicare annual wellness exam  I have personally reviewed and noted  the following in the patient's chart:   . Medical and social history . Use of alcohol, tobacco or illicit drugs  . Current medications and supplements . Functional ability and status . Nutritional status . Physical activity . Advanced directives . List of other physicians . Hospitalizations, surgeries, and ER visits in previous 12 months . Vitals . Screenings to include cognitive, depression, and falls . Referrals and appointments  In addition, I have reviewed and discussed with patient certain preventive protocols, quality metrics, and best practice recommendations. A written personalized care plan for preventive services as well as general preventive health recommendations were provided to patient.     Perlie Mayo, NP   11/20/2019   Nurse Notes: AWV conducted out of office over the phone with patient consent to do televisit via audio. Patient at home. Provider out of office. Visit took 35 minutes to complete.

## 2019-11-20 NOTE — Patient Instructions (Addendum)
[name] , Thank you for taking time to come for your Medicare Wellness Visit. I appreciate your ongoing commitment to your health goals. Please review the following plan we discussed and let me know if I can assist you in the future.   Screening recommendations/referrals: Colonoscopy: 11/10/29 Mammogram: 10/04/21 Bone Density: Age 64 Recommended yearly ophthalmology/optometry visit for glaucoma screening and checkup Recommended yearly dental visit for hygiene and checkup  Vaccinations: Influenza vaccine: Fall 2022 Pneumococcal vaccine: Complete Tdap vaccine: 11/16/20 Shingles vaccine: Declined  Advanced directives: No  Conditions/risks identified: None  Next appointment: No future appointments with [place] at this time.  Preventive Care 40-64 Years, Female Preventive care refers to lifestyle choices and visits with your health care provider that can promote health and wellness. What does preventive care include?  A yearly physical exam. This is also called an annual well check.  Dental exams once or twice a year.  Routine eye exams. Ask your health care provider how often you should have your eyes checked.  Personal lifestyle choices, including:  Daily care of your teeth and gums.  Regular physical activity.  Eating a healthy diet.  Avoiding tobacco and drug use.  Limiting alcohol use.  Practicing safe sex.  Taking low-dose aspirin daily starting at age 30.  Taking vitamin and mineral supplements as recommended by your health care provider. What happens during an annual well check? The services and screenings done by your health care provider during your annual well check will depend on your age, overall health, lifestyle risk factors, and family history of disease. Counseling  Your health care provider may ask you questions about your:  Alcohol use.  Tobacco use.  Drug use.  Emotional well-being.  Home and relationship well-being.  Sexual  activity.  Eating habits.  Work and work Statistician.  Method of birth control.  Menstrual cycle.  Pregnancy history. Screening  You may have the following tests or measurements:  Height, weight, and BMI.  Blood pressure.  Lipid and cholesterol levels. These may be checked every 5 years, or more frequently if you are over 64 years old.  Skin check.  Lung cancer screening. You may have this screening every year starting at age 58 if you have a 30-pack-year history of smoking and currently smoke or have quit within the past 15 years.  Fecal occult blood test (FOBT) of the stool. You may have this test every year starting at age 26.  Flexible sigmoidoscopy or colonoscopy. You may have a sigmoidoscopy every 5 years or a colonoscopy every 10 years starting at age 13.  Hepatitis C blood test.  Hepatitis B blood test.  Sexually transmitted disease (STD) testing.  Diabetes screening. This is done by checking your blood sugar (glucose) after you have not eaten for a while (fasting). You may have this done every 1-3 years.  Mammogram. This may be done every 1-2 years. Talk to your health care provider about when you should start having regular mammograms. This may depend on whether you have a family history of breast cancer.  BRCA-related cancer screening. This may be done if you have a family history of breast, ovarian, tubal, or peritoneal cancers.  Pelvic exam and Pap test. This may be done every 3 years starting at age 41. Starting at age 67, this may be done every 5 years if you have a Pap test in combination with an HPV test.  Bone density scan. This is done to screen for osteoporosis. You may have this scan if  you are at high risk for osteoporosis. Discuss your test results, treatment options, and if necessary, the need for more tests with your health care provider. Vaccines  Your health care provider may recommend certain vaccines, such as:  Influenza vaccine. This is  recommended every year.  Tetanus, diphtheria, and acellular pertussis (Tdap, Td) vaccine. You may need a Td booster every 10 years.  Zoster vaccine. You may need this after age 46.  Pneumococcal 13-valent conjugate (PCV13) vaccine. You may need this if you have certain conditions and were not previously vaccinated.  Pneumococcal polysaccharide (PPSV23) vaccine. You may need one or two doses if you smoke cigarettes or if you have certain conditions. Talk to your health care provider about which screenings and vaccines you need and how often you need them. This information is not intended to replace advice given to you by your health care provider. Make sure you discuss any questions you have with your health care provider. Document Released: 01/30/2015 Document Revised: 09/23/2015 Document Reviewed: 11/04/2014 Elsevier Interactive Patient Education  2017 [place] Prevention in the Home Falls can cause injuries. They can happen to people of all ages. There are many things you can do to make your home safe and to help prevent falls. What can I do on the outside of my home?  Regularly fix the edges of walkways and driveways and fix any cracks.  Remove anything that might make you trip as you walk through a door, such as a raised step or threshold.  Trim any bushes or trees on the path to your home.  Use bright outdoor lighting.  Clear any walking paths of anything that might make someone trip, such as rocks or tools.  Regularly check to see if handrails are loose or broken. Make sure that both sides of any steps have handrails.  Any raised decks and porches should have guardrails on the edges.  Have any leaves, snow, or ice cleared regularly.  Use sand or salt on walking paths during winter.  Clean up any spills in your garage right away. This includes oil or grease spills. What can I do in the bathroom?  Use night lights.  Install grab bars by the toilet and in  the tub and shower. Do not use towel bars as grab bars.  Use non-skid mats or decals in the tub or shower.  If you need to sit down in the shower, use a plastic, non-slip stool.  Keep the floor dry. Clean up any water that spills on the floor as soon as it happens.  Remove soap buildup in the tub or shower regularly.  Attach bath mats securely with double-sided non-slip rug tape.  Do not have throw rugs and other things on the floor that can make you trip. What can I do in the bedroom?  Use night lights.  Make sure that you have a light by your bed that is easy to reach.  Do not use any sheets or blankets that are too big for your bed. They should not hang down onto the floor.  Have a firm chair that has side arms. You can use this for support while you get dressed.  Do not have throw rugs and other things on the floor that can make you trip. What can I do in the kitchen?  Clean up any spills right away.  Avoid walking on wet floors.  Keep items that you use a lot in easy-to-reach places.  If you  need to reach something above you, use a strong step stool that has a grab bar.  Keep electrical cords out of the way.  Do not use floor polish or wax that makes floors slippery. If you must use wax, use non-skid floor wax.  Do not have throw rugs and other things on the floor that can make you trip. What can I do with my stairs?  Do not leave any items on the stairs.  Make sure that there are handrails on both sides of the stairs and use them. Fix handrails that are broken or loose. Make sure that handrails are as long as the stairways.  Check any carpeting to make sure that it is firmly attached to the stairs. Fix any carpet that is loose or worn.  Avoid having throw rugs at the top or bottom of the stairs. If you do have throw rugs, attach them to the floor with carpet tape.  Make sure that you have a light switch at the top of the stairs and the bottom of the stairs. If  you do not have them, ask someone to add them for you. What else can I do to help prevent falls?  Wear shoes that:  Do not have high heels.  Have rubber bottoms.  Are comfortable and fit you well.  Are closed at the toe. Do not wear sandals.  If you use a stepladder:  Make sure that it is fully opened. Do not climb a closed stepladder.  Make sure that both sides of the stepladder are locked into place.  Ask someone to hold it for you, if possible.  Clearly mark and make sure that you can see:  Any grab bars or handrails.  First and last steps.  Where the edge of each step is.  Use tools that help you move around (mobility aids) if they are needed. These include:  Canes.  Walkers.  Scooters.  Crutches.  Turn on the lights when you go into a dark area. Replace any light bulbs as soon as they burn out.  Set up your furniture so you have a clear path. Avoid moving your furniture around.  If any of your floors are uneven, fix them.  If there are any pets around you, be aware of where they are.  Review your medicines with your doctor. Some medicines can make you feel dizzy. This can increase your chance of falling. Ask your doctor what other things that you can do to help prevent falls. This information is not intended to replace advice given to you by your health care provider. Make sure you discuss any questions you have with your health care provider. Document Released: 10/30/2008 Document Revised: 06/11/2015 Document Reviewed: 02/07/2014 Elsevier Interactive Patient Education  2017 [employer].

## 2019-11-20 NOTE — Progress Notes (Signed)
Anesthesia Chart Review: Kelly Douglas   Case: [id number] Date/Time: 11/21/19 0915   Procedure: VIDEO BRONCHOSCOPY WITH ENDOBRONCHIAL NAVIGATION (Left )   Anesthesia type: General   Pre-op diagnosis: LEFT LOWER LOBE NODULE   Location: [facility] / [place] [facility]   Surgeons: Collene Gobble, MD      DISCUSSION: Patient is a 64 year old female scheduled for the above procedure.   History includes smoking, scleroderma, hypothyroidism, syncope (recurrent, likely neurocardiogenic), HLD, HTN, dyspnea, GERD, chronic back pain. She has known coronary atherosclerosis on chest CT since at least 2013 (no stress test ordered at cardiology visits 05/05/11, 08/19/14).  Patient with history of chronic chest CT abnormalities (multiple nodules) previously characterized by scarring and possible mycobacterial disease with navigational bronchoscopy 10/2017 negative for malignancy with 08/05/19 CT showing "continued gradual increase in her left lower lobe part solid nodule now gaining some solid component" and repeat navigational bronchoscopy with biopsies recommended.   If she has not had a preprocedure COVID-19 test then it will need to be done on the day of surgery (Dr. Agustina Caroli staff notified her to arrive 3 hours early.)   VS:  BP Readings from Last 3 Encounters:  11/11/19 126/82  11/04/19 124/78  09/18/19 133/81   Pulse Readings from Last 3 Encounters:  11/11/19 (!) 101  11/04/19 (!) 101  09/18/19 98    PROVIDERS: [name], MD is PCP  - Baltazar Apo, MD is pulmonology - Manus Rudd, MD is GI - Carlyle Dolly, MD is cardiologist. Last visit 08/19/14 due to findings of coronary atherosclerosis on chest CT (noted dating back to 2013 and saw Jacqulyn Ducking, MD then) and history of recurrent syncope (since a young age). Normal EF by echo. No history of stress or cath. No arrhythmias on event monitor. Continued risk factor modification for CAD recommended. No cardiac cause for syncope  identified. She has not had further follow-up.    LABS: She is for labs on the day of surgery. Last H/H 12.3/36.6, PLT 294, glucose 86, Cr 0.83 11/04/19.     PFTs 09/05/17: FVC 3.80 (124%), FEV1 2.34 (97%), DLCO unc 8.34 (28%).  Spirometry shows no ventilatory defect but does show airflow obstruction. No BD response. Lung volumes and airway resistance are normal. DLCO severely reduced.   IMAGES: CT Chest 08/05/19: IMPRESSION: 1. Since 05/18/2017 there has been continued gradual increase in size of the left lower lobe part solid nodule (which appears predominantly solid on today's study) when compared with 01/25/2018 and 03/28/2018. Findings are suspicious for low-grade indolent neoplasm such as pulmonary adenocarcinoma. 2. Unchanged appearance of the left upper lobe with extensive postinflammatory changes and volume loss resulting in shift of the mediastinum leftward. 3. Diffuse bronchial wall thickening with emphysema, as above; imaging findings suggestive of underlying COPD. 4. Multi vessel coronary artery atherosclerotic calcifications. 5. Dilated, patulous appearing esophagus with air-fluid level identified just below the level of the carina. No discrete esophageal mass identified. 6. Emphysema and aortic atherosclerosis.   EKG: 11/07/19: Normal sinus rhythm Possible Anterior infarct , age undetermined Abnormal ECG Confirmed by Asencion Noble [phone]) on 11/10/2019 10:10:19 AM   CV: Echo 09/05/17: Study Conclusions - Left ventricle: The cavity size was normal. Wall thickness was normal. Systolic function was normal. The estimated ejection fraction was in the range of 60% to 65%. Abnormal diastolic function, indeterminant grade. Wall motion was normal; there were no regional wall motion abnormalities. - Aortic valve: Valve area (VTI): 1.86 cm^2. Valve area (Vmax): 1.84  cm^2. Valve area (Vmean): 1.97 cm^2. - Technically adequate study.   Past Medical History:   Diagnosis Date  . Anxiety   . Arteriosclerotic cardiovascular disease (ASCVD) 2013   coronary calcification-left main, LAD and CX; small pericardial effusion  . Arthritis   . Chronic back pain   . Chronic bronchitis   . Chronic lung disease    Chronic scarring and volume loss-left lung; characteristics of a chronic infectious process-possible MAI  . Depression   . Dyspnea   . GERD (gastroesophageal reflux disease)    Dr Gala Romney EGD 09/2009->esophagitis, sm HH, antral erosions, atonic esophagus  . Hyperlipidemia   . Hypertension   . Hypothyroid 1981 approx  . Scleroderma ([place])   . Seasonal allergies   . Syncope    Multiple spells over the past 40+ years, likely neurocardiogenic  . Tobacco abuse 06/24/2009    Past Surgical History:  Procedure Laterality Date  . BILATERAL SALPINGOOPHORECTOMY  2013    Dr. Elonda Husky; uterus remains in situ  . COLONOSCOPY  09/28/09   anal papilla otherwise normal  . COLONOSCOPY N/A 08/29/2012   [facility]:[id number] polyp-removed as described above. tubular adenoma  . COLONOSCOPY WITH PROPOFOL N/A 11/11/2019   Procedure: COLONOSCOPY WITH PROPOFOL;  Surgeon: Daneil Dolin, MD;  Location: [facility];  Service: Endoscopy;  Laterality: N/A;  2:30pm  . ESOPHAGOGASTRODUODENOSCOPY  10/07/09   Dr. Barrie Dunker of esophageal mucosa, diffusely ?esophagitis (bx benign), small HH/antral erosions, erythema bx benign. 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 ESOPHAGOGASTRODUODENOSCOPY (EGD) WITH ESOPHAGEAL DILATION N/A 03/07/2012   OTL:[id number], patent, tubular esophagus of uncertain significance-status post biopsy )unremarkable). Hiatal hernia  . FUDUCIAL PLACEMENT  10/18/2017   Procedure: PLACEMENT OF 3 FUDUCIAL INTO LEFT LOWER LOBE OF LUNG-TARGET 1;  Surgeon: Collene Gobble, MD;  Location: [facility];  Service: Thoracic;;  . LAPAROSCOPIC APPENDECTOMY  07/16/2011   Procedure: APPENDECTOMY LAPAROSCOPIC;  Surgeon: Donato Heinz, MD;  Location: [facility];  Service: General;   Laterality: N/A;  . POLYPECTOMY  11/11/2019   Procedure: POLYPECTOMY;  Surgeon: Daneil Dolin, MD;  Location: [facility];  Service: Endoscopy;;  . TUBAL LIGATION    . VIDEO BRONCHOSCOPY  10/18/2017  . VIDEO BRONCHOSCOPY WITH ENDOBRONCHIAL NAVIGATION N/A 10/18/2017   Procedure: VIDEO BRONCHOSCOPY WITH ENDOBRONCHIAL NAVIGATION;  Surgeon: Collene Gobble, MD;  Location: [facility];  Service: Thoracic;  Laterality: N/A;    MEDICATIONS: No current facility-administered medications for this encounter.   Marland Kitchen acetaminophen (TYLENOL) 650 MG CR tablet  . amLODipine (NORVASC) 5 MG tablet  . atorvastatin (LIPITOR) 10 MG tablet  . Cholecalciferol (VITAMIN D) 125 MCG (5000 UT) CAPS  . DULoxetine (CYMBALTA) 60 MG capsule  . hydrOXYzine (ATARAX/VISTARIL) 25 MG tablet  . hydrOXYzine (VISTARIL) 50 MG capsule  . levothyroxine (SYNTHROID) 75 MCG tablet  . Lidocaine-Menthol (ICY HOT LIDOCAINE PLUS MENTHOL) 4-1 % PTCH  . Magnesium 250 MG TABS  . Magnesium 500 MG CAPS  . meloxicam (MOBIC) 7.5 MG tablet  . montelukast (SINGULAIR) 10 MG tablet  . pantoprazole (PROTONIX) 40 MG tablet  . potassium chloride (KLOR-CON 10) 10 MEQ tablet  . PROAIR RESPICLICK 597 (90 Base) MCG/ACT AEPB    Myra Gianotti, PA-C Surgical Short Stay/Anesthesiology [facility] Phone [phone] [facility] Phone [phone] 11/20/2019 12:45 PM

## 2019-11-21 ENCOUNTER — Ambulatory Visit (HOSPITAL_COMMUNITY)
Admission: RE | Admit: 2019-11-21 | Discharge: 2019-11-21 | Disposition: A | Discharge status: Home / Self Care | Payer: Medicare HMO | Attending: Emergency Medicine | Admitting: Emergency Medicine

## 2019-11-21 ENCOUNTER — Ambulatory Visit (HOSPITAL_COMMUNITY): Payer: Medicare HMO | Admitting: Vascular Surgery

## 2019-11-21 ENCOUNTER — Encounter (HOSPITAL_COMMUNITY): Payer: Self-pay | Admitting: Emergency Medicine

## 2019-11-21 ENCOUNTER — Ambulatory Visit (HOSPITAL_COMMUNITY): Payer: Medicare HMO

## 2019-11-21 ENCOUNTER — Encounter (HOSPITAL_COMMUNITY)
Admission: RE | Disposition: A | Discharge status: Home / Self Care | Payer: Self-pay | Source: Home / Self Care | Attending: Emergency Medicine

## 2019-11-21 DIAGNOSIS — C3432 Malignant neoplasm of lower lobe, left bronchus or lung: Secondary | ICD-10-CM | POA: Diagnosis not present

## 2019-11-21 DIAGNOSIS — R911 Solitary pulmonary nodule: Secondary | ICD-10-CM | POA: Diagnosis not present

## 2019-11-21 DIAGNOSIS — C3492 Malignant neoplasm of unspecified part of left bronchus or lung: Secondary | ICD-10-CM | POA: Diagnosis present

## 2019-11-21 DIAGNOSIS — Z20822 Contact with and (suspected) exposure to covid-19: Secondary | ICD-10-CM | POA: Insufficient documentation

## 2019-11-21 DIAGNOSIS — Z9889 Other specified postprocedural states: Secondary | ICD-10-CM | POA: Diagnosis not present

## 2019-11-21 DIAGNOSIS — E785 Hyperlipidemia, unspecified: Secondary | ICD-10-CM | POA: Diagnosis not present

## 2019-11-21 DIAGNOSIS — F418 Other specified anxiety disorders: Secondary | ICD-10-CM | POA: Diagnosis not present

## 2019-11-21 DIAGNOSIS — E039 Hypothyroidism, unspecified: Secondary | ICD-10-CM | POA: Diagnosis not present

## 2019-11-21 HISTORY — PX: BRONCHIAL WASHINGS: SHX5105

## 2019-11-21 HISTORY — PX: BRONCHIAL NEEDLE ASPIRATION BIOPSY: SHX5106

## 2019-11-21 HISTORY — PX: BRONCHIAL BIOPSY: SHX5109

## 2019-11-21 HISTORY — PX: VIDEO BRONCHOSCOPY WITH ENDOBRONCHIAL NAVIGATION: SHX6175

## 2019-11-21 HISTORY — PX: BRONCHIAL BRUSHINGS: SHX5108

## 2019-11-21 LAB — BASIC METABOLIC PANEL
Anion gap: 13 (ref 5–15)
BUN: 6 mg/dL — ABNORMAL LOW (ref 8–23)
CO2: 27 mmol/L (ref 22–32)
Calcium: 9 mg/dL (ref 8.9–10.3)
Chloride: 99 mmol/L (ref 98–111)
Creatinine, Ser: 0.76 mg/dL (ref 0.44–1.00)
GFR, Estimated: >60 mL/min (ref >60)
Glucose, Bld: 83 mg/dL (ref 70–99)
Potassium: 2.9 mmol/L — ABNORMAL LOW (ref 3.5–5.1)
Sodium: 139 mmol/L (ref 135–145)

## 2019-11-21 LAB — CBC
HCT: 35.8 % — ABNORMAL LOW (ref 36.0–46.0)
Hemoglobin: 11.6 g/dL — ABNORMAL LOW (ref 12.0–15.0)
MCH: 26.1 pg (ref 26.0–34.0)
MCHC: 32.4 g/dL (ref 30.0–36.0)
MCV: 80.4 fL (ref 80.0–100.0)
Platelets: 318 10*3/uL (ref 150–400)
RBC: 4.45 MIL/uL (ref 3.87–5.11)
RDW: 14.7 % (ref 11.5–15.5)
WBC: 10.5 10*3/uL (ref 4.0–10.5)
nRBC: 0 % (ref 0.0–0.2)

## 2019-11-21 LAB — SARS CORONAVIRUS 2 BY RT PCR (HOSPITAL ORDER, PERFORMED IN ~~LOC~~ HOSPITAL LAB): SARS Coronavirus 2: NEGATIVE

## 2019-11-21 SURGERY — VIDEO BRONCHOSCOPY WITH ENDOBRONCHIAL NAVIGATION
Anesthesia: General | Laterality: Left

## 2019-11-21 MED ORDER — PHENYLEPHRINE 40 MCG/ML (10ML) SYRINGE FOR IV PUSH (FOR BLOOD PRESSURE SUPPORT)
PREFILLED_SYRINGE | INTRAVENOUS | Status: DC | PRN
  Administered 2019-11-21 (×2): 80 ug via INTRAVENOUS
  Administered 2019-11-21: 120 ug via INTRAVENOUS
  Administered 2019-11-21: 80 ug via INTRAVENOUS

## 2019-11-21 MED ORDER — SUGAMMADEX SODIUM 200 MG/2ML IV SOLN
INTRAVENOUS | Status: DC | PRN
  Administered 2019-11-21: 200 mg via INTRAVENOUS

## 2019-11-21 MED ORDER — CHLORHEXIDINE GLUCONATE 0.12 % MT SOLN
OROMUCOSAL | Status: AC
  Administered 2019-11-21: 15 mL via OROMUCOSAL
  Filled 2019-11-21: qty 15

## 2019-11-21 MED ORDER — DULOXETINE HCL 60 MG PO CPEP
60.0000 mg | ORAL_CAPSULE | Freq: Every day | ORAL | Status: DC
Start: 2019-11-21 — End: 2020-01-07

## 2019-11-21 MED ORDER — PROPOFOL 10 MG/ML IV BOLUS
INTRAVENOUS | Status: DC | PRN
  Administered 2019-11-21: 100 mg via INTRAVENOUS

## 2019-11-21 MED ORDER — ROCURONIUM BROMIDE 10 MG/ML (PF) SYRINGE
PREFILLED_SYRINGE | INTRAVENOUS | Status: DC | PRN
  Administered 2019-11-21: 50 mg via INTRAVENOUS

## 2019-11-21 MED ORDER — EPHEDRINE SULFATE-NACL 50-0.9 MG/10ML-% IV SOSY
PREFILLED_SYRINGE | INTRAVENOUS | Status: DC | PRN
  Administered 2019-11-21: 5 mg via INTRAVENOUS

## 2019-11-21 MED ORDER — CHLORHEXIDINE GLUCONATE 0.12 % MT SOLN: 15.0000 mL | Freq: Once | OROMUCOSAL | Status: AC

## 2019-11-21 MED ORDER — FENTANYL CITRATE (PF) 100 MCG/2ML IJ SOLN
INTRAMUSCULAR | Status: DC | PRN
Start: 2019-11-21 — End: 2019-11-21
  Administered 2019-11-21: 50 ug via INTRAVENOUS
  Administered 2019-11-21: 100 ug via INTRAVENOUS
  Administered 2019-11-21: 50 ug via INTRAVENOUS

## 2019-11-21 MED ORDER — ACETAMINOPHEN 500 MG PO TABS
1000.0000 mg | ORAL_TABLET | Freq: Once | ORAL | Status: AC
  Administered 2019-11-21: 1000 mg via ORAL
  Filled 2019-11-21: qty 2

## 2019-11-21 MED ORDER — LEVOTHYROXINE SODIUM 75 MCG PO TABS
37.5000 ug | ORAL_TABLET | ORAL | Status: DC
Start: 2019-11-21 — End: 2020-01-07

## 2019-11-21 MED ORDER — DEXAMETHASONE SODIUM PHOSPHATE 10 MG/ML IJ SOLN
INTRAMUSCULAR | Status: DC | PRN
  Administered 2019-11-21: 10 mg via INTRAVENOUS

## 2019-11-21 MED ORDER — LACTATED RINGERS IV SOLN: INTRAVENOUS | Status: DC

## 2019-11-21 MED ORDER — LIDOCAINE 2% (20 MG/ML) 5 ML SYRINGE
INTRAMUSCULAR | Status: DC | PRN
  Administered 2019-11-21: 60 mg via INTRAVENOUS

## 2019-11-21 MED ORDER — ONDANSETRON HCL 4 MG/2ML IJ SOLN
INTRAMUSCULAR | Status: DC | PRN
  Administered 2019-11-21: 4 mg via INTRAVENOUS

## 2019-11-21 NOTE — Anesthesia Procedure Notes (Signed)
Procedure Name: Intubation Date/Time: 11/21/2019 10:10 AM Performed by: Harden Mo, CRNA Pre-anesthesia Checklist: Patient identified, Emergency Drugs available, Suction available and Patient being monitored Patient Re-evaluated:Patient Re-evaluated prior to induction Oxygen Delivery Method: Circle System Utilized Preoxygenation: Pre-oxygenation with 100% oxygen Induction Type: IV induction Ventilation: Mask ventilation without difficulty and Oral airway inserted - appropriate to patient size Laryngoscope Size: Sabra Heck and 2 Grade View: Grade I Tube type: Oral Tube size: 8.5 mm Number of attempts: 1 Airway Equipment and Method: Stylet and Oral airway Placement Confirmation: ETT inserted through vocal cords under direct vision,  positive ETCO2 and breath sounds checked- equal and bilateral Secured at: 22 cm Tube secured with: Tape Dental Injury: Teeth and Oropharynx as per pre-operative assessment

## 2019-11-21 NOTE — Op Note (Signed)
Video Bronchoscopy with Electromagnetic Navigation Procedure Note  Date of Operation: 11/21/2019  Pre-op Diagnosis: Enlarging left lower lobe pulmonary nodule  Post-op Diagnosis: Same  Surgeon: Baltazar Apo  Assistants: None  Anesthesia: General endotracheal anesthesia  Operation: Flexible video fiberoptic bronchoscopy with electromagnetic navigation and biopsies.  Estimated Blood Loss: 25 cc  Complications: None apparent  Indications and History: Kelly Douglas is a 64 y.o. female with history of scleroderma, tobacco use, mild COPD, CAD.  We have been following a left lower lobe pulmonary nodule that was negative on navigational bronchoscopy in October 2019.  Is been a gradual increase in size on serial films most recent 08/05/2019.  Recommendation was made to achieve tissue diagnosis via a repeat navigational bronchoscopy.  The risks, benefits, complications, treatment options and expected outcomes were discussed with the patient.  The possibilities of pneumothorax, pneumonia, reaction to medication, pulmonary aspiration, perforation of a viscus, bleeding, failure to diagnose a condition and creating a complication requiring transfusion or operation were discussed with the patient who freely signed the consent.    Description of Procedure: The patient was seen in the Preoperative Area, was examined and was deemed appropriate to proceed.  The patient was taken to [facility] endoscopy room 2, identified as Kelly Douglas and the procedure verified as Flexible Video Fiberoptic Bronchoscopy.  A Time Out was held and the above information confirmed.   Prior to the date of the procedure a high-resolution CT scan of the chest was performed. Utilizing [place] a virtual tracheobronchial tree was generated to allow the creation of distinct navigation pathways to the patient's parenchymal abnormalities. After being taken to the operating room general anesthesia was initiated and  the patient  was orally intubated. The video fiberoptic bronchoscope was introduced via the endotracheal tube and a general inspection was performed which showed normal airways on the right.  There was an area of vascular ectasia, possible AV malformation in the left upper lobe apical segmental airway.  The mucosa was smooth, not raised, no indication of a true endobronchial lesion.  The area was not sampled.. The extendable working channel and locator guide were introduced into the bronchoscope. The distinct navigation pathways prepared prior to this procedure were then utilized to navigate to within 0.5-1.0 cm of patient's lesions identified on CT scan:  Target 1 left lower lobe enlarging pulmonary nodule Target 2 apical left upper lobe linear scar  The extendable working channel was secured into place at each location and the locator guide was withdrawn. Under fluoroscopic guidance transbronchial needle brushings, transbronchial Wang needle biopsies, and transbronchial forceps biopsies were performed on the left lower lobe pulmonary nodule to be sent for cytology and pathology.  Fiducial markers had already been placed around this nodule on her first bronchoscopy.  Attention was then turned to the left upper lobe apical linear scarring that have been stable on CT scan.  Standard transbronchial brushings were performed after navigation to this area. A bronchioalveolar lavage was performed in the left upper lobe and sent for cytology and microbiology (bacterial, fungal, AFB smears and cultures). At the end of the procedure a general airway inspection was performed and there was no evidence of active bleeding. The bronchoscope was removed.  The patient tolerated the procedure well. There was no significant blood loss and there were no obvious complications. A post-procedural chest x-ray is pending.  Samples: 1. Transbronchial needle brushings from left lower lobe nodule 2. Transbronchial Wang needle  biopsies from left lower lobe nodule 3.  Transbronchial forceps biopsies from left lower lobe nodule 4. Bronchoalveolar lavage from left upper lobe 5. Transbronchial brushings from the left upper lobe linear scar  Plans:  The patient will be discharged from the PACU to home when recovered from anesthesia and after chest x-ray is reviewed. We will review the cytology, pathology and microbiology results with the patient when they become available. Outpatient followup will be with Dr. Lamonte Sakai.   Baltazar Apo, MD, PhD 11/21/2019, 11:08 AM [facility] [phone] or if no answer [phone]

## 2019-11-21 NOTE — Interval H&P Note (Signed)
History and Physical Interval Note:  11/21/2019 9:58 AM  Kelly Douglas  has presented today for surgery, with the diagnosis of LEFT LOWER LOBE NODULE.  The various methods of treatment have been discussed with the patient and family. After consideration of risks, benefits and other options for treatment, the patient has consented to  Procedure(s): [place] (Left) as a surgical intervention.  The patient's history has been reviewed, patient examined, no change in status, stable for surgery.  I have reviewed the patient's chart and labs.  Questions were answered to the patient's satisfaction.     Collene Gobble

## 2019-11-21 NOTE — Discharge Instructions (Signed)
Flexible Bronchoscopy, Care After This sheet gives you information about how to care for yourself after your test. Your doctor may also give you more specific instructions. If you have problems or questions, contact your doctor. Follow these instructions at home: Eating and drinking  Do not eat or drink anything (not even water) for 2 hours after your test, or until your numbing medicine (local anesthetic) wears off.  When your numbness is gone and your cough and gag reflexes have come back, you may: ? Eat only soft foods. ? Slowly drink liquids.  The day after the test, go back to your normal diet. Driving  Do not drive for 24 hours if you were given a medicine to help you relax (sedative).  Do not drive or use heavy machinery while taking prescription pain medicine. General instructions   Take over-the-counter and prescription medicines only as told by your doctor.  Return to your normal activities as told. Ask what activities are safe for you.  Do not use any products that have nicotine or tobacco in them. This includes cigarettes and e-cigarettes. If you need help quitting, ask your doctor.  Keep all follow-up visits as told by your doctor. This is important. It is very important if you had a tissue sample (biopsy) taken. Get help right away if:  You have shortness of breath that gets worse.  You get light-headed.  You feel like you are going to pass out (faint).  You have chest pain.  You cough up: ? More than a little blood. ? More blood than before. Summary  Do not eat or drink anything (not even water) for 2 hours after your test, or until your numbing medicine wears off.  Do not use cigarettes. Do not use e-cigarettes.  Get help right away if you have chest pain.  Please call our office for any questions or concerns.  [phone].  This information is not intended to replace advice given to you by your health care provider. Make sure you discuss any  questions you have with your health care provider. Document Revised: 12/16/2016 Document Reviewed: 01/22/2016 Elsevier Patient Education  2020 [employer].

## 2019-11-21 NOTE — Anesthesia Postprocedure Evaluation (Signed)
Anesthesia Post Note  Patient: [name]  Procedure(s) Performed: VIDEO BRONCHOSCOPY WITH ENDOBRONCHIAL NAVIGATION (Left ) BRONCHIAL BIOPSIES BRONCHIAL BRUSHINGS BRONCHIAL NEEDLE ASPIRATION BIOPSIES BRONCHIAL WASHINGS     Patient location during evaluation: PACU Anesthesia Type: General Level of consciousness: awake and alert Pain management: pain level controlled Vital Signs Assessment: post-procedure vital signs reviewed and stable Respiratory status: spontaneous breathing, nonlabored ventilation and respiratory function stable Cardiovascular status: blood pressure returned to baseline and stable Postop Assessment: no apparent nausea or vomiting Anesthetic complications: no   No complications documented.  Last Vitals:  Vitals:   11/21/19 1130 11/21/19 1145  BP: 115/62 118/62  Pulse: 90 82  Resp: (!) 25 19  Temp:    SpO2: 97% 97%    Last Pain:  Vitals:   11/21/19 1145  TempSrc:   PainSc: 0-No pain                 Daelyn Mozer,W. EDMOND

## 2019-11-21 NOTE — Transfer of Care (Signed)
Immediate Anesthesia Transfer of Care Note  Patient: Kelly Douglas  Procedure(s) Performed: VIDEO BRONCHOSCOPY WITH ENDOBRONCHIAL NAVIGATION (Left ) BRONCHIAL BIOPSIES BRONCHIAL BRUSHINGS BRONCHIAL NEEDLE ASPIRATION BIOPSIES BRONCHIAL WASHINGS  Patient Location: PACU  Anesthesia Type:General  Level of Consciousness: awake, alert  and oriented  Airway & Oxygen Therapy: Patient Spontanous Breathing and Patient connected to face mask oxygen  Post-op Assessment: Report given to RN, Post -op Vital signs reviewed and stable and Patient moving all extremities X 4  Post vital signs: Reviewed and stable  Last Vitals:  Vitals Value Taken Time  BP 116/63 11/21/19 1114  Temp    Pulse 88 11/21/19 1114  Resp 17 11/21/19 1114  SpO2 100 % 11/21/19 1114  Vitals shown include unvalidated device data.  Last Pain:  Vitals:   11/21/19 0737  TempSrc:   PainSc: 4       Patients Stated Pain Goal: 2 (99/06/89 3406)  Complications: No complications documented.

## 2019-11-22 LAB — CYTOLOGY - NON PAP

## 2019-11-25 ENCOUNTER — Encounter (HOSPITAL_COMMUNITY): Payer: Self-pay | Admitting: Emergency Medicine

## 2019-11-27 ENCOUNTER — Telehealth: Payer: Self-pay | Admitting: Emergency Medicine

## 2019-11-27 DIAGNOSIS — C3490 Malignant neoplasm of unspecified part of unspecified bronchus or lung: Secondary | ICD-10-CM

## 2019-11-27 NOTE — Telephone Encounter (Signed)
Called to discuss biopsy results with the patient, no answer.  Her brushings and biopsies of the left lower lobe lesion both show adenocarcinoma.  Her PFT from 2019 show that she could be a candidate for resection although she does have some underlying comorbidities.  I will plan to repeat her PET scan, repeat pulmonary function testing, refer her to [place] once I am able to speak with her to discuss results.

## 2019-11-28 ENCOUNTER — Other Ambulatory Visit: Payer: Self-pay | Admitting: *Deleted

## 2019-11-28 NOTE — Progress Notes (Signed)
The proposed treatment discussed in cancer conference on 11/28/19 is for discussion purpose only and is not a binding recommendation.  The patient was not physically examined nor present for their treatment options.  Therefore, final treatment plans cannot be decided.

## 2019-11-29 ENCOUNTER — Telehealth: Payer: Self-pay | Admitting: Emergency Medicine

## 2019-11-29 DIAGNOSIS — C3490 Malignant neoplasm of unspecified part of unspecified bronchus or lung: Secondary | ICD-10-CM

## 2019-11-29 NOTE — Telephone Encounter (Signed)
I called the patient and both of her emergency contacts, have been unable to reach her to discuss results. Will keep trying. If she does call back then she needs to be told that her biopsy results confirm adenocarcinoma ( I already gave her preliminary results of the same on the day of the procedure). She will needs PET scan and repeat PFT.

## 2019-11-29 NOTE — Telephone Encounter (Signed)
Called and spoke with patient, advised that we did not have the results of the bronc, that Dr. Lamonte Sakai is currently out of the office and that we will contact her after Dr. Lamonte Sakai has had a chance to review the results and responds back to [place].  She verbalized understanding.  Dr. Lamonte Sakai, Please advise on bronch results.  Thank you.

## 2019-12-02 ENCOUNTER — Other Ambulatory Visit: Payer: Self-pay | Admitting: Emergency Medicine

## 2019-12-02 DIAGNOSIS — C3490 Malignant neoplasm of unspecified part of unspecified bronchus or lung: Secondary | ICD-10-CM

## 2019-12-02 NOTE — Telephone Encounter (Signed)
I was able to reach her, discuss results with her.  She wants to be seen in [place], will refer to oncology there.  She may be a surgical candidate. Will obtain PET and full PFT

## 2019-12-02 NOTE — Telephone Encounter (Signed)
We do have the results. I have been trying to call her - see existing phone note.

## 2019-12-02 NOTE — Telephone Encounter (Signed)
We do have results - I have been trying to call her. See the existing phone note

## 2019-12-02 NOTE — Telephone Encounter (Signed)
Patient is calling for Bronch results. Patient phone number is [phone].

## 2019-12-02 NOTE — Telephone Encounter (Signed)
   9:49 AM Collene Gobble, MD routed this conversation to Collene Gobble, MD  Collene Gobble, MD  9:49 AM Note I called the patient and both of her emergency contacts, have been unable to reach her to discuss results. Will keep trying. If she does call back then she needs to be told that her biopsy results confirm adenocarcinoma ( I already gave her preliminary results of the same on the day of the procedure). She will needs PET scan and repeat PFT.      Spoke with the pt. She states that she just got off the phone with Dr Lamonte Sakai and he as relayed these results to her. I have placed order for PFT and PET to be done at [facility] per her request.

## 2019-12-09 ENCOUNTER — Ambulatory Visit: Payer: Medicare HMO | Admitting: Emergency Medicine

## 2019-12-09 ENCOUNTER — Other Ambulatory Visit: Payer: Self-pay

## 2019-12-09 ENCOUNTER — Ambulatory Visit (HOSPITAL_COMMUNITY)
Admission: RE | Admit: 2019-12-09 | Discharge: 2019-12-09 | Disposition: A | Discharge status: Home / Self Care | Payer: Medicare HMO | Source: Ambulatory Visit | Attending: Emergency Medicine | Admitting: Emergency Medicine

## 2019-12-09 DIAGNOSIS — I2510 Atherosclerotic heart disease of native coronary artery without angina pectoris: Secondary | ICD-10-CM | POA: Diagnosis present

## 2019-12-09 DIAGNOSIS — F17210 Nicotine dependence, cigarettes, uncomplicated: Secondary | ICD-10-CM | POA: Diagnosis present

## 2019-12-09 DIAGNOSIS — Y848 Other medical procedures as the cause of abnormal reaction of the patient, or of later complication, without mention of misadventure at the time of the procedure: Secondary | ICD-10-CM | POA: Diagnosis present

## 2019-12-09 DIAGNOSIS — E785 Hyperlipidemia, unspecified: Secondary | ICD-10-CM | POA: Diagnosis present

## 2019-12-09 DIAGNOSIS — R918 Other nonspecific abnormal finding of lung field: Secondary | ICD-10-CM | POA: Diagnosis not present

## 2019-12-09 DIAGNOSIS — Z79890 Hormone replacement therapy (postmenopausal): Secondary | ICD-10-CM | POA: Diagnosis not present

## 2019-12-09 DIAGNOSIS — Z9049 Acquired absence of other specified parts of digestive tract: Secondary | ICD-10-CM | POA: Diagnosis not present

## 2019-12-09 DIAGNOSIS — R911 Solitary pulmonary nodule: Secondary | ICD-10-CM | POA: Diagnosis not present

## 2019-12-09 DIAGNOSIS — C3490 Malignant neoplasm of unspecified part of unspecified bronchus or lung: Secondary | ICD-10-CM | POA: Insufficient documentation

## 2019-12-09 DIAGNOSIS — M349 Systemic sclerosis, unspecified: Secondary | ICD-10-CM | POA: Diagnosis present

## 2019-12-09 DIAGNOSIS — I517 Cardiomegaly: Secondary | ICD-10-CM | POA: Diagnosis not present

## 2019-12-09 DIAGNOSIS — J9383 Other pneumothorax: Secondary | ICD-10-CM | POA: Diagnosis not present

## 2019-12-09 DIAGNOSIS — Z4682 Encounter for fitting and adjustment of non-vascular catheter: Secondary | ICD-10-CM | POA: Diagnosis not present

## 2019-12-09 DIAGNOSIS — K219 Gastro-esophageal reflux disease without esophagitis: Secondary | ICD-10-CM | POA: Diagnosis present

## 2019-12-09 DIAGNOSIS — J939 Pneumothorax, unspecified: Secondary | ICD-10-CM | POA: Diagnosis not present

## 2019-12-09 DIAGNOSIS — I10 Essential (primary) hypertension: Secondary | ICD-10-CM | POA: Diagnosis present

## 2019-12-09 DIAGNOSIS — E876 Hypokalemia: Secondary | ICD-10-CM | POA: Diagnosis present

## 2019-12-09 DIAGNOSIS — D75838 Other thrombocytosis: Secondary | ICD-10-CM | POA: Diagnosis present

## 2019-12-09 DIAGNOSIS — I700 Atherosclerosis of aorta: Secondary | ICD-10-CM | POA: Diagnosis not present

## 2019-12-09 DIAGNOSIS — Z791 Long term (current) use of non-steroidal anti-inflammatories (NSAID): Secondary | ICD-10-CM | POA: Diagnosis not present

## 2019-12-09 DIAGNOSIS — J95811 Postprocedural pneumothorax: Secondary | ICD-10-CM | POA: Diagnosis present

## 2019-12-09 DIAGNOSIS — Z79899 Other long term (current) drug therapy: Secondary | ICD-10-CM | POA: Diagnosis not present

## 2019-12-09 DIAGNOSIS — C3432 Malignant neoplasm of lower lobe, left bronchus or lung: Secondary | ICD-10-CM | POA: Diagnosis present

## 2019-12-09 DIAGNOSIS — Z20822 Contact with and (suspected) exposure to covid-19: Secondary | ICD-10-CM | POA: Diagnosis present

## 2019-12-09 DIAGNOSIS — Z808 Family history of malignant neoplasm of other organs or systems: Secondary | ICD-10-CM | POA: Diagnosis not present

## 2019-12-09 DIAGNOSIS — J9811 Atelectasis: Secondary | ICD-10-CM | POA: Diagnosis not present

## 2019-12-09 DIAGNOSIS — E039 Hypothyroidism, unspecified: Secondary | ICD-10-CM | POA: Diagnosis present

## 2019-12-09 MED ORDER — FLUDEOXYGLUCOSE F - 18 (FDG) INJECTION
10.2400 | Freq: Once | INTRAVENOUS | Status: AC | PRN
  Administered 2019-12-09: 10.24 via INTRAVENOUS

## 2019-12-10 ENCOUNTER — Emergency Department (HOSPITAL_COMMUNITY): Payer: Medicare HMO

## 2019-12-10 ENCOUNTER — Encounter (HOSPITAL_COMMUNITY): Payer: Self-pay | Admitting: *Deleted

## 2019-12-10 ENCOUNTER — Other Ambulatory Visit: Payer: Self-pay

## 2019-12-10 ENCOUNTER — Telehealth: Payer: Self-pay | Admitting: Emergency Medicine

## 2019-12-10 ENCOUNTER — Inpatient Hospital Stay (HOSPITAL_COMMUNITY)
Admission: EM | Admit: 2019-12-10 | Discharge: 2019-12-12 | DRG: 200 | Disposition: A | Discharge status: Home / Self Care | Payer: Medicare HMO | Attending: Internal Medicine | Admitting: Internal Medicine

## 2019-12-10 DIAGNOSIS — E039 Hypothyroidism, unspecified: Secondary | ICD-10-CM | POA: Diagnosis present

## 2019-12-10 DIAGNOSIS — I10 Essential (primary) hypertension: Secondary | ICD-10-CM | POA: Diagnosis present

## 2019-12-10 DIAGNOSIS — M349 Systemic sclerosis, unspecified: Secondary | ICD-10-CM | POA: Diagnosis present

## 2019-12-10 DIAGNOSIS — Z9049 Acquired absence of other specified parts of digestive tract: Secondary | ICD-10-CM | POA: Diagnosis not present

## 2019-12-10 DIAGNOSIS — Z79890 Hormone replacement therapy (postmenopausal): Secondary | ICD-10-CM | POA: Diagnosis not present

## 2019-12-10 DIAGNOSIS — I517 Cardiomegaly: Secondary | ICD-10-CM | POA: Diagnosis not present

## 2019-12-10 DIAGNOSIS — I2510 Atherosclerotic heart disease of native coronary artery without angina pectoris: Secondary | ICD-10-CM | POA: Diagnosis present

## 2019-12-10 DIAGNOSIS — D75838 Other thrombocytosis: Secondary | ICD-10-CM | POA: Diagnosis present

## 2019-12-10 DIAGNOSIS — C3432 Malignant neoplasm of lower lobe, left bronchus or lung: Secondary | ICD-10-CM | POA: Diagnosis present

## 2019-12-10 DIAGNOSIS — Z79899 Other long term (current) drug therapy: Secondary | ICD-10-CM | POA: Diagnosis not present

## 2019-12-10 DIAGNOSIS — F17210 Nicotine dependence, cigarettes, uncomplicated: Secondary | ICD-10-CM | POA: Diagnosis present

## 2019-12-10 DIAGNOSIS — Z4682 Encounter for fitting and adjustment of non-vascular catheter: Secondary | ICD-10-CM

## 2019-12-10 DIAGNOSIS — Y848 Other medical procedures as the cause of abnormal reaction of the patient, or of later complication, without mention of misadventure at the time of the procedure: Secondary | ICD-10-CM | POA: Diagnosis present

## 2019-12-10 DIAGNOSIS — D75839 Thrombocytosis, unspecified: Secondary | ICD-10-CM | POA: Diagnosis present

## 2019-12-10 DIAGNOSIS — R918 Other nonspecific abnormal finding of lung field: Secondary | ICD-10-CM | POA: Diagnosis not present

## 2019-12-10 DIAGNOSIS — J95811 Postprocedural pneumothorax: Principal | ICD-10-CM

## 2019-12-10 DIAGNOSIS — Z808 Family history of malignant neoplasm of other organs or systems: Secondary | ICD-10-CM | POA: Diagnosis not present

## 2019-12-10 DIAGNOSIS — K219 Gastro-esophageal reflux disease without esophagitis: Secondary | ICD-10-CM | POA: Diagnosis present

## 2019-12-10 DIAGNOSIS — J939 Pneumothorax, unspecified: Secondary | ICD-10-CM

## 2019-12-10 DIAGNOSIS — E785 Hyperlipidemia, unspecified: Secondary | ICD-10-CM | POA: Diagnosis present

## 2019-12-10 DIAGNOSIS — J9383 Other pneumothorax: Secondary | ICD-10-CM | POA: Diagnosis not present

## 2019-12-10 DIAGNOSIS — Z20822 Contact with and (suspected) exposure to covid-19: Secondary | ICD-10-CM | POA: Diagnosis present

## 2019-12-10 DIAGNOSIS — J9811 Atelectasis: Secondary | ICD-10-CM | POA: Diagnosis not present

## 2019-12-10 DIAGNOSIS — Z791 Long term (current) use of non-steroidal anti-inflammatories (NSAID): Secondary | ICD-10-CM | POA: Diagnosis not present

## 2019-12-10 DIAGNOSIS — E876 Hypokalemia: Secondary | ICD-10-CM | POA: Diagnosis present

## 2019-12-10 DIAGNOSIS — R911 Solitary pulmonary nodule: Secondary | ICD-10-CM | POA: Diagnosis not present

## 2019-12-10 LAB — COMPREHENSIVE METABOLIC PANEL
ALT: 15 U/L (ref 0–44)
AST: 25 U/L (ref 15–41)
Albumin: 3.8 g/dL (ref 3.5–5.0)
Alkaline Phosphatase: 89 U/L (ref 38–126)
Anion gap: 11 (ref 5–15)
BUN: 5 mg/dL — ABNORMAL LOW (ref 8–23)
CO2: 26 mmol/L (ref 22–32)
Calcium: 9 mg/dL (ref 8.9–10.3)
Chloride: 102 mmol/L (ref 98–111)
Creatinine, Ser: 0.82 mg/dL (ref 0.44–1.00)
GFR, Estimated: >60 mL/min (ref >60)
Glucose, Bld: 98 mg/dL (ref 70–99)
Potassium: 3.2 mmol/L — ABNORMAL LOW (ref 3.5–5.1)
Sodium: 139 mmol/L (ref 135–145)
Total Bilirubin: 0.5 mg/dL (ref 0.3–1.2)
Total Protein: 7.3 g/dL (ref 6.5–8.1)

## 2019-12-10 LAB — CBC
HCT: 37.5 % (ref 36.0–46.0)
Hemoglobin: 12 g/dL (ref 12.0–15.0)
MCH: 26.3 pg (ref 26.0–34.0)
MCHC: 32 g/dL (ref 30.0–36.0)
MCV: 82.1 fL (ref 80.0–100.0)
Platelets: 452 10*3/uL — ABNORMAL HIGH (ref 150–400)
RBC: 4.57 MIL/uL (ref 3.87–5.11)
RDW: 15 % (ref 11.5–15.5)
WBC: 10.3 10*3/uL (ref 4.0–10.5)
nRBC: 0 % (ref 0.0–0.2)

## 2019-12-10 LAB — RESP PANEL BY RT-PCR (FLU A&B, COVID) ARPGX2
Influenza A by PCR: NEGATIVE
Influenza B by PCR: NEGATIVE
SARS Coronavirus 2 by RT PCR: NEGATIVE

## 2019-12-10 MED ORDER — AMLODIPINE BESYLATE 5 MG PO TABS
5.0000 mg | ORAL_TABLET | Freq: Every day | ORAL | Status: DC
  Administered 2019-12-11 – 2019-12-12 (×2): 5 mg via ORAL
  Filled 2019-12-10 (×2): qty 1

## 2019-12-10 MED ORDER — ONDANSETRON HCL 4 MG/2ML IJ SOLN: 4.0000 mg | Freq: Four times a day (QID) | INTRAMUSCULAR | Status: DC | PRN

## 2019-12-10 MED ORDER — POVIDONE-IODINE 10 % EX SOLN
CUTANEOUS | Status: AC
  Filled 2019-12-10: qty 15

## 2019-12-10 MED ORDER — OXYCODONE HCL 5 MG PO TABS
5.0000 mg | ORAL_TABLET | ORAL | Status: DC | PRN
  Administered 2019-12-10 – 2019-12-11 (×4): 5 mg via ORAL
  Filled 2019-12-10 (×4): qty 1

## 2019-12-10 MED ORDER — MORPHINE SULFATE (PF) 2 MG/ML IV SOLN
2.0000 mg | INTRAVENOUS | Status: DC | PRN
  Administered 2019-12-10 – 2019-12-11 (×5): 2 mg via INTRAVENOUS
  Filled 2019-12-10 (×5): qty 1

## 2019-12-10 MED ORDER — FENTANYL CITRATE (PF) 100 MCG/2ML IJ SOLN
50.0000 ug | Freq: Once | INTRAMUSCULAR | Status: AC
  Administered 2019-12-10: 50 ug via INTRAVENOUS

## 2019-12-10 MED ORDER — LEVOTHYROXINE SODIUM 75 MCG PO TABS
75.0000 ug | ORAL_TABLET | ORAL | Status: DC
  Administered 2019-12-11: 75 ug via ORAL
  Filled 2019-12-10 (×2): qty 1

## 2019-12-10 MED ORDER — ATORVASTATIN CALCIUM 10 MG PO TABS
10.0000 mg | ORAL_TABLET | Freq: Every day | ORAL | Status: DC
  Administered 2019-12-11 – 2019-12-12 (×2): 10 mg via ORAL
  Filled 2019-12-10 (×2): qty 1

## 2019-12-10 MED ORDER — LEVOTHYROXINE SODIUM 25 MCG PO TABS
37.5000 ug | ORAL_TABLET | ORAL | Status: DC
  Administered 2019-12-12: 37.5 ug via ORAL
  Filled 2019-12-10: qty 2

## 2019-12-10 MED ORDER — ACETAMINOPHEN 325 MG PO TABS
650.0000 mg | ORAL_TABLET | Freq: Four times a day (QID) | ORAL | Status: DC | PRN
  Administered 2019-12-10: 650 mg via ORAL
  Filled 2019-12-10: qty 2

## 2019-12-10 MED ORDER — ONDANSETRON HCL 4 MG PO TABS: 4.0000 mg | ORAL_TABLET | Freq: Four times a day (QID) | ORAL | Status: DC | PRN

## 2019-12-10 MED ORDER — HEPARIN SODIUM (PORCINE) 5000 UNIT/ML IJ SOLN
5000.0000 [IU] | Freq: Three times a day (TID) | INTRAMUSCULAR | Status: DC
  Administered 2019-12-10 – 2019-12-12 (×6): 5000 [IU] via SUBCUTANEOUS
  Filled 2019-12-10 (×6): qty 1

## 2019-12-10 MED ORDER — ALBUTEROL SULFATE (2.5 MG/3ML) 0.083% IN NEBU: 2.5000 mg | INHALATION_SOLUTION | Freq: Four times a day (QID) | RESPIRATORY_TRACT | Status: DC | PRN

## 2019-12-10 MED ORDER — LIDOCAINE-EPINEPHRINE (PF) 1 %-1:200000 IJ SOLN
10.0000 mL | Freq: Once | INTRAMUSCULAR | Status: AC
  Administered 2019-12-10: 10 mL
  Filled 2019-12-10: qty 30

## 2019-12-10 MED ORDER — ALBUTEROL SULFATE 108 (90 BASE) MCG/ACT IN AEPB: 2.0000 | INHALATION_SPRAY | Freq: Four times a day (QID) | RESPIRATORY_TRACT | Status: DC | PRN

## 2019-12-10 MED ORDER — DULOXETINE HCL 60 MG PO CPEP
60.0000 mg | ORAL_CAPSULE | Freq: Every day | ORAL | Status: DC
  Administered 2019-12-11 – 2019-12-12 (×2): 60 mg via ORAL
  Filled 2019-12-10 (×2): qty 1

## 2019-12-10 MED ORDER — HYDROXYZINE HCL 25 MG PO TABS: 25.0000 mg | ORAL_TABLET | Freq: Three times a day (TID) | ORAL | Status: DC | PRN

## 2019-12-10 MED ORDER — MONTELUKAST SODIUM 10 MG PO TABS
10.0000 mg | ORAL_TABLET | Freq: Every day | ORAL | Status: DC
  Administered 2019-12-11 (×2): 10 mg via ORAL
  Filled 2019-12-10 (×2): qty 1

## 2019-12-10 MED ORDER — FENTANYL CITRATE (PF) 100 MCG/2ML IJ SOLN: 50.0000 ug | Freq: Once | INTRAMUSCULAR | Status: AC

## 2019-12-10 MED ORDER — POLYETHYLENE GLYCOL 3350 17 G PO PACK: 17.0000 g | PACK | Freq: Every day | ORAL | Status: DC | PRN

## 2019-12-10 MED ORDER — FENTANYL CITRATE (PF) 100 MCG/2ML IJ SOLN
INTRAMUSCULAR | Status: AC
  Administered 2019-12-10: 50 ug via INTRAVENOUS
  Filled 2019-12-10: qty 2

## 2019-12-10 MED ORDER — HYDROXYZINE HCL 25 MG PO TABS
25.0000 mg | ORAL_TABLET | Freq: Three times a day (TID) | ORAL | Status: DC | PRN
  Administered 2019-12-11 (×2): 25 mg via ORAL
  Filled 2019-12-10 (×2): qty 1

## 2019-12-10 MED ORDER — LEVOTHYROXINE SODIUM 75 MCG PO TABS
37.5000 ug | ORAL_TABLET | ORAL | Status: DC
Start: 2019-12-10 — End: 2019-12-10

## 2019-12-10 MED ORDER — ACETAMINOPHEN 650 MG RE SUPP: 650.0000 mg | Freq: Four times a day (QID) | RECTAL | Status: DC | PRN

## 2019-12-10 MED ORDER — POTASSIUM CHLORIDE 10 MEQ/100ML IV SOLN
10.0000 meq | INTRAVENOUS | Status: AC
  Administered 2019-12-10 (×3): 10 meq via INTRAVENOUS
  Filled 2019-12-10 (×3): qty 100

## 2019-12-10 MED ORDER — PANTOPRAZOLE SODIUM 40 MG PO TBEC
40.0000 mg | DELAYED_RELEASE_TABLET | Freq: Every day | ORAL | Status: DC
  Administered 2019-12-11 – 2019-12-12 (×2): 40 mg via ORAL
  Filled 2019-12-10 (×2): qty 1

## 2019-12-10 NOTE — H&P (Signed)
TRH H&P    Patient Demographics:    Kelly Douglas, is a 64 y.o. female  MRN: [id number]  DOB - 09/21/1955  Admit Date - 12/10/2019  Referring MD/NP/PA: Sabra Heck  Outpatient Primary MD for the patient is [name], MD  Patient coming from: Home  Chief complaint- dyspnea   HPI:    Fatoumata Albaugh  is a 64 y.o. female, with history of scleroderma, hypothyroid, hypertension, hyperlipidemia, GERD, ASCVD, anxiety, and more presents to the ER with a chief complaint of dyspnea.  Patient has been undergoing a lung cancer work-up in the ambulatory setting and had a bronchial approach biopsy of a lung nodule on November 4th.  Shortly after that she started to feel chest tightness and shortness of breath.  Patient has an albuterol inhaler that she started using 2-3 times a day.  It offered temporary relief of her symptoms for about 1/2-hour, and then her symptoms would recur.  She then started having a productive cough with sputum and blood.  She then went in for a PET scan yesterday, and today in the office called her and told her to come into the ER for pneumothorax.  Patient denies any chest pain up until her chest tube was placed.  She does report that laying on her left side and coughing caused her to have left-sided chest wall pain, but she did not think much of it.  She reports that the pain was mild to moderate.  She describes it as sharp.  It would go away when she stopped coughing or when she repositioned herself in bed.  Patient is a current smoker smoking between half a pack and a pack per day.  She has been trying to wean herself down from 1 pack a day.  She drinks 1 beer several nights a week.  She denies illicit drug use.  Patient is full code.  In the ED temperature 97.6, heart rate 114, respiratory rate 18, blood pressure 118/89 sitting and 100% on 2 L nasal cannula No leukocytosis with a white blood  cell count 10.3, there is a thrombocytosis at 452. Chemistry panel reveals hypokalemia at 3.2 and is otherwise unremarkable. Chest x-ray shows moderate to large left pneumothorax Chest tube was placed Cardiothoracic surgery agreed with chest tube and requested patient be transferred to [facility] so they can consult in person     Review of systems:    In addition to the HPI above,  No Fever-chills, No Headache, No changes with Vision or hearing, No problems swallowing food or Liquids, No Chest pain, admits to cough with hemoptysis, and shortness of Breath, No Abdominal pain, No Nausea or Vomiting, bowel movements are regular, No Blood in stool or Urine, No dysuria, No new skin rashes or bruises, No new joints pains-aches,  No new weakness, tingling, numbness in any extremity, No recent weight gain or loss, No polyuria, polydypsia or polyphagia, Significant for mental stressors including cancer work-up  All other systems reviewed and are negative.    Past History of the following :  Past Medical History:  Diagnosis Date  . Anxiety   . Arteriosclerotic cardiovascular disease (ASCVD) 2013   coronary calcification-left main, LAD and CX; small pericardial effusion  . Arthritis    hips  . Chronic back pain   . Chronic bronchitis   . Chronic lung disease    Chronic scarring and volume loss-left lung; characteristics of a chronic infectious process-possible MAI  . Depression   . Dyspnea    occasional   . GERD (gastroesophageal reflux disease)    Dr Gala Romney EGD 09/2009->esophagitis, sm HH, antral erosions, atonic esophagus  . Hyperlipidemia   . Hypertension   . Hypothyroid 1981 approx  . Scleroderma ([place])   . Seasonal allergies   . Syncope    Multiple spells over the past 40+ years, likely neurocardiogenic  . Tobacco abuse 06/24/2009      Past Surgical History:  Procedure Laterality Date  . BILATERAL SALPINGOOPHORECTOMY  2013    Dr. Elonda Husky; uterus remains in situ  .  BRONCHIAL BIOPSY  11/21/2019   Procedure: BRONCHIAL BIOPSIES;  Surgeon: Collene Gobble, MD;  Location: [facility] ENDOSCOPY;  Service: Pulmonary;;  . BRONCHIAL BRUSHINGS  11/21/2019   Procedure: BRONCHIAL BRUSHINGS;  Surgeon: Collene Gobble, MD;  Location: [facility] ENDOSCOPY;  Service: Pulmonary;;  . BRONCHIAL NEEDLE ASPIRATION BIOPSY  11/21/2019   Procedure: BRONCHIAL NEEDLE ASPIRATION BIOPSIES;  Surgeon: Collene Gobble, MD;  Location: [place];  Service: Pulmonary;;  . BRONCHIAL WASHINGS  11/21/2019   Procedure: BRONCHIAL WASHINGS;  Surgeon: Collene Gobble, MD;  Location: [facility] ENDOSCOPY;  Service: Pulmonary;;  . COLONOSCOPY  09/28/09   anal papilla otherwise normal  . COLONOSCOPY N/A 08/29/2012   OXB:[id number] polyp-removed as described above. tubular adenoma  . COLONOSCOPY WITH PROPOFOL N/A 11/11/2019   Procedure: COLONOSCOPY WITH PROPOFOL;  Surgeon: Daneil Dolin, MD;  [facility] ENDO [facility];  Service: Endoscopy;  Laterality: N/A;  2:30pm  . ESOPHAGOGASTRODUODENOSCOPY  10/07/09   Dr. Barrie Dunker of esophageal mucosa, diffusely ?esophagitis (bx benign), small HH/antral erosions, erythema bx benign. 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 ESOPHAGOGASTRODUODENOSCOPY (EGD) WITH ESOPHAGEAL DILATION N/A 03/07/2012   EQA:[id number], patent, tubular esophagus of uncertain significance-status post biopsy )unremarkable). Hiatal hernia  . FUDUCIAL PLACEMENT  10/18/2017   Procedure: PLACEMENT OF 3 FUDUCIAL INTO LEFT LOWER LOBE OF LUNG-TARGET 1;  Surgeon: Collene Gobble, MD;  Location: [place];  Service: Thoracic;;  . LAPAROSCOPIC APPENDECTOMY  07/16/2011   Procedure: APPENDECTOMY LAPAROSCOPIC;  Surgeon: Donato Heinz, MD;  Location: [facility] ORS;  Service: General;  Laterality: N/A;  . POLYPECTOMY  11/11/2019   Procedure: POLYPECTOMY;  Surgeon: Daneil Dolin, MD;  Location: [facility] ENDO [facility];  Service: Endoscopy;;  . TUBAL LIGATION    . VIDEO BRONCHOSCOPY  10/18/2017  . VIDEO BRONCHOSCOPY WITH ENDOBRONCHIAL  NAVIGATION N/A 10/18/2017   Procedure: VIDEO BRONCHOSCOPY WITH ENDOBRONCHIAL NAVIGATION;  Surgeon: Collene Gobble, MD;  Location: [facility] OR;  Service: Thoracic;  Laterality: N/A;  . VIDEO BRONCHOSCOPY WITH ENDOBRONCHIAL NAVIGATION Left 11/21/2019   Procedure: VIDEO BRONCHOSCOPY WITH ENDOBRONCHIAL NAVIGATION;  Surgeon: Collene Gobble, MD;  Location: [place] ENDOSCOPY;  Service: Pulmonary;  Laterality: Left;      Social History:      Social History   Tobacco Use  . Smoking status: Current Every Day Smoker    Packs/day: 0.50    Years: 45.00    Pack years: 22.50    Types: Cigarettes    Start date: 07/14/1971  . Smokeless tobacco: Never Used  . Tobacco comment:  1 PPD current plans to reduce after surgery on 11/21/19  Substance Use Topics  . Alcohol use: Not Currently    Alcohol/week: 0.0 standard drinks    Comment: quit June 2012-used to drink 6 pack daily, occasional beer per pt 11/20/19       Family History :     Family History  Problem Relation Age of Onset  . Stroke Father   . Aneurysm Father   . Coronary artery disease Brother   . Hypertension Brother   . Down syndrome Brother   . Hypertension Brother   . Hypertension Brother        father/mother  . Aneurysm Mother   . Throat cancer Brother 49       throat cancer  . Hypertension Sister   . Diabetes Brother   . Diabetes Brother   . Anesthesia problems Neg Hx   . Hypotension Neg Hx   . Malignant hyperthermia Neg Hx   . Pseudochol deficiency Neg Hx   . Colon cancer Neg Hx       Home Medications:   Prior to Admission medications   Medication Sig Start Date End Date Taking? Authorizing Provider  acetaminophen (TYLENOL) 650 MG CR tablet Take 1,300 mg by mouth every 8 (eight) hours as needed for pain.    Yes [provider]  amLODipine (NORVASC) 5 MG tablet Take 1 tablet (5 mg total) by mouth daily. 04/17/19  Yes [name], MD  atorvastatin (LIPITOR) 10 MG tablet Take 1 tablet (10 mg total) by mouth daily.  10/16/19  Yes [name], MD  Cholecalciferol (VITAMIN D) 125 MCG (5000 UT) CAPS Take 5,000 Units by mouth daily.    Yes [provider]  DULoxetine (CYMBALTA) 60 MG capsule Take 1 capsule (60 mg total) by mouth daily. 11/21/19  Yes Collene Gobble, MD  hydrOXYzine (VISTARIL) 50 MG capsule Take one to two capsule by mouth at bedtime for sleep 11/14/19  Yes [name], MD  levothyroxine (SYNTHROID) 75 MCG tablet Take 0.5-1 tablets (37.5-75 mcg total) by mouth See admin instructions. Take 75 mcg by mouth every other day before breakfast, alternating with 37.5 mcg 11/21/19  Yes Byrum, Rose Fillers, MD  Lidocaine-Menthol (ICY HOT LIDOCAINE PLUS MENTHOL) 4-1 % PTCH Apply 1 patch topically daily as needed (pain).    Yes [provider]  Magnesium 500 MG CAPS Take 1 capsule by mouth daily.    Yes [provider]  meloxicam (MOBIC) 7.5 MG tablet Take 1 tablet (7.5 mg total) by mouth daily. 11/14/19  Yes [name], MD  montelukast (SINGULAIR) 10 MG tablet Take 1 tablet (10 mg total) by mouth at bedtime. 11/14/19  Yes [name], MD  pantoprazole (PROTONIX) 40 MG tablet TAKE 1 TABLET TWICE DAILY Patient taking differently: Take 40 mg by mouth 2 (two) times daily.  08/21/19  Yes [name], MD  potassium chloride (KLOR-CON 10) 10 MEQ tablet Take 1 tablet (10 mEq total) by mouth daily. 04/17/19  Yes [name], MD  PROAIR RESPICLICK [phone] Base) MCG/ACT AEPB Inhale 2 puffs into the lungs 4 (four) times daily as needed. 08/07/18  Yes Collene Gobble, MD  hydrOXYzine (ATARAX/VISTARIL) 25 MG tablet Take one tablet at bedtime for sleep by mouth Patient not taking: Reported on 12/10/2019 09/18/19   [name], MD     Allergies:     Allergies  Allergen Reactions  . Aspirin Other (See Comments)  Nose bleeds     Physical Exam:   Vitals  Blood pressure 129/82, pulse 88, temperature 97.6 F (36.4 C), temperature source Oral,  resp. rate (!) 22, height 5\' 8"  (1.727 m), weight 63 kg, SpO2 100 %.  1.  General: Patient lying supine in bed holding left rib cage secondary to pain from chest tube  2. Psychiatric: Mood and behavior normal for situation, alert and oriented x3, cooperative with exam  3. Neurologic: Cranial nerves II through XII are intact, moves all 4 extremities voluntarily, speech and language are normal, no focal deficit on limited exam  4. HEENMT:  Head is atraumatic, normocephalic, pupils reactive to light, neck is supple, trachea is midline, mucous membranes are moist, poor dentition  5. Respiratory : Are clear to auscultation bilaterally, however patient is resisting effort with deep inspiration secondary to pain, clubbing of fingernails, no cyanosis  6. Cardiovascular : Heart rate is tachycardic, rhythm is regular, no murmurs rubs or gallops, no peripheral edema  7. Gastrointestinal:  Abdomen is soft, nondistended, nontender to palpation  8. Skin:  Skin is dry, hypopigmentation on the dorsal aspect of both hands, no acute lesions  9.Musculoskeletal:  No calf tenderness, no acute deformity, no peripheral edema    Data Review:    CBC Recent Labs  Lab 12/10/19 1449  WBC 10.3  HGB 12.0  HCT 37.5  PLT 452*  MCV 82.1  MCH 26.3  MCHC 32.0  RDW 15.0   ------------------------------------------------------------------------------------------------------------------  Results for orders placed or performed during the hospital encounter of 12/10/19 (from the past 48 hour(s))  CBC     Status: Abnormal   Collection Time: 12/10/19  2:49 PM  Result Value Ref Range   WBC 10.3 4.0 - 10.5 K/uL   RBC 4.57 3.87 - 5.11 MIL/uL   Hemoglobin 12.0 12.0 - 15.0 g/dL   HCT 37.5 36 - 46 %   MCV 82.1 80.0 - 100.0 fL   MCH 26.3 26.0 - 34.0 pg   MCHC 32.0 30.0 - 36.0 g/dL   RDW 15.0 11.5 - 15.5 %   Platelets 452 (H) 150 - 400 K/uL   nRBC 0.0 0.0 - 0.2 %    Comment: Performed at [facility], [address] [place], [place] [address]  Comprehensive metabolic panel     Status: Abnormal   Collection Time: 12/10/19  2:49 PM  Result Value Ref Range   Sodium 139 135 - 145 mmol/L   Potassium 3.2 (L) 3.5 - 5.1 mmol/L   Chloride 102 98 - 111 mmol/L   CO2 26 22 - 32 mmol/L   Glucose, Bld 98 70 - 99 mg/dL    Comment: Glucose reference range applies only to samples taken after fasting for at least 8 hours.   BUN 5 (L) 8 - 23 mg/dL   Creatinine, Ser 0.82 0.44 - 1.00 mg/dL   Calcium 9.0 8.9 - 10.3 mg/dL   Total Protein 7.3 6.5 - 8.1 g/dL   Albumin 3.8 3.5 - 5.0 g/dL   AST 25 15 - 41 U/L   ALT 15 0 - 44 U/L   Alkaline Phosphatase 89 38 - 126 U/L   Total Bilirubin 0.5 0.3 - 1.2 mg/dL   GFR, Estimated >60 >60 mL/min    Comment: (NOTE) Calculated using the CKD-EPI Creatinine Equation (2021)    Anion gap 11 5 - 15    Comment: Performed at [facility], [address] [place], [place] [address]  Resp Panel by RT-PCR (Flu A&B,  Covid) Nasopharyngeal Swab     Status: None   Collection Time: 12/10/19  2:55 PM   Specimen: Nasopharyngeal Swab; Nasopharyngeal(NP) swabs in vial transport medium  Result Value Ref Range   SARS Coronavirus 2 by RT PCR NEGATIVE NEGATIVE    Comment: (NOTE) SARS-CoV-2 target nucleic acids are NOT DETECTED.  The SARS-CoV-2 RNA is generally detectable in upper respiratory specimens during the acute phase of infection. The lowest concentration of SARS-CoV-2 viral copies this assay can detect is 138 copies/mL. A negative result does not preclude SARS-Cov-2 infection and should not be used as the sole basis for treatment or other patient management decisions. A negative result may occur with  improper specimen collection/handling, submission of specimen other than nasopharyngeal swab, presence of viral mutation(s) within the areas targeted by this assay, and inadequate number of viral copies(<138 copies/mL). A negative result must be combined with clinical  observations, patient history, and epidemiological information. The expected result is Negative.  Fact Sheet for Patients:  EntrepreneurPulse.com.au  Fact Sheet for Healthcare Providers:  IncredibleEmployment.be  This test is no t yet approved or cleared by the [place] FDA and  has been authorized for detection and/or diagnosis of SARS-CoV-2 by FDA under an Emergency Use Authorization (EUA). This EUA will remain  in effect (meaning this test can be used) for the duration of the COVID-19 declaration under Section 564(b)(1) of the Act, 21 U.S.C.section 360bbb-3(b)(1), unless the authorization is terminated  or revoked sooner.       Influenza A by PCR NEGATIVE NEGATIVE   Influenza B by PCR NEGATIVE NEGATIVE    Comment: (NOTE) The Xpert Xpress SARS-CoV-2/FLU/RSV plus assay is intended as an aid in the diagnosis of influenza from Nasopharyngeal swab specimens and should not be used as a sole basis for treatment. Nasal washings and aspirates are unacceptable for Xpert Xpress SARS-CoV-2/FLU/RSV testing.  Fact Sheet for Patients: EntrepreneurPulse.com.au  Fact Sheet for Healthcare Providers: IncredibleEmployment.be  This test is not yet approved or cleared by the [place] FDA and has been authorized for detection and/or diagnosis of SARS-CoV-2 by FDA under an Emergency Use Authorization (EUA). This EUA will remain in effect (meaning this test can be used) for the duration of the COVID-19 declaration under Section 564(b)(1) of the Act, 21 U.S.C. section 360bbb-3(b)(1), unless the authorization is terminated or revoked.  Performed at [facility], [address] [place], [place] [address]     Chemistries  Recent Labs  Lab 12/10/19 1449  NA 139  K 3.2*  CL 102  CO2 26  GLUCOSE 98  BUN 5*  CREATININE 0.82  CALCIUM 9.0  AST 25  ALT 15  ALKPHOS 89  BILITOT 0.5    ------------------------------------------------------------------------------------------------------------------  ------------------------------------------------------------------------------------------------------------------ GFR: Estimated Creatinine Clearance: 69 mL/min (by C-G formula based on SCr of 0.82 mg/dL). Liver Function Tests: Recent Labs  Lab 12/10/19 1449  AST 25  ALT 15  ALKPHOS 89  BILITOT 0.5  PROT 7.3  ALBUMIN 3.8   No results for input(s): LIPASE, AMYLASE in the last 168 hours. No results for input(s): AMMONIA in the last 168 hours. Coagulation Profile: No results for input(s): INR, PROTIME in the last 168 hours. Cardiac Enzymes: No results for input(s): CKTOTAL, CKMB, CKMBINDEX, TROPONINI in the last 168 hours. BNP (last 3 results) No results for input(s): PROBNP in the last 8760 hours. HbA1C: No results for input(s): HGBA1C in the last 72 hours. CBG: No results for input(s): GLUCAP in the last 168 hours. Lipid Profile: No results for  input(s): CHOL, HDL, LDLCALC, TRIG, CHOLHDL, LDLDIRECT in the last 72 hours. Thyroid Function Tests: No results for input(s): TSH, T4TOTAL, FREET4, T3FREE, THYROIDAB in the last 72 hours. Anemia Panel: No results for input(s): VITAMINB12, FOLATE, FERRITIN, TIBC, IRON, RETICCTPCT in the last 72 hours.  --------------------------------------------------------------------------------------------------------------- Urine analysis:    Component Value Date/Time   COLORURINE YELLOW 03/29/2018 0145   APPEARANCEUR CLEAR 03/29/2018 0145   LABSPEC 1.006 03/29/2018 0145   PHURINE 6.0 03/29/2018 0145   GLUCOSEU NEGATIVE 03/29/2018 0145   HGBUR SMALL (A) 03/29/2018 0145   BILIRUBINUR NEGATIVE 03/29/2018 0145   BILIRUBINUR negative 12/19/2017 1549   KETONESUR 5 (A) 03/29/2018 0145   PROTEINUR NEGATIVE 03/29/2018 0145   UROBILINOGEN 2.0 (A) 12/19/2017 1549   UROBILINOGEN 0.2 12/15/2013 1335   NITRITE NEGATIVE 03/29/2018  0145   LEUKOCYTESUR NEGATIVE 03/29/2018 0145      Imaging Results:    NM PET Image Restage (PS) Skull Base to Thigh  Addendum Date: 12/10/2019   ADDENDUM REPORT: 12/10/2019 13:17 ADDENDUM: The original report was by Dr. Van Clines. The following addendum is by Dr. Van Clines: Critical Value/emergent results were called by telephone at the time of interpretation on 12/10/2019 at 1:02 pm to provider Dr. Baltazar Apo , who verbally acknowledged these results. Electronically Signed   By: Van Clines M.D.   On: 12/10/2019 13:17   Result Date: 12/10/2019 CLINICAL DATA:  Initial treatment strategy for non-small cell lung cancer. EXAM: NUCLEAR MEDICINE PET SKULL BASE TO THIGH TECHNIQUE: 10.2 mCi F-18 FDG was injected intravenously. Full-ring PET imaging was performed from the skull base to thigh after the radiotracer. CT data was obtained and used for attenuation correction and anatomic localization. Fasting blood glucose: 98 mg/dl COMPARISON:  Multiple exams, including CT chest 08/05/2019 and PET-CT from 10/02/2017 FINDINGS: Mediastinal blood pool activity: SUV max 2.7 Liver activity: SUV max NA NECK: No significant abnormal hypermetabolic activity in this region. Incidental CT findings: Bilateral common carotid atherosclerotic calcification. CHEST: Left-sided pneumothorax occupying about 50% of left hemithoracic activity. This causes some misregistration of the PET and CT data, but the lesion of concern is believed to have a maximum SUV of about 4.7, suspicious for malignancy. The scar-like banding at the left lung apex has a maximum SUV of about 2.8, and is more likely to be inflammatory. Incidental CT findings: Left anterior descending coronary artery atherosclerosis and aortic atherosclerosis. Fiducials noted in the left lung. There is some tethering of the left lung apex to the pleura due to scarring. ABDOMEN/PELVIS: No significant abnormal hypermetabolic activity in this region.  Incidental CT findings: Aortoiliac atherosclerotic vascular disease. SKELETON: No significant abnormal hypermetabolic activity in this region. Incidental CT findings: Grade 1 degenerative anterolisthesis at L4-5. Lumbar spondylosis and degenerative disc disease at L5-S1. IMPRESSION: 1. The 2 cm left lower lobe pulmonary nodule has sub solid elements has a maximum SUV of 4.7, suspicious for malignancy. No compelling findings of distant metastatic spread. 2. 50% left-sided pneumothorax.  No shift of mediastinal structures. 3.  Aortic Atherosclerosis (ICD10-I70.0).  Coronary atherosclerosis. 4. Lower lumbar spondylosis and degenerative disc disease. Radiology assistant personnel have been notified to put me in telephone contact with the referring physician or the referring physician's clinical representative in order to discuss these findings. Once this communication is established I will issue an addendum to this report for documentation purposes. Electronically Signed: By: Van Clines M.D. On: 12/10/2019 13:00   DG Chest Port 1 View  Result Date: 12/10/2019 CLINICAL DATA:  Follow-up pneumothorax EXAM:  PORTABLE CHEST 1 VIEW COMPARISON:  09/21/2019.  PET CT 12/09/2019 FINDINGS: Moderate to large left pneumothorax again noted. This is likely similar or slightly smaller than prior study. Right lung clear. Areas of atelectasis in the left base. Heart is normal size. No acute bony abnormality. IMPRESSION: Moderate to large left pneumothorax again noted, stable or minimally decreased since prior study. Left base atelectasis Critical Value/emergent results were called by telephone at the time of interpretation on 12/10/2019 at 3:22 pm to provider [facility] , who verbally acknowledged these results. Electronically Signed   By: Rolm Baptise M.D.   On: 12/10/2019 15:22   DG Chest Port 1V same Day  Result Date: 12/10/2019 CLINICAL DATA:  Chest tube placement EXAM: PORTABLE CHEST 1 VIEW COMPARISON:  Earlier  same day FINDINGS: Interval placement of left apically directed chest tube. Residual small apical pneumothorax. Volume loss in the left thorax. Nodular opacity of the lower left lung corresponding to hypermetabolic mass on PET-CT. Right lung is clear. Normal heart size. IMPRESSION: Interval chest tube placement with residual small left apical pneumothorax. Electronically Signed   By: Macy Mis M.D.   On: 12/10/2019 16:16      Assessment & Plan:    Principal Problem:   Pneumothorax on left Active Problems:   Hypothyroidism   HTN (hypertension)   Hypokalemia   Thrombocytosis   1. Acute moderate to large pneumothorax left 1. PET scan shows 50% pneumothorax 2. Continue oxygen via nasal cannula 3. Chest tube in place continue to monitor 4. Cardiothoracic surgery consulted 5. Transfer patient to [facility] 2. Hypothyroidism 1. Check TSH 2. Continue levothyroxine 3. Thrombocytosis 1. Platelet count 452, baseline seems to be 300s 2. Likely acute phase reaction 3. Trend in the a.m. 4. Hypokalemia 1. Replace and recheck 5. GERD 1. Continue Protonix 6. Hypertension 1. Continue amlodipine   DVT Prophylaxis-   heparin - SCDs   AM Labs Ordered, also please review Full Orders  Family Communication: No family at bedside.  Code Status:  FULL  Admission status: Inpatient :The appropriate admission status for this patient is INPATIENT. Inpatient status is judged to be reasonable and necessary in order to provide the required intensity of service to ensure the patient's safety. The patient's presenting symptoms, physical exam findings, and initial radiographic and laboratory data in the context of their chronic comorbidities is felt to place them at high risk for further clinical deterioration. Furthermore, it is not anticipated that the patient will be medically stable for discharge from the hospital within 2 midnights of admission. The following factors support the admission status of  inpatient.     The patient's presenting symptoms include dyspnea The worrisome physical exam findings include tachypnea, tachycardia The initial radiographic and laboratory data are worrisome because of mod-large pneumothorax  The chronic co-morbidities include Lung cancer, Scleroderma, Tobacco abuse, HTN, HLD, GERD,        * I certify that at the point of admission it is my clinical judgment that the patient will require inpatient hospital care spanning beyond 2 midnights from the point of admission due to high intensity of service, high risk for further deterioration and high frequency of surveillance required.*  Time spent in minutes : [place]

## 2019-12-10 NOTE — Telephone Encounter (Signed)
Reviewed PET scan with radiology - shows a new 50% L PTX without shift or evidence for tension. It will need to be drained w chest tube. I called her - no answer, left her a message to call us back, that it was urgent. I will try alternative numbers as well, keep trying to reach her.   I was able to reach her sister Linna Caprice who lives next door. We will work on getting her to the ED. She is going over to get the patient now so I can speak with her.

## 2019-12-10 NOTE — ED Notes (Signed)
Reported given to carelink

## 2019-12-10 NOTE — Telephone Encounter (Signed)
I was able to speak with Kelly Douglas and with Kelly Douglas.  I explained to the PET scan findings.  I recommended that she go to the emergency department at [facility], ASAP.  She is going to be driven by her niece.  I will call and notify the [facility] ED that Kelly Douglas is on the way

## 2019-12-10 NOTE — ED Notes (Signed)
Dr Sabra Heck at bedside for chest tube placement. Pt consents to chest tube placement. Time out done.

## 2019-12-10 NOTE — ED Notes (Signed)
Report to carelink.  

## 2019-12-10 NOTE — ED Provider Notes (Signed)
[facility] Provider Note   CSN: [id number] Arrival date & time: 12/10/19  1428     History Chief Complaint  Patient presents with  . Pneumothorax    DAWNN NAM is a 64 y.o. female.  HPI   This patient is a 64 year old female with a known history of chronic lung disease, she has acid reflux hypertension scleroderma and a history of neurocardiogenic syncope.  She was also diagnosed with recent lung cancer, she had a bronchial biopsy on November 4 of 2021.  At that time she had a left lower lobe nodule biopsy by bronchoscopy, she had a post biopsy chest x-ray which revealed no acute complicating findings.  Yesterday she went for a PET scan which revealed that she had a moderate to large sided left-sided pneumothorax.  This was noted to be 50% pneumothorax, there was no significant findings of aggressive or metastatic spread of cancer.  The patient incidentally has felt short of breath for about a week and a half, gradually worsening, she has no chest pain, no fever, no nausea or vomiting.  Past Medical History:  Diagnosis Date  . Anxiety   . Arteriosclerotic cardiovascular disease (ASCVD) 2013   coronary calcification-left main, LAD and CX; small pericardial effusion  . Arthritis    hips  . Chronic back pain   . Chronic bronchitis   . Chronic lung disease    Chronic scarring and volume loss-left lung; characteristics of a chronic infectious process-possible MAI  . Depression   . Dyspnea    occasional   . GERD (gastroesophageal reflux disease)    Dr Gala Romney EGD 09/2009->esophagitis, sm HH, antral erosions, atonic esophagus  . Hyperlipidemia   . Hypertension   . Hypothyroid 1981 approx  . Scleroderma ([place])   . Seasonal allergies   . Syncope    Multiple spells over the past 40+ years, likely neurocardiogenic  . Tobacco abuse 06/24/2009    Patient Active Problem List   Diagnosis Date Noted  . Hx of adenomatous colonic polyps 09/03/2019  . Cervical  lymphadenopathy 05/16/2019  . Hypomagnesemia 03/28/2018  . S/P bronchoscopy 10/18/2017  . Nodule of lower lobe of left lung 11/18/2016  . Pollen allergies 05/19/2015  . Nicotine dependence 09/17/2013  . Hoarseness of voice 09/16/2013  . Cervical pain (neck) 06/21/2013  . Back pain with radiation 06/21/2013  . IDA (iron deficiency anemia) 08/10/2012  . Essential hypertension 02/22/2012  . Insomnia 05/09/2011  . GERD (gastroesophageal reflux disease)   . Scleroderma ([place])   . Hypothyroid   . Arteriosclerotic cardiovascular disease (ASCVD)   . Syncope   . Anxiety and depression 06/24/2009  . Vitamin D deficiency 03/18/2009  . Hyperlipidemia 03/18/2009    Past Surgical History:  Procedure Laterality Date  . BILATERAL SALPINGOOPHORECTOMY  2013    Dr. Elonda Husky; uterus remains in situ  . BRONCHIAL BIOPSY  11/21/2019   Procedure: BRONCHIAL BIOPSIES;  Surgeon: Collene Gobble, MD;  Location: [facility] ENDOSCOPY;  Service: Pulmonary;;  . BRONCHIAL BRUSHINGS  11/21/2019   Procedure: BRONCHIAL BRUSHINGS;  Surgeon: Collene Gobble, MD;  Location: [facility] ENDOSCOPY;  Service: Pulmonary;;  . BRONCHIAL NEEDLE ASPIRATION BIOPSY  11/21/2019   Procedure: BRONCHIAL NEEDLE ASPIRATION BIOPSIES;  Surgeon: Collene Gobble, MD;  Location: [facility];  Service: Pulmonary;;  . BRONCHIAL WASHINGS  11/21/2019   Procedure: BRONCHIAL WASHINGS;  Surgeon: Collene Gobble, MD;  Location: [facility] ENDOSCOPY;  Service: Pulmonary;;  . COLONOSCOPY  09/28/09   anal papilla otherwise normal  .  COLONOSCOPY N/A 08/29/2012   MBT:DHRCBUL polyp-removed as described above. tubular adenoma  . COLONOSCOPY WITH PROPOFOL N/A 11/11/2019   Procedure: COLONOSCOPY WITH PROPOFOL;  Surgeon: Daneil Dolin, MD;  [facility]O [facility];  Service: Endoscopy;  Laterality: N/A;  2:30pm  . ESOPHAGOGASTRODUODENOSCOPY  10/07/09   Dr. Barrie Dunker of esophageal mucosa, diffusely ?esophagitis (bx benign), small HH/antral erosions, erythema bx benign. 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 ESOPHAGOGASTRODUODENOSCOPY (EGD) WITH ESOPHAGEAL DILATION N/A 03/07/2012   AGT:XMIWOEHO, patent, tubular esophagus of uncertain significance-status post biopsy )unremarkable). Hiatal hernia  . FUDUCIAL PLACEMENT  10/18/2017   Procedure: PLACEMENT OF 3 FUDUCIAL INTO LEFT LOWER LOBE OF LUNG-TARGET 1;  Surgeon: Collene Gobble, MD;  Location: [facility];  Service: Thoracic;;  . LAPAROSCOPIC APPENDECTOMY  07/16/2011   Procedure: APPENDECTOMY LAPAROSCOPIC;  Surgeon: Donato Heinz, MD;  Location: [facility] ORS;  Service: General;  Laterality: N/A;  . POLYPECTOMY  11/11/2019   Procedure: POLYPECTOMY;  Surgeon: Daneil Dolin, MD;  [facility] ENDO [facility];  Service: Endoscopy;;  . TUBAL LIGATION    . VIDEO BRONCHOSCOPY  10/18/2017  . VIDEO BRONCHOSCOPY WITH ENDOBRONCHIAL NAVIGATION N/A 10/18/2017   Procedure: VIDEO BRONCHOSCOPY WITH ENDOBRONCHIAL NAVIGATION;  Surgeon: Collene Gobble, MD;  Location: [facility] OR;  Service: Thoracic;  Laterality: N/A;  . VIDEO BRONCHOSCOPY WITH ENDOBRONCHIAL NAVIGATION Left 11/21/2019   Procedure: VIDEO BRONCHOSCOPY WITH ENDOBRONCHIAL NAVIGATION;  Surgeon: Collene Gobble, MD;  Location: [facility] ENDOSCOPY;  Service: Pulmonary;  Laterality: Left;     OB History    Gravida  2   Para  2   Term  2   Preterm      AB      Living  2     SAB      TAB      Ectopic      Multiple      Live Births              Family History  Problem Relation Age of Onset  . Stroke Father   . Aneurysm Father   . Coronary artery disease Brother   . Hypertension Brother   . Down syndrome Brother   . Hypertension Brother   . Hypertension Brother        father/mother  . Aneurysm Mother   . Throat cancer Brother 49       throat cancer  . Hypertension Sister   . Diabetes Brother   . Diabetes Brother   . Anesthesia problems Neg Hx   . Hypotension Neg Hx   . Malignant hyperthermia Neg Hx   . Pseudochol deficiency Neg Hx   . Colon cancer Neg Hx      Social History   Tobacco Use  . Smoking status: Current Every Day Smoker    Packs/day: 0.50    Years: 45.00    Pack years: 22.50    Types: Cigarettes    Start date: 07/14/1971  . Smokeless tobacco: Never Used  . Tobacco comment: 1 PPD current plans to reduce after surgery on 11/21/19  Vaping Use  . Vaping Use: Never used  Substance Use Topics  . Alcohol use: Not Currently    Alcohol/week: 0.0 standard drinks    Comment: quit June 2012-used to drink 6 pack daily, occasional beer per pt 11/20/19  . Drug use: No    Home Medications Prior to Admission medications   Medication Sig Start Date End Date Taking? Authorizing Provider  acetaminophen (TYLENOL) 650 MG CR tablet Take 1,300  mg by mouth every 8 (eight) hours as needed for pain.     [provider]  amLODipine (NORVASC) 5 MG tablet Take 1 tablet (5 mg total) by mouth daily. 04/17/19   [name], MD  atorvastatin (LIPITOR) 10 MG tablet Take 1 tablet (10 mg total) by mouth daily. 10/16/19   [name], MD  Cholecalciferol (VITAMIN D) 125 MCG (5000 UT) CAPS Take 5,000 Units by mouth daily.     [provider]  DULoxetine (CYMBALTA) 60 MG capsule Take 1 capsule (60 mg total) by mouth daily. 11/21/19   Collene Gobble, MD  hydrOXYzine (ATARAX/VISTARIL) 25 MG tablet Take one tablet at bedtime for sleep by mouth 09/18/19   [name], MD  hydrOXYzine (VISTARIL) 50 MG capsule Take one to two capsule by mouth at bedtime for sleep 11/14/19   [name], MD  levothyroxine (SYNTHROID) 75 MCG tablet Take 0.5-1 tablets (37.5-75 mcg total) by mouth See admin instructions. Take 75 mcg by mouth every other day before breakfast, alternating with 37.5 mcg 11/21/19   Collene Gobble, MD  Lidocaine-Menthol (ICY HOT LIDOCAINE PLUS MENTHOL) 4-1 % PTCH Apply 1 patch topically daily as needed (pain).     [provider]  Magnesium 250 MG TABS Take 250 mg by mouth daily.    [provider]   Magnesium 500 MG CAPS Take 1 capsule by mouth daily.     [provider]  meloxicam (MOBIC) 7.5 MG tablet Take 1 tablet (7.5 mg total) by mouth daily. 11/14/19   [name], MD  montelukast (SINGULAIR) 10 MG tablet Take 1 tablet (10 mg total) by mouth at bedtime. 11/14/19   [name], MD  pantoprazole (PROTONIX) 40 MG tablet TAKE 1 TABLET TWICE DAILY Patient taking differently: Take 40 mg by mouth 2 (two) times daily.  08/21/19   [name], MD  potassium chloride (KLOR-CON 10) 10 MEQ tablet Take 1 tablet (10 mEq total) by mouth daily. 04/17/19   [name], MD  PROAIR RESPICLICK [phone] Base) MCG/ACT AEPB Inhale 2 puffs into the lungs 4 (four) times daily as needed. 08/07/18   Collene Gobble, MD    Allergies    Aspirin  Review of Systems   Review of Systems  All other systems reviewed and are negative.   Physical Exam Updated Vital Signs BP 118/89   Pulse (!) 114   Temp 97.6 F (36.4 C) (Oral)   Resp 18   Ht 1.727 m ($Remove'5\' 8"'bgnRtzv$ )   Wt 63 kg   LMP  (LMP Unknown)   SpO2 97%   BMI 21.13 kg/m   Physical Exam Vitals and nursing note reviewed.  Constitutional:      General: She is not in acute distress.    Appearance: She is well-developed.  HENT:     Head: Normocephalic and atraumatic.     Mouth/Throat:     Pharynx: No oropharyngeal exudate.  Eyes:     General: No scleral icterus.       Right eye: No discharge.        Left eye: No discharge.     Conjunctiva/sclera: Conjunctivae normal.     Pupils: Pupils are equal, round, and reactive to light.  Neck:     Thyroid: No thyromegaly.     Vascular: No JVD.  Cardiovascular:     Rate and Rhythm: Regular rhythm. Tachycardia present.     Heart sounds: Normal heart sounds. No murmur  heard.  No friction rub. No gallop.   Pulmonary:     Effort: No respiratory distress.     Breath sounds: No wheezing or rales.     Comments: Decreased BS on the L, mild tachypnea Abdominal:     General:  Bowel sounds are normal. There is no distension.     Palpations: Abdomen is soft. There is no mass.     Tenderness: There is no abdominal tenderness.  Musculoskeletal:        General: No tenderness. Normal range of motion.     Cervical back: Normal range of motion and neck supple.  Lymphadenopathy:     Cervical: No cervical adenopathy.  Skin:    General: Skin is warm and dry.     Findings: No erythema or rash.  Neurological:     Mental Status: She is alert.     Coordination: Coordination normal.  Psychiatric:        Behavior: Behavior normal.     ED Results / Procedures / Treatments   Labs (all labs ordered are listed, but only abnormal results are displayed) Labs Reviewed  CBC - Abnormal; Notable for the following components:      Result Value   Platelets 452 (*)    All other components within normal limits  RESP PANEL BY RT-PCR (FLU A&B, COVID) [id number]  COMPREHENSIVE METABOLIC PANEL    EKG None  Radiology NM PET Image Restage (PS) Skull Base to Thigh  Addendum Date: 12/10/2019   ADDENDUM REPORT: 12/10/2019 13:17 ADDENDUM: The original report was by Dr. Van Clines. The following addendum is by Dr. Van Clines: Critical Value/emergent results were called by telephone at the time of interpretation on 12/10/2019 at 1:02 pm to provider Dr. Baltazar Apo , who verbally acknowledged these results. Electronically Signed   By: Van Clines M.D.   On: 12/10/2019 13:17   Result Date: 12/10/2019 CLINICAL DATA:  Initial treatment strategy for non-small cell lung cancer. EXAM: NUCLEAR MEDICINE PET SKULL BASE TO THIGH TECHNIQUE: 10.2 mCi F-18 FDG was injected intravenously. Full-ring PET imaging was performed from the skull base to thigh after the radiotracer. CT data was obtained and used for attenuation correction and anatomic localization. Fasting blood glucose: 98 mg/dl COMPARISON:  Multiple exams, including CT chest 08/05/2019 and PET-CT from 10/02/2017 FINDINGS:  Mediastinal blood pool activity: SUV max 2.7 Liver activity: SUV max NA NECK: No significant abnormal hypermetabolic activity in this region. Incidental CT findings: Bilateral common carotid atherosclerotic calcification. CHEST: Left-sided pneumothorax occupying about 50% of left hemithoracic activity. This causes some misregistration of the PET and CT data, but the lesion of concern is believed to have a maximum SUV of about 4.7, suspicious for malignancy. The scar-like banding at the left lung apex has a maximum SUV of about 2.8, and is more likely to be inflammatory. Incidental CT findings: Left anterior descending coronary artery atherosclerosis and aortic atherosclerosis. Fiducials noted in the left lung. There is some tethering of the left lung apex to the pleura due to scarring. ABDOMEN/PELVIS: No significant abnormal hypermetabolic activity in this region. Incidental CT findings: Aortoiliac atherosclerotic vascular disease. SKELETON: No significant abnormal hypermetabolic activity in this region. Incidental CT findings: Grade 1 degenerative anterolisthesis at L4-5. Lumbar spondylosis and degenerative disc disease at L5-S1. IMPRESSION: 1. The 2 cm left lower lobe pulmonary nodule has sub solid elements has a maximum SUV of 4.7, suspicious for malignancy. No compelling findings of distant metastatic spread. 2. 50% left-sided pneumothorax.  No shift of mediastinal  structures. 3.  Aortic Atherosclerosis (ICD10-I70.0).  Coronary atherosclerosis. 4. Lower lumbar spondylosis and degenerative disc disease. Radiology assistant personnel have been notified to put me in telephone contact with the referring physician or the referring physician's clinical representative in order to discuss these findings. Once this communication is established I will issue an addendum to this report for documentation purposes. Electronically Signed: By: Van Clines M.D. On: 12/10/2019 13:00   DG Chest Port 1 View  Result Date:  12/10/2019 CLINICAL DATA:  Follow-up pneumothorax EXAM: PORTABLE CHEST 1 VIEW COMPARISON:  09/21/2019.  PET CT 12/09/2019 FINDINGS: Moderate to large left pneumothorax again noted. This is likely similar or slightly smaller than prior study. Right lung clear. Areas of atelectasis in the left base. Heart is normal size. No acute bony abnormality. IMPRESSION: Moderate to large left pneumothorax again noted, stable or minimally decreased since prior study. Left base atelectasis Critical Value/emergent results were called by telephone at the time of interpretation on 12/10/2019 at 3:22 pm to provider [facility] , who verbally acknowledged these results. Electronically Signed   By: Rolm Baptise M.D.   On: 12/10/2019 15:22    Procedures CHEST TUBE INSERTION  Date/Time: 12/10/2019 3:55 PM Performed by: Noemi Chapel, MD Authorized by: Noemi Chapel, MD   Consent:    Consent obtained:  Written   Consent given by:  Patient   Risks discussed:  Bleeding, incomplete drainage, nerve damage, damage to surrounding structures, infection and pain   Alternatives discussed:  No treatment and alternative treatment Pre-procedure details:    Skin preparation:  ChloraPrep   Preparation: Patient was prepped and draped in the usual sterile fashion   Anesthesia (see MAR for exact dosages):    Anesthesia method:  Local infiltration   Local anesthetic:  Lidocaine 1% WITH epi Procedure details:    Placement location:  L lateral   Scalpel size: Seldinger Technique.   Tube size [place]): Wayne Catheter kit.   Ultrasound guidance: no     Tension pneumothorax: no     Tube connected to:  Water seal   Drainage characteristics:  Air only   Suture material:  2-0 silk   Dressing:  4x4 sterile gauze Post-procedure details:    Post-insertion x-ray findings: tube in good position   Comments:        .Critical Care Performed by: Noemi Chapel, MD Authorized by: Noemi Chapel, MD   Critical care provider statement:     Critical care time (minutes):  35   Critical care time was exclusive of:  Separately billable procedures and treating other patients and teaching time   Critical care was necessary to treat or prevent imminent or life-threatening deterioration of the following conditions: large pneumothroax.   Critical care was time spent personally by me on the following activities:  Blood draw for specimens, development of treatment plan with patient or surrogate, discussions with consultants, evaluation of patient's response to treatment, examination of patient, obtaining history from patient or surrogate, ordering and performing treatments and interventions, ordering and review of laboratory studies, ordering and review of radiographic studies, pulse oximetry, re-evaluation of patient's condition and review of old charts   (including critical care time)  Medications Ordered in ED Medications  lidocaine-EPINEPHrine (XYLOCAINE-EPINEPHrine) 1 %-1:200000 (PF) injection 10 mL (has no administration in time range)  povidone-iodine (BETADINE) 10 % external solution (has no administration in time range)  povidone-iodine (BETADINE) 10 % external solution (has no administration in time range)  fentaNYL (SUBLIMAZE) injection 50 mcg (has no administration  in time range)  fentaNYL (SUBLIMAZE) 100 MCG/2ML injection (50 mcg Intravenous Given 12/10/19 1530)  fentaNYL (SUBLIMAZE) injection 50 mcg (50 mcg Intravenous Given 12/10/19 1600)    ED Course  I have reviewed the triage vital signs and the nursing notes.  Pertinent labs & imaging results that were available during my care of the patient were reviewed by me and considered in my medical decision making (see chart for details).    MDM Rules/Calculators/A&P                          This patient does appear ill with a severe condition including his large sized pneumothorax.  I performed a chest x-ray to confirm the pneumothorax, it does not fact show large  pneumothorax.  I discussed the care with the cardiothoracic surgeon, Dr. Kipp Brood, he will indeed be a consultant on the case and requested the hospitalist admit.  He is okay with proceeding with chest tube, please see separate notes.  This patient is critically ill with a severe pneumothorax, will need admission to the hospital.  After chest tube placement the patient appears stable, post chest tube x-ray ordered.  D/w hosptialist who will admit. - transfer to [facility]  Final Clinical Impression(s) / ED Diagnoses Final diagnoses:  Malignant neoplasm of lower lobe of left lung ([place])  Pneumothorax after biopsy      Noemi Chapel, MD 12/10/19 1603

## 2019-12-10 NOTE — ED Triage Notes (Signed)
Pt sent here for left sided pneumothorax that was found on PET scan done last night.

## 2019-12-11 ENCOUNTER — Inpatient Hospital Stay (HOSPITAL_COMMUNITY): Payer: Medicare HMO

## 2019-12-11 ENCOUNTER — Ambulatory Visit (HOSPITAL_COMMUNITY): Payer: Medicare HMO | Admitting: Hematology

## 2019-12-11 DIAGNOSIS — J939 Pneumothorax, unspecified: Secondary | ICD-10-CM | POA: Diagnosis not present

## 2019-12-11 LAB — HIV ANTIBODY (ROUTINE TESTING W REFLEX): HIV Screen 4th Generation wRfx: NONREACTIVE

## 2019-12-11 NOTE — Progress Notes (Signed)
Chest tube removed per order. Vital signs taken and charted. Patient tolerated well, will continue to monitor.   Donnelly Angelica, RN

## 2019-12-11 NOTE — Progress Notes (Signed)
PROGRESS NOTE  Kelly Douglas ZOX:[id number] DOB: 03/02/1955 DOA: 12/10/2019 PCP: [name], MD  Brief History   Kelly Douglas  is a 64 y.o. female, with history of scleroderma, hypothyroid, hypertension, hyperlipidemia, GERD, ASCVD, anxiety, and more presents to the ER with a chief complaint of dyspnea.  Patient has been undergoing a lung cancer work-up in the ambulatory setting and had a bronchial approach biopsy of a lung nodule on November 4th.  Shortly after that she started to feel chest tightness and shortness of breath.  Patient has an albuterol inhaler that she started using 2-3 times a day.  It offered temporary relief of her symptoms for about 1/2-hour, and then her symptoms would recur.  She then started having a productive cough with sputum and blood.  She then went in for a PET scan yesterday, and today in the office called her and told her to come into the ER for pneumothorax.  Patient denies any chest pain up until her chest tube was placed.  She does report that laying on her left side and coughing caused her to have left-sided chest wall pain, but she did not think much of it.  She reports that the pain was mild to moderate.  She describes it as sharp.  It would go away when she stopped coughing or when she repositioned herself in bed.  Patient is a current smoker smoking between half a pack and a pack per day.  She has been trying to wean herself down from 1 pack a day.  She drinks 1 beer several nights a week.  She denies illicit drug use.  Patient is full code.  In the ED temperature 97.6, heart rate 114, respiratory rate 18, blood pressure 118/89 sitting and 100% on 2 L nasal cannula No leukocytosis with a white blood cell count 10.3, there is a thrombocytosis at 452. Chemistry panel reveals hypokalemia at 3.2 and is otherwise unremarkable. Chest x-ray shows moderate to large left pneumothorax Chest tube was placed Cardiothoracic surgery agreed with chest tube and  requested patient be transferred to [facility] so they can consult in person  The patient was received by [facility] at [facility]. CTS was consulted. They evaluated the patient today and determined that the CT could be removed. They state that the patient can be discharged if she remains stable in the morning.  Consultants  . CTS  Procedures  . Chest tube placement and removal.  Antibiotics   Anti-infectives (From admission, onward)   None    .  Subjective  The patient is resting comfortably. She is complaining of severe pain in left chest wall where chest tube is.  Objective   Vitals:  Vitals:   12/11/19 1140 12/11/19 1540  BP: 112/69 111/80  Pulse: 81 83  Resp: 19 15  Temp: 98.3 F (36.8 C) 98.2 F (36.8 C)  SpO2: 95% 93%   Exam:  Constitutional:  . The patient is awake, alert, and oriented x 3. No acute distress. Respiratory:  . No increased work of breathing. . No wheezes, rales, or rhonchi . No tactile fremitus Cardiovascular:  . Regular rate and rhythm . No murmurs, ectopy, or gallups. . No lateral PMI. No thrills. Abdomen:  . Abdomen is soft, non-tender, non-distended . No hernias, masses, or organomegaly . Normoactive bowel sounds.  Musculoskeletal:  . No cyanosis, clubbing, or edema Skin:  . No rashes, lesions, ulcers . palpation of skin: no induration or nodules Neurologic:  . CN 2-12 intact .  Sensation all 4 extremities intact Psychiatric:  . Mental status o Mood, affect appropriate o Orientation to person, place, time  . judgment and insight appear intact  I have personally reviewed the following:   Today's Data  . Vitals, CMP, CBC  Imaging  CXR  Scheduled Meds: . amLODipine  5 mg Oral Daily  . atorvastatin  10 mg Oral Daily  . DULoxetine  60 mg Oral Daily  . heparin  5,000 Units Subcutaneous Q8H  . [START ON 12/12/2019] levothyroxine  37.5 mcg Oral Q48H  . levothyroxine  75 mcg Oral Q48H  . montelukast  10 mg Oral QHS  .  pantoprazole  40 mg Oral Daily   Continuous Infusions:  Principal Problem:   Pneumothorax on left Active Problems:   Hypothyroidism   HTN (hypertension)   Hypokalemia   Thrombocytosis   LOS: 1 day    A & P  Acute moderate to large pneumothorax left: Chest tube placed at [facility] and patient transferred here for CTS consult. CTS has evaluated the patient and placed an order for the CT to be removed. He has cleared her for discharge tomorrow, if she remains stable.  Hypothyroidism: Continue synthroid as at home.  Thrombocytosis: Platelet count 452, baseline seems to be 300s. This is likely acute phase reaction. Monitor.  Hypokalemia: Resolved. Monitor.  GERD: Continue Protonix  Hypertension: Stable on amlodipine  I have seen and examined this patient myself. I have spent 34 minutes in her evaluation and care.  DVT Prophylaxis:  SQ Heparin Family Communication: No family at bedside.  Code Status:  FULL CODE Status is: Inpatient  Remains inpatient appropriate because:Inpatient level of care appropriate due to severity of illness   Dispo: The patient is from: Home              Anticipated d/c is to: Home              Anticipated d/c date is: 1 day              Patient currently is not medically stable to d/c.  Kelly Mccarley, DO [facility] Direct contact: see [email or web address]  7PM-7AM contact night coverage as above 12/11/2019, 7:43 PM  LOS: 1 day

## 2019-12-11 NOTE — Consult Note (Signed)
[address]       [place] [address]             [phone]                    Kelly Douglas [place] Medical Record #[id number] Date of Birth: Feb 25, 1955  Referring: No ref. provider found Primary Care: [name], MD Primary Cardiologist: No primary care provider on file.  Chief Complaint:    Chief Complaint  Patient presents with  . Pneumothorax    History of Present Illness:    Kelly Douglas 64 y.o. female with multiple medical problems was transferred from [facility] for management of a chronic pneumothorax.  She underwent a navigational bronchoscopy with biopsy of a pulmonary nodule on November 4 of this year and subsequently had a postprocedural pneumothorax that was contained and stable.  When she presented to [facility] the original plan was for direct drainage of the pneumothorax that there was no air leak.  She continues to have some shortness of breath and is on supplemental oxygen right now.  She has some pain at the chest tube site.      Past Medical History:  Diagnosis Date  . Anxiety   . Arteriosclerotic cardiovascular disease (ASCVD) 2013   coronary calcification-left main, LAD and CX; small pericardial effusion  . Arthritis    hips  . Chronic back pain   . Chronic bronchitis   . Chronic lung disease    Chronic scarring and volume loss-left lung; characteristics of a chronic infectious process-possible MAI  . Depression   . Dyspnea    occasional   . GERD (gastroesophageal reflux disease)    Dr Gala Romney EGD 09/2009->esophagitis, sm HH, antral erosions, atonic esophagus  . Hyperlipidemia   . Hypertension   . Hypothyroid 1981 approx  . Scleroderma ([place])   . Seasonal allergies   . Syncope    Multiple spells over the past 40+ years, likely neurocardiogenic  . Tobacco abuse 06/24/2009    Past Surgical History:  Procedure Laterality Date  . BILATERAL SALPINGOOPHORECTOMY  2013    Dr. Elonda Husky; uterus remains in  situ  . BRONCHIAL BIOPSY  11/21/2019   Procedure: BRONCHIAL BIOPSIES;  Surgeon: Collene Gobble, MD;  Location: [facility] ENDOSCOPY;  Service: Pulmonary;;  . BRONCHIAL BRUSHINGS  11/21/2019   Procedure: BRONCHIAL BRUSHINGS;  Surgeon: Collene Gobble, MD;  Location: [facility] ENDOSCOPY;  Service: Pulmonary;;  . BRONCHIAL NEEDLE ASPIRATION BIOPSY  11/21/2019   Procedure: BRONCHIAL NEEDLE ASPIRATION BIOPSIES;  Surgeon: Collene Gobble, MD;  Location: [place];  Service: Pulmonary;;  . BRONCHIAL WASHINGS  11/21/2019   Procedure: BRONCHIAL WASHINGS;  Surgeon: Collene Gobble, MD;  Location: [facility] ENDOSCOPY;  Service: Pulmonary;;  . COLONOSCOPY  09/28/09   anal papilla otherwise normal  . COLONOSCOPY N/A 08/29/2012   VZD:GLOVFIE polyp-removed as described above. tubular adenoma  . COLONOSCOPY WITH PROPOFOL N/A 11/11/2019   Procedure: COLONOSCOPY WITH PROPOFOL;  Surgeon: Daneil Dolin, MD;  [facility]: [facility] ENDO SUITE;  Service: Endoscopy;  Laterality: N/A;  2:30pm  . ESOPHAGOGASTRODUODENOSCOPY  10/07/09   Dr. Barrie Dunker of esophageal mucosa, diffusely ?esophagitis (bx benign), small HH/antral erosions, erythema bx benign. 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 ESOPHAGOGASTRODUODENOSCOPY (EGD) WITH ESOPHAGEAL DILATION N/A 03/07/2012   PPI:RJJOACZY, patent, tubular esophagus of uncertain significance-status post biopsy )unremarkable). Hiatal hernia  . FUDUCIAL PLACEMENT  10/18/2017   Procedure: PLACEMENT OF 3 FUDUCIAL INTO LEFT LOWER  LOBE OF LUNG-TARGET 1;  Surgeon: Collene Gobble, MD;  Location: [place];  Service: Thoracic;;  . LAPAROSCOPIC APPENDECTOMY  07/16/2011   Procedure: APPENDECTOMY LAPAROSCOPIC;  Surgeon: Donato Heinz, MD;  Location: [facility] ORS;  Service: General;  Laterality: N/A;  . POLYPECTOMY  11/11/2019   Procedure: POLYPECTOMY;  Surgeon: Daneil Dolin, MD;  Location: [facility] ENDO SUITE;  Service: Endoscopy;;  . TUBAL LIGATION    . VIDEO BRONCHOSCOPY  10/18/2017  . VIDEO BRONCHOSCOPY WITH  ENDOBRONCHIAL NAVIGATION N/A 10/18/2017   Procedure: VIDEO BRONCHOSCOPY WITH ENDOBRONCHIAL NAVIGATION;  Surgeon: Collene Gobble, MD;  Location: [facility] OR;  Service: Thoracic;  Laterality: N/A;  . VIDEO BRONCHOSCOPY WITH ENDOBRONCHIAL NAVIGATION Left 11/21/2019   Procedure: VIDEO BRONCHOSCOPY WITH ENDOBRONCHIAL NAVIGATION;  Surgeon: Collene Gobble, MD;  Location: [place] ENDOSCOPY;  Service: Pulmonary;  Laterality: Left;    Family History  Problem Relation Age of Onset  . Stroke Father   . Aneurysm Father   . Coronary artery disease Brother   . Hypertension Brother   . Down syndrome Brother   . Hypertension Brother   . Hypertension Brother        father/mother  . Aneurysm Mother   . Throat cancer Brother 49       throat cancer  . Hypertension Sister   . Diabetes Brother   . Diabetes Brother   . Anesthesia problems Neg Hx   . Hypotension Neg Hx   . Malignant hyperthermia Neg Hx   . Pseudochol deficiency Neg Hx   . Colon cancer Neg Hx      Social History   Tobacco Use  Smoking Status Current Every Day Smoker  . Packs/day: 0.50  . Years: 45.00  . Pack years: 22.50  . Types: Cigarettes  . Start date: 07/14/1971  Smokeless Tobacco Never Used  Tobacco Comment   1 PPD current plans to reduce after surgery on 11/21/19    Social History   Substance and Sexual Activity  Alcohol Use Not Currently  . Alcohol/week: 0.0 standard drinks   Comment: quit June 2012-used to drink 6 pack daily, occasional beer per pt 11/20/19     Allergies  Allergen Reactions  . Aspirin Other (See Comments)    Nose bleeds    Current Facility-Administered Medications  Medication Dose Route Frequency Provider Last Rate Last Admin  . acetaminophen (TYLENOL) tablet 650 mg  650 mg Oral Q6H PRN Zierle-Ghosh, Asia B, DO   650 mg at 12/10/19 1809   Or  . acetaminophen (TYLENOL) suppository 650 mg  650 mg Rectal Q6H PRN Zierle-Ghosh, Asia B, DO      . albuterol (PROVENTIL) (2.5 MG/3ML) 0.083% nebulizer solution  2.5 mg  2.5 mg Nebulization Q6H PRN Zierle-Ghosh, Asia B, DO      . amLODipine (NORVASC) tablet 5 mg  5 mg Oral Daily Zierle-Ghosh, Asia B, DO   5 mg at 12/11/19 0922  . atorvastatin (LIPITOR) tablet 10 mg  10 mg Oral Daily Zierle-Ghosh, Asia B, DO   10 mg at 12/11/19 6712  . DULoxetine (CYMBALTA) DR capsule 60 mg  60 mg Oral Daily Zierle-Ghosh, Asia B, DO   60 mg at 12/11/19 4580  . heparin injection 5,000 Units  5,000 Units Subcutaneous Q8H Zierle-Ghosh, Asia B, DO   5,000 Units at 12/11/19 9983  . hydrOXYzine (ATARAX/VISTARIL) tablet 25 mg  25 mg Oral TID PRN Zierle-Ghosh, Asia B, DO   25 mg at 12/11/19 0120  . [START ON 12/12/2019] levothyroxine (SYNTHROID) tablet  37.5 mcg  37.5 mcg Oral Q48H Zierle-Ghosh, Asia B, DO      . levothyroxine (SYNTHROID) tablet 75 mcg  75 mcg Oral Q48H Zierle-Ghosh, Asia B, DO   75 mcg at 12/11/19 6948  . montelukast (SINGULAIR) tablet 10 mg  10 mg Oral QHS Zierle-Ghosh, Asia B, DO   10 mg at 12/11/19 0120  . morphine 2 MG/ML injection 2 mg  2 mg Intravenous Q2H PRN Zierle-Ghosh, Asia B, DO   2 mg at 12/11/19 0923  . ondansetron (ZOFRAN) tablet 4 mg  4 mg Oral Q6H PRN Zierle-Ghosh, Asia B, DO       Or  . ondansetron (ZOFRAN) injection 4 mg  4 mg Intravenous Q6H PRN Zierle-Ghosh, Asia B, DO      . oxyCODONE (Oxy IR/ROXICODONE) immediate release tablet 5 mg  5 mg Oral Q4H PRN Zierle-Ghosh, Asia B, DO   5 mg at 12/11/19 5462  . pantoprazole (PROTONIX) EC tablet 40 mg  40 mg Oral Daily Zierle-Ghosh, Asia B, DO   40 mg at 12/11/19 7035  . polyethylene glycol (MIRALAX / GLYCOLAX) packet 17 g  17 g Oral Daily PRN Zierle-Ghosh, Asia B, DO        Review of Systems  Respiratory: Positive for cough and shortness of breath.   Cardiovascular: Positive for chest pain.     PHYSICAL EXAMINATION: BP 128/83   Pulse 83   Temp 98 F (36.7 C) (Oral)   Resp 17   Ht 5\' 8"  (1.727 m)   Wt 63.1 kg   LMP  (LMP Unknown)   SpO2 95%   BMI 21.15 kg/m  Physical  Exam Constitutional:      Appearance: Normal appearance.  Cardiovascular:     Rate and Rhythm: Normal rate.  Pulmonary:     Comments: Coarse breath sounds Wet cough No leak on Pleur-evac system.  Musculoskeletal:     Cervical back: Normal range of motion.  Neurological:     General: No focal deficit present.     Mental Status: She is alert and oriented to person, place, and time.     Diagnostic Studies & Laboratory data:     Recent Radiology Findings:   NM PET Image Restage (PS) Skull Base to Thigh  Addendum Date: 12/10/2019   ADDENDUM REPORT: 12/10/2019 13:17 ADDENDUM: The original report was by Dr. Van Clines. The following addendum is by Dr. Van Clines: Critical Value/emergent results were called by telephone at the time of interpretation on 12/10/2019 at 1:02 pm to provider Dr. Baltazar Apo , who verbally acknowledged these results. Electronically Signed   By: Van Clines M.D.   On: 12/10/2019 13:17   Result Date: 12/10/2019 CLINICAL DATA:  Initial treatment strategy for non-small cell lung cancer. EXAM: NUCLEAR MEDICINE PET SKULL BASE TO THIGH TECHNIQUE: 10.2 mCi F-18 FDG was injected intravenously. Full-ring PET imaging was performed from the skull base to thigh after the radiotracer. CT data was obtained and used for attenuation correction and anatomic localization. Fasting blood glucose: 98 mg/dl COMPARISON:  Multiple exams, including CT chest 08/05/2019 and PET-CT from 10/02/2017 FINDINGS: Mediastinal blood pool activity: SUV max 2.7 Liver activity: SUV max NA NECK: No significant abnormal hypermetabolic activity in this region. Incidental CT findings: Bilateral common carotid atherosclerotic calcification. CHEST: Left-sided pneumothorax occupying about 50% of left hemithoracic activity. This causes some misregistration of the PET and CT data, but the lesion of concern is believed to have a maximum SUV of about 4.7, suspicious for malignancy. The scar-like  banding at the left lung apex has a maximum SUV of about 2.8, and is more likely to be inflammatory. Incidental CT findings: Left anterior descending coronary artery atherosclerosis and aortic atherosclerosis. Fiducials noted in the left lung. There is some tethering of the left lung apex to the pleura due to scarring. ABDOMEN/PELVIS: No significant abnormal hypermetabolic activity in this region. Incidental CT findings: Aortoiliac atherosclerotic vascular disease. SKELETON: No significant abnormal hypermetabolic activity in this region. Incidental CT findings: Grade 1 degenerative anterolisthesis at L4-5. Lumbar spondylosis and degenerative disc disease at L5-S1. IMPRESSION: 1. The 2 cm left lower lobe pulmonary nodule has sub solid elements has a maximum SUV of 4.7, suspicious for malignancy. No compelling findings of distant metastatic spread. 2. 50% left-sided pneumothorax.  No shift of mediastinal structures. 3.  Aortic Atherosclerosis (ICD10-I70.0).  Coronary atherosclerosis. 4. Lower lumbar spondylosis and degenerative disc disease. Radiology assistant personnel have been notified to put me in telephone contact with the referring physician or the referring physician's clinical representative in order to discuss these findings. Once this communication is established I will issue an addendum to this report for documentation purposes. Electronically Signed: By: Van Clines M.D. On: 12/10/2019 13:00   Portable chest 1 View  Result Date: 12/11/2019 CLINICAL DATA:  Pneumothorax. Left sided chest tube in place. Chest soreness. EXAM: PORTABLE CHEST 1 VIEW COMPARISON:  One-view chest x-ray 12/10/2019 FINDINGS: Left-sided pigtail catheter is stable in position. There is some fluid or pleural thickening at the left apex. No definite pneumothorax remains. Right lung is clear. Chronic interstitial coarsening noted. Heart is enlarged. Atherosclerotic changes are present. IMPRESSION: 1. Stable left-sided pigtail  catheter. 2. Residual fluid or pleural thickening at the left apex. 3. No definite pneumothorax. Electronically Signed   By: San Morelle M.D.   On: 12/11/2019 07:49   DG Chest Port 1 View  Result Date: 12/10/2019 CLINICAL DATA:  Follow-up pneumothorax EXAM: PORTABLE CHEST 1 VIEW COMPARISON:  09/21/2019.  PET CT 12/09/2019 FINDINGS: Moderate to large left pneumothorax again noted. This is likely similar or slightly smaller than prior study. Right lung clear. Areas of atelectasis in the left base. Heart is normal size. No acute bony abnormality. IMPRESSION: Moderate to large left pneumothorax again noted, stable or minimally decreased since prior study. Left base atelectasis Critical Value/emergent results were called by telephone at the time of interpretation on 12/10/2019 at 3:22 pm to provider [facility] , who verbally acknowledged these results. Electronically Signed   By: Rolm Baptise M.D.   On: 12/10/2019 15:22   DG Chest Port 1 View  Result Date: 11/21/2019 CLINICAL DATA:  Post bronchoscopy and biopsy left lower lobe nodule. EXAM: PORTABLE CHEST 1 VIEW COMPARISON:  03/28/2018 FINDINGS: No pneumothorax or effusion post bronchoscopy. Volume loss the left lung with surgical clips in the left lung base unchanged. There is hyperinflation of the right lung due to chronic lung disease. Mild airspace disease in the left lung best seen on recent CT. IMPRESSION: No complication post left lower lobe nodule biopsy via bronchoscopy. Electronically Signed   By: Franchot Gallo M.D.   On: 11/21/2019 11:27   DG Chest Port 1V same Day  Result Date: 12/10/2019 CLINICAL DATA:  Chest tube placement EXAM: PORTABLE CHEST 1 VIEW COMPARISON:  Earlier same day FINDINGS: Interval placement of left apically directed chest tube. Residual small apical pneumothorax. Volume loss in the left thorax. Nodular opacity of the lower left lung corresponding to hypermetabolic mass on PET-CT. Right lung is clear. Normal heart  size. IMPRESSION: Interval chest tube placement with residual small left apical pneumothorax. Electronically Signed   By: Macy Mis M.D.   On: 12/10/2019 16:16   DG C-ARM BRONCHOSCOPY  Result Date: 11/21/2019 C-ARM BRONCHOSCOPY: Fluoroscopy was utilized by the requesting physician.  No radiographic interpretation.       I have independently reviewed the above radiology studies  and reviewed the findings with the patient.   Recent Lab Findings: Lab Results  Component Value Date   WBC 10.3 12/10/2019   HGB 12.0 12/10/2019   HCT 37.5 12/10/2019   PLT 452 (H) 12/10/2019   GLUCOSE 98 12/10/2019   CHOL 132 09/20/2019   TRIG 398 (H) 09/20/2019   HDL 31 (L) 09/20/2019   LDLCALC 42 09/20/2019   ALT 15 12/10/2019   AST 25 12/10/2019   NA 139 12/10/2019   K 3.2 (L) 12/10/2019   CL 102 12/10/2019   CREATININE 0.82 12/10/2019   BUN 5 (L) 12/10/2019   CO2 26 12/10/2019   TSH 1.410 09/20/2019   INR 1.1 (H) 11/04/2019   HGBA1C 5.1 10/27/2015      Assessment / Plan:   64 year old female with a 2 cm left lower lobe pulmonary nodule who underwent a navigational bronchoscopy on November 4.  On pet imaging she was noted to have a 50% pneumothorax and subsequently had a chest tube placed.  Given that this pneumothorax is likely postprocedural has been stable for the past 2 weeks, it is likely healed.  She currently does not have evidence of air leak on exam today, and her lung is completely expanded on chest x-ray.  I spoke with Dr. Lamonte Sakai, and we agree that the chest tube can come out today.  She can get a repeat chest x-ray tomorrow morning and if it is stable then she can go home.  In regards to her potential lung cancer, she will follow-up with Dr. Lamonte Sakai to discuss the results.     I  spent 30 minutes with  the patient face to face and greater then 50% of the time was spent in counseling and coordination of care.    Lajuana Matte 12/11/2019 10:48 AM

## 2019-12-12 ENCOUNTER — Inpatient Hospital Stay (HOSPITAL_COMMUNITY): Payer: Medicare HMO

## 2019-12-12 DIAGNOSIS — J939 Pneumothorax, unspecified: Secondary | ICD-10-CM | POA: Diagnosis not present

## 2019-12-12 LAB — CBC WITH DIFFERENTIAL/PLATELET
Abs Immature Granulocytes: 0.03 10*3/uL (ref 0.00–0.07)
Basophils Absolute: 0 10*3/uL (ref 0.0–0.1)
Basophils Relative: 0 %
Eosinophils Absolute: 0.1 10*3/uL (ref 0.0–0.5)
Eosinophils Relative: 1 %
HCT: 34.9 % — ABNORMAL LOW (ref 36.0–46.0)
Hemoglobin: 11.2 g/dL — ABNORMAL LOW (ref 12.0–15.0)
Immature Granulocytes: 0 %
Lymphocytes Relative: 41 %
Lymphs Abs: 3.9 10*3/uL (ref 0.7–4.0)
MCH: 25.9 pg — ABNORMAL LOW (ref 26.0–34.0)
MCHC: 32.1 g/dL (ref 30.0–36.0)
MCV: 80.8 fL (ref 80.0–100.0)
Monocytes Absolute: 0.9 10*3/uL (ref 0.1–1.0)
Monocytes Relative: 9 %
Neutro Abs: 4.6 10*3/uL (ref 1.7–7.7)
Neutrophils Relative %: 49 %
Platelets: 427 10*3/uL — ABNORMAL HIGH (ref 150–400)
RBC: 4.32 MIL/uL (ref 3.87–5.11)
RDW: 14.6 % (ref 11.5–15.5)
WBC: 9.5 10*3/uL (ref 4.0–10.5)
nRBC: 0 % (ref 0.0–0.2)

## 2019-12-12 LAB — BASIC METABOLIC PANEL
Anion gap: 12 (ref 5–15)
BUN: <5 mg/dL — ABNORMAL LOW (ref 8–23)
CO2: 27 mmol/L (ref 22–32)
Calcium: 8.9 mg/dL (ref 8.9–10.3)
Chloride: 100 mmol/L (ref 98–111)
Creatinine, Ser: 0.87 mg/dL (ref 0.44–1.00)
GFR, Estimated: >60 mL/min (ref >60)
Glucose, Bld: 99 mg/dL (ref 70–99)
Potassium: 3.7 mmol/L (ref 3.5–5.1)
Sodium: 139 mmol/L (ref 135–145)

## 2019-12-12 MED ORDER — OXYCODONE HCL 5 MG PO TABS: 5.0000 mg | ORAL_TABLET | ORAL | 0 refills | Status: DC | PRN

## 2019-12-12 MED ORDER — GUAIFENESIN-DM 100-10 MG/5ML PO SYRP
5.0000 mL | ORAL_SOLUTION | ORAL | Status: DC | PRN
  Administered 2019-12-12: 5 mL via ORAL
  Filled 2019-12-12: qty 5

## 2019-12-12 MED ORDER — HYDROXYZINE HCL 25 MG PO TABS
25.0000 mg | ORAL_TABLET | Freq: Three times a day (TID) | ORAL | 0 refills | Status: DC | PRN
Start: 2019-12-12 — End: 2020-03-04

## 2019-12-12 MED ORDER — GUAIFENESIN-DM 100-10 MG/5ML PO SYRP: 5.0000 mL | ORAL_SOLUTION | ORAL | 0 refills | Status: DC | PRN

## 2019-12-12 NOTE — Discharge Summary (Signed)
Physician Discharge Summary  [name]:[id number] DOB: 05-02-1955 DOA: 12/10/2019  PCP: [name], MD  Admit date: 12/10/2019 Discharge date: 12/12/2019  Recommendations for Outpatient Follow-up:  1. Discharge to home. 2. Follow up with PCP in 7-10 days. She should have a chemistry drawn on that visit.  3. Follow up with Dr. Lamonte Sakai in 2 weeks.  Discharge Diagnoses: Principal diagnosis is #1 1. Acute moderate to large left pneumothorax s/p chest tube with resolution of pneumothorax 2. Hypothyroidism 3. Thrombocytosis 4. Hypokalemia 5. GERD 6. Hypertension 7. Pulmonary nodules  Discharge Condition: Fair  Disposition: Home  Diet recommendation: Regular  Filed Weights   12/10/19 1434 12/11/19 0628  Weight: 63 kg 63.1 kg    History of present illness:  Kelly Douglas  is a 64 y.o. female, with history of scleroderma, hypothyroid, hypertension, hyperlipidemia, GERD, ASCVD, anxiety, and more presents to the ER with a chief complaint of dyspnea.  Patient has been undergoing a lung cancer work-up in the ambulatory setting and had a bronchial approach biopsy of a lung nodule on November 4th.  Shortly after that she started to feel chest tightness and shortness of breath.  Patient has an albuterol inhaler that she started using 2-3 times a day.  It offered temporary relief of her symptoms for about 1/2-hour, and then her symptoms would recur.  She then started having a productive cough with sputum and blood.  She then went in for a PET scan yesterday, and today in the office called her and told her to come into the ER for pneumothorax.  Patient denies any chest pain up until her chest tube was placed.  She does report that laying on her left side and coughing caused her to have left-sided chest wall pain, but she did not think much of it.  She reports that the pain was mild to moderate.  She describes it as sharp.  It would go away when she stopped coughing or when she  repositioned herself in bed.  Patient is a current smoker smoking between half a pack and a pack per day.  She has been trying to wean herself down from 1 pack a day.  She drinks 1 beer several nights a week.  She denies illicit drug use.  Patient is full code.  In the ED temperature 97.6, heart rate 114, respiratory rate 18, blood pressure 118/89 sitting and 100% on 2 L nasal cannula No leukocytosis with a white blood cell count 10.3, there is a thrombocytosis at 452. Chemistry panel reveals hypokalemia at 3.2 and is otherwise unremarkable. Chest x-ray shows moderate to large left pneumothorax. Chest tube was placed Cardiothoracic surgery was contacted and agreed with chest tube and requested patient be transferred to [facility] so they can consult in person.  Hospital Course:  [facility] were consulted to admit the patient for further evaluation and treatment.    The patient was evaluated by CTS the morning after transfer. The pneumothorax was judged to be resolved. Dr. Kipp Brood ordered that the chest tube be removed. CXR was re-evaluated this morning. Pneumothorax is not present. The patient was ambulated in the halls and was found to have Oxygen saturations in the mid-nineties on room air.  The patient will be discharged to home in fair condition.  Today's assessment: S: The patient is resting comfortably. No new complaints. O: Vitals:  Vitals:   12/12/19 0811 12/12/19 1335  BP: 129/76 132/84  Pulse: 87 85  Resp: 20 18  Temp: 98.4  F (36.9 C) 98.1 F (36.7 C)  SpO2: 97% 97%   Exam:  Constitutional:   The patient is awake, alert, and oriented x 3. No acute distress. Respiratory:   No increased work of breathing.  No wheezes, rales, or rhonchi  No tactile fremitus Cardiovascular:   Regular rate and rhythm  No murmurs, ectopy, or gallups.  No lateral PMI. No thrills. Abdomen:   Abdomen is soft, non-tender, non-distended  No hernias, masses, or  organomegaly  Normoactive bowel sounds.  Musculoskeletal:   No cyanosis, clubbing, or edema Skin:   No rashes, lesions, ulcers  palpation of skin: no induration or nodules Neurologic:   CN 2-12 intact  Sensation all 4 extremities intact Psychiatric:   Mental status o Mood, affect appropriate o Orientation to person, place, time   judgment and insight appear intact   Discharge Instructions  Discharge Instructions    Activity as tolerated - No restrictions   Complete by: As directed    Call MD for:  difficulty breathing, headache or visual disturbances   Complete by: As directed    Call MD for:  temperature >100.4   Complete by: As directed    Diet - low sodium heart healthy   Complete by: As directed    Discharge instructions   Complete by: As directed    Discharge to home. Follow up with PCP in 7-10 days. She should have a chemistry drawn on that visit.  Follow up with Dr. Lamonte Sakai in 2 weeks.   Increase activity slowly   Complete by: As directed      Allergies as of 12/12/2019      Reactions   Aspirin Other (See Comments)   Nose bleeds      Medication List    STOP taking these medications   hydrOXYzine 50 MG capsule Commonly known as: VISTARIL   meloxicam 7.5 MG tablet Commonly known as: MOBIC     TAKE these medications   acetaminophen 650 MG CR tablet Commonly known as: TYLENOL Take 1,300 mg by mouth every 8 (eight) hours as needed for pain.   amLODipine 5 MG tablet Commonly known as: NORVASC Take 1 tablet (5 mg total) by mouth daily.   atorvastatin 10 MG tablet Commonly known as: LIPITOR Take 1 tablet (10 mg total) by mouth daily.   DULoxetine 60 MG capsule Commonly known as: CYMBALTA Take 1 capsule (60 mg total) by mouth daily.   guaiFENesin-dextromethorphan 100-10 MG/5ML syrup Commonly known as: ROBITUSSIN DM Take 5 mLs by mouth every 4 (four) hours as needed for cough.   hydrOXYzine 25 MG tablet Commonly known as:  ATARAX/VISTARIL Take 1 tablet (25 mg total) by mouth 3 (three) times daily as needed for anxiety or itching. What changed:   how much to take  how to take this  when to take this  reasons to take this  additional instructions   Icy Hot Lidocaine Plus Menthol 4-1 % Ptch Generic drug: Lidocaine-Menthol Apply 1 patch topically daily as needed (pain).   levothyroxine 75 MCG tablet Commonly known as: SYNTHROID Take 0.5-1 tablets (37.5-75 mcg total) by mouth See admin instructions. Take 75 mcg by mouth every other day before breakfast, alternating with 37.5 mcg   Magnesium 500 MG Caps Take 1 capsule by mouth daily.   montelukast 10 MG tablet Commonly known as: SINGULAIR Take 1 tablet (10 mg total) by mouth at bedtime.   oxyCODONE 5 MG immediate release tablet Commonly known as: Oxy IR/ROXICODONE Take 1 tablet (5 mg  total) by mouth every 4 (four) hours as needed for moderate pain.   pantoprazole 40 MG tablet Commonly known as: PROTONIX TAKE 1 TABLET TWICE DAILY   potassium chloride 10 MEQ tablet Commonly known as: Klor-Con 10 Take 1 tablet (10 mEq total) by mouth daily.   ProAir RespiClick 510 (90 Base) MCG/ACT Aepb Generic drug: Albuterol Sulfate Inhale 2 puffs into the lungs 4 (four) times daily as needed.   Vitamin D 125 MCG (5000 UT) Caps Take 5,000 Units by mouth daily.      Allergies  Allergen Reactions   Aspirin Other (See Comments)    Nose bleeds    The results of significant diagnostics from this hospitalization (including imaging, microbiology, ancillary and laboratory) are listed below for reference.    Significant Diagnostic Studies: NM PET Image Restage (PS) Skull Base to Thigh  Addendum Date: 12/10/2019   ADDENDUM REPORT: 12/10/2019 13:17 ADDENDUM: The original report was by Dr. Van Clines. The following addendum is by Dr. Van Clines: Critical Value/emergent results were called by telephone at the time of interpretation on 12/10/2019  at 1:02 pm to provider Dr. Baltazar Apo , who verbally acknowledged these results. Electronically Signed   By: Van Clines M.D.   On: 12/10/2019 13:17   Result Date: 12/10/2019 CLINICAL DATA:  Initial treatment strategy for non-small cell lung cancer. EXAM: NUCLEAR MEDICINE PET SKULL BASE TO THIGH TECHNIQUE: 10.2 mCi F-18 FDG was injected intravenously. Full-ring PET imaging was performed from the skull base to thigh after the radiotracer. CT data was obtained and used for attenuation correction and anatomic localization. Fasting blood glucose: 98 mg/dl COMPARISON:  Multiple exams, including CT chest 08/05/2019 and PET-CT from 10/02/2017 FINDINGS: Mediastinal blood pool activity: SUV max 2.7 Liver activity: SUV max NA NECK: No significant abnormal hypermetabolic activity in this region. Incidental CT findings: Bilateral common carotid atherosclerotic calcification. CHEST: Left-sided pneumothorax occupying about 50% of left hemithoracic activity. This causes some misregistration of the PET and CT data, but the lesion of concern is believed to have a maximum SUV of about 4.7, suspicious for malignancy. The scar-like banding at the left lung apex has a maximum SUV of about 2.8, and is more likely to be inflammatory. Incidental CT findings: Left anterior descending coronary artery atherosclerosis and aortic atherosclerosis. Fiducials noted in the left lung. There is some tethering of the left lung apex to the pleura due to scarring. ABDOMEN/PELVIS: No significant abnormal hypermetabolic activity in this region. Incidental CT findings: Aortoiliac atherosclerotic vascular disease. SKELETON: No significant abnormal hypermetabolic activity in this region. Incidental CT findings: Grade 1 degenerative anterolisthesis at L4-5. Lumbar spondylosis and degenerative disc disease at L5-S1. IMPRESSION: 1. The 2 cm left lower lobe pulmonary nodule has sub solid elements has a maximum SUV of 4.7, suspicious for malignancy.  No compelling findings of distant metastatic spread. 2. 50% left-sided pneumothorax.  No shift of mediastinal structures. 3.  Aortic Atherosclerosis (ICD10-I70.0).  Coronary atherosclerosis. 4. Lower lumbar spondylosis and degenerative disc disease. Radiology assistant personnel have been notified to put me in telephone contact with the referring physician or the referring physician's clinical representative in order to discuss these findings. Once this communication is established I will issue an addendum to this report for documentation purposes. Electronically Signed: By: Van Clines M.D. On: 12/10/2019 13:00   DG CHEST PORT 1 VIEW  Result Date: 12/12/2019 CLINICAL DATA:  Chest tube removal EXAM: PORTABLE CHEST 1 VIEW COMPARISON:  December 11, 2019, December 09, 2019 FINDINGS: The cardiomediastinal silhouette  is unchanged in contour.Tortuous thoracic aorta. Removal of LEFT-sided chest tube. Trace LEFT pleural effusion. No significant pneumothorax. LEFT basilar atelectasis. LEFT-sided pulmonary nodules of the lung base and apex are better assessed on recent PET-CT. Visualized abdomen is unremarkable. Dextrocurvature of the lower thoracic spine. IMPRESSION: 1. Removal of LEFT-sided chest tube. No significant pneumothorax. Electronically Signed   By: Valentino Saxon MD   On: 12/12/2019 11:29   Portable chest 1 View  Result Date: 12/11/2019 CLINICAL DATA:  Pneumothorax. Left sided chest tube in place. Chest soreness. EXAM: PORTABLE CHEST 1 VIEW COMPARISON:  One-view chest x-ray 12/10/2019 FINDINGS: Left-sided pigtail catheter is stable in position. There is some fluid or pleural thickening at the left apex. No definite pneumothorax remains. Right lung is clear. Chronic interstitial coarsening noted. Heart is enlarged. Atherosclerotic changes are present. IMPRESSION: 1. Stable left-sided pigtail catheter. 2. Residual fluid or pleural thickening at the left apex. 3. No definite pneumothorax.  Electronically Signed   By: San Morelle M.D.   On: 12/11/2019 07:49   DG Chest Port 1 View  Result Date: 12/10/2019 CLINICAL DATA:  Follow-up pneumothorax EXAM: PORTABLE CHEST 1 VIEW COMPARISON:  09/21/2019.  PET CT 12/09/2019 FINDINGS: Moderate to large left pneumothorax again noted. This is likely similar or slightly smaller than prior study. Right lung clear. Areas of atelectasis in the left base. Heart is normal size. No acute bony abnormality. IMPRESSION: Moderate to large left pneumothorax again noted, stable or minimally decreased since prior study. Left base atelectasis Critical Value/emergent results were called by telephone at the time of interpretation on 12/10/2019 at 3:22 pm to provider [facility] , who verbally acknowledged these results. Electronically Signed   By: Rolm Baptise M.D.   On: 12/10/2019 15:22   DG Chest Port 1 View  Result Date: 11/21/2019 CLINICAL DATA:  Post bronchoscopy and biopsy left lower lobe nodule. EXAM: PORTABLE CHEST 1 VIEW COMPARISON:  03/28/2018 FINDINGS: No pneumothorax or effusion post bronchoscopy. Volume loss the left lung with surgical clips in the left lung base unchanged. There is hyperinflation of the right lung due to chronic lung disease. Mild airspace disease in the left lung best seen on recent CT. IMPRESSION: No complication post left lower lobe nodule biopsy via bronchoscopy. Electronically Signed   By: Franchot Gallo M.D.   On: 11/21/2019 11:27   DG Chest Port 1V same Day  Result Date: 12/10/2019 CLINICAL DATA:  Chest tube placement EXAM: PORTABLE CHEST 1 VIEW COMPARISON:  Earlier same day FINDINGS: Interval placement of left apically directed chest tube. Residual small apical pneumothorax. Volume loss in the left thorax. Nodular opacity of the lower left lung corresponding to hypermetabolic mass on PET-CT. Right lung is clear. Normal heart size. IMPRESSION: Interval chest tube placement with residual small left apical pneumothorax.  Electronically Signed   By: Macy Mis M.D.   On: 12/10/2019 16:16   DG C-ARM BRONCHOSCOPY  Result Date: 11/21/2019 C-ARM BRONCHOSCOPY: Fluoroscopy was utilized by the requesting physician.  No radiographic interpretation.    Microbiology: Recent Results (from the past 240 hour(s))  Resp Panel by RT-PCR (Flu A&B, Covid) Nasopharyngeal Swab     Status: None   Collection Time: 12/10/19  2:55 PM   Specimen: Nasopharyngeal Swab; Nasopharyngeal(NP) swabs in vial transport medium  Result Value Ref Range Status   SARS Coronavirus 2 by RT PCR NEGATIVE NEGATIVE Final    Comment: (NOTE) SARS-CoV-2 target nucleic acids are NOT DETECTED.  The SARS-CoV-2 RNA is generally detectable in upper respiratory specimens  during the acute phase of infection. The lowest concentration of SARS-CoV-2 viral copies this assay can detect is 138 copies/mL. A negative result does not preclude SARS-Cov-2 infection and should not be used as the sole basis for treatment or other patient management decisions. A negative result may occur with  improper specimen collection/handling, submission of specimen other than nasopharyngeal swab, presence of viral mutation(s) within the areas targeted by this assay, and inadequate number of viral copies(<138 copies/mL). A negative result must be combined with clinical observations, patient history, and epidemiological information. The expected result is Negative.  Fact Sheet for Patients:  EntrepreneurPulse.com.au  Fact Sheet for Healthcare Providers:  IncredibleEmployment.be  This test is no t yet approved or cleared by the [place] FDA and  has been authorized for detection and/or diagnosis of SARS-CoV-2 by FDA under an Emergency Use Authorization (EUA). This EUA will remain  in effect (meaning this test can be used) for the duration of the COVID-19 declaration under Section 564(b)(1) of the Act, 21 U.S.C.section  360bbb-3(b)(1), unless the authorization is terminated  or revoked sooner.       Influenza A by PCR NEGATIVE NEGATIVE Final   Influenza B by PCR NEGATIVE NEGATIVE Final    Comment: (NOTE) The Xpert Xpress SARS-CoV-2/FLU/RSV plus assay is intended as an aid in the diagnosis of influenza from Nasopharyngeal swab specimens and should not be used as a sole basis for treatment. Nasal washings and aspirates are unacceptable for Xpert Xpress SARS-CoV-2/FLU/RSV testing.  Fact Sheet for Patients: EntrepreneurPulse.com.au  Fact Sheet for Healthcare Providers: IncredibleEmployment.be  This test is not yet approved or cleared by the [place] FDA and has been authorized for detection and/or diagnosis of SARS-CoV-2 by FDA under an Emergency Use Authorization (EUA). This EUA will remain in effect (meaning this test can be used) for the duration of the COVID-19 declaration under Section 564(b)(1) of the Act, 21 U.S.C. section 360bbb-3(b)(1), unless the authorization is terminated or revoked.  Performed at [facility], [address] [place], [place] [address]      Labs: Basic Metabolic Panel: Recent Labs  Lab 12/10/19 1449 12/12/19 0116  NA 139 139  K 3.2* 3.7  CL 102 100  CO2 26 27  GLUCOSE 98 99  BUN 5* <5*  CREATININE 0.82 0.87  CALCIUM 9.0 8.9   Liver Function Tests: Recent Labs  Lab 12/10/19 1449  AST 25  ALT 15  ALKPHOS 89  BILITOT 0.5  PROT 7.3  ALBUMIN 3.8   No results for input(s): LIPASE, AMYLASE in the last 168 hours. No results for input(s): AMMONIA in the last 168 hours. CBC: Recent Labs  Lab 12/10/19 1449 12/12/19 0116  WBC 10.3 9.5  NEUTROABS  --  4.6  HGB 12.0 11.2*  HCT 37.5 34.9*  MCV 82.1 80.8  PLT 452* 427*   Cardiac Enzymes: No results for input(s): CKTOTAL, CKMB, CKMBINDEX, TROPONINI in the last 168 hours. BNP: BNP (last 3 results) No results for input(s): BNP in the last 8760 hours.  ProBNP  (last 3 results) No results for input(s): PROBNP in the last 8760 hours.  CBG: No results for input(s): GLUCAP in the last 168 hours.  Principal Problem:   Pneumothorax on left Active Problems:   Hypothyroidism   HTN (hypertension)   Hypokalemia   Thrombocytosis   Time coordinating discharge: 38 minutes.  Signed:        Laiyla Slagel, DO [facility]  12/12/2019, 1:37 PM

## 2019-12-12 NOTE — Progress Notes (Signed)
Discontinued telemetry and removed IV without complications. Patient given discharge instructions and packet. Patient discharged with all of her possessions.   Donnelly Angelica, RN

## 2019-12-16 ENCOUNTER — Telehealth: Payer: Self-pay

## 2019-12-16 NOTE — Telephone Encounter (Signed)
Transition Care Management Unsuccessful Follow-up Telephone Call  Date of discharge and from where: [facility] 12/12/19 Attempts:  2nd Attempt  Reason for unsuccessful TCM follow-up call:  Unable to reach patient

## 2019-12-16 NOTE — Telephone Encounter (Signed)
Transition Care Management Unsuccessful Follow-up Telephone Call  Date of discharge and from where:  12/12/19 [facility]  Attempts:  1st Attempt  Reason for unsuccessful TCM follow-up call:  Unable to reach patient

## 2019-12-16 NOTE — Telephone Encounter (Signed)
Transition Care Management Follow-up Telephone Call  Date of discharge and from where: 12/12/2019 [facility]  How have you been since you were released from the hospital? Feeling better  Any questions or concerns? No  Items Reviewed:  Did the pt receive and understand the discharge instructions provided? Yes   Medications obtained and verified? Yes   Other? Yes   Any new allergies since your discharge? No   Dietary orders reviewed? Yes  Do you have support at home? Yes   Home Care and Equipment/Supplies: Were home health services ordered? not applicable If so, what is the name of the agency?   Has the agency set up a time to come to the patient's home? not applicable Were any new equipment or medical supplies ordered?  No What is the name of the medical supply agency?  Were you able to get the supplies/equipment? not applicable Do you have any questions related to the use of the equipment or supplies? No  Functional Questionnaire: (I = Independent and D = Dependent) ADLs: I but has help if needed  Bathing/Dressing- i  Meal Prep- d  Eating- i  Maintaining continence- i  Transferring/Ambulation- i  Managing Meds- i  Follow up appointments reviewed:   PCP Hospital f/u appt confirmed? Yes  Scheduled to see Donneta Romberg on 12/3 @ 9:20am.  [facility] f/u appt confirmed?  Scheduled to see  Are transportation arrangements needed? No   If their condition worsens, is the pt aware to call PCP or go to the Emergency Dept.? Yes  Was the patient provided with contact information for the PCP's office or ED? Yes  Was to pt encouraged to call back with questions or concerns? Yes

## 2019-12-19 ENCOUNTER — Other Ambulatory Visit: Payer: Self-pay

## 2019-12-19 ENCOUNTER — Encounter (HOSPITAL_COMMUNITY): Payer: Self-pay | Admitting: Hematology

## 2019-12-19 ENCOUNTER — Inpatient Hospital Stay (HOSPITAL_COMMUNITY): Payer: Medicare HMO | Attending: Hematology | Admitting: Hematology

## 2019-12-19 VITALS — BP 129/78 | HR 99 | Temp 98.7°F | Resp 18 | Ht 68.0 in | Wt 140.0 lb

## 2019-12-19 DIAGNOSIS — J984 Other disorders of lung: Secondary | ICD-10-CM | POA: Insufficient documentation

## 2019-12-19 DIAGNOSIS — M349 Systemic sclerosis, unspecified: Secondary | ICD-10-CM | POA: Diagnosis not present

## 2019-12-19 DIAGNOSIS — F419 Anxiety disorder, unspecified: Secondary | ICD-10-CM | POA: Diagnosis not present

## 2019-12-19 DIAGNOSIS — I2510 Atherosclerotic heart disease of native coronary artery without angina pectoris: Secondary | ICD-10-CM | POA: Insufficient documentation

## 2019-12-19 DIAGNOSIS — K219 Gastro-esophageal reflux disease without esophagitis: Secondary | ICD-10-CM | POA: Insufficient documentation

## 2019-12-19 DIAGNOSIS — C3492 Malignant neoplasm of unspecified part of left bronchus or lung: Secondary | ICD-10-CM | POA: Diagnosis not present

## 2019-12-19 DIAGNOSIS — C3490 Malignant neoplasm of unspecified part of unspecified bronchus or lung: Secondary | ICD-10-CM

## 2019-12-19 DIAGNOSIS — C3432 Malignant neoplasm of lower lobe, left bronchus or lung: Secondary | ICD-10-CM | POA: Diagnosis not present

## 2019-12-19 DIAGNOSIS — Z79899 Other long term (current) drug therapy: Secondary | ICD-10-CM | POA: Diagnosis not present

## 2019-12-19 DIAGNOSIS — E039 Hypothyroidism, unspecified: Secondary | ICD-10-CM | POA: Insufficient documentation

## 2019-12-19 DIAGNOSIS — E785 Hyperlipidemia, unspecified: Secondary | ICD-10-CM | POA: Diagnosis not present

## 2019-12-19 DIAGNOSIS — F17210 Nicotine dependence, cigarettes, uncomplicated: Secondary | ICD-10-CM | POA: Insufficient documentation

## 2019-12-19 DIAGNOSIS — I119 Hypertensive heart disease without heart failure: Secondary | ICD-10-CM | POA: Diagnosis not present

## 2019-12-19 NOTE — Progress Notes (Signed)
[facility] [address], [place] [address]   CLINIC:  Medical Oncology/Hematology  CONSULT NOTE  Patient Care Team: [name], MD as PCP - General Rothbart, [name], MD (Inactive) (Cardiology) Gala Romney [name], MD (Gastroenterology) Teena Irani, PTA as Physical Therapy Assistant (Physical Therapy) Leta Baptist, MD as Consulting Physician (Otolaryngology)  CHIEF COMPLAINTS/PURPOSE OF CONSULTATION:  Evaluation of adenocarcinoma of left lung  HISTORY OF PRESENTING ILLNESS:  [name] 64 y.o. female is here because of evaluation of adenocarcinoma of left lung, at the request of Dr. Baltazar Apo from [facility].  Today she reports that she has been followed by Dr. Lamonte Sakai for a lung nodule tracked for the last 3 years. She had a biopsy done on 11/4 by Dr. Lamonte Sakai, then had a PET scan done on 11/22 which showed a collapsed lung and she went to [place] on 11/23. She continues having some chest pain from the site of the chest tube and SOB with exertion. She has arthritis in her hips and back, but otherwise no other pains. She lost about 15 lbs after getting all her teeth pulled. She denies having N/V/D, constipation, headaches or vision changes. She denies having any metal hardware in her body.  She continues smoking 1/2 PPD for 47 years. She lives at home with her husband and brother. She is able to do all of her ADL's and chores. Her brother had esophageal cancer; paternal cousin with esophageal cancer.  MEDICAL HISTORY:  Past Medical History:  Diagnosis Date  . Anxiety   . Arteriosclerotic cardiovascular disease (ASCVD) 2013   coronary calcification-left main, LAD and CX; small pericardial effusion  . Arthritis    hips  . Chronic back pain   . Chronic bronchitis   . Chronic lung disease    Chronic scarring and volume loss-left lung; characteristics of a chronic infectious process-possible MAI  . Depression   . Dyspnea    occasional   .  GERD (gastroesophageal reflux disease)    Dr Gala Romney EGD 09/2009->esophagitis, sm HH, antral erosions, atonic esophagus  . Hyperlipidemia   . Hypertension   . Hypothyroid 1981 approx  . Scleroderma ([place])   . Seasonal allergies   . Syncope    Multiple spells over the past 40+ years, likely neurocardiogenic  . Tobacco abuse 06/24/2009    SURGICAL HISTORY: Past Surgical History:  Procedure Laterality Date  . BILATERAL SALPINGOOPHORECTOMY  2013    Dr. Elonda Husky; uterus remains in situ  . BRONCHIAL BIOPSY  11/21/2019   Procedure: BRONCHIAL BIOPSIES;  Surgeon: Collene Gobble, MD;  Location: [facility] ENDOSCOPY;  Service: Pulmonary;;  . BRONCHIAL BRUSHINGS  11/21/2019   Procedure: BRONCHIAL BRUSHINGS;  Surgeon: Collene Gobble, MD;  Location: [facility] ENDOSCOPY;  Service: Pulmonary;;  . BRONCHIAL NEEDLE ASPIRATION BIOPSY  11/21/2019   Procedure: BRONCHIAL NEEDLE ASPIRATION BIOPSIES;  Surgeon: Collene Gobble, MD;  Location: [place];  Service: Pulmonary;;  . BRONCHIAL WASHINGS  11/21/2019   Procedure: BRONCHIAL WASHINGS;  Surgeon: Collene Gobble, MD;  Location: [facility] ENDOSCOPY;  Service: Pulmonary;;  . COLONOSCOPY  09/28/09   anal papilla otherwise normal  . COLONOSCOPY N/A 08/29/2012   FTD:[id number] polyp-removed as described above. tubular adenoma  . COLONOSCOPY WITH PROPOFOL N/A 11/11/2019   Procedure: COLONOSCOPY WITH PROPOFOL;  Surgeon: Daneil Dolin, MD;  [facility] ENDO SU[facility];  Service: Endoscopy;  Laterality: N/A;  2:30pm  . ESOPHAGOGASTRODUODENOSCOPY  10/07/09   Dr. Barrie Dunker of esophageal mucosa, diffusely ?esophagitis (  bx benign), small HH/antral erosions, erythema bx benign. 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 ESOPHAGOGASTRODUODENOSCOPY (EGD) WITH ESOPHAGEAL DILATION N/A 03/07/2012   [id number], patent, tubular esophagus of uncertain significance-status post biopsy )unremarkable). Hiatal hernia  . FUDUCIAL PLACEMENT  10/18/2017   Procedure: PLACEMENT OF 3 FUDUCIAL INTO LEFT LOWER  LOBE OF LUNG-TARGET 1;  Surgeon: Collene Gobble, MD;  Location: [place];  Service: Thoracic;;  . LAPAROSCOPIC APPENDECTOMY  07/16/2011   Procedure: APPENDECTOMY LAPAROSCOPIC;  Surgeon: Donato Heinz, MD;  Location: [facility] ORS;  Service: General;  Laterality: N/A;  . POLYPECTOMY  11/11/2019   Procedure: POLYPECTOMY;  Surgeon: Daneil Dolin, MD;  Location: [facility] ENDO [facility]ITE;  Service: Endoscopy;;  . TUBAL LIGATION    . VIDEO BRONCHOSCOPY  10/18/2017  . VIDEO BRONCHOSCOPY WITH ENDOBRONCHIAL NAVIGATION N/A 10/18/2017   Procedure: VIDEO BRONCHOSCOPY WITH ENDOBRONCHIAL NAVIGATION;  Surgeon: Collene Gobble, MD;  [facility]: [facility] OR;  Service: Thoracic;  Laterality: N/A;  . VIDEO BRONCHOSCOPY WITH ENDOBRONCHIAL NAVIGATION Left 11/21/2019   Procedure: VIDEO BRONCHOSCOPY WITH ENDOBRONCHIAL NAVIGATION;  Surgeon: Collene Gobble, MD;  Location: [place] ENDOSCOPY;  Service: Pulmonary;  Laterality: Left;    SOCIAL HISTORY: Social History   Socioeconomic History  . Marital status: Married    Spouse name: Not on file  . Number of children: 2  . Years of education: Not on file  . Highest education level: Not on file  Occupational History  . Occupation: disabled    Fish farm manager: NOT EMPLOYED  Tobacco Use  . Smoking status: Current Every Day Smoker    Packs/day: 0.50    Years: 45.00    Pack years: 22.50    Types: Cigarettes    Start date: 07/14/1971  . Smokeless tobacco: Never Used  . Tobacco comment: 1 PPD current plans to reduce after surgery on 11/21/19  Vaping Use  . Vaping Use: Never used  Substance and Sexual Activity  . Alcohol use: Not Currently    Alcohol/week: 0.0 standard drinks    Comment: quit June 2012-used to drink 6 pack daily, occasional beer per pt 11/20/19  . Drug use: No  . Sexual activity: Yes    Birth control/protection: Post-menopausal  Other Topics Concern  . Not on file  Social History Narrative  . Not on file   Social Determinants of Health   Financial Resource Strain: Low Risk   .  Difficulty of Paying Living Expenses: Not hard at all  Food Insecurity: No Food Insecurity  . Worried About Charity fundraiser in the Last Year: Never true  . Ran Out of Food in the Last Year: Never true  Transportation Needs: No Transportation Needs  . Lack of Transportation (Medical): No  . Lack of Transportation (Non-Medical): No  Physical Activity: Insufficiently Active  . Days of Exercise per Week: 1 day  . Minutes of Exercise per Session: 10 min  Stress: No Stress Concern Present  . Feeling of Stress : Only a little  Social Connections: Moderately Integrated  . Frequency of Communication with Friends and Family: More than three times a week  . Frequency of Social Gatherings with Friends and Family: More than three times a week  . Attends Religious Services: 1 to 4 times per year  . Active Member of Clubs or Organizations: No  . Attends Archivist Meetings: Never  . Marital Status: Married  Human resources officer Violence: Not At Risk  . Fear of Current or Ex-Partner: No  . Emotionally Abused: No  . Physically  Abused: No  . Sexually Abused: No    FAMILY HISTORY: Family History  Problem Relation Age of Onset  . Stroke Father   . Aneurysm Father   . Coronary artery disease Brother   . Hypertension Brother   . Down syndrome Brother   . Hypertension Brother   . Hypertension Brother        father/mother  . Aneurysm Mother   . Throat cancer Brother 49       throat cancer  . Hypertension Sister   . Diabetes Brother   . Diabetes Brother   . Anesthesia problems Neg Hx   . Hypotension Neg Hx   . Malignant hyperthermia Neg Hx   . Pseudochol deficiency Neg Hx   . Colon cancer Neg Hx     ALLERGIES:  is allergic to aspirin.  MEDICATIONS:  Current Outpatient Medications  Medication Sig Dispense Refill  . amLODipine (NORVASC) 5 MG tablet Take 1 tablet (5 mg total) by mouth daily. 90 tablet 3  . atorvastatin (LIPITOR) 10 MG tablet Take 1 tablet (10 mg total) by mouth  daily. 30 tablet 5  . Cholecalciferol (VITAMIN D) 125 MCG (5000 UT) CAPS Take 5,000 Units by mouth daily.     . DULoxetine (CYMBALTA) 60 MG capsule Take 1 capsule (60 mg total) by mouth daily.    Marland Kitchen levothyroxine (SYNTHROID) 75 MCG tablet Take 0.5-1 tablets (37.5-75 mcg total) by mouth See admin instructions. Take 75 mcg by mouth every other day before breakfast, alternating with 37.5 mcg    . Magnesium 500 MG CAPS Take 1 capsule by mouth daily.     . montelukast (SINGULAIR) 10 MG tablet Take 1 tablet (10 mg total) by mouth at bedtime. 90 tablet 1  . pantoprazole (PROTONIX) 40 MG tablet TAKE 1 TABLET TWICE DAILY (Patient taking differently: Take 40 mg by mouth 2 (two) times daily. ) 180 tablet 0  . potassium chloride (KLOR-CON 10) 10 MEQ tablet Take 1 tablet (10 mEq total) by mouth daily. 90 tablet 3  . acetaminophen (TYLENOL) 650 MG CR tablet Take 1,300 mg by mouth every 8 (eight) hours as needed for pain.  (Patient not taking: Reported on 12/19/2019)    . guaiFENesin-dextromethorphan (ROBITUSSIN DM) 100-10 MG/5ML syrup Take 5 mLs by mouth every 4 (four) hours as needed for cough. (Patient not taking: Reported on 12/19/2019) 118 mL 0  . hydrOXYzine (ATARAX/VISTARIL) 25 MG tablet Take 1 tablet (25 mg total) by mouth 3 (three) times daily as needed for anxiety or itching. (Patient not taking: Reported on 12/19/2019) 30 tablet 0  . Lidocaine-Menthol (ICY HOT LIDOCAINE PLUS MENTHOL) 4-1 % PTCH Apply 1 patch topically daily as needed (pain).  (Patient not taking: Reported on 12/19/2019)    . oxyCODONE (OXY IR/ROXICODONE) 5 MG immediate release tablet Take 1 tablet (5 mg total) by mouth every 4 (four) hours as needed for moderate pain. (Patient not taking: Reported on 12/19/2019) 30 tablet 0  . PROAIR RESPICLICK 956 (90 Base) MCG/ACT AEPB Inhale 2 puffs into the lungs 4 (four) times daily as needed. (Patient not taking: Reported on 12/19/2019) 1 each 5   No current facility-administered medications for this  visit.    REVIEW OF SYSTEMS:   Review of Systems  Constitutional: Positive for appetite change (50%) and fatigue (depleted).  Respiratory: Positive for shortness of breath (w/ exertion).   Cardiovascular: Positive for chest pain (L chest wall pain from chest tube site).  Musculoskeletal: Positive for arthralgias (hips & back pain d/t  arthritis).  All other systems reviewed and are negative.    PHYSICAL EXAMINATION: ECOG PERFORMANCE STATUS: 1 - Symptomatic but completely ambulatory  Vitals:   12/19/19 1345  BP: 129/78  Pulse: 99  Resp: 18  Temp: 98.7 F (37.1 C)  SpO2: 98%   Filed Weights   12/19/19 1345  Weight: 140 lb (63.5 kg)   Physical Exam Vitals reviewed.  Constitutional:      Appearance: Normal appearance.  Cardiovascular:     Rate and Rhythm: Normal rate and regular rhythm.     Pulses: Normal pulses.     Heart sounds: Normal heart sounds.  Pulmonary:     Effort: Pulmonary effort is normal.     Breath sounds: Normal breath sounds.  Abdominal:     Palpations: Abdomen is soft. There is no mass.  Neurological:     General: No focal deficit present.     Mental Status: She is alert and oriented to person, place, and time.  Psychiatric:        Mood and Affect: Mood normal.        Behavior: Behavior normal.      LABORATORY DATA:  I have reviewed the data as listed CBC Latest Ref Rng & Units 12/12/2019 12/10/2019 11/21/2019  WBC 4.0 - 10.5 K/uL 9.5 10.3 10.5  Hemoglobin 12.0 - 15.0 g/dL 11.2(L) 12.0 11.6(L)  Hematocrit 36 - 46 % 34.9(L) 37.5 35.8(L)  Platelets 150 - 400 K/uL 427(H) 452(H) 318   CMP Latest Ref Rng & Units 12/12/2019 12/10/2019 11/21/2019  Glucose 70 - 99 mg/dL 99 98 83  BUN 8 - 23 mg/dL <5(L) 5(L) 6(L)  Creatinine 0.44 - 1.00 mg/dL 0.87 0.82 0.76  Sodium 135 - 145 mmol/L 139 139 139  Potassium 3.5 - 5.1 mmol/L 3.7 3.2(L) 2.9(L)  Chloride 98 - 111 mmol/L 100 102 99  CO2 22 - 32 mmol/L 27 26 27   Calcium 8.9 - 10.3 mg/dL 8.9 9.0 9.0   Total Protein 6.5 - 8.1 g/dL - 7.3 -  Total Bilirubin 0.3 - 1.2 mg/dL - 0.5 -  Alkaline Phos 38 - 126 U/L - 89 -  AST 15 - 41 U/L - 25 -  ALT 0 - 44 U/L - 15 -   Cytology ([id number]) on 11/21/2019: Left lower lung lobe brushing and FNA: adenocarcinoma.  RADIOGRAPHIC STUDIES: I have personally reviewed the radiological images as listed and agreed with the findings in the report. NM PET Image Restage (PS) Skull Base to Thigh  Addendum Date: 12/10/2019   ADDENDUM REPORT: 12/10/2019 13:17 ADDENDUM: The original report was by Dr. Van Clines. The following addendum is by Dr. Van Clines: Critical Value/emergent results were called by telephone at the time of interpretation on 12/10/2019 at 1:02 pm to provider Dr. Baltazar Apo , who verbally acknowledged these results. Electronically Signed   By: Van Clines M.D.   On: 12/10/2019 13:17   Result Date: 12/10/2019 CLINICAL DATA:  Initial treatment strategy for non-small cell lung cancer. EXAM: NUCLEAR MEDICINE PET SKULL BASE TO THIGH TECHNIQUE: 10.2 mCi F-18 FDG was injected intravenously. Full-ring PET imaging was performed from the skull base to thigh after the radiotracer. CT data was obtained and used for attenuation correction and anatomic localization. Fasting blood glucose: 98 mg/dl COMPARISON:  Multiple exams, including CT chest 08/05/2019 and PET-CT from 10/02/2017 FINDINGS: Mediastinal blood pool activity: SUV max 2.7 Liver activity: SUV max NA NECK: No significant abnormal hypermetabolic activity in this region. Incidental CT findings: Bilateral common carotid atherosclerotic  calcification. CHEST: Left-sided pneumothorax occupying about 50% of left hemithoracic activity. This causes some misregistration of the PET and CT data, but the lesion of concern is believed to have a maximum SUV of about 4.7, suspicious for malignancy. The scar-like banding at the left lung apex has a maximum SUV of about 2.8, and is more likely to  be inflammatory. Incidental CT findings: Left anterior descending coronary artery atherosclerosis and aortic atherosclerosis. Fiducials noted in the left lung. There is some tethering of the left lung apex to the pleura due to scarring. ABDOMEN/PELVIS: No significant abnormal hypermetabolic activity in this region. Incidental CT findings: Aortoiliac atherosclerotic vascular disease. SKELETON: No significant abnormal hypermetabolic activity in this region. Incidental CT findings: Grade 1 degenerative anterolisthesis at L4-5. Lumbar spondylosis and degenerative disc disease at L5-S1. IMPRESSION: 1. The 2 cm left lower lobe pulmonary nodule has sub solid elements has a maximum SUV of 4.7, suspicious for malignancy. No compelling findings of distant metastatic spread. 2. 50% left-sided pneumothorax.  No shift of mediastinal structures. 3.  Aortic Atherosclerosis (ICD10-I70.0).  Coronary atherosclerosis. 4. Lower lumbar spondylosis and degenerative disc disease. Radiology assistant personnel have been notified to put me in telephone contact with the referring physician or the referring physician's clinical representative in order to discuss these findings. Once this communication is established I will issue an addendum to this report for documentation purposes. Electronically Signed: By: Van Clines M.D. On: 12/10/2019 13:00   DG CHEST PORT 1 VIEW  Result Date: 12/12/2019 CLINICAL DATA:  Chest tube removal EXAM: PORTABLE CHEST 1 VIEW COMPARISON:  December 11, 2019, December 09, 2019 FINDINGS: The cardiomediastinal silhouette is unchanged in contour.Tortuous thoracic aorta. Removal of LEFT-sided chest tube. Trace LEFT pleural effusion. No significant pneumothorax. LEFT basilar atelectasis. LEFT-sided pulmonary nodules of the lung base and apex are better assessed on recent PET-CT. Visualized abdomen is unremarkable. Dextrocurvature of the lower thoracic spine. IMPRESSION: 1. Removal of LEFT-sided chest tube.  No significant pneumothorax. Electronically Signed   By: Valentino Saxon MD   On: 12/12/2019 11:29   Portable chest 1 View  Result Date: 12/11/2019 CLINICAL DATA:  Pneumothorax. Left sided chest tube in place. Chest soreness. EXAM: PORTABLE CHEST 1 VIEW COMPARISON:  One-view chest x-ray 12/10/2019 FINDINGS: Left-sided pigtail catheter is stable in position. There is some fluid or pleural thickening at the left apex. No definite pneumothorax remains. Right lung is clear. Chronic interstitial coarsening noted. Heart is enlarged. Atherosclerotic changes are present. IMPRESSION: 1. Stable left-sided pigtail catheter. 2. Residual fluid or pleural thickening at the left apex. 3. No definite pneumothorax. Electronically Signed   By: San Morelle M.D.   On: 12/11/2019 07:49   DG Chest Port 1 View  Result Date: 12/10/2019 CLINICAL DATA:  Follow-up pneumothorax EXAM: PORTABLE CHEST 1 VIEW COMPARISON:  09/21/2019.  PET CT 12/09/2019 FINDINGS: Moderate to large left pneumothorax again noted. This is likely similar or slightly smaller than prior study. Right lung clear. Areas of atelectasis in the left base. Heart is normal size. No acute bony abnormality. IMPRESSION: Moderate to large left pneumothorax again noted, stable or minimally decreased since prior study. Left base atelectasis Critical Value/emergent results were called by telephone at the time of interpretation on 12/10/2019 at 3:22 pm to provider [facility] , who verbally acknowledged these results. Electronically Signed   By: Rolm Baptise M.D.   On: 12/10/2019 15:22   DG Chest Port 1 View  Result Date: 11/21/2019 CLINICAL DATA:  Post bronchoscopy and biopsy left lower  lobe nodule. EXAM: PORTABLE CHEST 1 VIEW COMPARISON:  03/28/2018 FINDINGS: No pneumothorax or effusion post bronchoscopy. Volume loss the left lung with surgical clips in the left lung base unchanged. There is hyperinflation of the right lung due to chronic lung disease. Mild  airspace disease in the left lung best seen on recent CT. IMPRESSION: No complication post left lower lobe nodule biopsy via bronchoscopy. Electronically Signed   By: Franchot Gallo M.D.   On: 11/21/2019 11:27   DG Chest Port 1V same Day  Result Date: 12/10/2019 CLINICAL DATA:  Chest tube placement EXAM: PORTABLE CHEST 1 VIEW COMPARISON:  Earlier same day FINDINGS: Interval placement of left apically directed chest tube. Residual small apical pneumothorax. Volume loss in the left thorax. Nodular opacity of the lower left lung corresponding to hypermetabolic mass on PET-CT. Right lung is clear. Normal heart size. IMPRESSION: Interval chest tube placement with residual small left apical pneumothorax. Electronically Signed   By: Macy Mis M.D.   On: 12/10/2019 16:16   DG C-ARM BRONCHOSCOPY  Result Date: 11/21/2019 C-ARM BRONCHOSCOPY: Fluoroscopy was utilized by the requesting physician.  No radiographic interpretation.    ASSESSMENT:  1.  Adenocarcinoma of left lower lobe of lung: -She is followed by Dr. Lamonte Sakai and left lower lobe lung nodule was being watched closely for the last few years. -She had increase in size of the left lower lobe lung nodule recently on CT scan. -PET scan on 12/09/2019 showed 2 cm left lower lobe pulmonary nodule with some solid elements with maximum SUV 4.7.  No adenopathy reported. -Bronchoscopy and biopsy of the left lower lobe nodule consistent with adenocarcinoma.  Left lower lobe brushings also positive for adenocarcinoma.  Left upper lobe brushings did not show any malignant cells.  2.  Social/family history: -Half pack per day smoker for the past 34 years. -Lives with husband and brother at home.  Activity is limited secondary to back and hip pain. -Brother had throat cancer and paternal cousin had throat cancer.    PLAN:  1.  Clinical stage I adenocarcinoma of left lower lobe of lung: -I have reviewed images of the PET scan with the patient. -I also  discussed results of the biopsy with the patient. -Recommend MRI with and without gadolinium for staging. -Recommend pulmonary function testing. -We will make a referral to Dr. Roxan Hockey for evaluation for surgical resection. -RTC 4 to 6 weeks after surgery.    All questions were answered. The patient knows to call the clinic with any problems, questions or concerns.   Derek Jack, MD, 12/19/19 2:11 PM  [place] [phone]   I, Milinda Antis, am acting as a scribe for Dr. Sanda Linger.  I, Derek Jack MD, have reviewed the above documentation for accuracy and completeness, and I agree with the above.

## 2019-12-19 NOTE — Patient Instructions (Signed)
[place] at [facility] Discharge Instructions  You were seen and examined today by Dr. Delton Coombes. Dr. Delton Coombes discussed your past medical history, family history of cancer and the events that led to you being here today.  You have been diagnosed with Stage I Lung Cancer. Dr. Delton Coombes reviewed the results of your recent PET scan. The PET scan revealed that your cancer has not spread to any distant organs, it is recommended that you speak to a cardiothoracic surgeon about removing the cancer. Surgically removing the cancer is the best and preferred method of treatment. For the size of your tumor, it is unlikely that you will need any chemotherapy following surgery.  We will arrange for you to have a MRI of your brain and pulmonary function tests. This will confirm staging and confirm that you are a surgical candidate. We will also arrange for you to see the surgeon, Dr. Roxan Hockey.  We will see you back in 6 weeks.   Thank you for choosing [place] at [facility] to provide your oncology and hematology care.  To afford each patient quality time with our provider, please arrive at least 15 minutes before your scheduled appointment time.   If you have a lab appointment with the [facility] please come in thru the Main Entrance and check in at the main information desk.  You need to re-schedule your appointment should you arrive 10 or more minutes late.  We strive to give you quality time with our providers, and arriving late affects you and other patients whose appointments are after yours.  Also, if you no show three or more times for appointments you may be dismissed from the clinic at the providers discretion.     Again, thank you for choosing [facility].  Our hope is that these requests will decrease the amount of time that you wait before being seen by our physicians.        _____________________________________________________________  Should you have questions after your visit to [facility], please contact our office at [phone] and follow the prompts.  Our office hours are 8:00 a.m. and 4:30 p.m. Monday - Friday.  Please note that voicemails left after 4:00 p.m. may not be returned until the following business day.  We are closed weekends and major holidays.  You do have access to a nurse 24-7, just call the main number to the clinic [phone] and do not press any options, hold on the line and a nurse will answer the phone.    For prescription refill requests, have your pharmacy contact our office and allow 72 hours.    Due to Covid, you will need to wear a mask upon entering the hospital. If you do not have a mask, a mask will be given to you at the Main Entrance upon arrival. For doctor visits, patients may have 1 support person age 1 or older with them. For treatment visits, patients can not have anyone with them due to social distancing guidelines and our immunocompromised population.

## 2019-12-20 ENCOUNTER — Ambulatory Visit (INDEPENDENT_AMBULATORY_CARE_PROVIDER_SITE_OTHER): Payer: Medicare HMO | Admitting: Nurse Practitioner

## 2019-12-20 ENCOUNTER — Other Ambulatory Visit (HOSPITAL_COMMUNITY)
Admission: RE | Admit: 2019-12-20 | Discharge: 2019-12-20 | Disposition: A | Discharge status: Home / Self Care | Payer: Medicare HMO | Source: Ambulatory Visit | Attending: Nurse Practitioner | Admitting: Nurse Practitioner

## 2019-12-20 ENCOUNTER — Encounter: Payer: Self-pay | Admitting: Nurse Practitioner

## 2019-12-20 VITALS — BP 130/79 | HR 99 | Temp 98.8°F | Resp 20 | Ht 68.0 in | Wt 140.0 lb

## 2019-12-20 DIAGNOSIS — J939 Pneumothorax, unspecified: Secondary | ICD-10-CM

## 2019-12-20 LAB — CBC WITH DIFFERENTIAL/PLATELET
Abs Immature Granulocytes: 0.03 10*3/uL (ref 0.00–0.07)
Basophils Absolute: 0 10*3/uL (ref 0.0–0.1)
Basophils Relative: 0 %
Eosinophils Absolute: 0.2 10*3/uL (ref 0.0–0.5)
Eosinophils Relative: 2 %
HCT: 35.9 % — ABNORMAL LOW (ref 36.0–46.0)
Hemoglobin: 11.7 g/dL — ABNORMAL LOW (ref 12.0–15.0)
Immature Granulocytes: 0 %
Lymphocytes Relative: 41 %
Lymphs Abs: 4 10*3/uL (ref 0.7–4.0)
MCH: 26.4 pg (ref 26.0–34.0)
MCHC: 32.6 g/dL (ref 30.0–36.0)
MCV: 81 fL (ref 80.0–100.0)
Monocytes Absolute: 0.7 10*3/uL (ref 0.1–1.0)
Monocytes Relative: 7 %
Neutro Abs: 4.8 10*3/uL (ref 1.7–7.7)
Neutrophils Relative %: 50 %
Platelets: 395 10*3/uL (ref 150–400)
RBC: 4.43 MIL/uL (ref 3.87–5.11)
RDW: 14.6 % (ref 11.5–15.5)
WBC: 9.7 10*3/uL (ref 4.0–10.5)
nRBC: 0 % (ref 0.0–0.2)

## 2019-12-20 LAB — BASIC METABOLIC PANEL
Anion gap: 10 (ref 5–15)
BUN: 9 mg/dL (ref 8–23)
CO2: 26 mmol/L (ref 22–32)
Calcium: 9 mg/dL (ref 8.9–10.3)
Chloride: 102 mmol/L (ref 98–111)
Creatinine, Ser: 0.78 mg/dL (ref 0.44–1.00)
GFR, Estimated: >60 mL/min (ref >60)
Glucose, Bld: 91 mg/dL (ref 70–99)
Potassium: 3.8 mmol/L (ref 3.5–5.1)
Sodium: 138 mmol/L (ref 135–145)

## 2019-12-20 NOTE — Progress Notes (Signed)
Called patient Kelly Douglas. Asked patient to call her Dr.'s office since her PFT is [facility] on Monday. I also told her if she had any questions to call me.

## 2019-12-20 NOTE — Patient Instructions (Signed)
You are breathing much better now that the collapsed lung has been fixed.  The incision site is looking great.  Follow-up with Dr. Lamonte Sakai next week and with Dr. Moshe Cipro on 01/07/20.

## 2019-12-20 NOTE — Progress Notes (Signed)
Established Patient Office Visit  Subjective:  Patient ID: Kelly Douglas, female    DOB: [birth year]  Age: 64 y.o. MRN: [id number]  CC:  Chief Complaint  Patient presents with  . Shortness of Breath    HPI Kelly Douglas presents for hospital follow-up.  She was admitted to [facility] hospital for chest tightness and SOB from 12/10/19-12/12/19.  She has a history of lung cancer that she is still having worked up.  At the hospital, she was determined to have a large left pneumothorax, which resolved after chest tube placement.  She had biopsy on 11/21/19. At time of discharge, the pneumothorax had resolved.   Today, she states that she is breathing much better.  Her site of her chest tube insertion is still sore.  Past Medical History:  Diagnosis Date  . Anxiety   . Arteriosclerotic cardiovascular disease (ASCVD) 2013   coronary calcification-left main, LAD and CX; small pericardial effusion  . Arthritis    hips  . Chronic back pain   . Chronic bronchitis   . Chronic lung disease    Chronic scarring and volume loss-left lung; characteristics of a chronic infectious process-possible MAI  . Depression   . Dyspnea    occasional   . GERD (gastroesophageal reflux disease)    Dr Gala Romney EGD 09/2009->esophagitis, sm HH, antral erosions, atonic esophagus  . Hyperlipidemia   . Hypertension   . Hypothyroid 1981 approx  . Scleroderma ([place])   . Seasonal allergies   . Syncope    Multiple spells over the past 40+ years, likely neurocardiogenic  . Tobacco abuse 06/24/2009    Past Surgical History:  Procedure Laterality Date  . BILATERAL SALPINGOOPHORECTOMY  2013    Dr. Elonda Husky; uterus remains in situ  . BRONCHIAL BIOPSY  11/21/2019   Procedure: BRONCHIAL BIOPSIES;  Surgeon: Collene Gobble, MD;  Location: [facility] ENDOSCOPY;  Service: Pulmonary;;  . BRONCHIAL BRUSHINGS  11/21/2019   Procedure: BRONCHIAL BRUSHINGS;  Surgeon: Collene Gobble, MD;  Location: [facility] ENDOSCOPY;  Service: Pulmonary;;  .  BRONCHIAL NEEDLE ASPIRATION BIOPSY  11/21/2019   Procedure: BRONCHIAL NEEDLE ASPIRATION BIOPSIES;  Surgeon: Collene Gobble, MD;  Location: [place];  Service: Pulmonary;;  . BRONCHIAL WASHINGS  11/21/2019   Procedure: BRONCHIAL WASHINGS;  Surgeon: Collene Gobble, MD;  Location: [facility] ENDOSCOPY;  Service: Pulmonary;;  . COLONOSCOPY  09/28/09   anal papilla otherwise normal  . COLONOSCOPY N/A 08/29/2012   XFG:[id number] polyp-removed as described above. tubular adenoma  . COLONOSCOPY WITH PROPOFOL N/A 11/11/2019   Procedure: COLONOSCOPY WITH PROPOFOL;  Surgeon: Daneil Dolin, MD;  Location: [facility];  Service: Endoscopy;  Laterality: N/A;  2:30pm  . ESOPHAGOGASTRODUODENOSCOPY  10/07/09   Dr. Barrie Dunker of esophageal mucosa, diffusely ?esophagitis (bx benign), small HH/antral erosions, erythema bx benign. 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 ESOPHAGOGASTRODUODENOSCOPY (EGD) WITH ESOPHAGEAL DILATION N/A 03/07/2012   JIR:[id number], patent, tubular esophagus of uncertain significance-status post biopsy )unremarkable). Hiatal hernia  . FUDUCIAL PLACEMENT  10/18/2017   Procedure: PLACEMENT OF 3 FUDUCIAL INTO LEFT LOWER LOBE OF LUNG-TARGET 1;  Surgeon: Collene Gobble, MD;  Location: [place];  Service: Thoracic;;  . LAPAROSCOPIC APPENDECTOMY  07/16/2011   Procedure: APPENDECTOMY LAPAROSCOPIC;  Surgeon: Donato Heinz, MD;  Location: [facility] ORS;  Service: General;  Laterality: N/A;  . POLYPECTOMY  11/11/2019   Procedure: POLYPECTOMY;  Surgeon: Daneil Dolin, MD;  Location: [facility];  Service: Endoscopy;;  . TUBAL LIGATION    . VIDEO  BRONCHOSCOPY  10/18/2017  . VIDEO BRONCHOSCOPY WITH ENDOBRONCHIAL NAVIGATION N/A 10/18/2017   Procedure: VIDEO BRONCHOSCOPY WITH ENDOBRONCHIAL NAVIGATION;  Surgeon: Collene Gobble, MD;  Location: [facility] OR;  Service: Thoracic;  Laterality: N/A;  . VIDEO BRONCHOSCOPY WITH ENDOBRONCHIAL NAVIGATION Left 11/21/2019   Procedure: VIDEO BRONCHOSCOPY WITH ENDOBRONCHIAL  NAVIGATION;  Surgeon: Collene Gobble, MD;  Location: [place] ENDOSCOPY;  Service: Pulmonary;  Laterality: Left;    Family History  Problem Relation Age of Onset  . Stroke Father   . Aneurysm Father   . Coronary artery disease Brother   . Hypertension Brother   . Down syndrome Brother   . Hypertension Brother   . Hypertension Brother        father/mother  . Aneurysm Mother   . Throat cancer Brother 49       throat cancer  . Hypertension Sister   . Diabetes Brother   . Diabetes Brother   . Anesthesia problems Neg Hx   . Hypotension Neg Hx   . Malignant hyperthermia Neg Hx   . Pseudochol deficiency Neg Hx   . Colon cancer Neg Hx     Social History   Socioeconomic History  . Marital status: Married    Spouse name: Not on file  . Number of children: 2  . Years of education: Not on file  . Highest education level: Not on file  Occupational History  . Occupation: disabled    Fish farm manager: NOT EMPLOYED  Tobacco Use  . Smoking status: Current Every Day Smoker    Packs/day: 0.50    Years: 45.00    Pack years: 22.50    Types: Cigarettes    Start date: 07/14/1971  . Smokeless tobacco: Never Used  . Tobacco comment: 1 PPD current plans to reduce after surgery on 11/21/19  Vaping Use  . Vaping Use: Never used  Substance and Sexual Activity  . Alcohol use: Not Currently    Alcohol/week: 0.0 standard drinks    Comment: quit June 2012-used to drink 6 pack daily, occasional beer per pt 11/20/19  . Drug use: No  . Sexual activity: Yes    Birth control/protection: Post-menopausal  Other Topics Concern  . Not on file  Social History Narrative  . Not on file   Social Determinants of Health   Financial Resource Strain: Low Risk   . Difficulty of Paying Living Expenses: Not hard at all  Food Insecurity: No Food Insecurity  . Worried About Charity fundraiser in the Last Year: Never true  . Ran Out of Food in the Last Year: Never true  Transportation Needs: No Transportation Needs  .  Lack of Transportation (Medical): No  . Lack of Transportation (Non-Medical): No  Physical Activity: Insufficiently Active  . Days of Exercise per Week: 1 day  . Minutes of Exercise per Session: 10 min  Stress: No Stress Concern Present  . Feeling of Stress : Only a little  Social Connections: Moderately Integrated  . Frequency of Communication with Friends and Family: More than three times a week  . Frequency of Social Gatherings with Friends and Family: More than three times a week  . Attends Religious Services: 1 to 4 times per year  . Active Member of Clubs or Organizations: No  . Attends Archivist Meetings: Never  . Marital Status: Married  Human resources officer Violence: Not At Risk  . Fear of Current or Ex-Partner: No  . Emotionally Abused: No  . Physically Abused: No  . Sexually  Abused: No    Outpatient Medications Prior to Visit  Medication Sig Dispense Refill  . acetaminophen (TYLENOL) 650 MG CR tablet Take 1,300 mg by mouth every 8 (eight) hours as needed for pain.     Marland Kitchen amLODipine (NORVASC) 5 MG tablet Take 1 tablet (5 mg total) by mouth daily. 90 tablet 3  . atorvastatin (LIPITOR) 10 MG tablet Take 1 tablet (10 mg total) by mouth daily. 30 tablet 5  . Cholecalciferol (VITAMIN D) 125 MCG (5000 UT) CAPS Take 5,000 Units by mouth daily.     . DULoxetine (CYMBALTA) 60 MG capsule Take 1 capsule (60 mg total) by mouth daily.    Marland Kitchen guaiFENesin-dextromethorphan (ROBITUSSIN DM) 100-10 MG/5ML syrup Take 5 mLs by mouth every 4 (four) hours as needed for cough. 118 mL 0  . hydrOXYzine (ATARAX/VISTARIL) 25 MG tablet Take 1 tablet (25 mg total) by mouth 3 (three) times daily as needed for anxiety or itching. 30 tablet 0  . levothyroxine (SYNTHROID) 75 MCG tablet Take 0.5-1 tablets (37.5-75 mcg total) by mouth See admin instructions. Take 75 mcg by mouth every other day before breakfast, alternating with 37.5 mcg    . Lidocaine-Menthol (ICY HOT LIDOCAINE PLUS MENTHOL) 4-1 % PTCH  Apply 1 patch topically daily as needed (pain).     . Magnesium 500 MG CAPS Take 1 capsule by mouth daily.     . montelukast (SINGULAIR) 10 MG tablet Take 1 tablet (10 mg total) by mouth at bedtime. 90 tablet 1  . oxyCODONE (OXY IR/ROXICODONE) 5 MG immediate release tablet Take 1 tablet (5 mg total) by mouth every 4 (four) hours as needed for moderate pain. 30 tablet 0  . pantoprazole (PROTONIX) 40 MG tablet TAKE 1 TABLET TWICE DAILY (Patient taking differently: Take 40 mg by mouth 2 (two) times daily. ) 180 tablet 0  . potassium chloride (KLOR-CON 10) 10 MEQ tablet Take 1 tablet (10 mEq total) by mouth daily. 90 tablet 3  . PROAIR RESPICLICK 509 (90 Base) MCG/ACT AEPB Inhale 2 puffs into the lungs 4 (four) times daily as needed. 1 each 5   No facility-administered medications prior to visit.    Allergies  Allergen Reactions  . Aspirin Other (See Comments)    Nose bleeds    ROS Review of Systems  Constitutional: Negative.   Respiratory: Negative.   Cardiovascular: Negative.   Psychiatric/Behavioral: Negative.       Objective:    Physical Exam Constitutional:      Appearance: She is normal weight.  Pulmonary:     Effort: Pulmonary effort is normal. No tachypnea, bradypnea, accessory muscle usage or respiratory distress.     Breath sounds: No decreased breath sounds, wheezing, rhonchi or rales.  Chest:     Comments: Slight tenderness at incision site; no erythema or purulent drainage; healing well Neurological:     Mental Status: She is alert.     BP 130/79   Pulse 99   Temp 98.8 F (37.1 C)   Resp 20   Ht 5\' 8"  (1.727 m)   Wt 140 lb (63.5 kg)   LMP  (LMP Unknown)   SpO2 95%   BMI 21.29 kg/m  Wt Readings from Last 3 Encounters:  12/20/19 140 lb (63.5 kg)  12/19/19 140 lb (63.5 kg)  12/11/19 139 lb 1.8 oz (63.1 kg)     Health Maintenance Due  Topic Date Due  . COVID-19 Vaccine (1) Never done  . PAP SMEAR-Modifier  11/29/2019    There  are no preventive  care reminders to display for this patient.  Lab Results  Component Value Date   TSH 1.410 09/20/2019   Lab Results  Component Value Date   WBC 9.5 12/12/2019   HGB 11.2 (L) 12/12/2019   HCT 34.9 (L) 12/12/2019   MCV 80.8 12/12/2019   PLT 427 (H) 12/12/2019   Lab Results  Component Value Date   NA 139 12/12/2019   K 3.7 12/12/2019   CO2 27 12/12/2019   GLUCOSE 99 12/12/2019   BUN <5 (L) 12/12/2019   CREATININE 0.87 12/12/2019   BILITOT 0.5 12/10/2019   ALKPHOS 89 12/10/2019   AST 25 12/10/2019   ALT 15 12/10/2019   PROT 7.3 12/10/2019   ALBUMIN 3.8 12/10/2019   CALCIUM 8.9 12/12/2019   ANIONGAP 12 12/12/2019   GFR 74.36 11/04/2019   Lab Results  Component Value Date   CHOL 132 09/20/2019   Lab Results  Component Value Date   HDL 31 (L) 09/20/2019   Lab Results  Component Value Date   LDLCALC 42 09/20/2019   Lab Results  Component Value Date   TRIG 398 (H) 09/20/2019   Lab Results  Component Value Date   CHOLHDL 4.3 09/20/2019   Lab Results  Component Value Date   HGBA1C 5.1 10/27/2015      Assessment & Plan:   Problem List Items Addressed This Visit      Respiratory   Pneumothorax on left    -left pneumothorax was resolved at time of discharge -the incision sites are healing well, no signs of infection -she has f/u with Dr. Lamonte Sakai in a week -changed dressing today, non-stick and 4x4s for padding -will check BMP per hospitalist recommendations       Other Visit Diagnoses    Pneumothorax, unspecified type    -  Primary   Relevant Orders   Basic Metabolic Panel (BMET)      No orders of the defined types were placed in this encounter.   Follow-up: Return in about 1 month (around 01/20/2020) for previously scheduled physcial exam.    Noreene Larsson, NP

## 2019-12-20 NOTE — Assessment & Plan Note (Addendum)
-  hospital records reviewed -left pneumothorax was resolved at time of discharge -the incision sites are healing well, no signs of infection -she has f/u with Dr. Lamonte Sakai in a week -changed dressing today, non-stick and 4x4s for padding -will check BMP per hospitalist recommendations

## 2019-12-21 LAB — BASIC METABOLIC PANEL
BUN/Creatinine Ratio: 8 — ABNORMAL LOW (ref 12–28)
BUN: 8 mg/dL (ref 8–27)
CO2: 25 mmol/L (ref 20–29)
Calcium: 10 mg/dL (ref 8.7–10.3)
Chloride: 104 mmol/L (ref 96–106)
Creatinine, Ser: 0.96 mg/dL (ref 0.57–1.00)
GFR calc Af Amer: 72 mL/min/{1.73_m2} (ref >59)
GFR calc non Af Amer: 63 mL/min/{1.73_m2} (ref >59)
Glucose: 82 mg/dL (ref 65–99)
Potassium: 3.8 mmol/L (ref 3.5–5.2)
Sodium: 144 mmol/L (ref 134–144)

## 2019-12-23 ENCOUNTER — Other Ambulatory Visit: Payer: Self-pay

## 2019-12-23 ENCOUNTER — Ambulatory Visit (HOSPITAL_COMMUNITY)
Admission: RE | Admit: 2019-12-23 | Discharge: 2019-12-23 | Disposition: A | Discharge status: Home / Self Care | Payer: Medicare HMO | Source: Ambulatory Visit | Attending: Hematology | Admitting: Hematology

## 2019-12-23 ENCOUNTER — Other Ambulatory Visit (HOSPITAL_COMMUNITY)
Admission: RE | Admit: 2019-12-23 | Discharge: 2019-12-23 | Disposition: A | Discharge status: Home / Self Care | Payer: Medicare HMO | Source: Ambulatory Visit | Attending: Hematology | Admitting: Hematology

## 2019-12-23 DIAGNOSIS — Z20822 Contact with and (suspected) exposure to covid-19: Secondary | ICD-10-CM | POA: Diagnosis not present

## 2019-12-23 DIAGNOSIS — C3490 Malignant neoplasm of unspecified part of unspecified bronchus or lung: Secondary | ICD-10-CM

## 2019-12-23 DIAGNOSIS — Z01812 Encounter for preprocedural laboratory examination: Secondary | ICD-10-CM | POA: Diagnosis not present

## 2019-12-23 DIAGNOSIS — C3492 Malignant neoplasm of unspecified part of left bronchus or lung: Secondary | ICD-10-CM

## 2019-12-23 LAB — SARS CORONAVIRUS 2 (TAT 6-24 HRS): SARS Coronavirus 2: NEGATIVE

## 2019-12-25 ENCOUNTER — Other Ambulatory Visit: Payer: Self-pay

## 2019-12-25 ENCOUNTER — Ambulatory Visit (HOSPITAL_COMMUNITY)
Admission: RE | Admit: 2019-12-25 | Discharge: 2019-12-25 | Disposition: A | Discharge status: Home / Self Care | Payer: Medicare HMO | Source: Ambulatory Visit | Attending: Hematology | Admitting: Hematology

## 2019-12-25 DIAGNOSIS — C3492 Malignant neoplasm of unspecified part of left bronchus or lung: Secondary | ICD-10-CM | POA: Insufficient documentation

## 2019-12-25 DIAGNOSIS — C3490 Malignant neoplasm of unspecified part of unspecified bronchus or lung: Secondary | ICD-10-CM | POA: Insufficient documentation

## 2019-12-25 DIAGNOSIS — J449 Chronic obstructive pulmonary disease, unspecified: Secondary | ICD-10-CM | POA: Insufficient documentation

## 2019-12-25 DIAGNOSIS — F17210 Nicotine dependence, cigarettes, uncomplicated: Secondary | ICD-10-CM | POA: Insufficient documentation

## 2019-12-25 LAB — PULMONARY FUNCTION TEST
DL/VA % pred: 49 %
DL/VA: 2.01 ml/min/mmHg/L
DLCO cor % pred: 43 %
DLCO cor: 9.79 ml/min/mmHg
DLCO unc % pred: 40 %
DLCO unc: 9.23 ml/min/mmHg
FEF 25-75 Post: 1.1 L/sec
FEF 25-75 Pre: 0.81 L/sec
FEF2575-%Change-Post: 36 %
FEF2575-%Pred-Post: 49 %
FEF2575-%Pred-Pre: 36 %
FEV1-%Change-Post: 6 %
FEV1-%Pred-Post: 98 %
FEV1-%Pred-Pre: 93 %
FEV1-Post: 2.33 L
FEV1-Pre: 2.2 L
FEV1FVC-%Change-Post: 4 %
FEV1FVC-%Pred-Pre: 74 %
FEV6-%Change-Post: 4 %
FEV6-%Pred-Post: 123 %
FEV6-%Pred-Pre: 117 %
FEV6-Post: 3.59 L
FEV6-Pre: 3.43 L
FEV6FVC-%Change-Post: 3 %
FEV6FVC-%Pred-Post: 97 %
FEV6FVC-%Pred-Pre: 94 %
FVC-%Change-Post: 1 %
FVC-%Pred-Post: 126 %
FVC-%Pred-Pre: 125 %
FVC-Post: 3.82 L
FVC-Pre: 3.77 L
Post FEV1/FVC ratio: 61 %
Post FEV6/FVC ratio: 94 %
Pre FEV1/FVC ratio: 58 %
Pre FEV6/FVC Ratio: 91 %
RV % pred: 104 %
RV: 2.38 L
TLC % pred: 108 %
TLC: 6.12 L

## 2019-12-25 MED ORDER — ALBUTEROL SULFATE (2.5 MG/3ML) 0.083% IN NEBU
2.5000 mg | INHALATION_SOLUTION | Freq: Once | RESPIRATORY_TRACT | Status: AC
  Administered 2019-12-25: 2.5 mg via RESPIRATORY_TRACT

## 2019-12-31 ENCOUNTER — Ambulatory Visit (INDEPENDENT_AMBULATORY_CARE_PROVIDER_SITE_OTHER): Payer: Medicare HMO

## 2019-12-31 ENCOUNTER — Encounter: Payer: Self-pay | Admitting: Emergency Medicine

## 2019-12-31 ENCOUNTER — Other Ambulatory Visit: Payer: Self-pay

## 2019-12-31 ENCOUNTER — Ambulatory Visit: Payer: Medicare HMO | Admitting: Emergency Medicine

## 2019-12-31 VITALS — BP 122/78 | HR 98 | Temp 98.4°F | Ht 68.0 in | Wt 140.0 lb

## 2019-12-31 DIAGNOSIS — C801 Malignant (primary) neoplasm, unspecified: Secondary | ICD-10-CM

## 2019-12-31 DIAGNOSIS — J939 Pneumothorax, unspecified: Secondary | ICD-10-CM

## 2019-12-31 DIAGNOSIS — F17218 Nicotine dependence, cigarettes, with other nicotine-induced disorders: Secondary | ICD-10-CM | POA: Diagnosis not present

## 2019-12-31 DIAGNOSIS — J189 Pneumonia, unspecified organism: Secondary | ICD-10-CM | POA: Diagnosis not present

## 2019-12-31 DIAGNOSIS — J449 Chronic obstructive pulmonary disease, unspecified: Secondary | ICD-10-CM | POA: Diagnosis not present

## 2019-12-31 NOTE — Patient Instructions (Signed)
We will make a referral for you to see thoracic surgery, either Dr. Kipp Brood or Dr. Roxan Hockey, to discuss possible surgical treatment of your left lower lobe adenocarcinoma. Chest x-ray today Keep your albuterol (ProAir) available to use 2 puffs when you need it for shortness of breath, chest tightness, wheezing. We may decide to add an additional inhaler medication at some point in the future depending on how your breathing is doing. Work hard to continue to decrease her smoking.  Our ultimate goal will be for you to stop altogether.  We will try to help you with this. Follow with Dr Lamonte Sakai in 3 months or sooner if you have any problems.

## 2019-12-31 NOTE — Progress Notes (Signed)
Subjective:    Patient ID: Kelly Douglas, female    DOB: 04/09/1955, 64 y.o.   MRN: [id number]  HPI  ROV 08/07/2018 --Kelly Douglas is 44, has a history of tobacco use with mild COPD, scleroderma, coronary disease.  I have followed her for pulmonary nodular disease and abnormal CT scan of the chest.  She has left lower lobe nodular disease, low-level hypermetabolism on PET scan.  Navigational bronchoscopy October 2019 was negative for malignancy.  Most recent CT done 08/01/2018, reviewed by me shows that her nodules are stable in appearance and size, the largest is a semisolid 2.1 x 1.3 cm left lower lobe nodule. Stability to 11/01/16.   ROV 11/04/19 --follow-up visit 64 year old woman with history of mild COPD, active tobacco.  She has scleroderma, coronary disease.  Also pulmonary nodular disease that we have changed with tracked with serial imaging.  She underwent a navigational bronchoscopy 10/2017 that was negative for malignancy.  Her most recent CT chest was done 08/05/2019 and I have reviewed.  This shows continued gradual increase in her left lower lobe part solid nodule now gaining some solid component.  PFT 09/05/17 reviewed by me, show mild obstruction without a bronchodilator response, FEV1 2.34 L, 97% predicted.  There was a significant diffusion defect. Good exertional tolerance. No albuterol use.  ROV 12/31/19 --pleasant 64 year old woman with history of tobacco use and associated mild COPD, scleroderma, coronary artery disease.  She was diagnosed with left lower lobe adenocarcinoma on bronchoscopy 11/21/2019.  Her course was complicated by a delayed pneumothorax that was discovered on PET scan 12/10/2019.  She was admitted to the hospital for chest tube drainage with quick resolution on subsequent chest x-ray.  The PET scan showed that her left lower lobe pulmonary nodule was hypermetabolic, no evidence of metastatic disease.  She has seen Dr. Delton Coombes with oncology, is being considered  for possible primary resection although I do not see that the referral has yet been made. Uses albuterol 1-2x a day. Back to smoking 0.5pk/day. Not on scheduled BD.  Pulmonary function testing done on 12/23/2019 reviewed by me, show mild obstruction without a bronchodilator response, FEV1 93% predicted.   Review of Systems  HENT: Negative for dental problem and nosebleeds.   Respiratory: Positive for shortness of breath.   Allergic/Immunologic: Negative.   Psychiatric/Behavioral: The patient is nervous/anxious.         Objective:   Physical Exam Vitals:   12/31/19 1514  BP: 122/78  Pulse: 98  Temp: 98.4 F (36.9 C)  SpO2: 98%  Weight: 140 lb (63.5 kg)  Height: 5\' 8"  (1.727 m)   Gen: Pleasant, well-nourished, in no distress,  normal affect  ENT: No lesions,  mouth clear,  oropharynx clear, no postnasal drip  Neck: No JVD, no stridor  Lungs: No use of accessory muscles, clear without rales or rhonchi  Cardiovascular: RRR, heart sounds normal, no murmur or gallops, no peripheral edema  Musculoskeletal: No deformities, no cyanosis or clubbing  Neuro: alert, non focal  Skin: Some sclerodactyly and hyperpigmentation changes on her bilateral hands      Assessment & Plan:  Adenocarcinoma of left lung, stage 1 (HCC) Diagnosed by bronchoscopy.  Her PET scan is reassuring without any evidence of metastatic disease.  Pulmonary function testing show mild obstruction with acceptable FEV1.  She has seen Dr. Delton Coombes who was leaning towards primary resection.  I will go ahead and make the referral to thoracic surgery here in [place].  COPD (chronic obstructive pulmonary  disease) ([place]) Mild COPD based on symptoms, based on her pulmonary function testing from last week.  FEV1 93% predicted.  Not clear that she needs a schedule bronchodilator at this time although may be the case going forward depending on how symptoms evolve, stabilized.  Most important things will be for her to try  to stop smoking, get her lung cancer addressed.  She has albuterol which she can use as needed.  Pneumothorax on left This was a delayed pneumothorax post bronchoscopy discovered on her PET scan.  Resolved quickly after tube drainage.  I will repeat her chest x-ray today to ensure stability.  Nicotine dependence Smoking half pack a day.  Talked to her today about cutting down and ultimately quitting.  She is not sure that she can do it.  I asked her to continue to think about it.  We will have to continue to revisit.  Baltazar Apo, MD, PhD 12/31/2019, 3:50 PM [facility] [phone] or if no answer [phone]

## 2019-12-31 NOTE — Assessment & Plan Note (Signed)
Diagnosed by bronchoscopy.  Her PET scan is reassuring without any evidence of metastatic disease.  Pulmonary function testing show mild obstruction with acceptable FEV1.  She has seen Dr. Delton Coombes who was leaning towards primary resection.  I will go ahead and make the referral to thoracic surgery here in [place].

## 2019-12-31 NOTE — Assessment & Plan Note (Signed)
Mild COPD based on symptoms, based on her pulmonary function testing from last week.  FEV1 93% predicted.  Not clear that she needs a schedule bronchodilator at this time although may be the case going forward depending on how symptoms evolve, stabilized.  Most important things will be for her to try to stop smoking, get her lung cancer addressed.  She has albuterol which she can use as needed.

## 2019-12-31 NOTE — Assessment & Plan Note (Signed)
This was a delayed pneumothorax post bronchoscopy discovered on her PET scan.  Resolved quickly after tube drainage.  I will repeat her chest x-ray today to ensure stability.

## 2019-12-31 NOTE — Assessment & Plan Note (Signed)
Smoking half pack a day.  Talked to her today about cutting down and ultimately quitting.  She is not sure that she can do it.  I asked her to continue to think about it.  We will have to continue to revisit.

## 2020-01-01 ENCOUNTER — Ambulatory Visit (HOSPITAL_COMMUNITY)
Admission: RE | Admit: 2020-01-01 | Discharge: 2020-01-01 | Disposition: A | Discharge status: Home / Self Care | Payer: Medicare HMO | Source: Ambulatory Visit | Attending: Hematology | Admitting: Hematology

## 2020-01-01 ENCOUNTER — Telehealth: Payer: Self-pay | Admitting: Emergency Medicine

## 2020-01-01 DIAGNOSIS — C3490 Malignant neoplasm of unspecified part of unspecified bronchus or lung: Secondary | ICD-10-CM

## 2020-01-01 MED ORDER — GADOBUTROL 1 MMOL/ML IV SOLN
6.0000 mL | Freq: Once | INTRAVENOUS | Status: AC | PRN
  Administered 2020-01-01: 6 mL via INTRAVENOUS

## 2020-01-01 NOTE — Telephone Encounter (Signed)
Thanks. The opacity is her known NSCLCA

## 2020-01-01 NOTE — Telephone Encounter (Signed)
Received call report from [facility] with [facility] on patient's chest xray  done on 12/31/19. RB please review the result/impression copied below:   IMPRESSION: No evident pneumothorax. Postoperative change with volume loss on the left. Subtle ill-defined opacity left upper lobe may represent early pneumonia. Lungs elsewhere clear. Stable cardiac silhouette.  Please advise, thank you.

## 2020-01-03 ENCOUNTER — Encounter: Payer: Medicare HMO | Admitting: Thoracic Surgery (Cardiothoracic Vascular Surgery)

## 2020-01-07 ENCOUNTER — Other Ambulatory Visit: Payer: Self-pay | Admitting: Family Medicine

## 2020-01-07 ENCOUNTER — Encounter: Payer: Medicare HMO | Admitting: Family Medicine

## 2020-01-22 ENCOUNTER — Encounter: Payer: Medicare HMO | Admitting: Family Medicine

## 2020-01-24 ENCOUNTER — Encounter: Payer: Medicare HMO | Admitting: Thoracic Surgery (Cardiothoracic Vascular Surgery)

## 2020-02-03 ENCOUNTER — Ambulatory Visit (HOSPITAL_COMMUNITY): Payer: Medicare HMO | Admitting: Hematology

## 2020-02-04 ENCOUNTER — Encounter: Payer: Medicare HMO | Admitting: Thoracic Surgery (Cardiothoracic Vascular Surgery)

## 2020-02-07 ENCOUNTER — Encounter: Payer: Medicare HMO | Admitting: Thoracic Surgery (Cardiothoracic Vascular Surgery)

## 2020-02-14 ENCOUNTER — Encounter: Payer: Medicare HMO | Admitting: Thoracic Surgery (Cardiothoracic Vascular Surgery)

## 2020-02-23 ENCOUNTER — Encounter (HOSPITAL_COMMUNITY): Payer: Self-pay | Admitting: Emergency Medicine

## 2020-02-23 ENCOUNTER — Emergency Department (HOSPITAL_COMMUNITY): Payer: Medicare HMO

## 2020-02-23 ENCOUNTER — Inpatient Hospital Stay (HOSPITAL_COMMUNITY)
Admission: EM | Admit: 2020-02-23 | Discharge: 2020-02-25 | DRG: 683 | Disposition: A | Discharge status: Home Health | Payer: Medicare HMO | Attending: Internal Medicine | Admitting: Internal Medicine

## 2020-02-23 ENCOUNTER — Other Ambulatory Visit: Payer: Self-pay

## 2020-02-23 DIAGNOSIS — E039 Hypothyroidism, unspecified: Secondary | ICD-10-CM | POA: Diagnosis present

## 2020-02-23 DIAGNOSIS — E038 Other specified hypothyroidism: Secondary | ICD-10-CM | POA: Diagnosis not present

## 2020-02-23 DIAGNOSIS — Z886 Allergy status to analgesic agent status: Secondary | ICD-10-CM

## 2020-02-23 DIAGNOSIS — F419 Anxiety disorder, unspecified: Secondary | ICD-10-CM | POA: Diagnosis present

## 2020-02-23 DIAGNOSIS — R069 Unspecified abnormalities of breathing: Secondary | ICD-10-CM | POA: Diagnosis not present

## 2020-02-23 DIAGNOSIS — R911 Solitary pulmonary nodule: Secondary | ICD-10-CM | POA: Diagnosis not present

## 2020-02-23 DIAGNOSIS — Z91048 Other nonmedicinal substance allergy status: Secondary | ICD-10-CM

## 2020-02-23 DIAGNOSIS — I10 Essential (primary) hypertension: Secondary | ICD-10-CM | POA: Diagnosis present

## 2020-02-23 DIAGNOSIS — R531 Weakness: Secondary | ICD-10-CM

## 2020-02-23 DIAGNOSIS — E871 Hypo-osmolality and hyponatremia: Secondary | ICD-10-CM | POA: Diagnosis not present

## 2020-02-23 DIAGNOSIS — E860 Dehydration: Secondary | ICD-10-CM | POA: Diagnosis present

## 2020-02-23 DIAGNOSIS — D72829 Elevated white blood cell count, unspecified: Secondary | ICD-10-CM

## 2020-02-23 DIAGNOSIS — R1031 Right lower quadrant pain: Secondary | ICD-10-CM | POA: Diagnosis not present

## 2020-02-23 DIAGNOSIS — R110 Nausea: Secondary | ICD-10-CM

## 2020-02-23 DIAGNOSIS — C3490 Malignant neoplasm of unspecified part of unspecified bronchus or lung: Secondary | ICD-10-CM | POA: Diagnosis not present

## 2020-02-23 DIAGNOSIS — Z20822 Contact with and (suspected) exposure to covid-19: Secondary | ICD-10-CM | POA: Diagnosis present

## 2020-02-23 DIAGNOSIS — R778 Other specified abnormalities of plasma proteins: Secondary | ICD-10-CM | POA: Diagnosis not present

## 2020-02-23 DIAGNOSIS — M1990 Unspecified osteoarthritis, unspecified site: Secondary | ICD-10-CM | POA: Diagnosis present

## 2020-02-23 DIAGNOSIS — J449 Chronic obstructive pulmonary disease, unspecified: Secondary | ICD-10-CM | POA: Diagnosis present

## 2020-02-23 DIAGNOSIS — Z8249 Family history of ischemic heart disease and other diseases of the circulatory system: Secondary | ICD-10-CM | POA: Diagnosis not present

## 2020-02-23 DIAGNOSIS — F32A Depression, unspecified: Secondary | ICD-10-CM | POA: Diagnosis present

## 2020-02-23 DIAGNOSIS — Z86010 Personal history of colonic polyps: Secondary | ICD-10-CM | POA: Diagnosis not present

## 2020-02-23 DIAGNOSIS — F17210 Nicotine dependence, cigarettes, uncomplicated: Secondary | ICD-10-CM | POA: Diagnosis present

## 2020-02-23 DIAGNOSIS — E785 Hyperlipidemia, unspecified: Secondary | ICD-10-CM | POA: Diagnosis present

## 2020-02-23 DIAGNOSIS — Z79899 Other long term (current) drug therapy: Secondary | ICD-10-CM | POA: Diagnosis not present

## 2020-02-23 DIAGNOSIS — W19XXXA Unspecified fall, initial encounter: Secondary | ICD-10-CM | POA: Diagnosis present

## 2020-02-23 DIAGNOSIS — K219 Gastro-esophageal reflux disease without esophagitis: Secondary | ICD-10-CM | POA: Diagnosis present

## 2020-02-23 DIAGNOSIS — G8929 Other chronic pain: Secondary | ICD-10-CM | POA: Diagnosis present

## 2020-02-23 DIAGNOSIS — R7989 Other specified abnormal findings of blood chemistry: Secondary | ICD-10-CM | POA: Diagnosis not present

## 2020-02-23 DIAGNOSIS — R1111 Vomiting without nausea: Secondary | ICD-10-CM | POA: Diagnosis not present

## 2020-02-23 DIAGNOSIS — R638 Other symptoms and signs concerning food and fluid intake: Secondary | ICD-10-CM

## 2020-02-23 DIAGNOSIS — M349 Systemic sclerosis, unspecified: Secondary | ICD-10-CM | POA: Diagnosis present

## 2020-02-23 DIAGNOSIS — E861 Hypovolemia: Secondary | ICD-10-CM | POA: Diagnosis not present

## 2020-02-23 DIAGNOSIS — N179 Acute kidney failure, unspecified: Secondary | ICD-10-CM | POA: Diagnosis not present

## 2020-02-23 DIAGNOSIS — D72828 Other elevated white blood cell count: Secondary | ICD-10-CM

## 2020-02-23 DIAGNOSIS — Z79890 Hormone replacement therapy (postmenopausal): Secondary | ICD-10-CM

## 2020-02-23 DIAGNOSIS — I2510 Atherosclerotic heart disease of native coronary artery without angina pectoris: Secondary | ICD-10-CM | POA: Diagnosis present

## 2020-02-23 DIAGNOSIS — M549 Dorsalgia, unspecified: Secondary | ICD-10-CM | POA: Diagnosis present

## 2020-02-23 DIAGNOSIS — R000 Tachycardia, unspecified: Secondary | ICD-10-CM | POA: Diagnosis not present

## 2020-02-23 DIAGNOSIS — E876 Hypokalemia: Secondary | ICD-10-CM | POA: Diagnosis not present

## 2020-02-23 DIAGNOSIS — R197 Diarrhea, unspecified: Secondary | ICD-10-CM | POA: Diagnosis not present

## 2020-02-23 DIAGNOSIS — R0602 Shortness of breath: Secondary | ICD-10-CM | POA: Diagnosis not present

## 2020-02-23 HISTORY — DX: Malignant (primary) neoplasm, unspecified: C80.1

## 2020-02-23 LAB — CBC
HCT: 39.8 % (ref 36.0–46.0)
Hemoglobin: 13.2 g/dL (ref 12.0–15.0)
MCH: 26 pg (ref 26.0–34.0)
MCHC: 33.2 g/dL (ref 30.0–36.0)
MCV: 78.5 fL — ABNORMAL LOW (ref 80.0–100.0)
Platelets: 384 10*3/uL (ref 150–400)
RBC: 5.07 MIL/uL (ref 3.87–5.11)
RDW: 15 % (ref 11.5–15.5)
WBC: 18.4 10*3/uL — ABNORMAL HIGH (ref 4.0–10.5)
nRBC: 0 % (ref 0.0–0.2)

## 2020-02-23 LAB — TROPONIN I (HIGH SENSITIVITY)
Troponin I (High Sensitivity): 332 ng/L (ref <18)
Troponin I (High Sensitivity): 247 ng/L (ref <18)
Troponin I (High Sensitivity): 212 ng/L (ref <18)

## 2020-02-23 LAB — URINALYSIS, ROUTINE W REFLEX MICROSCOPIC
Bilirubin Urine: NEGATIVE
Glucose, UA: NEGATIVE mg/dL
Hgb urine dipstick: NEGATIVE
Ketones, ur: NEGATIVE mg/dL
Leukocytes,Ua: NEGATIVE
Nitrite: NEGATIVE
Protein, ur: NEGATIVE mg/dL
Specific Gravity, Urine: 1.012 (ref 1.005–1.030)
pH: 7 (ref 5.0–8.0)

## 2020-02-23 LAB — COMPREHENSIVE METABOLIC PANEL
ALT: 36 U/L (ref 0–44)
AST: 40 U/L (ref 15–41)
Albumin: 4.6 g/dL (ref 3.5–5.0)
Alkaline Phosphatase: 64 U/L (ref 38–126)
Anion gap: 10 (ref 5–15)
BUN: 30 mg/dL — ABNORMAL HIGH (ref 8–23)
CO2: 25 mmol/L (ref 22–32)
Calcium: 9.9 mg/dL (ref 8.9–10.3)
Chloride: 94 mmol/L — ABNORMAL LOW (ref 98–111)
Creatinine, Ser: 2 mg/dL — ABNORMAL HIGH (ref 0.44–1.00)
GFR, Estimated: 27 mL/min — ABNORMAL LOW (ref >60)
Glucose, Bld: 121 mg/dL — ABNORMAL HIGH (ref 70–99)
Potassium: 5.3 mmol/L — ABNORMAL HIGH (ref 3.5–5.1)
Sodium: 129 mmol/L — ABNORMAL LOW (ref 135–145)
Total Bilirubin: 1.4 mg/dL — ABNORMAL HIGH (ref 0.3–1.2)
Total Protein: 8.3 g/dL — ABNORMAL HIGH (ref 6.5–8.1)

## 2020-02-23 LAB — SARS CORONAVIRUS 2 BY RT PCR (HOSPITAL ORDER, PERFORMED IN ~~LOC~~ HOSPITAL LAB): SARS Coronavirus 2: NEGATIVE

## 2020-02-23 LAB — LACTIC ACID, PLASMA: Lactic Acid, Venous: 1.3 mmol/L (ref 0.5–1.9)

## 2020-02-23 LAB — LIPASE, BLOOD: Lipase: 80 U/L — ABNORMAL HIGH (ref 11–51)

## 2020-02-23 LAB — PROCALCITONIN: Procalcitonin: <0.1 ng/mL

## 2020-02-23 MED ORDER — HYDROXYZINE HCL 25 MG PO TABS: 25.0000 mg | ORAL_TABLET | Freq: Three times a day (TID) | ORAL | Status: DC | PRN

## 2020-02-23 MED ORDER — ALBUTEROL SULFATE HFA 108 (90 BASE) MCG/ACT IN AERS: 2.0000 | INHALATION_SPRAY | Freq: Four times a day (QID) | RESPIRATORY_TRACT | Status: DC | PRN

## 2020-02-23 MED ORDER — ACETAMINOPHEN 325 MG PO TABS: 650.0000 mg | ORAL_TABLET | Freq: Four times a day (QID) | ORAL | Status: DC | PRN

## 2020-02-23 MED ORDER — ACETAMINOPHEN 650 MG RE SUPP: 650.0000 mg | Freq: Four times a day (QID) | RECTAL | Status: DC | PRN

## 2020-02-23 MED ORDER — METRONIDAZOLE IN NACL 5-0.79 MG/ML-% IV SOLN
500.0000 mg | Freq: Once | INTRAVENOUS | Status: AC
  Administered 2020-02-23: 500 mg via INTRAVENOUS
  Filled 2020-02-23: qty 100

## 2020-02-23 MED ORDER — OXYCODONE HCL 5 MG PO TABS: 5.0000 mg | ORAL_TABLET | ORAL | Status: DC | PRN

## 2020-02-23 MED ORDER — MONTELUKAST SODIUM 10 MG PO TABS
10.0000 mg | ORAL_TABLET | Freq: Every day | ORAL | Status: DC
  Administered 2020-02-23 – 2020-02-24 (×2): 10 mg via ORAL
  Filled 2020-02-23 (×2): qty 1

## 2020-02-23 MED ORDER — DULOXETINE HCL 60 MG PO CPEP
60.0000 mg | ORAL_CAPSULE | Freq: Every day | ORAL | Status: DC
  Administered 2020-02-24 – 2020-02-25 (×2): 60 mg via ORAL
  Filled 2020-02-23 (×2): qty 1

## 2020-02-23 MED ORDER — ONDANSETRON HCL 4 MG/2ML IJ SOLN: 4.0000 mg | Freq: Four times a day (QID) | INTRAMUSCULAR | Status: DC | PRN

## 2020-02-23 MED ORDER — ONDANSETRON HCL 4 MG/2ML IJ SOLN
4.0000 mg | Freq: Once | INTRAMUSCULAR | Status: AC
  Administered 2020-02-23: 4 mg via INTRAVENOUS
  Filled 2020-02-23: qty 2

## 2020-02-23 MED ORDER — MORPHINE SULFATE (PF) 4 MG/ML IV SOLN
4.0000 mg | Freq: Once | INTRAVENOUS | Status: AC
  Administered 2020-02-23: 4 mg via INTRAVENOUS
  Filled 2020-02-23: qty 1

## 2020-02-23 MED ORDER — LEVOTHYROXINE SODIUM 50 MCG PO TABS: 75.0000 ug | ORAL_TABLET | ORAL | Status: DC

## 2020-02-23 MED ORDER — SODIUM CHLORIDE 0.9 % IV SOLN
1.0000 g | Freq: Once | INTRAVENOUS | Status: AC
  Administered 2020-02-23: 1 g via INTRAVENOUS
  Filled 2020-02-23 (×2): qty 1

## 2020-02-23 MED ORDER — SODIUM CHLORIDE 0.9 % IV SOLN: INTRAVENOUS | Status: DC

## 2020-02-23 MED ORDER — LEVOTHYROXINE SODIUM 25 MCG PO TABS
37.5000 ug | ORAL_TABLET | ORAL | Status: DC
  Administered 2020-02-25: 37.5 ug via ORAL
  Filled 2020-02-23: qty 2

## 2020-02-23 MED ORDER — SODIUM CHLORIDE 0.9 % IV BOLUS
1000.0000 mL | Freq: Once | INTRAVENOUS | Status: AC
  Administered 2020-02-23: 1000 mL via INTRAVENOUS

## 2020-02-23 MED ORDER — IOHEXOL 9 MG/ML PO SOLN
ORAL | Status: AC
  Filled 2020-02-23: qty 1000

## 2020-02-23 MED ORDER — HEPARIN SODIUM (PORCINE) 5000 UNIT/ML IJ SOLN
5000.0000 [IU] | Freq: Three times a day (TID) | INTRAMUSCULAR | Status: DC
  Administered 2020-02-23 – 2020-02-25 (×4): 5000 [IU] via SUBCUTANEOUS
  Filled 2020-02-23 (×5): qty 1

## 2020-02-23 MED ORDER — DULOXETINE HCL 30 MG PO CPEP
60.0000 mg | ORAL_CAPSULE | Freq: Every day | ORAL | Status: DC
Start: 2020-02-23 — End: 2020-02-23

## 2020-02-23 MED ORDER — AMLODIPINE BESYLATE 5 MG PO TABS
5.0000 mg | ORAL_TABLET | Freq: Every day | ORAL | Status: DC
  Administered 2020-02-23: 5 mg via ORAL
  Filled 2020-02-23 (×2): qty 1

## 2020-02-23 MED ORDER — ATORVASTATIN CALCIUM 10 MG PO TABS: 10.0000 mg | ORAL_TABLET | Freq: Every day | ORAL | Status: DC

## 2020-02-23 MED ORDER — LEVOTHYROXINE SODIUM 75 MCG PO TABS: 75.0000 ug | ORAL_TABLET | ORAL | Status: DC

## 2020-02-23 MED ORDER — ONDANSETRON HCL 4 MG PO TABS: 4.0000 mg | ORAL_TABLET | Freq: Four times a day (QID) | ORAL | Status: DC | PRN

## 2020-02-23 MED ORDER — ATORVASTATIN CALCIUM 10 MG PO TABS
10.0000 mg | ORAL_TABLET | Freq: Every day | ORAL | Status: DC
  Administered 2020-02-24: 10 mg via ORAL
  Filled 2020-02-23: qty 1

## 2020-02-23 NOTE — ED Provider Notes (Signed)
[facility] EMERGENCY DEPARTMENT Provider Note   CSN: [id number] Arrival date & time: 02/23/20  1523     History Chief Complaint  Patient presents with  . Weakness    Kelly Douglas is a 65 y.o. female.  Patient with hx non-small cell lung ca recently dx (no prior treatment) c/o feeling generally tired, weak, w markedly decreased appetite w very little recent po intake, and upper abd discomfort in the past 1-2 weeks. Symptoms acute onset, moderate, constant, dull. No pleuritic pain. No chest pain or chest discomfort. +recent non prod cough and congestion with possible recent exposure to person w covid. No fever or chills. Nausea. No vomiting. Having normal bms. No abd distension. Remote hx appendectomy, denies other abd surgery. No hx gallstones, pud or pancreatitis. Denies acute wt loss. No leg pain or swelling. No focal or unilateral numbness or weakness. No change in speech or vision. Compliant w normal meds but says recently ran out of her bp meds.   The history is provided by the patient and the EMS personnel.  Weakness Associated symptoms: cough and nausea   Associated symptoms: no abdominal pain, no chest pain, no diarrhea, no dysuria, no fever, no headaches, no shortness of breath and no vomiting        Past Medical History:  Diagnosis Date  . Anxiety   . Arteriosclerotic cardiovascular disease (ASCVD) 2013   coronary calcification-left main, LAD and CX; small pericardial effusion  . Arthritis    hips  . Cancer ([place])    Lung Cancer   . Chronic back pain   . Chronic bronchitis   . Chronic lung disease    Chronic scarring and volume loss-left lung; characteristics of a chronic infectious process-possible MAI  . Depression   . Dyspnea    occasional   . GERD (gastroesophageal reflux disease)    Dr Gala Romney EGD 09/2009->esophagitis, sm HH, antral erosions, atonic esophagus  . Hyperlipidemia   . Hypertension   . Hypothyroid 1981 approx  . Scleroderma ([place])   . Seasonal  allergies   . Syncope    Multiple spells over the past 40+ years, likely neurocardiogenic  . Tobacco abuse 06/24/2009    Patient Active Problem List   Diagnosis Date Noted  . COPD (chronic obstructive pulmonary disease) ([place]) 12/31/2019  . Pneumothorax on left 12/10/2019  . Thrombocytosis 12/10/2019  . Hx of adenomatous colonic polyps 09/03/2019  . Cervical lymphadenopathy 05/16/2019  . Hypokalemia 03/28/2018  . Hypomagnesemia 03/28/2018  . S/P bronchoscopy 10/18/2017  . Adenocarcinoma of left lung, stage 1 ([place]) 11/18/2016  . Pollen allergies 05/19/2015  . Nicotine dependence 09/17/2013  . Hoarseness of voice 09/16/2013  . Cervical pain (neck) 06/21/2013  . Back pain with radiation 06/21/2013  . IDA (iron deficiency anemia) 08/10/2012  . HTN (hypertension) 02/22/2012  . Insomnia 05/09/2011  . GERD (gastroesophageal reflux disease)   . Scleroderma ([place])   . Hypothyroidism   . Arteriosclerotic cardiovascular disease (ASCVD)   . Syncope   . Anxiety and depression 06/24/2009  . Vitamin D deficiency 03/18/2009  . Hyperlipidemia 03/18/2009    Past Surgical History:  Procedure Laterality Date  . BILATERAL SALPINGOOPHORECTOMY  2013    Dr. Elonda Husky; uterus remains in situ  . BRONCHIAL BIOPSY  11/21/2019   Procedure: BRONCHIAL BIOPSIES;  Surgeon: Collene Gobble, MD;  Location: [facility] ENDOSCOPY;  Service: Pulmonary;;  . BRONCHIAL BRUSHINGS  11/21/2019   Procedure: BRONCHIAL BRUSHINGS;  Surgeon: Collene Gobble, MD;  Location: [place];  Service:  Pulmonary;;  . BRONCHIAL NEEDLE ASPIRATION BIOPSY  11/21/2019   Procedure: BRONCHIAL NEEDLE ASPIRATION BIOPSIES;  Surgeon: Collene Gobble, MD;  Location: [facility] ENDOSCOPY;  Service: Pulmonary;;  . BRONCHIAL WASHINGS  11/21/2019   Procedure: BRONCHIAL WASHINGS;  Surgeon: Collene Gobble, MD;  Location: [facility] ENDOSCOPY;  Service: Pulmonary;;  . COLONOSCOPY  09/28/09   anal papilla otherwise normal  . COLONOSCOPY N/A 08/29/2012   [id number] polyp-removed  as described above. tubular adenoma  . COLONOSCOPY WITH PROPOFOL N/A 11/11/2019   Procedure: COLONOSCOPY WITH PROPOFOL;  Surgeon: Daneil Dolin, MD;  [facility];  Service: Endoscopy;  Laterality: N/A;  2:30pm  . ESOPHAGOGASTRODUODENOSCOPY  10/07/09   Dr. Barrie Dunker of esophageal mucosa, diffusely ?esophagitis (bx benign), small HH/antral erosions, erythema bx benign. 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 ESOPHAGOGASTRODUODENOSCOPY (EGD) WITH ESOPHAGEAL DILATION N/A 03/07/2012   [id number], patent, tubular esophagus of uncertain significance-status post biopsy )unremarkable). Hiatal hernia  . FUDUCIAL PLACEMENT  10/18/2017   Procedure: PLACEMENT OF 3 FUDUCIAL INTO LEFT LOWER LOBE OF LUNG-TARGET 1;  Surgeon: Collene Gobble, MD;  Location: [place];  Service: Thoracic;;  . LAPAROSCOPIC APPENDECTOMY  07/16/2011   Procedure: APPENDECTOMY LAPAROSCOPIC;  Surgeon: Donato Heinz, MD;  Location: [facility] ORS;  Service: General;  Laterality: N/A;  . POLYPECTOMY  11/11/2019   Procedure: POLYPECTOMY;  Surgeon: Daneil Dolin, MD;  Location: [facility];  Service: Endoscopy;;  . TUBAL LIGATION    . VIDEO BRONCHOSCOPY  10/18/2017  . VIDEO BRONCHOSCOPY WITH ENDOBRONCHIAL NAVIGATION N/A 10/18/2017   Procedure: VIDEO BRONCHOSCOPY WITH ENDOBRONCHIAL NAVIGATION;  Surgeon: Collene Gobble, MD;  Location: [facility] OR;  Service: Thoracic;  Laterality: N/A;  . VIDEO BRONCHOSCOPY WITH ENDOBRONCHIAL NAVIGATION Left 11/21/2019   Procedure: VIDEO BRONCHOSCOPY WITH ENDOBRONCHIAL NAVIGATION;  Surgeon: Collene Gobble, MD;  Location: [place] ENDOSCOPY;  Service: Pulmonary;  Laterality: Left;     OB History    Gravida  2   Para  2   Term  2   Preterm      AB      Living  2     SAB      IAB      Ectopic      Multiple      Live Births              Family History  Problem Relation Age of Onset  . Stroke Father   . Aneurysm Father   . Coronary artery disease Brother   . Hypertension  Brother   . Down syndrome Brother   . Hypertension Brother   . Hypertension Brother        father/mother  . Aneurysm Mother   . Throat cancer Brother 49       throat cancer  . Hypertension Sister   . Diabetes Brother   . Diabetes Brother   . Anesthesia problems Neg Hx   . Hypotension Neg Hx   . Malignant hyperthermia Neg Hx   . Pseudochol deficiency Neg Hx   . Colon cancer Neg Hx     Social History   Tobacco Use  . Smoking status: Current Every Day Smoker    Packs/day: 0.50    Years: 45.00    Pack years: 22.50    Types: Cigarettes    Start date: 07/14/1971  . Smokeless tobacco: Never Used  . Tobacco comment: 0.5 packs of cigarettes smoked daily ARJ 12/31/19  Vaping Use  . Vaping Use: Never used  Substance Use Topics  .  Alcohol use: Not Currently    Alcohol/week: 0.0 standard drinks    Comment: quit June 2012-used to drink 6 pack daily, occasional beer per pt 11/20/19  . Drug use: No    Home Medications Prior to Admission medications   Medication Sig Start Date End Date Taking? Authorizing Provider  acetaminophen (TYLENOL) 650 MG CR tablet Take 1,300 mg by mouth every 8 (eight) hours as needed for pain.     [provider]  amLODipine (NORVASC) 5 MG tablet Take 1 tablet (5 mg total) by mouth daily. 04/17/19   [name], MD  atorvastatin (LIPITOR) 10 MG tablet Take 1 tablet (10 mg total) by mouth daily. 10/16/19   [name], MD  Cholecalciferol (VITAMIN D) 125 MCG (5000 UT) CAPS Take 5,000 Units by mouth daily.     [provider]  DULoxetine (CYMBALTA) 60 MG capsule TAKE 1 CAPSULE EVERY DAY 01/07/20   [name], MD  guaiFENesin-dextromethorphan (ROBITUSSIN DM) 100-10 MG/5ML syrup Take 5 mLs by mouth every 4 (four) hours as needed for cough. 12/12/19   Swayze, Ava, DO  hydrOXYzine (ATARAX/VISTARIL) 25 MG tablet Take 1 tablet (25 mg total) by mouth 3 (three) times daily as needed for anxiety or itching. 12/12/19   Swayze, Ava, DO   levothyroxine (SYNTHROID) 75 MCG tablet TAKE 1 TABLET BY MOUTH  EVERY OTHER DAY ALTERNATING WITH 1/2 TABLET EVERY OTHER DAY 01/07/20   [name], MD  Lidocaine-Menthol 4-1 % [facility] Apply 1 patch topically daily as needed (pain).     [provider]  Magnesium 500 MG CAPS Take 1 capsule by mouth daily.     [provider]  montelukast (SINGULAIR) 10 MG tablet Take 1 tablet (10 mg total) by mouth at bedtime. 11/14/19   [name], MD  oxyCODONE (OXY IR/ROXICODONE) 5 MG immediate release tablet Take 1 tablet (5 mg total) by mouth every 4 (four) hours as needed for moderate pain. 12/12/19   Swayze, Ava, DO  pantoprazole (PROTONIX) 40 MG tablet TAKE 1 TABLET TWICE DAILY 01/07/20   [name], MD  potassium chloride (KLOR-CON 10) 10 MEQ tablet Take 1 tablet (10 mEq total) by mouth daily. 04/17/19   [name], MD  PROAIR RESPICLICK 161 [id number] Base) MCG/ACT AEPB Inhale 2 puffs into the lungs 4 (four) times daily as needed. 08/07/18   Collene Gobble, MD    Allergies    Aspirin  Review of Systems   Review of Systems  Constitutional: Positive for appetite change. Negative for chills and fever.  HENT: Positive for congestion. Negative for sore throat.   Eyes: Negative for redness.  Respiratory: Positive for cough. Negative for shortness of breath.   Cardiovascular: Negative for chest pain, palpitations and leg swelling.  Gastrointestinal: Positive for nausea. Negative for abdominal pain, constipation, diarrhea and vomiting.  Genitourinary: Negative for dysuria and flank pain.  Musculoskeletal: Negative for back pain and neck pain.  Skin: Negative for rash.  Neurological: Positive for weakness. Negative for speech difficulty, numbness and headaches.  Hematological: Does not bruise/bleed easily.  Psychiatric/Behavioral: Negative for confusion.    Physical Exam Updated Vital Signs Ht 1.727 m (5\' 8" )   Wt 63.5 kg   LMP  (LMP Unknown)   BMI 21.29  kg/m   Physical Exam Vitals and nursing note reviewed.  Constitutional:      Appearance: Normal appearance. She is well-developed.  HENT:     Head: Atraumatic.     Nose: Nose  normal.     Mouth/Throat:     Mouth: Mucous membranes are moist.  Eyes:     General: No scleral icterus.    Conjunctiva/sclera: Conjunctivae normal.     Pupils: Pupils are equal, round, and reactive to light.  Neck:     Vascular: No carotid bruit.     Trachea: No tracheal deviation.     Comments: No stiffness or rigidity.  Cardiovascular:     Rate and Rhythm: Regular rhythm. Tachycardia present.     Pulses: Normal pulses.     Heart sounds: Normal heart sounds. No murmur heard. No friction rub. No gallop.   Pulmonary:     Effort: Pulmonary effort is normal. No respiratory distress.     Breath sounds: Normal breath sounds.  Abdominal:     General: Bowel sounds are normal. There is no distension.     Palpations: Abdomen is soft. There is no mass.     Tenderness: There is abdominal tenderness. There is no guarding or rebound.     Hernia: No hernia is present.     Comments: Mid to upper abd tenderness. No peritoneal signs.   Genitourinary:    Comments: No cva tenderness.  Musculoskeletal:        General: No swelling or tenderness.     Cervical back: Normal range of motion and neck supple. No rigidity. No muscular tenderness.     Right lower leg: No edema.     Left lower leg: No edema.  Skin:    General: Skin is warm and dry.     Findings: No rash.  Neurological:     Mental Status: She is alert.     Comments: Alert, speech normal. Motor/sens grossly intact bil.   Psychiatric:        Mood and Affect: Mood normal.     ED Results / Procedures / Treatments   Labs (all labs ordered are listed, but only abnormal results are displayed) Results for orders placed or performed during the hospital encounter of 02/23/20  SARS Coronavirus 2 by RT PCR (hospital order, performed in [place] hospital lab)  Nasopharyngeal Nasopharyngeal Swab   Specimen: Nasopharyngeal Swab  Result Value Ref Range   SARS Coronavirus 2 NEGATIVE NEGATIVE  Blood culture (routine x 2)   Specimen: BLOOD RIGHT HAND  Result Value Ref Range   Specimen Description      BLOOD RIGHT HAND BOTTLES DRAWN AEROBIC AND ANAEROBIC   Special Requests      Blood Culture adequate volume Performed at [facility], [address], [place], [place] [address]    Culture PENDING    Report Status PENDING   Blood culture (routine x 2)   Specimen: Right Antecubital; Blood  Result Value Ref Range   Specimen Description      RIGHT ANTECUBITAL BOTTLES DRAWN AEROBIC AND ANAEROBIC   Special Requests      Blood Culture adequate volume Performed at [facility], [address] [place], [place] [address]    Culture PENDING    Report Status PENDING   CBC  Result Value Ref Range   WBC 18.4 (H) 4.0 - 10.5 K/uL   RBC 5.07 3.87 - 5.11 MIL/uL   Hemoglobin 13.2 12.0 - 15.0 g/dL   HCT 39.8 36.0 - 46.0 %   MCV 78.5 (L) 80.0 - 100.0 fL   MCH 26.0 26.0 - 34.0 pg   MCHC 33.2 30.0 - 36.0 g/dL   RDW 15.0 11.5 - 15.5 %   Platelets 384  150 - 400 K/uL   nRBC 0.0 0.0 - 0.2 %  Comprehensive metabolic panel  Result Value Ref Range   Sodium 129 (L) 135 - 145 mmol/L   Potassium 5.3 (H) 3.5 - 5.1 mmol/L   Chloride 94 (L) 98 - 111 mmol/L   CO2 25 22 - 32 mmol/L   Glucose, Bld 121 (H) 70 - 99 mg/dL   BUN 30 (H) 8 - 23 mg/dL   Creatinine, Ser 2.00 (H) 0.44 - 1.00 mg/dL   Calcium 9.9 8.9 - 10.3 mg/dL   Total Protein 8.3 (H) 6.5 - 8.1 g/dL   Albumin 4.6 3.5 - 5.0 g/dL   AST 40 15 - 41 U/L   ALT 36 0 - 44 U/L   Alkaline Phosphatase 64 38 - 126 U/L   Total Bilirubin 1.4 (H) 0.3 - 1.2 mg/dL   GFR, Estimated 27 (L) >60 mL/min   Anion gap 10 5 - 15  Urinalysis, Routine w reflex microscopic Urine, Clean Catch  Result Value Ref Range   Color, Urine YELLOW YELLOW   APPearance CLEAR CLEAR   Specific Gravity, Urine 1.012 1.005 - 1.030   pH 7.0 5.0 -  8.0   Glucose, UA NEGATIVE NEGATIVE mg/dL   Hgb urine dipstick NEGATIVE NEGATIVE   Bilirubin Urine NEGATIVE NEGATIVE   Ketones, ur NEGATIVE NEGATIVE mg/dL   Protein, ur NEGATIVE NEGATIVE mg/dL   Nitrite NEGATIVE NEGATIVE   Leukocytes,Ua NEGATIVE NEGATIVE  Lipase, blood  Result Value Ref Range   Lipase 80 (H) 11 - 51 U/L  Lactic acid, plasma  Result Value Ref Range   Lactic Acid, Venous 1.3 0.5 - 1.9 mmol/L  Troponin I (High Sensitivity)  Result Value Ref Range   Troponin I (High Sensitivity) 332 (HH) <18 ng/L  Troponin I (High Sensitivity)  Result Value Ref Range   Troponin I (High Sensitivity) 247 (HH) <18 ng/L   DG Chest Port 1 View  Result Date: 02/23/2020 CLINICAL DATA:  Generalized weakness. EXAM: PORTABLE CHEST 1 VIEW COMPARISON:  December 31, 2019 FINDINGS: Tortuosity of the aorta. Cardiomediastinal silhouette is normal. Mediastinal contours appear intact. Increased attenuation of the left lung apex and nodular opacity in the left lower lobe are stable. No significant airspace consolidation. Osseous structures are without acute abnormality. Soft tissues are grossly normal. IMPRESSION: 1. Stable increased attenuation of the left lung apex and pulmonary nodule in the left lower lobe. 2. No significant airspace consolidation. Electronically Signed   By: Fidela Salisbury M.D.   On: 02/23/2020 16:17    EKG EKG Interpretation  Date/Time:  Sunday February 23 2020 15:33:03 EST Ventricular Rate:  114 PR Interval:    QRS Duration: 106 QT Interval:  356 QTC Calculation: 491 R Axis:   20 Text Interpretation: Sinus tachycardia Non-specific ST-t changes Confirmed by Lajean Saver [id number]) on 02/23/2020 3:47:21 PM   Radiology DG Chest Port 1 View  Result Date: 02/23/2020 CLINICAL DATA:  Generalized weakness. EXAM: PORTABLE CHEST 1 VIEW COMPARISON:  December 31, 2019 FINDINGS: Tortuosity of the aorta. Cardiomediastinal silhouette is normal. Mediastinal contours appear intact. Increased  attenuation of the left lung apex and nodular opacity in the left lower lobe are stable. No significant airspace consolidation. Osseous structures are without acute abnormality. Soft tissues are grossly normal. IMPRESSION: 1. Stable increased attenuation of the left lung apex and pulmonary nodule in the left lower lobe. 2. No significant airspace consolidation. Electronically Signed   By: Fidela Salisbury M.D.   On: 02/23/2020 16:17  Procedures Procedures   Medications Ordered in ED Medications - No data to display  ED Course  I have reviewed the triage vital signs and the nursing notes.  Pertinent labs & imaging results that were available during my care of the patient were reviewed by me and considered in my medical decision making (see chart for details).    MDM Rules/Calculators/A&P                          Iv ns. Continuous pulse ox and cardiac monitoring. Stat labs.   Reviewed nursing notes and prior charts for additional history.   Labs reviewed/interpreted by me - wbc elevated. Aki. Will add blood cultures and lactic. Iv ns boluses. Iv antibiotics given post cultures. Persistent upper abd tenderness. Will get ct.   Pt requests something for pain/abd pain - morphine iv. zofran iv.   Additional labs reviewed/interpreted by me - lactic acid is normal.   CXR reviewed/interpreted by me - no pna.   CT reviewed/intperpreted by me - no acute process.   Recheck pt - continues to deny any chest pain or discomfort. Troponins are high, although 2nd troponin is lower than initial.  Given poor po intake, general weakness, AKI, elevated trops, will admit.   Hospitalists consulted for admission.        Final Clinical Impression(s) / ED Diagnoses Final diagnoses:  None    Rx / DC Orders ED Discharge Orders    None       Lajean Saver, MD 02/23/20 2024

## 2020-02-23 NOTE — ED Triage Notes (Signed)
Pt arrived by [facility]. Pt c/o of generalized weakness x3 weeks. Pt c/o of RLQ abdominal pain x1 week. States she had N/V/D 1 week ago. Pt has been exposed to covid.

## 2020-02-23 NOTE — H&P (Signed)
TRH H&P    Patient Demographics:    Kelly Douglas, is a 65 y.o. female  MRN: [id number]  DOB - August 29, 1955  Admit Date - 02/23/2020  Referring MD/NP/PA: Ashok Cordia  Outpatient Primary MD for the patient is [name], MD  Patient coming from: Home  Chief complaint- weakness   HPI:    Kelly Douglas  is a 65 y.o. female, with history of tobacco abuse, syncope, scleroderma, hypothyroid, HTN, HLD, GERD, and depression/anxiety, presents to the ED with a chief complaint of weakness. Patient reports that it started 3 weeks ago, when her brother died. She reports that at the beginning of the three weeks, she had nausea and vomiting. The nausea was worse every time she tried to eat. She has abdominal pain that is a separate issue and has been present for 42 years. It is no different than her normal abdominal pain. She reports that she is no longer having nausea and vomiting, but she still has no appetite. She has not had a normal meal since December, per her report. She reports that she came in today because she has been feeling weak today, and she fell down 2/2 weakness. She was not able to stand up without support after the fall. She is not on blood thinners, and she has not hit her head. The weakness feels like it did when she had hypokalemia in the past. Patient reports that she has had decreased urine output and darker urine. No hematuria. Patient reports that she attributed the decrease in urine output to decrease in PO intake. Patient reports that she has felt feverish, but has not had any measured fevers. Patient admits to her normal smoker's cough. She has no other complaints at this time.   Of Note - patient is very focused on the loss of several family members. Despite attempts to deflect, she has recounted the deaths of each of her siblings, and the last brother just died a few weeks ago. She reports that he had  Down's and she spent each day taking care of him. It is quite possible that her lack in appetite and GI symptoms are 2/2 depression.  Patient has lung cancer, and still smokes. She does not use EtOH, and no illicit drugs. She has not been vaccinated for covid, and she is full code.    In the ED T 98.7, HR 30-113, R 14-22 BP 119/81 maintaining sats on room air  Some invalid vitals documented with O2 in 60s and HR in 30s - not accurate Ua not indicative of UTI Blood culture pending CXR - No significant airspace consolidation WBC 18.4, Hgb 13.2, plt 384 Chem = Na 129, K+ 5.3, BUN 30, Cr 2.00 Lipase 80 CT abd pelvis - no acute findings, chronic gall bladder distention Cefepime/flagyl Morphine, zofran 1L NaCl Lactic acid 1.3  Trop 332 to 247    Review of systems:    In addition to the HPI above,  Subjective fever, +Headache, No changes with Vision or hearing, No problems swallowing food or Liquids, No Chest pain, no  change in Cough, no Shortness of Breath, Admits to abdominal pain, nausea, vomiting, and constipation No Blood in stool or Urine, No dysuria, No new skin rashes or bruises, No new joints pains-aches,  No new asymmetric weakness, tingling, numbness in any extremity, No recent weight gain or loss, No polyuria, polydypsia or polyphagia,    All other systems reviewed and are negative.    Past History of the following :    Past Medical History:  Diagnosis Date  . Anxiety   . Arteriosclerotic cardiovascular disease (ASCVD) 2013   coronary calcification-left main, LAD and CX; small pericardial effusion  . Arthritis    hips  . Cancer ([place])    Lung Cancer   . Chronic back pain   . Chronic bronchitis   . Chronic lung disease    Chronic scarring and volume loss-left lung; characteristics of a chronic infectious process-possible MAI  . Depression   . Dyspnea    occasional   . GERD (gastroesophageal reflux disease)    Dr Gala Romney EGD 09/2009->esophagitis, sm HH,  antral erosions, atonic esophagus  . Hyperlipidemia   . Hypertension   . Hypothyroid 1981 approx  . Scleroderma ([place])   . Seasonal allergies   . Syncope    Multiple spells over the past 40+ years, likely neurocardiogenic  . Tobacco abuse 06/24/2009      Past Surgical History:  Procedure Laterality Date  . BILATERAL SALPINGOOPHORECTOMY  2013    Dr. Elonda Husky; uterus remains in situ  . BRONCHIAL BIOPSY  11/21/2019   Procedure: BRONCHIAL BIOPSIES;  Surgeon: Collene Gobble, MD;  Location: [facility] ENDOSCOPY;  Service: Pulmonary;;  . BRONCHIAL BRUSHINGS  11/21/2019   Procedure: BRONCHIAL BRUSHINGS;  Surgeon: Collene Gobble, MD;  Location: [facility] ENDOSCOPY;  Service: Pulmonary;;  . BRONCHIAL NEEDLE ASPIRATION BIOPSY  11/21/2019   Procedure: BRONCHIAL NEEDLE ASPIRATION BIOPSIES;  Surgeon: Collene Gobble, MD;  Location: [place];  Service: Pulmonary;;  . BRONCHIAL WASHINGS  11/21/2019   Procedure: BRONCHIAL WASHINGS;  Surgeon: Collene Gobble, MD;  Location: [facility] ENDOSCOPY;  Service: Pulmonary;;  . COLONOSCOPY  09/28/09   anal papilla otherwise normal  . COLONOSCOPY N/A 08/29/2012   VEH:MCNOBSJ polyp-removed as described above. tubular adenoma  . COLONOSCOPY WITH PROPOFOL N/A 11/11/2019   Procedure: COLONOSCOPY WITH PROPOFOL;  Surgeon: Daneil Dolin, MD;  [facility];  Service: Endoscopy;  Laterality: N/A;  2:30pm  . ESOPHAGOGASTRODUODENOSCOPY  10/07/09   Dr. Barrie Dunker of esophageal mucosa, diffusely ?esophagitis (bx benign), small HH/antral erosions, erythema bx benign. 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 ESOPHAGOGASTRODUODENOSCOPY (EGD) WITH ESOPHAGEAL DILATION N/A 03/07/2012   GGE:[id number], patent, tubular esophagus of uncertain significance-status post biopsy )unremarkable). Hiatal hernia  . FUDUCIAL PLACEMENT  10/18/2017   Procedure: PLACEMENT OF 3 FUDUCIAL INTO LEFT LOWER LOBE OF LUNG-TARGET 1;  Surgeon: Collene Gobble, MD;  Location: [place];  Service: Thoracic;;  .  LAPAROSCOPIC APPENDECTOMY  07/16/2011   Procedure: APPENDECTOMY LAPAROSCOPIC;  Surgeon: Donato Heinz, MD;  Location[facility] ORS;  Service: General;  Laterality: N/A;  . POLYPECTOMY  11/11/2019   Procedure: POLYPECTOMY;  Surgeon: Daneil Dolin, MD;  [facility];  Service: Endoscopy;;  . TUBAL LIGATION    . VIDEO BRONCHOSCOPY  10/18/2017  . VIDEO BRONCHOSCOPY WITH ENDOBRONCHIAL NAVIGATION N/A 10/18/2017   Procedure: VIDEO BRONCHOSCOPY WITH ENDOBRONCHIAL NAVIGATION;  Surgeon: Collene Gobble, MD;  Location: [place];  Service: Thoracic;  Laterality: N/A;  . VIDEO BRONCHOSCOPY WITH ENDOBRONCHIAL NAVIGATION Left 11/21/2019  Procedure: VIDEO BRONCHOSCOPY WITH ENDOBRONCHIAL NAVIGATION;  Surgeon: Collene Gobble, MD;  Location: [facility] ENDOSCOPY;  Service: Pulmonary;  Laterality: Left;      Social History:      Social History   Tobacco Use  . Smoking status: Current Every Day Smoker    Packs/day: 0.50    Years: 45.00    Pack years: 22.50    Types: Cigarettes    Start date: 07/14/1971  . Smokeless tobacco: Never Used  . Tobacco comment: 0.5 packs of cigarettes smoked daily ARJ 12/31/19  Substance Use Topics  . Alcohol use: Not Currently    Alcohol/week: 0.0 standard drinks    Comment: quit June 2012-used to drink 6 pack daily, occasional beer per pt 11/20/19       Family History :     Family History  Problem Relation Age of Onset  . Stroke Father   . Aneurysm Father   . Coronary artery disease Brother   . Hypertension Brother   . Down syndrome Brother   . Hypertension Brother   . Hypertension Brother        father/mother  . Aneurysm Mother   . Throat cancer Brother 49       throat cancer  . Hypertension Sister   . Diabetes Brother   . Diabetes Brother   . Anesthesia problems Neg Hx   . Hypotension Neg Hx   . Malignant hyperthermia Neg Hx   . Pseudochol deficiency Neg Hx   . Colon cancer Neg Hx       Home Medications:   Prior to Admission medications    Medication Sig Start Date End Date Taking? Authorizing Provider  acetaminophen (TYLENOL) 650 MG CR tablet Take 1,300 mg by mouth every 8 (eight) hours as needed for pain.    Yes [provider]  amLODipine (NORVASC) 5 MG tablet Take 1 tablet (5 mg total) by mouth daily. 04/17/19  Yes [name], MD  atorvastatin (LIPITOR) 10 MG tablet Take 1 tablet (10 mg total) by mouth daily. 10/16/19  Yes [name], MD  Cholecalciferol (VITAMIN D) 125 MCG (5000 UT) CAPS Take 5,000 Units by mouth daily.    Yes [provider]  DULoxetine (CYMBALTA) 60 MG capsule TAKE 1 CAPSULE EVERY DAY 01/07/20  Yes [name], MD  guaiFENesin-dextromethorphan (ROBITUSSIN DM) 100-10 MG/5ML syrup Take 5 mLs by mouth every 4 (four) hours as needed for cough. 12/12/19  Yes Swayze, Ava, DO  levothyroxine (SYNTHROID) 75 MCG tablet TAKE 1 TABLET BY MOUTH  EVERY OTHER DAY ALTERNATING WITH 1/2 TABLET EVERY OTHER DAY Patient taking differently: Take 75 mcg by mouth See admin instructions. Take 1 tablet by mouth every other day alternating wirh 1/2 tablet every other day 01/07/20  Yes [name], MD  Lidocaine-Menthol 4-1 % [facility] Apply 1 patch topically daily as needed (pain).    Yes [provider]  Magnesium 500 MG CAPS Take 1 capsule by mouth daily.    Yes [provider]  meloxicam (MOBIC) 7.5 MG tablet Take 7.5 mg by mouth daily.   Yes [provider]  montelukast (SINGULAIR) 10 MG tablet Take 1 tablet (10 mg total) by mouth at bedtime. 11/14/19  Yes [name], MD  Multiple Vitamin (MULTIVITAMIN WITH MINERALS) TABS tablet Take 1 tablet by mouth daily.   Yes [provider]  pantoprazole (PROTONIX) 40 MG tablet TAKE 1 TABLET TWICE DAILY 01/07/20  Yes [name], MD  potassium chloride (KLOR-CON 10)  10 MEQ tablet Take 1 tablet (10 mEq total) by mouth daily. 04/17/19  Yes [name], MD  PROAIR RESPICLICK [phone] Base)  MCG/ACT AEPB Inhale 2 puffs into the lungs 4 (four) times daily as needed. 08/07/18  Yes Collene Gobble, MD  hydrOXYzine (ATARAX/VISTARIL) 25 MG tablet Take 1 tablet (25 mg total) by mouth 3 (three) times daily as needed for anxiety or itching. Patient not taking: No sig reported 12/12/19   Swayze, Ava, DO  oxyCODONE (OXY IR/ROXICODONE) 5 MG immediate release tablet Take 1 tablet (5 mg total) by mouth every 4 (four) hours as needed for moderate pain. Patient not taking: No sig reported 12/12/19   Swayze, Ava, DO     Allergies:     Allergies  Allergen Reactions  . Aspirin Other (See Comments)    Nose bleeds     Physical Exam:   Vitals  Blood pressure 112/82, pulse 97, temperature 98.7 F (37.1 C), temperature source Oral, resp. rate 20, height 5\' 8"  (1.727 m), weight 63.5 kg, SpO2 98 %.  1.  General: Resting comfortably in bed  2. Psychiatric: Mood and behavior normal for situation, AO x 3  3. Neurologic: CN II-XII intact, moves all 4 extremities voluntarily, speech and language normal, no focal deficits on limited exam  4. HEENMT:  Head is atraumatic, normocephalic, pupils reactive, neck is supple, trachea is midline, mucous membranes mildly dry  5. Respiratory : Diminished in the lower lung fields, no wheezes, rhonchi, or crackles  6. Cardiovascular : Heart rate normal, rhythm regular, no murmurs rubs or gallops  7. Gastrointestinal:  Abdomen is soft, non distended, non tender to palpation, bowel sounds normal  8. Skin:  Skin is warm dry and intact without acute lesions on limited exam  9.Musculoskeletal:  No acute deformity, no calf tenderness, no peripheral edema    Data Review:    CBC Recent Labs  Lab 02/23/20 1537  WBC 18.4*  HGB 13.2  HCT 39.8  PLT 384  MCV 78.5*  MCH 26.0  MCHC 33.2  RDW 15.0   ------------------------------------------------------------------------------------------------------------------  Results for orders placed or  performed during the hospital encounter of 02/23/20 (from the past 48 hour(s))  CBC     Status: Abnormal   Collection Time: 02/23/20  3:37 PM  Result Value Ref Range   WBC 18.4 (H) 4.0 - 10.5 K/uL   RBC 5.07 3.87 - 5.11 MIL/uL   Hemoglobin 13.2 12.0 - 15.0 g/dL   HCT 39.8 36.0 - 46.0 %   MCV 78.5 (L) 80.0 - 100.0 fL   MCH 26.0 26.0 - 34.0 pg   MCHC 33.2 30.0 - 36.0 g/dL   RDW 15.0 11.5 - 15.5 %   Platelets 384 150 - 400 K/uL   nRBC 0.0 0.0 - 0.2 %    Comment: Performed at [facility], [address] [place], [place] [address]  Comprehensive metabolic panel     Status: Abnormal   Collection Time: 02/23/20  3:37 PM  Result Value Ref Range   Sodium 129 (L) 135 - 145 mmol/L   Potassium 5.3 (H) 3.5 - 5.1 mmol/L   Chloride 94 (L) 98 - 111 mmol/L   CO2 25 22 - 32 mmol/L   Glucose, Bld 121 (H) 70 - 99 mg/dL    Comment: Glucose reference range applies only to samples taken after fasting for at least 8 hours.   BUN 30 (H) 8 - 23 mg/dL   Creatinine, Ser 2.00 (H) 0.44 - 1.00  mg/dL   Calcium 9.9 8.9 - 10.3 mg/dL   Total Protein 8.3 (H) 6.5 - 8.1 g/dL   Albumin 4.6 3.5 - 5.0 g/dL   AST 40 15 - 41 U/L   ALT 36 0 - 44 U/L   Alkaline Phosphatase 64 38 - 126 U/L   Total Bilirubin 1.4 (H) 0.3 - 1.2 mg/dL   GFR, Estimated 27 (L) >60 mL/min    Comment: (NOTE) Calculated using the CKD-EPI Creatinine Equation (2021)    Anion gap 10 5 - 15    Comment: Performed at [facility], [address] [place], [place] [address]  Troponin I (High Sensitivity)     Status: Abnormal   Collection Time: 02/23/20  3:37 PM  Result Value Ref Range   Troponin I (High Sensitivity) 332 (HH) <18 ng/L    Comment: CRITICAL RESULT CALLED TO, READ BACK BY AND VERIFIED WITH: MICHAEL DOSS,RN@[id number]  02/23/2020 KAY (NOTE) Elevated high sensitivity troponin I (hsTnI) values and significant  changes across serial measurements may suggest ACS but many other  chronic and acute conditions are known to elevate hsTnI results.   Refer to the Links section for chest pain algorithms and additional  guidance. Performed at [facility], [address], [place], [place] [address]   SARS Coronavirus 2 by RT PCR (hospital order, performed in [facility] hospital lab) Nasopharyngeal Nasopharyngeal Swab     Status: None   Collection Time: 02/23/20  3:37 PM   Specimen: Nasopharyngeal Swab  Result Value Ref Range   SARS Coronavirus 2 NEGATIVE NEGATIVE    Comment: (NOTE) SARS-CoV-2 target nucleic acids are NOT DETECTED.  The SARS-CoV-2 RNA is generally detectable in upper and lower respiratory specimens during the acute phase of infection. The lowest concentration of SARS-CoV-2 viral copies this assay can detect is 250 copies / mL. A negative result does not preclude SARS-CoV-2 infection and should not be used as the sole basis for treatment or other patient management decisions.  A negative result may occur with improper specimen collection / handling, submission of specimen other than nasopharyngeal swab, presence of viral mutation(s) within the areas targeted by this assay, and inadequate number of viral copies (<250 copies / mL). A negative result must be combined with clinical observations, patient history, and epidemiological information.  Fact Sheet for Patients:   StrictlyIdeas.no  Fact Sheet for Healthcare Providers: BankingDealers.co.za  This test is not yet approved or  cleared by the [place] FDA and has been authorized for detection and/or diagnosis of SARS-CoV-2 by FDA under an Emergency Use Authorization (EUA).  This EUA will remain in effect (meaning this test can be used) for the duration of the COVID-19 declaration under Section 564(b)(1) of the Act, 21 U.S.C. section 360bbb-3(b)(1), unless the authorization is terminated or revoked sooner.  Performed at [facility], [address] [place], [place] [address]   Lipase, blood     Status:  Abnormal   Collection Time: 02/23/20  3:37 PM  Result Value Ref Range   Lipase 80 (H) 11 - 51 U/L    Comment: Performed at [facility], [address] [place], [place] [address]  Troponin I (High Sensitivity)     Status: Abnormal   Collection Time: 02/23/20  5:31 PM  Result Value Ref Range   Troponin I (High Sensitivity) 247 (HH) <18 ng/L    Comment: DELTA CHECK NOTED CRITICAL VALUE NOTED.  VALUE IS CONSISTENT WITH PREVIOUSLY REPORTED AND CALLED VALUE. (NOTE) Elevated high sensitivity troponin I (hsTnI) values and  significant  changes across serial measurements may suggest ACS but many other  chronic and acute conditions are known to elevate hsTnI results.  Refer to the Links section for chest pain algorithms and additional  guidance. Performed at [facility], [address] [place], [place] [address]   Blood culture (routine x 2)     Status: None (Preliminary result)   Collection Time: 02/23/20  5:31 PM   Specimen: BLOOD RIGHT HAND  Result Value Ref Range   Specimen Description      BLOOD RIGHT HAND BOTTLES DRAWN AEROBIC AND ANAEROBIC   Special Requests      Blood Culture adequate volume Performed at [facility], [address] [place], [place] [address]    Culture PENDING    Report Status PENDING   Lactic acid, plasma     Status: None   Collection Time: 02/23/20  5:31 PM  Result Value Ref Range   Lactic Acid, Venous 1.3 0.5 - 1.9 mmol/L    Comment: Performed at [facility], [address] [place], [place] [address]  Blood culture (routine x 2)     Status: None (Preliminary result)   Collection Time: 02/23/20  5:37 PM   Specimen: Right Antecubital; Blood  Result Value Ref Range   Specimen Description      RIGHT ANTECUBITAL BOTTLES DRAWN AEROBIC AND ANAEROBIC   Special Requests      Blood Culture adequate volume Performed at [address] [place], [place] [address]    Culture PENDING    Report Status PENDING   Urinalysis, Routine w reflex microscopic  Urine, Clean Catch     Status: None   Collection Time: 02/23/20  7:56 PM  Result Value Ref Range   Color, Urine YELLOW YELLOW   APPearance CLEAR CLEAR   Specific Gravity, Urine 1.012 1.005 - 1.030   pH 7.0 5.0 - 8.0   Glucose, UA NEGATIVE NEGATIVE mg/dL   Hgb urine dipstick NEGATIVE NEGATIVE   Bilirubin Urine NEGATIVE NEGATIVE   Ketones, ur NEGATIVE NEGATIVE mg/dL   Protein, ur NEGATIVE NEGATIVE mg/dL   Nitrite NEGATIVE NEGATIVE   Leukocytes,Ua NEGATIVE NEGATIVE    Comment: Performed at [facility], [address] [place], [place] [address]    Chemistries  Recent Labs  Lab 02/23/20 1537  NA 129*  K 5.3*  CL 94*  CO2 25  GLUCOSE 121*  BUN 30*  CREATININE 2.00*  CALCIUM 9.9  AST 40  ALT 36  ALKPHOS 64  BILITOT 1.4*   ------------------------------------------------------------------------------------------------------------------  ------------------------------------------------------------------------------------------------------------------ GFR: Estimated Creatinine Clearance: 28.5 mL/min (A) (by C-G formula based on SCr of 2 mg/dL (H)). Liver Function Tests: Recent Labs  Lab 02/23/20 1537  AST 40  ALT 36  ALKPHOS 64  BILITOT 1.4*  PROT 8.3*  ALBUMIN 4.6   Recent Labs  Lab 02/23/20 1537  LIPASE 80*   No results for input(s): AMMONIA in the last 168 hours. Coagulation Profile: No results for input(s): INR, PROTIME in the last 168 hours. Cardiac Enzymes: No results for input(s): CKTOTAL, CKMB, CKMBINDEX, TROPONINI in the last 168 hours. BNP (last 3 results) No results for input(s): PROBNP in the last 8760 hours. HbA1C: No results for input(s): HGBA1C in the last 72 hours. CBG: No results for input(s): GLUCAP in the last 168 hours. Lipid Profile: No results for input(s): CHOL, HDL, LDLCALC, TRIG, CHOLHDL, LDLDIRECT in the last 72 hours. Thyroid Function Tests: No results for input(s): TSH, T4TOTAL, FREET4, T3FREE, THYROIDAB in the last 72  hours. Anemia Panel: No results for input(s): VITAMINB12, FOLATE, FERRITIN, TIBC, IRON, RETICCTPCT in the last 72 hours.  --------------------------------------------------------------------------------------------------------------- Urine analysis:    Component Value Date/Time   COLORURINE YELLOW 02/23/2020 1956   APPEARANCEUR CLEAR 02/23/2020 1956   LABSPEC 1.012 02/23/2020 1956   PHURINE 7.0 02/23/2020 1956   GLUCOSEU NEGATIVE 02/23/2020 1956   HGBUR NEGATIVE 02/23/2020 1956   BILIRUBINUR NEGATIVE 02/23/2020 1956   BILIRUBINUR negative 12/19/2017 1549   KETONESUR NEGATIVE 02/23/2020 1956   PROTEINUR NEGATIVE 02/23/2020 1956   UROBILINOGEN 2.0 (A) 12/19/2017 1549   UROBILINOGEN 0.2 12/15/2013 1335   NITRITE NEGATIVE 02/23/2020 1956   LEUKOCYTESUR NEGATIVE 02/23/2020 1956      Imaging Results:    CT Abdomen Pelvis Wo Contrast  Result Date: 02/23/2020 CLINICAL DATA:  Generalized weakness x3 weeks, right lower quadrant pain x1 week. Positive for nausea vomiting and diarrhea 1 week ago. History of hypertension, lung cancer and appendectomy. EXAM: CT ABDOMEN AND PELVIS WITHOUT CONTRAST TECHNIQUE: Multidetector CT imaging of the abdomen and pelvis was performed following the standard protocol without IV contrast. COMPARISON:  PET-CT December 09, 2019 and CT abdomen and pelvis March 28, 2018. FINDINGS: Lower chest: No acute abnormality. Suspected fiducial marker in the left lung base. Hepatobiliary: Unremarkable noncontrast appearance of the hepatic parenchyma. Gallbladder is is distended without associated inflammatory findings. No biliary ductal dilatation. Pancreas: Unremarkable. No pancreatic ductal dilatation or surrounding inflammatory changes. Spleen: Normal in size without focal abnormality. Adrenals/Urinary Tract: Adrenal glands are unremarkable. Kidneys are normal, without renal calculi, focal lesion, or hydronephrosis. Bladder is unremarkable. Stomach/Bowel: Stomach is within normal  limits. Duodenal diverticulum. Appendix is surgically absent by report. No evidence of bowel wall thickening, distention, or inflammatory changes. Vascular/Lymphatic: Aortic atherosclerosis. No enlarged abdominal or pelvic lymph nodes. Reproductive: Uterus and bilateral adnexa are unremarkable. Other: No abdominopelvic ascites. Musculoskeletal: No acute or significant osseous findings. Multilevel degenerative changes spine. IMPRESSION: 1. No acute abnormality in the abdomen or pelvis. 2. Gallbladder is distended without associated inflammatory findings, commonly seen in fasting state. 3. Aortic atherosclerosis. Aortic Atherosclerosis (ICD10-I70.0). Electronically Signed   By: Dahlia Bailiff MD   On: 02/23/2020 20:09   DG Chest Port 1 View  Result Date: 02/23/2020 CLINICAL DATA:  Generalized weakness. EXAM: PORTABLE CHEST 1 VIEW COMPARISON:  December 31, 2019 FINDINGS: Tortuosity of the aorta. Cardiomediastinal silhouette is normal. Mediastinal contours appear intact. Increased attenuation of the left lung apex and nodular opacity in the left lower lobe are stable. No significant airspace consolidation. Osseous structures are without acute abnormality. Soft tissues are grossly normal. IMPRESSION: 1. Stable increased attenuation of the left lung apex and pulmonary nodule in the left lower lobe. 2. No significant airspace consolidation. Electronically Signed   By: Fidela Salisbury M.D.   On: 02/23/2020 16:17    My personal review of EKG: Rhythm NSR, Rate 88 /min, QTc 504,no Acute ST changes   Assessment & Plan:    Active Problems:   AKI (acute kidney injury) ([place])   1. AKI 1. Cr 2.00 2. 2/2 hypovolemic status 3. 1 L NaCl in ED 4. Continue IV hydration 2. Leukocytosis 1. WBC 18 2. UA is not indicative of UTI 3. Blood cultures pending 4. Repeat CXR after adequate hydration 5. CT abd/Pelvis - Gall bladder distended without inflammation - likely chronic 6. Covered with cefepime and  flagyl 7. Holding on antibiotics until procal results 8. Lipase 80 - but no pancreatitis on CT 9. Lactic acid 1.3 3. Elevated Troponin 1. Down  trending from 332 to 247 2. No chest pain 3. EKG has some T wave inversion 4. Monitor on tele 5. Trop with AM labs 4. Hyponatremia 1. Na 129 2. Hypovolemic hyponatremia 3. 1L Bolus 4. Continue NaCl 176ml/hour 5. Continue to monitor 5. HTN  1. Continue amlodipine 2. Has not been adherent to this medication at home 6. Hypothyroid 1. Check TSH 2. Continue synthroid 7. HLD 1. Continue statin   DVT Prophylaxis-   heparin- SCDs   AM Labs Ordered, also please review Full Orders  Family Communication: No family at bedside  Code Status:  Full  Admission status: Observation  Time spent in minutes : [place] DO

## 2020-02-24 ENCOUNTER — Observation Stay (HOSPITAL_COMMUNITY): Payer: Medicare HMO

## 2020-02-24 DIAGNOSIS — G8929 Other chronic pain: Secondary | ICD-10-CM | POA: Diagnosis present

## 2020-02-24 DIAGNOSIS — Z79890 Hormone replacement therapy (postmenopausal): Secondary | ICD-10-CM | POA: Diagnosis not present

## 2020-02-24 DIAGNOSIS — Z20822 Contact with and (suspected) exposure to covid-19: Secondary | ICD-10-CM | POA: Diagnosis present

## 2020-02-24 DIAGNOSIS — R7989 Other specified abnormal findings of blood chemistry: Secondary | ICD-10-CM

## 2020-02-24 DIAGNOSIS — W19XXXA Unspecified fall, initial encounter: Secondary | ICD-10-CM | POA: Diagnosis present

## 2020-02-24 DIAGNOSIS — E039 Hypothyroidism, unspecified: Secondary | ICD-10-CM | POA: Diagnosis present

## 2020-02-24 DIAGNOSIS — R911 Solitary pulmonary nodule: Secondary | ICD-10-CM | POA: Diagnosis not present

## 2020-02-24 DIAGNOSIS — E861 Hypovolemia: Secondary | ICD-10-CM | POA: Diagnosis present

## 2020-02-24 DIAGNOSIS — J449 Chronic obstructive pulmonary disease, unspecified: Secondary | ICD-10-CM | POA: Diagnosis present

## 2020-02-24 DIAGNOSIS — E860 Dehydration: Secondary | ICD-10-CM

## 2020-02-24 DIAGNOSIS — R531 Weakness: Secondary | ICD-10-CM | POA: Diagnosis not present

## 2020-02-24 DIAGNOSIS — N179 Acute kidney failure, unspecified: Secondary | ICD-10-CM | POA: Diagnosis present

## 2020-02-24 DIAGNOSIS — E038 Other specified hypothyroidism: Secondary | ICD-10-CM | POA: Diagnosis not present

## 2020-02-24 DIAGNOSIS — Z86010 Personal history of colonic polyps: Secondary | ICD-10-CM | POA: Diagnosis not present

## 2020-02-24 DIAGNOSIS — F32A Depression, unspecified: Secondary | ICD-10-CM | POA: Diagnosis present

## 2020-02-24 DIAGNOSIS — M549 Dorsalgia, unspecified: Secondary | ICD-10-CM | POA: Diagnosis present

## 2020-02-24 DIAGNOSIS — F17210 Nicotine dependence, cigarettes, uncomplicated: Secondary | ICD-10-CM | POA: Diagnosis present

## 2020-02-24 DIAGNOSIS — E785 Hyperlipidemia, unspecified: Secondary | ICD-10-CM | POA: Diagnosis present

## 2020-02-24 DIAGNOSIS — Z91048 Other nonmedicinal substance allergy status: Secondary | ICD-10-CM | POA: Diagnosis not present

## 2020-02-24 DIAGNOSIS — F419 Anxiety disorder, unspecified: Secondary | ICD-10-CM | POA: Diagnosis present

## 2020-02-24 DIAGNOSIS — Z8249 Family history of ischemic heart disease and other diseases of the circulatory system: Secondary | ICD-10-CM | POA: Diagnosis not present

## 2020-02-24 DIAGNOSIS — I2510 Atherosclerotic heart disease of native coronary artery without angina pectoris: Secondary | ICD-10-CM | POA: Diagnosis present

## 2020-02-24 DIAGNOSIS — R778 Other specified abnormalities of plasma proteins: Secondary | ICD-10-CM

## 2020-02-24 DIAGNOSIS — C3490 Malignant neoplasm of unspecified part of unspecified bronchus or lung: Secondary | ICD-10-CM | POA: Diagnosis present

## 2020-02-24 DIAGNOSIS — D72829 Elevated white blood cell count, unspecified: Secondary | ICD-10-CM | POA: Diagnosis not present

## 2020-02-24 DIAGNOSIS — I10 Essential (primary) hypertension: Secondary | ICD-10-CM

## 2020-02-24 DIAGNOSIS — M1990 Unspecified osteoarthritis, unspecified site: Secondary | ICD-10-CM | POA: Diagnosis present

## 2020-02-24 DIAGNOSIS — E871 Hypo-osmolality and hyponatremia: Secondary | ICD-10-CM | POA: Diagnosis present

## 2020-02-24 DIAGNOSIS — K219 Gastro-esophageal reflux disease without esophagitis: Secondary | ICD-10-CM | POA: Diagnosis present

## 2020-02-24 DIAGNOSIS — M349 Systemic sclerosis, unspecified: Secondary | ICD-10-CM | POA: Diagnosis present

## 2020-02-24 DIAGNOSIS — Z79899 Other long term (current) drug therapy: Secondary | ICD-10-CM | POA: Diagnosis not present

## 2020-02-24 LAB — COMPREHENSIVE METABOLIC PANEL
ALT: 26 U/L (ref 0–44)
AST: 26 U/L (ref 15–41)
Albumin: 3.4 g/dL — ABNORMAL LOW (ref 3.5–5.0)
Alkaline Phosphatase: 49 U/L (ref 38–126)
Anion gap: 8 (ref 5–15)
BUN: 24 mg/dL — ABNORMAL HIGH (ref 8–23)
CO2: 24 mmol/L (ref 22–32)
Calcium: 8.4 mg/dL — ABNORMAL LOW (ref 8.9–10.3)
Chloride: 99 mmol/L (ref 98–111)
Creatinine, Ser: 1.66 mg/dL — ABNORMAL HIGH (ref 0.44–1.00)
GFR, Estimated: 34 mL/min — ABNORMAL LOW (ref >60)
Glucose, Bld: 95 mg/dL (ref 70–99)
Potassium: 4.5 mmol/L (ref 3.5–5.1)
Sodium: 131 mmol/L — ABNORMAL LOW (ref 135–145)
Total Bilirubin: 0.6 mg/dL (ref 0.3–1.2)
Total Protein: 6.1 g/dL — ABNORMAL LOW (ref 6.5–8.1)

## 2020-02-24 LAB — ECHOCARDIOGRAM COMPLETE
AR max vel: 2.7 cm2
AV Area VTI: 2.87 cm2
AV Area mean vel: 2.46 cm2
AV Mean grad: 3.6 mmHg
AV Peak grad: 6.2 mmHg
Ao pk vel: 1.25 m/s
Area-P 1/2: 9.6 cm2
Height: 68 in
S' Lateral: 2.47 cm
Weight: 2074.09 oz

## 2020-02-24 LAB — CBC
HCT: 30.9 % — ABNORMAL LOW (ref 36.0–46.0)
Hemoglobin: 10 g/dL — ABNORMAL LOW (ref 12.0–15.0)
MCH: 25.9 pg — ABNORMAL LOW (ref 26.0–34.0)
MCHC: 32.4 g/dL (ref 30.0–36.0)
MCV: 80.1 fL (ref 80.0–100.0)
Platelets: 327 10*3/uL (ref 150–400)
RBC: 3.86 MIL/uL — ABNORMAL LOW (ref 3.87–5.11)
RDW: 15.2 % (ref 11.5–15.5)
WBC: 11.1 10*3/uL — ABNORMAL HIGH (ref 4.0–10.5)
nRBC: 0 % (ref 0.0–0.2)

## 2020-02-24 LAB — MAGNESIUM: Magnesium: 0.7 mg/dL — CL (ref 1.7–2.4)

## 2020-02-24 LAB — VITAMIN B12: Vitamin B-12: 421 pg/mL (ref 180–914)

## 2020-02-24 LAB — TSH: TSH: 1.239 u[IU]/mL (ref 0.350–4.500)

## 2020-02-24 MED ORDER — MIRTAZAPINE 15 MG PO TABS
7.5000 mg | ORAL_TABLET | Freq: Every day | ORAL | Status: DC
  Administered 2020-02-24: 7.5 mg via ORAL
  Filled 2020-02-24: qty 1

## 2020-02-24 MED ORDER — SODIUM CHLORIDE 0.9 % IV SOLN: INTRAVENOUS | Status: AC

## 2020-02-24 MED ORDER — MAGNESIUM OXIDE 400 (241.3 MG) MG PO TABS
400.0000 mg | ORAL_TABLET | Freq: Every day | ORAL | Status: DC
  Administered 2020-02-24 – 2020-02-25 (×2): 400 mg via ORAL
  Filled 2020-02-24 (×2): qty 1

## 2020-02-24 MED ORDER — MAGNESIUM SULFATE 50 % IJ SOLN: 3.0000 g | Freq: Once | INTRAMUSCULAR | Status: DC

## 2020-02-24 MED ORDER — OXYCODONE HCL 5 MG PO TABS: 5.0000 mg | ORAL_TABLET | Freq: Four times a day (QID) | ORAL | Status: DC | PRN

## 2020-02-24 MED ORDER — AMLODIPINE BESYLATE 5 MG PO TABS: 2.5000 mg | ORAL_TABLET | Freq: Every day | ORAL | Status: DC

## 2020-02-24 MED ORDER — MAGNESIUM SULFATE 4 GM/100ML IV SOLN
4.0000 g | Freq: Once | INTRAVENOUS | Status: AC
  Administered 2020-02-24: 4 g via INTRAVENOUS
  Filled 2020-02-24: qty 100

## 2020-02-24 NOTE — Progress Notes (Signed)
  Echocardiogram 2D Echocardiogram has been performed.  Kelly Douglas 02/24/2020, 11:33 AM

## 2020-02-24 NOTE — Evaluation (Signed)
Physical Therapy Evaluation Patient Details Name: Kelly Douglas MRN: [id number] DOB: 12/30/1955 Today's Date: 02/24/2020   History of Present Illness  Kelly Douglas  is a 65 y.o. female, with history of tobacco abuse, syncope, scleroderma, hypothyroid, HTN, HLD, GERD, and depression/anxiety, presents to the ED with a chief complaint of weakness. Patient reports that it started 3 weeks ago, when her brother died. She reports that at the beginning of the three weeks, she had nausea and vomiting. The nausea was worse every time she tried to eat. She has abdominal pain that is a separate issue and has been present for 42 years. It is no different than her normal abdominal pain. She reports that she is no longer having nausea and vomiting, but she still has no appetite. She has not had a normal meal since December, per her report. She reports that she came in today because she has been feeling weak today, and she fell down 2/2 weakness. She was not able to stand up without support after the fall. She is not on blood thinners, and she has not hit her head. The weakness feels like it did when she had hypokalemia in the past. Patient reports that she has had decreased urine output and darker urine. No hematuria. Patient reports that she attributed the decrease in urine output to decrease in PO intake. Patient reports that she has felt feverish, but has not had any measured fevers. Patient admits to her normal smoker's cough. She has no other complaints at this time.      Of Note - patient is very focused on the loss of several family members. Despite attempts to deflect, she has recounted the deaths of each of her siblings, and the last brother just died a few weeks ago. She reports that he had Down's and she spent each day taking care of him. It is quite possible that her lack in appetite and GI symptoms are 2/2 depression.    Clinical Impression  Pt admitted with above diagnosis. Patient agreeable to  participating in PT evaluation today. Patient continues to exhibit extraneous muscle movement and shakiness with movement. Patient does well with bed mobility. Patient requires assistance for transfers and ambulation and would be safest at this point in time to utilize a RW to increase her independence and mobility as she gains her strength back. Patient requires cues for sequencing of steps and placement of hands so will require further instruction with use of RW. Patient agreeable to sitting in chair at end of session. Patient would benefit from skilled outpatient physical therapy after DC from current venue to continue to build her strength, balance and independence, as well as interact with those in her community. Patient reports she does not drive but her niece would be willing to take her to appointments.  Pt currently with functional limitations due to the deficits listed below (see PT Problem List). Pt will benefit from skilled PT to increase their independence and safety with mobility to allow discharge to the venue listed below.       Follow Up Recommendations Home health PT;Outpatient PT;Supervision - Intermittent;Supervision for mobility/OOB    Equipment Recommendations  Rolling walker with 5" wheels    Recommendations for Other Services       Precautions / Restrictions Precautions Precautions: Fall Precaution Comments: patient reports 8 falls in last 6 months; with 5 in the last week. Restrictions Weight Bearing Restrictions: No      Mobility  Bed Mobility Overal bed mobility: Modified  Independent     Transfers Overall transfer level: Needs assistance Equipment used: Ambulation equipment used Transfers: Sit to/from [facility] Sit to Stand: Min guard Stand pivot transfers: Min guard       General transfer comment: Heavy use of upper extremities for power up and 3 attempts to complete from bedside; hip extension last  Ambulation/Gait Ambulation/Gait  assistance: Min guard;Min assist Gait Distance (Feet): 150 Feet Assistive device: IV Pole;1 person hand held assist Gait Pattern/deviations: Step-through pattern;Decreased step length - right;Decreased step length - left;Decreased stride length;Wide base of support Gait velocity: decreased   General Gait Details: slow, unsteady, labored gait requiring IV pole and one hand held as patient would reach for objects and railings without one hand held; on room air; cues for sequencing of steps and placement of hands; limited by fatigue.  Stairs     Wheelchair Mobility    Modified Rankin (Stroke Patients Only)       Balance Overall balance assessment: Needs assistance Sitting-balance support: Bilateral upper extremity supported;Feet supported Sitting balance-Leahy Scale: Good     Standing balance support: No upper extremity supported;During functional activity Standing balance-Leahy Scale: Poor Standing balance comment: poor without assistive device; fair with IV pole and one hand held         Pertinent Vitals/Pain Pain Assessment: 0-10 Pain Score: 5  Pain Location: forehead area where she hit her head when fell last week Pain Descriptors / Indicators: Sore Pain Intervention(s): Limited activity within patient's tolerance;Monitored during session    Home Living Family/patient expects to be discharged to:: Private residence Living Arrangements: Other relatives;Spouse/significant other (husband and patient's brother) Available Help at Discharge: Family;Available 24 hours/day Type of Home: House Home Access: Stairs to enter Entrance Stairs-Rails: Psychiatric nurse of Steps: 1 Home Layout: One level Home Equipment: Walker - standard Additional Comments: patient reports she has never driven    Prior Function Level of Independence: Independent         Comments: prior to death of brother in 26-Nov-2019, patient independent with all ADLs, ambulating without  assistive devices. More recently began walking with a standard walker as her physical level decreased     Hand Dominance   Dominant Hand: Right    Extremity/Trunk Assessment   Upper Extremity Assessment Upper Extremity Assessment: Generalized weakness    Lower Extremity Assessment Lower Extremity Assessment: Generalized weakness       Communication   Communication: No difficulties  Cognition Arousal/Alertness: Awake/alert Behavior During Therapy: WFL for tasks assessed/performed Overall Cognitive Status: Within Functional Limits for tasks assessed      General Comments      Exercises     Assessment/Plan    PT Assessment Patient needs continued PT services  PT Problem List Decreased strength;Decreased knowledge of use of DME;Decreased activity tolerance;Decreased balance;Decreased mobility       PT Treatment Interventions DME instruction;Balance training;Gait training;Neuromuscular re-education;Stair training;Patient/family education;Therapeutic activities;Therapeutic exercise    PT Goals (Current goals can be found in the Care Plan section)  Acute Rehab PT Goals Patient Stated Goal: Go home. PT Goal Formulation: With patient Time For Goal Achievement: 03/09/20 Potential to Achieve Goals: Good    Frequency Min 3X/week   Barriers to discharge           AM-PAC PT "6 Clicks" Mobility  Outcome Measure Help needed turning from your back to your side while in a flat bed without using bedrails?: A Little Help needed moving from lying on your back to sitting on the  side of a flat bed without using bedrails?: A Little Help needed moving to and from a bed to a chair (including a wheelchair)?: A Little Help needed standing up from a chair using your arms (e.g., wheelchair or bedside chair)?: A Little Help needed to walk in hospital room?: A Little Help needed climbing 3-5 steps with a railing? : A Lot 6 Click Score: 17    End of Session   Activity Tolerance:  Patient limited by fatigue Patient left: in chair;with call bell/phone within reach;with chair alarm set Nurse Communication: Mobility status PT Visit Diagnosis: Unsteadiness on feet (R26.81);Other abnormalities of gait and mobility (R26.89);History of falling (Z91.81);Muscle weakness (generalized) (M62.81)    Time: 7703-4035 PT Time Calculation (min) (ACUTE ONLY): 25 min   Charges:   PT Evaluation $PT Eval Low Complexity: 1 Low PT Treatments $Therapeutic Activity: 8-22 mins        Floria Raveling. Hartnett-Rands, MS, PT Per [place] [phone] 02/24/2020, 1:32 PM

## 2020-02-24 NOTE — Progress Notes (Signed)
CRITICAL VALUE ALERT  Critical Value:  Magnesium 0.7  Date & Time Notied:  02/24/20 9:48  Provider Notified: Barton Dubois MD  Orders Received/Actions taken: Waiting for new orders

## 2020-02-24 NOTE — TOC Initial Note (Signed)
Transition of Care [facility]) - Initial/Assessment Note    Patient Details  Name: Kelly Douglas MRN: [id number] Date of Birth: 10-04-1955  Transition of Care [facility]) CM/SW Contact:    Natasha Bence, LCSW Phone Number: 02/24/2020, 5:19 PM  Clinical Narrative:                 Patient is 65 year old female admitted for Acute renal failure. Patient lives independently, but needs assistance performing ADL's due to difficulties with mobility. Patient agreeable to [facility]. CSW referred patient to [facility]. Georgina Snell with [facility] agreeable to provide [facility] services to patient. TOC to follow.  Expected Discharge Plan: [place] Barriers to Discharge: Continued Medical Work up   Patient Goals and CMS Choice Patient states their goals for this hospitalization and ongoing recovery are:: Return home with [facility] CMS Medicare.gov Compare Post Acute Care list provided to:: Patient Choice offered to / list presented to : Patient  Expected Discharge Plan and Services Expected Discharge Plan: [place] Choice: [place] arrangements for the past 2 months: Single Family Home                 DME Arranged: N/A DME Agency: NA       HH Arranged: PT [place] Agency: [facility] [employer] [facility] [employer] Date [place]: 02/24/20 Time [place]: 1656 Representative spoke with at [place]: [place] Arrangements/Services Living arrangements for the past 2 months: [place] Lives with:: Self Patient language and need for interpreter reviewed:: Yes Do you feel safe going back to the place where you live?: Yes      Need for Family Participation in Patient Care: Yes (Comment) Care giver support system in place?: Yes (comment)   Criminal Activity/Legal Involvement Pertinent to Current Situation/Hospitalization: No - Comment as needed  Activities of Daily Living Home Assistive Devices/Equipment: None ADL  Screening (condition at time of admission) Patient's cognitive ability adequate to safely complete daily activities?: Yes Is the patient deaf or have difficulty hearing?: No Does the patient have difficulty seeing, even when wearing glasses/contacts?: No Does the patient have difficulty concentrating, remembering, or making decisions?: No Patient able to express need for assistance with ADLs?: Yes Does the patient have difficulty dressing or bathing?: No Independently performs ADLs?: Yes (appropriate for developmental age) Does the patient have difficulty walking or climbing stairs?: No Weakness of Legs: None Weakness of Arms/Hands: None  Permission Sought/Granted Permission sought to share information with : Family Supports Permission granted to share information with : Yes, Verbal Permission Granted     Permission granted to share info w AGENCY: Local HH        Emotional Assessment Appearance:: Appears stated age   Affect (typically observed): Accepting,Adaptable Orientation: : Oriented to Self,Oriented to Situation,Oriented to Place,Oriented to  Time Alcohol / Substance Use: Not Applicable Psych Involvement: No (comment)  Admission diagnosis:  Dehydration [E86.0] Leukocytosis [D72.829] Nausea [R11.0] Elevated troponin [R77.8] Poor fluid intake [R63.8] Generalized weakness [R53.1] AKI (acute kidney injury) ([place]) [N17.9] Other elevated white blood cell (WBC) count [D72.828] Acute renal failure (ARF) ([place]) [N17.9] Patient Active Problem List   Diagnosis Date Noted  . Acute renal failure (ARF) ([place]) 02/24/2020  . AKI (acute kidney injury) ([place]) 02/23/2020  . COPD (chronic obstructive pulmonary disease) ([place]) 12/31/2019  . Pneumothorax on left 12/10/2019  . Thrombocytosis 12/10/2019  . Hx of adenomatous colonic polyps  09/03/2019  . Cervical lymphadenopathy 05/16/2019  . Hypokalemia 03/28/2018  . Hypomagnesemia 03/28/2018  . S/P bronchoscopy 10/18/2017  . Adenocarcinoma  of left lung, stage 1 ([place]) 11/18/2016  . Pollen allergies 05/19/2015  . Nicotine dependence 09/17/2013  . Hoarseness of voice 09/16/2013  . Cervical pain (neck) 06/21/2013  . Back pain with radiation 06/21/2013  . IDA (iron deficiency anemia) 08/10/2012  . HTN (hypertension) 02/22/2012  . Insomnia 05/09/2011  . GERD (gastroesophageal reflux disease)   . Scleroderma ([place])   . Hypothyroidism   . Arteriosclerotic cardiovascular disease (ASCVD)   . Syncope   . Anxiety and depression 06/24/2009  . Vitamin D deficiency 03/18/2009  . Hyperlipidemia 03/18/2009   PCP:  [name], MD Pharmacy:   [facility], [place] [address] AT [address]. [place] [address] Phone: [phone] Fax: [phone]  [facility] - [place], [address] [place] [address] Phone: [phone] Fax: [phone]     Social Determinants of Health (SDOH) Interventions    Readmission Risk Interventions No flowsheet data found.

## 2020-02-24 NOTE — Progress Notes (Signed)
PROGRESS NOTE    Kelly Douglas  KXF:[id number] DOB: November 20, 1955 DOA: 02/23/2020 PCP: [name], MD   Chief Complaint  Patient presents with  . Weakness    Brief admission narrative:  As per H&P written by Dr. Clearence Ped on  02/23/20 Kelly Douglas  is a 65 y.o. female, with history of tobacco abuse, syncope, scleroderma, hypothyroid, HTN, HLD, GERD, and depression/anxiety, presents to the ED with a chief complaint of weakness. Patient reports that it started 3 weeks ago, when her brother died. She reports that at the beginning of the three weeks, she had nausea and vomiting. The nausea was worse every time she tried to eat. She has abdominal pain that is a separate issue and has been present for 42 years. It is no different than her normal abdominal pain. She reports that she is no longer having nausea and vomiting, but she still has no appetite. She has not had a normal meal since December, per her report. She reports that she came in today because she has been feeling weak today, and she fell down 2/2 weakness. She was not able to stand up without support after the fall. She is not on blood thinners, and she has not hit her head. The weakness feels like it did when she had hypokalemia in the past. Patient reports that she has had decreased urine output and darker urine. No hematuria. Patient reports that she attributed the decrease in urine output to decrease in PO intake. Patient reports that she has felt feverish, but has not had any measured fevers. Patient admits to her normal smoker's cough. She has no other complaints at this time.    Assessment & Plan: 1-acute renal failure -Used to be in the setting of prerenal azotemia, dehydration and hypovolemia. -Will continue IV fluids lactated -Follow renal function trend -Continue to minimize nephrotoxic agents and avoid hypotension.  2-essential hypertension -We will hold amlodipine at this moment -Patient blood pressure is soft  but she has expressed feeling dizzy when changing position -Will check orthostatic vital signs  3-hyponatremia/dehydration -Improving with IV fluid -Continue to follow electrolytes trend.  4-elevated troponin--no chest pain, no shortness of breath -Probably associated with ongoing dehydration and acute renal failure. -Follow 2D echo.  5-hypothyroidism -TSH within normal limits -Continue Synthroid.  6-hyperlipidemia -Continue statin.  7-physical deconditioning -Patient has been seen by physical therapy with recommendation for home health services -Holding warfarin has been recommended.  8-tobacco abuse -cessation counseling provided  9-hx of Lung cancer -continue outpatient follow up with pulmonologist  10-depression -no SI or hallucinations -will continue cymbalta and add low dose remeron.   DVT prophylaxis: Heparin Code Status: Full code Family Communication: No family at bedside.  Patient expressed that she will contact her husband/son and give them updates. Disposition:   Status is: Inpatient  Dispo: The patient is from: home               Anticipated d/c is to: home with home health services               Anticipated d/c date is: 1 day              Patient currently no medically stable for discharge; significant hypomagnesemia appreciated on blood work.  Renal function is still not back to her baseline.  Soft BP appreciated; expressed feeling slightly dizzy when changing positions. .   Difficult to place patient: no    Consultants:   None   Procedures:  See  below for x-ray Reports   Antimicrobials:  None   Subjective: Soft blood pressure appreciated throughout the day; patient is still feeling weak, deconditioned ambulating much.  Denies chest pain, no nausea vomiting.  Objective: Vitals:   02/24/20 0206 02/24/20 0631 02/24/20 0927 02/24/20 1000  BP: 98/70 (!) 83/56 (!) 88/69 98/68  Pulse: 88 89 92 85  Resp: 17 16  17   Temp:    98.1 F (36.7  C)  TempSrc:    Oral  SpO2: 100% 100%  100%  Weight:      Height:        Intake/Output Summary (Last 24 hours) at 02/24/2020 1522 Last data filed at 02/24/2020 1300 Gross per 24 hour  Intake 2177.95 ml  Output -  Net 2177.95 ml   Filed Weights   02/23/20 1530 02/24/20 0008  Weight: 63.5 kg 58.8 kg    Examination:  General exam: Appears calm and afebrile; reports no chest pain, no nausea, no vomiting.  Feeling weak and deconditioned.  Patient expressed ongoing decreased appetite.  No abdominal pain. Respiratory system: Clear to auscultation. Respiratory effort normal.  No using accessory muscle.  No requiring supplementation. Cardiovascular system: S1 & S2 heard, regular rate and rhythm; no rubs, no gallops, no JVD. Gastrointestinal system: Abdomen is nondistended, soft and nontender. No organomegaly or masses felt. Normal bowel sounds heard. Central nervous system: Alert and oriented. No focal neurological deficits. Extremities: No cyanosis or clubbing Skin: No petechiae. Psychiatry: Judgement and insight appear normal.  Depressed mood, flat affect.    Data Reviewed: I have personally reviewed following labs and imaging studies  CBC: Recent Labs  Lab 02/23/20 1537 02/24/20 0553  WBC 18.4* 11.1*  HGB 13.2 10.0*  HCT 39.8 30.9*  MCV 78.5* 80.1  PLT 384 035    Basic Metabolic Panel: Recent Labs  Lab 02/23/20 1537 02/24/20 0553  NA 129* 131*  K 5.3* 4.5  CL 94* 99  CO2 25 24  GLUCOSE 121* 95  BUN 30* 24*  CREATININE 2.00* 1.66*  CALCIUM 9.9 8.4*  MG  --  0.7*    GFR: Estimated Creatinine Clearance: 31.8 mL/min (A) (by C-G formula based on SCr of 1.66 mg/dL (H)).  Liver Function Tests: Recent Labs  Lab 02/23/20 1537 02/24/20 0553  AST 40 26  ALT 36 26  ALKPHOS 64 49  BILITOT 1.4* 0.6  PROT 8.3* 6.1*  ALBUMIN 4.6 3.4*    CBG: No results for input(s): GLUCAP in the last 168 hours.   Recent Results (from the past 240 hour(s))  SARS Coronavirus 2  by RT PCR (hospital order, performed in [facility] hospital lab) Nasopharyngeal Nasopharyngeal Swab     Status: None   Collection Time: 02/23/20  3:37 PM   Specimen: Nasopharyngeal Swab  Result Value Ref Range Status   SARS Coronavirus 2 NEGATIVE NEGATIVE Final    Comment: (NOTE) SARS-CoV-2 target nucleic acids are NOT DETECTED.  The SARS-CoV-2 RNA is generally detectable in upper and lower respiratory specimens during the acute phase of infection. The lowest concentration of SARS-CoV-2 viral copies this assay can detect is 250 copies / mL. A negative result does not preclude SARS-CoV-2 infection and should not be used as the sole basis for treatment or other patient management decisions.  A negative result may occur with improper specimen collection / handling, submission of specimen other than nasopharyngeal swab, presence of viral mutation(s) within the areas targeted by this assay, and inadequate number of viral copies (<250 copies / mL).  A negative result must be combined with clinical observations, patient history, and epidemiological information.  Fact Sheet for Patients:   StrictlyIdeas.no  Fact Sheet for Healthcare Providers: BankingDealers.co.za  This test is not yet approved or  cleared by the [place] FDA and has been authorized for detection and/or diagnosis of SARS-CoV-2 by FDA under an Emergency Use Authorization (EUA).  This EUA will remain in effect (meaning this test can be used) for the duration of the COVID-19 declaration under Section 564(b)(1) of the Act, 21 U.S.C. section 360bbb-3(b)(1), unless the authorization is terminated or revoked sooner.  Performed at [facility], [address], [place], [place] [address]   Blood culture (routine x 2)     Status: None (Preliminary result)   Collection Time: 02/23/20  5:31 PM   Specimen: BLOOD RIGHT HAND  Result Value Ref Range Status   Specimen Description    Final    BLOOD RIGHT HAND BOTTLES DRAWN AEROBIC AND ANAEROBIC   Special Requests Blood Culture adequate volume  Final   Culture   Final    NO GROWTH < 12 HOURS Performed at [facility], [address], [place], [place] [address]    Report Status PENDING  Incomplete  Blood culture (routine x 2)     Status: None (Preliminary result)   Collection Time: 02/23/20  5:37 PM   Specimen: Right Antecubital; Blood  Result Value Ref Range Status   Specimen Description   Final    RIGHT ANTECUBITAL BOTTLES DRAWN AEROBIC AND ANAEROBIC   Special Requests Blood Culture adequate volume  Final   Culture   Final    NO GROWTH < 12 HOURS Performed at [facility], [address], [place], [place] [address]    Report Status PENDING  Incomplete    Radiology Studies: CT Abdomen Pelvis Wo Contrast  Result Date: 02/23/2020 CLINICAL DATA:  Generalized weakness x3 weeks, right lower quadrant pain x1 week. Positive for nausea vomiting and diarrhea 1 week ago. History of hypertension, lung cancer and appendectomy. EXAM: CT ABDOMEN AND PELVIS WITHOUT CONTRAST TECHNIQUE: Multidetector CT imaging of the abdomen and pelvis was performed following the standard protocol without IV contrast. COMPARISON:  PET-CT December 09, 2019 and CT abdomen and pelvis March 28, 2018. FINDINGS: Lower chest: No acute abnormality. Suspected fiducial marker in the left lung base. Hepatobiliary: Unremarkable noncontrast appearance of the hepatic parenchyma. Gallbladder is is distended without associated inflammatory findings. No biliary ductal dilatation. Pancreas: Unremarkable. No pancreatic ductal dilatation or surrounding inflammatory changes. Spleen: Normal in size without focal abnormality. Adrenals/Urinary Tract: Adrenal glands are unremarkable. Kidneys are normal, without renal calculi, focal lesion, or hydronephrosis. Bladder is unremarkable. Stomach/Bowel: Stomach is within normal limits. Duodenal diverticulum. Appendix is surgically absent  by report. No evidence of bowel wall thickening, distention, or inflammatory changes. Vascular/Lymphatic: Aortic atherosclerosis. No enlarged abdominal or pelvic lymph nodes. Reproductive: Uterus and bilateral adnexa are unremarkable. Other: No abdominopelvic ascites. Musculoskeletal: No acute or significant osseous findings. Multilevel degenerative changes spine. IMPRESSION: 1. No acute abnormality in the abdomen or pelvis. 2. Gallbladder is distended without associated inflammatory findings, commonly seen in fasting state. 3. Aortic atherosclerosis. Aortic Atherosclerosis (ICD10-I70.0). Electronically Signed   By: Dahlia Bailiff MD   On: 02/23/2020 20:09   Portable chest 1 View  Result Date: 02/24/2020 CLINICAL DATA:  Lymphocytosis with generalized weakness. EXAM: PORTABLE CHEST 1 VIEW COMPARISON:  To 6 point FINDINGS: Stable volume loss left hemithorax with leftward shift of cardiomediastinal anatomy. Right lung is hyperexpanded but clear.  Similar appearance of chronic airspace disease/scarring in the left apex. Fiducial markers with retrocardiac pulmonary nodule again noted left base. The visualized bony structures of the thorax show no acute abnormality. Telemetry leads overlie the chest. IMPRESSION: Stable exam. No acute cardiopulmonary findings. Volume loss left hemithorax with chronic airspace disease/scarring in the left apex and fiducial markers with nodular density noted in the retrocardiac left base. Electronically Signed   By: Misty Stanley M.D.   On: 02/24/2020 05:42   DG Chest Port 1 View  Result Date: 02/23/2020 CLINICAL DATA:  Generalized weakness. EXAM: PORTABLE CHEST 1 VIEW COMPARISON:  December 31, 2019 FINDINGS: Tortuosity of the aorta. Cardiomediastinal silhouette is normal. Mediastinal contours appear intact. Increased attenuation of the left lung apex and nodular opacity in the left lower lobe are stable. No significant airspace consolidation. Osseous structures are without acute  abnormality. Soft tissues are grossly normal. IMPRESSION: 1. Stable increased attenuation of the left lung apex and pulmonary nodule in the left lower lobe. 2. No significant airspace consolidation. Electronically Signed   By: Fidela Salisbury M.D.   On: 02/23/2020 16:17   ECHOCARDIOGRAM COMPLETE  Result Date: 02/24/2020    ECHOCARDIOGRAM REPORT   Patient Name:   LIZETH BENCOSME Date of Exam: 02/24/2020 Medical Rec #:  [id number]        Height:       68.0 in Accession #:    [id number]       Weight:       129.6 lb Date of Birth:  [birth year]        BSA:          1.700 m Patient Age:    48 years         BP:           98/68 mmHg Patient Gender: F                HR:           85 bpm. Exam Location:  [facility] Procedure: 2D Echo, Cardiac Doppler and Color Doppler Indications:    Elevated Troponin  History:        Patient has prior history of Echocardiogram examinations, most                 recent 09/05/2017. Signs/Symptoms:Syncope; Risk Factors:Current                 Smoker, Hypertension and Dyslipidemia. Lung cancer.  Sonographer:    Dustin Flock RDCS Referring Phys: [id number] ASIA B [place]  1. Left ventricular ejection fraction, by estimation, is 50 to 55%. The left ventricle has low normal function. The left ventricle has no regional wall motion abnormalities. There is mild left ventricular hypertrophy. Left ventricular diastolic parameters are consistent with Grade I diastolic dysfunction (impaired relaxation).  2. Right ventricular systolic function is normal. The right ventricular size is normal. There is normal pulmonary artery systolic pressure.  3. The mitral valve is normal in structure. No evidence of mitral valve regurgitation. No evidence of mitral stenosis.  4. The aortic valve is tricuspid. Aortic valve regurgitation is not visualized. No aortic stenosis is present.  5. The inferior vena cava is normal in size with greater than 50% respiratory variability, suggesting right atrial  pressure of 3 mmHg. FINDINGS  Left Ventricle: Left ventricular ejection fraction, by estimation, is 50 to 55%. The left ventricle has low normal function. The left ventricle has no regional wall motion abnormalities. The left ventricular internal cavity  size was normal in size. There is mild left ventricular hypertrophy. Left ventricular diastolic parameters are consistent with Grade I diastolic dysfunction (impaired relaxation). Normal left ventricular filling pressure. Right Ventricle: The right ventricular size is normal. No increase in right ventricular wall thickness. Right ventricular systolic function is normal. There is normal pulmonary artery systolic pressure. The tricuspid regurgitant velocity is 2.85 m/s, and  with an assumed right atrial pressure of 3 mmHg, the estimated right ventricular systolic pressure is 65.9 mmHg. Left Atrium: Left atrial size was normal in size. Right Atrium: Right atrial size was normal in size. Pericardium: There is no evidence of pericardial effusion. Mitral Valve: The mitral valve is normal in structure. No evidence of mitral valve regurgitation. No evidence of mitral valve stenosis. Tricuspid Valve: The tricuspid valve is normal in structure. Tricuspid valve regurgitation is not demonstrated. No evidence of tricuspid stenosis. Aortic Valve: The aortic valve is tricuspid. Aortic valve regurgitation is not visualized. No aortic stenosis is present. Aortic valve mean gradient measures 3.6 mmHg. Aortic valve peak gradient measures 6.2 mmHg. Aortic valve area, by VTI measures 2.87 cm. Pulmonic Valve: The pulmonic valve was not well visualized. Pulmonic valve regurgitation is not visualized. No evidence of pulmonic stenosis. Aorta: The aortic root is normal in size and structure. Pulmonary Artery: Indeterminant PASP, inadequate TR jet. Venous: The inferior vena cava is normal in size with greater than 50% respiratory variability, suggesting right atrial pressure of 3 mmHg.  IAS/Shunts: No atrial level shunt detected by color flow Doppler.  LEFT VENTRICLE PLAX 2D LVIDd:         4.02 cm  Diastology LVIDs:         2.47 cm  LV e' medial:    5.55 cm/s LV PW:         1.18 cm  LV E/e' medial:  7.3 LV IVS:        1.13 cm  LV e' lateral:   7.72 cm/s LVOT diam:     2.20 cm  LV E/e' lateral: 5.3 LV SV:         58 LV SV Index:   34 LVOT Area:     3.80 cm  RIGHT VENTRICLE RV Basal diam:  2.59 cm RV S prime:     10.90 cm/s TAPSE (M-mode): 2.1 cm LEFT ATRIUM             Index       RIGHT ATRIUM          Index LA diam:        2.60 cm 1.53 cm/m  RA Area:     8.78 cm LA Vol (A2C):   24.6 ml 14.47 ml/m RA Volume:   18.00 ml 10.59 ml/m LA Vol (A4C):   17.5 ml 10.30 ml/m LA Biplane Vol: 21.8 ml 12.83 ml/m  AORTIC VALVE AV Area (Vmax):    2.70 cm AV Area (Vmean):   2.46 cm AV Area (VTI):     2.87 cm AV Vmax:           124.88 cm/s AV Vmean:          89.491 cm/s AV VTI:            0.202 m AV Peak Grad:      6.2 mmHg AV Mean Grad:      3.6 mmHg LVOT Vmax:         88.70 cm/s LVOT Vmean:        58.000 cm/s LVOT VTI:  0.152 m LVOT/AV VTI ratio: 0.75  AORTA Ao Root diam: 2.70 cm MITRAL VALVE               TRICUSPID VALVE MV Area (PHT): 9.60 cm    TR Peak grad:   32.5 mmHg MV Decel Time: 79 msec     TR Vmax:        285.00 cm/s MV E velocity: 40.70 cm/s MV A velocity: 68.10 cm/s  SHUNTS MV E/A ratio:  0.60        Systemic VTI:  0.15 m                            Systemic Diam: 2.20 cm Carlyle Dolly MD Electronically signed by Carlyle Dolly MD Signature Date/Time: 02/24/2020/12:46:20 PM    Final     Scheduled Meds: . amLODipine  2.5 mg Oral Daily  . atorvastatin  10 mg Oral q1800  . DULoxetine  60 mg Oral Daily  . heparin  5,000 Units Subcutaneous Q8H  . levothyroxine  75 mcg Oral QODAY   And  . [START ON 02/25/2020] levothyroxine  37.5 mcg Oral QODAY  . magnesium oxide  400 mg Oral Daily  . mirtazapine  7.5 mg Oral QHS  . montelukast  10 mg Oral QHS   Continuous Infusions: . sodium  chloride 75 mL/hr at 02/24/20 1429     LOS: 0 days    Time spent: 30 minutes.  Barton Dubois, MD [facility]   To contact the attending provider between 7A-7P or the covering provider during after hours 7P-7A, please log into the web site [email or web address] and access using universal  password for that web site. If you do not have the password, please call the hospital operator.  02/24/2020, 3:22 PM

## 2020-02-24 NOTE — Plan of Care (Signed)
  Problem: Acute Rehab PT Goals(only PT should resolve) Goal: Patient Will Transfer Sit To/From Stand Outcome: Progressing Flowsheets (Taken 02/24/2020 1338) Patient will transfer sit to/from stand: with supervision Goal: Pt Will Transfer Bed To Chair/Chair To Bed Outcome: Progressing Flowsheets (Taken 02/24/2020 1338) Pt will Transfer Bed to Chair/Chair to Bed: with supervision Goal: Pt Will Ambulate Outcome: Progressing Flowsheets (Taken 02/24/2020 1338) Pt will Ambulate:  > 125 feet  with supervision  with least restrictive assistive device Goal: Pt Will Go Up/Down Stairs Outcome: Progressing Flowsheets (Taken 02/24/2020 1338) Pt will Go Up / Down Stairs:  1-2 stairs  with min guard assist  with rail(s) Goal: Pt/caregiver will Perform Home Exercise Program Outcome: Progressing Flowsheets (Taken 02/24/2020 1338) Pt/caregiver will Perform Home Exercise Program:  For increased strengthening  For improved balance  With minimal assist   Pamala Hurry D. Hartnett-Rands, MS, PT Per [place] [phone] 02/24/2020

## 2020-02-25 DIAGNOSIS — R531 Weakness: Secondary | ICD-10-CM

## 2020-02-25 DIAGNOSIS — R778 Other specified abnormalities of plasma proteins: Secondary | ICD-10-CM | POA: Diagnosis not present

## 2020-02-25 DIAGNOSIS — E038 Other specified hypothyroidism: Secondary | ICD-10-CM | POA: Diagnosis not present

## 2020-02-25 DIAGNOSIS — D72829 Elevated white blood cell count, unspecified: Secondary | ICD-10-CM

## 2020-02-25 DIAGNOSIS — I10 Essential (primary) hypertension: Secondary | ICD-10-CM | POA: Diagnosis not present

## 2020-02-25 LAB — BASIC METABOLIC PANEL
Anion gap: 6 (ref 5–15)
BUN: 13 mg/dL (ref 8–23)
CO2: 25 mmol/L (ref 22–32)
Calcium: 8.7 mg/dL — ABNORMAL LOW (ref 8.9–10.3)
Chloride: 106 mmol/L (ref 98–111)
Creatinine, Ser: 1.07 mg/dL — ABNORMAL HIGH (ref 0.44–1.00)
GFR, Estimated: 58 mL/min — ABNORMAL LOW (ref >60)
Glucose, Bld: 85 mg/dL (ref 70–99)
Potassium: 4.8 mmol/L (ref 3.5–5.1)
Sodium: 137 mmol/L (ref 135–145)

## 2020-02-25 LAB — MAGNESIUM: Magnesium: 1.8 mg/dL (ref 1.7–2.4)

## 2020-02-25 MED ORDER — MIRTAZAPINE 7.5 MG PO TABS: 7.5000 mg | ORAL_TABLET | Freq: Every day | ORAL | 1 refills | Status: DC

## 2020-02-25 NOTE — Discharge Summary (Signed)
Physician Discharge Summary  Kelly Douglas NTZ:[id number] DOB: 05-29-1955 DOA: 02/23/2020  PCP: [name], MD  Admit date: 02/23/2020 Discharge date: 02/25/2020  Time spent: 35 minutes  Recommendations for Outpatient Follow-up:  1. Follow response and stability in patient's mood status with further adjustment to antidepressant regimen as required. 2. Repeat basic metabolic panel to follow electrolytes and renal function 3. Reassess blood pressure and restart antihypertensive agent as needed 4. Continue assisting patient with a smoking cessation.   Discharge Diagnoses:  Active Problems:   Hypothyroidism   HTN (hypertension)   AKI (acute kidney injury) ([place])   Acute renal failure (ARF) ([facility])   Elevated troponin   Generalized weakness   Leukocytosis   Discharge Condition: Stable and improved.  Patient discharged home with instruction to follow-up with PCP in 10 days.  CODE STATUS: Full code.  Diet recommendation: Heart healthy diet.  Filed Weights   02/23/20 1530 02/24/20 0008  Weight: 63.5 kg 58.8 kg    History of present illness:  As per H&P written by Dr. Clearence Ped on  02/23/20 BettyAlversonis a65 y.o.female,with history of tobacco abuse, syncope, scleroderma, hypothyroid, HTN, HLD, GERD, and depression/anxiety, presents to the ED with a chief complaint of weakness. Patient reports that it started 3 weeks ago, when her brother died. She reports that at the beginning of the three weeks, she had nausea and vomiting. The nausea was worse every time she tried to eat. She has abdominal pain that is a separate issue and has been present for 42 years. It is no different than her normal abdominal pain. She reports that she is no longer having nausea and vomiting, but she still has no appetite. She has not had a normal meal since December, per her report. She reports that she came in today because she has been feeling weak today, and she fell down 2/2 weakness. She was not  able to stand up without support after the fall. She is not on blood thinners, and she has not hit her head. The weakness feels like it did when she had hypokalemia in the past. Patient reports that she has had decreased urine output and darker urine. No hematuria. Patient reports that she attributed the decrease in urine output to decrease in PO intake. Patient reports that she has felt feverish, but has not had any measured fevers. Patient admits to her normal smoker's cough. She has no other complaints at this time.   Hospital Course:  1-acute renal failure -In the setting of prerenal azotemia, dehydration and hypovolemia. -Resolved with fluid resuscitation and avoidance of nephrotoxic agents. -Patient advised to maintain adequate hydration. -Repeat basic metabolic panel follow-up visit to assess renal function and stability. -Continue to minimize nephrotoxic agents and avoid hypotension. -At discharge creatinine 1.07.  2-essential hypertension -Will hold amlodipine at this moment; blood pressure soft and no requiring antihypertensive agents currently -Reassess blood pressure at follow-up visit and restart antihypertensive medications as needed -Patient advised to follow heart healthy diet.  3-hyponatremia/dehydration -Improved/resolved after fluid resuscitation -Patient advised to keep her self well-hydrated -Repeat basic metabolic panel follow-up visit to reassess electrolytes trend and instability.  4-elevated troponin--no chest pain, no shortness of breath -Probably associated with ongoing dehydration and acute renal failure. -No wall motion normalities appreciated -Grade 1 diastolic dysfunction seen with mild hypertrophy. -Continue heart healthy diet and maintain adequate hydration. -Reassessed blood pressure as an outpatient and adjust medication as needed.  5-hypothyroidism -TSH within normal limits -Continue Synthroid.  6-hyperlipidemia -Continue  statin.  7-physical deconditioning -Patient has been seen by physical therapy with recommendation for home health services -Home health services has been recommended and arranged at discharge. -Rolling walker As DME purposes provided.  8-tobacco abuse -cessation counseling provided -Patient declines nicotine patch at discharge.  9-hx of Lung cancer -continue outpatient follow up with pulmonologist  10-depression -no SI or hallucinations -will continue cymbalta and add low dose remeron. -Follow clinical response as an outpatient and adjust medication as needed.  11-hypomagnesemia -Repleted and within normal limits at discharge.  Procedures: See below for x-ray reports. 2-D Echo: 1. Left ventricular ejection fraction, by estimation, is 50 to 55%. The  left ventricle has low normal function. The left ventricle has no regional  wall motion abnormalities. There is mild left ventricular hypertrophy.  Left ventricular diastolic  parameters are consistent with Grade I diastolic dysfunction (impaired  relaxation).  2. Right ventricular systolic function is normal. The right ventricular  size is normal. There is normal pulmonary artery systolic pressure.  3. The mitral valve is normal in structure. No evidence of mitral valve  regurgitation. No evidence of mitral stenosis.  4. The aortic valve is tricuspid. Aortic valve regurgitation is not  visualized. No aortic stenosis is present.  5. The inferior vena cava is normal in size with greater than 50%  respiratory variability, suggesting right atrial pressure of 3 mmHg.   Consultations:  None  Discharge Exam: Vitals:   02/25/20 0959 02/25/20 1107  BP: (!) 118/95   Pulse: (!) 109   Resp:    Temp:    SpO2:  98%    General: Afebrile, no chest pain, no nausea, no vomiting.  Patient reports tolerating diet without difficulties and feeling ready to go home. Cardiovascular: S1 and S2, no rubs, no gallops, no  JVD. Respiratory: Good air movement bilaterally; no requiring oxygen supplementation.  Patient denies shortness of breath. Abdomen: Soft, nontender, nondistended, positive bowel sounds Extremities: No cyanosis or clubbing.  Discharge Instructions   Discharge Instructions    Diet - low sodium heart healthy   Complete by: As directed    Discharge instructions   Complete by: As directed    Maintain adequate hydration Take medication as prescribed Arrange follow-up with PCP in 10 days Follow heart healthy diet   Increase activity slowly   Complete by: As directed      Allergies as of 02/25/2020      Reactions   Aspirin Other (See Comments)   Nose bleeds      Medication List    STOP taking these medications   amLODipine 5 MG tablet Commonly known as: NORVASC   meloxicam 7.5 MG tablet Commonly known as: MOBIC     TAKE these medications   acetaminophen 650 MG CR tablet Commonly known as: TYLENOL Take 1,300 mg by mouth every 8 (eight) hours as needed for pain.   atorvastatin 10 MG tablet Commonly known as: LIPITOR Take 1 tablet (10 mg total) by mouth daily.   DULoxetine 60 MG capsule Commonly known as: CYMBALTA TAKE 1 CAPSULE EVERY DAY   guaiFENesin-dextromethorphan 100-10 MG/5ML syrup Commonly known as: ROBITUSSIN DM Take 5 mLs by mouth every 4 (four) hours as needed for cough.   hydrOXYzine 25 MG tablet Commonly known as: ATARAX/VISTARIL Take 1 tablet (25 mg total) by mouth 3 (three) times daily as needed for anxiety or itching.   levothyroxine 75 MCG tablet Commonly known as: SYNTHROID TAKE 1 TABLET BY MOUTH  EVERY OTHER DAY ALTERNATING WITH 1/2 TABLET EVERY OTHER  DAY What changed: See the new instructions.   Lidocaine-Menthol 4-1 % Ptch Apply 1 patch topically daily as needed (pain).   Magnesium 500 MG Caps Take 1 capsule by mouth daily.   mirtazapine 7.5 MG tablet Commonly known as: REMERON Take 1 tablet (7.5 mg total) by mouth at bedtime.    montelukast 10 MG tablet Commonly known as: SINGULAIR Take 1 tablet (10 mg total) by mouth at bedtime.   multivitamin with minerals Tabs tablet Take 1 tablet by mouth daily.   oxyCODONE 5 MG immediate release tablet Commonly known as: Oxy IR/ROXICODONE Take 1 tablet (5 mg total) by mouth every 4 (four) hours as needed for moderate pain.   pantoprazole 40 MG tablet Commonly known as: PROTONIX TAKE 1 TABLET TWICE DAILY   potassium chloride 10 MEQ tablet Commonly known as: Klor-Con 10 Take 1 tablet (10 mEq total) by mouth daily.   ProAir RespiClick 993 (90 Base) MCG/ACT Aepb Generic drug: Albuterol Sulfate Inhale 2 puffs into the lungs 4 (four) times daily as needed.   Vitamin D 125 MCG (5000 UT) Caps Take 5,000 Units by mouth daily.            Durable Medical Equipment  (From admission, onward)         Start     Ordered   02/25/20 1407  For home use only DME Walker rolling  Once       Question Answer Comment  Walker: With 5 Inch Wheels   Patient needs a walker to treat with the following condition Physical deconditioning      02/25/20 1406         Allergies  Allergen Reactions  . Aspirin Other (See Comments)    Nose bleeds    Follow-up Information    [facility] Follow up.   Specialty: Home Health Services Why: HHPT Contact information: [place] [address] [phone]        [name], MD. Schedule an appointment as soon as possible for a visit in 10 day(s).   Specialty: Family Medicine Contact information: [address], [place] [address] [phone]               The results of significant diagnostics from this hospitalization (including imaging, microbiology, ancillary and laboratory) are listed below for reference.    Significant Diagnostic Studies: CT Abdomen Pelvis Wo Contrast  Result Date: 02/23/2020 CLINICAL DATA:  Generalized weakness x3 weeks, right lower  quadrant pain x1 week. Positive for nausea vomiting and diarrhea 1 week ago. History of hypertension, lung cancer and appendectomy. EXAM: CT ABDOMEN AND PELVIS WITHOUT CONTRAST TECHNIQUE: Multidetector CT imaging of the abdomen and pelvis was performed following the standard protocol without IV contrast. COMPARISON:  PET-CT December 09, 2019 and CT abdomen and pelvis March 28, 2018. FINDINGS: Lower chest: No acute abnormality. Suspected fiducial marker in the left lung base. Hepatobiliary: Unremarkable noncontrast appearance of the hepatic parenchyma. Gallbladder is is distended without associated inflammatory findings. No biliary ductal dilatation. Pancreas: Unremarkable. No pancreatic ductal dilatation or surrounding inflammatory changes. Spleen: Normal in size without focal abnormality. Adrenals/Urinary Tract: Adrenal glands are unremarkable. Kidneys are normal, without renal calculi, focal lesion, or hydronephrosis. Bladder is unremarkable. Stomach/Bowel: Stomach is within normal limits. Duodenal diverticulum. Appendix is surgically absent by report. No evidence of bowel wall thickening, distention, or inflammatory changes. Vascular/Lymphatic: Aortic atherosclerosis. No enlarged abdominal or pelvic lymph nodes. Reproductive: Uterus and bilateral adnexa are unremarkable.  Other: No abdominopelvic ascites. Musculoskeletal: No acute or significant osseous findings. Multilevel degenerative changes spine. IMPRESSION: 1. No acute abnormality in the abdomen or pelvis. 2. Gallbladder is distended without associated inflammatory findings, commonly seen in fasting state. 3. Aortic atherosclerosis. Aortic Atherosclerosis (ICD10-I70.0). Electronically Signed   By: Dahlia Bailiff MD   On: 02/23/2020 20:09   Portable chest 1 View  Result Date: 02/24/2020 CLINICAL DATA:  Lymphocytosis with generalized weakness. EXAM: PORTABLE CHEST 1 VIEW COMPARISON:  To 6 point FINDINGS: Stable volume loss left hemithorax with leftward  shift of cardiomediastinal anatomy. Right lung is hyperexpanded but clear. Similar appearance of chronic airspace disease/scarring in the left apex. Fiducial markers with retrocardiac pulmonary nodule again noted left base. The visualized bony structures of the thorax show no acute abnormality. Telemetry leads overlie the chest. IMPRESSION: Stable exam. No acute cardiopulmonary findings. Volume loss left hemithorax with chronic airspace disease/scarring in the left apex and fiducial markers with nodular density noted in the retrocardiac left base. Electronically Signed   By: Misty Stanley M.D.   On: 02/24/2020 05:42   DG Chest Port 1 View  Result Date: 02/23/2020 CLINICAL DATA:  Generalized weakness. EXAM: PORTABLE CHEST 1 VIEW COMPARISON:  December 31, 2019 FINDINGS: Tortuosity of the aorta. Cardiomediastinal silhouette is normal. Mediastinal contours appear intact. Increased attenuation of the left lung apex and nodular opacity in the left lower lobe are stable. No significant airspace consolidation. Osseous structures are without acute abnormality. Soft tissues are grossly normal. IMPRESSION: 1. Stable increased attenuation of the left lung apex and pulmonary nodule in the left lower lobe. 2. No significant airspace consolidation. Electronically Signed   By: Fidela Salisbury M.D.   On: 02/23/2020 16:17   ECHOCARDIOGRAM COMPLETE  Result Date: 02/24/2020    ECHOCARDIOGRAM REPORT   Patient Name:   NEEMA BARREIRA Date of Exam: 02/24/2020 Medical Rec #:  [id number]        Height:       68.0 in Accession #:    [id number]       Weight:       129.6 lb Date of Birth:  July 13, 1955        BSA:          1.700 m Patient Age:    32 years         BP:           98/68 mmHg Patient Gender: F                HR:           85 bpm. Exam Location:  [facility] Procedure: 2D Echo, Cardiac Doppler and Color Doppler Indications:    Elevated Troponin  History:        Patient has prior history of Echocardiogram examinations, most                  recent 09/05/2017. Signs/Symptoms:Syncope; Risk Factors:Current                 Smoker, Hypertension and Dyslipidemia. Lung cancer.  Sonographer:    Dustin Flock RDCS Referring Phys: [id number] ASIA B [place]  1. Left ventricular ejection fraction, by estimation, is 50 to 55%. The left ventricle has low normal function. The left ventricle has no regional wall motion abnormalities. There is mild left ventricular hypertrophy. Left ventricular diastolic parameters are consistent with Grade I diastolic dysfunction (impaired relaxation).  2. Right ventricular systolic function is normal. The right ventricular size is normal.  There is normal pulmonary artery systolic pressure.  3. The mitral valve is normal in structure. No evidence of mitral valve regurgitation. No evidence of mitral stenosis.  4. The aortic valve is tricuspid. Aortic valve regurgitation is not visualized. No aortic stenosis is present.  5. The inferior vena cava is normal in size with greater than 50% respiratory variability, suggesting right atrial pressure of 3 mmHg. FINDINGS  Left Ventricle: Left ventricular ejection fraction, by estimation, is 50 to 55%. The left ventricle has low normal function. The left ventricle has no regional wall motion abnormalities. The left ventricular internal cavity size was normal in size. There is mild left ventricular hypertrophy. Left ventricular diastolic parameters are consistent with Grade I diastolic dysfunction (impaired relaxation). Normal left ventricular filling pressure. Right Ventricle: The right ventricular size is normal. No increase in right ventricular wall thickness. Right ventricular systolic function is normal. There is normal pulmonary artery systolic pressure. The tricuspid regurgitant velocity is 2.85 m/s, and  with an assumed right atrial pressure of 3 mmHg, the estimated right ventricular systolic pressure is 16.1 mmHg. Left Atrium: Left atrial size was normal in  size. Right Atrium: Right atrial size was normal in size. Pericardium: There is no evidence of pericardial effusion. Mitral Valve: The mitral valve is normal in structure. No evidence of mitral valve regurgitation. No evidence of mitral valve stenosis. Tricuspid Valve: The tricuspid valve is normal in structure. Tricuspid valve regurgitation is not demonstrated. No evidence of tricuspid stenosis. Aortic Valve: The aortic valve is tricuspid. Aortic valve regurgitation is not visualized. No aortic stenosis is present. Aortic valve mean gradient measures 3.6 mmHg. Aortic valve peak gradient measures 6.2 mmHg. Aortic valve area, by VTI measures 2.87 cm. Pulmonic Valve: The pulmonic valve was not well visualized. Pulmonic valve regurgitation is not visualized. No evidence of pulmonic stenosis. Aorta: The aortic root is normal in size and structure. Pulmonary Artery: Indeterminant PASP, inadequate TR jet. Venous: The inferior vena cava is normal in size with greater than 50% respiratory variability, suggesting right atrial pressure of 3 mmHg. IAS/Shunts: No atrial level shunt detected by color flow Doppler.  LEFT VENTRICLE PLAX 2D LVIDd:         4.02 cm  Diastology LVIDs:         2.47 cm  LV e' medial:    5.55 cm/s LV PW:         1.18 cm  LV E/e' medial:  7.3 LV IVS:        1.13 cm  LV e' lateral:   7.72 cm/s LVOT diam:     2.20 cm  LV E/e' lateral: 5.3 LV SV:         58 LV SV Index:   34 LVOT Area:     3.80 cm  RIGHT VENTRICLE RV Basal diam:  2.59 cm RV S prime:     10.90 cm/s TAPSE (M-mode): 2.1 cm LEFT ATRIUM             Index       RIGHT ATRIUM          Index LA diam:        2.60 cm 1.53 cm/m  RA Area:     8.78 cm LA Vol (A2C):   24.6 ml 14.47 ml/m RA Volume:   18.00 ml 10.59 ml/m LA Vol (A4C):   17.5 ml 10.30 ml/m LA Biplane Vol: 21.8 ml 12.83 ml/m  AORTIC VALVE AV Area (Vmax):    2.70 cm AV Area (  Vmean):   2.46 cm AV Area (VTI):     2.87 cm AV Vmax:           124.88 cm/s AV Vmean:          89.491 cm/s AV  VTI:            0.202 m AV Peak Grad:      6.2 mmHg AV Mean Grad:      3.6 mmHg LVOT Vmax:         88.70 cm/s LVOT Vmean:        58.000 cm/s LVOT VTI:          0.152 m LVOT/AV VTI ratio: 0.75  AORTA Ao Root diam: 2.70 cm MITRAL VALVE               TRICUSPID VALVE MV Area (PHT): 9.60 cm    TR Peak grad:   32.5 mmHg MV Decel Time: 79 msec     TR Vmax:        285.00 cm/s MV E velocity: 40.70 cm/s MV A velocity: 68.10 cm/s  SHUNTS MV E/A ratio:  0.60        Systemic VTI:  0.15 m                            Systemic Diam: 2.20 cm Carlyle Dolly MD Electronically signed by Carlyle Dolly MD Signature Date/Time: 02/24/2020/12:46:20 PM    Final     Microbiology: Recent Results (from the past 240 hour(s))  SARS Coronavirus 2 by RT PCR (hospital order, performed in [place] hospital lab) Nasopharyngeal Nasopharyngeal Swab     Status: None   Collection Time: 02/23/20  3:37 PM   Specimen: Nasopharyngeal Swab  Result Value Ref Range Status   SARS Coronavirus 2 NEGATIVE NEGATIVE Final    Comment: (NOTE) SARS-CoV-2 target nucleic acids are NOT DETECTED.  The SARS-CoV-2 RNA is generally detectable in upper and lower respiratory specimens during the acute phase of infection. The lowest concentration of SARS-CoV-2 viral copies this assay can detect is 250 copies / mL. A negative result does not preclude SARS-CoV-2 infection and should not be used as the sole basis for treatment or other patient management decisions.  A negative result may occur with improper specimen collection / handling, submission of specimen other than nasopharyngeal swab, presence of viral mutation(s) within the areas targeted by this assay, and inadequate number of viral copies (<250 copies / mL). A negative result must be combined with clinical observations, patient history, and epidemiological information.  Fact Sheet for Patients:   StrictlyIdeas.no  Fact Sheet for Healthcare  Providers: BankingDealers.co.za  This test is not yet approved or  cleared by the [place] FDA and has been authorized for detection and/or diagnosis of SARS-CoV-2 by FDA under an Emergency Use Authorization (EUA).  This EUA will remain in effect (meaning this test can be used) for the duration of the COVID-19 declaration under Section 564(b)(1) of the Act, 21 U.S.C. section 360bbb-3(b)(1), unless the authorization is terminated or revoked sooner.  Performed at [facility], [address] [place], [place] [address]   Blood culture (routine x 2)     Status: None (Preliminary result)   Collection Time: 02/23/20  5:31 PM   Specimen: BLOOD RIGHT HAND  Result Value Ref Range Status   Specimen Description   Final    BLOOD RIGHT HAND BOTTLES DRAWN AEROBIC AND ANAEROBIC   Special Requests Blood Culture  adequate volume  Final   Culture   Final    NO GROWTH 2 DAYS Performed at [facility], [address], [place], [place] [address]    Report Status PENDING  Incomplete  Blood culture (routine x 2)     Status: None (Preliminary result)   Collection Time: 02/23/20  5:37 PM   Specimen: Right Antecubital; Blood  Result Value Ref Range Status   Specimen Description   Final    RIGHT ANTECUBITAL BOTTLES DRAWN AEROBIC AND ANAEROBIC   Special Requests Blood Culture adequate volume  Final   Culture   Final    NO GROWTH 2 DAYS Performed at [facility], [address], [place], [place] [address]    Report Status PENDING  Incomplete     Labs: Basic Metabolic Panel: Recent Labs  Lab 02/23/20 1537 02/24/20 0553 02/25/20 0550  NA 129* 131* 137  K 5.3* 4.5 4.8  CL 94* 99 106  CO2 25 24 25   GLUCOSE 121* 95 85  BUN 30* 24* 13  CREATININE 2.00* 1.66* 1.07*  CALCIUM 9.9 8.4* 8.7*  MG  --  0.7* 1.8   Liver Function Tests: Recent Labs  Lab 02/23/20 1537 02/24/20 0553  AST 40 26  ALT 36 26  ALKPHOS 64 49  BILITOT 1.4* 0.6  PROT 8.3* 6.1*  ALBUMIN 4.6 3.4*    Recent Labs  Lab 02/23/20 1537  LIPASE 80*   CBC: Recent Labs  Lab 02/23/20 1537 02/24/20 0553  WBC 18.4* 11.1*  HGB 13.2 10.0*  HCT 39.8 30.9*  MCV 78.5* 80.1  PLT 384 327   Signed:  Barton Dubois MD.  [facility] 02/25/2020, 2:06 PM

## 2020-02-25 NOTE — Progress Notes (Signed)
Physical Therapy Treatment Patient Details Name: Kelly Douglas MRN: [id number] DOB: 20-Aug-1955 Today's Date: 02/25/2020    History of Present Illness Kelly Douglas  is a 65 y.o. female, with history of tobacco abuse, syncope, scleroderma, hypothyroid, HTN, HLD, GERD, and depression/anxiety, presents to the ED with a chief complaint of weakness. Patient reports that it started 3 weeks ago, when her brother died. She reports that at the beginning of the three weeks, she had nausea and vomiting. The nausea was worse every time she tried to eat. She has abdominal pain that is a separate issue and has been present for 42 years. It is no different than her normal abdominal pain. She reports that she is no longer having nausea and vomiting, but she still has no appetite. She has not had a normal meal since December, per her report. She reports that she came in today because she has been feeling weak today, and she fell down 2/2 weakness. She was not able to stand up without support after the fall. She is not on blood thinners, and she has not hit her head. The weakness feels like it did when she had hypokalemia in the past. Patient reports that she has had decreased urine output and darker urine. No hematuria. Patient reports that she attributed the decrease in urine output to decrease in PO intake. Patient reports that she has felt feverish, but has not had any measured fevers. Patient admits to her normal smoker's cough. She has no other complaints at this time.      Of Note - patient is very focused on the loss of several family members. Despite attempts to deflect, she has recounted the deaths of each of her siblings, and the last brother just died a few weeks ago. She reports that he had Down's and she spent each day taking care of him. It is quite possible that her lack in appetite and GI symptoms are 2/2 depression.    PT Comments    Pt friendly and willing to participate with therapy today.  Pt  independent with bed mobility, just slow movements.  Pt unsteady upon initial standing and tendency to reach for objects while ambulating from bed to restroom for bowel movement.  Utilized RW for safety during gait training in hallway, no LOB episodes.  Does present with slow cadence and tendency to sway Rt to Lt down hallway.  EOS pt left in chair and completed LE strengthening exercises.  Pt did c/o mild SOB upon return to room, O2 saturation at 98%.  RW aware of status.      Follow Up Recommendations  Home health PT;Outpatient PT;Supervision - Intermittent;Supervision for mobility/OOB     Equipment Recommendations  Rolling walker with 5" wheels    Recommendations for Other Services       Precautions / Restrictions Precautions Precautions: Fall Precaution Comments: patient reports 8 falls in last 6 months; with 5 in the last week.    Mobility  Bed Mobility Overal bed mobility: Modified Independent                Transfers Overall transfer level: Modified independent Equipment used: Rolling walker (2 wheeled) Transfers: Sit to/from Stand Sit to Stand: Min guard         General transfer comment: Heavy use of UE for power up; unsteady upon initial standing, tendency to reach for objects for stability  Ambulation/Gait Ambulation/Gait assistance: Min guard;Min assist Gait Distance (Feet): 150 Feet Assistive device: Rolling walker (2 wheeled) Gait  Pattern/deviations: Step-through pattern;Decreased stride length Gait velocity: decreased   General Gait Details: unsteady upon initial standing, reached for objects while ambulating to restroom.  Used RW during gait training with no LOB.  Slow cadence with tendency to sway from Rt to Lt.  Upon return to room pt reports SOB and fatigue, room air with O2 saturation at 98%.   Stairs             Wheelchair Mobility    Modified Rankin (Stroke Patients Only)       Balance                                             Cognition Arousal/Alertness: Awake/alert Behavior During Therapy: WFL for tasks assessed/performed Overall Cognitive Status: Within Functional Limits for tasks assessed                                        Exercises General Exercises - Lower Extremity Long Arc Quad: Both;10 reps;Seated Hip Flexion/Marching: 10 reps;Both;Seated Toe Raises: 15 reps;Seated;Both Heel Raises: 15 reps;Seated;Both    General Comments        Pertinent Vitals/Pain Pain Assessment: No/denies pain    Home Living                      Prior Function            PT Goals (current goals can now be found in the care plan section)      Frequency           PT Plan      Co-evaluation              AM-PAC PT "6 Clicks" Mobility   Outcome Measure  Help needed turning from your back to your side while in a flat bed without using bedrails?: A Little Help needed moving from lying on your back to sitting on the side of a flat bed without using bedrails?: A Little Help needed moving to and from a bed to a chair (including a wheelchair)?: A Little Help needed standing up from a chair using your arms (e.g., wheelchair or bedside chair)?: A Little Help needed to walk in hospital room?: A Little Help needed climbing 3-5 steps with a railing? : A Lot 6 Click Score: 17    End of Session Equipment Utilized During Treatment: Gait belt Activity Tolerance: Patient limited by fatigue Patient left: in chair;with call bell/phone within reach;with chair alarm set Nurse Communication: Mobility status PT Visit Diagnosis: Unsteadiness on feet (R26.81);Other abnormalities of gait and mobility (R26.89);History of falling (Z91.81);Muscle weakness (generalized) (M62.81)     Time: 1540-0867 PT Time Calculation (min) (ACUTE ONLY): 27 min  Charges:  $Therapeutic Exercise: 8-22 mins $Therapeutic Activity: 8-22 mins                     Ihor Austin, LPTA/CLT;  CBIS [phone]   Kelly Douglas 02/25/2020, 11:09 AM

## 2020-02-25 NOTE — TOC Transition Note (Signed)
Transition of Care [facility]) - CM/SW Discharge Note   Patient Details  Name: FAIGA STONES MRN: [id number] Date of Birth: 11-07-1955  Transition of Care [facility]) CM/SW Contact:  Ihor Gully, LCSW Phone Number: 02/25/2020, 2:35 PM   Clinical Narrative:    Tommi Rumps with [facility] notified of patient discharging today. Patient's spouse, Mr. Shadduck, indicates that patient has a rolling walker currently.    Final next level of care: [place] Barriers to Discharge: Continued Medical Work up   Patient Goals and CMS Choice Patient states their goals for this hospitalization and ongoing recovery are:: Return home with [facility] CMS Medicare.gov Compare Post Acute Care list provided to:: Patient Choice offered to / list presented to : Patient  Discharge Placement                       Discharge Plan and Services     Post Acute Care Choice: Home Health          DME Arranged: N/A DME Agency: NA       HH Arranged: PT HH Agency: [place] Date [facility] Agency Contacted: 02/24/20 Time [place]: [id number] Representative spoke with at [place]: [place] Determinants of Health ([place]) Interventions     Readmission Risk Interventions No flowsheet data found.

## 2020-02-25 NOTE — Plan of Care (Signed)

## 2020-02-26 ENCOUNTER — Telehealth: Payer: Self-pay | Admitting: *Deleted

## 2020-02-26 NOTE — Telephone Encounter (Signed)
Transition Care Management Follow-up Telephone Call  Date of discharge and from where: 02-25-20  How have you been since you were released from the hospital? Benedict like a little better   Any questions or concerns? No  Items Reviewed:  Did the pt receive and understand the discharge instructions provided? Yes   Medications obtained and verified? Yes   Other? No   Any new allergies since your discharge? Yes   Dietary orders reviewed? No  Do you have support at home? No   Home Care and Equipment/Supplies: Were home health services ordered? no If so, what is the name of the agency? NA  Has the agency set up a time to come to the patient's home? not applicable Were any new equipment or medical supplies ordered?  No What is the name of the medical supply agency? NA Were you able to get the supplies/equipment? not applicable Do you have any questions related to the use of the equipment or supplies? No  Functional Questionnaire: (I = Independent and D = Dependent) ADLs: I  Bathing/Dressing- I  Meal Prep- I  Eating- I  Maintaining continence- I   Transferring/Ambulation- I  Managing Meds- I  Follow up appointments reviewed:   PCP Hospital f/u appt confirmed? Yes  Scheduled to see 03-04-20 on [place] @ 11:00.    Are transportation arrangements needed? No   If their condition worsens, is the pt aware to call PCP or go to the Emergency Dept.? Yes  Was the patient provided with contact information for the PCP's office or ED? Yes  Was to pt encouraged to call back with questions or concerns? Yes

## 2020-02-28 LAB — CULTURE, BLOOD (ROUTINE X 2)
Culture: NO GROWTH
Culture: NO GROWTH
Special Requests: ADEQUATE
Special Requests: ADEQUATE

## 2020-03-04 ENCOUNTER — Other Ambulatory Visit: Payer: Self-pay

## 2020-03-04 ENCOUNTER — Encounter: Payer: Self-pay | Admitting: Internal Medicine

## 2020-03-04 ENCOUNTER — Ambulatory Visit (INDEPENDENT_AMBULATORY_CARE_PROVIDER_SITE_OTHER): Payer: Medicare HMO | Admitting: Internal Medicine

## 2020-03-04 VITALS — BP 118/73 | HR 88 | Temp 98.1°F | Resp 18 | Ht 68.0 in | Wt 132.4 lb

## 2020-03-04 DIAGNOSIS — E876 Hypokalemia: Secondary | ICD-10-CM | POA: Diagnosis not present

## 2020-03-04 DIAGNOSIS — D509 Iron deficiency anemia, unspecified: Secondary | ICD-10-CM | POA: Diagnosis not present

## 2020-03-04 DIAGNOSIS — Z09 Encounter for follow-up examination after completed treatment for conditions other than malignant neoplasm: Secondary | ICD-10-CM | POA: Diagnosis not present

## 2020-03-04 DIAGNOSIS — E039 Hypothyroidism, unspecified: Secondary | ICD-10-CM | POA: Diagnosis not present

## 2020-03-04 DIAGNOSIS — F419 Anxiety disorder, unspecified: Secondary | ICD-10-CM | POA: Diagnosis not present

## 2020-03-04 DIAGNOSIS — F32A Depression, unspecified: Secondary | ICD-10-CM

## 2020-03-04 DIAGNOSIS — E785 Hyperlipidemia, unspecified: Secondary | ICD-10-CM | POA: Diagnosis not present

## 2020-03-04 DIAGNOSIS — N179 Acute kidney failure, unspecified: Secondary | ICD-10-CM

## 2020-03-04 DIAGNOSIS — E559 Vitamin D deficiency, unspecified: Secondary | ICD-10-CM | POA: Diagnosis not present

## 2020-03-04 DIAGNOSIS — I10 Essential (primary) hypertension: Secondary | ICD-10-CM

## 2020-03-04 DIAGNOSIS — D75839 Thrombocytosis, unspecified: Secondary | ICD-10-CM | POA: Diagnosis not present

## 2020-03-04 DIAGNOSIS — R531 Weakness: Secondary | ICD-10-CM

## 2020-03-04 DIAGNOSIS — E782 Mixed hyperlipidemia: Secondary | ICD-10-CM

## 2020-03-04 NOTE — Patient Instructions (Signed)
Please continue to take medications as prescribed.  Please take small, frequent meals. Avoid skipping any meal. Please stay hydrated by taking at least 50 ounces of fluid in a day.  Please use humidifier at nighttime. Okay to use Ocean spray/Nasal saline spray for nasal congestion.

## 2020-03-04 NOTE — Progress Notes (Signed)
Established Patient Office Visit  Subjective:  Patient ID: Kelly Douglas, female    DOB: Jan 25, 1955  Age: 65 y.o. MRN: [id number]  CC:  Chief Complaint  Patient presents with  . Hospitalization Follow-up    Pt was discharged 02-25-20 she was at [facility] she is feeling much better the last two days    HPI Kelly Douglas is 65 year old female with past medical history of lung CA, scleroderma, HTN, HLD, GERD, hypothyroidism, depression, anxiety and tobacco abuse who presents for follow-up after being discharged from the hospital.  She was admitted for generalized weakness due to electrolyte imbalance and AKI from 02/06-02/08. She was given IV fluids and her electrolytes as well as her kidney function improved. She also had been having loss of appetite, and Remeron was added considering history of depression.  She has been feeling well now.  She has been taking her medications regularly.  She continues to smoke, but has been trying to cut back.  She complains of having nasal congestion and intermittent sinus pressure.  She denies any fever, chills, sore throat or change in taste or smell sensation.  Past Medical History:  Diagnosis Date  . Anxiety   . Arteriosclerotic cardiovascular disease (ASCVD) 2013   coronary calcification-left main, LAD and CX; small pericardial effusion  . Arthritis    hips  . Cancer ([place])    Lung Cancer   . Chronic back pain   . Chronic bronchitis   . Chronic lung disease    Chronic scarring and volume loss-left lung; characteristics of a chronic infectious process-possible MAI  . Depression   . Dyspnea    occasional   . GERD (gastroesophageal reflux disease)    Dr Gala Romney EGD 09/2009->esophagitis, sm HH, antral erosions, atonic esophagus  . Hyperlipidemia   . Hypertension   . Hypothyroid 1981 approx  . Scleroderma ([place])   . Seasonal allergies   . Syncope    Multiple spells over the past 40+ years, likely neurocardiogenic  . Tobacco abuse  06/24/2009    Past Surgical History:  Procedure Laterality Date  . BILATERAL SALPINGOOPHORECTOMY  2013    Dr. Elonda Husky; uterus remains in situ  . BRONCHIAL BIOPSY  11/21/2019   Procedure: BRONCHIAL BIOPSIES;  Surgeon: Collene Gobble, MD;  Location: [facility] ENDOSCOPY;  Service: Pulmonary;;  . BRONCHIAL BRUSHINGS  11/21/2019   Procedure: BRONCHIAL BRUSHINGS;  Surgeon: Collene Gobble, MD;  Location: [facility] ENDOSCOPY;  Service: Pulmonary;;  . BRONCHIAL NEEDLE ASPIRATION BIOPSY  11/21/2019   Procedure: BRONCHIAL NEEDLE ASPIRATION BIOPSIES;  Surgeon: Collene Gobble, MD;  Location: [place];  Service: Pulmonary;;  . BRONCHIAL WASHINGS  11/21/2019   Procedure: BRONCHIAL WASHINGS;  Surgeon: Collene Gobble, MD;  Location: [facility] ENDOSCOPY;  Service: Pulmonary;;  . COLONOSCOPY  09/28/09   anal papilla otherwise normal  . COLONOSCOPY N/A 08/29/2012   [id number] polyp-removed as described above. tubular adenoma  . COLONOSCOPY WITH PROPOFOL N/A 11/11/2019   Procedure: COLONOSCOPY WITH PROPOFOL;  Surgeon: Daneil Dolin, MD;  [facility] ENDO SU[facility];  Service: Endoscopy;  Laterality: N/A;  2:30pm  . ESOPHAGOGASTRODUODENOSCOPY  10/07/09   Dr. Barrie Dunker of esophageal mucosa, diffusely ?esophagitis (bx benign), small HH/antral erosions, erythema bx benign. 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 ESOPHAGOGASTRODUODENOSCOPY (EGD) WITH ESOPHAGEAL DILATION N/A 03/07/2012   [id number], patent, tubular esophagus of uncertain significance-status post biopsy )unremarkable). Hiatal hernia  . FUDUCIAL PLACEMENT  10/18/2017   Procedure: PLACEMENT OF 3 FUDUCIAL INTO LEFT LOWER LOBE OF LUNG-TARGET 1;  Surgeon: Collene Gobble, MD;  Location: [place];  Service: Thoracic;;  . LAPAROSCOPIC APPENDECTOMY  07/16/2011   Procedure: APPENDECTOMY LAPAROSCOPIC;  Surgeon: Donato Heinz, MD;  Location: [facility] ORS;  Service: General;  Laterality: N/A;  . POLYPECTOMY  11/11/2019   Procedure: POLYPECTOMY;  Surgeon: Daneil Dolin, MD;   Location: [facility] ENDO SUITE;  Service: Endoscopy;;  . TUBAL LIGATION    . VIDEO BRONCHOSCOPY  10/18/2017  . VIDEO BRONCHOSCOPY WITH ENDOBRONCHIAL NAVIGATION N/A 10/18/2017   Procedure: VIDEO BRONCHOSCOPY WITH ENDOBRONCHIAL NAVIGATION;  Surgeon: Collene Gobble, MD;  Location: [facility] OR;  Service: Thoracic;  Laterality: N/A;  . VIDEO BRONCHOSCOPY WITH ENDOBRONCHIAL NAVIGATION Left 11/21/2019   Procedure: VIDEO BRONCHOSCOPY WITH ENDOBRONCHIAL NAVIGATION;  Surgeon: Collene Gobble, MD;  Location: [place] ENDOSCOPY;  Service: Pulmonary;  Laterality: Left;    Family History  Problem Relation Age of Onset  . Stroke Father   . Aneurysm Father   . Coronary artery disease Brother   . Hypertension Brother   . Down syndrome Brother   . Hypertension Brother   . Hypertension Brother        father/mother  . Aneurysm Mother   . Throat cancer Brother 49       throat cancer  . Hypertension Sister   . Diabetes Brother   . Diabetes Brother   . Anesthesia problems Neg Hx   . Hypotension Neg Hx   . Malignant hyperthermia Neg Hx   . Pseudochol deficiency Neg Hx   . Colon cancer Neg Hx     Social History   Socioeconomic History  . Marital status: Married    Spouse name: Not on file  . Number of children: 2  . Years of education: Not on file  . Highest education level: Not on file  Occupational History  . Occupation: disabled    Fish farm manager: NOT EMPLOYED  Tobacco Use  . Smoking status: Current Every Day Smoker    Packs/day: 0.50    Years: 45.00    Pack years: 22.50    Types: Cigarettes    Start date: 07/14/1971  . Smokeless tobacco: Never Used  . Tobacco comment: 0.5 packs of cigarettes smoked daily ARJ 12/31/19  Vaping Use  . Vaping Use: Never used  Substance and Sexual Activity  . Alcohol use: Not Currently    Alcohol/week: 0.0 standard drinks    Comment: quit June 2012-used to drink 6 pack daily, occasional beer per pt 11/20/19  . Drug use: No  . Sexual activity: Yes    Birth control/protection:  Post-menopausal  Other Topics Concern  . Not on file  Social History Narrative  . Not on file   Social Determinants of Health   Financial Resource Strain: Low Risk   . Difficulty of Paying Living Expenses: Not hard at all  Food Insecurity: No Food Insecurity  . Worried About Charity fundraiser in the Last Year: Never true  . Ran Out of Food in the Last Year: Never true  Transportation Needs: No Transportation Needs  . Lack of Transportation (Medical): No  . Lack of Transportation (Non-Medical): No  Physical Activity: Insufficiently Active  . Days of Exercise per Week: 1 day  . Minutes of Exercise per Session: 10 min  Stress: No Stress Concern Present  . Feeling of Stress : Only a little  Social Connections: Moderately Integrated  . Frequency of Communication with Friends and Family: More than three times a week  . Frequency of Social Gatherings with Friends  and Family: More than three times a week  . Attends Religious Services: 1 to 4 times per year  . Active Member of Clubs or Organizations: No  . Attends Archivist Meetings: Never  . Marital Status: Married  Human resources officer Violence: Not At Risk  . Fear of Current or Ex-Partner: No  . Emotionally Abused: No  . Physically Abused: No  . Sexually Abused: No    Outpatient Medications Prior to Visit  Medication Sig Dispense Refill  . acetaminophen (TYLENOL) 650 MG CR tablet Take 1,300 mg by mouth every 8 (eight) hours as needed for pain.     Marland Kitchen atorvastatin (LIPITOR) 10 MG tablet Take 1 tablet (10 mg total) by mouth daily. 30 tablet 5  . Cholecalciferol (VITAMIN D) 125 MCG (5000 UT) CAPS Take 5,000 Units by mouth daily.     . DULoxetine (CYMBALTA) 60 MG capsule TAKE 1 CAPSULE EVERY DAY 90 capsule 0  . guaiFENesin-dextromethorphan (ROBITUSSIN DM) 100-10 MG/5ML syrup Take 5 mLs by mouth every 4 (four) hours as needed for cough. 118 mL 0  . levothyroxine (SYNTHROID) 75 MCG tablet TAKE 1 TABLET BY MOUTH  EVERY OTHER DAY  ALTERNATING WITH 1/2 TABLET EVERY OTHER DAY (Patient taking differently: Take 75 mcg by mouth See admin instructions. Take 1 tablet by mouth every other day alternating wirh 1/2 tablet every other day) 68 tablet 0  . Lidocaine-Menthol 4-1 % PTCH Apply 1 patch topically daily as needed (pain).     . Magnesium 500 MG CAPS Take 1 capsule by mouth daily.     . mirtazapine (REMERON) 7.5 MG tablet Take 1 tablet (7.5 mg total) by mouth at bedtime. 30 tablet 1  . montelukast (SINGULAIR) 10 MG tablet Take 1 tablet (10 mg total) by mouth at bedtime. 90 tablet 1  . Multiple Vitamin (MULTIVITAMIN WITH MINERALS) TABS tablet Take 1 tablet by mouth daily.    . pantoprazole (PROTONIX) 40 MG tablet TAKE 1 TABLET TWICE DAILY 180 tablet 0  . potassium chloride (KLOR-CON 10) 10 MEQ tablet Take 1 tablet (10 mEq total) by mouth daily. 90 tablet 3  . PROAIR RESPICLICK 503 (90 Base) MCG/ACT AEPB Inhale 2 puffs into the lungs 4 (four) times daily as needed. 1 each 5  . hydrOXYzine (ATARAX/VISTARIL) 25 MG tablet Take 1 tablet (25 mg total) by mouth 3 (three) times daily as needed for anxiety or itching. (Patient not taking: No sig reported) 30 tablet 0  . oxyCODONE (OXY IR/ROXICODONE) 5 MG immediate release tablet Take 1 tablet (5 mg total) by mouth every 4 (four) hours as needed for moderate pain. (Patient not taking: No sig reported) 30 tablet 0   No facility-administered medications prior to visit.    Allergies  Allergen Reactions  . Aspirin Other (See Comments)    Nose bleeds    ROS Review of Systems  Constitutional: Positive for fatigue. Negative for chills and fever.  HENT: Positive for postnasal drip. Negative for congestion, sinus pressure, sinus pain and sore throat.   Eyes: Negative for pain and discharge.  Respiratory: Positive for cough. Negative for shortness of breath.   Cardiovascular: Negative for chest pain and palpitations.  Gastrointestinal: Negative for abdominal pain, constipation, diarrhea,  nausea and vomiting.  Endocrine: Negative for polydipsia and polyuria.  Genitourinary: Negative for dysuria and hematuria.  Musculoskeletal: Negative for neck pain and neck stiffness.  Skin: Negative for rash.  Neurological: Negative for dizziness and weakness.  Psychiatric/Behavioral: Negative for agitation and behavioral problems.  Objective:    Physical Exam Vitals reviewed.  Constitutional:      General: She is not in acute distress.    Appearance: She is not diaphoretic.  HENT:     Head: Normocephalic and atraumatic.     Nose: Nose normal. No congestion.     Mouth/Throat:     Mouth: Mucous membranes are moist.     Pharynx: No posterior oropharyngeal erythema.  Eyes:     General: No scleral icterus.    Extraocular Movements: Extraocular movements intact.     Pupils: Pupils are equal, round, and reactive to light.  Cardiovascular:     Rate and Rhythm: Normal rate and regular rhythm.     Pulses: Normal pulses.     Heart sounds: Normal heart sounds. No murmur heard.   Pulmonary:     Breath sounds: Normal breath sounds. No wheezing or rales.  Abdominal:     Palpations: Abdomen is soft.     Tenderness: There is no abdominal tenderness.  Musculoskeletal:     Cervical back: Neck supple. No tenderness.     Right lower leg: No edema.     Left lower leg: No edema.  Skin:    General: Skin is warm.     Findings: No rash.  Neurological:     General: No focal deficit present.     Mental Status: She is alert and oriented to person, place, and time.  Psychiatric:        Mood and Affect: Mood normal.        Behavior: Behavior normal.     BP 118/73 (BP Location: Right Arm, Patient Position: Sitting, Cuff Size: Normal)   Pulse 88   Temp 98.1 F (36.7 C) (Oral)   Resp 18   Ht 5\' 8"  (1.727 m)   Wt 132 lb 6.4 oz (60.1 kg)   LMP  (LMP Unknown)   BMI 20.13 kg/m  Wt Readings from Last 3 Encounters:  03/04/20 132 lb 6.4 oz (60.1 kg)  02/24/20 129 lb 10.1 oz (58.8 kg)   12/31/19 140 lb (63.5 kg)     Health Maintenance Due  Topic Date Due  . PAP SMEAR-Modifier  11/29/2019    There are no preventive care reminders to display for this patient.  Lab Results  Component Value Date   TSH 1.239 02/24/2020   Lab Results  Component Value Date   WBC 11.1 (H) 02/24/2020   HGB 10.0 (L) 02/24/2020   HCT 30.9 (L) 02/24/2020   MCV 80.1 02/24/2020   PLT 327 02/24/2020   Lab Results  Component Value Date   NA 137 02/25/2020   K 4.8 02/25/2020   CO2 25 02/25/2020   GLUCOSE 85 02/25/2020   BUN 13 02/25/2020   CREATININE 1.07 (H) 02/25/2020   BILITOT 0.6 02/24/2020   ALKPHOS 49 02/24/2020   AST 26 02/24/2020   ALT 26 02/24/2020   PROT 6.1 (L) 02/24/2020   ALBUMIN 3.4 (L) 02/24/2020   CALCIUM 8.7 (L) 02/25/2020   ANIONGAP 6 02/25/2020   GFR 74.36 11/04/2019   Lab Results  Component Value Date   CHOL 132 09/20/2019   Lab Results  Component Value Date   HDL 31 (L) 09/20/2019   Lab Results  Component Value Date   LDLCALC 42 09/20/2019   Lab Results  Component Value Date   TRIG 398 (H) 09/20/2019   Lab Results  Component Value Date   CHOLHDL 4.3 09/20/2019   Lab Results  Component Value Date  HGBA1C 5.1 10/27/2015      Assessment & Plan:   Hospital discharge follow-up Chart reviewed from hospitalizatioon including discharge summary Medications reconciled and reviewed with the patient  AKI Improved with IV hydration Check BMP  HTN BP Readings from Last 1 Encounters:  03/04/20 118/73   Well-controlled Advised DASH diet and moderate exercise/walking, at least 150 mins/week   Anxiety and depression On Cymbalta Recently added Remeron for improving appetite and weight gain    Generalized weakness Likely due to dehydration Anorexia likely due to underlying lung CA Advised to take small, frequent meals Advised to avoid skipping any meal Adequate hydration advised Check CBC, BMP, magnesium  No orders of the defined  types were placed in this encounter.   Follow-up: Return in about 1 week (around 03/11/2020).    Lindell Spar, MD

## 2020-03-04 NOTE — Progress Notes (Deleted)
Established Patient Office Visit  Subjective:  Patient ID: Kelly Douglas, female    DOB: April 25, 1955  Age: 65 y.o. MRN: [id number]  CC:  Chief Complaint  Patient presents with  . Hospitalization Follow-up    Pt was discharged 02-25-20 she was at [facility] she is feeling much better the last two days    HPI Kelly Douglas presents for ***  Past Medical History:  Diagnosis Date  . Anxiety   . Arteriosclerotic cardiovascular disease (ASCVD) 2013   coronary calcification-left main, LAD and CX; small pericardial effusion  . Arthritis    hips  . Cancer ([place])    Lung Cancer   . Chronic back pain   . Chronic bronchitis   . Chronic lung disease    Chronic scarring and volume loss-left lung; characteristics of a chronic infectious process-possible MAI  . Depression   . Dyspnea    occasional   . GERD (gastroesophageal reflux disease)    Dr Gala Romney EGD 09/2009->esophagitis, sm HH, antral erosions, atonic esophagus  . Hyperlipidemia   . Hypertension   . Hypothyroid 1981 approx  . Scleroderma ([place])   . Seasonal allergies   . Syncope    Multiple spells over the past 40+ years, likely neurocardiogenic  . Tobacco abuse 06/24/2009    Past Surgical History:  Procedure Laterality Date  . BILATERAL SALPINGOOPHORECTOMY  2013    Dr. Elonda Husky; uterus remains in situ  . BRONCHIAL BIOPSY  11/21/2019   Procedure: BRONCHIAL BIOPSIES;  Surgeon: Collene Gobble, MD;  Location: [facility] ENDOSCOPY;  Service: Pulmonary;;  . BRONCHIAL BRUSHINGS  11/21/2019   Procedure: BRONCHIAL BRUSHINGS;  Surgeon: Collene Gobble, MD;  Location: [facility] ENDOSCOPY;  Service: Pulmonary;;  . BRONCHIAL NEEDLE ASPIRATION BIOPSY  11/21/2019   Procedure: BRONCHIAL NEEDLE ASPIRATION BIOPSIES;  Surgeon: Collene Gobble, MD;  Location: [place];  Service: Pulmonary;;  . BRONCHIAL WASHINGS  11/21/2019   Procedure: BRONCHIAL WASHINGS;  Surgeon: Collene Gobble, MD;  Location: [facility] ENDOSCOPY;  Service: Pulmonary;;  . COLONOSCOPY  09/28/09    anal papilla otherwise normal  . COLONOSCOPY N/A 08/29/2012   GUY:[id number] polyp-removed as described above. tubular adenoma  . COLONOSCOPY WITH PROPOFOL N/A 11/11/2019   Procedure: COLONOSCOPY WITH PROPOFOL;  Surgeon: Daneil Dolin, MD;  [facility]: [facility]O [facility];  Service: Endoscopy;  Laterality: N/A;  2:30pm  . ESOPHAGOGASTRODUODENOSCOPY  10/07/09   Dr. Barrie Dunker of esophageal mucosa, diffusely ?esophagitis (bx benign), small HH/antral erosions, erythema bx benign. 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 ESOPHAGOGASTRODUODENOSCOPY (EGD) WITH ESOPHAGEAL DILATION N/A 03/07/2012   VZD:GLOVFIEP, patent, tubular esophagus of uncertain significance-status post biopsy )unremarkable). Hiatal hernia  . FUDUCIAL PLACEMENT  10/18/2017   Procedure: PLACEMENT OF 3 FUDUCIAL INTO LEFT LOWER LOBE OF LUNG-TARGET 1;  Surgeon: Collene Gobble, MD;  Location: [place];  Service: Thoracic;;  . LAPAROSCOPIC APPENDECTOMY  07/16/2011   Procedure: APPENDECTOMY LAPAROSCOPIC;  Surgeon: Donato Heinz, MD;  Location: [facility] ORS;  Service: General;  Laterality: N/A;  . POLYPECTOMY  11/11/2019   Procedure: POLYPECTOMY;  Surgeon: Daneil Dolin, MD;  Location: [facility] ENDO [facility];  Service: Endoscopy;;  . TUBAL LIGATION    . VIDEO BRONCHOSCOPY  10/18/2017  . VIDEO BRONCHOSCOPY WITH ENDOBRONCHIAL NAVIGATION N/A 10/18/2017   Procedure: VIDEO BRONCHOSCOPY WITH ENDOBRONCHIAL NAVIGATION;  Surgeon: Collene Gobble, MD;  Location: [place];  Service: Thoracic;  Laterality: N/A;  . VIDEO BRONCHOSCOPY WITH ENDOBRONCHIAL NAVIGATION Left 11/21/2019   Procedure: VIDEO BRONCHOSCOPY WITH ENDOBRONCHIAL NAVIGATION;  Surgeon:  Collene Gobble, MD;  Location: [facility] ENDOSCOPY;  Service: Pulmonary;  Laterality: Left;    Family History  Problem Relation Age of Onset  . Stroke Father   . Aneurysm Father   . Coronary artery disease Brother   . Hypertension Brother   . Down syndrome Brother   . Hypertension Brother   . Hypertension Brother         father/mother  . Aneurysm Mother   . Throat cancer Brother 49       throat cancer  . Hypertension Sister   . Diabetes Brother   . Diabetes Brother   . Anesthesia problems Neg Hx   . Hypotension Neg Hx   . Malignant hyperthermia Neg Hx   . Pseudochol deficiency Neg Hx   . Colon cancer Neg Hx     Social History   Socioeconomic History  . Marital status: Married    Spouse name: Not on file  . Number of children: 2  . Years of education: Not on file  . Highest education level: Not on file  Occupational History  . Occupation: disabled    Fish farm manager: NOT EMPLOYED  Tobacco Use  . Smoking status: Current Every Day Smoker    Packs/day: 0.50    Years: 45.00    Pack years: 22.50    Types: Cigarettes    Start date: 07/14/1971  . Smokeless tobacco: Never Used  . Tobacco comment: 0.5 packs of cigarettes smoked daily ARJ 12/31/19  Vaping Use  . Vaping Use: Never used  Substance and Sexual Activity  . Alcohol use: Not Currently    Alcohol/week: 0.0 standard drinks    Comment: quit June 2012-used to drink 6 pack daily, occasional beer per pt 11/20/19  . Drug use: No  . Sexual activity: Yes    Birth control/protection: Post-menopausal  Other Topics Concern  . Not on file  Social History Narrative  . Not on file   Social Determinants of Health   Financial Resource Strain: Low Risk   . Difficulty of Paying Living Expenses: Not hard at all  Food Insecurity: No Food Insecurity  . Worried About Charity fundraiser in the Last Year: Never true  . Ran Out of Food in the Last Year: Never true  Transportation Needs: No Transportation Needs  . Lack of Transportation (Medical): No  . Lack of Transportation (Non-Medical): No  Physical Activity: Insufficiently Active  . Days of Exercise per Week: 1 day  . Minutes of Exercise per Session: 10 min  Stress: No Stress Concern Present  . Feeling of Stress : Only a little  Social Connections: Moderately Integrated  . Frequency of  Communication with Friends and Family: More than three times a week  . Frequency of Social Gatherings with Friends and Family: More than three times a week  . Attends Religious Services: 1 to 4 times per year  . Active Member of Clubs or Organizations: No  . Attends Archivist Meetings: Never  . Marital Status: Married  Human resources officer Violence: Not At Risk  . Fear of Current or Ex-Partner: No  . Emotionally Abused: No  . Physically Abused: No  . Sexually Abused: No    Outpatient Medications Prior to Visit  Medication Sig Dispense Refill  . acetaminophen (TYLENOL) 650 MG CR tablet Take 1,300 mg by mouth every 8 (eight) hours as needed for pain.     Marland Kitchen atorvastatin (LIPITOR) 10 MG tablet Take 1 tablet (10 mg total) by mouth daily. 30 tablet  5  . Cholecalciferol (VITAMIN D) 125 MCG (5000 UT) CAPS Take 5,000 Units by mouth daily.     . DULoxetine (CYMBALTA) 60 MG capsule TAKE 1 CAPSULE EVERY DAY 90 capsule 0  . guaiFENesin-dextromethorphan (ROBITUSSIN DM) 100-10 MG/5ML syrup Take 5 mLs by mouth every 4 (four) hours as needed for cough. 118 mL 0  . levothyroxine (SYNTHROID) 75 MCG tablet TAKE 1 TABLET BY MOUTH  EVERY OTHER DAY ALTERNATING WITH 1/2 TABLET EVERY OTHER DAY (Patient taking differently: Take 75 mcg by mouth See admin instructions. Take 1 tablet by mouth every other day alternating wirh 1/2 tablet every other day) 68 tablet 0  . Lidocaine-Menthol 4-1 % PTCH Apply 1 patch topically daily as needed (pain).     . Magnesium 500 MG CAPS Take 1 capsule by mouth daily.     . mirtazapine (REMERON) 7.5 MG tablet Take 1 tablet (7.5 mg total) by mouth at bedtime. 30 tablet 1  . montelukast (SINGULAIR) 10 MG tablet Take 1 tablet (10 mg total) by mouth at bedtime. 90 tablet 1  . Multiple Vitamin (MULTIVITAMIN WITH MINERALS) TABS tablet Take 1 tablet by mouth daily.    . pantoprazole (PROTONIX) 40 MG tablet TAKE 1 TABLET TWICE DAILY 180 tablet 0  . potassium chloride (KLOR-CON 10) 10  MEQ tablet Take 1 tablet (10 mEq total) by mouth daily. 90 tablet 3  . PROAIR RESPICLICK 381 (90 Base) MCG/ACT AEPB Inhale 2 puffs into the lungs 4 (four) times daily as needed. 1 each 5  . hydrOXYzine (ATARAX/VISTARIL) 25 MG tablet Take 1 tablet (25 mg total) by mouth 3 (three) times daily as needed for anxiety or itching. (Patient not taking: No sig reported) 30 tablet 0  . oxyCODONE (OXY IR/ROXICODONE) 5 MG immediate release tablet Take 1 tablet (5 mg total) by mouth every 4 (four) hours as needed for moderate pain. (Patient not taking: No sig reported) 30 tablet 0   No facility-administered medications prior to visit.    Allergies  Allergen Reactions  . Aspirin Other (See Comments)    Nose bleeds    ROS Review of Systems    Objective:    Physical Exam  BP 118/73 (BP Location: Right Arm, Patient Position: Sitting, Cuff Size: Normal)   Pulse 88   Temp 98.1 F (36.7 C) (Oral)   Resp 18   Ht 5\' 8"  (1.727 m)   Wt 132 lb 6.4 oz (60.1 kg)   LMP  (LMP Unknown)   BMI 20.13 kg/m  Wt Readings from Last 3 Encounters:  03/04/20 132 lb 6.4 oz (60.1 kg)  02/24/20 129 lb 10.1 oz (58.8 kg)  12/31/19 140 lb (63.5 kg)     Health Maintenance Due  Topic Date Due  . PAP SMEAR-Modifier  11/29/2019    There are no preventive care reminders to display for this patient.  Lab Results  Component Value Date   TSH 1.239 02/24/2020   Lab Results  Component Value Date   WBC 11.1 (H) 02/24/2020   HGB 10.0 (L) 02/24/2020   HCT 30.9 (L) 02/24/2020   MCV 80.1 02/24/2020   PLT 327 02/24/2020   Lab Results  Component Value Date   NA 137 02/25/2020   K 4.8 02/25/2020   CO2 25 02/25/2020   GLUCOSE 85 02/25/2020   BUN 13 02/25/2020   CREATININE 1.07 (H) 02/25/2020   BILITOT 0.6 02/24/2020   ALKPHOS 49 02/24/2020   AST 26 02/24/2020   ALT 26 02/24/2020   PROT 6.1 (  L) 02/24/2020   ALBUMIN 3.4 (L) 02/24/2020   CALCIUM 8.7 (L) 02/25/2020   ANIONGAP 6 02/25/2020   GFR 74.36 11/04/2019    Lab Results  Component Value Date   CHOL 132 09/20/2019   Lab Results  Component Value Date   HDL 31 (L) 09/20/2019   Lab Results  Component Value Date   LDLCALC 42 09/20/2019   Lab Results  Component Value Date   TRIG 398 (H) 09/20/2019   Lab Results  Component Value Date   CHOLHDL 4.3 09/20/2019   Lab Results  Component Value Date   HGBA1C 5.1 10/27/2015      Assessment & Plan:   Problem List Items Addressed This Visit      Genitourinary   AKI (acute kidney injury) ([place])   Relevant Orders   Basic Metabolic Panel (BMET)   Magnesium     Other   Hyperlipidemia   Relevant Orders   Lipid Profile   Anxiety and depression   Generalized weakness   Relevant Orders   Vitamin D (25 hydroxy)    Other Visit Diagnoses    Hospital discharge follow-up    -  Primary   Relevant Orders   Basic Metabolic Panel (BMET)   Magnesium   CBC      No orders of the defined types were placed in this encounter.   Follow-up: Return in about 1 week (around 03/11/2020).    Lindell Spar, MD

## 2020-03-05 ENCOUNTER — Other Ambulatory Visit: Payer: Self-pay | Admitting: Internal Medicine

## 2020-03-05 LAB — VITAMIN D 25 HYDROXY (VIT D DEFICIENCY, FRACTURES): Vit D, 25-Hydroxy: 76.1 ng/mL (ref 30.0–100.0)

## 2020-03-05 LAB — CBC
Hematocrit: 31.9 % — ABNORMAL LOW (ref 34.0–46.6)
Hemoglobin: 10.6 g/dL — ABNORMAL LOW (ref 11.1–15.9)
MCH: 26.3 pg — ABNORMAL LOW (ref 26.6–33.0)
MCHC: 33.2 g/dL (ref 31.5–35.7)
MCV: 79 fL (ref 79–97)
Platelets: 380 x10E3/uL (ref 150–450)
RBC: 4.03 x10E6/uL (ref 3.77–5.28)
RDW: 14.9 % (ref 11.7–15.4)
WBC: 10.2 x10E3/uL (ref 3.4–10.8)

## 2020-03-05 LAB — BASIC METABOLIC PANEL WITH GFR
BUN/Creatinine Ratio: 10 — ABNORMAL LOW (ref 12–28)
BUN: 11 mg/dL (ref 8–27)
CO2: 22 mmol/L (ref 20–29)
Calcium: 8.7 mg/dL (ref 8.7–10.3)
Chloride: 103 mmol/L (ref 96–106)
Creatinine, Ser: 1.14 mg/dL — ABNORMAL HIGH (ref 0.57–1.00)
GFR calc Af Amer: 59 mL/min/1.73 — ABNORMAL LOW (ref >59)
GFR calc non Af Amer: 51 mL/min/1.73 — ABNORMAL LOW (ref >59)
Glucose: 81 mg/dL (ref 65–99)
Potassium: 3.8 mmol/L (ref 3.5–5.2)
Sodium: 143 mmol/L (ref 134–144)

## 2020-03-05 LAB — LIPID PANEL
Chol/HDL Ratio: 3.3 ratio (ref 0.0–4.4)
Cholesterol, Total: 118 mg/dL (ref 100–199)
HDL: 36 mg/dL — ABNORMAL LOW (ref >39)
LDL Chol Calc (NIH): 62 mg/dL (ref 0–99)
Triglycerides: 105 mg/dL (ref 0–149)
VLDL Cholesterol Cal: 20 mg/dL (ref 5–40)

## 2020-03-05 LAB — MAGNESIUM: Magnesium: 0.5 mg/dL — CL (ref 1.6–2.3)

## 2020-03-05 MED ORDER — MAGNESIUM 500 MG PO CAPS: 1.0000 | ORAL_CAPSULE | Freq: Two times a day (BID) | ORAL | 0 refills | Status: DC

## 2020-03-06 ENCOUNTER — Encounter: Payer: Medicare HMO | Admitting: Thoracic Surgery (Cardiothoracic Vascular Surgery)

## 2020-03-06 ENCOUNTER — Other Ambulatory Visit: Payer: Self-pay | Admitting: Family Medicine

## 2020-03-10 ENCOUNTER — Emergency Department (HOSPITAL_COMMUNITY): Payer: Medicare HMO

## 2020-03-10 ENCOUNTER — Other Ambulatory Visit: Payer: Self-pay | Admitting: Family Medicine

## 2020-03-10 ENCOUNTER — Observation Stay (HOSPITAL_COMMUNITY)
Admission: EM | Admit: 2020-03-10 | Discharge: 2020-03-12 | Disposition: A | Discharge status: Home / Self Care | Payer: Medicare HMO | Attending: Internal Medicine | Admitting: Internal Medicine

## 2020-03-10 ENCOUNTER — Encounter (HOSPITAL_COMMUNITY): Payer: Self-pay

## 2020-03-10 ENCOUNTER — Other Ambulatory Visit: Payer: Self-pay

## 2020-03-10 ENCOUNTER — Telehealth: Payer: Self-pay

## 2020-03-10 DIAGNOSIS — F419 Anxiety disorder, unspecified: Secondary | ICD-10-CM | POA: Diagnosis present

## 2020-03-10 DIAGNOSIS — J449 Chronic obstructive pulmonary disease, unspecified: Secondary | ICD-10-CM | POA: Diagnosis not present

## 2020-03-10 DIAGNOSIS — I10 Essential (primary) hypertension: Secondary | ICD-10-CM | POA: Diagnosis present

## 2020-03-10 DIAGNOSIS — Z85118 Personal history of other malignant neoplasm of bronchus and lung: Secondary | ICD-10-CM | POA: Insufficient documentation

## 2020-03-10 DIAGNOSIS — F17210 Nicotine dependence, cigarettes, uncomplicated: Secondary | ICD-10-CM | POA: Diagnosis not present

## 2020-03-10 DIAGNOSIS — Z20822 Contact with and (suspected) exposure to covid-19: Secondary | ICD-10-CM | POA: Insufficient documentation

## 2020-03-10 DIAGNOSIS — C3490 Malignant neoplasm of unspecified part of unspecified bronchus or lung: Secondary | ICD-10-CM | POA: Diagnosis not present

## 2020-03-10 DIAGNOSIS — Z79899 Other long term (current) drug therapy: Secondary | ICD-10-CM | POA: Insufficient documentation

## 2020-03-10 DIAGNOSIS — N179 Acute kidney failure, unspecified: Secondary | ICD-10-CM | POA: Diagnosis not present

## 2020-03-10 DIAGNOSIS — R5381 Other malaise: Secondary | ICD-10-CM | POA: Diagnosis not present

## 2020-03-10 DIAGNOSIS — J42 Unspecified chronic bronchitis: Secondary | ICD-10-CM | POA: Diagnosis not present

## 2020-03-10 DIAGNOSIS — R778 Other specified abnormalities of plasma proteins: Secondary | ICD-10-CM | POA: Diagnosis present

## 2020-03-10 DIAGNOSIS — C3492 Malignant neoplasm of unspecified part of left bronchus or lung: Secondary | ICD-10-CM | POA: Diagnosis present

## 2020-03-10 DIAGNOSIS — R450 Nervousness: Secondary | ICD-10-CM | POA: Diagnosis not present

## 2020-03-10 DIAGNOSIS — F32A Depression, unspecified: Secondary | ICD-10-CM | POA: Diagnosis present

## 2020-03-10 DIAGNOSIS — R531 Weakness: Secondary | ICD-10-CM | POA: Diagnosis not present

## 2020-03-10 DIAGNOSIS — R251 Tremor, unspecified: Secondary | ICD-10-CM | POA: Diagnosis not present

## 2020-03-10 DIAGNOSIS — R7989 Other specified abnormal findings of blood chemistry: Secondary | ICD-10-CM | POA: Diagnosis not present

## 2020-03-10 DIAGNOSIS — E871 Hypo-osmolality and hyponatremia: Secondary | ICD-10-CM | POA: Diagnosis not present

## 2020-03-10 DIAGNOSIS — E039 Hypothyroidism, unspecified: Secondary | ICD-10-CM | POA: Diagnosis present

## 2020-03-10 DIAGNOSIS — E876 Hypokalemia: Secondary | ICD-10-CM | POA: Diagnosis not present

## 2020-03-10 DIAGNOSIS — M349 Systemic sclerosis, unspecified: Secondary | ICD-10-CM | POA: Diagnosis not present

## 2020-03-10 DIAGNOSIS — I2510 Atherosclerotic heart disease of native coronary artery without angina pectoris: Secondary | ICD-10-CM | POA: Diagnosis not present

## 2020-03-10 DIAGNOSIS — R457 State of emotional shock and stress, unspecified: Secondary | ICD-10-CM | POA: Diagnosis not present

## 2020-03-10 LAB — CBC WITH DIFFERENTIAL/PLATELET
Abs Immature Granulocytes: 0.05 10*3/uL (ref 0.00–0.07)
Basophils Absolute: 0 10*3/uL (ref 0.0–0.1)
Basophils Relative: 0 %
Eosinophils Absolute: 0.1 10*3/uL (ref 0.0–0.5)
Eosinophils Relative: 1 %
HCT: 32.4 % — ABNORMAL LOW (ref 36.0–46.0)
Hemoglobin: 10.6 g/dL — ABNORMAL LOW (ref 12.0–15.0)
Immature Granulocytes: 1 %
Lymphocytes Relative: 33 %
Lymphs Abs: 3.6 10*3/uL (ref 0.7–4.0)
MCH: 26.4 pg (ref 26.0–34.0)
MCHC: 32.7 g/dL (ref 30.0–36.0)
MCV: 80.8 fL (ref 80.0–100.0)
Monocytes Absolute: 0.5 10*3/uL (ref 0.1–1.0)
Monocytes Relative: 5 %
Neutro Abs: 6.5 10*3/uL (ref 1.7–7.7)
Neutrophils Relative %: 60 %
Platelets: 365 10*3/uL (ref 150–400)
RBC: 4.01 MIL/uL (ref 3.87–5.11)
RDW: 16 % — ABNORMAL HIGH (ref 11.5–15.5)
WBC: 10.8 10*3/uL — ABNORMAL HIGH (ref 4.0–10.5)
nRBC: 0 % (ref 0.0–0.2)

## 2020-03-10 LAB — TROPONIN I (HIGH SENSITIVITY)
Troponin I (High Sensitivity): 70 ng/L — ABNORMAL HIGH (ref <18)
Troponin I (High Sensitivity): 317 ng/L (ref <18)
Troponin I (High Sensitivity): 171 ng/L (ref <18)

## 2020-03-10 LAB — COMPREHENSIVE METABOLIC PANEL
ALT: 13 U/L (ref 0–44)
AST: 23 U/L (ref 15–41)
Albumin: 3.6 g/dL (ref 3.5–5.0)
Alkaline Phosphatase: 48 U/L (ref 38–126)
Anion gap: 14 (ref 5–15)
BUN: 9 mg/dL (ref 8–23)
CO2: 21 mmol/L — ABNORMAL LOW (ref 22–32)
Calcium: 8.1 mg/dL — ABNORMAL LOW (ref 8.9–10.3)
Chloride: 102 mmol/L (ref 98–111)
Creatinine, Ser: 1.06 mg/dL — ABNORMAL HIGH (ref 0.44–1.00)
GFR, Estimated: 59 mL/min — ABNORMAL LOW (ref >60)
Glucose, Bld: 145 mg/dL — ABNORMAL HIGH (ref 70–99)
Potassium: 3.2 mmol/L — ABNORMAL LOW (ref 3.5–5.1)
Sodium: 137 mmol/L (ref 135–145)
Total Bilirubin: 0.9 mg/dL (ref 0.3–1.2)
Total Protein: 6.5 g/dL (ref 6.5–8.1)

## 2020-03-10 LAB — RESP PANEL BY RT-PCR (FLU A&B, COVID) ARPGX2
Influenza A by PCR: NEGATIVE
Influenza B by PCR: NEGATIVE
SARS Coronavirus 2 by RT PCR: NEGATIVE

## 2020-03-10 LAB — URINALYSIS, ROUTINE W REFLEX MICROSCOPIC
Bilirubin Urine: NEGATIVE
Glucose, UA: NEGATIVE mg/dL
Hgb urine dipstick: NEGATIVE
Ketones, ur: 5 mg/dL — AB
Leukocytes,Ua: NEGATIVE
Nitrite: NEGATIVE
Protein, ur: NEGATIVE mg/dL
Specific Gravity, Urine: 1.003 — ABNORMAL LOW (ref 1.005–1.030)
pH: 7 (ref 5.0–8.0)

## 2020-03-10 LAB — TSH: TSH: 2.578 u[IU]/mL (ref 0.350–4.500)

## 2020-03-10 LAB — RAPID URINE DRUG SCREEN, HOSP PERFORMED
Amphetamines: NOT DETECTED
Barbiturates: NOT DETECTED
Benzodiazepines: NOT DETECTED
Cocaine: NOT DETECTED
Opiates: NOT DETECTED
Tetrahydrocannabinol: NOT DETECTED

## 2020-03-10 LAB — LIPASE, BLOOD: Lipase: 24 U/L (ref 11–51)

## 2020-03-10 LAB — T4, FREE: Free T4: 1.23 ng/dL — ABNORMAL HIGH (ref 0.61–1.12)

## 2020-03-10 LAB — MAGNESIUM
Magnesium: 0.3 mg/dL — CL (ref 1.7–2.4)
Magnesium: 1.9 mg/dL (ref 1.7–2.4)

## 2020-03-10 MED ORDER — CALCIUM CARBONATE 1250 (500 CA) MG PO TABS
1.0000 | ORAL_TABLET | Freq: Two times a day (BID) | ORAL | Status: AC
  Administered 2020-03-10 – 2020-03-11 (×2): 500 mg via ORAL
  Filled 2020-03-10 (×4): qty 1

## 2020-03-10 MED ORDER — AMLODIPINE BESYLATE 5 MG PO TABS
5.0000 mg | ORAL_TABLET | Freq: Once | ORAL | Status: AC
  Administered 2020-03-10: 5 mg via ORAL
  Filled 2020-03-10: qty 1

## 2020-03-10 MED ORDER — LACTATED RINGERS IV BOLUS
1000.0000 mL | Freq: Once | INTRAVENOUS | Status: AC
  Administered 2020-03-10: 1000 mL via INTRAVENOUS

## 2020-03-10 MED ORDER — ONDANSETRON HCL 4 MG/2ML IJ SOLN
4.0000 mg | Freq: Once | INTRAMUSCULAR | Status: AC
  Administered 2020-03-10: 4 mg via INTRAVENOUS
  Filled 2020-03-10: qty 2

## 2020-03-10 MED ORDER — LORAZEPAM 2 MG/ML IJ SOLN
1.0000 mg | Freq: Once | INTRAMUSCULAR | Status: DC
  Filled 2020-03-10: qty 1

## 2020-03-10 MED ORDER — POTASSIUM CHLORIDE 10 MEQ/100ML IV SOLN
10.0000 meq | Freq: Once | INTRAVENOUS | Status: AC
  Administered 2020-03-10: 10 meq via INTRAVENOUS
  Filled 2020-03-10: qty 100

## 2020-03-10 MED ORDER — NICOTINE 21 MG/24HR TD PT24
21.0000 mg | MEDICATED_PATCH | Freq: Once | TRANSDERMAL | Status: AC
  Administered 2020-03-10: 21 mg via TRANSDERMAL
  Filled 2020-03-10: qty 1

## 2020-03-10 MED ORDER — POTASSIUM CHLORIDE CRYS ER 20 MEQ PO TBCR
40.0000 meq | EXTENDED_RELEASE_TABLET | Freq: Once | ORAL | Status: AC
  Administered 2020-03-10: 40 meq via ORAL
  Filled 2020-03-10: qty 2

## 2020-03-10 MED ORDER — MAGNESIUM SULFATE 2 GM/50ML IV SOLN
2.0000 g | Freq: Once | INTRAVENOUS | Status: AC
  Administered 2020-03-10: 2 g via INTRAVENOUS
  Filled 2020-03-10: qty 50

## 2020-03-10 MED ORDER — ASPIRIN 81 MG PO CHEW
324.0000 mg | CHEWABLE_TABLET | Freq: Once | ORAL | Status: AC
  Administered 2020-03-10: 324 mg via ORAL
  Filled 2020-03-10: qty 4

## 2020-03-10 MED ORDER — LORAZEPAM 2 MG/ML IJ SOLN
1.0000 mg | Freq: Once | INTRAMUSCULAR | Status: AC
  Administered 2020-03-10: 1 mg via INTRAVENOUS
  Filled 2020-03-10: qty 1

## 2020-03-10 NOTE — ED Provider Notes (Cosign Needed Addendum)
[facility] EMERGENCY DEPARTMENT Provider Note   CSN: [id number] Arrival date & time: 03/10/20  1331     History Chief Complaint  Patient presents with  . Anxiety    Kelly Douglas is a 65 y.o. female with past medical history of lung cancer, scleroderma, hypertension, GERD, hypothyroidism, depression/anxiety and tobacco use presents to the ED from home for evaluation of shakiness.  Per triage note patient was seen by home PT today who called 911 due to patient being very shaky and nervous, uncontrollably shaking.  Patient states she is here "for my nerves". Patient reports smoking half a pack of cigarettes daily for a long time and has been recently trying to cut back.  She is only had 1 cigarette today.  She states that over the last few days she has been very nervous.  When asked why she states that her brother who had Down syndrome is recently passed away last month.  She has had a hard time coping with this.  Has felt nauseated and been dry heaving but no emesis.  Denies formal diagnosis of depression and anxiety and states she does not take any medicines for it.  She had 1 cigarette today and states that it made her feel little bit better.  Eats jello during the day. States she has never had a good appetite.  Denies fever.  Denies infection symptoms like congestion, sore throat, cough.  No chest pain or shortness of breath.  No abdominal pain.  No diarrhea.  No dysuria.  Denies alcohol or recreational drug use including marijuana.  Reports several years ago she used to drink alcohol daily but she has quit since.  No interventions.  No modifying factors.   HPI     Past Medical History:  Diagnosis Date  . Anxiety   . Arteriosclerotic cardiovascular disease (ASCVD) 2013   coronary calcification-left main, LAD and CX; small pericardial effusion  . Arthritis    hips  . Cancer ([place])    Lung Cancer   . Chronic back pain   . Chronic bronchitis   . Chronic lung disease    Chronic  scarring and volume loss-left lung; characteristics of a chronic infectious process-possible MAI  . Depression   . Dyspnea    occasional   . GERD (gastroesophageal reflux disease)    Dr Gala Romney EGD 09/2009->esophagitis, sm HH, antral erosions, atonic esophagus  . Hyperlipidemia   . Hypertension   . Hypothyroid 1981 approx  . Scleroderma ([place])   . Seasonal allergies   . Syncope    Multiple spells over the past 40+ years, likely neurocardiogenic  . Tobacco abuse 06/24/2009    Patient Active Problem List   Diagnosis Date Noted  . Elevated troponin   . Generalized weakness   . Leukocytosis   . AKI (acute kidney injury) ([place]) 02/23/2020  . COPD (chronic obstructive pulmonary disease) ([place]) 12/31/2019  . Pneumothorax on left 12/10/2019  . Thrombocytosis 12/10/2019  . Hx of adenomatous colonic polyps 09/03/2019  . Cervical lymphadenopathy 05/16/2019  . Hypokalemia 03/28/2018  . S/P bronchoscopy 10/18/2017  . Adenocarcinoma of left lung, stage 1 ([place]) 11/18/2016  . Pollen allergies 05/19/2015  . Nicotine dependence 09/17/2013  . Hoarseness of voice 09/16/2013  . Cervical pain (neck) 06/21/2013  . Back pain with radiation 06/21/2013  . IDA (iron deficiency anemia) 08/10/2012  . HTN (hypertension) 02/22/2012  . Insomnia 05/09/2011  . GERD (gastroesophageal reflux disease)   . Scleroderma ([place])   . Hypothyroidism   .  Arteriosclerotic cardiovascular disease (ASCVD)   . Syncope   . Anxiety and depression 06/24/2009  . Vitamin D deficiency 03/18/2009  . Hyperlipidemia 03/18/2009    Past Surgical History:  Procedure Laterality Date  . BILATERAL SALPINGOOPHORECTOMY  2013    Dr. Elonda Husky; uterus remains in situ  . BRONCHIAL BIOPSY  11/21/2019   Procedure: BRONCHIAL BIOPSIES;  Surgeon: Collene Gobble, MD;  Location: [facility] ENDOSCOPY;  Service: Pulmonary;;  . BRONCHIAL BRUSHINGS  11/21/2019   Procedure: BRONCHIAL BRUSHINGS;  Surgeon: Collene Gobble, MD;  Location: [facility] ENDOSCOPY;  Service:  Pulmonary;;  . BRONCHIAL NEEDLE ASPIRATION BIOPSY  11/21/2019   Procedure: BRONCHIAL NEEDLE ASPIRATION BIOPSIES;  Surgeon: Collene Gobble, MD;  Location: [place];  Service: Pulmonary;;  . BRONCHIAL WASHINGS  11/21/2019   Procedure: BRONCHIAL WASHINGS;  Surgeon: Collene Gobble, MD;  Location: [facility] ENDOSCOPY;  Service: Pulmonary;;  . COLONOSCOPY  09/28/09   anal papilla otherwise normal  . COLONOSCOPY N/A 08/29/2012   AOZ:HYQMVHQ polyp-removed as described above. tubular adenoma  . COLONOSCOPY WITH PROPOFOL N/A 11/11/2019   Procedure: COLONOSCOPY WITH PROPOFOL;  Surgeon: Daneil Dolin, MD;  [facility]: [facility] ENDO SU[facility];  Service: Endoscopy;  Laterality: N/A;  2:30pm  . ESOPHAGOGASTRODUODENOSCOPY  10/07/09   Dr. Barrie Dunker of esophageal mucosa, diffusely ?esophagitis (bx benign), small HH/antral erosions, erythema bx benign. 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 ESOPHAGOGASTRODUODENOSCOPY (EGD) WITH ESOPHAGEAL DILATION N/A 03/07/2012   ION:[id number], patent, tubular esophagus of uncertain significance-status post biopsy )unremarkable). Hiatal hernia  . FUDUCIAL PLACEMENT  10/18/2017   Procedure: PLACEMENT OF 3 FUDUCIAL INTO LEFT LOWER LOBE OF LUNG-TARGET 1;  Surgeon: Collene Gobble, MD;  Location: [place];  Service: Thoracic;;  . LAPAROSCOPIC APPENDECTOMY  07/16/2011   Procedure: APPENDECTOMY LAPAROSCOPIC;  Surgeon: Donato Heinz, MD;  Location: [facility] ORS;  Service: General;  Laterality: N/A;  . POLYPECTOMY  11/11/2019   Procedure: POLYPECTOMY;  Surgeon: Daneil Dolin, MD;  Location: [facility] ENDO SUITE;  Service: Endoscopy;;  . TUBAL LIGATION    . VIDEO BRONCHOSCOPY  10/18/2017  . VIDEO BRONCHOSCOPY WITH ENDOBRONCHIAL NAVIGATION N/A 10/18/2017   Procedure: VIDEO BRONCHOSCOPY WITH ENDOBRONCHIAL NAVIGATION;  Surgeon: Collene Gobble, MD;  Location: [facility] OR;  Service: Thoracic;  Laterality: N/A;  . VIDEO BRONCHOSCOPY WITH ENDOBRONCHIAL NAVIGATION Left 11/21/2019   Procedure: VIDEO BRONCHOSCOPY  WITH ENDOBRONCHIAL NAVIGATION;  Surgeon: Collene Gobble, MD;  Location: [place] ENDOSCOPY;  Service: Pulmonary;  Laterality: Left;     OB History    Gravida  2   Para  2   Term  2   Preterm      AB      Living  2     SAB      IAB      Ectopic      Multiple      Live Births              Family History  Problem Relation Age of Onset  . Stroke Father   . Aneurysm Father   . Coronary artery disease Brother   . Hypertension Brother   . Down syndrome Brother   . Hypertension Brother   . Hypertension Brother        father/mother  . Aneurysm Mother   . Throat cancer Brother 49       throat cancer  . Hypertension Sister   . Diabetes Brother   . Diabetes Brother   . Anesthesia problems Neg Hx   . Hypotension Neg Hx   .  Malignant hyperthermia Neg Hx   . Pseudochol deficiency Neg Hx   . Colon cancer Neg Hx     Social History   Tobacco Use  . Smoking status: Current Every Day Smoker    Packs/day: 0.50    Years: 45.00    Pack years: 22.50    Types: Cigarettes    Start date: 07/14/1971  . Smokeless tobacco: Never Used  . Tobacco comment: 0.5 packs of cigarettes smoked daily ARJ 12/31/19  Vaping Use  . Vaping Use: Never used  Substance Use Topics  . Alcohol use: Not Currently    Alcohol/week: 0.0 standard drinks    Comment: quit June 2012-used to drink 6 pack daily, occasional beer per pt 11/20/19  . Drug use: No    Home Medications Prior to Admission medications   Medication Sig Start Date End Date Taking? Authorizing Provider  acetaminophen (TYLENOL) 650 MG CR tablet Take 1,300 mg by mouth every 8 (eight) hours as needed for pain.     [provider]  atorvastatin (LIPITOR) 10 MG tablet Take 1 tablet (10 mg total) by mouth daily. 10/16/19   [name], MD  Cholecalciferol (VITAMIN D) 125 MCG (5000 UT) CAPS Take 5,000 Units by mouth daily.     [provider]  DULoxetine (CYMBALTA) 60 MG capsule TAKE 1 CAPSULE EVERY DAY 01/07/20    [name], MD  guaiFENesin-dextromethorphan (ROBITUSSIN DM) 100-10 MG/5ML syrup Take 5 mLs by mouth every 4 (four) hours as needed for cough. 12/12/19   Swayze, Ava, DO  hydrOXYzine (VISTARIL) 50 MG capsule TAKE 1 TO 2 CAPSULES BY MOUTH AT BEDTIME FOR SLEEP 03/10/20   [name], MD  levothyroxine (SYNTHROID) 75 MCG tablet TAKE 1 TABLET EVERY OTHER DAY ALTERNATING WITH 1/2 TABLET EVERY OTHER DAY 03/09/20   [name], MD  Lidocaine-Menthol 4-1 % [facility] Apply 1 patch topically daily as needed (pain).     [provider]  Magnesium 500 MG CAPS Take 1 capsule (500 mg total) by mouth in the morning and at bedtime. 03/05/20   Lindell Spar, MD  mirtazapine (REMERON) 7.5 MG tablet Take 1 tablet (7.5 mg total) by mouth at bedtime. 02/25/20   Barton Dubois, MD  montelukast (SINGULAIR) 10 MG tablet Take 1 tablet (10 mg total) by mouth at bedtime. 11/14/19   [name], MD  Multiple Vitamin (MULTIVITAMIN WITH MINERALS) TABS tablet Take 1 tablet by mouth daily.    [provider]  pantoprazole (PROTONIX) 40 MG tablet TAKE 1 TABLET TWICE DAILY 01/07/20   [name], MD  potassium chloride (KLOR-CON 10) 10 MEQ tablet Take 1 tablet (10 mEq total) by mouth daily. 04/17/19   [name], MD  PROAIR RESPICLICK [phone] Base) MCG/ACT AEPB Inhale 2 puffs into the lungs 4 (four) times daily as needed. 08/07/18   Collene Gobble, MD    Allergies    Aspirin  Review of Systems   Review of Systems  Gastrointestinal: Positive for nausea.  Neurological: Positive for tremors.  Psychiatric/Behavioral: The patient is nervous/anxious.   All other systems reviewed and are negative.   Physical Exam Updated Vital Signs BP (!) 174/87   Pulse 78   Temp 98.6 F (37 C) (Oral)   Resp (!) 24   Ht 5\' 8"  (1.727 m)   Wt 60.1 kg   LMP  (LMP Unknown)   SpO2 99%   BMI 20.15 kg/m   Physical Exam Vitals and nursing note reviewed.  Constitutional:       Appearance: She is well-developed. She is diaphoretic.     Comments: Awake, cooperative. Appears anxious but not critically ill or toxic   HENT:     Head: Normocephalic and atraumatic.     Nose: Nose normal.     Mouth/Throat:     Comments: Edentulous. Dry lips, MM tacky  Eyes:     Conjunctiva/sclera: Conjunctivae normal.  Cardiovascular:     Rate and Rhythm: Normal rate and regular rhythm.     Comments: No LE edema. No calf tenderness Pulmonary:     Effort: Pulmonary effort is normal.     Breath sounds: Normal breath sounds.     Comments: Speaking in full sentences. Breathing unlabored  Abdominal:     General: Bowel sounds are normal.     Palpations: Abdomen is soft.     Tenderness: There is no abdominal tenderness.     Comments: No G/R/R. No suprapubic or CVA tenderness. Negative Murphy's and McBurney's. Active BS to lower quadrants.   Musculoskeletal:        General: Normal range of motion.     Cervical back: Normal range of motion.  Skin:    General: Skin is warm.     Capillary Refill: Capillary refill takes less than 2 seconds.  Neurological:     Mental Status: She is alert.     Coordination: Finger-Nose-Finger Test abnormal.     Comments:   Mental Status: Patient is awake, alert, oriented to person, place, year, and situation. Patient is able to give a clear and coherent history. Speech is dysarthric, no aphasia. Edentulous. No signs of neglect.  Cranial Nerves: I not tested II temporal visual fields full bilaterally. PERRL.   III, IV, VI EOMs intact without ptosis V sensation to light touch intact in all 3 divisions of trigeminal nerve bilaterally  VII facial movements symmetric bilaterally VIII hearing intact to voice/conversation  IX, X no uvula deviation, symmetric rise of soft palate/uvula XI 5/5 SCM and trapezius strength bilaterally  XII tongue protrusion midline, symmetric L/R movements  Motor: Strength 5/5 in upper/lower extremities.  Sensation to light  touch intact in face, upper/lower extremities. No pronator drift. No leg drop.  Cerebellar: Ataxic with FTN, but normal HTS. Sits up on her own without truncal sway  Psychiatric:        Attention and Perception: Attention normal.        Mood and Affect: Mood is anxious. Affect is tearful.        Behavior: Behavior normal.     ED Results / Procedures / Treatments   Labs (all labs ordered are listed, but only abnormal results are displayed) Labs Reviewed  CBC WITH DIFFERENTIAL/PLATELET - Abnormal; Notable for the following components:      Result Value   WBC 10.8 (*)    Hemoglobin 10.6 (*)    HCT 32.4 (*)    RDW 16.0 (*)    All other components within normal limits  COMPREHENSIVE METABOLIC PANEL - Abnormal; Notable for the following components:   Potassium 3.2 (*)    CO2 21 (*)    Glucose, Bld 145 (*)    Creatinine, Ser 1.06 (*)    Calcium 8.1 (*)    GFR, Estimated 59 (*)    All other components within normal limits  URINALYSIS, ROUTINE W REFLEX MICROSCOPIC - Abnormal; Notable for the following components:   Color, Urine STRAW (*)    Specific Gravity, Urine 1.003 (*)    Ketones,  ur 5 (*)    All other components within normal limits  TROPONIN I (HIGH SENSITIVITY) - Abnormal; Notable for the following components:   Troponin I (High Sensitivity) 70 (*)    All other components within normal limits  TROPONIN I (HIGH SENSITIVITY) - Abnormal; Notable for the following components:   Troponin I (High Sensitivity) 317 (*)    All other components within normal limits  RESP PANEL BY RT-PCR (FLU A&B, COVID) ARPGX2  LIPASE, BLOOD  TSH  T4, FREE    EKG EKG Interpretation  Date/Time:  Tuesday March 10 2020 14:29:29 EST Ventricular Rate:  90 PR Interval:    QRS Duration: 111 QT Interval:  455 QTC Calculation: 560 R Axis:   103 Text Interpretation: unable to determine rhythm, poor quality Consider RVH w/ secondary repol abnormality Repol abnrm, probable ischemia, lateral leads  Prolonged QT interval Artifact in lead(s) I III aVR aVL aVF V2 Poor data quality Confirmed by Dorie Rank [phone]) on 03/10/2020 2:37:59 PM   Radiology No results found.  Procedures .Critical Care Performed by: Kinnie Feil, PA-C Authorized by: Kinnie Feil, PA-C   Critical care provider statement:    Critical care time (minutes):  45   Critical care was necessary to treat or prevent imminent or life-threatening deterioration of the following conditions:  Cardiac failure (elevated troponin)   Critical care was time spent personally by me on the following activities:  Discussions with consultants, evaluation of patient's response to treatment, examination of patient, ordering and performing treatments and interventions, ordering and review of laboratory studies, ordering and review of radiographic studies, pulse oximetry, re-evaluation of patient's condition, obtaining history from patient or surrogate, review of old charts and development of treatment plan with patient or surrogate   I assumed direction of critical care for this patient from another provider in my specialty: no       Medications Ordered in ED Medications  nicotine (NICODERM CQ - dosed in mg/24 hours) patch 21 mg (21 mg Transdermal Patch Applied 03/10/20 1410)  LORazepam (ATIVAN) injection 1 mg (1 mg Intravenous Not Given 03/10/20 1500)  LORazepam (ATIVAN) injection 1 mg (1 mg Intravenous Given 03/10/20 1425)  lactated ringers bolus 1,000 mL (0 mLs Intravenous Stopped 03/10/20 1518)  ondansetron (ZOFRAN) injection 4 mg (4 mg Intravenous Given 03/10/20 1437)  potassium chloride 10 mEq in 100 mL IVPB (0 mEq Intravenous Stopped 03/10/20 1643)  amLODipine (NORVASC) tablet 5 mg (5 mg Oral Given 03/10/20 1612)  aspirin chewable tablet 324 mg (324 mg Oral Given 03/10/20 1802)    ED Course  I have reviewed the triage vital signs and the nursing notes.  Pertinent labs & imaging results that were available during my care of  the patient were reviewed by me and considered in my medical decision making (see chart for details).  Clinical Course as of 03/10/20 1845  Tue Mar 10, 2020  1516 EKG 12-Lead unable to determine rhythm, poor quality Consider RVH w/ secondary repol abnormality Repol abnrm, probable ischemia, lateral leads Prolonged QT interval Artifact in lead(s) I III aVR aVL aVF V2 Poor data quality Confirmed by Dorie Rank [phone]) on 03/10/2020 2:37:59 PM [CG]  1517 EKG 12-Lead QTc 560, corrected 506-557 [CG]  1518 WBC(!): 10.8 [CG]  1518 Hemoglobin(!): 10.6 [CG]  1518 HCT(!): 32.4 [CG]  1518 Potassium(!): 3.2 [CG]  1518 Glucose(!): 145 [CG]  1518 Creatinine(!): 1.06 [CG]  1518 GFR, Estimated(!): 59 [CG]  1518 TSH: 2.578 [CG]  1535 1. Left ventricular ejection  fraction, by estimation, is 50 to 55%. The left ventricle has low normal function. The left ventricle has no regional wall motion abnormalities. There is mild left ventricular hypertrophy. Left ventricular diastolic parameters are consistent with Grade I diastolic dysfunction (impaired relaxation).  2. Right ventricular systolic function is normal. The right ventricular size is normal. There is normal pulmonary artery systolic pressure.  3. The mitral valve is normal in structure. No evidence of mitral valve regurgitation. No evidence of mitral stenosis.  4. The aortic valve is tricuspid. Aortic valve regurgitation is not visualized. No aortic stenosis is present.  5. The inferior vena cava is normal in size with greater than 50%  respiratory variability, suggesting right atrial pressure of 3 mmHg.  [CG]  1545 Re-evaluated patient. Asleep. HR <100. Tremors significantly improved. Worse with action. Resolved nausea, repeat EKG improved in HR by persistent deep TWI inferior/lateral leads. No CP.  [CG]  1744 Troponin I (High Sensitivity)(!!): 317 [CG]    Clinical Course User Index [CG] Kinnie Feil, PA-C   MDM Rules/Calculators/A&P                            65 y.o. yo with chief complaint of nervousness, shakiness, nausea and dry heaving.  She is in the process of quitting tobacco use.  Previous medical records available, triage and nursing notes reviewed to obtain more history and assist with MDM  Additional information obtained from medical record review, EMS  Chief complain involves an extensive number of treatment options and is a complaint that carries with it a high risk of complications and morbidity and mortality.    Differential diagnosis: Patient definitely appears anxious, tearful.  Reports recent death of her brother.  Panic attack very possible.  Tobacco cessation could be exacerbating this.  On my exam she is diaphoretic and dry heaving.  Will obtain screening labs.  History of hypothyroidism, will check thyroid panel.  EKG.  At this time she is not giving localizing symptoms to raise suspicion for other organic cause.  Denies fever, upper respiratory infection symptoms, chest pain, shortness of breath, abdominal pain, dysuria.  Denies SI, HI, hallucinations.  History of alcohol abuse many years ago, has since stopped.  Medications ordered - ativan 1 mg, 1 L IVF, nicotine patch  ER lab work and imaging ordered by triage RN and me, as above  I have personally visualized and interpreted ER diagnostic work up including labs and imaging.    Labs reveal - K 3.2. glucose 145, normal Na and AG. WBC 10.8. Hgb 10.6, stable anemia. TSH normal. Patient continues to be CP free. UA without infection. LFT, lipase normal.  Imaging reveals - EKG shows TWI deeper in V4-6, and new in II, III, avF compared to previous.  Given this, troponins were added. Trop 70 > 317.  She has not had any CP.   Ordered continuous cardiac and pulse ox monitoring.  Will plan for serial re-examinations. Close monitoring.   1750: Re-evaluated the patient several times. Feels much better, no nausea or vomiting. Still a little tremulous. Will add  respiratory panel, CXR given trending up troponins with EKG changes although she has no CP, SOB, cough. She is tremulous vs ?rigors.   Cause of elevated troponin unclear at this time. ?demand. Patient has been hypertensive in ER highest SBP 186. Given PO amlodipine and last SBP improved 151. It looks patient recently discharged and had soft Bps so BP meds were  stopped at that time.   Will discuss with medicine service. Will request admission for elevated troponins ?demand ischemia from GI illness vs hypertensive emergency.  Recent social stressors and tobacco cessation could be contributing? Had echo recently but never had stress test/cath, etc. She continues to deny ETOH use or illicit drug use.     Will give aspirin, discuss heparin with admitting provider.   1840: Spoke to Dr Denton Brick who will admit patient, requests cardiology consult. Spoke to Dr Oval Linsey - cardiology will see tomorrow. Does not recommend transfer or heparin.   Final Clinical Impression(s) / ED Diagnoses Final diagnoses:  Elevated troponin  Tremulousness    Rx / DC Orders ED Discharge Orders    None       Kinnie Feil, PA-C 03/10/20 1841    Kinnie Feil, PA-C 03/10/20 1845    Dorie Rank, MD 03/12/20 1027

## 2020-03-10 NOTE — ED Notes (Signed)
Pt unable to urinate at this time.  

## 2020-03-10 NOTE — ED Notes (Signed)
Pt ambulated to bathroom without difficulty.

## 2020-03-10 NOTE — ED Notes (Signed)
Patient transported to X-ray 

## 2020-03-10 NOTE — ED Notes (Signed)
Pt placed on pure wick at this time.  

## 2020-03-10 NOTE — ED Notes (Signed)
Date and time results received: 03/10/20 1945 (use smartphrase ".now" to insert current time)  Test: magnesium Critical Value: 0.3  Name of Provider Notified: Dr Denton Brick  Orders Received? Or Actions Taken?: Actions Taken: no orders received

## 2020-03-10 NOTE — ED Notes (Signed)
I assumed care of patient at this time and introduced self to patient. Pt denied any needs at this time.  Son called to check in on patient. Son was updated, with patient's permission. He was then transferred to phone in patient's room so that she could speak with him.

## 2020-03-10 NOTE — Telephone Encounter (Signed)
Kelly Douglas went to see for Home Care-- Pt very nervous\shakey\sweaty, elev bp  --will take to [place] with 911 call   Verbal order  2 w 4 1 w 3

## 2020-03-10 NOTE — ED Notes (Signed)
Pt assisted to bedpan. Tolerated well.

## 2020-03-10 NOTE — ED Triage Notes (Signed)
Pt here via [facility], was seen by @ home PT today who called EMS due to shakiness & nervousness, uncontrollable shaking. Hx of anxiety. Pt trying to quit smoking & has not has cigg in 2 days. Pt states sx began yesterday for unknown causes. Recent renal failure dx.

## 2020-03-10 NOTE — Telephone Encounter (Signed)
Verbal order given  

## 2020-03-10 NOTE — ED Notes (Signed)
ED Provider at bedside. 

## 2020-03-10 NOTE — H&P (Addendum)
History and Physical    Kelly Douglas LGX:[id number] DOB: 11/27/1955 DOA: 03/10/2020  PCP: [name], MD   Patient coming from: Home  I have personally briefly reviewed patient's old medical records in [place]  Chief Complaint:   HPI: Kelly Douglas is a 65 y.o. female with medical history significant for scleroderma, hypertension, depression and anxiety, COPD. Patient presented to the ED with complaints of nervousness and uncontrollable shaking.  Patient was seen by her home physical therapist today who called EMS.  Patient tells me she came is from her nerves, and this is not the 1st time this is happening.  Patient reports nausea and dry heaving without actual vomiting that started last night.  She reports today she was sweating a lot and this lasted about 2 to 3 hours.  She denies chest pain or difficulty breathing.  No family history of heart disease. Patient reports she normally smokes half a pack of cigarettes daily, but after she was discharged from the hospital she started trying to cut down to 5 cigarettes over the past few days until yesterday and then 1 cigarette today.  She believes cigarettes helps with the nervousness/shaking.  Hospitalization 2/6-2/8 for acute kidney injury resolved with fluid resuscitation.  Norvasc was discontinued on discharge as blood pressures blood pressure was soft.  Troponin during hospitalization peaked at 332, which was thought secondary to ongoing dehydration and acute renal failure. Also had hypomagnesemia of 0.7 which was repleted.  Patient is unvaccinated.  She denies alcohol intake, and has not drunk any alcoholic beverage in several months.  ED Course: Temperature 98.6.  Heart rate 70s to 80s, respiratory rate 18-27 with pressure systolic 814G to 818H.  O2 sats greater than 95% on room air.  Troponin 70 >> 317.  EKG shows new T wave inversions in leads II, III, aVF, and more pronounced T wave inversions in V4 through V6. 1  L bolus given.  Aspirin 324 mg given, nicotine patch given. EDP talked to cardiologist-Dr. Oval Linsey with abnormal EKG findings and increasing troponin, okay with admission here, no need for heparin drip at this time, will see tomorrow.  Review of Systems: As per HPI all other systems reviewed and negative.  Past Medical History:  Diagnosis Date  . Anxiety   . Arteriosclerotic cardiovascular disease (ASCVD) 2013   coronary calcification-left main, LAD and CX; small pericardial effusion  . Arthritis    hips  . Cancer ([facility] [facility])    Lung Cancer   . Chronic back pain   . Chronic bronchitis   . Chronic lung disease    Chronic scarring and volume loss-left lung; characteristics of a chronic infectious process-possible MAI  . Depression   . Dyspnea    occasional   . GERD (gastroesophageal reflux disease)    Dr Gala Romney EGD 09/2009->esophagitis, sm HH, antral erosions, atonic esophagus  . Hyperlipidemia   . Hypertension   . Hypothyroid 1981 approx  . Scleroderma ([place])   . Seasonal allergies   . Syncope    Multiple spells over the past 40+ years, likely neurocardiogenic  . Tobacco abuse 06/24/2009    Past Surgical History:  Procedure Laterality Date  . BILATERAL SALPINGOOPHORECTOMY  2013    Dr. Elonda Husky; uterus remains in situ  . BRONCHIAL BIOPSY  11/21/2019   Procedure: BRONCHIAL BIOPSIES;  Surgeon: Collene Gobble, MD;  Location: [facility] ENDOSCOPY;  Service: Pulmonary;;  . BRONCHIAL BRUSHINGS  11/21/2019   Procedure: BRONCHIAL BRUSHINGS;  Surgeon: Collene Gobble, MD;  Location: [facility] ENDOSCOPY;  Service: Pulmonary;;  . BRONCHIAL NEEDLE ASPIRATION BIOPSY  11/21/2019   Procedure: BRONCHIAL NEEDLE ASPIRATION BIOPSIES;  Surgeon: Collene Gobble, MD;  Location: [facility] ENDOSCOPY;  Service: Pulmonary;;  . BRONCHIAL WASHINGS  11/21/2019   Procedure: BRONCHIAL WASHINGS;  Surgeon: Collene Gobble, MD;  Location: [facility] ENDOSCOPY;  Service: Pulmonary;;  . COLONOSCOPY  09/28/09   anal papilla otherwise normal  . COLONOSCOPY  N/A 08/29/2012   OFB:PZWCHEN polyp-removed as described above. tubular adenoma  . COLONOSCOPY WITH PROPOFOL N/A 11/11/2019   Procedure: COLONOSCOPY WITH PROPOFOL;  Surgeon: Daneil Dolin, MD;  Location: [facility] ENDO SUITE;  Service: Endoscopy;  Laterality: N/A;  2:30pm  . ESOPHAGOGASTRODUODENOSCOPY  10/07/09   Dr. Barrie Dunker of esophageal mucosa, diffusely ?esophagitis (bx benign), small HH/antral erosions, erythema bx benign. 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 ESOPHAGOGASTRODUODENOSCOPY (EGD) WITH ESOPHAGEAL DILATION N/A 03/07/2012   IDP:OEUMPNTI, patent, tubular esophagus of uncertain significance-status post biopsy )unremarkable). Hiatal hernia  . FUDUCIAL PLACEMENT  10/18/2017   Procedure: PLACEMENT OF 3 FUDUCIAL INTO LEFT LOWER LOBE OF LUNG-TARGET 1;  Surgeon: Collene Gobble, MD;  Location: [place];  Service: Thoracic;;  . LAPAROSCOPIC APPENDECTOMY  07/16/2011   Procedure: APPENDECTOMY LAPAROSCOPIC;  Surgeon: Donato Heinz, MD;  Location: [facility] ORS;  Service: General;  Laterality: N/A;  . POLYPECTOMY  11/11/2019   Procedure: POLYPECTOMY;  Surgeon: Daneil Dolin, MD;  Location: [facility] ENDO SUITE;  Service: Endoscopy;;  . TUBAL LIGATION    . VIDEO BRONCHOSCOPY  10/18/2017  . VIDEO BRONCHOSCOPY WITH ENDOBRONCHIAL NAVIGATION N/A 10/18/2017   Procedure: VIDEO BRONCHOSCOPY WITH ENDOBRONCHIAL NAVIGATION;  Surgeon: Collene Gobble, MD;  Location: [facility] OR;  Service: Thoracic;  Laterality: N/A;  . VIDEO BRONCHOSCOPY WITH ENDOBRONCHIAL NAVIGATION Left 11/21/2019   Procedure: VIDEO BRONCHOSCOPY WITH ENDOBRONCHIAL NAVIGATION;  Surgeon: Collene Gobble, MD;  Location: [place] ENDOSCOPY;  Service: Pulmonary;  Laterality: Left;     reports that she has been smoking cigarettes. She started smoking about 48 years ago. She has a 22.50 pack-year smoking history. She has never used smokeless tobacco. She reports previous alcohol use. She reports that she does not use drugs.  Allergies  Allergen Reactions  .  Aspirin Other (See Comments)    Nose bleeds    Family History  Problem Relation Age of Onset  . Stroke Father   . Aneurysm Father   . Coronary artery disease Brother   . Hypertension Brother   . Down syndrome Brother   . Hypertension Brother   . Hypertension Brother        father/mother  . Aneurysm Mother   . Throat cancer Brother 49       throat cancer  . Hypertension Sister   . Diabetes Brother   . Diabetes Brother   . Anesthesia problems Neg Hx   . Hypotension Neg Hx   . Malignant hyperthermia Neg Hx   . Pseudochol deficiency Neg Hx   . Colon cancer Neg Hx     Prior to Admission medications   Medication Sig Start Date End Date Taking? Authorizing Provider  acetaminophen (TYLENOL) 650 MG CR tablet Take 1,300 mg by mouth every 8 (eight) hours as needed for pain.     [provider]  atorvastatin (LIPITOR) 10 MG tablet Take 1 tablet (10 mg total) by mouth daily. 10/16/19   [name], MD  Cholecalciferol (VITAMIN D) 125 MCG (5000 UT) CAPS Take 5,000 Units by mouth daily.     [provider]  DULoxetine (CYMBALTA) 60 MG capsule TAKE 1 CAPSULE EVERY DAY 01/07/20   [name], MD  guaiFENesin-dextromethorphan (ROBITUSSIN DM) 100-10 MG/5ML syrup Take 5 mLs by mouth every 4 (four) hours as needed for cough. 12/12/19   Swayze, Ava, DO  hydrOXYzine (VISTARIL) 50 MG capsule TAKE 1 TO 2 CAPSULES BY MOUTH AT BEDTIME FOR SLEEP 03/10/20   [name], MD  levothyroxine (SYNTHROID) 75 MCG tablet TAKE 1 TABLET EVERY OTHER DAY ALTERNATING WITH 1/2 TABLET EVERY OTHER DAY 03/09/20   [name], MD  Lidocaine-Menthol 4-1 % [facility] Apply 1 patch topically daily as needed (pain).     [provider]  Magnesium 500 MG CAPS Take 1 capsule (500 mg total) by mouth in the morning and at bedtime. 03/05/20   Lindell Spar, MD  mirtazapine (REMERON) 7.5 MG tablet Take 1 tablet (7.5 mg total) by mouth at bedtime. 02/25/20   Barton Dubois, MD   montelukast (SINGULAIR) 10 MG tablet Take 1 tablet (10 mg total) by mouth at bedtime. 11/14/19   [name], MD  Multiple Vitamin (MULTIVITAMIN WITH MINERALS) TABS tablet Take 1 tablet by mouth daily.    [provider]  pantoprazole (PROTONIX) 40 MG tablet TAKE 1 TABLET TWICE DAILY 01/07/20   [name], MD  potassium chloride (KLOR-CON 10) 10 MEQ tablet Take 1 tablet (10 mEq total) by mouth daily. 04/17/19   [name], MD  PROAIR RESPICLICK 268 [phone] Base) MCG/ACT AEPB Inhale 2 puffs into the lungs 4 (four) times daily as needed. 08/07/18   Collene Gobble, MD    Physical Exam: Vitals:   03/10/20 1630 03/10/20 1730 03/10/20 1800 03/10/20 1830  BP: (!) 163/79 (!) 151/99 (!) 165/87 (!) 174/87  Pulse: 95 87 77 78  Resp:  18 (!) 21 (!) 24  Temp:      TempSrc:      SpO2: 100% 100% 97% 99%  Weight:      Height:        Constitutional: Tremulousness involving mostly her hands, and chills when speaking.  When distracted tremulousness ceases.  And patient appears calm. Vitals:   03/10/20 1630 03/10/20 1730 03/10/20 1800 03/10/20 1830  BP: (!) 163/79 (!) 151/99 (!) 165/87 (!) 174/87  Pulse: 95 87 77 78  Resp:  18 (!) 21 (!) 24  Temp:      TempSrc:      SpO2: 100% 100% 97% 99%  Weight:      Height:       Eyes: PERRL, lids and conjunctivae normal ENMT: Mucous membranes are moist.  Neck: normal, supple, no masses, no thyromegaly Respiratory: clear to auscultation bilaterally, no wheezing, no crackles. Normal respiratory effort. No accessory muscle use.  Cardiovascular: Regular rate and rhythm, no murmurs / rubs / gallops. No extremity edema. 2+ pedal pulses.  Abdomen: no tenderness, no masses palpated. No hepatosplenomegaly. Bowel sounds positive.  Musculoskeletal: no clubbing / cyanosis. No joint deformity upper and lower extremities. Good ROM, no contractures. Normal muscle tone.  Skin: no rashes, lesions, ulcers. No induration Neurologic: No apparent  cranial nerve abnormality , moving extremities spontaneously. Psychiatric: Normal judgment and insight. Alert and oriented x 3. Normal mood.   Labs on Admission: I have personally reviewed following labs and imaging studies  CBC: Recent Labs  Lab 03/04/20 1157 03/10/20 1408  WBC 10.2 10.8*  NEUTROABS  --  6.5  HGB 10.6* 10.6*  HCT 31.9* 32.4*  MCV 79 80.8  PLT 380 365  Basic Metabolic Panel: Recent Labs  Lab 03/04/20 1159 03/10/20 1408  NA 143 137  K 3.8 3.2*  CL 103 102  CO2 22 21*  GLUCOSE 81 145*  BUN 11 9  CREATININE 1.14* 1.06*  CALCIUM 8.7 8.1*  MG 0.5*  --    Liver Function Tests: Recent Labs  Lab 03/10/20 1408  AST 23  ALT 13  ALKPHOS 48  BILITOT 0.9  PROT 6.5  ALBUMIN 3.6   Recent Labs  Lab 03/10/20 1408  LIPASE 24   Thyroid Function Tests: Recent Labs    03/10/20 1408  TSH 2.578   Urine analysis:    Component Value Date/Time   COLORURINE STRAW (A) 03/10/2020 1707   APPEARANCEUR CLEAR 03/10/2020 1707   LABSPEC 1.003 (L) 03/10/2020 1707   PHURINE 7.0 03/10/2020 1707   GLUCOSEU NEGATIVE 03/10/2020 1707   HGBUR NEGATIVE 03/10/2020 1707   BILIRUBINUR NEGATIVE 03/10/2020 1707   BILIRUBINUR negative 12/19/2017 1549   KETONESUR 5 (A) 03/10/2020 1707   PROTEINUR NEGATIVE 03/10/2020 1707   UROBILINOGEN 2.0 (A) 12/19/2017 1549   UROBILINOGEN 0.2 12/15/2013 1335   NITRITE NEGATIVE 03/10/2020 1707   LEUKOCYTESUR NEGATIVE 03/10/2020 1707    Radiological Exams on Admission: No results found.  EKG: Independently reviewed.  Sinus rhythm rate 74.  New quite pronounced T wave inversions in leads II, III, aVF, inversions in leads V5 and V6 are significantly more pronounced today.  Assessment/Plan Principal Problem:   Hypomagnesemia Active Problems:   Elevated troponin   Anxiety and depression   Hypothyroidism   HTN (hypertension)   Adenocarcinoma of left lung, stage 1 (HCC)  Severe hypomagnesemia- 0.3. Appears to be a recent recurring  issue for patient. Hypomagnesemia may be due to pantoprazole -40mg  bid, which may impair absorption of mag. She is not on diuretics, appetite okay, no recent GI losses. Potassium is 3.2. Calcium is mildly low at 8.1. Denies alcohol use.  -Replete magnesium -She is on chronic magnesium supplementation started 2/17 by outpatient provider -Hold Protonix , discontinuing on discharge -Oral Calcium carbonate x 2.  Elevated troponin- 70 > 317.  EKG with T wave changes in inferior-lateral leads.  No chest pain, no difficulty breathing.  Dry heaving and diaphoresis today. Blood pressure elevation up to 180s.  Abnormal EKG done in ED due to dry heaving, prompted subsequent troponin check. Severe hypomagnesemia of 0.3, may be etiology for patient's abnormal EKG, elevated troponin. But she has risk factors for CAD- with tobacco use, hypertension and age. -Troponin elevated on recent hospitalization, peaked at 322, echo 02/24/20-EF 50 -55% , G1DD, mild LVH, no regional wall motion abnormalities. -Trend troponin -EKG in the morning -Aspirin 325 mg daily -Lipid panel in a.m. -Resume statin at increased dose 40 units daily -Cardiology consultation in a.m. -UDS  Tremulousness -likely secondary to severe hypomagnesemia. Possible component of uncontrolled anxiety and nicotine withdrawal.TSH  WNL  at 2.5. -Replete magnesium -As needed 0.5 mg Ativan  Prolonged QTC-548,. Likely secondary to severe hypomagnesemia and hypokalemia.  Home medications include Cymbalta. -Replete K  Hypertension-elevated, systolic 101B to 510C.  Amlodipine 5mg  was discontinued on recent hospitalization due to soft blood pressure, patient at that time was dehydrated with acute kidney injury. -Resume Norvasc5mg  - PRN Labetalol for systolic > 585  Hypothyroidism -Resume Synthroid  Tobacco abuse-cutting back, smoked half pack of cigarettes daily. -Nicotine patch  Hx of Lung cancer-stage I adenocarcinoma. Follows with pulmonologist  Dr. Lamonte Sakai, and oncologist Dr. Delton Coombes. Per notes plans for referral for surgical resection.  Depression -Patient does not know the name of the medication she takes, pending med reconciliation resume Cymbalta, Remeron.   DVT prophylaxis: lovenox Code Status: Full code Family Communication: None at bedside Disposition Plan: ~ 1- 2 days Consults called: Cardiology Admission status:  Obs tele   Bethena Roys MD [facility]  03/10/2020, 6:42 PM

## 2020-03-10 NOTE — ED Notes (Signed)
Date and time results received: 03/10/20 & 1742Hrs  (use smartphrase ".now" to insert current time)  Test: troponin  Critical Value: 317  Name of Provider Notified: EDP  Orders Received? Or Actions Taken?: Notified

## 2020-03-11 LAB — BASIC METABOLIC PANEL
Anion gap: 7 (ref 5–15)
BUN: 7 mg/dL — ABNORMAL LOW (ref 8–23)
CO2: 29 mmol/L (ref 22–32)
Calcium: 9 mg/dL (ref 8.9–10.3)
Chloride: 106 mmol/L (ref 98–111)
Creatinine, Ser: 0.91 mg/dL (ref 0.44–1.00)
GFR, Estimated: >60 mL/min (ref >60)
Glucose, Bld: 112 mg/dL — ABNORMAL HIGH (ref 70–99)
Potassium: 4.7 mmol/L (ref 3.5–5.1)
Sodium: 142 mmol/L (ref 135–145)

## 2020-03-11 LAB — COMPREHENSIVE METABOLIC PANEL
ALT: 15 U/L (ref 0–44)
AST: 21 U/L (ref 15–41)
Albumin: 3.8 g/dL (ref 3.5–5.0)
Alkaline Phosphatase: 55 U/L (ref 38–126)
Anion gap: 8 (ref 5–15)
BUN: 5 mg/dL — ABNORMAL LOW (ref 8–23)
CO2: 27 mmol/L (ref 22–32)
Calcium: 9.3 mg/dL (ref 8.9–10.3)
Chloride: 106 mmol/L (ref 98–111)
Creatinine, Ser: 0.91 mg/dL (ref 0.44–1.00)
GFR, Estimated: >60 mL/min (ref >60)
Glucose, Bld: 109 mg/dL — ABNORMAL HIGH (ref 70–99)
Potassium: 4.1 mmol/L (ref 3.5–5.1)
Sodium: 141 mmol/L (ref 135–145)
Total Bilirubin: 0.5 mg/dL (ref 0.3–1.2)
Total Protein: 6.5 g/dL (ref 6.5–8.1)

## 2020-03-11 LAB — MAGNESIUM
Magnesium: 0.9 mg/dL — CL (ref 1.7–2.4)
Magnesium: 0.8 mg/dL — CL (ref 1.7–2.4)
Magnesium: 2.3 mg/dL (ref 1.7–2.4)

## 2020-03-11 MED ORDER — AMLODIPINE BESYLATE 5 MG PO TABS
5.0000 mg | ORAL_TABLET | Freq: Every day | ORAL | Status: DC
  Administered 2020-03-11 – 2020-03-12 (×2): 5 mg via ORAL
  Filled 2020-03-11 (×2): qty 1

## 2020-03-11 MED ORDER — ENSURE ENLIVE PO LIQD
237.0000 mL | Freq: Two times a day (BID) | ORAL | Status: DC
  Administered 2020-03-11: 237 mL via ORAL

## 2020-03-11 MED ORDER — POLYETHYLENE GLYCOL 3350 17 G PO PACK: 17.0000 g | PACK | Freq: Every day | ORAL | Status: DC | PRN

## 2020-03-11 MED ORDER — ATORVASTATIN CALCIUM 10 MG PO TABS
10.0000 mg | ORAL_TABLET | Freq: Every day | ORAL | Status: DC
  Administered 2020-03-11 – 2020-03-12 (×2): 10 mg via ORAL
  Filled 2020-03-11 (×2): qty 1

## 2020-03-11 MED ORDER — ADULT MULTIVITAMIN W/MINERALS CH
1.0000 | ORAL_TABLET | Freq: Every day | ORAL | Status: DC
  Administered 2020-03-11 – 2020-03-12 (×2): 1 via ORAL
  Filled 2020-03-11 (×2): qty 1

## 2020-03-11 MED ORDER — FAMOTIDINE 20 MG PO TABS
20.0000 mg | ORAL_TABLET | Freq: Every day | ORAL | Status: DC
  Administered 2020-03-11 – 2020-03-12 (×2): 20 mg via ORAL
  Filled 2020-03-11 (×2): qty 1

## 2020-03-11 MED ORDER — LORAZEPAM 0.5 MG PO TABS
0.5000 mg | ORAL_TABLET | Freq: Once | ORAL | Status: AC
  Administered 2020-03-11: 0.5 mg via ORAL
  Filled 2020-03-11: qty 1

## 2020-03-11 MED ORDER — ONDANSETRON HCL 4 MG PO TABS: 4.0000 mg | ORAL_TABLET | Freq: Four times a day (QID) | ORAL | Status: DC | PRN

## 2020-03-11 MED ORDER — ACETAMINOPHEN 325 MG PO TABS
650.0000 mg | ORAL_TABLET | Freq: Four times a day (QID) | ORAL | Status: DC | PRN
  Administered 2020-03-11 – 2020-03-12 (×3): 650 mg via ORAL
  Filled 2020-03-11 (×3): qty 2

## 2020-03-11 MED ORDER — MAGNESIUM OXIDE 400 (241.3 MG) MG PO TABS
400.0000 mg | ORAL_TABLET | Freq: Two times a day (BID) | ORAL | Status: DC
  Administered 2020-03-11 – 2020-03-12 (×3): 400 mg via ORAL
  Filled 2020-03-11 (×3): qty 1

## 2020-03-11 MED ORDER — MAGNESIUM SULFATE 2 GM/50ML IV SOLN
2.0000 g | Freq: Once | INTRAVENOUS | Status: AC
  Administered 2020-03-11: 2 g via INTRAVENOUS
  Filled 2020-03-11: qty 50

## 2020-03-11 MED ORDER — LABETALOL HCL 5 MG/ML IV SOLN: 10.0000 mg | INTRAVENOUS | Status: DC | PRN

## 2020-03-11 MED ORDER — ENOXAPARIN SODIUM 40 MG/0.4ML ~~LOC~~ SOLN
40.0000 mg | SUBCUTANEOUS | Status: DC
  Administered 2020-03-11 – 2020-03-12 (×2): 40 mg via SUBCUTANEOUS
  Filled 2020-03-11 (×2): qty 0.4

## 2020-03-11 MED ORDER — ONDANSETRON HCL 4 MG/2ML IJ SOLN: 4.0000 mg | Freq: Four times a day (QID) | INTRAMUSCULAR | Status: DC | PRN

## 2020-03-11 MED ORDER — LORAZEPAM 0.5 MG PO TABS
0.5000 mg | ORAL_TABLET | Freq: Four times a day (QID) | ORAL | Status: DC | PRN
  Administered 2020-03-11: 0.5 mg via ORAL

## 2020-03-11 MED ORDER — ACETAMINOPHEN 650 MG RE SUPP: 650.0000 mg | Freq: Four times a day (QID) | RECTAL | Status: DC | PRN

## 2020-03-11 NOTE — Progress Notes (Signed)
[facility] Progress Note  Patient: Kelly Douglas    TIR:[id number]  DOA: 03/10/2020     Date of Service: the patient was seen and examined on 03/11/2020  Brief hospital course: Surgical history of scleroderma, HTN, depression, anxiety, COPD.  Now presents with complaints of vomiting, difficulty ambulation and generalized tremors.  Found to have severe hypomagnesemia and elevated troponin. Currently plan is continue to replace potassium and magnesium and monitor on telemetry.  Assessment and Plan: 1.  Severe hypomagnesemia. Hypokalemia Prolonged QTC Likely associated with 1 episode of vomiting that she had yesterday. Potassium level corrected. Magnesium level remains severely low. We will continue to replace with IV magnesium sulfate. Monitor for stabilization. Also increase oral magnesium therapy. This is acute on chronic and recurrent in nature.  Likely associated with PPI which he takes twice daily. We will change to Pepcid.  2.  Elevated troponin Etiology not clear.  No significant ST-T wave changes to suggest ischemia. No chest pain or angina equivalent symptoms. Troponin trending down. Continue with aspirin. At present no further work-up. Recent echocardiogram showed preserved EF with diastolic dysfunction without any wall motion abnormality, this was done secondary to elevated troponin as well. Patient will benefit from outpatient cardiology consultation for stress test.  3.  Tremors. Associated with mild ischemia. Continue as needed Ativan.  4.  Essential hypertension Blood pressure mildly elevated. Continue Norvasc.  5.  Hypothyroidism Continue Synthroid.  6. Tobacco abuse-cutting back, smoked half pack of cigarettes daily. -Nicotine patch  7Hx of Lung cancer-stage I adenocarcinoma. Follows with pulmonologist Dr. Lamonte Sakai, and oncologist Dr. Delton Coombes. Per notes plans for referral for surgical resection.  8 Depression -Patient does not know the name  of the medication she takes, pending med reconciliation resume Cymbalta, Remeron  9.  Malnutrition. Mild.  In the setting of cancer diagnosis. We will add protein supplements. Body mass index is 19.41 kg/m.   Diet: Regular diet DVT Prophylaxis:   enoxaparin (LOVENOX) injection 40 mg Start: 03/11/20 1000   Advance goals of care discussion: Full code  Family Communication: no family was present at bedside, at the time of interview.   Disposition:  Status is: Observation  The patient will require care spanning > 2 midnights and should be moved to inpatient because: Needs to correct the magnesium level then need monitoring for stabilization of the magnesium level to avoid readmission.  Dispo:  Patient From: Home  Planned Disposition: Home with Health Care Svc  Expected discharge date: 03/13/2020  Medically stable for discharge: No       Subjective: Minimal oral intake.  Some nausea no vomiting.  No fever no chills.  Reports chronic abdominal pain.  Passing gas.  Reports chronic diarrhea with 3 bowel movements in a day. Reports that she is trying to drink 40 ounces of water on a daily basis nowadays.  Physical Exam:  General: Appear in mild distress, no Rash; Oral Mucosa Clear, moist. no Abnormal Neck Mass Or lumps, Conjunctiva normal  Cardiovascular: S1 and S2 Present, no Murmur, Respiratory: good respiratory effort, Bilateral Air entry present and CTA, no Crackles, no wheezes Abdomen: Bowel Sound present, Soft and mild tenderness Extremities: no Pedal edema Neurology: alert and oriented to time, place, and person affect appropriate. no new focal deficit Gait not checked due to patient safety concerns  Vitals:   03/11/20 0207 03/11/20 0602 03/11/20 1032 03/11/20 1442  BP: 107/63 117/66 (!) 118/92 115/70  Pulse: 93 96 (!) 103 90  Resp: 19 19 17  19  Temp: 98.5 F (36.9 C) 98.3 F (36.8 C) 98.7 F (37.1 C) 97.9 F (36.6 C)  TempSrc: Oral Oral Oral Oral  SpO2: 100% 100%  100% 100%  Weight:      Height:        Intake/Output Summary (Last 24 hours) at 03/11/2020 1554 Last data filed at 03/11/2020 1500 Gross per 24 hour  Intake 191.03 ml  Output 300 ml  Net -108.97 ml   Filed Weights   03/10/20 1338 03/10/20 2123  Weight: 60.1 kg 57.9 kg    Data Reviewed: I have personally reviewed and interpreted daily labs, tele strips, imaging. I reviewed all nursing notes, pharmacy notes, vitals, pertinent old records I have discussed plan of care as described above with RN and patient/family.  CBC: Recent Labs  Lab 03/10/20 1408  WBC 10.8*  NEUTROABS 6.5  HGB 10.6*  HCT 32.4*  MCV 80.8  PLT 270   Basic Metabolic Panel: Recent Labs  Lab 03/10/20 1408 03/10/20 1643 03/10/20 2110 03/11/20 0451 03/11/20 1045  NA 137  --   --  142  --   K 3.2*  --   --  4.7  --   CL 102  --   --  106  --   CO2 21*  --   --  29  --   GLUCOSE 145*  --   --  112*  --   BUN 9  --   --  7*  --   CREATININE 1.06*  --   --  0.91  --   CALCIUM 8.1*  --   --  9.0  --   MG  --  0.3* 1.9 0.9* 0.8*    Studies: DG Chest 2 View  Result Date: 03/10/2020 CLINICAL DATA:  Rigors, elevated troponin shakiness and nervousness. EXAM: CHEST - 2 VIEW COMPARISON:  CT chest from July of 2021, chest x-ray from February 24, 2020 FINDINGS: Image rotated to the LEFT. Cardiac and mediastinal contours and hilar structures are stable accounting for this finding. Stable appearance of volume loss in the LEFT hemithorax and fiducial markers at the LEFT lung base with increased density at the LEFT lung apex in this patient with history of lung cancer. No new sign of lobar consolidation or pleural effusion. On limited assessment no acute skeletal process. IMPRESSION: Stable appearance of the chest. No acute abnormality. Electronically Signed   By: Zetta Bills M.D.   On: 03/10/2020 19:26    Scheduled Meds: . amLODipine  5 mg Oral Daily  . atorvastatin  10 mg Oral Daily  . enoxaparin (LOVENOX)  injection  40 mg Subcutaneous Q24H  . famotidine  20 mg Oral Daily  . feeding supplement  237 mL Oral BID BM  . magnesium oxide  400 mg Oral BID  . multivitamin with minerals  1 tablet Oral Daily   Continuous Infusions: PRN Meds: acetaminophen **OR** acetaminophen, labetalol, LORazepam, ondansetron **OR** ondansetron (ZOFRAN) IV, polyethylene glycol  Time spent: 35 minutes  Author: Berle Mull, MD [facility] 03/11/2020 3:54 PM  To reach On-call, see care teams to locate the attending and reach out via [email or web address]. Between 7PM-7AM, please contact night-coverage If you still have difficulty reaching the attending provider, please page the [facility] (Director on Call) for [facility] on amion for assistance.

## 2020-03-11 NOTE — Progress Notes (Signed)
Date and time results received: 03/11/20 1130 (use smartphrase ".now" to insert current time)  Test: magnesium Critical Value: 0.8  Name of Provider Notified: Dr. Posey Pronto  Orders Received? Or Actions Taken?: Dose of Mag to be given now and one later.

## 2020-03-11 NOTE — Evaluation (Signed)
Physical Therapy Evaluation Patient Details Name: Kelly Douglas MRN: [id number] DOB: 1955/06/28 Today's Date: 03/11/2020   History of Present Illness  Kelly Douglas is a 65 y.o. female with medical history significant for scleroderma, hypertension, depression and anxiety, COPD.  Patient presented to the ED with complaints of nervousness and uncontrollable shaking.  Patient was seen by her home physical therapist today who called EMS.  Patient tells me she came is from her nerves, and this is not the 1st time this is happening.  Patient reports nausea and dry heaving without actual vomiting that started last night.  She reports today she was sweating a lot and this lasted about 2 to 3 hours.  She denies chest pain or difficulty breathing.  No family history of heart disease.  Patient reports she normally smokes half a pack of cigarettes daily, but after she was discharged from the hospital she started trying to cut down to 5 cigarettes over the past few days until yesterday and then 1 cigarette today.  She believes cigarettes helps with the nervousness/shaking.    Clinical Impression  Patient functioning at baseline for functional mobility and gait.  Plan:  Patient discharged from physical therapy to care of nursing for ambulation daily as tolerated for length of stay.     Follow Up Recommendations No PT follow up    Equipment Recommendations  Other (comment) (Rollator walker with seat and breaks)    Recommendations for Other Services       Precautions / Restrictions Precautions Precautions: Fall Restrictions Weight Bearing Restrictions: No      Mobility  Bed Mobility Overal bed mobility: Modified Independent                  Transfers Overall transfer level: Modified independent                  Ambulation/Gait Ambulation/Gait assistance: Modified independent (Device/Increase time) Gait Distance (Feet): 200 Feet Assistive device: None Gait  Pattern/deviations: Decreased step length - right;Decreased step length - left;Decreased stride length Gait velocity: decreased   General Gait Details: good return for ambulation in hallway with occasional leaning on side rails for support, no loss of balance  Stairs            Wheelchair Mobility    Modified Rankin (Stroke Patients Only)       Balance Overall balance assessment: Mild deficits observed, not formally tested Sitting-balance support: Feet supported;No upper extremity supported Sitting balance-Leahy Scale: Good     Standing balance support: During functional activity;No upper extremity supported Standing balance-Leahy Scale: Fair Standing balance comment: occasional leaning on side rails                             Pertinent Vitals/Pain Pain Assessment: No/denies pain    Home Living Family/patient expects to be discharged to:: Private residence Living Arrangements: Spouse/significant other;Other relatives Available Help at Discharge: Family;Available 24 hours/day Type of Home: House Home Access: Stairs to enter Entrance Stairs-Rails: Right;Left;Can reach both Entrance Stairs-Number of Steps: 2 Home Layout: One level Home Equipment: Walker - standard      Prior Function Level of Independence: Independent         Comments: house hold and short distanced community ambulator     Hand Dominance   Dominant Hand: Right    Extremity/Trunk Assessment   Upper Extremity Assessment Upper Extremity Assessment: Overall WFL for tasks assessed    Lower Extremity  Assessment Lower Extremity Assessment: Overall WFL for tasks assessed    Cervical / Trunk Assessment Cervical / Trunk Assessment: Normal  Communication   Communication: No difficulties  Cognition Arousal/Alertness: Awake/alert Behavior During Therapy: WFL for tasks assessed/performed Overall Cognitive Status: Within Functional Limits for tasks assessed                                         General Comments      Exercises     Assessment/Plan    PT Assessment Patent does not need any further PT services  PT Problem List         PT Treatment Interventions      PT Goals (Current goals can be found in the Care Plan section)  Acute Rehab PT Goals Patient Stated Goal: return home with family to assist PT Goal Formulation: With patient Time For Goal Achievement: 03/11/20 Potential to Achieve Goals: Good    Frequency     Barriers to discharge        Co-evaluation               AM-PAC PT "6 Clicks" Mobility  Outcome Measure Help needed turning from your back to your side while in a flat bed without using bedrails?: None Help needed moving from lying on your back to sitting on the side of a flat bed without using bedrails?: None Help needed moving to and from a bed to a chair (including a wheelchair)?: None Help needed standing up from a chair using your arms (e.g., wheelchair or bedside chair)?: None Help needed to walk in hospital room?: A Little Help needed climbing 3-5 steps with a railing? : A Little 6 Click Score: 22    End of Session   Activity Tolerance: Patient tolerated treatment well Patient left: in bed;with call bell/phone within reach Nurse Communication: Mobility status PT Visit Diagnosis: Unsteadiness on feet (R26.81);Other abnormalities of gait and mobility (R26.89);Muscle weakness (generalized) (M62.81)    Time: 6294-7654 PT Time Calculation (min) (ACUTE ONLY): 20 min   Charges:   PT Evaluation $PT Eval Moderate Complexity: 1 Mod PT Treatments $Therapeutic Activity: 8-22 mins        3:49 PM, 03/11/20 Lonell Grandchild, MPT Physical Therapist with [facility] [phone] office [phone] mobile phone

## 2020-03-11 NOTE — Care Management Obs Status (Signed)
[place] NOTIFICATION   Patient Details  Name: Kelly Douglas MRN: [id number] Date of Birth: March 07, 1955   Medicare Observation Status Notification Given:  Yes    Tommy Medal 03/11/2020, 4:03 PM

## 2020-03-12 ENCOUNTER — Encounter: Payer: Medicare HMO | Admitting: Family Medicine

## 2020-03-12 DIAGNOSIS — R531 Weakness: Secondary | ICD-10-CM | POA: Diagnosis not present

## 2020-03-12 LAB — CBC
HCT: 29 % — ABNORMAL LOW (ref 36.0–46.0)
Hemoglobin: 9.2 g/dL — ABNORMAL LOW (ref 12.0–15.0)
MCH: 25.7 pg — ABNORMAL LOW (ref 26.0–34.0)
MCHC: 31.7 g/dL (ref 30.0–36.0)
MCV: 81 fL (ref 80.0–100.0)
Platelets: 339 10*3/uL (ref 150–400)
RBC: 3.58 MIL/uL — ABNORMAL LOW (ref 3.87–5.11)
RDW: 15.9 % — ABNORMAL HIGH (ref 11.5–15.5)
WBC: 8.6 10*3/uL (ref 4.0–10.5)
nRBC: 0 % (ref 0.0–0.2)

## 2020-03-12 LAB — COMPREHENSIVE METABOLIC PANEL
ALT: 16 U/L (ref 0–44)
AST: 20 U/L (ref 15–41)
Albumin: 3.3 g/dL — ABNORMAL LOW (ref 3.5–5.0)
Alkaline Phosphatase: 52 U/L (ref 38–126)
Anion gap: 11 (ref 5–15)
BUN: 5 mg/dL — ABNORMAL LOW (ref 8–23)
CO2: 27 mmol/L (ref 22–32)
Calcium: 9.1 mg/dL (ref 8.9–10.3)
Chloride: 103 mmol/L (ref 98–111)
Creatinine, Ser: 0.87 mg/dL (ref 0.44–1.00)
GFR, Estimated: >60 mL/min (ref >60)
Glucose, Bld: 97 mg/dL (ref 70–99)
Potassium: 3.9 mmol/L (ref 3.5–5.1)
Sodium: 141 mmol/L (ref 135–145)
Total Bilirubin: 0.4 mg/dL (ref 0.3–1.2)
Total Protein: 5.8 g/dL — ABNORMAL LOW (ref 6.5–8.1)

## 2020-03-12 LAB — MAGNESIUM
Magnesium: 1.6 mg/dL — ABNORMAL LOW (ref 1.7–2.4)
Magnesium: 2.3 mg/dL (ref 1.7–2.4)

## 2020-03-12 MED ORDER — AMLODIPINE BESYLATE 5 MG PO TABS: 5.0000 mg | ORAL_TABLET | Freq: Every day | ORAL | 0 refills | Status: DC

## 2020-03-12 MED ORDER — ENSURE ENLIVE PO LIQD: 237.0000 mL | Freq: Two times a day (BID) | ORAL | 0 refills | Status: DC

## 2020-03-12 MED ORDER — MAGNESIUM OXIDE 400 (241.3 MG) MG PO TABS: 400.0000 mg | ORAL_TABLET | Freq: Two times a day (BID) | ORAL | 0 refills | Status: DC

## 2020-03-12 MED ORDER — MAGNESIUM SULFATE 2 GM/50ML IV SOLN
2.0000 g | Freq: Once | INTRAVENOUS | Status: AC
  Administered 2020-03-12: 2 g via INTRAVENOUS
  Filled 2020-03-12: qty 50

## 2020-03-12 MED ORDER — POLYETHYLENE GLYCOL 3350 17 G PO PACK: 17.0000 g | PACK | Freq: Every day | ORAL | 0 refills | Status: DC | PRN

## 2020-03-12 MED ORDER — FAMOTIDINE 20 MG PO TABS: 20.0000 mg | ORAL_TABLET | Freq: Every day | ORAL | 0 refills | Status: DC

## 2020-03-12 NOTE — Discharge Summary (Signed)
[facility] Discharge Summary   Patient: Kelly Douglas MOQ:[id number]  PCP: [name], MD  Date of admission: 03/10/2020   Date of discharge:  03/12/2020     Discharge Diagnoses:  Principal Problem:   Hypomagnesemia Active Problems:   Anxiety and depression   Hypothyroidism   HTN (hypertension)   Adenocarcinoma of left lung, stage 1 (HCC)   Elevated troponin   Admitted From: home Disposition:  Home   Recommendations for Outpatient Follow-up:  1. PCP: Follow-up with PCP in 1 week 2. Follow up LABS/TEST: Repeat BMP and magnesium   Follow-up Information    [name], MD. Schedule an appointment as soon as possible for a visit in 1 week(s).   Specialty: Family Medicine Why: Repeat BMP and magnesium in 1 week Contact information: [address], [address] [place] [address] [phone]              Discharge Instructions    Diet - low sodium heart healthy   Complete by: As directed    Increase activity slowly   Complete by: As directed       Diet recommendation: Regular diet  Activity: The patient is advised to gradually reintroduce usual activities, as tolerated  Discharge Condition: stable  Code Status: Full code   History of present illness: As per the H and P dictated on admission, "Kelly Douglas is a 65 y.o. female with medical history significant for scleroderma, hypertension, depression and anxiety, COPD. Patient presented to the ED with complaints of nervousness and uncontrollable shaking.  Patient was seen by her home physical therapist today who called EMS.  Patient tells me she came is from her nerves, and this is not the 1st time this is happening.  Patient reports nausea and dry heaving without actual vomiting that started last night.  She reports today she was sweating a lot and this lasted about 2 to 3 hours.  She denies chest pain or difficulty breathing.  No family history of heart disease. Patient reports she  normally smokes half a pack of cigarettes daily, but after she was discharged from the hospital she started trying to cut down to 5 cigarettes over the past few days until yesterday and then 1 cigarette today.  She believes cigarettes helps with the nervousness/shaking.  Hospitalization 2/6-2/8 for acute kidney injury resolved with fluid resuscitation.  Norvasc was discontinued on discharge as blood pressures blood pressure was soft.  Troponin during hospitalization peaked at 332, which was thought secondary to ongoing dehydration and acute renal failure. Also had hypomagnesemia of 0.7 which was repleted.  Patient is unvaccinated.  She denies alcohol intake, and has not drunk any alcoholic beverage in several months."  Hospital Course:  Summary of her active problems in the hospital is as following.  1.  Severe hypomagnesemia. Hypokalemia Prolonged QTC Likely associated with 1 episode of vomiting that she had yesterday. Potassium level corrected. Magnesium level remains severely low. Aggressively replaced with IV magnesium sulfate. Also increase oral magnesium therapy. This is acute on chronic and recurrent in nature.  Likely associated with PPI which she takes twice daily. We will change to Pepcid.  2.  Elevated troponin Etiology not clear.  No significant ST-T wave changes to suggest ischemia. No chest pain or angina equivalent symptoms. Troponin trending down. Continue with aspirin. At present no further work-up. Recent echocardiogram showed preserved EF with diastolic dysfunction without any wall motion abnormality, this was done secondary to elevated troponin as well.  3.  Tremors. Associated with hypomagnesemia  continue as needed Ativan.  4.  Essential hypertension Blood pressure mildly elevated. Continue Norvasc.  5.  Hypothyroidism Continue Synthroid.  6. Tobacco abuse-cutting back, smokedhalf pack of cigarettes daily. -Nicotine patch  7Hx of Lung  cancer-stage I adenocarcinoma.Follows with pulmonologist Dr. Eloy End oncologist Dr. Delton Coombes. Per notes plans for referral for surgical resection.  8 Depression No suicidal ideation -Patient does not know the name of the medication she takes,  Resuming home medication.  9.   Unspecified malnutrition. Likely mild.  In the setting of cancer diagnosis. Continue protein supplements. Body mass index is 19.41 kg/m.   Patient was seen by physical therapy, who recommended No therapy needed on discharge. On the day of the discharge the patient's vitals were stable, and no other acute medical condition were reported by patient. The patient was felt safe to be discharge at Home with no therapy needed on discharge.  Consultants: none Procedures: none  DISCHARGE MEDICATION: Allergies as of 03/12/2020      Reactions   Aspirin Other (See Comments)   Nose bleeds      Medication List    STOP taking these medications   Magnesium 500 MG Caps Replaced by: magnesium oxide 400 (241.3 Mg) MG tablet   pantoprazole 40 MG tablet Commonly known as: PROTONIX   potassium chloride 10 MEQ tablet Commonly known as: Klor-Con 10     TAKE these medications   acetaminophen 650 MG CR tablet Commonly known as: TYLENOL Take 1,300 mg by mouth every 8 (eight) hours as needed for pain.   amLODipine 5 MG tablet Commonly known as: NORVASC Take 1 tablet (5 mg total) by mouth daily. Start taking on: March 13, 2020   atorvastatin 10 MG tablet Commonly known as: LIPITOR Take 1 tablet (10 mg total) by mouth daily.   DULoxetine 60 MG capsule Commonly known as: CYMBALTA TAKE 1 CAPSULE EVERY DAY   famotidine 20 MG tablet Commonly known as: PEPCID Take 1 tablet (20 mg total) by mouth daily. Start taking on: March 13, 2020   feeding supplement Liqd Take 237 mLs by mouth 2 (two) times daily between meals.   guaiFENesin-dextromethorphan 100-10 MG/5ML syrup Commonly known as: ROBITUSSIN DM Take  5 mLs by mouth every 4 (four) hours as needed for cough.   hydrOXYzine 50 MG capsule Commonly known as: VISTARIL TAKE 1 TO 2 CAPSULES BY MOUTH AT BEDTIME FOR SLEEP   levothyroxine 75 MCG tablet Commonly known as: SYNTHROID TAKE 1 TABLET EVERY OTHER DAY ALTERNATING WITH 1/2 TABLET EVERY OTHER DAY   Lidocaine-Menthol 4-1 % Ptch Apply 1 patch topically daily as needed (pain).   magnesium oxide 400 (241.3 Mg) MG tablet Commonly known as: MAG-OX Take 1 tablet (400 mg total) by mouth 2 (two) times daily. Replaces: Magnesium 500 MG Caps   mirtazapine 7.5 MG tablet Commonly known as: REMERON Take 1 tablet (7.5 mg total) by mouth at bedtime.   montelukast 10 MG tablet Commonly known as: SINGULAIR Take 1 tablet (10 mg total) by mouth at bedtime.   multivitamin with minerals Tabs tablet Take 1 tablet by mouth daily.   polyethylene glycol 17 g packet Commonly known as: MIRALAX / GLYCOLAX Take 17 g by mouth daily as needed for mild constipation.   ProAir RespiClick 062 (90 Base) MCG/ACT Aepb Generic drug: Albuterol Sulfate Inhale 2 puffs into the lungs 4 (four) times daily as needed.   Vitamin D 125 MCG (5000 UT) Caps Take 5,000 Units by mouth daily.  Durable Medical Equipment  (From admission, onward)         Start     Ordered   03/12/20 1209  For home use only DME 4 wheeled rolling walker with seat  Once       Question:  Patient needs a walker to treat with the following condition  Answer:  Physical deconditioning   03/12/20 1208          Discharge Exam: Filed Weights   03/10/20 1338 03/10/20 2123  Weight: 60.1 kg 57.9 kg   Vitals:   03/12/20 0601 03/12/20 1352  BP: 118/69 136/83  Pulse: 83 79  Resp: 18 18  Temp: 98.2 F (36.8 C) (!) 97.4 F (36.3 C)  SpO2: 100% 99%   General: Appear in mild distress, no Rash; Oral Mucosa Clear, moist. no Abnormal Neck Mass Or lumps, Conjunctiva normal  Cardiovascular: S1 and S2 Present, no  Murmur, Respiratory: good respiratory effort, Bilateral Air entry present and CTA, no Crackles, no wheezes Abdomen: Bowel Sound present, Soft and no tenderness Extremities: no Pedal edema Neurology: alert and oriented to time, place, and person affect appropriate. no new focal deficit Gait not checked due to patient safety concerns    The results of significant diagnostics from this hospitalization (including imaging, microbiology, ancillary and laboratory) are listed below for reference.    Significant Diagnostic Studies: CT Abdomen Pelvis Wo Contrast  Result Date: 02/23/2020 CLINICAL DATA:  Generalized weakness x3 weeks, right lower quadrant pain x1 week. Positive for nausea vomiting and diarrhea 1 week ago. History of hypertension, lung cancer and appendectomy. EXAM: CT ABDOMEN AND PELVIS WITHOUT CONTRAST TECHNIQUE: Multidetector CT imaging of the abdomen and pelvis was performed following the standard protocol without IV contrast. COMPARISON:  PET-CT December 09, 2019 and CT abdomen and pelvis March 28, 2018. FINDINGS: Lower chest: No acute abnormality. Suspected fiducial marker in the left lung base. Hepatobiliary: Unremarkable noncontrast appearance of the hepatic parenchyma. Gallbladder is is distended without associated inflammatory findings. No biliary ductal dilatation. Pancreas: Unremarkable. No pancreatic ductal dilatation or surrounding inflammatory changes. Spleen: Normal in size without focal abnormality. Adrenals/Urinary Tract: Adrenal glands are unremarkable. Kidneys are normal, without renal calculi, focal lesion, or hydronephrosis. Bladder is unremarkable. Stomach/Bowel: Stomach is within normal limits. Duodenal diverticulum. Appendix is surgically absent by report. No evidence of bowel wall thickening, distention, or inflammatory changes. Vascular/Lymphatic: Aortic atherosclerosis. No enlarged abdominal or pelvic lymph nodes. Reproductive: Uterus and bilateral adnexa are unremarkable.  Other: No abdominopelvic ascites. Musculoskeletal: No acute or significant osseous findings. Multilevel degenerative changes spine. IMPRESSION: 1. No acute abnormality in the abdomen or pelvis. 2. Gallbladder is distended without associated inflammatory findings, commonly seen in fasting state. 3. Aortic atherosclerosis. Aortic Atherosclerosis (ICD10-I70.0). Electronically Signed   By: Dahlia Bailiff MD   On: 02/23/2020 20:09   DG Chest 2 View  Result Date: 03/10/2020 CLINICAL DATA:  Rigors, elevated troponin shakiness and nervousness. EXAM: CHEST - 2 VIEW COMPARISON:  CT chest from July of 2021, chest x-ray from February 24, 2020 FINDINGS: Image rotated to the LEFT. Cardiac and mediastinal contours and hilar structures are stable accounting for this finding. Stable appearance of volume loss in the LEFT hemithorax and fiducial markers at the LEFT lung base with increased density at the LEFT lung apex in this patient with history of lung cancer. No new sign of lobar consolidation or pleural effusion. On limited assessment no acute skeletal process. IMPRESSION: Stable appearance of the chest. No acute abnormality. Electronically Signed  By: Zetta Bills M.D.   On: 03/10/2020 19:26   Portable chest 1 View  Result Date: 02/24/2020 CLINICAL DATA:  Lymphocytosis with generalized weakness. EXAM: PORTABLE CHEST 1 VIEW COMPARISON:  To 6 point FINDINGS: Stable volume loss left hemithorax with leftward shift of cardiomediastinal anatomy. Right lung is hyperexpanded but clear. Similar appearance of chronic airspace disease/scarring in the left apex. Fiducial markers with retrocardiac pulmonary nodule again noted left base. The visualized bony structures of the thorax show no acute abnormality. Telemetry leads overlie the chest. IMPRESSION: Stable exam. No acute cardiopulmonary findings. Volume loss left hemithorax with chronic airspace disease/scarring in the left apex and fiducial markers with nodular density noted in  the retrocardiac left base. Electronically Signed   By: Misty Stanley M.D.   On: 02/24/2020 05:42   DG Chest Port 1 View  Result Date: 02/23/2020 CLINICAL DATA:  Generalized weakness. EXAM: PORTABLE CHEST 1 VIEW COMPARISON:  December 31, 2019 FINDINGS: Tortuosity of the aorta. Cardiomediastinal silhouette is normal. Mediastinal contours appear intact. Increased attenuation of the left lung apex and nodular opacity in the left lower lobe are stable. No significant airspace consolidation. Osseous structures are without acute abnormality. Soft tissues are grossly normal. IMPRESSION: 1. Stable increased attenuation of the left lung apex and pulmonary nodule in the left lower lobe. 2. No significant airspace consolidation. Electronically Signed   By: Fidela Salisbury M.D.   On: 02/23/2020 16:17   ECHOCARDIOGRAM COMPLETE  Result Date: 02/24/2020    ECHOCARDIOGRAM REPORT   Patient Name:   BLAYRE PAPANIA Date of Exam: 02/24/2020 Medical Rec #:  [id number]        Height:       68.0 in Accession #:    [id number]       Weight:       129.6 lb Date of Birth:  1955-10-07        BSA:          1.700 m Patient Age:    64 years         BP:           98/68 mmHg Patient Gender: F                HR:           85 bpm. Exam Location:  [facility] Procedure: 2D Echo, Cardiac Doppler and Color Doppler Indications:    Elevated Troponin  History:        Patient has prior history of Echocardiogram examinations, most                 recent 09/05/2017. Signs/Symptoms:Syncope; Risk Factors:Current                 Smoker, Hypertension and Dyslipidemia. Lung cancer.  Sonographer:    Dustin Flock RDCS Referring Phys: [id number] ASIA B [place]  1. Left ventricular ejection fraction, by estimation, is 50 to 55%. The left ventricle has low normal function. The left ventricle has no regional wall motion abnormalities. There is mild left ventricular hypertrophy. Left ventricular diastolic parameters are consistent with Grade I  diastolic dysfunction (impaired relaxation).  2. Right ventricular systolic function is normal. The right ventricular size is normal. There is normal pulmonary artery systolic pressure.  3. The mitral valve is normal in structure. No evidence of mitral valve regurgitation. No evidence of mitral stenosis.  4. The aortic valve is tricuspid. Aortic valve regurgitation is not visualized. No aortic stenosis is present.  5. The  inferior vena cava is normal in size with greater than 50% respiratory variability, suggesting right atrial pressure of 3 mmHg. FINDINGS  Left Ventricle: Left ventricular ejection fraction, by estimation, is 50 to 55%. The left ventricle has low normal function. The left ventricle has no regional wall motion abnormalities. The left ventricular internal cavity size was normal in size. There is mild left ventricular hypertrophy. Left ventricular diastolic parameters are consistent with Grade I diastolic dysfunction (impaired relaxation). Normal left ventricular filling pressure. Right Ventricle: The right ventricular size is normal. No increase in right ventricular wall thickness. Right ventricular systolic function is normal. There is normal pulmonary artery systolic pressure. The tricuspid regurgitant velocity is 2.85 m/s, and  with an assumed right atrial pressure of 3 mmHg, the estimated right ventricular systolic pressure is 97.9 mmHg. Left Atrium: Left atrial size was normal in size. Right Atrium: Right atrial size was normal in size. Pericardium: There is no evidence of pericardial effusion. Mitral Valve: The mitral valve is normal in structure. No evidence of mitral valve regurgitation. No evidence of mitral valve stenosis. Tricuspid Valve: The tricuspid valve is normal in structure. Tricuspid valve regurgitation is not demonstrated. No evidence of tricuspid stenosis. Aortic Valve: The aortic valve is tricuspid. Aortic valve regurgitation is not visualized. No aortic stenosis is present.  Aortic valve mean gradient measures 3.6 mmHg. Aortic valve peak gradient measures 6.2 mmHg. Aortic valve area, by VTI measures 2.87 cm. Pulmonic Valve: The pulmonic valve was not well visualized. Pulmonic valve regurgitation is not visualized. No evidence of pulmonic stenosis. Aorta: The aortic root is normal in size and structure. Pulmonary Artery: Indeterminant PASP, inadequate TR jet. Venous: The inferior vena cava is normal in size with greater than 50% respiratory variability, suggesting right atrial pressure of 3 mmHg. IAS/Shunts: No atrial level shunt detected by color flow Doppler.  LEFT VENTRICLE PLAX 2D LVIDd:         4.02 cm  Diastology LVIDs:         2.47 cm  LV e' medial:    5.55 cm/s LV PW:         1.18 cm  LV E/e' medial:  7.3 LV IVS:        1.13 cm  LV e' lateral:   7.72 cm/s LVOT diam:     2.20 cm  LV E/e' lateral: 5.3 LV SV:         58 LV SV Index:   34 LVOT Area:     3.80 cm  RIGHT VENTRICLE RV Basal diam:  2.59 cm RV S prime:     10.90 cm/s TAPSE (M-mode): 2.1 cm LEFT ATRIUM             Index       RIGHT ATRIUM          Index LA diam:        2.60 cm 1.53 cm/m  RA Area:     8.78 cm LA Vol (A2C):   24.6 ml 14.47 ml/m RA Volume:   18.00 ml 10.59 ml/m LA Vol (A4C):   17.5 ml 10.30 ml/m LA Biplane Vol: 21.8 ml 12.83 ml/m  AORTIC VALVE AV Area (Vmax):    2.70 cm AV Area (Vmean):   2.46 cm AV Area (VTI):     2.87 cm AV Vmax:           124.88 cm/s AV Vmean:          89.491 cm/s AV VTI:  0.202 m AV Peak Grad:      6.2 mmHg AV Mean Grad:      3.6 mmHg LVOT Vmax:         88.70 cm/s LVOT Vmean:        58.000 cm/s LVOT VTI:          0.152 m LVOT/AV VTI ratio: 0.75  AORTA Ao Root diam: 2.70 cm MITRAL VALVE               TRICUSPID VALVE MV Area (PHT): 9.60 cm    TR Peak grad:   32.5 mmHg MV Decel Time: 79 msec     TR Vmax:        285.00 cm/s MV E velocity: 40.70 cm/s MV A velocity: 68.10 cm/s  SHUNTS MV E/A ratio:  0.60        Systemic VTI:  0.15 m                            Systemic Diam:  2.20 cm Carlyle Dolly MD Electronically signed by Carlyle Dolly MD Signature Date/Time: 02/24/2020/12:46:20 PM    Final     Microbiology: Recent Results (from the past 240 hour(s))  Resp Panel by RT-PCR (Flu A&B, Covid) Nasopharyngeal Swab     Status: None   Collection Time: 03/10/20  6:02 PM   Specimen: Nasopharyngeal Swab; Nasopharyngeal(NP) swabs in vial transport medium  Result Value Ref Range Status   SARS Coronavirus 2 by RT PCR NEGATIVE NEGATIVE Final    Comment: (NOTE) SARS-CoV-2 target nucleic acids are NOT DETECTED.  The SARS-CoV-2 RNA is generally detectable in upper respiratory specimens during the acute phase of infection. The lowest concentration of SARS-CoV-2 viral copies this assay can detect is 138 copies/mL. A negative result does not preclude SARS-Cov-2 infection and should not be used as the sole basis for treatment or other patient management decisions. A negative result may occur with  improper specimen collection/handling, submission of specimen other than nasopharyngeal swab, presence of viral mutation(s) within the areas targeted by this assay, and inadequate number of viral copies(<138 copies/mL). A negative result must be combined with clinical observations, patient history, and epidemiological information. The expected result is Negative.  Fact Sheet for Patients:  EntrepreneurPulse.com.au  Fact Sheet for Healthcare Providers:  IncredibleEmployment.be  This test is no t yet approved or cleared by the [place] FDA and  has been authorized for detection and/or diagnosis of SARS-CoV-2 by FDA under an Emergency Use Authorization (EUA). This EUA will remain  in effect (meaning this test can be used) for the duration of the COVID-19 declaration under Section 564(b)(1) of the Act, 21 U.S.C.section 360bbb-3(b)(1), unless the authorization is terminated  or revoked sooner.       Influenza A by PCR NEGATIVE NEGATIVE  Final   Influenza B by PCR NEGATIVE NEGATIVE Final    Comment: (NOTE) The Xpert Xpress SARS-CoV-2/FLU/RSV plus assay is intended as an aid in the diagnosis of influenza from Nasopharyngeal swab specimens and should not be used as a sole basis for treatment. Nasal washings and aspirates are unacceptable for Xpert Xpress SARS-CoV-2/FLU/RSV testing.  Fact Sheet for Patients: EntrepreneurPulse.com.au  Fact Sheet for Healthcare Providers: IncredibleEmployment.be  This test is not yet approved or cleared by the [place] FDA and has been authorized for detection and/or diagnosis of SARS-CoV-2 by FDA under an Emergency Use Authorization (EUA). This EUA will remain in effect (meaning this test can be used) for  the duration of the COVID-19 declaration under Section 564(b)(1) of the Act, 21 U.S.C. section 360bbb-3(b)(1), unless the authorization is terminated or revoked.  Performed at [facility], [address] [place], [place] [address]      Labs: CBC: Recent Labs  Lab 03/10/20 1408 03/12/20 0450  WBC 10.8* 8.6  NEUTROABS 6.5  --   HGB 10.6* 9.2*  HCT 32.4* 29.0*  MCV 80.8 81.0  PLT 365 578   Basic Metabolic Panel: Recent Labs  Lab 03/10/20 1408 03/10/20 1643 03/11/20 0451 03/11/20 1045 03/11/20 1645 03/12/20 0450 03/12/20 1253  NA 137  --  142  --  141 141  --   K 3.2*  --  4.7  --  4.1 3.9  --   CL 102  --  106  --  106 103  --   CO2 21*  --  29  --  27 27  --   GLUCOSE 145*  --  112*  --  109* 97  --   BUN 9  --  7*  --  5* 5*  --   CREATININE 1.06*  --  0.91  --  0.91 0.87  --   CALCIUM 8.1*  --  9.0  --  9.3 9.1  --   MG  --    < > 0.9* 0.8* 2.3 1.6* 2.3   < > = values in this interval not displayed.   Liver Function Tests: Recent Labs  Lab 03/10/20 1408 03/11/20 1645 03/12/20 0450  AST 23 21 20   ALT 13 15 16   ALKPHOS 48 55 52  BILITOT 0.9 0.5 0.4  PROT 6.5 6.5 5.8*  ALBUMIN 3.6 3.8 3.3*   CBG: No results  for input(s): GLUCAP in the last 168 hours.  Time spent: 35 minutes  Signed:  Berle Mull  [facility]  03/12/2020 2:51 PM

## 2020-03-12 NOTE — Plan of Care (Signed)

## 2020-03-12 NOTE — TOC Transition Note (Signed)
Transition of Care [facility]) - CM/SW Discharge Note   Patient Details  Name: Kelly Douglas MRN: [id number] Date of Birth: [birth year]  Transition [facility]) CM/SW Contact:  Shade Flood, LCSW Phone Number: 03/12/2020, 1:55 PM   Clinical Narrative:      Pt stable for dc. PT recommended Rollator and MD ordered. Referred to [employer] and item was delivered to pt's room. There are no other TOC needs for dc.   Barriers to Discharge: Barriers Resolved   Patient Goals and CMS Choice   CMS Medicare.gov Compare Post Acute Care list provided to:: Patient Choice offered to / list presented to : Patient  Discharge Placement                       Discharge Plan and Services                DME Arranged: Walker rolling with seat DME Agency: [facility] Date DME Agency Contacted: 03/12/20   Representative spoke with at DME Agency: [place] ([place]) Interventions     Readmission Risk Interventions No flowsheet data found.

## 2020-03-13 ENCOUNTER — Telehealth: Payer: Self-pay

## 2020-03-13 NOTE — Telephone Encounter (Signed)
Transition Care Management Unsuccessful Follow-up Telephone Call  Date of discharge and from where:  03/12/20 from [facility]   Attempts:  1st Attempt  Reason for unsuccessful TCM follow-up call:  Left voice message

## 2020-03-16 ENCOUNTER — Telehealth: Payer: Self-pay

## 2020-03-16 DIAGNOSIS — I10 Essential (primary) hypertension: Secondary | ICD-10-CM | POA: Diagnosis not present

## 2020-03-16 DIAGNOSIS — I2510 Atherosclerotic heart disease of native coronary artery without angina pectoris: Secondary | ICD-10-CM | POA: Diagnosis not present

## 2020-03-16 DIAGNOSIS — C3490 Malignant neoplasm of unspecified part of unspecified bronchus or lung: Secondary | ICD-10-CM | POA: Diagnosis not present

## 2020-03-16 DIAGNOSIS — F419 Anxiety disorder, unspecified: Secondary | ICD-10-CM | POA: Diagnosis not present

## 2020-03-16 DIAGNOSIS — M349 Systemic sclerosis, unspecified: Secondary | ICD-10-CM | POA: Diagnosis not present

## 2020-03-16 DIAGNOSIS — J42 Unspecified chronic bronchitis: Secondary | ICD-10-CM | POA: Diagnosis not present

## 2020-03-16 DIAGNOSIS — N179 Acute kidney failure, unspecified: Secondary | ICD-10-CM | POA: Diagnosis not present

## 2020-03-16 DIAGNOSIS — E871 Hypo-osmolality and hyponatremia: Secondary | ICD-10-CM | POA: Diagnosis not present

## 2020-03-16 DIAGNOSIS — F32A Depression, unspecified: Secondary | ICD-10-CM | POA: Diagnosis not present

## 2020-03-16 NOTE — Telephone Encounter (Signed)
Transition Care Management Follow-up Telephone Call  Date of discharge and from where: 03/12/20 from [facility]   How have you been since you were released from the hospital? Pt still anxious at times  Any questions or concerns? Yes, wants to know what she can do to keep her potassium and magnesium levels normal.   Items Reviewed:  Did the pt receive and understand the discharge instructions provided? Yes   Medications obtained and verified? Yes   Other? Yes   Any new allergies since your discharge? No   Dietary orders reviewed? No  Do you have support at home? Yes   Home Care and Equipment/Supplies: Were home health services ordered? Yes If so, what is the name of the agency? [facility]  Has the agency set up a time to come to the patient's home? yes Were any new equipment or medical supplies ordered?  No What is the name of the medical supply agency? N/A  Were you able to get the supplies/equipment? not applicable Do you have any questions related to the use of the equipment or supplies? No  Functional Questionnaire: (I = Independent and D = Dependent) ADLs: I   Bathing/Dressing- I   Meal Prep- I  Eating-  I   Maintaining continence- I  Transferring/Ambulation-  I   Managing Meds- I   Follow up appointments reviewed:   PCP Hospital f/u appt confirmed? Yes  Scheduled to see Dr. Posey Pronto on 03/26/20 @ 11:00am.  [facility] f/u appt confirmed? No .   Are transportation arrangements needed? No   If their condition worsens, is the pt aware to call PCP or go to the Emergency Dept.? Yes  Was the patient provided with contact information for the PCP's office or ED? Yes  Was to pt encouraged to call back with questions or concerns? Yes

## 2020-03-16 NOTE — Telephone Encounter (Signed)
Home Health Nurse called stating that pt needed to get an appt since being discharged from the hospital. Informed him that we have been trying to get in touch with her to do a TOC call and to get that appt set up. She is going to stay by her phone. Will you call please.

## 2020-03-16 NOTE — Telephone Encounter (Signed)
Scheduled

## 2020-03-16 NOTE — Telephone Encounter (Signed)
This is the third attempt and the last day to contact pt for [facility] call. Dr. Moshe Cipro would like me to fwd these messages to continue calling the pt to reach out to still get follow up in place if possible even though after today it will no longer be considered TOC.

## 2020-03-16 NOTE — Telephone Encounter (Signed)
Transition Care Management Unsuccessful Follow-up Telephone Call  Date of discharge and from where: 03/12/20 from [facility]   Attempts:  2nd Attempt  Reason for unsuccessful TCM follow-up call:  Left voice message, called mobile and home.

## 2020-03-17 ENCOUNTER — Ambulatory Visit (HOSPITAL_COMMUNITY): Payer: Medicare HMO | Admitting: Hematology

## 2020-03-17 IMAGING — CT CT CHEST SUPER D W/O CM
2 of 4 series · 15 of 36 positions shown, 18 images · non-contrast
Comparison: CT chest 10/31/2016; CT chest 05/08/2015; PET-CT
11/17/2016

CLINICAL DATA: Patient with history of lung nodule left lower lobe.
Follow-up evaluation.

EXAM:
CT CHEST WITHOUT CONTRAST
TECHNIQUE: Multidetector CT imaging of the chest was performed using thin slice
collimation for electromagnetic bronchoscopy planning purposes,
without intravenous contrast.

[Series 3: thorax · axial · 0.67mm/px · z∈[-190,+98]mm · 12 of 169 slices shown, 15 images]
[im 13/169  mediastinal]
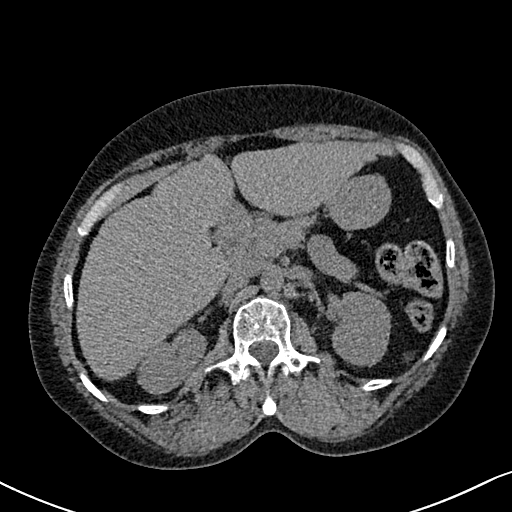
[im 13/169  lung]
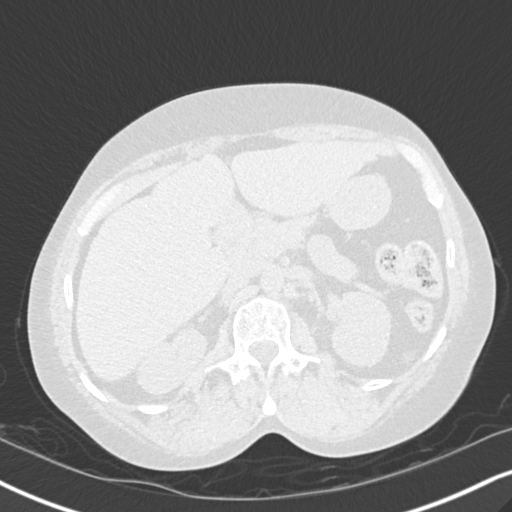
[im 25/169  lung]
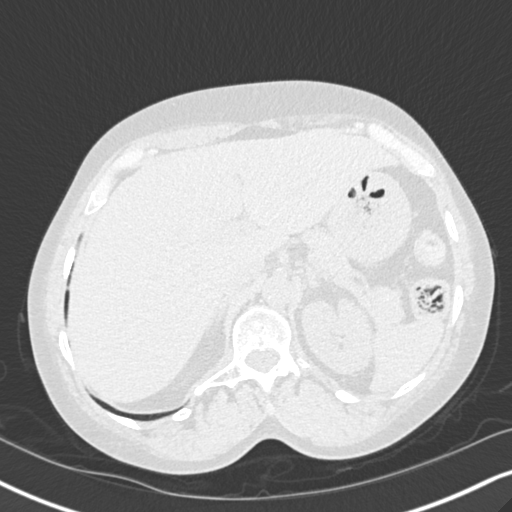
[im 37/169  lung]
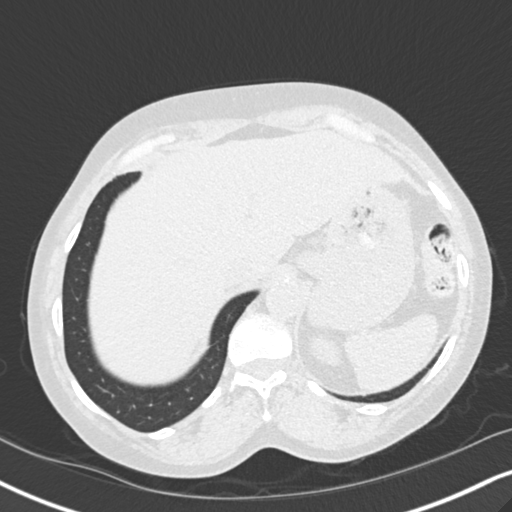
[im 49/169  lung]
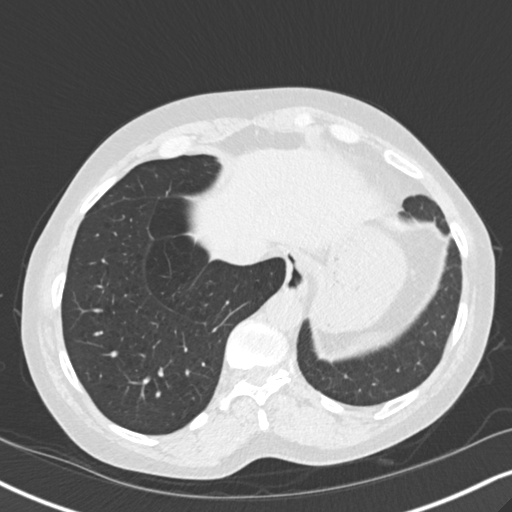
[im 61/169  mediastinal]
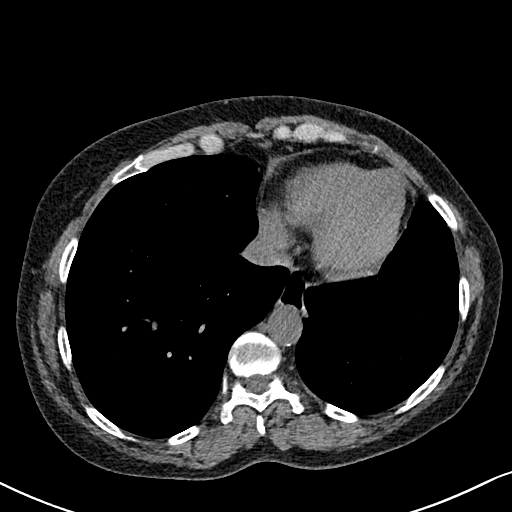
[im 61/169  lung]
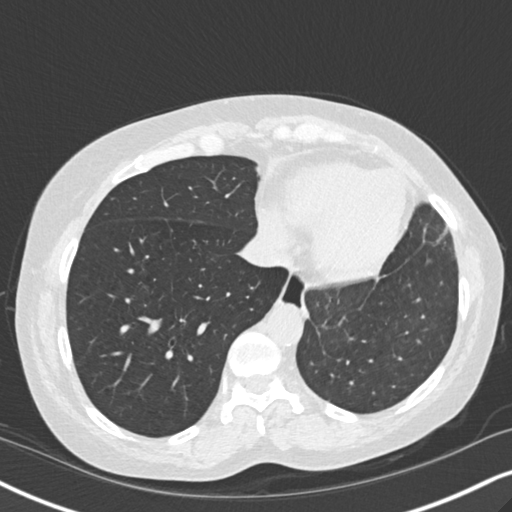
[im 73/169  lung]
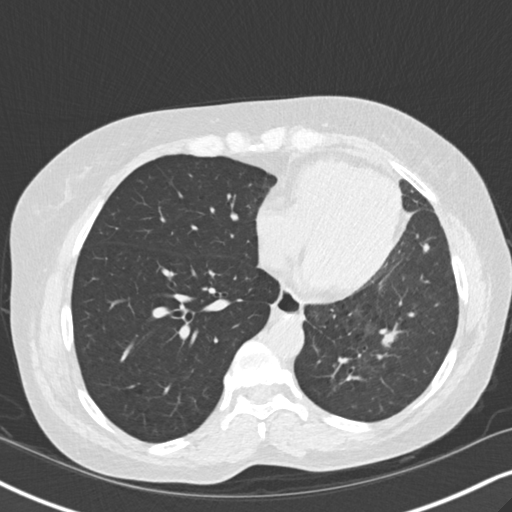
[im 97/169  lung]
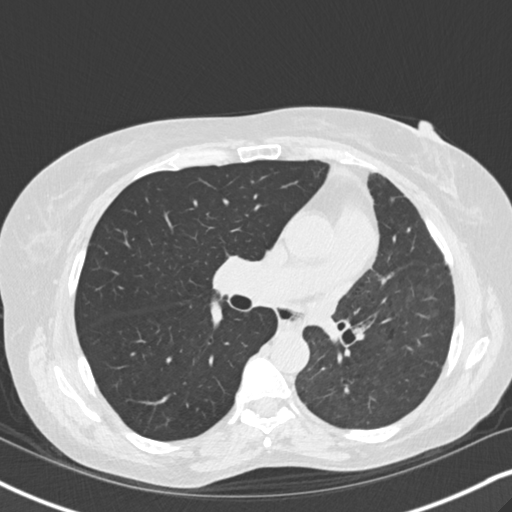
[im 109/169  lung]
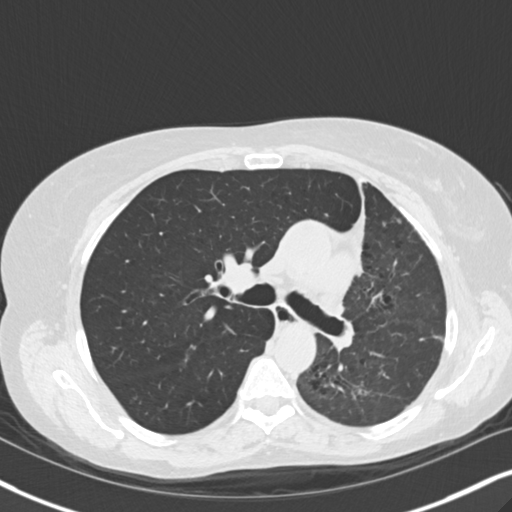
[im 121/169  mediastinal]
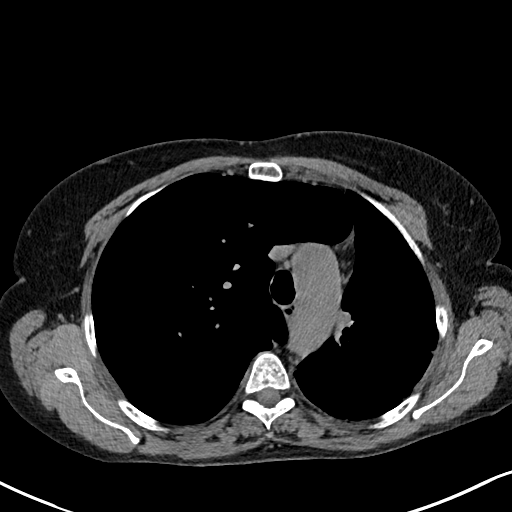
[im 121/169  lung]
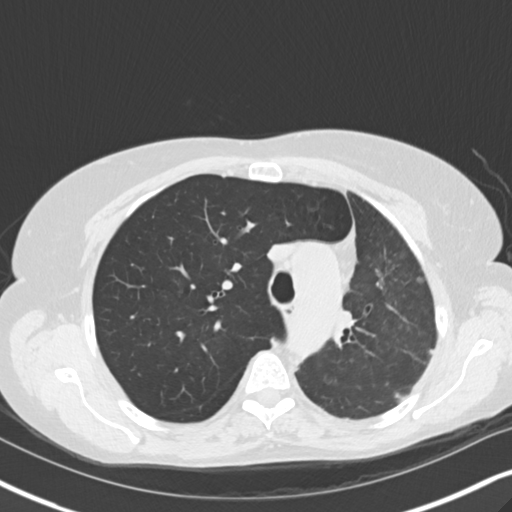
[im 133/169  lung]
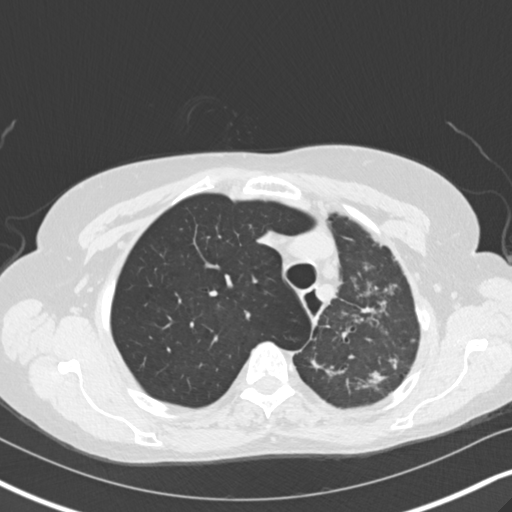
[im 145/169  lung]
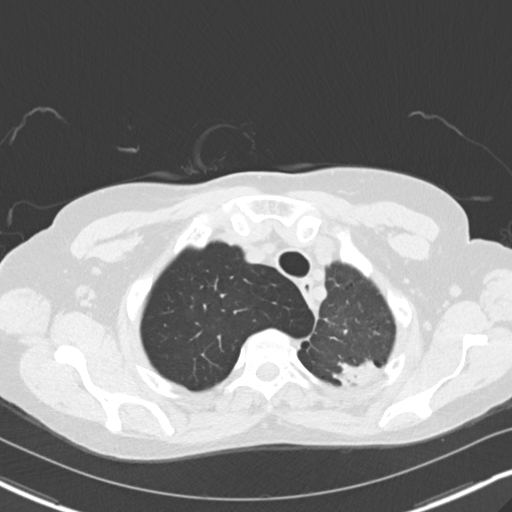
[im 157/169  lung]
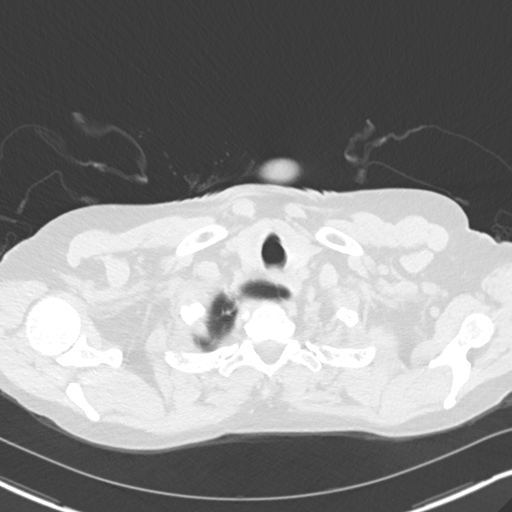

[Series 6: coronal · coronal · 0.66mm/px · 3 of 143 slices shown]
[im 29/143  lung]
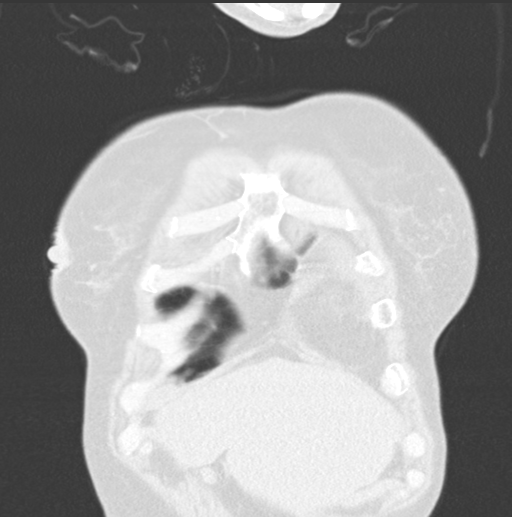
[im 57/143  lung]
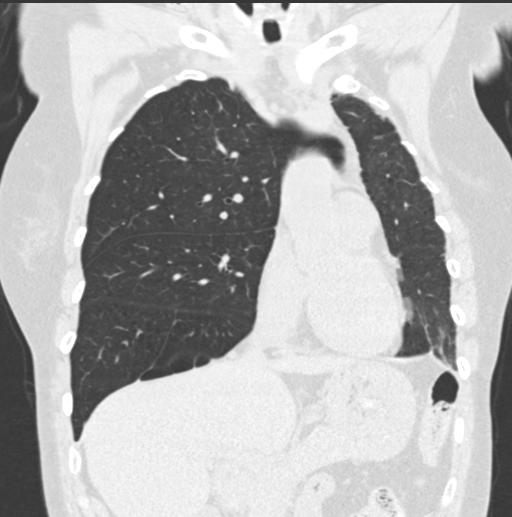
[im 86/143  lung]
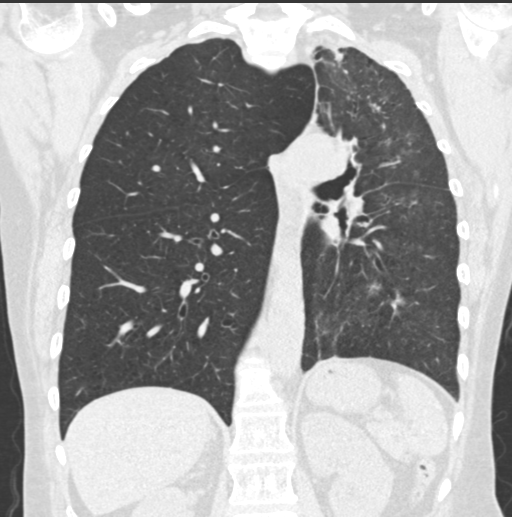

[15 of 36 positions shown; findings below may reference images not displayed]

FINDINGS: Cardiovascular: Leftward shift of the mediastinum. Normal heart
size. Trace pericardial fluid.

Mediastinum/Nodes: No enlarged axillary, mediastinal or hilar
lymphadenopathy. Fluid and gas within the esophagus.

Lungs/Pleura: Central airways are patent. Slight interval increase
in size of left lower lobe nodule measuring 1.1 x 1.0 cm (image 99;
series 5), previously 1.0 x 0.9 cm. Stable 5 mm left lower lobe
pulmonary nodule (image 97; series 5). Similar-appearing 0.6 cm
ground-glass nodule left lower lobe (image 78; series 5).
Similar-appearing bandlike opacity left upper lobe. Additional left
upper lobe peribronchovascular nodularity is grossly similar when
compared to prior exam. Similar-appearing 10 mm subpleural medial
right lower lobe nodule (image 67; series 5).

Upper Abdomen: No acute process.

Musculoskeletal: No aggressive or acute appearing osseous lesions.
IMPRESSION: 1. Continued slight interval increase in size of central left lower
lobe pulmonary nodule concerning for the possibility of low-grade
neoplasm. Recommend continued attention on follow-up if sampling is
not obtained.
2. Additional pulmonary nodules within the left lung as described
above. Recommend attention on follow-up.
3. Aortic Atherosclerosis (421V1-ZWJ.J) and Emphysema (421V1-3HC.G).

## 2020-03-18 DIAGNOSIS — C3490 Malignant neoplasm of unspecified part of unspecified bronchus or lung: Secondary | ICD-10-CM | POA: Diagnosis not present

## 2020-03-18 DIAGNOSIS — I10 Essential (primary) hypertension: Secondary | ICD-10-CM | POA: Diagnosis not present

## 2020-03-18 DIAGNOSIS — F419 Anxiety disorder, unspecified: Secondary | ICD-10-CM | POA: Diagnosis not present

## 2020-03-18 DIAGNOSIS — F32A Depression, unspecified: Secondary | ICD-10-CM | POA: Diagnosis not present

## 2020-03-18 DIAGNOSIS — N179 Acute kidney failure, unspecified: Secondary | ICD-10-CM | POA: Diagnosis not present

## 2020-03-18 DIAGNOSIS — M349 Systemic sclerosis, unspecified: Secondary | ICD-10-CM | POA: Diagnosis not present

## 2020-03-18 DIAGNOSIS — J42 Unspecified chronic bronchitis: Secondary | ICD-10-CM | POA: Diagnosis not present

## 2020-03-18 DIAGNOSIS — E871 Hypo-osmolality and hyponatremia: Secondary | ICD-10-CM | POA: Diagnosis not present

## 2020-03-18 DIAGNOSIS — I2510 Atherosclerotic heart disease of native coronary artery without angina pectoris: Secondary | ICD-10-CM | POA: Diagnosis not present

## 2020-03-19 ENCOUNTER — Encounter: Payer: Self-pay | Admitting: *Deleted

## 2020-03-19 DIAGNOSIS — C3490 Malignant neoplasm of unspecified part of unspecified bronchus or lung: Secondary | ICD-10-CM | POA: Diagnosis not present

## 2020-03-19 DIAGNOSIS — N179 Acute kidney failure, unspecified: Secondary | ICD-10-CM | POA: Diagnosis not present

## 2020-03-19 DIAGNOSIS — I10 Essential (primary) hypertension: Secondary | ICD-10-CM | POA: Diagnosis not present

## 2020-03-19 DIAGNOSIS — J42 Unspecified chronic bronchitis: Secondary | ICD-10-CM | POA: Diagnosis not present

## 2020-03-19 DIAGNOSIS — F419 Anxiety disorder, unspecified: Secondary | ICD-10-CM | POA: Diagnosis not present

## 2020-03-19 DIAGNOSIS — M160 Bilateral primary osteoarthritis of hip: Secondary | ICD-10-CM

## 2020-03-19 DIAGNOSIS — M349 Systemic sclerosis, unspecified: Secondary | ICD-10-CM | POA: Diagnosis not present

## 2020-03-19 DIAGNOSIS — E871 Hypo-osmolality and hyponatremia: Secondary | ICD-10-CM | POA: Diagnosis not present

## 2020-03-19 DIAGNOSIS — I2510 Atherosclerotic heart disease of native coronary artery without angina pectoris: Secondary | ICD-10-CM | POA: Diagnosis not present

## 2020-03-19 DIAGNOSIS — F32A Depression, unspecified: Secondary | ICD-10-CM | POA: Diagnosis not present

## 2020-03-26 ENCOUNTER — Other Ambulatory Visit: Payer: Self-pay

## 2020-03-26 ENCOUNTER — Ambulatory Visit (INDEPENDENT_AMBULATORY_CARE_PROVIDER_SITE_OTHER): Payer: Medicare HMO | Admitting: Internal Medicine

## 2020-03-26 ENCOUNTER — Encounter: Payer: Self-pay | Admitting: Internal Medicine

## 2020-03-26 VITALS — BP 109/72 | HR 84 | Temp 97.9°F | Resp 18 | Ht 68.0 in | Wt 119.8 lb

## 2020-03-26 DIAGNOSIS — Z09 Encounter for follow-up examination after completed treatment for conditions other than malignant neoplasm: Secondary | ICD-10-CM | POA: Insufficient documentation

## 2020-03-26 DIAGNOSIS — F32A Depression, unspecified: Secondary | ICD-10-CM

## 2020-03-26 DIAGNOSIS — K219 Gastro-esophageal reflux disease without esophagitis: Secondary | ICD-10-CM | POA: Diagnosis not present

## 2020-03-26 DIAGNOSIS — F419 Anxiety disorder, unspecified: Secondary | ICD-10-CM | POA: Diagnosis not present

## 2020-03-26 DIAGNOSIS — E782 Mixed hyperlipidemia: Secondary | ICD-10-CM

## 2020-03-26 DIAGNOSIS — I10 Essential (primary) hypertension: Secondary | ICD-10-CM | POA: Diagnosis not present

## 2020-03-26 DIAGNOSIS — Z9109 Other allergy status, other than to drugs and biological substances: Secondary | ICD-10-CM | POA: Diagnosis not present

## 2020-03-26 DIAGNOSIS — E038 Other specified hypothyroidism: Secondary | ICD-10-CM

## 2020-03-26 MED ORDER — LEVOTHYROXINE SODIUM 75 MCG PO TABS: ORAL_TABLET | ORAL | 0 refills | Status: DC

## 2020-03-26 MED ORDER — ATORVASTATIN CALCIUM 10 MG PO TABS: 10.0000 mg | ORAL_TABLET | Freq: Every day | ORAL | 5 refills | Status: DC

## 2020-03-26 MED ORDER — MONTELUKAST SODIUM 10 MG PO TABS: 10.0000 mg | ORAL_TABLET | Freq: Every day | ORAL | 1 refills | Status: DC

## 2020-03-26 MED ORDER — MIRTAZAPINE 7.5 MG PO TABS: 7.5000 mg | ORAL_TABLET | Freq: Every day | ORAL | 1 refills | Status: DC

## 2020-03-26 MED ORDER — AMLODIPINE BESYLATE 5 MG PO TABS: 5.0000 mg | ORAL_TABLET | Freq: Every day | ORAL | 5 refills | Status: DC

## 2020-03-26 MED ORDER — MAGNESIUM OXIDE 400 (241.3 MG) MG PO TABS: 400.0000 mg | ORAL_TABLET | Freq: Two times a day (BID) | ORAL | 0 refills | Status: DC

## 2020-03-26 MED ORDER — FAMOTIDINE 20 MG PO TABS: 20.0000 mg | ORAL_TABLET | Freq: Every day | ORAL | 5 refills | Status: DC

## 2020-03-26 NOTE — Patient Instructions (Signed)
Please continue taking Magnesium supplements as prescribed.  Please continue to take other medications as prescribed.  Please increase fluid intake to at least 1.5 liters in a day. Okay take Electrolyte drinks in between.  Please start taking Ensure supplements. Okay to mix it with other food.

## 2020-03-26 NOTE — Progress Notes (Signed)
Established Patient Office Visit  Subjective:  Patient ID: Kelly Douglas, female    DOB: Aug 08, 1955  Age: 65 y.o. MRN: [id number]  CC:  Chief Complaint  Patient presents with  . Hospitalization Follow-up    Pt was at [facility] she is feeling better still feels tired and weak     HPI Kelly Douglas  is 65 year old female with past medical history of lung CA, scleroderma, HTN, HLD, GERD, hypothyroidism, depression, anxiety and tobacco abuse who presents for follow-up after being discharged from the hospital.  She was admitted for generalized weakness due to electrolyte imbalance from 02/22-02/24.  Her magnesium level was very low, which was repleted with IV magnesium.  Of note, she was prescribed magnesium supplements recently for hypomagnesemia. She was given IV fluids and electrolytes. She also had been having loss of appetite, and Remeron was added recently considering history of depression.  She has been feeling well now, but feels tired at times. She has been taking her medications regularly.  She has stopped taking Ensure supplements as she does not like the taste of it.  She is willing to try it again if she can take it with other food.  She requests medication refills today.  Past Medical History:  Diagnosis Date  . Anxiety   . Arteriosclerotic cardiovascular disease (ASCVD) 2013   coronary calcification-left main, LAD and CX; small pericardial effusion  . Arthritis    hips  . Cancer ([place])    Lung Cancer   . Chronic back pain   . Chronic bronchitis   . Chronic lung disease    Chronic scarring and volume loss-left lung; characteristics of a chronic infectious process-possible MAI  . Depression   . Dyspnea    occasional   . GERD (gastroesophageal reflux disease)    Dr Gala Romney EGD 09/2009->esophagitis, sm HH, antral erosions, atonic esophagus  . Hyperlipidemia   . Hypertension   . Hypothyroid 1981 approx  . Scleroderma ([place])   . Seasonal allergies   . Syncope     Multiple spells over the past 40+ years, likely neurocardiogenic  . Tobacco abuse 06/24/2009    Past Surgical History:  Procedure Laterality Date  . BILATERAL SALPINGOOPHORECTOMY  2013    Dr. Elonda Husky; uterus remains in situ  . BRONCHIAL BIOPSY  11/21/2019   Procedure: BRONCHIAL BIOPSIES;  Surgeon: Collene Gobble, MD;  Location: [facility] ENDOSCOPY;  Service: Pulmonary;;  . BRONCHIAL BRUSHINGS  11/21/2019   Procedure: BRONCHIAL BRUSHINGS;  Surgeon: Collene Gobble, MD;  Location: [facility] ENDOSCOPY;  Service: Pulmonary;;  . BRONCHIAL NEEDLE ASPIRATION BIOPSY  11/21/2019   Procedure: BRONCHIAL NEEDLE ASPIRATION BIOPSIES;  Surgeon: Collene Gobble, MD;  Location: [place];  Service: Pulmonary;;  . BRONCHIAL WASHINGS  11/21/2019   Procedure: BRONCHIAL WASHINGS;  Surgeon: Collene Gobble, MD;  Location: [facility] ENDOSCOPY;  Service: Pulmonary;;  . COLONOSCOPY  09/28/09   anal papilla otherwise normal  . COLONOSCOPY N/A 08/29/2012   CXK:GYJEHUD polyp-removed as described above. tubular adenoma  . COLONOSCOPY WITH PROPOFOL N/A 11/11/2019   Procedure: COLONOSCOPY WITH PROPOFOL;  Surgeon: Daneil Dolin, MD;  Location: [facility] ENDO SUITE;  Service: Endoscopy;  Laterality: N/A;  2:30pm  . ESOPHAGOGASTRODUODENOSCOPY  10/07/09   Dr. Barrie Dunker of esophageal mucosa, diffusely ?esophagitis (bx benign), small HH/antral erosions, erythema bx benign. 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 ESOPHAGOGASTRODUODENOSCOPY (EGD) WITH ESOPHAGEAL DILATION N/A 03/07/2012   JSH:FWYOVZCH, patent, tubular esophagus of uncertain significance-status post biopsy )unremarkable). Hiatal hernia  . FUDUCIAL PLACEMENT  10/18/2017   Procedure: PLACEMENT OF 3 FUDUCIAL INTO LEFT LOWER LOBE OF LUNG-TARGET 1;  Surgeon: Collene Gobble, MD;  Location: [place];  Service: Thoracic;;  . LAPAROSCOPIC APPENDECTOMY  07/16/2011   Procedure: APPENDECTOMY LAPAROSCOPIC;  Surgeon: Donato Heinz, MD;  Location: [facility] ORS;  Service: General;  Laterality: N/A;  .  POLYPECTOMY  11/11/2019   Procedure: POLYPECTOMY;  Surgeon: Daneil Dolin, MD;  Location: [facility] ENDO SUITE;  Service: Endoscopy;;  . TUBAL LIGATION    . VIDEO BRONCHOSCOPY  10/18/2017  . VIDEO BRONCHOSCOPY WITH ENDOBRONCHIAL NAVIGATION N/A 10/18/2017   Procedure: VIDEO BRONCHOSCOPY WITH ENDOBRONCHIAL NAVIGATION;  Surgeon: Collene Gobble, MD;  Location: [facility] OR;  Service: Thoracic;  Laterality: N/A;  . VIDEO BRONCHOSCOPY WITH ENDOBRONCHIAL NAVIGATION Left 11/21/2019   Procedure: VIDEO BRONCHOSCOPY WITH ENDOBRONCHIAL NAVIGATION;  Surgeon: Collene Gobble, MD;  Location: [place] ENDOSCOPY;  Service: Pulmonary;  Laterality: Left;    Family History  Problem Relation Age of Onset  . Stroke Father   . Aneurysm Father   . Coronary artery disease Brother   . Hypertension Brother   . Down syndrome Brother   . Hypertension Brother   . Hypertension Brother        father/mother  . Aneurysm Mother   . Throat cancer Brother 49       throat cancer  . Hypertension Sister   . Diabetes Brother   . Diabetes Brother   . Anesthesia problems Neg Hx   . Hypotension Neg Hx   . Malignant hyperthermia Neg Hx   . Pseudochol deficiency Neg Hx   . Colon cancer Neg Hx     Social History   Socioeconomic History  . Marital status: Married    Spouse name: Not on file  . Number of children: 2  . Years of education: Not on file  . Highest education level: Not on file  Occupational History  . Occupation: disabled    Fish farm manager: NOT EMPLOYED  Tobacco Use  . Smoking status: Current Every Day Smoker    Packs/day: 0.50    Years: 45.00    Pack years: 22.50    Types: Cigarettes    Start date: 07/14/1971  . Smokeless tobacco: Never Used  . Tobacco comment: 0.5 packs of cigarettes smoked daily ARJ 12/31/19  Vaping Use  . Vaping Use: Never used  Substance and Sexual Activity  . Alcohol use: Not Currently    Alcohol/week: 0.0 standard drinks    Comment: quit June 2012-used to drink 6 pack daily, occasional beer per  pt 11/20/19  . Drug use: No  . Sexual activity: Yes    Birth control/protection: Post-menopausal  Other Topics Concern  . Not on file  Social History Narrative  . Not on file   Social Determinants of Health   Financial Resource Strain: Low Risk   . Difficulty of Paying Living Expenses: Not hard at all  Food Insecurity: No Food Insecurity  . Worried About Charity fundraiser in the Last Year: Never true  . Ran Out of Food in the Last Year: Never true  Transportation Needs: No Transportation Needs  . Lack of Transportation (Medical): No  . Lack of Transportation (Non-Medical): No  Physical Activity: Insufficiently Active  . Days of Exercise per Week: 1 day  . Minutes of Exercise per Session: 10 min  Stress: No Stress Concern Present  . Feeling of Stress : Only a little  Social Connections: Moderately Integrated  . Frequency of Communication with Friends  and Family: More than three times a week  . Frequency of Social Gatherings with Friends and Family: More than three times a week  . Attends Religious Services: 1 to 4 times per year  . Active Member of Clubs or Organizations: No  . Attends Archivist Meetings: Never  . Marital Status: Married  Human resources officer Violence: Not At Risk  . Fear of Current or Ex-Partner: No  . Emotionally Abused: No  . Physically Abused: No  . Sexually Abused: No    Outpatient Medications Prior to Visit  Medication Sig Dispense Refill  . acetaminophen (TYLENOL) 650 MG CR tablet Take 1,300 mg by mouth every 8 (eight) hours as needed for pain.     . Cholecalciferol (VITAMIN D) 125 MCG (5000 UT) CAPS Take 5,000 Units by mouth daily.     . DULoxetine (CYMBALTA) 60 MG capsule TAKE 1 CAPSULE EVERY DAY 90 capsule 0  . hydrOXYzine (VISTARIL) 50 MG capsule TAKE 1 TO 2 CAPSULES BY MOUTH AT BEDTIME FOR SLEEP 60 capsule 2  . Multiple Vitamin (MULTIVITAMIN WITH MINERALS) TABS tablet Take 1 tablet by mouth daily.    Marland Kitchen PROAIR RESPICLICK 301 (90 Base)  MCG/ACT AEPB Inhale 2 puffs into the lungs 4 (four) times daily as needed. 1 each 5  . amLODipine (NORVASC) 5 MG tablet Take 1 tablet (5 mg total) by mouth daily. 30 tablet 0  . atorvastatin (LIPITOR) 10 MG tablet Take 1 tablet (10 mg total) by mouth daily. 30 tablet 5  . famotidine (PEPCID) 20 MG tablet Take 1 tablet (20 mg total) by mouth daily. 30 tablet 0  . levothyroxine (SYNTHROID) 75 MCG tablet TAKE 1 TABLET EVERY OTHER DAY ALTERNATING WITH 1/2 TABLET EVERY OTHER DAY 68 tablet 0  . magnesium oxide (MAG-OX) 400 (241.3 Mg) MG tablet Take 1 tablet (400 mg total) by mouth 2 (two) times daily. 60 tablet 0  . mirtazapine (REMERON) 7.5 MG tablet Take 1 tablet (7.5 mg total) by mouth at bedtime. 30 tablet 1  . montelukast (SINGULAIR) 10 MG tablet Take 1 tablet (10 mg total) by mouth at bedtime. 90 tablet 1  . guaiFENesin-dextromethorphan (ROBITUSSIN DM) 100-10 MG/5ML syrup Take 5 mLs by mouth every 4 (four) hours as needed for cough. (Patient not taking: Reported on 03/26/2020) 118 mL 0  . Lidocaine-Menthol 4-1 % PTCH Apply 1 patch topically daily as needed (pain).  (Patient not taking: Reported on 03/26/2020)    . feeding supplement (ENSURE ENLIVE / ENSURE PLUS) LIQD Take 237 mLs by mouth 2 (two) times daily between meals. (Patient not taking: Reported on 03/26/2020) 10000 mL 0  . polyethylene glycol (MIRALAX / GLYCOLAX) 17 g packet Take 17 g by mouth daily as needed for mild constipation. (Patient not taking: Reported on 03/26/2020) 14 each 0   No facility-administered medications prior to visit.    Allergies  Allergen Reactions  . Aspirin Other (See Comments)    Nose bleeds    ROS Review of Systems  Constitutional: Positive for appetite change, fatigue and unexpected weight change. Negative for chills and fever.  HENT: Negative for congestion, postnasal drip, sinus pressure, sinus pain and sore throat.   Eyes: Negative for pain and discharge.  Respiratory: Negative for cough and shortness of  breath.   Cardiovascular: Negative for chest pain and palpitations.  Gastrointestinal: Negative for abdominal pain, constipation, diarrhea, nausea and vomiting.  Endocrine: Negative for polydipsia and polyuria.  Genitourinary: Negative for dysuria and hematuria.  Musculoskeletal: Negative for neck pain  and neck stiffness.  Skin: Negative for rash.  Neurological: Negative for dizziness and weakness.  Psychiatric/Behavioral: Negative for agitation and behavioral problems.      Objective:    Physical Exam Vitals reviewed.  Constitutional:      General: She is not in acute distress.    Appearance: She is underweight. She is not diaphoretic.  HENT:     Head: Normocephalic and atraumatic.     Nose: Nose normal. No congestion.     Mouth/Throat:     Mouth: Mucous membranes are moist.     Pharynx: No posterior oropharyngeal erythema.  Eyes:     General: No scleral icterus.    Extraocular Movements: Extraocular movements intact.     Pupils: Pupils are equal, round, and reactive to light.  Cardiovascular:     Rate and Rhythm: Normal rate and regular rhythm.     Pulses: Normal pulses.     Heart sounds: Normal heart sounds. No murmur heard.   Pulmonary:     Breath sounds: Normal breath sounds. No wheezing or rales.  Abdominal:     Palpations: Abdomen is soft.     Tenderness: There is no abdominal tenderness.  Musculoskeletal:     Cervical back: Neck supple. No tenderness.     Right lower leg: No edema.     Left lower leg: No edema.  Skin:    General: Skin is warm.     Findings: No rash.  Neurological:     General: No focal deficit present.     Mental Status: She is alert and oriented to person, place, and time.  Psychiatric:        Mood and Affect: Mood normal.        Behavior: Behavior normal.     BP 109/72 (BP Location: Right Arm, Patient Position: Sitting, Cuff Size: Normal)   Pulse 84   Temp 97.9 F (36.6 C) (Oral)   Resp 18   Ht 5\' 8"  (1.727 m)   Wt 119 lb 12.8  oz (54.3 kg)   LMP  (LMP Unknown)   SpO2 95%   BMI 18.22 kg/m  Wt Readings from Last 3 Encounters:  03/26/20 119 lb 12.8 oz (54.3 kg)  03/10/20 127 lb 10.3 oz (57.9 kg)  03/04/20 132 lb 6.4 oz (60.1 kg)     Health Maintenance Due  Topic Date Due  . PAP SMEAR-Modifier  11/29/2019    There are no preventive care reminders to display for this patient.  Lab Results  Component Value Date   TSH 2.578 03/10/2020   Lab Results  Component Value Date   WBC 8.6 03/12/2020   HGB 9.2 (L) 03/12/2020   HCT 29.0 (L) 03/12/2020   MCV 81.0 03/12/2020   PLT 339 03/12/2020   Lab Results  Component Value Date   NA 141 03/12/2020   K 3.9 03/12/2020   CO2 27 03/12/2020   GLUCOSE 97 03/12/2020   BUN 5 (L) 03/12/2020   CREATININE 0.87 03/12/2020   BILITOT 0.4 03/12/2020   ALKPHOS 52 03/12/2020   AST 20 03/12/2020   ALT 16 03/12/2020   PROT 5.8 (L) 03/12/2020   ALBUMIN 3.3 (L) 03/12/2020   CALCIUM 9.1 03/12/2020   ANIONGAP 11 03/12/2020   GFR 74.36 11/04/2019   Lab Results  Component Value Date   CHOL 118 03/04/2020   Lab Results  Component Value Date   HDL 36 (L) 03/04/2020   Lab Results  Component Value Date   LDLCALC 62 03/04/2020  Lab Results  Component Value Date   TRIG 105 03/04/2020   Lab Results  Component Value Date   CHOLHDL 3.3 03/04/2020   Lab Results  Component Value Date   HGBA1C 5.1 10/27/2015      Assessment & Plan:   Problem List Items Addressed This Visit      Hospital discharge follow-up  Chart reviewed from hospitalizatioon including discharge summary Medications reconciled and reviewed with the patient     Relevant Orders  Basic Metabolic Panel (BMET)  Magnesium    Cardiovascular and Mediastinum   HTN (hypertension)   Relevant Medications   amLODipine (NORVASC) 5 MG tablet   atorvastatin (LIPITOR) 10 MG tablet   Other Relevant Orders   Basic Metabolic Panel (BMET)     Digestive   GERD (gastroesophageal reflux  disease) Changed to Pepcid during hospitalization considering hypomagnesemia   Relevant Medications   famotidine (PEPCID) 20 MG tablet     Endocrine   Hypothyroidism   Relevant Medications   levothyroxine (SYNTHROID) 75 MCG tablet     Other   Hyperlipidemia   Relevant Medications   amLODipine (NORVASC) 5 MG tablet   atorvastatin (LIPITOR) 10 MG tablet   Anxiety and depression May increase the dose of Remeron later for weight gain benefit   Relevant Medications   mirtazapine (REMERON) 7.5 MG tablet   Pollen allergies   Relevant Medications   montelukast (SINGULAIR) 10 MG tablet   Hypomagnesemia Material provided for high magnesium food options Refilled MAG-OX   Relevant Medications   magnesium oxide (MAG-OX) 400 (241.3 Mg) MG tablet   Other Relevant Orders   Magnesium    Meds ordered this encounter  Medications  . magnesium oxide (MAG-OX) 400 (241.3 Mg) MG tablet    Sig: Take 1 tablet (400 mg total) by mouth 2 (two) times daily.    Dispense:  60 tablet    Refill:  0  . amLODipine (NORVASC) 5 MG tablet    Sig: Take 1 tablet (5 mg total) by mouth daily.    Dispense:  30 tablet    Refill:  5  . atorvastatin (LIPITOR) 10 MG tablet    Sig: Take 1 tablet (10 mg total) by mouth daily.    Dispense:  30 tablet    Refill:  5  . famotidine (PEPCID) 20 MG tablet    Sig: Take 1 tablet (20 mg total) by mouth daily.    Dispense:  30 tablet    Refill:  5  . levothyroxine (SYNTHROID) 75 MCG tablet    Sig: TAKE 1 TABLET EVERY OTHER DAY ALTERNATING WITH 1/2 TABLET EVERY OTHER DAY    Dispense:  68 tablet    Refill:  0  . mirtazapine (REMERON) 7.5 MG tablet    Sig: Take 1 tablet (7.5 mg total) by mouth at bedtime.    Dispense:  30 tablet    Refill:  1  . montelukast (SINGULAIR) 10 MG tablet    Sig: Take 1 tablet (10 mg total) by mouth at bedtime.    Dispense:  90 tablet    Refill:  1    Requesting 1 year supply    Follow-up: Return in about 6 weeks (around 05/07/2020) for  Weight check and BMP check.    Lindell Spar, MD

## 2020-03-27 DIAGNOSIS — F419 Anxiety disorder, unspecified: Secondary | ICD-10-CM | POA: Diagnosis not present

## 2020-03-27 DIAGNOSIS — I2510 Atherosclerotic heart disease of native coronary artery without angina pectoris: Secondary | ICD-10-CM | POA: Diagnosis not present

## 2020-03-27 DIAGNOSIS — C3490 Malignant neoplasm of unspecified part of unspecified bronchus or lung: Secondary | ICD-10-CM | POA: Diagnosis not present

## 2020-03-27 DIAGNOSIS — N179 Acute kidney failure, unspecified: Secondary | ICD-10-CM | POA: Diagnosis not present

## 2020-03-27 DIAGNOSIS — E871 Hypo-osmolality and hyponatremia: Secondary | ICD-10-CM | POA: Diagnosis not present

## 2020-03-27 DIAGNOSIS — F32A Depression, unspecified: Secondary | ICD-10-CM | POA: Diagnosis not present

## 2020-03-27 DIAGNOSIS — I10 Essential (primary) hypertension: Secondary | ICD-10-CM | POA: Diagnosis not present

## 2020-03-27 DIAGNOSIS — M349 Systemic sclerosis, unspecified: Secondary | ICD-10-CM | POA: Diagnosis not present

## 2020-03-27 DIAGNOSIS — J42 Unspecified chronic bronchitis: Secondary | ICD-10-CM | POA: Diagnosis not present

## 2020-03-28 LAB — BASIC METABOLIC PANEL
BUN/Creatinine Ratio: 9 — ABNORMAL LOW (ref 12–28)
BUN: 14 mg/dL (ref 8–27)
CO2: 29 mmol/L (ref 20–29)
Calcium: 10.8 mg/dL — ABNORMAL HIGH (ref 8.7–10.3)
Chloride: 89 mmol/L — ABNORMAL LOW (ref 96–106)
Creatinine, Ser: 1.55 mg/dL — ABNORMAL HIGH (ref 0.57–1.00)
Glucose: 92 mg/dL (ref 65–99)
Potassium: 3.4 mmol/L — ABNORMAL LOW (ref 3.5–5.2)
Sodium: 137 mmol/L (ref 134–144)
eGFR: 37 mL/min/{1.73_m2} — ABNORMAL LOW (ref >59)

## 2020-03-28 LAB — MAGNESIUM: Magnesium: 1.9 mg/dL (ref 1.6–2.3)

## 2020-04-01 DIAGNOSIS — F32A Depression, unspecified: Secondary | ICD-10-CM | POA: Diagnosis not present

## 2020-04-01 DIAGNOSIS — M349 Systemic sclerosis, unspecified: Secondary | ICD-10-CM | POA: Diagnosis not present

## 2020-04-01 DIAGNOSIS — I10 Essential (primary) hypertension: Secondary | ICD-10-CM | POA: Diagnosis not present

## 2020-04-01 DIAGNOSIS — I2510 Atherosclerotic heart disease of native coronary artery without angina pectoris: Secondary | ICD-10-CM | POA: Diagnosis not present

## 2020-04-01 DIAGNOSIS — F419 Anxiety disorder, unspecified: Secondary | ICD-10-CM | POA: Diagnosis not present

## 2020-04-01 DIAGNOSIS — N179 Acute kidney failure, unspecified: Secondary | ICD-10-CM | POA: Diagnosis not present

## 2020-04-01 DIAGNOSIS — E871 Hypo-osmolality and hyponatremia: Secondary | ICD-10-CM | POA: Diagnosis not present

## 2020-04-01 DIAGNOSIS — J42 Unspecified chronic bronchitis: Secondary | ICD-10-CM | POA: Diagnosis not present

## 2020-04-01 DIAGNOSIS — C3490 Malignant neoplasm of unspecified part of unspecified bronchus or lung: Secondary | ICD-10-CM | POA: Diagnosis not present

## 2020-04-03 ENCOUNTER — Encounter: Payer: Self-pay | Admitting: *Deleted

## 2020-04-03 ENCOUNTER — Encounter: Payer: Self-pay | Admitting: Thoracic Surgery (Cardiothoracic Vascular Surgery)

## 2020-04-03 ENCOUNTER — Other Ambulatory Visit: Payer: Self-pay | Admitting: *Deleted

## 2020-04-03 ENCOUNTER — Other Ambulatory Visit: Payer: Self-pay

## 2020-04-03 ENCOUNTER — Other Ambulatory Visit: Payer: Self-pay | Admitting: Thoracic Surgery (Cardiothoracic Vascular Surgery)

## 2020-04-03 ENCOUNTER — Institutional Professional Consult (permissible substitution): Payer: Medicare HMO | Admitting: Thoracic Surgery (Cardiothoracic Vascular Surgery)

## 2020-04-03 VITALS — BP 113/70 | HR 94 | Resp 20 | Ht 68.0 in | Wt 124.0 lb

## 2020-04-03 DIAGNOSIS — R911 Solitary pulmonary nodule: Secondary | ICD-10-CM

## 2020-04-03 DIAGNOSIS — C3492 Malignant neoplasm of unspecified part of left bronchus or lung: Secondary | ICD-10-CM

## 2020-04-03 NOTE — Progress Notes (Signed)
[address]       [place] [address]             [phone]                    Jasmina P Mishra [place] Medical Record #[id number] Date of Birth: [birth year]  Referring: Collene Gobble, MD Primary Care: [name], MD Primary Cardiologist: No primary care provider on file.  Chief Complaint:    Chief Complaint  Patient presents with  . Lung Lesion    Initial surgical consult, CT chest 7/19, PET 11/22, PFT 12/6    History of Present Illness:    KRISTOL ALMANZAR 65 y.o. female referred by Dr. Lamonte Sakai for surgical evaluation of a left lower lobe biopsy-proven pulmonary nodule.  She originally was seen late last year as a consult following an iatrogenic pneumothorax following the biopsy.  She has had a difficult time over the past several months with repeated visits to the emergency department for nausea and vomiting.  She was noted to have a significant electrolyte imbalance which is now resolved.  She denies any shortness of breath except occasionally on exertion.  She denies any chest pain.  She has lost over 25 pounds over the last several months.  She denies any neurologic symptoms.    Smoking Hx: Currently smoking   Zubrod Score: At the time of surgery this patient's most appropriate activity status/level should be described as: []     0    Normal activity, no symptoms [x]     1    Restricted in physical strenuous activity but ambulatory, able to do out light work []     2    Ambulatory and capable of self care, unable to do work activities, up and about               >50 % of waking hours                              []     3    Only limited self care, in bed greater than 50% of waking hours []     4    Completely disabled, no self care, confined to bed or chair []     5    Moribund   Past Medical History:  Diagnosis Date  . Anxiety   . Arteriosclerotic cardiovascular disease (ASCVD) 2013   coronary calcification-left main, LAD and CX; small  pericardial effusion  . Arthritis    hips  . Cancer ([place])    Lung Cancer   . Chronic back pain   . Chronic bronchitis   . Chronic lung disease    Chronic scarring and volume loss-left lung; characteristics of a chronic infectious process-possible MAI  . Depression   . Dyspnea    occasional   . GERD (gastroesophageal reflux disease)    Dr Gala Romney EGD 09/2009->esophagitis, sm HH, antral erosions, atonic esophagus  . Hyperlipidemia   . Hypertension   . Hypothyroid 1981 approx  . Scleroderma ([place])   . Seasonal allergies   . Syncope    Multiple spells over the past 40+ years, likely neurocardiogenic  . Tobacco abuse 06/24/2009    Past Surgical History:  Procedure Laterality Date  . BILATERAL SALPINGOOPHORECTOMY  2013    Dr. Elonda Husky; uterus remains in situ  . BRONCHIAL BIOPSY  11/21/2019   Procedure: BRONCHIAL BIOPSIES;  Surgeon: Lamonte Sakai,  Rose Fillers, MD;  Location: [facility] ENDOSCOPY;  Service: Pulmonary;;  . BRONCHIAL BRUSHINGS  11/21/2019   Procedure: BRONCHIAL BRUSHINGS;  Surgeon: Collene Gobble, MD;  Location: [facility] ENDOSCOPY;  Service: Pulmonary;;  . BRONCHIAL NEEDLE ASPIRATION BIOPSY  11/21/2019   Procedure: BRONCHIAL NEEDLE ASPIRATION BIOPSIES;  Surgeon: Collene Gobble, MD;  Location: [place];  Service: Pulmonary;;  . BRONCHIAL WASHINGS  11/21/2019   Procedure: BRONCHIAL WASHINGS;  Surgeon: Collene Gobble, MD;  Location: [facility] ENDOSCOPY;  Service: Pulmonary;;  . COLONOSCOPY  09/28/09   anal papilla otherwise normal  . COLONOSCOPY N/A 08/29/2012   DJM:EQASTMH polyp-removed as described above. tubular adenoma  . COLONOSCOPY WITH PROPOFOL N/A 11/11/2019   Procedure: COLONOSCOPY WITH PROPOFOL;  Surgeon: Daneil Dolin, MD;  Location: [facility]O [facility];  Service: Endoscopy;  Laterality: N/A;  2:30pm  . ESOPHAGOGASTRODUODENOSCOPY  10/07/09   Dr. Barrie Dunker of esophageal mucosa, diffusely ?esophagitis (bx benign), small HH/antral erosions, erythema bx benign. 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 ESOPHAGOGASTRODUODENOSCOPY (EGD) WITH ESOPHAGEAL DILATION N/A 03/07/2012   DQQ:[id number], patent, tubular esophagus of uncertain significance-status post biopsy )unremarkable). Hiatal hernia  . FUDUCIAL PLACEMENT  10/18/2017   Procedure: PLACEMENT OF 3 FUDUCIAL INTO LEFT LOWER LOBE OF LUNG-TARGET 1;  Surgeon: Collene Gobble, MD;  Location: [facility];  Service: Thoracic;;  . LAPAROSCOPIC APPENDECTOMY  07/16/2011   Procedure: APPENDECTOMY LAPAROSCOPIC;  Surgeon: Donato Heinz, MD;  Location: [facility] ORS;  Service: General;  Laterality: N/A;  . POLYPECTOMY  11/11/2019   Procedure: POLYPECTOMY;  Surgeon: Daneil Dolin, MD;  Location: [facility];  Service: Endoscopy;;  . TUBAL LIGATION    . VIDEO BRONCHOSCOPY  10/18/2017  . VIDEO BRONCHOSCOPY WITH ENDOBRONCHIAL NAVIGATION N/A 10/18/2017   Procedure: VIDEO BRONCHOSCOPY WITH ENDOBRONCHIAL NAVIGATION;  Surgeon: Collene Gobble, MD;  Location: [facility] OR;  Service: Thoracic;  Laterality: N/A;  . VIDEO BRONCHOSCOPY WITH ENDOBRONCHIAL NAVIGATION Left 11/21/2019   Procedure: VIDEO BRONCHOSCOPY WITH ENDOBRONCHIAL NAVIGATION;  Surgeon: Collene Gobble, MD;  Location: [facility] ENDOSCOPY;  Service: Pulmonary;  Laterality: Left;    Family History  Problem Relation Age of Onset  . Stroke Father   . Aneurysm Father   . Coronary artery disease Brother   . Hypertension Brother   . Down syndrome Brother   . Hypertension Brother   . Hypertension Brother        father/mother  . Aneurysm Mother   . Throat cancer Brother 49       throat cancer  . Hypertension Sister   . Diabetes Brother   . Diabetes Brother   . Anesthesia problems Neg Hx   . Hypotension Neg Hx   . Malignant hyperthermia Neg Hx   . Pseudochol deficiency Neg Hx   . Colon cancer Neg Hx      Social History   Tobacco Use  Smoking Status Current Every Day Smoker  . Packs/day: 0.50  . Years: 45.00  . Pack years: 22.50  . Types: Cigarettes  . Start date: 07/14/1971  Smokeless Tobacco  Never Used  Tobacco Comment   0.5 packs of cigarettes smoked daily ARJ 12/31/19    Social History   Substance and Sexual Activity  Alcohol Use Not Currently  . Alcohol/week: 0.0 standard drinks   Comment: quit June 2012-used to drink 6 pack daily, occasional beer per pt 11/20/19     Allergies  Allergen Reactions  . Aspirin Other (See Comments)    Nose bleeds    Current Outpatient Medications  Medication Sig Dispense Refill  . acetaminophen (TYLENOL) 650 MG CR tablet Take 1,300 mg by mouth every 8 (eight) hours as needed for pain.     Marland Kitchen amLODipine (NORVASC) 5 MG tablet Take 1 tablet (5 mg total) by mouth daily. 30 tablet 5  . atorvastatin (LIPITOR) 10 MG tablet Take 1 tablet (10 mg total) by mouth daily. 30 tablet 5  . Cholecalciferol (VITAMIN D) 125 MCG (5000 UT) CAPS Take 5,000 Units by mouth daily.     . DULoxetine (CYMBALTA) 60 MG capsule TAKE 1 CAPSULE EVERY DAY 90 capsule 0  . famotidine (PEPCID) 20 MG tablet Take 1 tablet (20 mg total) by mouth daily. 30 tablet 5  . guaiFENesin-dextromethorphan (ROBITUSSIN DM) 100-10 MG/5ML syrup Take 5 mLs by mouth every 4 (four) hours as needed for cough. 118 mL 0  . hydrOXYzine (VISTARIL) 50 MG capsule TAKE 1 TO 2 CAPSULES BY MOUTH AT BEDTIME FOR SLEEP 60 capsule 2  . levothyroxine (SYNTHROID) 75 MCG tablet TAKE 1 TABLET EVERY OTHER DAY ALTERNATING WITH 1/2 TABLET EVERY OTHER DAY 68 tablet 0  . Lidocaine-Menthol 4-1 % PTCH Apply 1 patch topically daily as needed (pain).    . magnesium oxide (MAG-OX) 400 (241.3 Mg) MG tablet Take 1 tablet (400 mg total) by mouth 2 (two) times daily. 60 tablet 0  . mirtazapine (REMERON) 7.5 MG tablet Take 1 tablet (7.5 mg total) by mouth at bedtime. 30 tablet 1  . montelukast (SINGULAIR) 10 MG tablet Take 1 tablet (10 mg total) by mouth at bedtime. 90 tablet 1  . Multiple Vitamin (MULTIVITAMIN WITH MINERALS) TABS tablet Take 1 tablet by mouth daily.    Marland Kitchen PROAIR RESPICLICK 371 (90 Base) MCG/ACT AEPB Inhale 2  puffs into the lungs 4 (four) times daily as needed. 1 each 5   No current facility-administered medications for this visit.    Review of Systems  Constitutional: Positive for malaise/fatigue and weight loss.  Respiratory: Positive for cough and shortness of breath.   Cardiovascular: Negative for chest pain.  Musculoskeletal: Negative.   Neurological: Negative.      PHYSICAL EXAMINATION: BP 113/70 (BP Location: Left Arm, Patient Position: Sitting)   Pulse 94   Resp 20   Ht 5\' 8"  (1.727 m)   Wt 124 lb (56.2 kg)   LMP  (LMP Unknown)   SpO2 91% Comment: RA  BMI 18.85 kg/m  Physical Exam Constitutional:      General: She is not in acute distress.    Appearance: She is ill-appearing. She is not toxic-appearing.  HENT:     Head: Normocephalic and atraumatic.  Eyes:     Extraocular Movements: Extraocular movements intact.  Cardiovascular:     Rate and Rhythm: Normal rate.     Pulses: Normal pulses.     Heart sounds: No murmur heard.   Pulmonary:     Effort: Pulmonary effort is normal.     Comments: Distant breath sounds Abdominal:     General: Abdomen is flat. There is no distension.  Musculoskeletal:        General: Normal range of motion.     Cervical back: Normal range of motion.  Skin:    General: Skin is warm and dry.  Neurological:     General: No focal deficit present.     Mental Status: She is alert and oriented to person, place, and time.     Diagnostic Studies & Laboratory data:     Recent Radiology Findings:   DG Chest  2 View  Result Date: 03/10/2020 CLINICAL DATA:  Rigors, elevated troponin shakiness and nervousness. EXAM: CHEST - 2 VIEW COMPARISON:  CT chest from July of 2021, chest x-ray from February 24, 2020 FINDINGS: Image rotated to the LEFT. Cardiac and mediastinal contours and hilar structures are stable accounting for this finding. Stable appearance of volume loss in the LEFT hemithorax and fiducial markers at the LEFT lung base with increased  density at the LEFT lung apex in this patient with history of lung cancer. No new sign of lobar consolidation or pleural effusion. On limited assessment no acute skeletal process. IMPRESSION: Stable appearance of the chest. No acute abnormality. Electronically Signed   By: Zetta Bills M.D.   On: 03/10/2020 19:26       I have independently reviewed the above radiology studies  and reviewed the findings with the patient.   Recent Lab Findings: Lab Results  Component Value Date   WBC 8.6 03/12/2020   HGB 9.2 (L) 03/12/2020   HCT 29.0 (L) 03/12/2020   PLT 339 03/12/2020   GLUCOSE 92 03/26/2020   CHOL 118 03/04/2020   TRIG 105 03/04/2020   HDL 36 (L) 03/04/2020   LDLCALC 62 03/04/2020   ALT 16 03/12/2020   AST 20 03/12/2020   NA 137 03/26/2020   K 3.4 (L) 03/26/2020   CL 89 (L) 03/26/2020   CREATININE 1.55 (H) 03/26/2020   BUN 14 03/26/2020   CO2 29 03/26/2020   TSH 2.578 03/10/2020   INR 1.1 (H) 11/04/2019   HGBA1C 5.1 10/27/2015     PFTs: - FVC: 125% - FEV1: 93% -DLCO: 43%  Problem List: 2 cm left lower lobe non-small cell lung cancer  Marginal pulmonary function testing Frailty Current smoker  Assessment / Plan:   65 year old female with a biopsy-proven left lower lobe pulmonary non-small cell lung cancer.  She is currently smoking but states that she is able to quit starting tomorrow.  She would like to proceed with surgery.  She does appear quite frail and has had multiple hospital admissions for an electrolyte imbalance which appears to be improving.  She has lost approximately 25 pounds over the last 3 months.  Based off of her pulmonary function test with DLCO 43% there is a chance she may require supplemental oxygen who explained this to her.  I have ordered repeat CT given we do not have any cross-sectional imaging in the last 3 months as well as a stress test.  She is tentatively scheduled for April 29, 2020 for a left robotic assisted thoracoscopy with left  lower lobectomy.     I  spent 40 minutes with  the patient face to face in counseling and coordination of care.    Lajuana Matte 04/03/2020 4:22 PM

## 2020-04-06 ENCOUNTER — Other Ambulatory Visit: Payer: Self-pay | Admitting: Thoracic Surgery (Cardiothoracic Vascular Surgery)

## 2020-04-06 DIAGNOSIS — R911 Solitary pulmonary nodule: Secondary | ICD-10-CM

## 2020-04-08 DIAGNOSIS — F32A Depression, unspecified: Secondary | ICD-10-CM | POA: Diagnosis not present

## 2020-04-08 DIAGNOSIS — I10 Essential (primary) hypertension: Secondary | ICD-10-CM | POA: Diagnosis not present

## 2020-04-08 DIAGNOSIS — F419 Anxiety disorder, unspecified: Secondary | ICD-10-CM | POA: Diagnosis not present

## 2020-04-08 DIAGNOSIS — C3490 Malignant neoplasm of unspecified part of unspecified bronchus or lung: Secondary | ICD-10-CM | POA: Diagnosis not present

## 2020-04-08 DIAGNOSIS — N179 Acute kidney failure, unspecified: Secondary | ICD-10-CM | POA: Diagnosis not present

## 2020-04-08 DIAGNOSIS — J42 Unspecified chronic bronchitis: Secondary | ICD-10-CM | POA: Diagnosis not present

## 2020-04-08 DIAGNOSIS — E871 Hypo-osmolality and hyponatremia: Secondary | ICD-10-CM | POA: Diagnosis not present

## 2020-04-08 DIAGNOSIS — M349 Systemic sclerosis, unspecified: Secondary | ICD-10-CM | POA: Diagnosis not present

## 2020-04-08 DIAGNOSIS — I2510 Atherosclerotic heart disease of native coronary artery without angina pectoris: Secondary | ICD-10-CM | POA: Diagnosis not present

## 2020-04-13 ENCOUNTER — Ambulatory Visit (HOSPITAL_COMMUNITY): Payer: Medicare HMO | Admitting: Hematology

## 2020-04-15 DIAGNOSIS — N179 Acute kidney failure, unspecified: Secondary | ICD-10-CM | POA: Diagnosis not present

## 2020-04-15 DIAGNOSIS — F419 Anxiety disorder, unspecified: Secondary | ICD-10-CM | POA: Diagnosis not present

## 2020-04-15 DIAGNOSIS — F32A Depression, unspecified: Secondary | ICD-10-CM | POA: Diagnosis not present

## 2020-04-15 DIAGNOSIS — J42 Unspecified chronic bronchitis: Secondary | ICD-10-CM | POA: Diagnosis not present

## 2020-04-15 DIAGNOSIS — I10 Essential (primary) hypertension: Secondary | ICD-10-CM | POA: Diagnosis not present

## 2020-04-15 DIAGNOSIS — I2510 Atherosclerotic heart disease of native coronary artery without angina pectoris: Secondary | ICD-10-CM | POA: Diagnosis not present

## 2020-04-15 DIAGNOSIS — M349 Systemic sclerosis, unspecified: Secondary | ICD-10-CM | POA: Diagnosis not present

## 2020-04-15 DIAGNOSIS — C3490 Malignant neoplasm of unspecified part of unspecified bronchus or lung: Secondary | ICD-10-CM | POA: Diagnosis not present

## 2020-04-15 DIAGNOSIS — E871 Hypo-osmolality and hyponatremia: Secondary | ICD-10-CM | POA: Diagnosis not present

## 2020-04-18 ENCOUNTER — Emergency Department (HOSPITAL_COMMUNITY): Payer: Medicare HMO

## 2020-04-18 ENCOUNTER — Encounter (HOSPITAL_COMMUNITY): Payer: Self-pay | Admitting: Emergency Medicine

## 2020-04-18 ENCOUNTER — Emergency Department (HOSPITAL_COMMUNITY)
Admission: EM | Admit: 2020-04-18 | Discharge: 2020-04-19 | Disposition: A | Discharge status: Home / Self Care | Payer: Medicare HMO | Attending: Emergency Medicine | Admitting: Emergency Medicine

## 2020-04-18 ENCOUNTER — Other Ambulatory Visit: Payer: Self-pay

## 2020-04-18 DIAGNOSIS — R112 Nausea with vomiting, unspecified: Secondary | ICD-10-CM | POA: Insufficient documentation

## 2020-04-18 DIAGNOSIS — I2510 Atherosclerotic heart disease of native coronary artery without angina pectoris: Secondary | ICD-10-CM | POA: Insufficient documentation

## 2020-04-18 DIAGNOSIS — R911 Solitary pulmonary nodule: Secondary | ICD-10-CM

## 2020-04-18 DIAGNOSIS — Z85118 Personal history of other malignant neoplasm of bronchus and lung: Secondary | ICD-10-CM | POA: Insufficient documentation

## 2020-04-18 DIAGNOSIS — K219 Gastro-esophageal reflux disease without esophagitis: Secondary | ICD-10-CM | POA: Diagnosis not present

## 2020-04-18 DIAGNOSIS — R000 Tachycardia, unspecified: Secondary | ICD-10-CM | POA: Insufficient documentation

## 2020-04-18 DIAGNOSIS — E039 Hypothyroidism, unspecified: Secondary | ICD-10-CM | POA: Diagnosis not present

## 2020-04-18 DIAGNOSIS — I10 Essential (primary) hypertension: Secondary | ICD-10-CM | POA: Insufficient documentation

## 2020-04-18 DIAGNOSIS — Z79899 Other long term (current) drug therapy: Secondary | ICD-10-CM | POA: Diagnosis not present

## 2020-04-18 DIAGNOSIS — J449 Chronic obstructive pulmonary disease, unspecified: Secondary | ICD-10-CM | POA: Diagnosis not present

## 2020-04-18 DIAGNOSIS — F17210 Nicotine dependence, cigarettes, uncomplicated: Secondary | ICD-10-CM | POA: Insufficient documentation

## 2020-04-18 DIAGNOSIS — R109 Unspecified abdominal pain: Secondary | ICD-10-CM | POA: Diagnosis not present

## 2020-04-18 DIAGNOSIS — R1084 Generalized abdominal pain: Secondary | ICD-10-CM | POA: Insufficient documentation

## 2020-04-18 LAB — COMPREHENSIVE METABOLIC PANEL
ALT: 20 U/L (ref 0–44)
AST: 39 U/L (ref 15–41)
Albumin: 5 g/dL (ref 3.5–5.0)
Alkaline Phosphatase: 83 U/L (ref 38–126)
Anion gap: 21 — ABNORMAL HIGH (ref 5–15)
BUN: 7 mg/dL — ABNORMAL LOW (ref 8–23)
CO2: 20 mmol/L — ABNORMAL LOW (ref 22–32)
Calcium: 10.4 mg/dL — ABNORMAL HIGH (ref 8.9–10.3)
Chloride: 94 mmol/L — ABNORMAL LOW (ref 98–111)
Creatinine, Ser: 0.99 mg/dL (ref 0.44–1.00)
GFR, Estimated: >60 mL/min (ref >60)
Glucose, Bld: 152 mg/dL — ABNORMAL HIGH (ref 70–99)
Potassium: 3.5 mmol/L (ref 3.5–5.1)
Sodium: 135 mmol/L (ref 135–145)
Total Bilirubin: 0.7 mg/dL (ref 0.3–1.2)
Total Protein: 9.2 g/dL — ABNORMAL HIGH (ref 6.5–8.1)

## 2020-04-18 LAB — CBC WITH DIFFERENTIAL/PLATELET
Abs Immature Granulocytes: 0.05 10*3/uL (ref 0.00–0.07)
Basophils Absolute: 0 10*3/uL (ref 0.0–0.1)
Basophils Relative: 0 %
Eosinophils Absolute: 0 10*3/uL (ref 0.0–0.5)
Eosinophils Relative: 0 %
HCT: 38.8 % (ref 36.0–46.0)
Hemoglobin: 12.5 g/dL (ref 12.0–15.0)
Immature Granulocytes: 0 %
Lymphocytes Relative: 16 %
Lymphs Abs: 2.2 10*3/uL (ref 0.7–4.0)
MCH: 25.8 pg — ABNORMAL LOW (ref 26.0–34.0)
MCHC: 32.2 g/dL (ref 30.0–36.0)
MCV: 80 fL (ref 80.0–100.0)
Monocytes Absolute: 0.7 10*3/uL (ref 0.1–1.0)
Monocytes Relative: 5 %
Neutro Abs: 10.5 10*3/uL — ABNORMAL HIGH (ref 1.7–7.7)
Neutrophils Relative %: 79 %
Platelets: 439 10*3/uL — ABNORMAL HIGH (ref 150–400)
RBC: 4.85 MIL/uL (ref 3.87–5.11)
RDW: 15.5 % (ref 11.5–15.5)
WBC: 13.5 10*3/uL — ABNORMAL HIGH (ref 4.0–10.5)
nRBC: 0 % (ref 0.0–0.2)

## 2020-04-18 LAB — LIPASE, BLOOD: Lipase: 57 U/L — ABNORMAL HIGH (ref 11–51)

## 2020-04-18 LAB — MAGNESIUM: Magnesium: 1.8 mg/dL (ref 1.7–2.4)

## 2020-04-18 LAB — TROPONIN I (HIGH SENSITIVITY): Troponin I (High Sensitivity): 10 ng/L (ref <18)

## 2020-04-18 MED ORDER — ONDANSETRON 4 MG PO TBDP: 4.0000 mg | ORAL_TABLET | Freq: Three times a day (TID) | ORAL | 0 refills | Status: DC | PRN

## 2020-04-18 MED ORDER — HYDROMORPHONE HCL 1 MG/ML IJ SOLN
0.5000 mg | Freq: Once | INTRAMUSCULAR | Status: AC
Start: 2020-04-18 — End: 2020-04-18
  Administered 2020-04-18: 0.5 mg via INTRAVENOUS
  Filled 2020-04-18: qty 1

## 2020-04-18 MED ORDER — FAMOTIDINE 20 MG PO TABS
20.0000 mg | ORAL_TABLET | Freq: Once | ORAL | Status: AC
  Administered 2020-04-18: 20 mg via ORAL
  Filled 2020-04-18: qty 1

## 2020-04-18 MED ORDER — IOHEXOL 300 MG/ML  SOLN
75.0000 mL | Freq: Once | INTRAMUSCULAR | Status: AC | PRN
  Administered 2020-04-18: 75 mL via INTRAVENOUS

## 2020-04-18 MED ORDER — LIDOCAINE VISCOUS HCL 2 % MT SOLN
15.0000 mL | Freq: Once | OROMUCOSAL | Status: AC
  Administered 2020-04-18: 15 mL via ORAL
  Filled 2020-04-18: qty 15

## 2020-04-18 MED ORDER — ONDANSETRON HCL 4 MG/2ML IJ SOLN
4.0000 mg | Freq: Once | INTRAMUSCULAR | Status: AC
  Administered 2020-04-18: 4 mg via INTRAVENOUS
  Filled 2020-04-18: qty 2

## 2020-04-18 MED ORDER — ALUM & MAG HYDROXIDE-SIMETH 200-200-20 MG/5ML PO SUSP
30.0000 mL | Freq: Once | ORAL | Status: AC
  Administered 2020-04-18: 30 mL via ORAL
  Filled 2020-04-18: qty 30

## 2020-04-18 NOTE — ED Provider Notes (Signed)
[facility] EMERGENCY DEPARTMENT Provider Note   CSN: [id number] Arrival date & time: 04/18/20  2015     History Chief Complaint  Patient presents with  . Abdominal Pain    Kelly Douglas is a 65 y.o. female.  Patient with history of coronary artery disease, previous appendectomy and tubal ligation, history of lung cancer --presents to the emergency department for evaluation of abdominal pain, nausea and vomiting.  Abdominal pain started about 2 days ago and vomiting was persistent today.  No diarrhea.  Pain is generalized over her abdomen.  No chest pain or shortness of breath.  States that she does have a history of GERD and her medications were changed.  She denies fever, URI symptoms, cough.  Onset of symptoms acute.  Course is constant.  Nothing make symptoms better or worse.  Denies heavy NSAID use.  Denies alcohol use.        Past Medical History:  Diagnosis Date  . Anxiety   . Arteriosclerotic cardiovascular disease (ASCVD) 2013   coronary calcification-left main, LAD and CX; small pericardial effusion  . Arthritis    hips  . Cancer ([place])    Lung Cancer   . Chronic back pain   . Chronic bronchitis   . Chronic lung disease    Chronic scarring and volume loss-left lung; characteristics of a chronic infectious process-possible MAI  . Depression   . Dyspnea    occasional   . GERD (gastroesophageal reflux disease)    Dr Gala Romney EGD 09/2009->esophagitis, sm HH, antral erosions, atonic esophagus  . Hyperlipidemia   . Hypertension   . Hypothyroid 1981 approx  . Scleroderma ([place])   . Seasonal allergies   . Syncope    Multiple spells over the past 40+ years, likely neurocardiogenic  . Tobacco abuse 06/24/2009    Patient Active Problem List   Diagnosis Date Noted  . Hospital discharge follow-up 03/26/2020  . Hypomagnesemia 03/10/2020  . Elevated troponin   . Generalized weakness   . Leukocytosis   . AKI (acute kidney injury) ([place]) 02/23/2020  . COPD (chronic  obstructive pulmonary disease) ([place]) 12/31/2019  . Pneumothorax on left 12/10/2019  . Thrombocytosis 12/10/2019  . Hx of adenomatous colonic polyps 09/03/2019  . Cervical lymphadenopathy 05/16/2019  . Hypokalemia 03/28/2018  . S/P bronchoscopy 10/18/2017  . Adenocarcinoma of left lung, stage 1 ([place]) 11/18/2016  . Pollen allergies 05/19/2015  . Nicotine dependence 09/17/2013  . Hoarseness of voice 09/16/2013  . Cervical pain (neck) 06/21/2013  . Back pain with radiation 06/21/2013  . IDA (iron deficiency anemia) 08/10/2012  . HTN (hypertension) 02/22/2012  . Insomnia 05/09/2011  . GERD (gastroesophageal reflux disease)   . Scleroderma ([place])   . Hypothyroidism   . Arteriosclerotic cardiovascular disease (ASCVD)   . Syncope   . Anxiety and depression 06/24/2009  . Vitamin D deficiency 03/18/2009  . Hyperlipidemia 03/18/2009    Past Surgical History:  Procedure Laterality Date  . BILATERAL SALPINGOOPHORECTOMY  2013    Dr. Elonda Husky; uterus remains in situ  . BRONCHIAL BIOPSY  11/21/2019   Procedure: BRONCHIAL BIOPSIES;  Surgeon: Collene Gobble, MD;  Location: [facility] ENDOSCOPY;  Service: Pulmonary;;  . BRONCHIAL BRUSHINGS  11/21/2019   Procedure: BRONCHIAL BRUSHINGS;  Surgeon: Collene Gobble, MD;  Location: [facility] ENDOSCOPY;  Service: Pulmonary;;  . BRONCHIAL NEEDLE ASPIRATION BIOPSY  11/21/2019   Procedure: BRONCHIAL NEEDLE ASPIRATION BIOPSIES;  Surgeon: Collene Gobble, MD;  Location: [facility] ENDOSCOPY;  Service: Pulmonary;;  . BRONCHIAL WASHINGS  11/21/2019  Procedure: BRONCHIAL WASHINGS;  Surgeon: Collene Gobble, MD;  Location: [facility] ENDOSCOPY;  Service: Pulmonary;;  . COLONOSCOPY  09/28/09   anal papilla otherwise normal  . COLONOSCOPY N/A 08/29/2012   PPJ:[id number] polyp-removed as described above. tubular adenoma  . COLONOSCOPY WITH PROPOFOL N/A 11/11/2019   Procedure: COLONOSCOPY WITH PROPOFOL;  Surgeon: Daneil Dolin, MD;  Location: [facility] ENDO SUITE;  Service: Endoscopy;  Laterality: N/A;   2:30pm  . ESOPHAGOGASTRODUODENOSCOPY  10/07/09   Dr. Barrie Dunker of esophageal mucosa, diffusely ?esophagitis (bx benign), small HH/antral erosions, erythema bx benign. 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 ESOPHAGOGASTRODUODENOSCOPY (EGD) WITH ESOPHAGEAL DILATION N/A 03/07/2012   IWP:[id number], patent, tubular esophagus of uncertain significance-status post biopsy )unremarkable). Hiatal hernia  . FUDUCIAL PLACEMENT  10/18/2017   Procedure: PLACEMENT OF 3 FUDUCIAL INTO LEFT LOWER LOBE OF LUNG-TARGET 1;  Surgeon: Collene Gobble, MD;  Location: [facility];  Service: Thoracic;;  . LAPAROSCOPIC APPENDECTOMY  07/16/2011   Procedure: APPENDECTOMY LAPAROSCOPIC;  Surgeon: Donato Heinz, MD;  Location: [facility] ORS;  Service: General;  Laterality: N/A;  . POLYPECTOMY  11/11/2019   Procedure: POLYPECTOMY;  Surgeon: Daneil Dolin, MD;  Location: [facility] ENDO SUITE;  Service: Endoscopy;;  . TUBAL LIGATION    . VIDEO BRONCHOSCOPY  10/18/2017  . VIDEO BRONCHOSCOPY WITH ENDOBRONCHIAL NAVIGATION N/A 10/18/2017   Procedure: VIDEO BRONCHOSCOPY WITH ENDOBRONCHIAL NAVIGATION;  Surgeon: Collene Gobble, MD;  Location: [facility] OR;  Service: Thoracic;  Laterality: N/A;  . VIDEO BRONCHOSCOPY WITH ENDOBRONCHIAL NAVIGATION Left 11/21/2019   Procedure: VIDEO BRONCHOSCOPY WITH ENDOBRONCHIAL NAVIGATION;  Surgeon: Collene Gobble, MD;  Location: [place] ENDOSCOPY;  Service: Pulmonary;  Laterality: Left;     OB History    Gravida  2   Para  2   Term  2   Preterm      AB      Living  2     SAB      IAB      Ectopic      Multiple      Live Births              Family History  Problem Relation Age of Onset  . Stroke Father   . Aneurysm Father   . Coronary artery disease Brother   . Hypertension Brother   . Down syndrome Brother   . Hypertension Brother   . Hypertension Brother        father/mother  . Aneurysm Mother   . Throat cancer Brother 49       throat cancer  . Hypertension Sister   . Diabetes  Brother   . Diabetes Brother   . Anesthesia problems Neg Hx   . Hypotension Neg Hx   . Malignant hyperthermia Neg Hx   . Pseudochol deficiency Neg Hx   . Colon cancer Neg Hx     Social History   Tobacco Use  . Smoking status: Current Every Day Smoker    Packs/day: 0.50    Years: 45.00    Pack years: 22.50    Types: Cigarettes    Start date: 07/14/1971  . Smokeless tobacco: Never Used  . Tobacco comment: 0.5 packs of cigarettes smoked daily ARJ 12/31/19  Vaping Use  . Vaping Use: Never used  Substance Use Topics  . Alcohol use: Not Currently    Alcohol/week: 0.0 standard drinks    Comment: quit June 2012-used to drink 6 pack daily, occasional beer per pt 11/20/19  . Drug use: No  Home Medications Prior to Admission medications   Medication Sig Start Date End Date Taking? Authorizing Provider  acetaminophen (TYLENOL) 650 MG CR tablet Take 1,300 mg by mouth every 8 (eight) hours as needed for pain.    Yes [provider]  amLODipine (NORVASC) 5 MG tablet Take 1 tablet (5 mg total) by mouth daily. 03/26/20  Yes Lindell Spar, MD  atorvastatin (LIPITOR) 10 MG tablet Take 1 tablet (10 mg total) by mouth daily. 03/26/20  Yes Lindell Spar, MD  Cholecalciferol (VITAMIN D) 125 MCG (5000 UT) CAPS Take 5,000 Units by mouth daily.    Yes [provider]  DULoxetine (CYMBALTA) 60 MG capsule TAKE 1 CAPSULE EVERY DAY 01/07/20  Yes [name], MD  famotidine (PEPCID) 20 MG tablet Take 1 tablet (20 mg total) by mouth daily. 03/26/20  Yes Patel, Colin Broach, MD  guaiFENesin-dextromethorphan (ROBITUSSIN DM) 100-10 MG/5ML syrup Take 5 mLs by mouth every 4 (four) hours as needed for cough. 12/12/19  Yes Swayze, Ava, DO  hydrOXYzine (VISTARIL) 50 MG capsule TAKE 1 TO 2 CAPSULES BY MOUTH AT BEDTIME FOR SLEEP 03/10/20  Yes [name], MD  levothyroxine (SYNTHROID) 75 MCG tablet TAKE 1 TABLET EVERY OTHER DAY ALTERNATING WITH 1/2 TABLET EVERY OTHER DAY 03/26/20  Yes  Lindell Spar, MD  Lidocaine-Menthol 4-1 % [facility] Apply 1 patch topically daily as needed (pain).   Yes [provider]  magnesium oxide (MAG-OX) 400 (241.3 Mg) MG tablet Take 1 tablet (400 mg total) by mouth 2 (two) times daily. 03/26/20  Yes Lindell Spar, MD  mirtazapine (REMERON) 7.5 MG tablet Take 1 tablet (7.5 mg total) by mouth at bedtime. 03/26/20  Yes Lindell Spar, MD  montelukast (SINGULAIR) 10 MG tablet Take 1 tablet (10 mg total) by mouth at bedtime. 03/26/20  Yes Lindell Spar, MD  Multiple Vitamin (MULTIVITAMIN WITH MINERALS) TABS tablet Take 1 tablet by mouth daily.   Yes [provider]  PROAIR RESPICLICK 341 (90 Base) MCG/ACT AEPB Inhale 2 puffs into the lungs 4 (four) times daily as needed. 08/07/18  Yes Collene Gobble, MD    Allergies    Aspirin  Review of Systems   Review of Systems  Constitutional: Negative for fever.  HENT: Negative for rhinorrhea and sore throat.   Eyes: Negative for redness.  Respiratory: Negative for cough.   Cardiovascular: Negative for chest pain.  Gastrointestinal: Positive for abdominal pain, nausea and vomiting. Negative for diarrhea.  Genitourinary: Negative for dysuria, frequency, hematuria and urgency.  Musculoskeletal: Negative for myalgias.  Skin: Negative for rash.  Neurological: Negative for headaches.    Physical Exam Updated Vital Signs BP (!) 146/102 (BP Location: Left Arm)   Pulse (!) 117   Temp 97.8 F (36.6 C) (Oral)   Resp (!) 24   Ht 5\' 8"  (1.727 m)   Wt 58.1 kg   LMP  (LMP Unknown)   SpO2 100%   BMI 19.46 kg/m   Physical Exam Vitals and nursing note reviewed.  Constitutional:      General: She is not in acute distress.    Appearance: She is well-developed.  HENT:     Head: Normocephalic and atraumatic.     Right Ear: External ear normal.     Left Ear: External ear normal.     Nose: Nose normal.  Eyes:     Conjunctiva/sclera: Conjunctivae normal.  Cardiovascular:     Rate and  Rhythm: Regular rhythm. Tachycardia present.  Heart sounds: No murmur heard.   Pulmonary:     Effort: No respiratory distress.     Breath sounds: No wheezing, rhonchi or rales.  Abdominal:     Palpations: Abdomen is soft.     Tenderness: There is generalized abdominal tenderness (Moderate generalized abdominal tenderness without rebound or guarding). There is no guarding or rebound.     Hernia: No hernia is present.  Musculoskeletal:     Cervical back: Normal range of motion and neck supple.     Right lower leg: No edema.     Left lower leg: No edema.  Skin:    General: Skin is warm and dry.     Findings: No rash.  Neurological:     General: No focal deficit present.     Mental Status: She is alert. Mental status is at baseline.     Motor: No weakness.  Psychiatric:        Mood and Affect: Mood normal.     ED Results / Procedures / Treatments   Labs (all labs ordered are listed, but only abnormal results are displayed) Labs Reviewed  CBC WITH DIFFERENTIAL/PLATELET - Abnormal; Notable for the following components:      Result Value   WBC 13.5 (*)    MCH 25.8 (*)    Platelets 439 (*)    Neutro Abs 10.5 (*)    All other components within normal limits  COMPREHENSIVE METABOLIC PANEL - Abnormal; Notable for the following components:   Chloride 94 (*)    CO2 20 (*)    Glucose, Bld 152 (*)    BUN 7 (*)    Calcium 10.4 (*)    Total Protein 9.2 (*)    Anion gap 21 (*)    All other components within normal limits  LIPASE, BLOOD - Abnormal; Notable for the following components:   Lipase 57 (*)    All other components within normal limits  MAGNESIUM  URINALYSIS, ROUTINE W REFLEX MICROSCOPIC  TROPONIN I (HIGH SENSITIVITY)    EKG EKG Interpretation  Date/Time:  Saturday April 18 2020 21:24:54 EDT Ventricular Rate:  126 PR Interval:    QRS Duration: 104 QT Interval:  255 QTC Calculation: 371 R Axis:   84 Text Interpretation: Sinus tachycardia Borderline right axis  deviation Non-specific ST-t changes Artifact Poor data quality Non-specific ST-t changes Confirmed by Lajean Saver [phone]) on 04/18/2020 9:29:30 PM   Radiology CT ABDOMEN PELVIS W CONTRAST  Result Date: 04/18/2020 CLINICAL DATA:  65 year old female with abdominal pain. EXAM: CT ABDOMEN AND PELVIS WITH CONTRAST TECHNIQUE: Multidetector CT imaging of the abdomen and pelvis was performed using the standard protocol following bolus administration of intravenous contrast. CONTRAST:  65mL OMNIPAQUE IOHEXOL 300 MG/ML  SOLN COMPARISON:  CT abdomen pelvis dated 02/23/2020. FINDINGS: Lower chest: There is a 17 mm nodular density in the left lower lobe, likely infectious in etiology. A neoplastic process is less likely but not excluded. Clinical correlation and follow-up after treatment recommended to document resolution. Chest CT may provide better evaluation of the lungs. No intra-abdominal free air or free fluid. Hepatobiliary: No focal liver abnormality is seen. No gallstones, gallbladder wall thickening, or biliary dilatation. Pancreas: Unremarkable. No pancreatic ductal dilatation or surrounding inflammatory changes. Spleen: Normal in size without focal abnormality. Adrenals/Urinary Tract: The adrenal glands unremarkable. There is no hydronephrosis on either side. There is symmetric enhancement and excretion of contrast by both kidneys. The visualized ureters and urinary bladder appear unremarkable. Stomach/Bowel: There is moderate stool throughout  the colon. There is no bowel obstruction or active inflammation. Appendectomy. Vascular/Lymphatic: Moderate aortoiliac atherosclerotic disease. The IVC is unremarkable. No portal venous gas. There is no adenopathy. Reproductive: The uterus is grossly unremarkable. No adnexal masses. Other: None Musculoskeletal: Degenerative changes of the spine. No acute osseous pathology. IMPRESSION: 1. No acute intra-abdominal or pelvic pathology. 2. A 17 mm nodular density in the left  lower lobe, likely infectious in etiology. A neoplastic process is less likely but not excluded. Chest CT may provide better evaluation of the lungs. 3. Aortic Atherosclerosis (ICD10-I70.0). Electronically Signed   By: Anner Crete M.D.   On: 04/18/2020 22:23    Procedures Procedures   Medications Ordered in ED Medications  alum & mag hydroxide-simeth (MAALOX/MYLANTA) 200-200-20 MG/5ML suspension 30 mL (has no administration in time range)    And  lidocaine (XYLOCAINE) 2 % viscous mouth solution 15 mL (has no administration in time range)  famotidine (PEPCID) tablet 20 mg (has no administration in time range)  HYDROmorphone (DILAUDID) injection 0.5 mg (0.5 mg Intravenous Given 04/18/20 2122)  ondansetron (ZOFRAN) injection 4 mg (4 mg Intravenous Given 04/18/20 2121)  iohexol (OMNIPAQUE) 300 MG/ML solution 75 mL (75 mLs Intravenous Contrast Given 04/18/20 2149)    ED Course  I have reviewed the triage vital signs and the nursing notes.  Pertinent labs & imaging results that were available during my care of the patient were reviewed by me and considered in my medical decision making (see chart for details).  Patient seen and examined. Work-up initiated. Medications ordered.   Vital signs reviewed and are as follows: BP 127/87 (BP Location: Left Arm)   Pulse (!) 104   Temp 98.8 F (37.1 C) (Oral)   Resp 16   Ht 5\' 8"  (1.727 m)   Wt 58.1 kg   LMP  (LMP Unknown)   SpO2 98%   BMI 19.46 kg/m   10:35 PM CT reviewed, no acute findings.  Patient does have a pulmonary nodule, which she knows about is cancerous.  He states there are plans to remove this.  She does look a lot more comfortable now.  She complains of some heartburn pain which she would like to have treatment for prior to discharge.  GI cocktail ordered.  Will give dose of Pepcid as well.  11:01 PM Signout to Dr. Betsey Holiday at shift change. She will need rechecked. If doing well, can go home. Rx: zofran.     MDM  Rules/Calculators/A&P                          Patient with abdominal pain, N/V -- better controlled after treatment in the ED. Vitals are stable, no fever. Labs WBC elevation, slight lipase elevation. Imaging CT without acute findings, known pulmonary nodule. She looks better now.  She is tolerating PO's. Lungs are clear and no signs suggestive of PNA. Low concern for appendicitis, cholecystitis, pancreatitis, ruptured viscus, UTI, kidney stone, aortic dissection, aortic aneurysm or other emergent abdominal etiology. Supportive therapy indicated with return if symptoms worsen.     Final Clinical Impression(s) / ED Diagnoses Final diagnoses:  Generalized abdominal pain  Non-intractable vomiting with nausea, unspecified vomiting type    Rx / DC Orders ED Discharge Orders         Ordered    ondansetron (ZOFRAN ODT) 4 MG disintegrating tablet  Every 8 hours PRN        04/18/20 2302  Carlisle Cater, PA-C 04/18/20 2310    Lajean Saver, MD 04/18/20 2330

## 2020-04-18 NOTE — Discharge Instructions (Addendum)
It was our pleasure to provide your ER care today - we hope that you feel better.  Take acetaminophen as need. You may also try pepcid, maalox or gas-x for symptom relief.   Your ct scan showed no acute intraabdominal process - note was made of a lung nodule: IMPRESSION:  1. No acute intra-abdominal or pelvic pathology.  2. A 17 mm nodular density in the left lower lobe  For the left lung nodule noted, you must follow up with primary care doctor in the coming month - call office Monday for appointment - have them review your scan and arrange further imaging/chest ct scan, and further evaluation as an outpatient.   Return to ER if worse, new symptoms, fevers, new, worsening, or severe pain, persistent vomiting, chest pain, trouble breathing, or other concern.

## 2020-04-18 NOTE — ED Triage Notes (Signed)
Pt c/o abd pain for past 2 days and vomiting today. Pt states she has been having problems since they changed her GERD medication in February.

## 2020-04-18 NOTE — ED Notes (Signed)
Pt states that her abd pain is much better, states that now she is having some heart burn.

## 2020-04-19 NOTE — ED Provider Notes (Signed)
Patient signed out to me to follow-up after treatment for GERD symptoms.  Patient was hospitalized 1 month ago with recurrent hypomagnesemia and her PPI was stopped as it was felt to be the causal agent.  She has been having daily reflux symptoms since that time.  She is feeling better after treatment here in the emergency department.  She is currently on Pepcid.  Although she was well controlled on her PPI, it appears that it cannot be restarted because of the deleterious effects.  Did discuss with her the possibility of this being rebound after cessation and might improve.  Continue Pepcid and add liquid antacid before meals.   Orpah Greek, MD 04/19/20 [phone]

## 2020-04-20 ENCOUNTER — Other Ambulatory Visit: Payer: Self-pay | Admitting: Internal Medicine

## 2020-04-20 ENCOUNTER — Other Ambulatory Visit: Payer: Self-pay | Admitting: Thoracic Surgery (Cardiothoracic Vascular Surgery)

## 2020-04-20 ENCOUNTER — Other Ambulatory Visit: Payer: Self-pay | Admitting: *Deleted

## 2020-04-20 ENCOUNTER — Telehealth: Payer: Self-pay | Admitting: *Deleted

## 2020-04-20 DIAGNOSIS — R911 Solitary pulmonary nodule: Secondary | ICD-10-CM

## 2020-04-20 NOTE — Telephone Encounter (Signed)
Patients niece called to let us know the patient has not quit smoking like Dr. Kipp Brood recommended.  After speaking with Dr. Kipp Brood, we plan to cancel surgery scheduled for 4/13 and patient will call me back once she has quit completely.  After that, we will get her re-scheduled ASAP.  They understand and have no further questions.

## 2020-04-22 ENCOUNTER — Telehealth (HOSPITAL_COMMUNITY): Payer: Self-pay

## 2020-04-22 DIAGNOSIS — F32A Depression, unspecified: Secondary | ICD-10-CM | POA: Diagnosis not present

## 2020-04-22 DIAGNOSIS — I2510 Atherosclerotic heart disease of native coronary artery without angina pectoris: Secondary | ICD-10-CM | POA: Diagnosis not present

## 2020-04-22 DIAGNOSIS — M349 Systemic sclerosis, unspecified: Secondary | ICD-10-CM | POA: Diagnosis not present

## 2020-04-22 DIAGNOSIS — C3490 Malignant neoplasm of unspecified part of unspecified bronchus or lung: Secondary | ICD-10-CM | POA: Diagnosis not present

## 2020-04-22 DIAGNOSIS — I10 Essential (primary) hypertension: Secondary | ICD-10-CM | POA: Diagnosis not present

## 2020-04-22 DIAGNOSIS — J42 Unspecified chronic bronchitis: Secondary | ICD-10-CM | POA: Diagnosis not present

## 2020-04-22 DIAGNOSIS — N179 Acute kidney failure, unspecified: Secondary | ICD-10-CM | POA: Diagnosis not present

## 2020-04-22 DIAGNOSIS — F419 Anxiety disorder, unspecified: Secondary | ICD-10-CM | POA: Diagnosis not present

## 2020-04-22 DIAGNOSIS — E871 Hypo-osmolality and hyponatremia: Secondary | ICD-10-CM | POA: Diagnosis not present

## 2020-04-22 NOTE — Telephone Encounter (Signed)
Close encounter 

## 2020-04-24 ENCOUNTER — Ambulatory Visit (HOSPITAL_COMMUNITY)
Admission: RE | Admit: 2020-04-24 | Discharge: 2020-04-24 | Disposition: A | Discharge status: Home / Self Care | Payer: Medicare HMO | Source: Ambulatory Visit | Attending: Cardiovascular Disease | Admitting: Cardiovascular Disease

## 2020-04-24 ENCOUNTER — Ambulatory Visit
Admission: RE | Admit: 2020-04-24 | Discharge: 2020-04-24 | Disposition: A | Discharge status: Home / Self Care | Payer: Medicare HMO | Source: Ambulatory Visit | Attending: Thoracic Surgery (Cardiothoracic Vascular Surgery) | Admitting: Thoracic Surgery (Cardiothoracic Vascular Surgery)

## 2020-04-24 ENCOUNTER — Other Ambulatory Visit: Payer: Self-pay

## 2020-04-24 DIAGNOSIS — Z01810 Encounter for preprocedural cardiovascular examination: Secondary | ICD-10-CM

## 2020-04-24 DIAGNOSIS — I10 Essential (primary) hypertension: Secondary | ICD-10-CM | POA: Insufficient documentation

## 2020-04-24 DIAGNOSIS — I2510 Atherosclerotic heart disease of native coronary artery without angina pectoris: Secondary | ICD-10-CM | POA: Diagnosis not present

## 2020-04-24 DIAGNOSIS — C3492 Malignant neoplasm of unspecified part of left bronchus or lung: Secondary | ICD-10-CM | POA: Diagnosis not present

## 2020-04-24 DIAGNOSIS — J984 Other disorders of lung: Secondary | ICD-10-CM | POA: Diagnosis not present

## 2020-04-24 DIAGNOSIS — J438 Other emphysema: Secondary | ICD-10-CM | POA: Diagnosis not present

## 2020-04-24 DIAGNOSIS — E785 Hyperlipidemia, unspecified: Secondary | ICD-10-CM | POA: Insufficient documentation

## 2020-04-24 DIAGNOSIS — K219 Gastro-esophageal reflux disease without esophagitis: Secondary | ICD-10-CM | POA: Diagnosis not present

## 2020-04-24 DIAGNOSIS — J8410 Pulmonary fibrosis, unspecified: Secondary | ICD-10-CM | POA: Diagnosis not present

## 2020-04-24 DIAGNOSIS — I252 Old myocardial infarction: Secondary | ICD-10-CM | POA: Insufficient documentation

## 2020-04-24 LAB — MYOCARDIAL PERFUSION IMAGING
LV dias vol: 77 mL (ref 46–106)
LV sys vol: 27 mL
Peak HR: 103 {beats}/min
Rest HR: 79 {beats}/min
SDS: 1
SRS: 0
SSS: 1
TID: 0.96

## 2020-04-24 MED ORDER — TECHNETIUM TC 99M TETROFOSMIN IV KIT
9.9000 | PACK | Freq: Once | INTRAVENOUS | Status: AC | PRN
  Administered 2020-04-24: 9.9 via INTRAVENOUS
  Filled 2020-04-24: qty 10

## 2020-04-24 MED ORDER — TECHNETIUM TC 99M TETROFOSMIN IV KIT
31.3000 | PACK | Freq: Once | INTRAVENOUS | Status: AC | PRN
  Administered 2020-04-24: 31.3 via INTRAVENOUS
  Filled 2020-04-24: qty 32

## 2020-04-24 MED ORDER — REGADENOSON 0.4 MG/5ML IV SOLN
0.4000 mg | Freq: Once | INTRAVENOUS | Status: AC
  Administered 2020-04-24: 0.4 mg via INTRAVENOUS

## 2020-04-27 ENCOUNTER — Other Ambulatory Visit (HOSPITAL_COMMUNITY): Payer: Medicare HMO

## 2020-04-29 ENCOUNTER — Encounter (HOSPITAL_COMMUNITY): Admission: RE | Payer: Self-pay | Source: Home / Self Care

## 2020-04-29 ENCOUNTER — Inpatient Hospital Stay (HOSPITAL_COMMUNITY)
Admission: RE | Admit: 2020-04-29 | Payer: Medicare HMO | Source: Home / Self Care | Admitting: Thoracic Surgery (Cardiothoracic Vascular Surgery)

## 2020-04-29 SURGERY — LOBECTOMY, LUNG, ROBOT-ASSISTED, USING VATS
Anesthesia: General | Site: Chest | Laterality: Left

## 2020-05-07 ENCOUNTER — Emergency Department (HOSPITAL_COMMUNITY): Payer: Medicare HMO

## 2020-05-07 ENCOUNTER — Encounter (HOSPITAL_COMMUNITY): Payer: Self-pay

## 2020-05-07 ENCOUNTER — Other Ambulatory Visit: Payer: Self-pay

## 2020-05-07 ENCOUNTER — Emergency Department (HOSPITAL_COMMUNITY)
Admission: EM | Admit: 2020-05-07 | Discharge: 2020-05-07 | Disposition: A | Discharge status: Home / Self Care | Payer: Medicare HMO | Attending: Emergency Medicine | Admitting: Emergency Medicine

## 2020-05-07 ENCOUNTER — Ambulatory Visit: Payer: Medicare HMO | Admitting: Internal Medicine

## 2020-05-07 DIAGNOSIS — R110 Nausea: Secondary | ICD-10-CM | POA: Diagnosis not present

## 2020-05-07 DIAGNOSIS — R911 Solitary pulmonary nodule: Secondary | ICD-10-CM | POA: Diagnosis not present

## 2020-05-07 DIAGNOSIS — R0902 Hypoxemia: Secondary | ICD-10-CM | POA: Diagnosis not present

## 2020-05-07 DIAGNOSIS — J189 Pneumonia, unspecified organism: Secondary | ICD-10-CM | POA: Diagnosis not present

## 2020-05-07 DIAGNOSIS — I708 Atherosclerosis of other arteries: Secondary | ICD-10-CM | POA: Diagnosis not present

## 2020-05-07 DIAGNOSIS — Z79899 Other long term (current) drug therapy: Secondary | ICD-10-CM | POA: Insufficient documentation

## 2020-05-07 DIAGNOSIS — R079 Chest pain, unspecified: Secondary | ICD-10-CM | POA: Diagnosis not present

## 2020-05-07 DIAGNOSIS — R109 Unspecified abdominal pain: Secondary | ICD-10-CM

## 2020-05-07 DIAGNOSIS — R000 Tachycardia, unspecified: Secondary | ICD-10-CM | POA: Diagnosis not present

## 2020-05-07 DIAGNOSIS — Z85118 Personal history of other malignant neoplasm of bronchus and lung: Secondary | ICD-10-CM | POA: Insufficient documentation

## 2020-05-07 DIAGNOSIS — K219 Gastro-esophageal reflux disease without esophagitis: Secondary | ICD-10-CM | POA: Diagnosis not present

## 2020-05-07 DIAGNOSIS — N3289 Other specified disorders of bladder: Secondary | ICD-10-CM | POA: Diagnosis not present

## 2020-05-07 DIAGNOSIS — F17210 Nicotine dependence, cigarettes, uncomplicated: Secondary | ICD-10-CM | POA: Insufficient documentation

## 2020-05-07 DIAGNOSIS — R1110 Vomiting, unspecified: Secondary | ICD-10-CM | POA: Diagnosis not present

## 2020-05-07 DIAGNOSIS — E039 Hypothyroidism, unspecified: Secondary | ICD-10-CM | POA: Diagnosis not present

## 2020-05-07 DIAGNOSIS — K449 Diaphragmatic hernia without obstruction or gangrene: Secondary | ICD-10-CM | POA: Diagnosis not present

## 2020-05-07 DIAGNOSIS — J449 Chronic obstructive pulmonary disease, unspecified: Secondary | ICD-10-CM | POA: Diagnosis not present

## 2020-05-07 DIAGNOSIS — R1033 Periumbilical pain: Secondary | ICD-10-CM | POA: Insufficient documentation

## 2020-05-07 DIAGNOSIS — R0689 Other abnormalities of breathing: Secondary | ICD-10-CM | POA: Diagnosis not present

## 2020-05-07 DIAGNOSIS — R112 Nausea with vomiting, unspecified: Secondary | ICD-10-CM | POA: Diagnosis not present

## 2020-05-07 DIAGNOSIS — R1013 Epigastric pain: Secondary | ICD-10-CM | POA: Diagnosis not present

## 2020-05-07 DIAGNOSIS — R1084 Generalized abdominal pain: Secondary | ICD-10-CM | POA: Diagnosis not present

## 2020-05-07 DIAGNOSIS — R100 Acute abdomen: Secondary | ICD-10-CM | POA: Diagnosis not present

## 2020-05-07 DIAGNOSIS — I10 Essential (primary) hypertension: Secondary | ICD-10-CM | POA: Diagnosis not present

## 2020-05-07 LAB — CBC WITH DIFFERENTIAL/PLATELET
Abs Immature Granulocytes: 0.02 10*3/uL (ref 0.00–0.07)
Abs Immature Granulocytes: 0.03 10*3/uL (ref 0.00–0.07)
Basophils Absolute: 0 10*3/uL (ref 0.0–0.1)
Basophils Absolute: 0 10*3/uL (ref 0.0–0.1)
Basophils Relative: 0 %
Basophils Relative: 0 %
Eosinophils Absolute: 0 10*3/uL (ref 0.0–0.5)
Eosinophils Absolute: 0 10*3/uL (ref 0.0–0.5)
Eosinophils Relative: 0 %
Eosinophils Relative: 0 %
HCT: 13.6 % — ABNORMAL LOW (ref 36.0–46.0)
HCT: 35.1 % — ABNORMAL LOW (ref 36.0–46.0)
Hemoglobin: 4.2 g/dL — CL (ref 12.0–15.0)
Hemoglobin: 11.4 g/dL — ABNORMAL LOW (ref 12.0–15.0)
Immature Granulocytes: 1 %
Immature Granulocytes: 0 %
Lymphocytes Relative: 16 %
Lymphocytes Relative: 13 %
Lymphs Abs: 0.7 10*3/uL (ref 0.7–4.0)
Lymphs Abs: 1.5 10*3/uL (ref 0.7–4.0)
MCH: 26.6 pg (ref 26.0–34.0)
MCH: 26.2 pg (ref 26.0–34.0)
MCHC: 30.9 g/dL (ref 30.0–36.0)
MCHC: 32.5 g/dL (ref 30.0–36.0)
MCV: 86.1 fL (ref 80.0–100.0)
MCV: 80.7 fL (ref 80.0–100.0)
Monocytes Absolute: 0.1 10*3/uL (ref 0.1–1.0)
Monocytes Absolute: 0.5 10*3/uL (ref 0.1–1.0)
Monocytes Relative: 3 %
Monocytes Relative: 4 %
Neutro Abs: 3.3 10*3/uL (ref 1.7–7.7)
Neutro Abs: 9.2 10*3/uL — ABNORMAL HIGH (ref 1.7–7.7)
Neutrophils Relative %: 80 %
Neutrophils Relative %: 83 %
Platelets: 147 10*3/uL — ABNORMAL LOW (ref 150–400)
Platelets: 474 10*3/uL — ABNORMAL HIGH (ref 150–400)
RBC: 1.58 MIL/uL — ABNORMAL LOW (ref 3.87–5.11)
RBC: 4.35 MIL/uL (ref 3.87–5.11)
RDW: 14.9 % (ref 11.5–15.5)
RDW: 14.8 % (ref 11.5–15.5)
WBC: 4.1 10*3/uL (ref 4.0–10.5)
WBC: 11.3 10*3/uL — ABNORMAL HIGH (ref 4.0–10.5)
nRBC: 0 % (ref 0.0–0.2)
nRBC: 0 % (ref 0.0–0.2)

## 2020-05-07 LAB — URINALYSIS, ROUTINE W REFLEX MICROSCOPIC
Bilirubin Urine: NEGATIVE
Glucose, UA: 50 mg/dL — AB
Hgb urine dipstick: NEGATIVE
Ketones, ur: NEGATIVE mg/dL
Leukocytes,Ua: NEGATIVE
Nitrite: NEGATIVE
Protein, ur: NEGATIVE mg/dL
Specific Gravity, Urine: 1.005 (ref 1.005–1.030)
pH: 9 — ABNORMAL HIGH (ref 5.0–8.0)

## 2020-05-07 LAB — COMPREHENSIVE METABOLIC PANEL
ALT: 13 U/L (ref 0–44)
AST: 21 U/L (ref 15–41)
Albumin: 4.2 g/dL (ref 3.5–5.0)
Alkaline Phosphatase: 80 U/L (ref 38–126)
Anion gap: 11 (ref 5–15)
BUN: <5 mg/dL — ABNORMAL LOW (ref 8–23)
CO2: 29 mmol/L (ref 22–32)
Calcium: 9.3 mg/dL (ref 8.9–10.3)
Chloride: 99 mmol/L (ref 98–111)
Creatinine, Ser: 0.79 mg/dL (ref 0.44–1.00)
GFR, Estimated: >60 mL/min (ref >60)
Glucose, Bld: 134 mg/dL — ABNORMAL HIGH (ref 70–99)
Potassium: 3.1 mmol/L — ABNORMAL LOW (ref 3.5–5.1)
Sodium: 139 mmol/L (ref 135–145)
Total Bilirubin: 0.6 mg/dL (ref 0.3–1.2)
Total Protein: 7.8 g/dL (ref 6.5–8.1)

## 2020-05-07 LAB — MAGNESIUM: Magnesium: 1.7 mg/dL (ref 1.7–2.4)

## 2020-05-07 LAB — LIPASE, BLOOD: Lipase: 22 U/L (ref 11–51)

## 2020-05-07 MED ORDER — MORPHINE SULFATE (PF) 4 MG/ML IV SOLN
4.0000 mg | Freq: Once | INTRAVENOUS | Status: AC
  Administered 2020-05-07: 4 mg via INTRAVENOUS
  Filled 2020-05-07: qty 1

## 2020-05-07 MED ORDER — IOHEXOL 300 MG/ML  SOLN
100.0000 mL | Freq: Once | INTRAMUSCULAR | Status: AC | PRN
  Administered 2020-05-07: 100 mL via INTRAVENOUS

## 2020-05-07 MED ORDER — LORAZEPAM 2 MG/ML IJ SOLN
1.0000 mg | Freq: Once | INTRAMUSCULAR | Status: AC
  Administered 2020-05-07: 1 mg via INTRAVENOUS
  Filled 2020-05-07: qty 1

## 2020-05-07 MED ORDER — LEVOFLOXACIN 750 MG PO TABS: 750.0000 mg | ORAL_TABLET | Freq: Every day | ORAL | 0 refills | Status: AC

## 2020-05-07 MED ORDER — LIDOCAINE VISCOUS HCL 2 % MT SOLN
15.0000 mL | Freq: Once | OROMUCOSAL | Status: AC
  Administered 2020-05-07: 15 mL via ORAL
  Filled 2020-05-07: qty 15

## 2020-05-07 MED ORDER — SODIUM CHLORIDE 0.9 % IV BOLUS
1000.0000 mL | Freq: Once | INTRAVENOUS | Status: AC
  Administered 2020-05-07: 1000 mL via INTRAVENOUS

## 2020-05-07 MED ORDER — ALUM & MAG HYDROXIDE-SIMETH 200-200-20 MG/5ML PO SUSP
30.0000 mL | Freq: Once | ORAL | Status: AC
  Administered 2020-05-07: 30 mL via ORAL
  Filled 2020-05-07: qty 30

## 2020-05-07 MED ORDER — ONDANSETRON HCL 4 MG PO TABS: 4.0000 mg | ORAL_TABLET | Freq: Three times a day (TID) | ORAL | 0 refills | Status: DC | PRN

## 2020-05-07 MED ORDER — PANTOPRAZOLE SODIUM 20 MG PO TBEC: 20.0000 mg | DELAYED_RELEASE_TABLET | Freq: Every day | ORAL | 0 refills | Status: DC

## 2020-05-07 NOTE — ED Provider Notes (Signed)
  Face-to-face evaluation   History: She presents for evaluation of "heartburn," which is causing vomiting and upper abdominal pain.  She denies back pain, fever, chills, shortness of breath or chest pain.  She has had this previously.  She is asking for "pain medicine."  Physical exam: Alert elderly appearing female.  No respiratory distress.  She is hunched forward, with her chest over her knees, due to her pain.  Her back is nontender to palpation.  There is no respiratory distress.  Medical screening examination/treatment/procedure(s) were conducted as a shared visit with non-physician practitioner(s) and myself.  I personally evaluated the patient during the encounter    Daleen Bo, MD 05/08/20 [phone]

## 2020-05-07 NOTE — ED Triage Notes (Signed)
Pt presents to ED via [facility] for abdominal pain since last night. Pt states she has had dry heaves since last night. Pt c/o mid abd pain. Pt had zofran 4 mg PTA

## 2020-05-07 NOTE — ED Notes (Signed)
Patient transported to CT 
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
## 2020-05-07 NOTE — Discharge Instructions (Addendum)
Continue taking medications as prescribed. Take antibiotics as prescribed Zofran as needed for nausea or vomiting. Take Protonix daily to help with heartburn symptoms. Make sure you are staying well-hydrated with water. It is extremely important you follow-up with your primary care doctor for recheck of your symptoms. Return to the emergency room with any new, worsening, concerning symptoms

## 2020-05-07 NOTE — ED Notes (Signed)
Date and time results received: 05/07/20 11:39 AM  Test: hgb Critical Value: 4.2  Name of Provider Notified: caccavale, PA  Orders Received? Or Actions Taken?: pa notified

## 2020-05-08 ENCOUNTER — Other Ambulatory Visit: Payer: Self-pay | Admitting: Family Medicine

## 2020-05-11 ENCOUNTER — Other Ambulatory Visit: Payer: Self-pay | Admitting: Family Medicine

## 2020-05-11 DIAGNOSIS — E782 Mixed hyperlipidemia: Secondary | ICD-10-CM

## 2020-05-13 ENCOUNTER — Other Ambulatory Visit: Payer: Self-pay | Admitting: Internal Medicine

## 2020-05-14 ENCOUNTER — Ambulatory Visit: Payer: Medicare HMO | Admitting: Family Medicine

## 2020-05-14 ENCOUNTER — Ambulatory Visit (HOSPITAL_COMMUNITY): Payer: Medicare HMO | Admitting: Hematology

## 2020-05-25 ENCOUNTER — Encounter: Payer: Self-pay | Admitting: Family Medicine

## 2020-05-25 ENCOUNTER — Ambulatory Visit (INDEPENDENT_AMBULATORY_CARE_PROVIDER_SITE_OTHER): Payer: Medicare HMO | Admitting: Family Medicine

## 2020-05-25 ENCOUNTER — Other Ambulatory Visit: Payer: Self-pay

## 2020-05-25 VITALS — BP 111/66 | HR 62 | Temp 97.5°F | Ht 68.0 in | Wt 127.0 lb

## 2020-05-25 DIAGNOSIS — E782 Mixed hyperlipidemia: Secondary | ICD-10-CM | POA: Diagnosis not present

## 2020-05-25 DIAGNOSIS — N179 Acute kidney failure, unspecified: Secondary | ICD-10-CM

## 2020-05-25 DIAGNOSIS — Z9109 Other allergy status, other than to drugs and biological substances: Secondary | ICD-10-CM

## 2020-05-25 DIAGNOSIS — J189 Pneumonia, unspecified organism: Secondary | ICD-10-CM

## 2020-05-25 DIAGNOSIS — I272 Pulmonary hypertension, unspecified: Secondary | ICD-10-CM

## 2020-05-25 DIAGNOSIS — E876 Hypokalemia: Secondary | ICD-10-CM | POA: Diagnosis not present

## 2020-05-25 DIAGNOSIS — J449 Chronic obstructive pulmonary disease, unspecified: Secondary | ICD-10-CM

## 2020-05-25 DIAGNOSIS — R531 Weakness: Secondary | ICD-10-CM

## 2020-05-25 DIAGNOSIS — I10 Essential (primary) hypertension: Secondary | ICD-10-CM

## 2020-05-25 DIAGNOSIS — K219 Gastro-esophageal reflux disease without esophagitis: Secondary | ICD-10-CM | POA: Diagnosis not present

## 2020-05-25 DIAGNOSIS — F17218 Nicotine dependence, cigarettes, with other nicotine-induced disorders: Secondary | ICD-10-CM | POA: Diagnosis not present

## 2020-05-25 DIAGNOSIS — E038 Other specified hypothyroidism: Secondary | ICD-10-CM

## 2020-05-25 DIAGNOSIS — Z1231 Encounter for screening mammogram for malignant neoplasm of breast: Secondary | ICD-10-CM

## 2020-05-25 DIAGNOSIS — M349 Systemic sclerosis, unspecified: Secondary | ICD-10-CM

## 2020-05-25 MED ORDER — BUPROPION HCL ER (SR) 150 MG PO TB12: ORAL_TABLET | ORAL | 3 refills | Status: DC

## 2020-05-25 NOTE — Patient Instructions (Addendum)
F/U with mD in 2 to 3 weeks for pelvic and pap, call if you need me sooner  Chem 7 and EGFr and mG level today ' New to stop smoking is Wellbutrin  Take one tablet once daiy for 3 days , then one twice daily  Please schedule mammogram at checkout  Thanks for choosing [facility], we consider it a privelige to serve you.   Steps to Quit Smoking Smoking tobacco is the leading cause of preventable death. It can affect almost every organ in the body. Smoking puts you and people around you at risk for many serious, long-lasting (chronic) diseases. Quitting smoking can be hard, but it is one of the best things that you can do for your health. It is never too late to quit. How do I get ready to quit? When you decide to quit smoking, make a plan to help you succeed. Before you quit:  Pick a date to quit. Set a date within the next 2 weeks to give you time to prepare.  Write down the reasons why you are quitting. Keep this list in places where you will see it often.  Tell your family, friends, and co-workers that you are quitting. Their support is important.  Talk with your doctor about the choices that may help you quit.  Find out if your health insurance will pay for these treatments.  Know the people, places, things, and activities that make you want to smoke (triggers). Avoid them. What first steps can I take to quit smoking?  Throw away all cigarettes at home, at work, and in your car.  Throw away the things that you use when you smoke, such as ashtrays and lighters.  Clean your car. Make sure to empty the ashtray.  Clean your home, including curtains and carpets. What can I do to help me quit smoking? Talk with your doctor about taking medicines and seeing a counselor at the same time. You are more likely to succeed when you do both.  If you are pregnant or breastfeeding, talk with your doctor about counseling or other ways to quit smoking. Do not take medicine to help  you quit smoking unless your doctor tells you to do so. To quit smoking: Quit right away  Quit smoking totally, instead of slowly cutting back on how much you smoke over a period of time.  Go to counseling. You are more likely to quit if you go to counseling sessions regularly. Take medicine You may take medicines to help you quit. Some medicines need a prescription, and some you can buy over-the-counter. Some medicines may contain a drug called nicotine to replace the nicotine in cigarettes. Medicines may:  Help you to stop having the desire to smoke (cravings).  Help to stop the problems that come when you stop smoking (withdrawal symptoms). Your doctor may ask you to use:  Nicotine patches, gum, or lozenges.  Nicotine inhalers or sprays.  Non-nicotine medicine that is taken by mouth. Find resources Find resources and other ways to help you quit smoking and remain smoke-free after you quit. These resources are most helpful when you use them often. They include:  Online chats with a Social worker.  Phone quitlines.  Printed Furniture conservator/restorer.  Support groups or group counseling.  Text messaging programs.  Mobile phone apps. Use apps on your mobile phone or tablet that can help you stick to your quit plan. There are many free apps for mobile phones and tablets as well as websites.  Examples include Quit Guide from the [employer] and smokefree.gov   What things can I do to make it easier to quit?  Talk to your family and friends. Ask them to support and encourage you.  Call a phone quitline (1-800-QUIT-NOW), reach out to support groups, or work with a Social worker.  Ask people who smoke to not smoke around you.  Avoid places that make you want to smoke, such as: ? Bars. ? Parties. ? Smoke-break areas at work.  Spend time with people who do not smoke.  Lower the stress in your life. Stress can make you want to smoke. Try these things to help your stress: ? Getting regular  exercise. ? Doing deep-breathing exercises. ? Doing yoga. ? Meditating. ? Doing a body scan. To do this, close your eyes, focus on one area of your body at a time from head to toe. Notice which parts of your body are tense. Try to relax the muscles in those areas.   How will I feel when I quit smoking? Day 1 to 3 weeks Within the first 24 hours, you may start to have some problems that come from quitting tobacco. These problems are very bad 2-3 days after you quit, but they do not often last for more than 2-3 weeks. You may get these symptoms:  Mood swings.  Feeling restless, nervous, angry, or annoyed.  Trouble concentrating.  Dizziness.  Strong desire for high-sugar foods and nicotine.  Weight gain.  Trouble pooping (constipation).  Feeling like you may vomit (nausea).  Coughing or a sore throat.  Changes in how the medicines that you take for other issues work in your body.  Depression.  Trouble sleeping (insomnia). Week 3 and afterward After the first 2-3 weeks of quitting, you may start to notice more positive results, such as:  Better sense of smell and taste.  Less coughing and sore throat.  Slower heart rate.  Lower blood pressure.  Clearer skin.  Better breathing.  Fewer sick days. Quitting smoking can be hard. Do not give up if you fail the first time. Some people need to try a few times before they succeed. Do your best to stick to your quit plan, and talk with your doctor if you have any questions or concerns. Summary  Smoking tobacco is the leading cause of preventable death. Quitting smoking can be hard, but it is one of the best things that you can do for your health.  When you decide to quit smoking, make a plan to help you succeed.  Quit smoking right away, not slowly over a period of time.  When you start quitting, seek help from your doctor, family, or friends. This information is not intended to replace advice given to you by your health  care provider. Make sure you discuss any questions you have with your health care provider. Document Revised: 09/28/2018 Document Reviewed: 03/24/2018 Elsevier Patient Education  [place].

## 2020-05-27 ENCOUNTER — Inpatient Hospital Stay (HOSPITAL_COMMUNITY): Payer: Medicare HMO | Admitting: Hematology

## 2020-05-27 ENCOUNTER — Encounter: Payer: Self-pay | Admitting: Family Medicine

## 2020-05-27 DIAGNOSIS — I272 Pulmonary hypertension, unspecified: Secondary | ICD-10-CM | POA: Insufficient documentation

## 2020-05-27 LAB — MAGNESIUM: Magnesium: 1.5 mg/dL — ABNORMAL LOW (ref 1.6–2.3)

## 2020-05-27 LAB — BASIC METABOLIC PANEL
BUN/Creatinine Ratio: 8 — ABNORMAL LOW (ref 12–28)
BUN: 7 mg/dL — ABNORMAL LOW (ref 8–27)
CO2: 22 mmol/L (ref 20–29)
Calcium: 9.6 mg/dL (ref 8.7–10.3)
Chloride: 103 mmol/L (ref 96–106)
Creatinine, Ser: 0.89 mg/dL (ref 0.57–1.00)
Glucose: 84 mg/dL (ref 65–99)
Potassium: 3.9 mmol/L (ref 3.5–5.2)
Sodium: 143 mmol/L (ref 134–144)
eGFR: 72 mL/min/{1.73_m2} (ref >59)

## 2020-05-27 NOTE — Patient Instructions (Signed)
[facility] at [facility] Discharge Instructions  You were seen today by Dr. Katragadda. He went over your recent results. Dr. Katragadda will see you back in for labs and follow up.   Thank you for choosing [facility] at [facility] to provide your oncology and hematology care.  To afford each patient quality time with our provider, please arrive at least 15 minutes before your scheduled appointment time.   If you have a lab appointment with the Cancer Center please come in thru the Main Entrance and check in at the main information desk  You need to re-schedule your appointment should you arrive 10 or more minutes late.  We strive to give you quality time with our providers, and arriving late affects you and other patients whose appointments are after yours.  Also, if you no show three or more times for appointments you may be dismissed from the clinic at the providers discretion.     Again, thank you for choosing [facility].  Our hope is that these requests will decrease the amount of time that you wait before being seen by our physicians.       _____________________________________________________________  Should you have questions after your visit to [facility], please contact our office at [phone] between the hours of 8:00 a.m. and 4:30 p.m.  Voicemails left after 4:00 p.m. will not be returned until the following business day.  For prescription refill requests, have your pharmacy contact our office and allow 72 hours.    Cancer Center Support Programs:   > Cancer Support Group  2nd Tuesday of the month 1pm-2pm, Journey Room    

## 2020-05-27 NOTE — Assessment & Plan Note (Signed)
Hyperlipidemia:Low fat diet discussed and encouraged.   Lipid Panel  Lab Results  Component Value Date   CHOL 118 03/04/2020   HDL 36 (L) 03/04/2020   LDLCALC 62 03/04/2020   TRIG 105 03/04/2020   CHOLHDL 3.3 03/04/2020     \no med change

## 2020-05-27 NOTE — Assessment & Plan Note (Signed)
Controlled, no change in medication  

## 2020-05-27 NOTE — Progress Notes (Signed)
   Kelly Douglas     MRN: [id number]      DOB: [birth year]   HPI Kelly Douglas is here for follow up and re-evaluation of chronic medical conditions, medication management and review of any available recent lab and radiology data.  Preventive health is updated, specifically  Cancer screening and Immunization.   Needs to quit smoking prior to lung surgery and is working on this. The PT denies any adverse reactions to current medications since the last visit.  Appetite and sleep are fair and she denies depression  Recent cXR suggested pneumonia, currently asymptomatic, has know lung cancer , will ret in next several weeks  ROS Denies recent fever or chills. Denies sinus pressure,, ear pain or sore throat.c/o increased nasal congestion Smoker's cough cghronic. Denies chest pains, palpitations and leg swelling Denies abdominal pain, nausea, vomiting,diarrhea or constipation.   Denies dysuria, frequency, hesitancy or incontinence. Denies joint pain, swelling and limitation in mobility. Denies headaches, seizures, numbness, or tingling. Denies depression, anxiety or insomnia. Denies skin break down or rash.   PE  BP 111/66 (BP Location: Right Arm, Patient Position: Sitting, Cuff Size: Normal)   Pulse 62   Temp (!) 97.5 F (36.4 C) (Temporal)   Ht $R'5\' 8"'HQ$  (1.727 m)   Wt 127 lb (57.6 kg)   LMP  (LMP Unknown)   SpO2 95%   BMI 19.31 kg/m   Patient alert and oriented and in no cardiopulmonary distress.  HEENT: No facial asymmetry, EOMI,     Neck supple .  Chest: Clear to auscultation bilaterally.decreased air entry CVS: S1, S2 no murmurs, no S3.Regular rate.  ABD: Soft non tender.   Ext: No edema  MS: Adequate ROM spine, shoulders, hips and knees.  Skin: Intact, no ulcerations or rash noted.  Psych: Good eye contact, normal affect. Memory intact not anxious or depressed appearing.  CNS: CN 2-12 intact, power,  normal throughout.no focal deficits noted.   Assessment &  Plan HTN (hypertension) Controlled, no change in medication DASH diet and commitment to daily physical activity for a minimum of 30 minutes discussed and encouraged, as a part of hypertension management. The importance of attaining a healthy weight is also discussed.  BP/Weight 05/25/2020 05/07/2020 04/24/2020 04/19/2020 04/18/2020 04/03/2020 0/35/4656  Systolic BP 812 751 - 700 - 174 944  Diastolic BP 66 967 - 74 - 70 72  Wt. (Lbs) 127 120 122 - 128 124 119.8  BMI 19.31 18.25 18.55 - 19.46 18.85 18.22       Nicotine dependence Asked:confirms currently smokes cigarettes Assess: Unwilling to set a quit date, but is cutting back ansd will start wellburin Advise: needs to QUIT to reduce risk of cancer, cardio and cerebrovascular disease Assist: counseled for 5 minutes and literature provided Arrange: follow up in 2 to 4 months   Hypothyroidism Controlled  On current med  GERD (gastroesophageal reflux disease) Controlled, no change in medication   Hyperlipidemia Hyperlipidemia:Low fat diet discussed and encouraged.   Lipid Panel  Lab Results  Component Value Date   CHOL 118 03/04/2020   HDL 36 (L) 03/04/2020   LDLCALC 62 03/04/2020   TRIG 105 03/04/2020   CHOLHDL 3.3 03/04/2020     \no med change  Pollen allergies Increased symptoms in Spring but adequate control

## 2020-05-27 NOTE — Assessment & Plan Note (Signed)
Controlled, no change in medication DASH diet and commitment to daily physical activity for a minimum of 30 minutes discussed and encouraged, as a part of hypertension management. The importance of attaining a healthy weight is also discussed.  BP/Weight 05/25/2020 05/07/2020 04/24/2020 04/19/2020 04/18/2020 04/03/2020 04/26/7351  Systolic BP 299 242 - 683 - 419 622  Diastolic BP 66 297 - 74 - 70 72  Wt. (Lbs) 127 120 122 - 128 124 119.8  BMI 19.31 18.25 18.55 - 19.46 18.85 18.22

## 2020-05-27 NOTE — Assessment & Plan Note (Signed)
Asked:confirms currently smokes cigarettes Assess: Unwilling to set a quit date, but is cutting back ansd will start wellburin Advise: needs to QUIT to reduce risk of cancer, cardio and cerebrovascular disease Assist: counseled for 5 minutes and literature provided Arrange: follow up in 2 to 4 months

## 2020-05-27 NOTE — Assessment & Plan Note (Signed)
Increased symptoms in Spring but adequate control

## 2020-05-27 NOTE — Assessment & Plan Note (Signed)
Controlled  On current med

## 2020-06-01 ENCOUNTER — Other Ambulatory Visit: Payer: Self-pay

## 2020-06-01 MED ORDER — MAGNESIUM OXIDE 400 MG PO CAPS
ORAL_CAPSULE | ORAL | 1 refills | Status: DC
Start: 2020-06-01 — End: 2021-06-28

## 2020-06-11 ENCOUNTER — Ambulatory Visit: Payer: Medicare HMO | Admitting: Family Medicine

## 2020-07-10 ENCOUNTER — Other Ambulatory Visit: Payer: Self-pay | Admitting: Family Medicine

## 2020-07-10 ENCOUNTER — Other Ambulatory Visit: Payer: Self-pay | Admitting: Internal Medicine

## 2020-07-10 DIAGNOSIS — M19019 Primary osteoarthritis, unspecified shoulder: Secondary | ICD-10-CM

## 2020-07-10 DIAGNOSIS — I10 Essential (primary) hypertension: Secondary | ICD-10-CM

## 2020-07-10 DIAGNOSIS — F419 Anxiety disorder, unspecified: Secondary | ICD-10-CM

## 2020-07-10 DIAGNOSIS — E038 Other specified hypothyroidism: Secondary | ICD-10-CM

## 2020-07-10 DIAGNOSIS — Z9109 Other allergy status, other than to drugs and biological substances: Secondary | ICD-10-CM

## 2020-07-10 DIAGNOSIS — K219 Gastro-esophageal reflux disease without esophagitis: Secondary | ICD-10-CM

## 2020-07-13 ENCOUNTER — Other Ambulatory Visit: Payer: Self-pay | Admitting: *Deleted

## 2020-07-13 MED ORDER — HYDROXYZINE PAMOATE 50 MG PO CAPS: ORAL_CAPSULE | ORAL | 2 refills | Status: DC

## 2020-07-15 ENCOUNTER — Telehealth: Payer: Self-pay

## 2020-07-15 NOTE — Telephone Encounter (Signed)
Pt would like something called in for Heartburn

## 2020-07-16 ENCOUNTER — Other Ambulatory Visit: Payer: Self-pay

## 2020-07-16 NOTE — Telephone Encounter (Signed)
States she was taken off the protonix because she was told it had something to do with her potassium being low. She has been using otc meds but not helping, asking for something for heartburn

## 2020-07-17 ENCOUNTER — Other Ambulatory Visit: Payer: Self-pay | Admitting: Family Medicine

## 2020-07-17 MED ORDER — FAMOTIDINE 40 MG PO TABS: 40.0000 mg | ORAL_TABLET | Freq: Every day | ORAL | 0 refills | Status: DC

## 2020-07-17 NOTE — Telephone Encounter (Signed)
Pt aware.

## 2020-07-17 NOTE — Progress Notes (Signed)
Cid 40

## 2020-07-17 NOTE — Telephone Encounter (Signed)
Pt called back asking if something was going to be sent in for her heartburn. I told her I would call her as soon as I received the message back

## 2020-07-29 ENCOUNTER — Other Ambulatory Visit (HOSPITAL_COMMUNITY)
Admission: RE | Admit: 2020-07-29 | Discharge: 2020-07-29 | Disposition: A | Discharge status: Home / Self Care | Payer: Medicare HMO | Source: Ambulatory Visit | Attending: Family Medicine | Admitting: Family Medicine

## 2020-07-29 ENCOUNTER — Encounter: Payer: Self-pay | Admitting: Family Medicine

## 2020-07-29 ENCOUNTER — Other Ambulatory Visit: Payer: Self-pay

## 2020-07-29 ENCOUNTER — Ambulatory Visit (INDEPENDENT_AMBULATORY_CARE_PROVIDER_SITE_OTHER): Payer: Medicare HMO | Admitting: Family Medicine

## 2020-07-29 VITALS — BP 110/71 | HR 98 | Resp 16 | Ht 68.0 in | Wt 123.1 lb

## 2020-07-29 DIAGNOSIS — F17200 Nicotine dependence, unspecified, uncomplicated: Secondary | ICD-10-CM | POA: Diagnosis not present

## 2020-07-29 DIAGNOSIS — Z124 Encounter for screening for malignant neoplasm of cervix: Secondary | ICD-10-CM

## 2020-07-29 DIAGNOSIS — E876 Hypokalemia: Secondary | ICD-10-CM | POA: Diagnosis not present

## 2020-07-29 DIAGNOSIS — D509 Iron deficiency anemia, unspecified: Secondary | ICD-10-CM | POA: Diagnosis not present

## 2020-07-29 DIAGNOSIS — Z1151 Encounter for screening for human papillomavirus (HPV): Secondary | ICD-10-CM | POA: Diagnosis not present

## 2020-07-29 DIAGNOSIS — Z01419 Encounter for gynecological examination (general) (routine) without abnormal findings: Secondary | ICD-10-CM | POA: Diagnosis not present

## 2020-07-29 DIAGNOSIS — Z0000 Encounter for general adult medical examination without abnormal findings: Secondary | ICD-10-CM

## 2020-07-29 DIAGNOSIS — Z13820 Encounter for screening for osteoporosis: Secondary | ICD-10-CM

## 2020-07-29 DIAGNOSIS — F17218 Nicotine dependence, cigarettes, with other nicotine-induced disorders: Secondary | ICD-10-CM

## 2020-07-29 NOTE — Patient Instructions (Addendum)
F/U in 3 months, re evaluate smoking and flu vaccine  Please schedule bone density test at checkout and explain needed to see if bones are thinning excessively  Pap today  Please reconsider covid vaccines, you need them  Chem 7 , EGFR and magnesium today  Please work on Quitting smoking, you need to do this so you can get the surgery you need  Medication for GERD will be changed back to omeprazole 20 mg daily if your magnesium level is good  Thanks for choosing [facility], we consider it a privelige to serve you.

## 2020-07-29 NOTE — Assessment & Plan Note (Signed)
Asked:confirms currently smokes cigarettes °Assess: Unwilling to set a quit date, but is cutting back °Advise: needs to QUIT to reduce risk of cancer, cardio and cerebrovascular disease °Assist: counseled for 5 minutes and literature provided °Arrange: follow up in 2 to 4 months ° °

## 2020-07-29 NOTE — Assessment & Plan Note (Signed)

## 2020-07-29 NOTE — Progress Notes (Signed)
       Kelly Douglas     MRN: [id number]      DOB: 09-04-1955  HPI: Patient is in for annual physical exam. No other health concerns are expressed or addressed at the visit. Recent labs,  are reviewed. Immunization is reviewed , and  updated if needed.   PE: Pleasant  female, alert and oriented x 3, in no cardio-pulmonary distress. Afebrile. HEENT No facial trauma or asymetry. Sinuses non tender.  Extra occullar muscles intact..Poor dentition External ears normal, . Neck: supple, no adenopathy,JVD or thyromegaly.No bruits.  Chest: Clear to ascultation bilaterally.No crackles or wheezes.decreased air entry bilaterally Non tender to palpation  Breast: No asymetry,no masses or lumps. No tenderness. No nipple discharge or inversion. No axillary or supraclavicular adenopathy  Cardiovascular system; Heart sounds normal,  S1 and  S2 ,no S3.  No murmur, or thrill. Apical beat not displaced Peripheral pulses normal.  Abdomen: Soft, non tender, no organomegaly or masses. No bruits. Bowel sounds normal. No guarding, tenderness or rebound.   GU: External genitalia normal female genitalia , normal female distribution of hair. No lesions. Urethral meatus normal in size, no  Prolapse, no lesions visibly  Present. Bladder non tender. Vagina pink and moist , with no visible lesions , discharge present . Adequate pelvic support no  cystocele or rectocele noted Cervix pink and appears healthy, no lesions or ulcerations noted, no discharge noted from os Uterus atrophic, no adnexal masses, no cervical motion or adnexal tenderness.   Musculoskeletal exam: Decreased  ROM of spine, hips , shoulders and knees. No deformity ,swelling or crepitus noted. No muscle wasting or atrophy.   Neurologic: Cranial nerves 2 to 12 intact. Power, tone ,sensation and reflexes normal throughout. No disturbance in gait. No tremor.  Skin: Intact, no ulceration, erythema , scaling or rash  noted. Pigmentation normal throughout  Psych; Normal mood and affect. Judgement and concentration normal   Assessment & Plan:  Annual physical exam Annual exam as documented. Counseling done  re healthy lifestyle involving commitment to 150 minutes exercise per week, heart healthy diet, and attaining healthy weight.The importance of adequate sleep also discussed. Regular seat belt use and home safety, is also discussed. Changes in health habits are decided on by the patient with goals and time frames  set for achieving them. Immunization and cancer screening needs are specifically addressed at this visit.   Nicotine dependence Asked:confirms currently smokes cigarettes Assess: Unwilling to set a quit date, but is cutting back Advise: needs to QUIT to reduce risk of cancer, cardio and cerebrovascular disease Assist: counseled for 5 minutes and literature provided Arrange: follow up in 2 to 4 months

## 2020-07-30 ENCOUNTER — Encounter: Payer: Self-pay | Admitting: Family Medicine

## 2020-07-31 LAB — CMP14+EGFR
ALT: 8 IU/L (ref 0–32)
AST: 13 IU/L (ref 0–40)
Albumin/Globulin Ratio: 1.7 (ref 1.2–2.2)
Albumin: 4.5 g/dL (ref 3.8–4.8)
Alkaline Phosphatase: 112 IU/L (ref 44–121)
BUN/Creatinine Ratio: 7 — ABNORMAL LOW (ref 12–28)
BUN: 8 mg/dL (ref 8–27)
Bilirubin Total: 0.5 mg/dL (ref 0.0–1.2)
CO2: 25 mmol/L (ref 20–29)
Calcium: 10.1 mg/dL (ref 8.7–10.3)
Chloride: 102 mmol/L (ref 96–106)
Creatinine, Ser: 1.17 mg/dL — ABNORMAL HIGH (ref 0.57–1.00)
Globulin, Total: 2.6 g/dL (ref 1.5–4.5)
Glucose: 83 mg/dL (ref 65–99)
Potassium: 4 mmol/L (ref 3.5–5.2)
Sodium: 141 mmol/L (ref 134–144)
Total Protein: 7.1 g/dL (ref 6.0–8.5)
eGFR: 52 mL/min/{1.73_m2} — ABNORMAL LOW (ref >59)

## 2020-07-31 LAB — MAGNESIUM: Magnesium: 2 mg/dL (ref 1.6–2.3)

## 2020-07-31 MED ORDER — PANTOPRAZOLE SODIUM 20 MG PO TBEC: 20.0000 mg | DELAYED_RELEASE_TABLET | Freq: Every day | ORAL | 5 refills | Status: DC

## 2020-07-31 NOTE — Addendum Note (Signed)
Addended by: [name] on: 07/31/2020 07:56 AM   Modules accepted: Orders

## 2020-08-03 ENCOUNTER — Other Ambulatory Visit (HOSPITAL_COMMUNITY): Payer: Medicare HMO

## 2020-08-04 LAB — CYTOLOGY - PAP
Comment: NEGATIVE
Diagnosis: NEGATIVE
High risk HPV: NEGATIVE

## 2020-08-06 ENCOUNTER — Inpatient Hospital Stay (HOSPITAL_COMMUNITY): Admission: RE | Admit: 2020-08-06 | Payer: Medicare HMO | Source: Ambulatory Visit

## 2020-08-17 ENCOUNTER — Telehealth: Payer: Self-pay | Admitting: Family Medicine

## 2020-08-17 NOTE — Telephone Encounter (Signed)
Pt would l a call back from the nurse

## 2020-08-19 NOTE — Telephone Encounter (Signed)
Pt states she is experience a lot of reflux again and she was unable to take the last acid medication due to it messing up her potassium, wants to know if there is any other solution you recommend.

## 2020-08-20 ENCOUNTER — Other Ambulatory Visit: Payer: Self-pay | Admitting: Family Medicine

## 2020-08-20 ENCOUNTER — Telehealth: Payer: Self-pay

## 2020-08-20 DIAGNOSIS — K219 Gastro-esophageal reflux disease without esophagitis: Secondary | ICD-10-CM

## 2020-08-20 NOTE — Telephone Encounter (Signed)
Lvm with recommendations

## 2020-08-20 NOTE — Telephone Encounter (Signed)
LVM with recommendations

## 2020-08-20 NOTE — Telephone Encounter (Signed)
Patient called needs something called in for her heart burn.   Pharmacy: [place]

## 2020-08-25 ENCOUNTER — Encounter: Payer: Self-pay | Admitting: Internal Medicine

## 2020-08-27 ENCOUNTER — Inpatient Hospital Stay (HOSPITAL_COMMUNITY): Admission: RE | Admit: 2020-08-27 | Payer: Medicare HMO | Source: Ambulatory Visit

## 2020-09-03 ENCOUNTER — Other Ambulatory Visit: Payer: Self-pay

## 2020-09-03 ENCOUNTER — Ambulatory Visit (HOSPITAL_COMMUNITY)
Admission: RE | Admit: 2020-09-03 | Discharge: 2020-09-03 | Disposition: A | Discharge status: Home / Self Care | Payer: Medicare HMO | Source: Ambulatory Visit | Attending: Family Medicine | Admitting: Family Medicine

## 2020-09-03 DIAGNOSIS — M810 Age-related osteoporosis without current pathological fracture: Secondary | ICD-10-CM | POA: Insufficient documentation

## 2020-09-03 DIAGNOSIS — Z13820 Encounter for screening for osteoporosis: Secondary | ICD-10-CM | POA: Diagnosis not present

## 2020-09-03 DIAGNOSIS — M85852 Other specified disorders of bone density and structure, left thigh: Secondary | ICD-10-CM | POA: Diagnosis not present

## 2020-09-03 DIAGNOSIS — Z780 Asymptomatic menopausal state: Secondary | ICD-10-CM | POA: Diagnosis not present

## 2020-09-11 ENCOUNTER — Telehealth: Payer: Self-pay | Admitting: *Deleted

## 2020-09-11 NOTE — Chronic Care Management (AMB) (Signed)
  Chronic Care Management   Outreach Note  09/11/2020 Name: Kelly Douglas MRN: [id number] DOB: 05-10-1955  Kelly Douglas is a 65 y.o. year old female who is a primary care patient of [name], MD. I reached out to Kelly Douglas by phone today in response to a referral sent by Ms. [place] Bailiff Cybulski's PCP, Dr. Moshe Cipro.       An unsuccessful telephone outreach was attempted today. The patient was referred to the case management team for assistance with care management and care coordination.   Follow Up Plan: A HIPAA compliant phone message was left for the patient providing contact information and requesting a return call. The care management team will reach out to the patient again over the next 7 days.  If patient returns call to provider office, please advise to call [place] at [phone].  [facility]  Direct Dial: [phone]

## 2020-09-17 NOTE — Chronic Care Management (AMB) (Signed)
  Chronic Care Management   Outreach Note  09/17/2020 Name: HUNTLEY DEMEDEIROS MRN: [id number] DOB: 28-Apr-1955  [name] is a 65 y.o. year old female who is a primary care patient of [name], MD. I reached out to [name] by phone today in response to a referral sent by Ms. [place] Bailiff Trebilcock's PCP, Dr. Moshe Cipro.      A second unsuccessful telephone outreach was attempted today. The patient was referred to the case management team for assistance with care management and care coordination.   Follow Up Plan: The care management team will reach out to the patient again over the next 7 days. If patient returns call to provider office, please advise to call [place] at [phone].  [place] [facility]  Direct Dial: [phone]

## 2020-09-24 NOTE — Chronic Care Management (AMB) (Signed)
  Chronic Care Management   Outreach Note  09/24/2020 Name: Kelly Douglas MRN: [id number] DOB: October 27, 1955  Kelly Douglas is a 65 y.o. year old female who is a primary care patient of [name], MD. I reached out to Kelly Douglas by phone today in response to a referral sent by Ms. [place] Bailiff Zarling's PCP, Dr. Moshe Cipro.      Third unsuccessful telephone outreach was attempted today. The patient was referred to the case management team for assistance with care management and care coordination. The patient's primary care provider has been notified of our unsuccessful attempts to make or maintain contact with the patient. The care management team is pleased to engage with this patient at any time in the future should he/she be interested in assistance from the care management team.   Follow Up Plan: We have been unable to make contact with the patient.  The care management team is available to follow up with the patient after provider conversation with the patient regarding recommendation for care management engagement and subsequent re-referral to the care management team.   [facility]  Direct Dial: [phone]

## 2020-09-28 ENCOUNTER — Inpatient Hospital Stay (HOSPITAL_COMMUNITY): Admission: RE | Admit: 2020-09-28 | Payer: Medicare HMO | Source: Ambulatory Visit

## 2020-10-15 ENCOUNTER — Other Ambulatory Visit: Payer: Self-pay | Admitting: Family Medicine

## 2020-10-29 ENCOUNTER — Encounter: Payer: Self-pay | Admitting: Family Medicine

## 2020-10-29 ENCOUNTER — Other Ambulatory Visit: Payer: Self-pay

## 2020-10-29 ENCOUNTER — Ambulatory Visit (INDEPENDENT_AMBULATORY_CARE_PROVIDER_SITE_OTHER): Payer: Medicare HMO | Admitting: Family Medicine

## 2020-10-29 DIAGNOSIS — J209 Acute bronchitis, unspecified: Secondary | ICD-10-CM

## 2020-10-29 DIAGNOSIS — F17218 Nicotine dependence, cigarettes, with other nicotine-induced disorders: Secondary | ICD-10-CM

## 2020-10-29 DIAGNOSIS — J440 Chronic obstructive pulmonary disease with acute lower respiratory infection: Secondary | ICD-10-CM

## 2020-10-29 MED ORDER — AZITHROMYCIN 250 MG PO TABS: ORAL_TABLET | ORAL | 0 refills | Status: AC

## 2020-10-29 MED ORDER — BENZONATATE 100 MG PO CAPS: 100.0000 mg | ORAL_CAPSULE | Freq: Two times a day (BID) | ORAL | 0 refills | Status: DC | PRN

## 2020-10-29 NOTE — Assessment & Plan Note (Signed)
Asked:confirms currently smokes cigarettes °Assess: Unwilling to set a quit date, but is cutting back °Advise: needs to QUIT to reduce risk of cancer, cardio and cerebrovascular disease °Assist: counseled for 5 minutes and literature provided °Arrange: follow up in 2 to 4 months ° °

## 2020-10-29 NOTE — Patient Instructions (Addendum)
F/U with MD in 4 months, call if you need me sooner  Keep November appointment in office  Please schedule annual physical exam in office 07 15/2023 or after   I understand that you feel very stressed, I still DO encourage you to cut back on smoking cigarettes. You do need to quit for your health  Thanks for choosing [facility], we consider it a privelige to serve you.

## 2020-10-29 NOTE — Assessment & Plan Note (Signed)
z pack with tesslon perles is prescribed

## 2020-10-29 NOTE — Progress Notes (Signed)
Virtual Visit via Telephone Note  I connected with [name] on 10/29/20 at  2:40 PM EDT by telephone and verified that I am speaking with the correct person using two identifiers.  [place]: Patient: home  Provider: office   I discussed the limitations, risks, security and privacy concerns of performing an evaluation and management service by telephone and the availability of in person appointments. I also discussed with the patient that there may be a patient responsible charge related to this service. The patient expressed understanding and agreed to proceed.   History of Present Illness: 3 day h/o increased sinus pressure and drainage and cough productiv of yellow sputu, smoked 1/2 PPD and no quit date in mind   Observations/Objective: LMP  (LMP Unknown)  Good communication with no confusion and intact memory. Alert and oriented x 3 Cough and congestion Acute bronchitis with COPD ([place]) z pack with tesslon perles is prescribed  Nicotine dependence Asked:confirms currently smokes cigarettes Assess: Unwilling to set a quit date, but is cutting back Advise: needs to QUIT to reduce risk of cancer, cardio and cerebrovascular disease Assist: counseled for 5 minutes and literature provided Arrange: follow up in 2 to 4 months   Follow Up Instructions:    I discussed the assessment and treatment plan with the patient. The patient was provided an opportunity to ask questions and all were answered. The patient agreed with the plan and demonstrated an understanding of the instructions.   The patient was advised to call back or seek an in-person evaluation if the symptoms worsen or if the condition fails to improve as anticipated.I provided 11 minutes of non-face-to-face time during this encounter.   Tula Nakayama, MD

## 2020-11-04 DIAGNOSIS — H5210 Myopia, unspecified eye: Secondary | ICD-10-CM | POA: Diagnosis not present

## 2020-11-25 ENCOUNTER — Other Ambulatory Visit: Payer: Self-pay | Admitting: Family Medicine

## 2020-11-25 ENCOUNTER — Ambulatory Visit (INDEPENDENT_AMBULATORY_CARE_PROVIDER_SITE_OTHER): Payer: Medicare HMO | Admitting: *Deleted

## 2020-11-25 ENCOUNTER — Other Ambulatory Visit: Payer: Self-pay

## 2020-11-25 ENCOUNTER — Other Ambulatory Visit: Payer: Self-pay | Admitting: Internal Medicine

## 2020-11-25 DIAGNOSIS — F32A Depression, unspecified: Secondary | ICD-10-CM

## 2020-11-25 DIAGNOSIS — F419 Anxiety disorder, unspecified: Secondary | ICD-10-CM

## 2020-11-25 DIAGNOSIS — Z0000 Encounter for general adult medical examination without abnormal findings: Secondary | ICD-10-CM | POA: Diagnosis not present

## 2020-11-25 DIAGNOSIS — M19019 Primary osteoarthritis, unspecified shoulder: Secondary | ICD-10-CM

## 2020-11-25 NOTE — Patient Instructions (Signed)
Kelly Douglas , Thank you for taking time to come for your Medicare Wellness Visit. I appreciate your ongoing commitment to your health goals. Please review the following plan we discussed and let me know if I can assist you in the future.   Screening recommendations/referrals: Colonoscopy: Up to date Mammogram: Due now, patient is calling to scheduled Bone Density: Up to date Recommended yearly ophthalmology/optometry visit for glaucoma screening and checkup Recommended yearly dental visit for hygiene and checkup  Vaccinations: Influenza vaccine: Due now Pneumococcal vaccine: Up to date Tdap vaccine: Up to date Shingles vaccine: Due now    Advanced directives: Information discussed  Conditions/risks identified: Hypertension, Hyperlipidemia  Next appointment: 1 year   Preventive Care 27 Years and Older, Female Preventive care refers to lifestyle choices and visits with your health care provider that can promote health and wellness. What does preventive care include? A yearly physical exam. This is also called an annual well check. Dental exams once or twice a year. Routine eye exams. Ask your health care provider how often you should have your eyes checked. Personal lifestyle choices, including: Daily care of your teeth and gums. Regular physical activity. Eating a healthy diet. Avoiding tobacco and drug use. Limiting alcohol use. Practicing safe sex. Taking low-dose aspirin every day. Taking vitamin and mineral supplements as recommended by your health care provider. What happens during an annual well check? The services and screenings done by your health care provider during your annual well check will depend on your age, overall health, lifestyle risk factors, and family history of disease. Counseling  Your health care provider may ask you questions about your: Alcohol use. Tobacco use. Drug use. Emotional well-being. Home and relationship well-being. Sexual  activity. Eating habits. History of falls. Memory and ability to understand (cognition). Work and work Statistician. Reproductive health. Screening  You may have the following tests or measurements: Height, weight, and BMI. Blood pressure. Lipid and cholesterol levels. These may be checked every 5 years, or more frequently if you are over 46 years old. Skin check. Lung cancer screening. You may have this screening every year starting at age 1 if you have a 30-pack-year history of smoking and currently smoke or have quit within the past 15 years. Fecal occult blood test (FOBT) of the stool. You may have this test every year starting at age 59. Flexible sigmoidoscopy or colonoscopy. You may have a sigmoidoscopy every 5 years or a colonoscopy every 10 years starting at age 30. Hepatitis C blood test. Hepatitis B blood test. Sexually transmitted disease (STD) testing. Diabetes screening. This is done by checking your blood sugar (glucose) after you have not eaten for a while (fasting). You may have this done every 1-3 years. Bone density scan. This is done to screen for osteoporosis. You may have this done starting at age 70. Mammogram. This may be done every 1-2 years. Talk to your health care provider about how often you should have regular mammograms. Talk with your health care provider about your test results, treatment options, and if necessary, the need for more tests. Vaccines  Your health care provider may recommend certain vaccines, such as: Influenza vaccine. This is recommended every year. Tetanus, diphtheria, and acellular pertussis (Tdap, Td) vaccine. You may need a Td booster every 10 years. Zoster vaccine. You may need this after age 23. Pneumococcal 13-valent conjugate (PCV13) vaccine. One dose is recommended after age 71. Pneumococcal polysaccharide (PPSV23) vaccine. One dose is recommended after age 63. Talk to your health  care provider about which screenings and vaccines  you need and how often you need them. This information is not intended to replace advice given to you by your health care provider. Make sure you discuss any questions you have with your health care provider. Document Released: 01/30/2015 Document Revised: 09/23/2015 Document Reviewed: 11/04/2014 Elsevier Interactive Patient Education  2017 [place] Prevention in the Home Falls can cause injuries. They can happen to people of all ages. There are many things you can do to make your home safe and to help prevent falls. What can I do on the outside of my home? Regularly fix the edges of walkways and driveways and fix any cracks. Remove anything that might make you trip as you walk through a door, such as a raised step or threshold. Trim any bushes or trees on the path to your home. Use bright outdoor lighting. Clear any walking paths of anything that might make someone trip, such as rocks or tools. Regularly check to see if handrails are loose or broken. Make sure that both sides of any steps have handrails. Any raised decks and porches should have guardrails on the edges. Have any leaves, snow, or ice cleared regularly. Use sand or salt on walking paths during winter. Clean up any spills in your garage right away. This includes oil or grease spills. What can I do in the bathroom? Use night lights. Install grab bars by the toilet and in the tub and shower. Do not use towel bars as grab bars. Use non-skid mats or decals in the tub or shower. If you need to sit down in the shower, use a plastic, non-slip stool. Keep the floor dry. Clean up any water that spills on the floor as soon as it happens. Remove soap buildup in the tub or shower regularly. Attach bath mats securely with double-sided non-slip rug tape. Do not have throw rugs and other things on the floor that can make you trip. What can I do in the bedroom? Use night lights. Make sure that you have a light by your bed that  is easy to reach. Do not use any sheets or blankets that are too big for your bed. They should not hang down onto the floor. Have a firm chair that has side arms. You can use this for support while you get dressed. Do not have throw rugs and other things on the floor that can make you trip. What can I do in the kitchen? Clean up any spills right away. Avoid walking on wet floors. Keep items that you use a lot in easy-to-reach places. If you need to reach something above you, use a strong step stool that has a grab bar. Keep electrical cords out of the way. Do not use floor polish or wax that makes floors slippery. If you must use wax, use non-skid floor wax. Do not have throw rugs and other things on the floor that can make you trip. What can I do with my stairs? Do not leave any items on the stairs. Make sure that there are handrails on both sides of the stairs and use them. Fix handrails that are broken or loose. Make sure that handrails are as long as the stairways. Check any carpeting to make sure that it is firmly attached to the stairs. Fix any carpet that is loose or worn. Avoid having throw rugs at the top or bottom of the stairs. If you do have throw rugs, attach them to  the floor with carpet tape. Make sure that you have a light switch at the top of the stairs and the bottom of the stairs. If you do not have them, ask someone to add them for you. What else can I do to help prevent falls? Wear shoes that: Do not have high heels. Have rubber bottoms. Are comfortable and fit you well. Are closed at the toe. Do not wear sandals. If you use a stepladder: Make sure that it is fully opened. Do not climb a closed stepladder. Make sure that both sides of the stepladder are locked into place. Ask someone to hold it for you, if possible. Clearly mark and make sure that you can see: Any grab bars or handrails. First and last steps. Where the edge of each step is. Use tools that help you  move around (mobility aids) if they are needed. These include: Canes. Walkers. Scooters. Crutches. Turn on the lights when you go into a dark area. Replace any light bulbs as soon as they burn out. Set up your furniture so you have a clear path. Avoid moving your furniture around. If any of your floors are uneven, fix them. If there are any pets around you, be aware of where they are. Review your medicines with your doctor. Some medicines can make you feel dizzy. This can increase your chance of falling. Ask your doctor what other things that you can do to help prevent falls. This information is not intended to replace advice given to you by your health care provider. Make sure you discuss any questions you have with your health care provider. Document Released: 10/30/2008 Document Revised: 06/11/2015 Document Reviewed: 02/07/2014 Elsevier Interactive Patient Education  2017 [employer].

## 2020-11-25 NOTE — Progress Notes (Addendum)
Subjective:   Kelly Douglas is a 65 y.o. female who presents for Medicare Annual (Subsequent) preventive examination. I connected with  [name] on 11/25/20 by audio enabled telemedicine application and verified that I am speaking with the correct person using two identifiers.   I discussed the limitations of evaluation and management by telemedicine. The patient expressed understanding and agreed to proceed.   Review of Systems    NA       Objective:    There were no vitals filed for this visit. There is no height or weight on file to calculate BMI.  Advanced Directives 05/07/2020 04/18/2020 03/10/2020 03/10/2020 02/24/2020 02/23/2020 12/19/2019  Does Patient Have a Medical Advance Directive? No No No No No No No  Does patient want to make changes to medical advance directive? - - - - - - -  Would patient like information on creating a medical advance directive? - - No - Patient declined No - Patient declined No - Patient declined - No - Patient declined  Pre-existing out of facility DNR order (yellow form or pink MOST form) - - - - - - -    Current Medications (verified) Outpatient Encounter Medications as of 11/25/2020  Medication Sig   acetaminophen (TYLENOL) 650 MG CR tablet Take 1,300 mg by mouth every 8 (eight) hours as needed for pain.    amLODipine (NORVASC) 5 MG tablet TAKE 1 TABLET EVERY DAY   atorvastatin (LIPITOR) 10 MG tablet TAKE 1 TABLET(10 MG) BY MOUTH DAILY   benzonatate (TESSALON) 100 MG capsule Take 1 capsule (100 mg total) by mouth 2 (two) times daily as needed for cough.   Cholecalciferol (VITAMIN D) 125 MCG (5000 UT) CAPS Take 5,000 Units by mouth daily.    DULoxetine (CYMBALTA) 60 MG capsule TAKE 1 CAPSULE EVERY DAY   levothyroxine (SYNTHROID) 75 MCG tablet TAKE 1 TABLET EVERY OTHER DAY ALTERNATING WITH TAKE 1/2 TABLET EVERY OTHER DAY   Magnesium Oxide 400 MG CAPS One capsule three times daily   meloxicam (MOBIC) 7.5 MG tablet TAKE 1 TABLET EVERY DAY    mirtazapine (REMERON) 7.5 MG tablet TAKE 1 TABLET AT BEDTIME   montelukast (SINGULAIR) 10 MG tablet TAKE 1 TABLET AT BEDTIME   Multiple Vitamin (MULTIVITAMIN WITH MINERALS) TABS tablet Take 1 tablet by mouth daily.   pantoprazole (PROTONIX) 20 MG tablet Take 1 tablet (20 mg total) by mouth daily.   PROAIR RESPICLICK 683 (90 Base) MCG/ACT AEPB Inhale 2 puffs into the lungs 4 (four) times daily as needed.   No facility-administered encounter medications on file as of 11/25/2020.    Allergies (verified) Aspirin   History: Past Medical History:  Diagnosis Date   Anxiety    Arteriosclerotic cardiovascular disease (ASCVD) 2013   coronary calcification-left main, LAD and CX; small pericardial effusion   Arthritis    hips   Cancer (HCC)    Lung Cancer    Chronic back pain    Chronic bronchitis    Chronic lung disease    Chronic scarring and volume loss-left lung; characteristics of a chronic infectious process-possible MAI   Depression    Dyspnea    occasional    GERD (gastroesophageal reflux disease)    Dr Gala Romney EGD 09/2009->esophagitis, sm HH, antral erosions, atonic esophagus   Hyperlipidemia    Hypertension    Hypothyroid 1981 approx   Scleroderma ([place])    Seasonal allergies    Syncope    Multiple spells over the past 40+ years, likely  neurocardiogenic   Tobacco abuse 06/24/2009   Past Surgical History:  Procedure Laterality Date   BILATERAL SALPINGOOPHORECTOMY  2013    Dr. Elonda Husky; uterus remains in situ   BRONCHIAL BIOPSY  11/21/2019   Procedure: BRONCHIAL BIOPSIES;  Surgeon: Collene Gobble, MD;  Location: [facility] ENDOSCOPY;  Service: Pulmonary;;   BRONCHIAL BRUSHINGS  11/21/2019   Procedure: BRONCHIAL BRUSHINGS;  Surgeon: Collene Gobble, MD;  Location: [facility] ENDOSCOPY;  Service: Pulmonary;;   BRONCHIAL NEEDLE ASPIRATION BIOPSY  11/21/2019   Procedure: BRONCHIAL NEEDLE ASPIRATION BIOPSIES;  Surgeon: Collene Gobble, MD;  Location: [place];  Service: Pulmonary;;   BRONCHIAL  WASHINGS  11/21/2019   Procedure: BRONCHIAL WASHINGS;  Surgeon: Collene Gobble, MD;  Location: [place];  Service: Pulmonary;;   COLONOSCOPY  09/28/09   anal papilla otherwise normal   COLONOSCOPY N/A 08/29/2012   XAJ:OINOMVE polyp-removed as described above. tubular adenoma   COLONOSCOPY WITH PROPOFOL N/A 11/11/2019   Procedure: COLONOSCOPY WITH PROPOFOL;  Surgeon: Daneil Dolin, MD;  Location: [facility] ENDO SU[facility];  Service: Endoscopy;  Laterality: N/A;  2:30pm   ESOPHAGOGASTRODUODENOSCOPY  10/07/09   Dr. Barrie Dunker of esophageal mucosa, diffusely ?esophagitis (bx benign), small HH/antral erosions, erythema bx benign. 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 Hiatal hernia   FUDUCIAL PLACEMENT  10/18/2017   Procedure: PLACEMENT OF 3 FUDUCIAL INTO LEFT LOWER LOBE OF LUNG-TARGET 1;  Surgeon: Collene Gobble, MD;  Location: [place];  Service: Thoracic;;   LAPAROSCOPIC APPENDECTOMY  07/16/2011   Procedure: APPENDECTOMY LAPAROSCOPIC;  Surgeon: Donato Heinz, MD;  Location: [facility];  Service: General;  Laterality: N/A;   POLYPECTOMY  11/11/2019   Procedure: POLYPECTOMY;  Surgeon: Daneil Dolin, MD;  Location[facility];  Service: Endoscopy;;   TUBAL LIGATION     VIDEO BRONCHOSCOPY  10/18/2017   VIDEO BRONCHOSCOPY WITH ENDOBRONCHIAL NAVIGATION N/A 10/18/2017   Procedure: VIDEO BRONCHOSCOPY WITH ENDOBRONCHIAL NAVIGATION;  Surgeon: Collene Gobble, MD;  Location: [place];  Service: Thoracic;  Laterality: N/A;   VIDEO BRONCHOSCOPY WITH ENDOBRONCHIAL NAVIGATION Left 11/21/2019   Procedure: VIDEO BRONCHOSCOPY WITH ENDOBRONCHIAL NAVIGATION;  Surgeon: Collene Gobble, MD;  Location: [facility] ENDOSCOPY;  Service: Pulmonary;  Laterality: Left;   Family History  Problem Relation Age of Onset   Stroke Father    Aneurysm Father     Coronary artery disease Brother    Hypertension Brother    Down syndrome Brother    Hypertension Brother    Hypertension Brother        father/mother   Aneurysm Mother    Throat cancer Brother 13       throat cancer   Hypertension Sister    Diabetes Brother    Diabetes Brother    Anesthesia problems Neg Hx    Hypotension Neg Hx    Malignant hyperthermia Neg Hx    Pseudochol deficiency Neg Hx    Colon cancer Neg Hx    Social History   Socioeconomic History   Marital status: Married    Spouse name: Not on file   Number of children: 2   Years of education: Not on file   Highest education level: Not on file  Occupational History   Occupation: disabled    Employer: NOT EMPLOYED  Tobacco Use   Smoking status: Every Day    Packs/day: 0.50    Years: 45.00  Pack years: 22.50    Types: Cigarettes    Start date: 07/14/1971   Smokeless tobacco: Never   Tobacco comments:    0.5 packs of cigarettes smoked daily ARJ 12/31/19  Vaping Use   Vaping Use: Never used  Substance and Sexual Activity   Alcohol use: Not Currently    Alcohol/week: 0.0 standard drinks    Comment: quit June 2012-used to drink 6 pack daily, occasional beer per pt 11/20/19   Drug use: No   Sexual activity: Yes    Birth control/protection: Post-menopausal  Other Topics Concern   Not on file  Social History Narrative   Not on file   Social Determinants of Health   Financial Resource Strain: Low Risk    Difficulty of Paying Living Expenses: Not hard at all  Food Insecurity: No Food Insecurity   Worried About Charity fundraiser in the Last Year: Never true   Arboriculturist in the Last Year: Never true  Transportation Needs: No Transportation Needs   Lack of Transportation (Medical): No   Lack of Transportation (Non-Medical): No  Physical Activity: Insufficiently Active   Days of Exercise per Week: 1 day   Minutes of Exercise per Session: 10 min  Stress: No Stress Concern Present   Feeling of Stress  : Only a little  Social Connections: Moderately Integrated   Frequency of Communication with Friends and Family: More than three times a week   Frequency of Social Gatherings with Friends and Family: More than three times a week   Attends Religious Services: 1 to 4 times per year   Active Member of [employer] or Organizations: No   Attends Archivist Meetings: Never   Marital Status: Married    Tobacco Counseling Ready to quit: Not Answered Counseling given: Not Answered Tobacco comments: 0.5 packs of cigarettes smoked daily ARJ 12/31/19   Clinical Intake:                 Diabetic?No         Activities of Daily Living In your present state of health, do you have any difficulty performing the following activities: 03/10/2020 02/24/2020  Hearing? N N  Vision? N N  Difficulty concentrating or making decisions? N N  Walking or climbing stairs? Y N  Dressing or bathing? N N  Doing errands, shopping? Y N  Some recent data might be hidden    Patient Care Team: [name], MD as PCP - General Rothbart, [name], MD (Inactive) (Cardiology) Gala Romney [name], MD (Gastroenterology) Teena Irani, PTA as Physical Therapy Assistant (Physical Therapy) Leta Baptist, MD as Consulting Physician (Otolaryngology)  Indicate any recent Medical Services you may have received from other than [facility] providers in the past year (date may be approximate).     Assessment:   This is a routine wellness examination for Kelly Douglas.  Hearing/Vision screen No results found.  Dietary issues and exercise activities discussed:     Goals Addressed   None   Depression Screen PHQ 2/9 Scores 10/29/2020 07/29/2020 05/25/2020 03/26/2020 03/04/2020 12/20/2019 12/19/2019  PHQ - 2 Score 0 0 0 0 0 0 0  PHQ- 9 Score - - - - - - -    Fall Risk Fall Risk  10/29/2020 07/29/2020 05/25/2020 03/26/2020 03/04/2020  Falls in the past year? 1 0 1 0 1  Number falls in past yr: 1 0 1 0 0  Injury with Fall? 0 0  1 0 1  Risk Factor  Category  - - - - -  Risk for fall due to : - - Impaired balance/gait No Fall Risks Impaired balance/gait  Follow up - - - Falls evaluation completed Falls evaluation completed;Education provided;Falls prevention discussed;Follow up appointment    FALL RISK PREVENTION PERTAINING TO THE HOME:  Any stairs in or around the home? Yes  If so, are there any without handrails? No  Home free of loose throw rugs in walkways, pet beds, electrical cords, etc? Yes  Adequate lighting in your home to reduce risk of falls? Yes   ASSISTIVE DEVICES UTILIZED TO PREVENT FALLS:  Life alert? No  Use of a cane, Kelly Douglas or w/c? No  Grab bars in the bathroom? No  Shower chair or bench in shower? No  Elevated toilet seat or a handicapped toilet? No   TIMED UP AND GO:  Was the test performed? No .  Length of time to ambulate 10 feet: NA sec.     Cognitive Function:     6CIT Screen 11/20/2019 07/05/2018 07/03/2017 03/01/2016  What Year? 0 points 0 points 0 points 0 points  What month? 0 points 0 points 0 points 0 points  What time? 0 points 0 points 0 points 0 points  Count back from 20 0 points 0 points 0 points 0 points  Months in reverse 2 points 0 points 0 points 0 points  Repeat phrase 0 points 0 points 0 points 0 points  Total Score 2 0 0 0    Immunizations Immunization History  Administered Date(s) Administered   Influenza Split 10/12/2011   Influenza Whole 10/13/2009   Influenza,inj,Quad PF,6+ Mos 11/01/2012, 09/16/2013, 11/20/2014, 10/27/2015, 11/28/2016, 09/26/2017, 10/30/2018, 09/18/2019   Pneumococcal Conjugate-13 12/20/2013   Pneumococcal Polysaccharide-23 10/30/2018   Td 06/24/2009   Tdap 11/17/2010    TDAP status: Up to date  Flu Vaccine status: Due, Education has been provided regarding the importance of this vaccine. Advised may receive this vaccine at local pharmacy or Health Dept. Aware to provide a copy of the vaccination record if obtained from local  pharmacy or Health Dept. Verbalized acceptance and understanding.  Pneumococcal vaccine status: Up to date  Covid-19 vaccine status: Declined, Education has been provided regarding the importance of this vaccine but patient still declined. Advised may receive this vaccine at local pharmacy or Health Dept.or vaccine clinic. Aware to provide a copy of the vaccination record if obtained from local pharmacy or Health Dept. Verbalized acceptance and understanding.  Qualifies for Shingles Vaccine? No   Zostavax completed No   Shingrix Completed?: No.    Education has been provided regarding the importance of this vaccine. Patient has been advised to call insurance company to determine out of pocket expense if they have not yet received this vaccine. Advised may also receive vaccine at local pharmacy or Health Dept. Verbalized acceptance and understanding.  Screening Tests Health Maintenance  Topic Date Due   Zoster Vaccines- Shingrix (1 of 2) Never done   INFLUENZA VACCINE  08/17/2020   TETANUS/TDAP  11/16/2020   COVID-19 Vaccine (1) 05/25/2021 (Originally 01/13/1956)   MAMMOGRAM  09/24/2021   PAP SMEAR-Modifier  07/30/2023   Pneumonia Vaccine 74+ Years old (3 - PPSV23 if available, else PCV20) 10/30/2023   COLONOSCOPY (Pts 45-69yrs Insurance coverage will need to be confirmed)  11/10/2029   DEXA SCAN  Completed   Hepatitis C Screening  Completed   HIV Screening  Completed   HPV VACCINES  Aged Out    Health Maintenance  Health Maintenance  Due  Topic Date Due   Zoster Vaccines- Shingrix (1 of 2) Never done   INFLUENZA VACCINE  08/17/2020   TETANUS/TDAP  11/16/2020    Colorectal cancer screening: Type of screening: Colonoscopy. Completed 11/11/2019. Repeat every 10 years  Mammogram status: Ordered Patient is reaching out to have mammogram scheduled. Pt provided with contact info and advised to call to schedule appt.   Bone Density status: Completed 09/03/2020. Results reflect: Bone  density results: OSTEOPOROSIS. Repeat every 2 years.  Lung Cancer Screening: (Low Dose CT Chest recommended if Age 36-80 years, 30 pack-year currently smoking OR have quit w/in 15years.) does not qualify.   Lung Cancer Screening Referral: NA  Additional Screening:  Hepatitis C Screening: does qualify; Completed NA  Vision Screening: Recommended annual ophthalmology exams for early detection of glaucoma and other disorders of the eye. Is the patient up to date with their annual eye exam?  Yes  Who is the provider or what is the name of the office in which the patient attends annual eye exams? [facility] In [place] If pt is not established with a provider, would they like to be referred to a provider to establish care? No .   Dental Screening: Recommended annual dental exams for proper oral hygiene  Community Resource Referral / Chronic Care Management: CRR required this visit?  No   CCM required this visit?  No      Plan:     I have personally reviewed and noted the following in the patient's chart:   Medical and social history Use of alcohol, tobacco or illicit drugs  Current medications and supplements including opioid prescriptions.  Functional ability and status Nutritional status Physical activity Advanced directives List of other physicians Hospitalizations, surgeries, and ER visits in previous 12 months Vitals Screenings to include cognitive, depression, and falls Referrals and appointments  In addition, I have reviewed and discussed with patient certain preventive protocols, quality metrics, and best practice recommendations. A written personalized care plan for preventive services as well as general preventive health recommendations were provided to patient.     Danella Penton, [place]   11/25/2020   Nurse Notes: The patient was at home during time of call. The provider was in the office and is Tula Nakayama, MD. I have reviewed and agree with the above AWV  documentation. Norwood Levo. Moshe Cipro, MD [facility] Location Provider: office Location Patient: home 11/25/2020, 12:26 PM

## 2020-12-17 NOTE — Progress Notes (Deleted)
Primary Care Physician: [name], MD  Primary Gastroenterologist:    No chief complaint on file.   HPI: Kelly Douglas is a 65 y.o. female here  Hemoglobin May 07, 2020 of 4.2 ***error   Colonoscopy October 2021: -1 tubular adenoma removed from the rectum -Diverticulosis -Next colonoscopy in 5 years  Current Outpatient Medications  Medication Sig Dispense Refill   acetaminophen (TYLENOL) 650 MG CR tablet Take 1,300 mg by mouth every 8 (eight) hours as needed for pain.      amLODipine (NORVASC) 5 MG tablet TAKE 1 TABLET EVERY DAY 90 tablet 2   atorvastatin (LIPITOR) 10 MG tablet TAKE 1 TABLET(10 MG) BY MOUTH DAILY 30 tablet 5   benzonatate (TESSALON) 100 MG capsule Take 1 capsule (100 mg total) by mouth 2 (two) times daily as needed for cough. 20 capsule 0   Cholecalciferol (VITAMIN D) 125 MCG (5000 UT) CAPS Take 5,000 Units by mouth daily.      DULoxetine (CYMBALTA) 60 MG capsule TAKE 1 CAPSULE EVERY DAY 90 capsule 0   levothyroxine (SYNTHROID) 75 MCG tablet TAKE 1 TABLET EVERY OTHER DAY ALTERNATING WITH TAKE 1/2 TABLET EVERY OTHER DAY 66 tablet 1   Magnesium Oxide 400 MG CAPS One capsule three times daily 270 capsule 1   meloxicam (MOBIC) 7.5 MG tablet TAKE 1 TABLET EVERY DAY 90 tablet 0   mirtazapine (REMERON) 7.5 MG tablet TAKE 1 TABLET AT BEDTIME 90 tablet 0   montelukast (SINGULAIR) 10 MG tablet TAKE 1 TABLET AT BEDTIME 90 tablet 1   Multiple Vitamin (MULTIVITAMIN WITH MINERALS) TABS tablet Take 1 tablet by mouth daily.     pantoprazole (PROTONIX) 20 MG tablet Take 1 tablet (20 mg total) by mouth daily. 30 tablet 5   pantoprazole (PROTONIX) 40 MG tablet TAKE 1 TABLET TWICE DAILY 180 tablet 0   PROAIR RESPICLICK 712 (90 Base) MCG/ACT AEPB Inhale 2 puffs into the lungs 4 (four) times daily as needed. 1 each 5   No current facility-administered medications for this visit.    Allergies as of 12/18/2020 - Review Complete 11/25/2020  Allergen Reaction  Noted   Aspirin Other (See Comments) 05/19/2015    ROS:  General: Negative for anorexia, weight loss, fever, chills, fatigue, weakness. ENT: Negative for hoarseness, difficulty swallowing , nasal congestion. CV: Negative for chest pain, angina, palpitations, dyspnea on exertion, peripheral edema.  Respiratory: Negative for dyspnea at rest, dyspnea on exertion, cough, sputum, wheezing.  GI: See history of present illness. GU:  Negative for dysuria, hematuria, urinary incontinence, urinary frequency, nocturnal urination.  Endo: Negative for unusual weight change.    Physical Examination:   LMP  (LMP Unknown)   General: Well-nourished, well-developed in no acute distress.  Eyes: No icterus. Mouth: Oropharyngeal mucosa moist and pink , no lesions erythema or exudate. Lungs: Clear to auscultation bilaterally.  Heart: Regular rate and rhythm, no murmurs rubs or gallops.  Abdomen: Bowel sounds are normal, nontender, nondistended, no hepatosplenomegaly or masses, no abdominal bruits or hernia , no rebound or guarding.   Extremities: No lower extremity edema. No clubbing or deformities. Neuro: Alert and oriented x 4   Skin: Warm and dry, no jaundice.   Psych: Alert and cooperative, normal mood and affect.  Labs:  Lab Results  Component Value Date   CREATININE 1.17 (H) 07/29/2020   BUN 8 07/29/2020   NA 141 07/29/2020   K 4.0 07/29/2020   CL 102 07/29/2020   CO2 25 07/29/2020  Lab Results  Component Value Date   ALT 8 07/29/2020   AST 13 07/29/2020   ALKPHOS 112 07/29/2020   BILITOT 0.5 07/29/2020   Lab Results  Component Value Date   WBC 11.3 (H) 05/07/2020   HGB 11.4 (L) 05/07/2020   HCT 35.1 (L) 05/07/2020   MCV 80.7 05/07/2020   PLT 474 (H) 05/07/2020    Lab Results  Component Value Date   VITAMINB12 421 02/24/2020   No results found for: FOLATE   Imaging Studies: No results found.   Assessment:     Plan:

## 2020-12-18 ENCOUNTER — Ambulatory Visit: Payer: Medicare HMO | Admitting: Gastroenterology

## 2020-12-18 ENCOUNTER — Encounter: Payer: Self-pay | Admitting: Internal Medicine

## 2020-12-24 DIAGNOSIS — Z0100 Encounter for examination of eyes and vision without abnormal findings: Secondary | ICD-10-CM | POA: Diagnosis not present

## 2021-01-02 ENCOUNTER — Other Ambulatory Visit: Payer: Self-pay | Admitting: Internal Medicine

## 2021-01-02 DIAGNOSIS — E782 Mixed hyperlipidemia: Secondary | ICD-10-CM

## 2021-01-31 ENCOUNTER — Other Ambulatory Visit: Payer: Self-pay | Admitting: Family Medicine

## 2021-02-11 ENCOUNTER — Other Ambulatory Visit: Payer: Self-pay | Admitting: Family Medicine

## 2021-03-02 ENCOUNTER — Ambulatory Visit: Payer: Medicare HMO | Admitting: Family Medicine

## 2021-03-03 ENCOUNTER — Other Ambulatory Visit: Payer: Self-pay | Admitting: Family Medicine

## 2021-03-03 DIAGNOSIS — Z9109 Other allergy status, other than to drugs and biological substances: Secondary | ICD-10-CM

## 2021-04-01 ENCOUNTER — Other Ambulatory Visit: Payer: Self-pay | Admitting: Family Medicine

## 2021-04-22 ENCOUNTER — Other Ambulatory Visit: Payer: Self-pay | Admitting: Internal Medicine

## 2021-04-22 ENCOUNTER — Other Ambulatory Visit: Payer: Self-pay | Admitting: Family Medicine

## 2021-04-22 DIAGNOSIS — M19019 Primary osteoarthritis, unspecified shoulder: Secondary | ICD-10-CM

## 2021-04-22 DIAGNOSIS — F32A Depression, unspecified: Secondary | ICD-10-CM

## 2021-05-12 ENCOUNTER — Other Ambulatory Visit: Payer: Self-pay | Admitting: Internal Medicine

## 2021-05-12 DIAGNOSIS — E038 Other specified hypothyroidism: Secondary | ICD-10-CM

## 2021-06-28 ENCOUNTER — Other Ambulatory Visit: Payer: Self-pay | Admitting: Family Medicine

## 2021-07-04 ENCOUNTER — Other Ambulatory Visit: Payer: Self-pay | Admitting: Family Medicine

## 2021-07-23 ENCOUNTER — Other Ambulatory Visit: Payer: Self-pay | Admitting: Internal Medicine

## 2021-07-23 DIAGNOSIS — I10 Essential (primary) hypertension: Secondary | ICD-10-CM

## 2021-08-03 ENCOUNTER — Encounter: Payer: Medicare HMO | Admitting: Family Medicine

## 2021-08-04 ENCOUNTER — Encounter: Payer: Self-pay | Admitting: Family Medicine

## 2021-08-12 ENCOUNTER — Other Ambulatory Visit: Payer: Self-pay | Admitting: Family Medicine

## 2021-08-12 DIAGNOSIS — E782 Mixed hyperlipidemia: Secondary | ICD-10-CM

## 2021-09-07 ENCOUNTER — Other Ambulatory Visit: Payer: Self-pay | Admitting: Family Medicine

## 2021-09-07 ENCOUNTER — Telehealth: Payer: Self-pay | Admitting: Family Medicine

## 2021-09-07 ENCOUNTER — Encounter: Payer: Medicare PPO | Admitting: Family Medicine

## 2021-09-07 DIAGNOSIS — E559 Vitamin D deficiency, unspecified: Secondary | ICD-10-CM

## 2021-09-07 DIAGNOSIS — I10 Essential (primary) hypertension: Secondary | ICD-10-CM

## 2021-09-07 DIAGNOSIS — E782 Mixed hyperlipidemia: Secondary | ICD-10-CM

## 2021-09-07 NOTE — Telephone Encounter (Signed)
Pt aware labs ordered

## 2021-09-07 NOTE — Telephone Encounter (Signed)
Pls order fasting cBC, lipid, cmp and EGFr , tSH and vit D for this pt, intends to get labs oin next 1 week, thanks

## 2021-09-10 DIAGNOSIS — E782 Mixed hyperlipidemia: Secondary | ICD-10-CM | POA: Diagnosis not present

## 2021-09-10 DIAGNOSIS — E559 Vitamin D deficiency, unspecified: Secondary | ICD-10-CM | POA: Diagnosis not present

## 2021-09-10 DIAGNOSIS — I10 Essential (primary) hypertension: Secondary | ICD-10-CM | POA: Diagnosis not present

## 2021-09-11 LAB — CBC
Hematocrit: 40.4 % (ref 34.0–46.6)
Hemoglobin: 13 g/dL (ref 11.1–15.9)
MCH: 24.9 pg — ABNORMAL LOW (ref 26.6–33.0)
MCHC: 32.2 g/dL (ref 31.5–35.7)
MCV: 77 fL — ABNORMAL LOW (ref 79–97)
Platelets: 378 10*3/uL (ref 150–450)
RBC: 5.23 x10E6/uL (ref 3.77–5.28)
RDW: 16.7 % — ABNORMAL HIGH (ref 11.7–15.4)
WBC: 11.6 10*3/uL — ABNORMAL HIGH (ref 3.4–10.8)

## 2021-09-11 LAB — TSH: TSH: 1.19 u[IU]/mL (ref 0.450–4.500)

## 2021-09-11 LAB — CMP14+EGFR
ALT: 17 IU/L (ref 0–32)
AST: 21 IU/L (ref 0–40)
Albumin/Globulin Ratio: 1.7 (ref 1.2–2.2)
Albumin: 4.7 g/dL (ref 3.9–4.9)
Alkaline Phosphatase: 157 IU/L — ABNORMAL HIGH (ref 44–121)
BUN/Creatinine Ratio: 8 — ABNORMAL LOW (ref 12–28)
BUN: 10 mg/dL (ref 8–27)
Bilirubin Total: 0.4 mg/dL (ref 0.0–1.2)
CO2: 22 mmol/L (ref 20–29)
Calcium: 10.3 mg/dL (ref 8.7–10.3)
Chloride: 103 mmol/L (ref 96–106)
Creatinine, Ser: 1.26 mg/dL — ABNORMAL HIGH (ref 0.57–1.00)
Globulin, Total: 2.7 g/dL (ref 1.5–4.5)
Glucose: 94 mg/dL (ref 70–99)
Potassium: 4.8 mmol/L (ref 3.5–5.2)
Sodium: 140 mmol/L (ref 134–144)
Total Protein: 7.4 g/dL (ref 6.0–8.5)
eGFR: 47 mL/min/{1.73_m2} — ABNORMAL LOW (ref >59)

## 2021-09-11 LAB — LIPID PANEL
Chol/HDL Ratio: 4 ratio (ref 0.0–4.4)
Cholesterol, Total: 156 mg/dL (ref 100–199)
HDL: 39 mg/dL — ABNORMAL LOW (ref >39)
LDL Chol Calc (NIH): 88 mg/dL (ref 0–99)
Triglycerides: 167 mg/dL — ABNORMAL HIGH (ref 0–149)
VLDL Cholesterol Cal: 29 mg/dL (ref 5–40)

## 2021-09-11 LAB — VITAMIN D 25 HYDROXY (VIT D DEFICIENCY, FRACTURES): Vit D, 25-Hydroxy: 33.8 ng/mL (ref 30.0–100.0)

## 2021-09-18 ENCOUNTER — Other Ambulatory Visit: Payer: Self-pay | Admitting: Internal Medicine

## 2021-09-18 ENCOUNTER — Other Ambulatory Visit: Payer: Self-pay | Admitting: Family Medicine

## 2021-09-18 DIAGNOSIS — M19019 Primary osteoarthritis, unspecified shoulder: Secondary | ICD-10-CM

## 2021-09-18 DIAGNOSIS — F32A Depression, unspecified: Secondary | ICD-10-CM

## 2021-09-23 ENCOUNTER — Ambulatory Visit (INDEPENDENT_AMBULATORY_CARE_PROVIDER_SITE_OTHER): Payer: Medicare PPO | Admitting: Family Medicine

## 2021-09-23 ENCOUNTER — Encounter: Payer: Self-pay | Admitting: Family Medicine

## 2021-09-23 VITALS — BP 128/82 | HR 98 | Resp 16 | Ht 68.0 in | Wt 167.2 lb

## 2021-09-23 DIAGNOSIS — Z1231 Encounter for screening mammogram for malignant neoplasm of breast: Secondary | ICD-10-CM

## 2021-09-23 DIAGNOSIS — Z0001 Encounter for general adult medical examination with abnormal findings: Secondary | ICD-10-CM

## 2021-09-23 DIAGNOSIS — F17218 Nicotine dependence, cigarettes, with other nicotine-induced disorders: Secondary | ICD-10-CM | POA: Diagnosis not present

## 2021-09-23 DIAGNOSIS — R911 Solitary pulmonary nodule: Secondary | ICD-10-CM | POA: Insufficient documentation

## 2021-09-23 DIAGNOSIS — Z23 Encounter for immunization: Secondary | ICD-10-CM | POA: Diagnosis not present

## 2021-09-23 DIAGNOSIS — Z0000 Encounter for general adult medical examination without abnormal findings: Secondary | ICD-10-CM | POA: Insufficient documentation

## 2021-09-23 NOTE — Assessment & Plan Note (Signed)
rept chest scan and refer to oncology , pt still not interested in surgery and is still smoking

## 2021-09-23 NOTE — Assessment & Plan Note (Signed)
Asked:confirms currently smokes cigarettes 10/day Assess: Unwilling to set a quit date, but is cutting back Advise: needs to QUIT to reduce risk of cancer, cardio and cerebrovascular disease Assist: counseled for 5 minutes and literature provided Arrange: follow up in 2 to 4 months

## 2021-09-23 NOTE — Assessment & Plan Note (Signed)

## 2021-09-23 NOTE — Patient Instructions (Addendum)
F/U in mid January, call if you need me sooner  Flu vaccine today  Fasting lipid, cmp and EGFR and magnesium level 5 days before next appointment  Please get shingrix vaccines and reconsider covid  booster  Mammogram and chest scan to be scheduled   I will refer you to dr Tera Helper, oncology after chest scan has been repeated  Now smoking 10 ciggs/ day, continue to cut back please  Thanks for choosing [facility], we consider it a privelige to serve you.

## 2021-09-23 NOTE — Progress Notes (Signed)
    Kelly Douglas     MRN: [id number]      DOB: Sep 07, 1955  HPI: Patient is in for annual physical exam. .screening and cancer follow up is past due and is done Nicotine cessation counseling done Recent labs,  are reviewed. Immunization is reviewed , and  updated    PE:BP 132/78   Pulse 98   Resp 16   Ht 5\' 8"  (1.727 m)   Wt 167 lb 3.2 oz (75.8 kg)   LMP  (LMP Unknown)   SpO2 93%   BMI 25.42 kg/m   Pleasant  female, alert and oriented x 3, in no cardio-pulmonary distress. Afebrile. HEENT No facial trauma or asymetry. Sinuses non tender.  Extra occullar muscles intact.. External ears normal, . Neck: supple, no adenopathy,JVD or thyromegaly.No bruits.  Chest: Clear to ascultation bilaterally.No crackles or wheezes. Non tender to palpation  Breast: No asymetry,no masses or lumps. No tenderness. No nipple discharge or inversion. No axillary or supraclavicular adenopathy  Cardiovascular system; Heart sounds normal,  S1 and  S2 ,no S3.  No murmur, or thrill. Apical beat not displaced Peripheral pulses normal.  Abdomen: Soft, non tender, no organomegaly or masses. No bruits. Bowel sounds normal. No guarding, tenderness or rebound.    Musculoskeletal exam: Full ROM of spine, hips , shoulders and knees. No deformity ,swelling or crepitus noted. No muscle wasting or atrophy.   Neurologic: Cranial nerves 2 to 12 intact. Power, tone ,sensation  normal throughout. No disturbance in gait. No tremor.  Skin: Intact, no ulceration, erythema , scaling or rash noted. Pigmentation normal throughout  Psych; Normal mood and affect. Judgement and concentration normal   Assessment & Plan:  Encounter for annual health examination Annual exam as documented. Counseling done  re healthy lifestyle involving commitment to 150 minutes exercise per week, heart healthy diet, and attaining healthy weight.The importance of adequate sleep also discussed. Regular seat belt use  and home safety, is also discussed. Changes in health habits are decided on by the patient with goals and time frames  set for achieving them. Immunization and cancer screening needs are specifically addressed at this visit.   Nicotine dependence Asked:confirms currently smokes cigarettes 10/day Assess: Unwilling to set a quit date, but is cutting back Advise: needs to QUIT to reduce risk of cancer, cardio and cerebrovascular disease Assist: counseled for 5 minutes and literature provided Arrange: follow up in 2 to 4 months   Lung nodule rept chest scan and refer to oncology , pt still not interested in surgery and is still smoking

## 2021-09-27 ENCOUNTER — Telehealth: Payer: Self-pay | Admitting: Family Medicine

## 2021-09-27 NOTE — Telephone Encounter (Signed)
Dawn from pre-service center called and said Ms Dowd dates are not correct on her authorization.  [employer] start date is wrong.  Please call today and follow up.  This message was left on my voice mail Friday PM when I was not in the clinic. Thanks

## 2021-09-28 NOTE — Telephone Encounter (Signed)
The precert auth date didn't start until 10/27. I called [employer] and got the auth start date of 9/13 to 10/13 and preservice center is aware

## 2021-09-29 ENCOUNTER — Ambulatory Visit (HOSPITAL_COMMUNITY)
Admission: RE | Admit: 2021-09-29 | Discharge: 2021-09-29 | Disposition: A | Discharge status: Home / Self Care | Payer: Medicare PPO | Source: Ambulatory Visit | Attending: Family Medicine | Admitting: Family Medicine

## 2021-09-29 DIAGNOSIS — R918 Other nonspecific abnormal finding of lung field: Secondary | ICD-10-CM | POA: Insufficient documentation

## 2021-09-29 DIAGNOSIS — R911 Solitary pulmonary nodule: Secondary | ICD-10-CM | POA: Insufficient documentation

## 2021-09-29 DIAGNOSIS — Z1231 Encounter for screening mammogram for malignant neoplasm of breast: Secondary | ICD-10-CM | POA: Insufficient documentation

## 2021-09-29 DIAGNOSIS — J439 Emphysema, unspecified: Secondary | ICD-10-CM | POA: Diagnosis not present

## 2021-09-29 DIAGNOSIS — I700 Atherosclerosis of aorta: Secondary | ICD-10-CM | POA: Insufficient documentation

## 2021-09-29 DIAGNOSIS — I2510 Atherosclerotic heart disease of native coronary artery without angina pectoris: Secondary | ICD-10-CM | POA: Diagnosis not present

## 2021-10-01 ENCOUNTER — Other Ambulatory Visit: Payer: Self-pay | Admitting: Family Medicine

## 2021-10-01 DIAGNOSIS — R918 Other nonspecific abnormal finding of lung field: Secondary | ICD-10-CM

## 2021-10-01 DIAGNOSIS — F17200 Nicotine dependence, unspecified, uncomplicated: Secondary | ICD-10-CM

## 2021-10-04 ENCOUNTER — Other Ambulatory Visit: Payer: Self-pay | Admitting: Family Medicine

## 2021-10-04 DIAGNOSIS — Z9109 Other allergy status, other than to drugs and biological substances: Secondary | ICD-10-CM

## 2021-10-12 ENCOUNTER — Inpatient Hospital Stay: Payer: Medicare PPO | Attending: Hematology | Admitting: Hematology

## 2021-10-12 VITALS — BP 143/78 | HR 106 | Temp 97.0°F | Resp 19 | Ht 69.0 in | Wt 167.4 lb

## 2021-10-12 DIAGNOSIS — Z801 Family history of malignant neoplasm of trachea, bronchus and lung: Secondary | ICD-10-CM | POA: Diagnosis not present

## 2021-10-12 DIAGNOSIS — F17210 Nicotine dependence, cigarettes, uncomplicated: Secondary | ICD-10-CM | POA: Diagnosis not present

## 2021-10-12 DIAGNOSIS — Z720 Tobacco use: Secondary | ICD-10-CM

## 2021-10-12 DIAGNOSIS — C3432 Malignant neoplasm of lower lobe, left bronchus or lung: Secondary | ICD-10-CM | POA: Insufficient documentation

## 2021-10-12 DIAGNOSIS — C3490 Malignant neoplasm of unspecified part of unspecified bronchus or lung: Secondary | ICD-10-CM

## 2021-10-12 DIAGNOSIS — C3492 Malignant neoplasm of unspecified part of left bronchus or lung: Secondary | ICD-10-CM

## 2021-10-12 NOTE — Patient Instructions (Signed)
Kelly Douglas  Discharge Instructions  You were seen and examined today by Dr. Delton Coombes.  Dr. Delton Coombes discussed your most recent lab work and CT scan which revealed that your spot on the lung has grown.   Dr. Delton Coombes recommends PET scan to see if cancer has spread and MRI of your brain to make sure has not spread to your brain.  Follow-up as scheduled after scans.    Thank you for choosing [facility] to provide your oncology and hematology care.   To afford each patient quality time with our provider, please arrive at least 15 minutes before your scheduled appointment time. You may need to reschedule your appointment if you arrive late (10 or more minutes). Arriving late affects you and other patients whose appointments are after yours.  Also, if you miss three or more appointments without notifying the office, you may be dismissed from the clinic at the provider's discretion.    Again, thank you for choosing [facility].  Our hope is that these requests will decrease the amount of time that you wait before being seen by our physicians.   If you have a lab appointment with the [facility] please come in thru the Main Entrance and check in at the main information desk.           _____________________________________________________________  Should you have questions after your visit to [facility], please contact our office at [phone] and follow the prompts.  Our office hours are 8:00 a.m. to 4:30 p.m. Monday - Thursday and 8:00 a.m. to 2:30 p.m. Friday.  Please note that voicemails left after 4:00 p.m. may not be returned until the following business day.  We are closed weekends and all major holidays.  You do have access to a nurse 24-7, just call the main number to the clinic [phone] and do not press any options, hold on the line and a nurse will answer the phone.    For prescription  refill requests, have your pharmacy contact our office and allow 72 hours.    Masks are optional in the cancer centers. If you would like for your care team to wear a mask while they are taking care of you, please let them know. You may have one support person who is at least 66 years old accompany you for your appointments.

## 2021-10-12 NOTE — Progress Notes (Signed)
[facility] [address], [place] [address]   CLINIC:  Medical Oncology/Hematology  CONSULT NOTE  Patient Care Team: [name], MD as PCP - General Rothbart, [name], MD (Inactive) (Cardiology) Gala Romney [name], MD (Gastroenterology) Teena Irani, PTA as Physical Therapy Assistant (Physical Therapy) Leta Baptist, MD as Consulting Physician (Otolaryngology)  CHIEF COMPLAINTS/PURPOSE OF CONSULTATION:  Follow-up of her left lung cancer.  HISTORY OF PRESENTING ILLNESS:  [name] 66 y.o. female is seen today for follow-up of left lung cancer at the request of Dr. Moshe Cipro.  She was initially seen by me in December 2021.  She was lost to follow-up since then.  She has seen Dr. Kipp Brood from [place] but failed to follow-up with him.  In the interim she denied any chest pains.  Denied any hemoptysis.  No B symptoms were reported.  She lives at home with her husband.  Her ADLs and IADLs are somewhat limited by arthritis of the hips.  She is continuing to smoke half pack per day of cigarettes.  Dr. Moshe Cipro has done a CT scan of the chest without contrast which showed continued increase in the left lower lobe lung cancer.  MEDICAL HISTORY:  Past Medical History:  Diagnosis Date   Anxiety    Arteriosclerotic cardiovascular disease (ASCVD) 2013   coronary calcification-left main, LAD and CX; small pericardial effusion   Arthritis    hips   Cancer (HCC)    Lung Cancer    Chronic back pain    Chronic bronchitis    Chronic lung disease    Chronic scarring and volume loss-left lung; characteristics of a chronic infectious process-possible MAI   Depression    Dyspnea    occasional    GERD (gastroesophageal reflux disease)    Dr Gala Romney EGD 09/2009->esophagitis, sm HH, antral erosions, atonic esophagus   Hyperlipidemia    Hypertension    Hypothyroid 1981 approx   Scleroderma ([place])    Seasonal allergies    Syncope    Multiple spells over the past 40+  years, likely neurocardiogenic   Tobacco abuse 06/24/2009    SURGICAL HISTORY: Past Surgical History:  Procedure Laterality Date   BILATERAL SALPINGOOPHORECTOMY  2013    Dr. Elonda Husky; uterus remains in situ   BRONCHIAL BIOPSY  11/21/2019   Procedure: BRONCHIAL BIOPSIES;  Surgeon: Collene Gobble, MD;  Location: [facility] ENDOSCOPY;  Service: Pulmonary;;   BRONCHIAL BRUSHINGS  11/21/2019   Procedure: BRONCHIAL BRUSHINGS;  Surgeon: Collene Gobble, MD;  Location: [facility] ENDOSCOPY;  Service: Pulmonary;;   BRONCHIAL NEEDLE ASPIRATION BIOPSY  11/21/2019   Procedure: BRONCHIAL NEEDLE ASPIRATION BIOPSIES;  Surgeon: Collene Gobble, MD;  Location[facility] ENDOSCOPY;  Service: Pulmonary;;   BRONCHIAL WASHINGS  11/21/2019   Procedure: BRONCHIAL WASHINGS;  Surgeon: Collene Gobble, MD;  Location: [facility] ENDOSCOPY;  Service: Pulmonary;;   COLONOSCOPY  09/28/09   anal papilla otherwise normal   COLONOSCOPY N/A 08/29/2012   WER:[id number] polyp-removed as described above. tubular adenoma   COLONOSCOPY WITH PROPOFOL N/A 11/11/2019   Procedure: COLONOSCOPY WITH PROPOFOL;  Surgeon: Daneil Dolin, MD;  Location: [facility] ENDO SUITE;  Service: Endoscopy;  Laterality: N/A;  2:30pm   ESOPHAGOGASTRODUODENOSCOPY  10/07/09   Dr. Barrie Dunker of esophageal mucosa, diffusely ?esophagitis (bx benign), small HH/antral erosions, erythema bx benign. 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 Hiatal hernia  FUDUCIAL PLACEMENT  10/18/2017   Procedure: PLACEMENT OF 3 FUDUCIAL INTO LEFT LOWER LOBE OF LUNG-TARGET 1;  Surgeon: Collene Gobble, MD;  Location: [facility];  Service: Thoracic;;   LAPAROSCOPIC APPENDECTOMY  07/16/2011   Procedure: APPENDECTOMY LAPAROSCOPIC;  Surgeon: Donato Heinz, MD;  Location: [facility] ORS;  Service: General;  Laterality: N/A;   POLYPECTOMY  11/11/2019    Procedure: POLYPECTOMY;  Surgeon: Daneil Dolin, MD;  Location: [facility] ENDO SUITE;  Service: Endoscopy;;   TUBAL LIGATION     VIDEO BRONCHOSCOPY  10/18/2017   VIDEO BRONCHOSCOPY WITH ENDOBRONCHIAL NAVIGATION N/A 10/18/2017   Procedure: VIDEO BRONCHOSCOPY WITH ENDOBRONCHIAL NAVIGATION;  Surgeon: Collene Gobble, MD;  Location: [facility];  Service: Thoracic;  Laterality: N/A;   VIDEO BRONCHOSCOPY WITH ENDOBRONCHIAL NAVIGATION Left 11/21/2019   Procedure: VIDEO BRONCHOSCOPY WITH ENDOBRONCHIAL NAVIGATION;  Surgeon: Collene Gobble, MD;  Location: [facility] ENDOSCOPY;  Service: Pulmonary;  Laterality: Left;    SOCIAL HISTORY: Social History   Socioeconomic History   Marital status: Married    Spouse name: Not on file   Number of children: 2   Years of education: Not on file   Highest education level: Not on file  Occupational History   Occupation: disabled    Employer: NOT EMPLOYED  Tobacco Use   Smoking status: Every Day    Packs/day: 0.50    Years: 45.00    Total pack years: 22.50    Types: Cigarettes    Start date: 07/14/1971   Smokeless tobacco: Never   Tobacco comments:    0.5 packs of cigarettes smoked daily ARJ 12/31/19  Vaping Use   Vaping Use: Never used  Substance and Sexual Activity   Alcohol use: Not Currently    Alcohol/week: 0.0 standard drinks of alcohol    Comment: quit June 2012-used to drink 6 pack daily, occasional beer per pt 11/20/19   Drug use: No   Sexual activity: Yes    Birth control/protection: Post-menopausal  Other Topics Concern   Not on file  Social History Narrative   Not on file   Social Determinants of Health   Financial Resource Strain: Low Risk  (11/25/2020)   Overall Financial Resource Strain (CARDIA)    Difficulty of Paying Living Expenses: Not hard at all  Food Insecurity: No Food Insecurity (11/25/2020)   Hunger Vital Sign    Worried About Running Out of Food in the Last Year: Never true    [place] in the Last Year: Never true   Transportation Needs: No Transportation Needs (11/25/2020)   PRAPARE - Hydrologist (Medical): No    Lack of Transportation (Non-Medical): No  Physical Activity: Insufficiently Active (11/25/2020)   Exercise Vital Sign    Days of Exercise per Week: 3 days    Minutes of Exercise per Session: 30 min  Stress: No Stress Concern Present (11/25/2020)   [place]    Feeling of Stress : Not at all  Social Connections: Moderately Integrated (11/25/2020)   Social Connection and Isolation Panel [NHANES]    Frequency of Communication with Friends and Family: Three times a week    Frequency of Social Gatherings with Friends and Family: Once a week    Attends Religious Services: More than 4 times per year    Active Member of [employer] or Organizations: No    Attends Archivist Meetings: Never    Marital Status: Married  Human resources officer  Violence: Not At Risk (11/25/2020)   Humiliation, Afraid, Rape, and Kick questionnaire    Fear of Current or Ex-Partner: No    Emotionally Abused: No    Physically Abused: No    Sexually Abused: No    FAMILY HISTORY: Family History  Problem Relation Age of Onset   Stroke Father    Aneurysm Father    Coronary artery disease Brother    Hypertension Brother    Down syndrome Brother    Hypertension Brother    Hypertension Brother        father/mother   Aneurysm Mother    Throat cancer Brother 82       throat cancer   Hypertension Sister    Diabetes Brother    Diabetes Brother    Anesthesia problems Neg Hx    Hypotension Neg Hx    Malignant hyperthermia Neg Hx    Pseudochol deficiency Neg Hx    Colon cancer Neg Hx     ALLERGIES:  is allergic to aspirin.  MEDICATIONS:  Current Outpatient Medications  Medication Sig Dispense Refill   acetaminophen (TYLENOL) 650 MG CR tablet Take 1,300 mg by mouth every 8 (eight) hours as needed for pain.       amLODipine (NORVASC) 5 MG tablet TAKE 1 TABLET EVERY DAY 90 tablet 2   atorvastatin (LIPITOR) 10 MG tablet TAKE 1 TABLET EVERY DAY 90 tablet 1   Cholecalciferol (VITAMIN D) 125 MCG (5000 UT) CAPS Take 5,000 Units by mouth daily.      DULoxetine (CYMBALTA) 60 MG capsule TAKE 1 CAPSULE EVERY DAY 90 capsule 0   famotidine (PEPCID) 20 MG tablet TAKE 1 TABLET EVERY DAY 90 tablet 2   levothyroxine (SYNTHROID) 75 MCG tablet TAKE 1 TABLET EVERY OTHER DAY  ALTERNATING  WITH TAKE 1/2 TABLET EVERY OTHER DAY 66 tablet 1   magnesium oxide (MAG-OX) 400 (240 Mg) MG tablet TAKE 1 TABLET THREE TIMES DAILY 270 tablet 2   mirtazapine (REMERON) 7.5 MG tablet TAKE 1 TABLET AT BEDTIME 90 tablet 0   montelukast (SINGULAIR) 10 MG tablet TAKE 1 TABLET AT BEDTIME 90 tablet 1   Multiple Vitamin (MULTIVITAMIN WITH MINERALS) TABS tablet Take 1 tablet by mouth daily.     pantoprazole (PROTONIX) 40 MG tablet TAKE 1 TABLET TWICE DAILY 180 tablet 0   PROAIR RESPICLICK 678 (90 Base) MCG/ACT AEPB Inhale 2 puffs into the lungs 4 (four) times daily as needed. 1 each 5   No current facility-administered medications for this visit.    REVIEW OF SYSTEMS:   Review of Systems  Cardiovascular:  Negative for chest pain.  Musculoskeletal:  Positive for arthralgias (hips & back pain d/t arthritis).  Neurological:  Positive for numbness.  All other systems reviewed and are negative.    PHYSICAL EXAMINATION: ECOG PERFORMANCE STATUS: 1 - Symptomatic but completely ambulatory  Vitals:   10/12/21 1508  BP: (!) 143/78  Pulse: (!) 106  Resp: 19  Temp: (!) 97 F (36.1 C)  SpO2: 100%   Filed Weights   10/12/21 1508  Weight: 167 lb 6.4 oz (75.9 kg)   Physical Exam Vitals reviewed.  Constitutional:      Appearance: Normal appearance.  Cardiovascular:     Rate and Rhythm: Normal rate and regular rhythm.     Pulses: Normal pulses.     Heart sounds: Normal heart sounds.  Pulmonary:     Effort: Pulmonary effort is normal.      Breath sounds: Normal breath sounds.  Abdominal:  Palpations: Abdomen is soft. There is no mass.  Neurological:     General: No focal deficit present.     Mental Status: She is alert and oriented to person, place, and time.  Psychiatric:        Mood and Affect: Mood normal.        Behavior: Behavior normal.      LABORATORY DATA:  I have reviewed the data as listed    Latest Ref Rng & Units 09/10/2021    2:49 PM 05/07/2020   12:00 PM 05/07/2020   11:09 AM  CBC  WBC 3.4 - 10.8 x10E3/uL 11.6  11.3  4.1   Hemoglobin 11.1 - 15.9 g/dL 13.0  11.4  4.2   Hematocrit 34.0 - 46.6 % 40.4  35.1  13.6   Platelets 150 - 450 x10E3/uL 378  474  147       Latest Ref Rng & Units 09/10/2021    2:49 PM 07/29/2020    3:26 PM 05/25/2020    3:50 PM  CMP  Glucose 70 - 99 mg/dL 94  83  84   BUN 8 - 27 mg/dL 10  8  7    Creatinine 0.57 - 1.00 mg/dL 1.26  1.17  0.89   Sodium 134 - 144 mmol/L 140  141  143   Potassium 3.5 - 5.2 mmol/L 4.8  4.0  3.9   Chloride 96 - 106 mmol/L 103  102  103   CO2 20 - 29 mmol/L 22  25  22    Calcium 8.7 - 10.3 mg/dL 10.3  10.1  9.6   Total Protein 6.0 - 8.5 g/dL 7.4  7.1    Total Bilirubin 0.0 - 1.2 mg/dL 0.4  0.5    Alkaline Phos 44 - 121 IU/L 157  112    AST 0 - 40 IU/L 21  13    ALT 0 - 32 IU/L 17  8     Cytology ([id number]) on 11/21/2019: Left lower lung lobe brushing and FNA: adenocarcinoma.  RADIOGRAPHIC STUDIES: I have personally reviewed the radiological images as listed and agreed with the findings in the report. CT Chest Wo Contrast  Result Date: 10/01/2021 CLINICAL DATA:  Follow-up pulmonary nodule EXAM: CT CHEST WITHOUT CONTRAST TECHNIQUE: Multidetector CT imaging of the chest was performed following the standard protocol without IV contrast. RADIATION DOSE REDUCTION: This exam was performed according to the departmental dose-optimization program which includes automated exposure control, adjustment of the mA and/or kV according to patient size and/or  use of iterative reconstruction technique. COMPARISON:  CT 04/24/2020, PET-CT 12/09/2019 FINDINGS: Cardiovascular: Normal heart size. No pericardial effusion. Thoracic aorta is nonaneurysmal. Scattered atherosclerotic vascular calcifications of the aorta and coronary arteries. Central pulmonary vasculature is nondilated. 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 Mediastinum/Nodes: No axillary or mediastinal lymphadenopathy. Evaluation of the hilar structures is limited in the absence of intravenous contrast. Within this limitation, no obvious hilar adenopathy or mass is identified. Thyroid, trachea, and esophagus demonstrate no significant findings. Esophagus is mildly patulous. Lungs/Pleura: Continued increase in size of irregular left lower lobe pulmonary nodule measuring 2.5 x 2.0 x 2.0 cm (series 5, image 97), previously measured 2.0 x 1.5 x 1.6 cm. 6 mm nodule in the left lower lobe anteriorly (series 5, image 95), not significantly changed. There are a few scattered areas of ground-glass opacity in the left lower lobe. Chronic postinflammatory scarring in the left upper lobe and left lung apex is unchanged. Chronic volume loss in the  left hemithorax. Background of emphysema. 3 mm right lower lobe pulmonary nodule (series 5, image 134). 5 mm right upper lobe pulmonary nodule (series 5, image 44), increased in size from prior. Scattered calcified granulomas. Upper Abdomen: No acute abnormality. Musculoskeletal: No chest wall mass or suspicious bone lesions identified. IMPRESSION: 1. Continued increase in size of irregular left lower lobe pulmonary nodule, now measuring up to 2.5 cm, previously measured 2.0 cm. Findings remain highly suspicious for malignancy. 2. Slight interval increase in size of a 5 mm right upper lobe pulmonary nodule. Attention on follow-up. 3. Scattered areas of ground-glass opacity in the left lower lobe, which may represent an infectious or inflammatory process. 4. Chronic  postinflammatory scarring in the left upper lobe and left lung apex. 5. 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 6. Aortic and coronary artery atherosclerosis (ICD10-I70.0). Electronically Signed   By: Davina Poke D.O.   On: 10/01/2021 08:42   MM 3D SCREEN BREAST BILATERAL  Result Date: 09/30/2021 CLINICAL DATA:  Screening. EXAM: DIGITAL SCREENING BILATERAL MAMMOGRAM WITH TOMOSYNTHESIS AND CAD TECHNIQUE: Bilateral screening digital craniocaudal and mediolateral oblique mammograms were obtained. Bilateral screening digital breast tomosynthesis was performed. The images were evaluated with computer-aided detection. COMPARISON:  Previous exam(s). ACR Breast Density Category b: There are scattered areas of fibroglandular density. FINDINGS: There are no findings suspicious for malignancy. IMPRESSION: No mammographic evidence of malignancy. A result letter of this screening mammogram will be mailed directly to the patient. RECOMMENDATION: Screening mammogram in one year. (Code:SM-B-01Y) BI-RADS CATEGORY  1: Negative. Electronically Signed   By: Everlean Alstrom M.D.   On: 09/30/2021 14:17     ASSESSMENT:  1.  Adenocarcinoma of left lower lobe of lung: -She is followed by Dr. Lamonte Sakai and left lower lobe lung nodule was being watched closely for the last few years. -She had increase in size of the left lower lobe lung nodule recently on CT scan. -PET scan on 12/09/2019 showed 2 cm left lower lobe pulmonary nodule with some solid elements with maximum SUV 4.7.  No adenopathy reported. -Bronchoscopy and biopsy of the left lower lobe nodule consistent with adenocarcinoma.  Left lower lobe brushings also positive for adenocarcinoma.  Left upper lobe brushings did not show any malignant cells.  2.  Social/family history: - Current active smoker, half pack per day since age 53. -Lives with husband and brother at home.  Activity is limited secondary to back and hip pain. -Brother had throat  cancer and paternal cousin had throat cancer.    PLAN:  1.  Clinical stage I adenocarcinoma of left lower lobe of lung: - She was lost to follow-up since December 2021.  She has seen Dr. Kipp Brood from [place] [facility].  Surgery was not offered as she was continuing to smoke. - She did not have any definitive treatment for her lung cancer. - CT chest (09/29/2021): Left lower lobe lung nodule measures 2.5 x 2 x 2 cm, previously 2 x 1.5 x 1.6 cm.  6 mm left lung nodule is stable.  Slight interval increase in size of a 5 mm right upper lobe lung nodule. - I have recommended restaging PET CT scan and brain MRI. - If there is no evidence of metastatic disease, I will refer her to radiation oncology for SBRT.  She does not want to quit smoking at this time.    All questions were answered. The patient knows to call the clinic with any problems, questions or concerns.   Derek Jack,  MD, 10/12/21 4:31 PM  [facility] [phone]

## 2021-10-21 ENCOUNTER — Ambulatory Visit (HOSPITAL_COMMUNITY)
Admission: RE | Admit: 2021-10-21 | Discharge: 2021-10-21 | Disposition: A | Discharge status: Home / Self Care | Payer: Medicare PPO | Source: Ambulatory Visit | Attending: Hematology | Admitting: Hematology

## 2021-10-21 DIAGNOSIS — C3492 Malignant neoplasm of unspecified part of left bronchus or lung: Secondary | ICD-10-CM | POA: Diagnosis not present

## 2021-10-21 DIAGNOSIS — C3490 Malignant neoplasm of unspecified part of unspecified bronchus or lung: Secondary | ICD-10-CM | POA: Insufficient documentation

## 2021-10-21 DIAGNOSIS — I700 Atherosclerosis of aorta: Secondary | ICD-10-CM | POA: Diagnosis not present

## 2021-10-21 DIAGNOSIS — R918 Other nonspecific abnormal finding of lung field: Secondary | ICD-10-CM | POA: Diagnosis not present

## 2021-10-21 DIAGNOSIS — I2510 Atherosclerotic heart disease of native coronary artery without angina pectoris: Secondary | ICD-10-CM | POA: Diagnosis not present

## 2021-10-21 DIAGNOSIS — J432 Centrilobular emphysema: Secondary | ICD-10-CM | POA: Diagnosis not present

## 2021-10-21 MED ORDER — FLUDEOXYGLUCOSE F - 18 (FDG) INJECTION
8.7000 | Freq: Once | INTRAVENOUS | Status: AC | PRN
  Administered 2021-10-21: 8.7 via INTRAVENOUS

## 2021-10-25 ENCOUNTER — Ambulatory Visit (HOSPITAL_COMMUNITY)
Admission: RE | Admit: 2021-10-25 | Discharge: 2021-10-25 | Disposition: A | Discharge status: Home / Self Care | Payer: Medicare PPO | Source: Ambulatory Visit | Attending: Hematology | Admitting: Hematology

## 2021-10-25 DIAGNOSIS — C3492 Malignant neoplasm of unspecified part of left bronchus or lung: Secondary | ICD-10-CM | POA: Insufficient documentation

## 2021-10-25 DIAGNOSIS — C3490 Malignant neoplasm of unspecified part of unspecified bronchus or lung: Secondary | ICD-10-CM | POA: Insufficient documentation

## 2021-10-25 DIAGNOSIS — I6381 Other cerebral infarction due to occlusion or stenosis of small artery: Secondary | ICD-10-CM | POA: Diagnosis not present

## 2021-10-25 DIAGNOSIS — H748X2 Other specified disorders of left middle ear and mastoid: Secondary | ICD-10-CM | POA: Diagnosis not present

## 2021-10-25 DIAGNOSIS — I6782 Cerebral ischemia: Secondary | ICD-10-CM | POA: Diagnosis not present

## 2021-10-25 MED ORDER — GADOBUTROL 1 MMOL/ML IV SOLN
7.5000 mL | Freq: Once | INTRAVENOUS | Status: AC | PRN
  Administered 2021-10-25: 7.5 mL via INTRAVENOUS

## 2021-11-01 ENCOUNTER — Inpatient Hospital Stay: Payer: Medicare PPO | Attending: Hematology | Admitting: Hematology

## 2021-11-01 ENCOUNTER — Telehealth: Payer: Self-pay | Admitting: Radiation Oncology

## 2021-11-01 ENCOUNTER — Other Ambulatory Visit: Payer: Self-pay

## 2021-11-01 VITALS — BP 141/80 | HR 110 | Temp 97.5°F | Resp 19 | Ht 69.0 in | Wt 171.1 lb

## 2021-11-01 DIAGNOSIS — F17210 Nicotine dependence, cigarettes, uncomplicated: Secondary | ICD-10-CM | POA: Diagnosis not present

## 2021-11-01 DIAGNOSIS — Z801 Family history of malignant neoplasm of trachea, bronchus and lung: Secondary | ICD-10-CM | POA: Diagnosis not present

## 2021-11-01 DIAGNOSIS — M349 Systemic sclerosis, unspecified: Secondary | ICD-10-CM | POA: Diagnosis not present

## 2021-11-01 DIAGNOSIS — Z79899 Other long term (current) drug therapy: Secondary | ICD-10-CM | POA: Diagnosis not present

## 2021-11-01 DIAGNOSIS — E785 Hyperlipidemia, unspecified: Secondary | ICD-10-CM | POA: Diagnosis not present

## 2021-11-01 DIAGNOSIS — M129 Arthropathy, unspecified: Secondary | ICD-10-CM | POA: Diagnosis not present

## 2021-11-01 DIAGNOSIS — C3492 Malignant neoplasm of unspecified part of left bronchus or lung: Secondary | ICD-10-CM

## 2021-11-01 DIAGNOSIS — I2510 Atherosclerotic heart disease of native coronary artery without angina pectoris: Secondary | ICD-10-CM | POA: Insufficient documentation

## 2021-11-01 DIAGNOSIS — G8929 Other chronic pain: Secondary | ICD-10-CM | POA: Diagnosis not present

## 2021-11-01 DIAGNOSIS — Z79890 Hormone replacement therapy (postmenopausal): Secondary | ICD-10-CM | POA: Insufficient documentation

## 2021-11-01 DIAGNOSIS — E039 Hypothyroidism, unspecified: Secondary | ICD-10-CM | POA: Insufficient documentation

## 2021-11-01 DIAGNOSIS — I10 Essential (primary) hypertension: Secondary | ICD-10-CM | POA: Insufficient documentation

## 2021-11-01 DIAGNOSIS — C3432 Malignant neoplasm of lower lobe, left bronchus or lung: Secondary | ICD-10-CM | POA: Insufficient documentation

## 2021-11-01 NOTE — Telephone Encounter (Signed)
10/16 @ 2:16 pm phones just rings/no answer could not leave voicemail memory full.  Call back later.

## 2021-11-01 NOTE — Progress Notes (Signed)
[facility] [address], [place] [address]   CLINIC:  Medical Oncology/Hematology  CONSULT NOTE  Patient Care Team: [name], MD as PCP - General Rothbart, [name], MD (Inactive) (Cardiology) Gala Romney [name], MD (Gastroenterology) Teena Irani, PTA as Physical Therapy Assistant (Physical Therapy) Leta Baptist, MD as Consulting Physician (Otolaryngology)  CHIEF COMPLAINTS/PURPOSE OF CONSULTATION:  Follow-up of her left lung cancer.  HISTORY OF PRESENTING ILLNESS:  [name] 66 y.o. female seen for follow-up of her left-sided lung cancer at the request of Dr. Moshe Cipro.  She is able to do all her ADLs and IADLs although limited by arthritis of the hips.  She is continuing to smoke half pack of cigarettes per day.  MEDICAL HISTORY:  Past Medical History:  Diagnosis Date   Anxiety    Arteriosclerotic cardiovascular disease (ASCVD) 2013   coronary calcification-left main, LAD and CX; small pericardial effusion   Arthritis    hips   Cancer (HCC)    Lung Cancer    Chronic back pain    Chronic bronchitis    Chronic lung disease    Chronic scarring and volume loss-left lung; characteristics of a chronic infectious process-possible MAI   Depression    Dyspnea    occasional    GERD (gastroesophageal reflux disease)    Dr Gala Romney EGD 09/2009->esophagitis, sm HH, antral erosions, atonic esophagus   Hyperlipidemia    Hypertension    Hypothyroid 1981 approx   Scleroderma ([place])    Seasonal allergies    Syncope    Multiple spells over the past 40+ years, likely neurocardiogenic   Tobacco abuse 06/24/2009    SURGICAL HISTORY: Past Surgical History:  Procedure Laterality Date   BILATERAL SALPINGOOPHORECTOMY  2013    Dr. Elonda Husky; uterus remains in situ   BRONCHIAL BIOPSY  11/21/2019   Procedure: BRONCHIAL BIOPSIES;  Surgeon: Collene Gobble, MD;  Location[facility] ENDOSCOPY;  Service: Pulmonary;;   BRONCHIAL BRUSHINGS  11/21/2019   Procedure:  BRONCHIAL BRUSHINGS;  Surgeon: Collene Gobble, MD;  Location[facility] ENDOSCOPY;  Service: Pulmonary;;   BRONCHIAL NEEDLE ASPIRATION BIOPSY  11/21/2019   Procedure: BRONCHIAL NEEDLE ASPIRATION BIOPSIES;  Surgeon: Collene Gobble, MD;  Location: [facility] ENDOSCOPY;  Service: Pulmonary;;   BRONCHIAL WASHINGS  11/21/2019   Procedure: BRONCHIAL WASHINGS;  Surgeon: Collene Gobble, MD;  Location: [facility] ENDOSCOPY;  Service: Pulmonary;;   COLONOSCOPY  09/28/09   anal papilla otherwise normal   COLONOSCOPY N/A 08/29/2012   XBD:[id number] polyp-removed as described above. tubular adenoma   COLONOSCOPY WITH PROPOFOL N/A 11/11/2019   Procedure: COLONOSCOPY WITH PROPOFOL;  Surgeon: Daneil Dolin, MD;  Location: [facility] ENDO SUITE;  Service: Endoscopy;  Laterality: N/A;  2:30pm   ESOPHAGOGASTRODUODENOSCOPY  10/07/09   Dr. Barrie Dunker of esophageal mucosa, diffusely ?esophagitis (bx benign), small HH/antral erosions, erythema bx benign. 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 Hiatal hernia   FUDUCIAL PLACEMENT  10/18/2017   Procedure: PLACEMENT OF 3 FUDUCIAL INTO LEFT LOWER LOBE OF LUNG-TARGET 1;  Surgeon: Collene Gobble, MD;  Location: [place];  Service: Thoracic;;   LAPAROSCOPIC APPENDECTOMY  07/16/2011   Procedure: APPENDECTOMY LAPAROSCOPIC;  Surgeon: Donato Heinz, MD;  Location: [facility] ORS;  Service: General;  Laterality: N/A;   POLYPECTOMY  11/11/2019   Procedure: POLYPECTOMY;  Surgeon: Daneil Dolin, MD;  Location: [facility] ENDO SUITE;  Service: Endoscopy;;   TUBAL LIGATION     VIDEO BRONCHOSCOPY  10/18/2017   VIDEO BRONCHOSCOPY WITH ENDOBRONCHIAL NAVIGATION N/A 10/18/2017   Procedure: VIDEO BRONCHOSCOPY WITH ENDOBRONCHIAL NAVIGATION;  Surgeon: Collene Gobble, MD;  Location: [facility] OR;  Service: Thoracic;  Laterality: N/A;   VIDEO BRONCHOSCOPY WITH  ENDOBRONCHIAL NAVIGATION Left 11/21/2019   Procedure: VIDEO BRONCHOSCOPY WITH ENDOBRONCHIAL NAVIGATION;  Surgeon: Collene Gobble, MD;  Location: [place] ENDOSCOPY;  Service: Pulmonary;  Laterality: Left;    SOCIAL HISTORY: Social History   Socioeconomic History   Marital status: Married    Spouse name: Not on file   Number of children: 2   Years of education: Not on file   Highest education level: Not on file  Occupational History   Occupation: disabled    Employer: NOT EMPLOYED  Tobacco Use   Smoking status: Every Day    Packs/day: 0.50    Years: 45.00    Total pack years: 22.50    Types: Cigarettes    Start date: 07/14/1971   Smokeless tobacco: Never   Tobacco comments:    0.5 packs of cigarettes smoked daily ARJ 12/31/19  Vaping Use   Vaping Use: Never used  Substance and Sexual Activity   Alcohol use: Not Currently    Alcohol/week: 0.0 standard drinks of alcohol    Comment: quit June 2012-used to drink 6 pack daily, occasional beer per pt 11/20/19   Drug use: No   Sexual activity: Yes    Birth control/protection: Post-menopausal  Other Topics Concern   Not on file  Social History Narrative   Not on file   Social Determinants of Health   Financial Resource Strain: Low Risk  (11/25/2020)   Overall Financial Resource Strain (CARDIA)    Difficulty of Paying Living Expenses: Not hard at all  Food Insecurity: No Food Insecurity (11/25/2020)   Hunger Vital Sign    Worried About Running Out of Food in the Last Year: Never true    [place] in the Last Year: Never true  Transportation Needs: No Transportation Needs (11/25/2020)   PRAPARE - Hydrologist (Medical): No    Lack of Transportation (Non-Medical): No  Physical Activity: Insufficiently Active (11/25/2020)   Exercise Vital Sign    Days of Exercise per Week: 3 days    Minutes of Exercise per Session: 30 min  Stress: No Stress Concern Present (11/25/2020)   [place]    Feeling of Stress : Not at all  Social Connections: Moderately Integrated (11/25/2020)   Social Connection and Isolation Panel [NHANES]    Frequency of Communication with Friends and Family: Three times a week    Frequency of Social Gatherings with Friends and Family: Once a week    Attends Religious Services: More than 4 times per year    Active Member of [employer] or Organizations: No    Attends Archivist Meetings: Never    Marital Status: Married  Human resources officer Violence: Not At Risk (11/25/2020)   Humiliation, Afraid, Rape, and Kick questionnaire    Fear of Current or Ex-Partner: No    Emotionally Abused: No    Physically Abused: No    Sexually Abused: No    FAMILY HISTORY: Family History  Problem Relation Age of Onset   Stroke Father    Aneurysm Father    Coronary artery disease Brother    Hypertension Brother    Down syndrome  Brother    Hypertension Brother    Hypertension Brother        father/mother   Aneurysm Mother    Throat cancer Brother 20       throat cancer   Hypertension Sister    Diabetes Brother    Diabetes Brother    Anesthesia problems Neg Hx    Hypotension Neg Hx    Malignant hyperthermia Neg Hx    Pseudochol deficiency Neg Hx    Colon cancer Neg Hx     ALLERGIES:  is allergic to aspirin.  MEDICATIONS:  Current Outpatient Medications  Medication Sig Dispense Refill   acetaminophen (TYLENOL) 650 MG CR tablet Take 1,300 mg by mouth every 8 (eight) hours as needed for pain.      amLODipine (NORVASC) 5 MG tablet TAKE 1 TABLET EVERY DAY 90 tablet 2   atorvastatin (LIPITOR) 10 MG tablet TAKE 1 TABLET EVERY DAY 90 tablet 1   Cholecalciferol (VITAMIN D) 125 MCG (5000 UT) CAPS Take 5,000 Units by mouth daily.      DULoxetine (CYMBALTA) 60 MG capsule TAKE 1 CAPSULE EVERY DAY 90 capsule 0   famotidine (PEPCID) 20 MG tablet TAKE 1 TABLET EVERY DAY 90 tablet 2   levothyroxine (SYNTHROID) 75  MCG tablet TAKE 1 TABLET EVERY OTHER DAY  ALTERNATING  WITH TAKE 1/2 TABLET EVERY OTHER DAY 66 tablet 1   magnesium oxide (MAG-OX) 400 (240 Mg) MG tablet TAKE 1 TABLET THREE TIMES DAILY 270 tablet 2   mirtazapine (REMERON) 7.5 MG tablet TAKE 1 TABLET AT BEDTIME 90 tablet 0   montelukast (SINGULAIR) 10 MG tablet TAKE 1 TABLET AT BEDTIME 90 tablet 1   Multiple Vitamin (MULTIVITAMIN WITH MINERALS) TABS tablet Take 1 tablet by mouth daily.     pantoprazole (PROTONIX) 40 MG tablet TAKE 1 TABLET TWICE DAILY 180 tablet 0   PROAIR RESPICLICK 867 (90 Base) MCG/ACT AEPB Inhale 2 puffs into the lungs 4 (four) times daily as needed. 1 each 5   No current facility-administered medications for this visit.    REVIEW OF SYSTEMS:   Review of Systems  Respiratory:  Positive for shortness of breath.   Cardiovascular:  Negative for chest pain.  Gastrointestinal:  Positive for constipation.  Musculoskeletal:  Negative for arthralgias.  Neurological:  Positive for numbness.  All other systems reviewed and are negative.    PHYSICAL EXAMINATION: ECOG PERFORMANCE STATUS: 1 - Symptomatic but completely ambulatory  Vitals:   11/01/21 1014  BP: (!) 141/80  Pulse: (!) 110  Resp: 19  Temp: (!) 97.5 F (36.4 C)  SpO2: 99%   Filed Weights   11/01/21 1014  Weight: 171 lb 1.6 oz (77.6 kg)   Physical Exam Vitals reviewed.  Constitutional:      Appearance: Normal appearance.  Cardiovascular:     Rate and Rhythm: Normal rate and regular rhythm.     Pulses: Normal pulses.     Heart sounds: Normal heart sounds.  Pulmonary:     Effort: Pulmonary effort is normal.     Breath sounds: Normal breath sounds.  Abdominal:     Palpations: Abdomen is soft. There is no mass.  Neurological:     General: No focal deficit present.     Mental Status: She is alert and oriented to person, place, and time.  Psychiatric:        Mood and Affect: Mood normal.        Behavior: Behavior normal.      LABORATORY DATA:  I have reviewed the data as listed    Latest Ref Rng & Units 09/10/2021    2:49 PM 05/07/2020   12:00 PM 05/07/2020   11:09 AM  CBC  WBC 3.4 - 10.8 x10E3/uL 11.6  11.3  4.1   Hemoglobin 11.1 - 15.9 g/dL 13.0  11.4  4.2   Hematocrit 34.0 - 46.6 % 40.4  35.1  13.6   Platelets 150 - 450 x10E3/uL 378  474  147       Latest Ref Rng & Units 09/10/2021    2:49 PM 07/29/2020    3:26 PM 05/25/2020    3:50 PM  CMP  Glucose 70 - 99 mg/dL 94  83  84   BUN 8 - 27 mg/dL 10  8  7    Creatinine 0.57 - 1.00 mg/dL 1.26  1.17  0.89   Sodium 134 - 144 mmol/L 140  141  143   Potassium 3.5 - 5.2 mmol/L 4.8  4.0  3.9   Chloride 96 - 106 mmol/L 103  102  103   CO2 20 - 29 mmol/L 22  25  22    Calcium 8.7 - 10.3 mg/dL 10.3  10.1  9.6   Total Protein 6.0 - 8.5 g/dL 7.4  7.1    Total Bilirubin 0.0 - 1.2 mg/dL 0.4  0.5    Alkaline Phos 44 - 121 IU/L 157  112    AST 0 - 40 IU/L 21  13    ALT 0 - 32 IU/L 17  8     Cytology ([id number]) on 11/21/2019: Left lower lung lobe brushing and FNA: adenocarcinoma.  RADIOGRAPHIC STUDIES: I have personally reviewed the radiological images as listed and agreed with the findings in the report. MR Brain W Wo Contrast  Result Date: 10/26/2021 CLINICAL DATA:  Provided history: Non-small cell lung cancer, monitor. EXAM: MRI HEAD WITHOUT AND WITH CONTRAST TECHNIQUE: Multiplanar, multiecho pulse sequences of the brain and surrounding structures were obtained without and with intravenous contrast. CONTRAST:  7.54mL GADAVIST GADOBUTROL 1 MMOL/ML IV SOLN COMPARISON:  Brain MRI 01/01/2020. FINDINGS: Brain: No age advanced or lobar predominant parenchymal atrophy. Redemonstrated chronic lacunar infarct within the anterior left subinsular white matter (series 9, image 26). As before, there are a few small foci of T2 FLAIR hyperintense signal abnormality scattered elsewhere within the cerebral white matter and pons, nonspecific but compatible with minimal changes of chronic small vessel  ischemia. There is no acute infarct. No evidence of an intracranial mass. No chronic intracranial blood products. No extra-axial fluid collection. No midline shift. No pathologic intracranial enhancement identified. Vascular: Maintained flow voids within the proximal large arterial vessels. Skull and upper cervical spine: No focal suspicious marrow lesion. Incompletely assessed cervical spondylosis. Sinuses/Orbits: No mass or acute finding within the imaged orbits. No significant paranasal sinus disease. Other: Left mastoid effusion. IMPRESSION: No evidence of intracranial metastatic disease. Stable chronic small vessel ischemic changes, as described. Left mastoid effusion. Electronically Signed   By: Kellie Simmering D.O.   On: 10/26/2021 08:21   NM PET Image Restag (PS) Skull Base To Thigh  Result Date: 10/24/2021 CLINICAL DATA:  Initial treatment strategy for left lower lobe lung nodule. EXAM: NUCLEAR MEDICINE PET SKULL BASE TO THIGH TECHNIQUE: 8.70 mCi F-18 FDG was injected intravenously. Full-ring PET imaging was performed from the skull base to thigh after the radiotracer. CT data was obtained and used for attenuation correction and anatomic localization. Fasting blood glucose: 99 mg/dl COMPARISON:  CT chest 09/29/2021 FINDINGS:  Mediastinal blood pool activity: SUV max 2.76 Liver activity: SUV max NA NECK: No hypermetabolic lymph nodes in the neck. Incidental CT findings: None. CHEST: Within the perihilar right upper lobe there is a solid nodule measuring 1.2 cm with SUV max 8.45, image 84/3. This appears similar in size to the CT from 09/29/2020. Small right upper lobe lung nodule is unchanged from 09/29/2021 measuring 5 mm, image 76/3. This is technically too small to reliably characterize by PET-CT with SUV max equal to 0.62. On the CT of the chest dated 04/24/2020 this nodule measured 2-3 mm. Persistent, irregular left lower lobe lung nodule measuring 2.4 x 2.2 cm with SUV max of 4.91, image 112/3. This is  not significantly changed compared with the CT from 09/29/2021. On the remote PET-CT from 12/09/2019 this nodule measured 2.0 by 1.0 cm and had an SUV max 4.7. No additional tracer avid pulmonary nodule or mass identified. No tracer avid axillary, supraclavicular, mediastinal, or hilar lymph nodes. Incidental CT findings: Mild centrilobular emphysema. Again seen is asymmetric left lung volume loss. Postinflammatory changes with scattered areas of nodular architectural distortion, ground-glass attenuation and scarring noted. The esophagus appears patulous with air-fluid level. Coronary artery calcifications. ABDOMEN/PELVIS: No abnormal hypermetabolic activity within the liver, pancreas, adrenal glands, or spleen. No hypermetabolic lymph nodes in the abdomen or pelvis. Incidental CT findings: Aortic atherosclerotic calcifications. SKELETON: No focal hypermetabolic activity to suggest skeletal metastasis. Incidental CT findings: None. IMPRESSION: 1. There is intense FDG uptake associated with the perihilar right upper lobe lung nodule which is suspicious for primary bronchogenic carcinoma. 2. Persistent irregular left lower lobe lung nodule exhibits moderate FDG uptake. This exhibits very mild interval increase in size with similar degree of uptake when compared with remote PET-CT from 12/09/2019. Low-grade indolent neoplastic process such as pulmonary adenocarcinoma cannot be excluded. 3. 5 mm right upper lobe lung nodule is technically too small to reliably characterize by PET-CT. This is unchanged when compared with 09/29/2021, but has increased from 2-3 mm on CT from 04/24/2020. Close interval surveillance is advised. 4. No signs of FDG avid nodal metastasis or distant metastatic disease. 5. Unchanged appearance of chronic postinflammatory changes with volume loss involving the left lung. 6. Coronary artery calcifications noted. 7. Patulous esophagus with air-fluid level. Correlate for any clinical signs or  symptoms of gastroesophageal reflux and/or esophagitis. 8. Aortic Atherosclerosis (ICD10-I70.0) and Emphysema (ICD10-J43.9). Electronically Signed   By: Kerby Moors M.D.   On: 10/24/2021 15:44     ASSESSMENT:  1.  Adenocarcinoma of left lower lobe of lung: -She is followed by Dr. Lamonte Sakai and left lower lobe lung nodule was being watched closely for the last few years. -She had increase in size of the left lower lobe lung nodule recently on CT scan. -PET scan on 12/09/2019 showed 2 cm left lower lobe pulmonary nodule with some solid elements with maximum SUV 4.7.  No adenopathy reported. -Bronchoscopy and biopsy of the left lower lobe nodule consistent with adenocarcinoma.  Left lower lobe brushings also positive for adenocarcinoma.  Left upper lobe brushings did not show any malignant cells.  2.  Social/family history: - Current active smoker, half pack per day since age 38. -Lives with husband and brother at home.  Activity is limited secondary to back and hip pain. -Brother had throat cancer and paternal cousin had throat cancer.    PLAN:  1.  Clinical stage I adenocarcinoma of left lower lobe of lung: - She was lost to follow-up since December 2021.  She was seen by Dr. Kipp Brood from [facility].  Surgery was not offered as she was continuing to smoke. - Brain MRI on 10/25/2021 was negative for metastatic disease. - PET scan on 10/21/2021 showed perihilar right upper lobe nodule suspicious for bronchogenic carcinoma.  Left lower lobe biopsy-proven nodule has mild interval increase in size with similar uptake.  5 mm right upper lobe nodule too small to be characterized on PET scan on increased 2 to 3 mm from CT scan from 04/24/2020.  No other signs of metastatic disease. - Recommend consultation with radiation oncology for possible SBRT. - RTC 4 months with CT scan 3 months after completing radiation.   All questions were answered. The patient knows to call the clinic with any problems,  questions or concerns.   Derek Jack, MD, 11/01/21 6:39 PM  [place] [phone]

## 2021-11-01 NOTE — Patient Instructions (Signed)
[place] [facility] Discharge Instructions   You were seen and examined today by Dr. Delton Coombes.  He reviewed the results of your PET scan. There is a new spot that is cancerous in your right lung.   We will arrange for you to have radiation in [facility] to treat the cancer in your left and right lungs. They will call you to arrange scheduling.   Return as scheduled.    Thank you for choosing [place] at [facility] to provide your oncology and hematology care.  To afford each patient quality time with our provider, please arrive at least 15 minutes before your scheduled appointment time.   If you have a lab appointment with the [facility] please come in thru the Main Entrance and check in at the main information desk.  You need to re-schedule your appointment should you arrive 10 or more minutes late.  We strive to give you quality time with our providers, and arriving late affects you and other patients whose appointments are after yours.  Also, if you no show three or more times for appointments you may be dismissed from the clinic at the providers discretion.     Again, thank you for choosing [facility].  Our hope is that these requests will decrease the amount of time that you wait before being seen by our physicians.       _____________________________________________________________  Should you have questions after your visit to [facility], please contact our office at [phone] and follow the prompts.  Our office hours are 8:00 a.m. and 4:30 p.m. Monday - Friday.  Please note that voicemails left after 4:00 p.m. may not be returned until the following business day.  We are closed weekends and major holidays.  You do have access to a nurse 24-7, just call the main number to the clinic [phone] and do not press any options, hold on the line and a nurse will answer the phone.    For prescription  refill requests, have your pharmacy contact our office and allow 72 hours.    Due to Covid, you will need to wear a mask upon entering the hospital. If you do not have a mask, a mask will be given to you at the Main Entrance upon arrival. For doctor visits, patients may have 1 support person age 37 or older with them. For treatment visits, patients can not have anyone with them due to social distancing guidelines and our immunocompromised population.

## 2021-11-03 NOTE — Progress Notes (Signed)
Thoracic Location of Tumor / Histology: Clinical stage I adenocarcinoma of left lower lobe of lung   Patient presented {numbers 1-12:19994} months ago with symptoms of: ***  Biopsies revealed:   11/21/2019 FINAL MICROSCOPIC DIAGNOSIS:  A. LUNG, LLL, BRUSHING:  - Malignant cells consistent with adenocarcinoma   B. LUNG, LLL, FINE NEEDLE ASPIRATION:  - Malignant cells consistent with adenocarcinoma   C. LUNG, LUL, BRUSHING:  Insufficient for diagnosis. The specimen is processed and examined  microscopically, but found to be unsatisfactory for evaluation.   Specimen Submitted:  D. LUNG, LUL, LAVAGE:    FINAL MICROSCOPIC DIAGNOSIS:  - No malignant cells identified   SPECIMEN ADEQUACY:  Satisfactory for evaluation   Tobacco/Marijuana/Snuff/ETOH use: Smokes half pack of cigarettes per day.   Past/Anticipated interventions by cardiothoracic surgery, if any: She was seen by Dr. Kipp Brood from [facility].  Surgery was not offered as she was continuing to smoke.   Past/Anticipated interventions by medical oncology, if any: no  Signs/Symptoms Weight changes, if any: {:18581} Respiratory complaints, if any: {:18581} Hemoptysis, if any: {:18581} Pain issues, if any:  {:18581}  SAFETY ISSUES: Prior radiation? {:18581} Pacemaker/ICD? {:18581}  Possible current pregnancy?no Is the patient on methotrexate? {:18581}  Current Complaints / other details:  ***

## 2021-11-04 ENCOUNTER — Encounter: Payer: Self-pay | Admitting: Radiation Oncology

## 2021-11-04 ENCOUNTER — Other Ambulatory Visit: Payer: Self-pay | Admitting: *Deleted

## 2021-11-04 ENCOUNTER — Ambulatory Visit
Admission: RE | Admit: 2021-11-04 | Discharge: 2021-11-04 | Disposition: A | Discharge status: Home / Self Care | Payer: Medicare PPO | Source: Ambulatory Visit | Attending: Radiation Oncology | Admitting: Radiation Oncology

## 2021-11-04 ENCOUNTER — Other Ambulatory Visit: Payer: Self-pay

## 2021-11-04 VITALS — BP 148/69 | HR 96 | Temp 98.2°F | Resp 18 | Ht 69.0 in | Wt 169.4 lb

## 2021-11-04 DIAGNOSIS — E785 Hyperlipidemia, unspecified: Secondary | ICD-10-CM | POA: Diagnosis not present

## 2021-11-04 DIAGNOSIS — R55 Syncope and collapse: Secondary | ICD-10-CM | POA: Diagnosis not present

## 2021-11-04 DIAGNOSIS — C3492 Malignant neoplasm of unspecified part of left bronchus or lung: Secondary | ICD-10-CM

## 2021-11-04 DIAGNOSIS — E039 Hypothyroidism, unspecified: Secondary | ICD-10-CM | POA: Insufficient documentation

## 2021-11-04 DIAGNOSIS — Z79899 Other long term (current) drug therapy: Secondary | ICD-10-CM | POA: Insufficient documentation

## 2021-11-04 DIAGNOSIS — Z79890 Hormone replacement therapy (postmenopausal): Secondary | ICD-10-CM | POA: Insufficient documentation

## 2021-11-04 DIAGNOSIS — M349 Systemic sclerosis, unspecified: Secondary | ICD-10-CM | POA: Diagnosis not present

## 2021-11-04 DIAGNOSIS — J449 Chronic obstructive pulmonary disease, unspecified: Secondary | ICD-10-CM | POA: Insufficient documentation

## 2021-11-04 DIAGNOSIS — C3432 Malignant neoplasm of lower lobe, left bronchus or lung: Secondary | ICD-10-CM | POA: Insufficient documentation

## 2021-11-04 DIAGNOSIS — R918 Other nonspecific abnormal finding of lung field: Secondary | ICD-10-CM | POA: Diagnosis not present

## 2021-11-04 DIAGNOSIS — I10 Essential (primary) hypertension: Secondary | ICD-10-CM | POA: Diagnosis not present

## 2021-11-04 DIAGNOSIS — I2510 Atherosclerotic heart disease of native coronary artery without angina pectoris: Secondary | ICD-10-CM | POA: Diagnosis not present

## 2021-11-04 DIAGNOSIS — F17210 Nicotine dependence, cigarettes, uncomplicated: Secondary | ICD-10-CM | POA: Diagnosis not present

## 2021-11-04 DIAGNOSIS — M47812 Spondylosis without myelopathy or radiculopathy, cervical region: Secondary | ICD-10-CM | POA: Insufficient documentation

## 2021-11-04 DIAGNOSIS — K219 Gastro-esophageal reflux disease without esophagitis: Secondary | ICD-10-CM | POA: Diagnosis not present

## 2021-11-04 DIAGNOSIS — I6782 Cerebral ischemia: Secondary | ICD-10-CM | POA: Diagnosis not present

## 2021-11-04 DIAGNOSIS — C3411 Malignant neoplasm of upper lobe, right bronchus or lung: Secondary | ICD-10-CM | POA: Diagnosis not present

## 2021-11-04 DIAGNOSIS — E559 Vitamin D deficiency, unspecified: Secondary | ICD-10-CM

## 2021-11-04 MED ORDER — VENTOLIN HFA 108 (90 BASE) MCG/ACT IN AERS: 1.0000 | INHALATION_SPRAY | Freq: Four times a day (QID) | RESPIRATORY_TRACT | 1 refills | Status: DC | PRN

## 2021-11-04 NOTE — Addendum Note (Signed)
Encounter addended by: Gery Pray, MD on: 11/04/2021 5:55 PM  Actions taken: Order list changed, Pharmacy for encounter modified

## 2021-11-04 NOTE — Progress Notes (Signed)
Radiation Oncology         [phone] ________________________________  Initial Outpatient Consultation  Name: Kelly Douglas MRN: [id number]  Date: 11/04/2021  DOB: [birth year]  ZC:HYIFOYD, Norwood Levo, MD  Derek Jack, MD   REFERRING PHYSICIAN: Derek Jack, MD  DIAGNOSIS: The encounter diagnosis was Adenocarcinoma of left lung, stage 1 ([place]).  Enlarging right upper lobe pulmonary nodule concerning for malignancy (October 2023). History of adenocarcinoma of the left lower lobe diagnosed in 2021; untreated    HISTORY OF PRESENT ILLNESS::Kelly Douglas is a 66 y.o. female current smoker who is seen as a courtesy of Dr. Delton Coombes for an opinion concerning radiation therapy as part of management for her recently diagnosed enlarging right upper lobe nodule. The patient has a history significant for adenocarcinoma of the left lower lobe diagnosed in 2021 which she did not receive any definitive treatment for. She was lost to follow-up since December 2021, though she did see Dr. Kipp Brood around that time for consideration of surgery. However given her ongoing smoking, she was not eligible for surgery. She has since resumed follow-up imaging and surveillance visits with Dr. Delton Coombes.   The patient recently presented for a follow-up chest CT on 09/29/21 which showed the left lower lobe nodule measuring 2.5 x 2 x 2 cm, previously measuring 2 x 1.5 x 1.6 cm. A 82mm left lung nodule otherwise appeared stable, and a slight interval increase in size was seen of a 5 mm right upper lobe nodule.   Given these findings, Dr. Delton Coombes recommended a restaging PET scan and MRI of the brain.   Restaging PET scan on 10/21/21 showed: intense FDG uptake associated with the perihilar right upper lobe lung nodule suspicious for primary bronchogenic carcinoma; a persistent irregular left lower lobe lung nodule with moderate FDG uptake and a mild increase in size; a very slight increase in  size of a 5 mm right upper lobe lung nodule technically too small to reliably characterize; unchanged appearance of chronic postinflammatory changes with volume loss involving the left lung; and no signs of FDG avid nodal metastatic disease or distant metastatic disease.   MRI of the brain on 10/25/21 showed no evidence of intracranial metastatic disease.   Given imaging findings, Dr. Delton Coombes referred the patient to me for consideration of possible SBRT to the enlarging RUL nodule.   Of note: the patient also had a bilateral screening mammogram on 09/29/21 which showed no mammographic evidence of malignancy in either breast.   PREVIOUS RADIATION THERAPY: No  PAST MEDICAL HISTORY:  Past Medical History:  Diagnosis Date   Anxiety    Arteriosclerotic cardiovascular disease (ASCVD) 2013   coronary calcification-left main, LAD and CX; small pericardial effusion   Arthritis    hips   Cancer (HCC)    Lung Cancer    Chronic back pain    Chronic bronchitis    Chronic lung disease    Chronic scarring and volume loss-left lung; characteristics of a chronic infectious process-possible MAI   Depression    Dyspnea    occasional    GERD (gastroesophageal reflux disease)    Dr Gala Romney EGD 09/2009->esophagitis, sm HH, antral erosions, atonic esophagus   Hyperlipidemia    Hypertension    Hypothyroid 1981 approx   Scleroderma ([place])    Seasonal allergies    Syncope    Multiple spells over the past 40+ years, likely neurocardiogenic   Tobacco abuse 06/24/2009    PAST SURGICAL HISTORY: Past Surgical History:  Procedure Laterality Date  BILATERAL SALPINGOOPHORECTOMY  2013    Dr. Elonda Husky; uterus remains in situ   BRONCHIAL BIOPSY  11/21/2019   Procedure: BRONCHIAL BIOPSIES;  Surgeon: Collene Gobble, MD;  Location: [facility] ENDOSCOPY;  Service: Pulmonary;;   BRONCHIAL BRUSHINGS  11/21/2019   Procedure: BRONCHIAL BRUSHINGS;  Surgeon: Collene Gobble, MD;  Location: [facility] ENDOSCOPY;  Service: Pulmonary;;    BRONCHIAL NEEDLE ASPIRATION BIOPSY  11/21/2019   Procedure: BRONCHIAL NEEDLE ASPIRATION BIOPSIES;  Surgeon: Collene Gobble, MD;  Location: [place];  Service: Pulmonary;;   BRONCHIAL WASHINGS  11/21/2019   Procedure: BRONCHIAL WASHINGS;  Surgeon: Collene Gobble, MD;  Location: [place];  Service: Pulmonary;;   COLONOSCOPY  09/28/09   anal papilla otherwise normal   COLONOSCOPY N/A 08/29/2012   WCB:JSEGBTD polyp-removed as described above. tubular adenoma   COLONOSCOPY WITH PROPOFOL N/A 11/11/2019   Procedure: COLONOSCOPY WITH PROPOFOL;  Surgeon: Daneil Dolin, MD;  Location: [facility];  Service: Endoscopy;  Laterality: N/A;  2:30pm   ESOPHAGOGASTRODUODENOSCOPY  10/07/09   Dr. Barrie Dunker of esophageal mucosa, diffusely ?esophagitis (bx benign), small HH/antral erosions, erythema bx benign. 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 Hiatal hernia   FUDUCIAL PLACEMENT  10/18/2017   Procedure: PLACEMENT OF 3 FUDUCIAL INTO LEFT LOWER LOBE OF LUNG-TARGET 1;  Surgeon: Collene Gobble, MD;  Location: [place];  Service: Thoracic;;   LAPAROSCOPIC APPENDECTOMY  07/16/2011   Procedure: APPENDECTOMY LAPAROSCOPIC;  Surgeon: Donato Heinz, MD;  Location: [facility];  Service: General;  Laterality: N/A;   POLYPECTOMY  11/11/2019   Procedure: POLYPECTOMY;  Surgeon: Daneil Dolin, MD;  Location: [facility];  Service: Endoscopy;;   TUBAL LIGATION     VIDEO BRONCHOSCOPY  10/18/2017   VIDEO BRONCHOSCOPY WITH ENDOBRONCHIAL NAVIGATION N/A 10/18/2017   Procedure: VIDEO BRONCHOSCOPY WITH ENDOBRONCHIAL NAVIGATION;  Surgeon: Collene Gobble, MD;  Location: [place];  Service: Thoracic;  Laterality: N/A;   VIDEO BRONCHOSCOPY WITH ENDOBRONCHIAL NAVIGATION Left 11/21/2019   Procedure: VIDEO BRONCHOSCOPY WITH ENDOBRONCHIAL NAVIGATION;   Surgeon: Collene Gobble, MD;  Location: [facility] ENDOSCOPY;  Service: Pulmonary;  Laterality: Left;    FAMILY HISTORY:  Family History  Problem Relation Age of Onset   Aneurysm Mother    Stroke Father    Aneurysm Father    Hypertension Sister    Cancer Brother        throat cancer   Coronary artery disease Brother    Hypertension Brother    Down syndrome Brother    Hypertension Brother    Hypertension Brother        father/mother   Throat cancer Brother 32       throat cancer   Diabetes Brother    Diabetes Brother    Anesthesia problems Neg Hx    Hypotension Neg Hx    Malignant hyperthermia Neg Hx    Pseudochol deficiency Neg Hx    Colon cancer Neg Hx     SOCIAL HISTORY:  Social History   Tobacco Use   Smoking status: Every Day    Packs/day: 0.50    Years: 45.00    Total pack years: 22.50    Types: Cigarettes    Start date: 07/14/1971   Smokeless tobacco: Never   Tobacco comments:    0.5 packs of cigarettes smoked daily ARJ 12/31/19  Vaping Use   Vaping Use: Never used  Substance Use  Topics   Alcohol use: Not Currently    Alcohol/week: 0.0 standard drinks of alcohol    Comment: quit June 2012-used to drink 6 pack daily, occasional beer per pt 11/20/19   Drug use: No    ALLERGIES:  Allergies  Allergen Reactions   Aspirin Other (See Comments)    Nose bleeds    MEDICATIONS:  Current Outpatient Medications  Medication Sig Dispense Refill   acetaminophen (TYLENOL) 650 MG CR tablet Take 1,300 mg by mouth every 8 (eight) hours as needed for pain.      amLODipine (NORVASC) 5 MG tablet TAKE 1 TABLET EVERY DAY 90 tablet 2   atorvastatin (LIPITOR) 10 MG tablet TAKE 1 TABLET EVERY DAY 90 tablet 1   Cholecalciferol (VITAMIN D) 125 MCG (5000 UT) CAPS Take 5,000 Units by mouth daily.      DULoxetine (CYMBALTA) 60 MG capsule TAKE 1 CAPSULE EVERY DAY 90 capsule 0   famotidine (PEPCID) 20 MG tablet TAKE 1 TABLET EVERY DAY 90 tablet 2   levothyroxine (SYNTHROID) 75 MCG tablet  TAKE 1 TABLET EVERY OTHER DAY  ALTERNATING  WITH TAKE 1/2 TABLET EVERY OTHER DAY 66 tablet 1   magnesium oxide (MAG-OX) 400 (240 Mg) MG tablet TAKE 1 TABLET THREE TIMES DAILY 270 tablet 2   mirtazapine (REMERON) 7.5 MG tablet TAKE 1 TABLET AT BEDTIME 90 tablet 0   montelukast (SINGULAIR) 10 MG tablet TAKE 1 TABLET AT BEDTIME 90 tablet 1   Multiple Vitamin (MULTIVITAMIN WITH MINERALS) TABS tablet Take 1 tablet by mouth daily.     pantoprazole (PROTONIX) 40 MG tablet TAKE 1 TABLET TWICE DAILY 180 tablet 0   PROAIR RESPICLICK 132 (90 Base) MCG/ACT AEPB Inhale 2 puffs into the lungs 4 (four) times daily as needed. 1 each 5   No current facility-administered medications for this encounter.    REVIEW OF SYSTEMS:  A 10+ POINT REVIEW OF SYSTEMS WAS OBTAINED including neurology, dermatology, psychiatry, cardiac, respiratory, lymph, extremities, GI, GU, musculoskeletal, constitutional, reproductive, HEENT.  She denies any pain in the chest area significant cough or hemoptysis.  She reports dyspnea on exertion and has run out of her ProAir inhaler.   PHYSICAL EXAM:  height is 5\' 9"  (1.753 m) and weight is 169 lb 6 oz (76.8 kg). Her oral temperature is 98.2 F (36.8 C). Her blood pressure is 148/69 (abnormal) and her pulse is 96. Her respiration is 18 and oxygen saturation is 100%.   General: Alert and oriented, in no acute distress HEENT: Head is normocephalic. Extraocular movements are intact.  Neck: Neck is supple, no palpable cervical or supraclavicular lymphadenopathy. Heart: Regular in rate and rhythm with no murmurs, rubs, or gallops. Chest: Clear to auscultation bilaterally, with no rhonchi, wheezes, or rales. Abdomen: Soft, nontender, nondistended, with no rigidity or guarding. Extremities: No cyanosis or edema. Lymphatics: see Neck Exam Skin: No concerning lesions. Musculoskeletal: symmetric strength and muscle tone throughout. Neurologic: Cranial nerves II through XII are grossly intact. No  obvious focalities. Speech is fluent. Coordination is intact. Psychiatric: Judgment and insight are intact. Affect is appropriate.   ECOG = 1  0 - Asymptomatic (Fully active, able to carry on all predisease activities without restriction)  1 - Symptomatic but completely ambulatory (Restricted in physically strenuous activity but ambulatory and able to carry out work of a light or sedentary nature. For example, light housework, office work)  2 - Symptomatic, <50% in bed during the day (Ambulatory and capable of all self care but unable to  carry out any work activities. Up and about more than 50% of waking hours)  3 - Symptomatic, >50% in bed, but not bedbound (Capable of only limited self-care, confined to bed or chair 50% or more of waking hours)  4 - Bedbound (Completely disabled. Cannot carry on any self-care. Totally confined to bed or chair)  5 - Death   Eustace Pen MM, Creech RH, Tormey DC, et al. [phone]). "Toxicity and response criteria of the [facility]". [place] Oncol. 5 (6): 649-55  LABORATORY DATA:  Lab Results  Component Value Date   WBC 11.6 (H) 09/10/2021   HGB 13.0 09/10/2021   HCT 40.4 09/10/2021   MCV 77 (L) 09/10/2021   PLT 378 09/10/2021   NEUTROABS 9.2 (H) 05/07/2020   Lab Results  Component Value Date   NA 140 09/10/2021   K 4.8 09/10/2021   CL 103 09/10/2021   CO2 22 09/10/2021   GLUCOSE 94 09/10/2021   BUN 10 09/10/2021   CREATININE 1.26 (H) 09/10/2021   CALCIUM 10.3 09/10/2021      RADIOGRAPHY: MR Brain W Wo Contrast  Result Date: 10/26/2021 CLINICAL DATA:  Provided history: Non-small cell lung cancer, monitor. EXAM: MRI HEAD WITHOUT AND WITH CONTRAST TECHNIQUE: Multiplanar, multiecho pulse sequences of the brain and surrounding structures were obtained without and with intravenous contrast. CONTRAST:  7.95mL GADAVIST GADOBUTROL 1 MMOL/ML IV SOLN COMPARISON:  Brain MRI 01/01/2020. FINDINGS: Brain: No age advanced or lobar  predominant parenchymal atrophy. Redemonstrated chronic lacunar infarct within the anterior left subinsular white matter (series 9, image 26). As before, there are a few small foci of T2 FLAIR hyperintense signal abnormality scattered elsewhere within the cerebral white matter and pons, nonspecific but compatible with minimal changes of chronic small vessel ischemia. There is no acute infarct. No evidence of an intracranial mass. No chronic intracranial blood products. No extra-axial fluid collection. No midline shift. No pathologic intracranial enhancement identified. Vascular: Maintained flow voids within the proximal large arterial vessels. Skull and upper cervical spine: No focal suspicious marrow lesion. Incompletely assessed cervical spondylosis. Sinuses/Orbits: No mass or acute finding within the imaged orbits. No significant paranasal sinus disease. Other: Left mastoid effusion. IMPRESSION: No evidence of intracranial metastatic disease. Stable chronic small vessel ischemic changes, as described. Left mastoid effusion. Electronically Signed   By: Kellie Simmering D.O.   On: 10/26/2021 08:21   NM PET Image Restag (PS) Skull Base To Thigh  Result Date: 10/24/2021 CLINICAL DATA:  Initial treatment strategy for left lower lobe lung nodule. EXAM: NUCLEAR MEDICINE PET SKULL BASE TO THIGH TECHNIQUE: 8.70 mCi F-18 FDG was injected intravenously. Full-ring PET imaging was performed from the skull base to thigh after the radiotracer. CT data was obtained and used for attenuation correction and anatomic localization. Fasting blood glucose: 99 mg/dl COMPARISON:  CT chest 09/29/2021 FINDINGS: Mediastinal blood pool activity: SUV max 2.76 Liver activity: SUV max NA NECK: No hypermetabolic lymph nodes in the neck. Incidental CT findings: None. CHEST: Within the perihilar right upper lobe there is a solid nodule measuring 1.2 cm with SUV max 8.45, image 84/3. This appears similar in size to the CT from 09/29/2020. Small  right upper lobe lung nodule is unchanged from 09/29/2021 measuring 5 mm, image 76/3. This is technically too small to reliably characterize by PET-CT with SUV max equal to 0.62. On the CT of the chest dated 04/24/2020 this nodule measured 2-3 mm. Persistent, irregular left lower lobe lung nodule measuring 2.4 x 2.2 cm  with SUV max of 4.91, image 112/3. This is not significantly changed compared with the CT from 09/29/2021. On the remote PET-CT from 12/09/2019 this nodule measured 2.0 by 1.0 cm and had an SUV max 4.7. No additional tracer avid pulmonary nodule or mass identified. No tracer avid axillary, supraclavicular, mediastinal, or hilar lymph nodes. Incidental CT findings: Mild centrilobular emphysema. Again seen is asymmetric left lung volume loss. Postinflammatory changes with scattered areas of nodular architectural distortion, ground-glass attenuation and scarring noted. The esophagus appears patulous with air-fluid level. Coronary artery calcifications. ABDOMEN/PELVIS: No abnormal hypermetabolic activity within the liver, pancreas, adrenal glands, or spleen. No hypermetabolic lymph nodes in the abdomen or pelvis. Incidental CT findings: Aortic atherosclerotic calcifications. SKELETON: No focal hypermetabolic activity to suggest skeletal metastasis. Incidental CT findings: None. IMPRESSION: 1. There is intense FDG uptake associated with the perihilar right upper lobe lung nodule which is suspicious for primary bronchogenic carcinoma. 2. Persistent irregular left lower lobe lung nodule exhibits moderate FDG uptake. This exhibits very mild interval increase in size with similar degree of uptake when compared with remote PET-CT from 12/09/2019. Low-grade indolent neoplastic process such as pulmonary adenocarcinoma cannot be excluded. 3. 5 mm right upper lobe lung nodule is technically too small to reliably characterize by PET-CT. This is unchanged when compared with 09/29/2021, but has increased from 2-3 mm  on CT from 04/24/2020. Close interval surveillance is advised. 4. No signs of FDG avid nodal metastasis or distant metastatic disease. 5. Unchanged appearance of chronic postinflammatory changes with volume loss involving the left lung. 6. Coronary artery calcifications noted. 7. Patulous esophagus with air-fluid level. Correlate for any clinical signs or symptoms of gastroesophageal reflux and/or esophagitis. 8. Aortic Atherosclerosis (ICD10-I70.0) and Emphysema (ICD10-J43.9). Electronically Signed   By: Kerby Moors M.D.   On: 10/24/2021 15:44      IMPRESSION: Enlarging right upper lobe pulmonary nodule concerning for malignancy (October 2023). History of adenocarcinoma of the left lower lobe diagnosed in 2021; untreated   We discussed the recent PET CT scanning findings in detail.  I offered to show the patient images that she did not wish to review these images.  We discussed that her left lower biopsy-proven lung cancer is active on the PET but has not grown significantly over the past several months.  She would be a candidate for SBRT directed at this lesion in the left lower lobe.  We also discussed the findings concerning her PET scan for the new area in the right upper lobe.  This is intensely hot on PET scan is consistent with the primary bronchogenic carcinoma.  I discussed  potential referral to Dr. Lamonte Sakai for biopsy of this area to confirm malignancy.  Given that this new area is likely another lung cancer she would prefer to go ahead and treat both areas at the same time and not consider work-up of the right upper lobe lesion with biopsy.  I discussed the general course of SBRT, associated side effects and potential long-term toxicities.  After careful consideration the patient would like to proceed with SBRT treating both the left lower lobe lesion as well as the right perihilar lesion.   PLAN: She is scheduled for CT simulation next week.  Anticipate between 3 and 5 SBRT sessions directed  at lesion in the left lower lobe and right perihilar region.   60 minutes of total time was spent for this patient encounter, including preparation, face-to-face counseling with the patient and coordination of care, physical exam, and documentation  of the encounter.   ------------------------------------------------  Blair Promise, PhD, MD  This document serves as a record of services personally performed by Gery Pray, MD. It was created on his behalf by Roney Mans, a trained medical scribe. The creation of this record is based on the scribe's personal observations and the provider's statements to them. This document has been checked and approved by the attending provider.

## 2021-11-11 ENCOUNTER — Ambulatory Visit
Admission: RE | Admit: 2021-11-11 | Discharge: 2021-11-11 | Disposition: A | Discharge status: Home / Self Care | Payer: Medicare PPO | Source: Ambulatory Visit | Attending: Radiation Oncology | Admitting: Radiation Oncology

## 2021-11-11 ENCOUNTER — Other Ambulatory Visit: Payer: Self-pay

## 2021-11-11 DIAGNOSIS — C3432 Malignant neoplasm of lower lobe, left bronchus or lung: Secondary | ICD-10-CM | POA: Diagnosis not present

## 2021-11-11 DIAGNOSIS — Z510 Encounter for antineoplastic radiation therapy: Secondary | ICD-10-CM | POA: Insufficient documentation

## 2021-11-11 DIAGNOSIS — R911 Solitary pulmonary nodule: Secondary | ICD-10-CM

## 2021-11-11 DIAGNOSIS — C3411 Malignant neoplasm of upper lobe, right bronchus or lung: Secondary | ICD-10-CM | POA: Diagnosis not present

## 2021-11-11 DIAGNOSIS — F17210 Nicotine dependence, cigarettes, uncomplicated: Secondary | ICD-10-CM | POA: Diagnosis not present

## 2021-11-17 DIAGNOSIS — C3411 Malignant neoplasm of upper lobe, right bronchus or lung: Secondary | ICD-10-CM | POA: Diagnosis not present

## 2021-11-17 DIAGNOSIS — F17210 Nicotine dependence, cigarettes, uncomplicated: Secondary | ICD-10-CM | POA: Diagnosis not present

## 2021-11-17 DIAGNOSIS — C3432 Malignant neoplasm of lower lobe, left bronchus or lung: Secondary | ICD-10-CM | POA: Diagnosis not present

## 2021-11-18 DIAGNOSIS — C3432 Malignant neoplasm of lower lobe, left bronchus or lung: Secondary | ICD-10-CM | POA: Diagnosis not present

## 2021-11-18 DIAGNOSIS — C3492 Malignant neoplasm of unspecified part of left bronchus or lung: Secondary | ICD-10-CM | POA: Insufficient documentation

## 2021-11-18 DIAGNOSIS — Z510 Encounter for antineoplastic radiation therapy: Secondary | ICD-10-CM | POA: Insufficient documentation

## 2021-11-18 DIAGNOSIS — C3411 Malignant neoplasm of upper lobe, right bronchus or lung: Secondary | ICD-10-CM | POA: Diagnosis not present

## 2021-11-18 DIAGNOSIS — F17210 Nicotine dependence, cigarettes, uncomplicated: Secondary | ICD-10-CM | POA: Diagnosis not present

## 2021-11-24 ENCOUNTER — Other Ambulatory Visit: Payer: Self-pay

## 2021-11-24 ENCOUNTER — Ambulatory Visit
Admission: RE | Admit: 2021-11-24 | Discharge: 2021-11-24 | Disposition: A | Discharge status: Home / Self Care | Payer: Medicare PPO | Source: Ambulatory Visit | Attending: Radiation Oncology | Admitting: Radiation Oncology

## 2021-11-24 DIAGNOSIS — F17210 Nicotine dependence, cigarettes, uncomplicated: Secondary | ICD-10-CM | POA: Diagnosis not present

## 2021-11-24 DIAGNOSIS — Z510 Encounter for antineoplastic radiation therapy: Secondary | ICD-10-CM | POA: Diagnosis not present

## 2021-11-24 DIAGNOSIS — C3411 Malignant neoplasm of upper lobe, right bronchus or lung: Secondary | ICD-10-CM | POA: Diagnosis not present

## 2021-11-24 DIAGNOSIS — C3432 Malignant neoplasm of lower lobe, left bronchus or lung: Secondary | ICD-10-CM | POA: Diagnosis not present

## 2021-11-24 DIAGNOSIS — R911 Solitary pulmonary nodule: Secondary | ICD-10-CM

## 2021-11-24 DIAGNOSIS — C3492 Malignant neoplasm of unspecified part of left bronchus or lung: Secondary | ICD-10-CM | POA: Diagnosis not present

## 2021-11-24 LAB — RAD ONC ARIA SESSION SUMMARY
Course Elapsed Days: 0
Plan Fractions Treated to Date: 1
Plan Fractions Treated to Date: 1
Plan Prescribed Dose Per Fraction: 18 Gy
Plan Prescribed Dose Per Fraction: 5 Gy
Plan Total Fractions Prescribed: 10
Plan Total Fractions Prescribed: 3
Plan Total Prescribed Dose: 50 Gy
Plan Total Prescribed Dose: 54 Gy
Reference Point Dosage Given to Date: 18 Gy
Reference Point Dosage Given to Date: 5 Gy
Reference Point Session Dosage Given: 18 Gy
Reference Point Session Dosage Given: 5 Gy
Session Number: 1

## 2021-11-25 ENCOUNTER — Other Ambulatory Visit: Payer: Self-pay

## 2021-11-25 ENCOUNTER — Ambulatory Visit
Admission: RE | Admit: 2021-11-25 | Discharge: 2021-11-25 | Disposition: A | Discharge status: Home / Self Care | Payer: Medicare PPO | Source: Ambulatory Visit | Attending: Radiation Oncology | Admitting: Radiation Oncology

## 2021-11-25 ENCOUNTER — Ambulatory Visit: Payer: Medicare PPO | Admitting: Radiation Oncology

## 2021-11-25 DIAGNOSIS — C3432 Malignant neoplasm of lower lobe, left bronchus or lung: Secondary | ICD-10-CM | POA: Diagnosis not present

## 2021-11-25 DIAGNOSIS — Z510 Encounter for antineoplastic radiation therapy: Secondary | ICD-10-CM | POA: Diagnosis not present

## 2021-11-25 DIAGNOSIS — C3492 Malignant neoplasm of unspecified part of left bronchus or lung: Secondary | ICD-10-CM | POA: Diagnosis not present

## 2021-11-25 DIAGNOSIS — C3411 Malignant neoplasm of upper lobe, right bronchus or lung: Secondary | ICD-10-CM | POA: Diagnosis not present

## 2021-11-25 DIAGNOSIS — F17210 Nicotine dependence, cigarettes, uncomplicated: Secondary | ICD-10-CM | POA: Diagnosis not present

## 2021-11-25 LAB — RAD ONC ARIA SESSION SUMMARY
Course Elapsed Days: 1
Plan Fractions Treated to Date: 2
Plan Prescribed Dose Per Fraction: 5 Gy
Plan Total Fractions Prescribed: 10
Plan Total Prescribed Dose: 50 Gy
Reference Point Dosage Given to Date: 10 Gy
Reference Point Session Dosage Given: 5 Gy
Session Number: 2

## 2021-11-26 ENCOUNTER — Ambulatory Visit
Admission: RE | Admit: 2021-11-26 | Discharge: 2021-11-26 | Disposition: A | Discharge status: Home / Self Care | Payer: Medicare PPO | Source: Ambulatory Visit | Attending: Radiation Oncology | Admitting: Radiation Oncology

## 2021-11-26 ENCOUNTER — Other Ambulatory Visit: Payer: Self-pay

## 2021-11-26 DIAGNOSIS — R911 Solitary pulmonary nodule: Secondary | ICD-10-CM

## 2021-11-26 DIAGNOSIS — Z510 Encounter for antineoplastic radiation therapy: Secondary | ICD-10-CM | POA: Diagnosis not present

## 2021-11-26 DIAGNOSIS — C3432 Malignant neoplasm of lower lobe, left bronchus or lung: Secondary | ICD-10-CM | POA: Diagnosis not present

## 2021-11-26 DIAGNOSIS — C3492 Malignant neoplasm of unspecified part of left bronchus or lung: Secondary | ICD-10-CM | POA: Diagnosis not present

## 2021-11-26 DIAGNOSIS — F17210 Nicotine dependence, cigarettes, uncomplicated: Secondary | ICD-10-CM | POA: Diagnosis not present

## 2021-11-26 DIAGNOSIS — C3411 Malignant neoplasm of upper lobe, right bronchus or lung: Secondary | ICD-10-CM | POA: Diagnosis not present

## 2021-11-26 LAB — RAD ONC ARIA SESSION SUMMARY
Course Elapsed Days: 2
Plan Fractions Treated to Date: 2
Plan Fractions Treated to Date: 3
Plan Prescribed Dose Per Fraction: 18 Gy
Plan Prescribed Dose Per Fraction: 5 Gy
Plan Total Fractions Prescribed: 10
Plan Total Fractions Prescribed: 3
Plan Total Prescribed Dose: 50 Gy
Plan Total Prescribed Dose: 54 Gy
Reference Point Dosage Given to Date: 15 Gy
Reference Point Dosage Given to Date: 36 Gy
Reference Point Session Dosage Given: 18 Gy
Reference Point Session Dosage Given: 5 Gy
Session Number: 3

## 2021-11-29 ENCOUNTER — Other Ambulatory Visit: Payer: Self-pay

## 2021-11-29 ENCOUNTER — Ambulatory Visit (INDEPENDENT_AMBULATORY_CARE_PROVIDER_SITE_OTHER): Payer: Medicare PPO

## 2021-11-29 ENCOUNTER — Ambulatory Visit
Admission: RE | Admit: 2021-11-29 | Discharge: 2021-11-29 | Disposition: A | Discharge status: Home / Self Care | Payer: Medicare PPO | Source: Ambulatory Visit | Attending: Radiation Oncology | Admitting: Radiation Oncology

## 2021-11-29 DIAGNOSIS — Z0000 Encounter for general adult medical examination without abnormal findings: Secondary | ICD-10-CM

## 2021-11-29 DIAGNOSIS — C3432 Malignant neoplasm of lower lobe, left bronchus or lung: Secondary | ICD-10-CM | POA: Diagnosis not present

## 2021-11-29 DIAGNOSIS — Z510 Encounter for antineoplastic radiation therapy: Secondary | ICD-10-CM | POA: Diagnosis not present

## 2021-11-29 DIAGNOSIS — F17210 Nicotine dependence, cigarettes, uncomplicated: Secondary | ICD-10-CM | POA: Diagnosis not present

## 2021-11-29 DIAGNOSIS — C3411 Malignant neoplasm of upper lobe, right bronchus or lung: Secondary | ICD-10-CM | POA: Diagnosis not present

## 2021-11-29 DIAGNOSIS — C3492 Malignant neoplasm of unspecified part of left bronchus or lung: Secondary | ICD-10-CM | POA: Diagnosis not present

## 2021-11-29 LAB — RAD ONC ARIA SESSION SUMMARY
Course Elapsed Days: 5
Plan Fractions Treated to Date: 4
Plan Prescribed Dose Per Fraction: 5 Gy
Plan Total Fractions Prescribed: 10
Plan Total Prescribed Dose: 50 Gy
Reference Point Dosage Given to Date: 20 Gy
Reference Point Session Dosage Given: 5 Gy
Session Number: 4

## 2021-11-29 NOTE — Progress Notes (Signed)
Subjective:   Kelly Douglas is a 66 y.o. female who presents for Medicare Annual (Subsequent) preventive examination. I connected with  [name] on 11/29/21 by a audio enabled telemedicine application and verified that I am speaking with the correct person using two identifiers.  Patient Location: Home  Provider Location: Office/Clinic  I discussed the limitations of evaluation and management by telemedicine. The patient expressed understanding and agreed to proceed.  Review of Systems           Objective:    There were no vitals filed for this visit. There is no height or weight on file to calculate BMI.     11/04/2021    2:46 PM 11/01/2021   10:15 AM 10/12/2021    3:17 PM 11/25/2020    8:31 AM 05/07/2020   10:14 AM 04/18/2020    8:21 PM 03/10/2020    8:24 PM  Advanced Directives  Does Patient Have a Medical Advance Directive? No No No No No No No  Would patient like information on creating a medical advance directive?  No - Patient declined No - Patient declined No - Patient declined   No - Patient declined    Current Medications (verified) Outpatient Encounter Medications as of 11/29/2021  Medication Sig   acetaminophen (TYLENOL) 650 MG CR tablet Take 1,300 mg by mouth every 8 (eight) hours as needed for pain.    albuterol (VENTOLIN HFA) 108 (90 Base) MCG/ACT inhaler Inhale 1-2 puffs into the lungs every 6 (six) hours as needed for wheezing or shortness of breath.   amLODipine (NORVASC) 5 MG tablet TAKE 1 TABLET EVERY DAY   atorvastatin (LIPITOR) 10 MG tablet TAKE 1 TABLET EVERY DAY   Cholecalciferol (VITAMIN D) 125 MCG (5000 UT) CAPS Take 5,000 Units by mouth daily.    DULoxetine (CYMBALTA) 60 MG capsule TAKE 1 CAPSULE EVERY DAY   famotidine (PEPCID) 20 MG tablet TAKE 1 TABLET EVERY DAY   levothyroxine (SYNTHROID) 75 MCG tablet TAKE 1 TABLET EVERY OTHER DAY  ALTERNATING  WITH TAKE 1/2 TABLET EVERY OTHER DAY   magnesium oxide (MAG-OX) 400 (240 Mg) MG tablet  TAKE 1 TABLET THREE TIMES DAILY   mirtazapine (REMERON) 7.5 MG tablet TAKE 1 TABLET AT BEDTIME   montelukast (SINGULAIR) 10 MG tablet TAKE 1 TABLET AT BEDTIME   Multiple Vitamin (MULTIVITAMIN WITH MINERALS) TABS tablet Take 1 tablet by mouth daily.   pantoprazole (PROTONIX) 40 MG tablet TAKE 1 TABLET TWICE DAILY   No facility-administered encounter medications on file as of 11/29/2021.    Allergies (verified) Aspirin   History: Past Medical History:  Diagnosis Date   Anxiety    Arteriosclerotic cardiovascular disease (ASCVD) 2013   coronary calcification-left main, LAD and CX; small pericardial effusion   Arthritis    hips   Cancer (HCC)    Lung Cancer    Chronic back pain    Chronic bronchitis    Chronic lung disease    Chronic scarring and volume loss-left lung; characteristics of a chronic infectious process-possible MAI   Depression    Dyspnea    occasional    GERD (gastroesophageal reflux disease)    Dr Gala Romney EGD 09/2009->esophagitis, sm HH, antral erosions, atonic esophagus   Hyperlipidemia    Hypertension    Hypothyroid 1981 approx   Scleroderma ([place])    Seasonal allergies    Syncope    Multiple spells over the past 40+ years, likely neurocardiogenic   Tobacco abuse 06/24/2009   Past Surgical  History:  Procedure Laterality Date   BILATERAL SALPINGOOPHORECTOMY  2013    Dr. Elonda Husky; uterus remains in situ   BRONCHIAL BIOPSY  11/21/2019   Procedure: BRONCHIAL BIOPSIES;  Surgeon: Collene Gobble, MD;  Location: [facility] ENDOSCOPY;  Service: Pulmonary;;   BRONCHIAL BRUSHINGS  11/21/2019   Procedure: BRONCHIAL BRUSHINGS;  Surgeon: Collene Gobble, MD;  Location: [facility] ENDOSCOPY;  Service: Pulmonary;;   BRONCHIAL NEEDLE ASPIRATION BIOPSY  11/21/2019   Procedure: BRONCHIAL NEEDLE ASPIRATION BIOPSIES;  Surgeon: Collene Gobble, MD;  Location: [place];  Service: Pulmonary;;   BRONCHIAL WASHINGS  11/21/2019   Procedure: BRONCHIAL WASHINGS;  Surgeon: Collene Gobble, MD;  Location: [place];  Service: Pulmonary;;   COLONOSCOPY  09/28/09   anal papilla otherwise normal   COLONOSCOPY N/A 08/29/2012   ZYS:AYTKZSW polyp-removed as described above. tubular adenoma   COLONOSCOPY WITH PROPOFOL N/A 11/11/2019   Procedure: COLONOSCOPY WITH PROPOFOL;  Surgeon: Daneil Dolin, MD;  Location: [facility] ENDO SUITE;  Service: Endoscopy;  Laterality: N/A;  2:30pm   ESOPHAGOGASTRODUODENOSCOPY  10/07/09   Dr. Barrie Dunker of esophageal mucosa, diffusely ?esophagitis (bx benign), small HH/antral erosions, erythema bx benign. 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 Hiatal hernia   FUDUCIAL PLACEMENT  10/18/2017   Procedure: PLACEMENT OF 3 FUDUCIAL INTO LEFT LOWER LOBE OF LUNG-TARGET 1;  Surgeon: Collene Gobble, MD;  Location: [place];  Service: Thoracic;;   LAPAROSCOPIC APPENDECTOMY  07/16/2011   Procedure: APPENDECTOMY LAPAROSCOPIC;  Surgeon: Donato Heinz, MD;  Location: [facility] ORS;  Service: General;  Laterality: N/A;   POLYPECTOMY  11/11/2019   Procedure: POLYPECTOMY;  Surgeon: Daneil Dolin, MD;  Location: [facility] ENDO SUITE;  Service: Endoscopy;;   TUBAL LIGATION     VIDEO BRONCHOSCOPY  10/18/2017   VIDEO BRONCHOSCOPY WITH ENDOBRONCHIAL NAVIGATION N/A 10/18/2017   Procedure: VIDEO BRONCHOSCOPY WITH ENDOBRONCHIAL NAVIGATION;  Surgeon: Collene Gobble, MD;  Location: [place];  Service: Thoracic;  Laterality: N/A;   VIDEO BRONCHOSCOPY WITH ENDOBRONCHIAL NAVIGATION Left 11/21/2019   Procedure: VIDEO BRONCHOSCOPY WITH ENDOBRONCHIAL NAVIGATION;  Surgeon: Collene Gobble, MD;  Location: [facility] ENDOSCOPY;  Service: Pulmonary;  Laterality: Left;   Family History  Problem Relation Age of Onset   Aneurysm Mother    Stroke Father    Aneurysm Father    Hypertension Sister    Cancer Brother        throat cancer   Coronary artery  disease Brother    Hypertension Brother    Down syndrome Brother    Hypertension Brother    Hypertension Brother        father/mother   Throat cancer Brother 50       throat cancer   Diabetes Brother    Diabetes Brother    Anesthesia problems Neg Hx    Hypotension Neg Hx    Malignant hyperthermia Neg Hx    Pseudochol deficiency Neg Hx    Colon cancer Neg Hx    Social History   Socioeconomic History   Marital status: Married    Spouse name: Not on file   Number of children: 2   Years of education: Not on file   Highest education level: Not on file  Occupational History   Occupation: disabled    Employer: NOT EMPLOYED  Tobacco Use   Smoking status: Every Day    Packs/day: 0.50    Years: 45.00  Total pack years: 22.50    Types: Cigarettes    Start date: 07/14/1971   Smokeless tobacco: Never   Tobacco comments:    0.5 packs of cigarettes smoked daily ARJ 12/31/19  Vaping Use   Vaping Use: Never used  Substance and Sexual Activity   Alcohol use: Not Currently    Alcohol/week: 0.0 standard drinks of alcohol    Comment: quit June 2012-used to drink 6 pack daily, occasional beer per pt 11/20/19   Drug use: No   Sexual activity: Yes    Birth control/protection: Post-menopausal  Other Topics Concern   Not on file  Social History Narrative   Not on file   Social Determinants of Health   Financial Resource Strain: Low Risk  (11/25/2020)   Overall Financial Resource Strain (CARDIA)    Difficulty of Paying Living Expenses: Not hard at all  Food Insecurity: No Food Insecurity (11/25/2020)   Hunger Vital Sign    Worried About Running Out of Food in the Last Year: Never true    Ran Out of Food in the Last Year: Never true  Transportation Needs: No Transportation Needs (11/25/2020)   PRAPARE - Hydrologist (Medical): No    Lack of Transportation (Non-Medical): No  Physical Activity: Insufficiently Active (11/25/2020)   Exercise Vital Sign     Days of Exercise per Week: 3 days    Minutes of Exercise per Session: 30 min  Stress: No Stress Concern Present (11/25/2020)   [place]    Feeling of Stress : Not at all  Social Connections: Moderately Integrated (11/25/2020)   Social Connection and Isolation Panel [NHANES]    Frequency of Communication with Friends and Family: Three times a week    Frequency of Social Gatherings with Friends and Family: Once a week    Attends Religious Services: More than 4 times per year    Active Member of Clubs or Organizations: No    Attends Archivist Meetings: Never    Marital Status: Married    Tobacco Counseling Ready to quit: Not Answered Counseling given: Not Answered Tobacco comments: 0.5 packs of cigarettes smoked daily ARJ 12/31/19   Clinical Intake:                 Diabetic?no         Activities of Daily Living     No data to display          Patient Care Team: [name], MD as PCP - General Rothbart, [name], MD (Inactive) (Cardiology) Gala Romney [name], MD (Gastroenterology) Teena Irani, PTA as Physical Therapy Assistant (Physical Therapy) Leta Baptist, MD as Consulting Physician (Otolaryngology)  Indicate any recent Medical Services you may have received from other than [facility] providers in the past year (date may be approximate).     Assessment:   This is a routine wellness examination for Kelly Douglas.  Hearing/Vision screen No results found.  Dietary issues and exercise activities discussed:     Goals Addressed   None    Depression Screen    09/23/2021    3:05 PM 11/25/2020    8:32 AM 11/25/2020    8:30 AM 10/29/2020    1:11 PM 07/29/2020    2:42 PM 05/25/2020    2:54 PM 03/26/2020   11:10 AM  PHQ 2/9 Scores  PHQ - 2 Score 0 0 0 0 0 0 0    Fall Risk  09/23/2021    3:04 PM 11/25/2020    8:31 AM 10/29/2020    1:10 PM 07/29/2020    2:42 PM 05/25/2020    2:54 PM   Fall Risk   Falls in the past year? 0 0 1 0 1  Number falls in past yr: 0 0 1 0 1  Injury with Fall? 0 0 0 0 1  Risk for fall due to :  No Fall Risks   Impaired balance/gait  Follow up  Falls evaluation completed       FALL RISK PREVENTION PERTAINING TO THE HOME:  Any stairs in or around the home? Yes  If so, are there any without handrails? No  Home free of loose throw rugs in walkways, pet beds, electrical cords, etc? Yes  Adequate lighting in your home to reduce risk of falls? Yes   ASSISTIVE DEVICES UTILIZED TO PREVENT FALLS:  Life alert? No  Use of a cane, walker or w/c? No  Grab bars in the bathroom? No  Shower chair or bench in shower? No  Elevated toilet seat or a handicapped toilet? No   TIMED UP AND GO:  Was the test performed? No .  Length of time to ambulate 10 feet:  sec.     Cognitive Function:    11/25/2020    8:32 AM  MMSE - Mini Mental State Exam  Not completed: Unable to complete        11/25/2020    8:32 AM 11/20/2019    2:42 PM 07/05/2018    8:29 AM 07/03/2017    3:05 PM 03/01/2016   10:41 AM  6CIT Screen  What Year? 4 points 0 points 0 points 0 points 0 points  What month? 0 points 0 points 0 points 0 points 0 points  What time? 0 points 0 points 0 points 0 points 0 points  Count back from 20 4 points 0 points 0 points 0 points 0 points  Months in reverse 0 points 2 points 0 points 0 points 0 points  Repeat phrase 4 points 0 points 0 points 0 points 0 points  Total Score 12 points 2 points 0 points 0 points 0 points    Immunizations Immunization History  Administered Date(s) Administered   Fluad Quad(high Dose 65+) 09/23/2021   Influenza Split 10/12/2011   Influenza Whole 10/13/2009   Influenza,inj,Quad PF,6+ Mos 11/01/2012, 09/16/2013, 11/20/2014, 10/27/2015, 11/28/2016, 09/26/2017, 10/30/2018, 09/18/2019   Pneumococcal Conjugate-13 12/20/2013   Pneumococcal Polysaccharide-23 10/30/2018   Td 06/24/2009   Tdap 11/17/2010    TDAP  status: Due, Education has been provided regarding the importance of this vaccine. Advised may receive this vaccine at local pharmacy or Health Dept. Aware to provide a copy of the vaccination record if obtained from local pharmacy or Health Dept. Verbalized acceptance and understanding.  Flu Vaccine status: Up to date  Pneumococcal vaccine status: Up to date  Covid-19 vaccine status: Information provided on how to obtain vaccines.   Qualifies for Shingles Vaccine? Yes   Zostavax completed No   Shingrix Completed?: No.    Education has been provided regarding the importance of this vaccine. Patient has been advised to call insurance company to determine out of pocket expense if they have not yet received this vaccine. Advised may also receive vaccine at local pharmacy or Health Dept. Verbalized acceptance and understanding.  Screening Tests Health Maintenance  Topic Date Due   COVID-19 Vaccine (1) Never done   Zoster Vaccines- Shingrix (1 of 2) Never done  TETANUS/TDAP  11/16/2020   Medicare Annual Wellness (AWV)  11/25/2021   MAMMOGRAM  09/30/2023   Pneumonia Vaccine 49+ Years old (3 - PPSV23 or PCV20) 10/30/2023   COLONOSCOPY (Pts 45-25yrs Insurance coverage will need to be confirmed)  11/10/2029   INFLUENZA VACCINE  Completed   DEXA SCAN  Completed   Hepatitis C Screening  Completed   HPV VACCINES  Aged Out    Health Maintenance  Health Maintenance Due  Topic Date Due   COVID-19 Vaccine (1) Never done   Zoster Vaccines- Shingrix (1 of 2) Never done   TETANUS/TDAP  11/16/2020   Medicare Annual Wellness (AWV)  11/25/2021    Colorectal cancer screening: Type of screening: Colonoscopy. Completed 11/11/19. Repeat every 10 years  Mammogram status: Completed 09/29/21. Repeat every year  Bone Density status: Completed 09/03/20. Results reflect: Bone density results: OSTEOPOROSIS. Repeat every 0 years.  Lung Cancer Screening: (Low Dose CT Chest recommended if Age 8-80 years, 30  pack-year currently smoking OR have quit w/in 15years.) does qualify.   Lung Cancer Screening Referral: N/a  Additional Screening:  Hepatitis C Screening: does not qualify; Completed 03/09/09  Vision Screening: Recommended annual ophthalmology exams for early detection of glaucoma and other disorders of the eye. Is the patient up to date with their annual eye exam?  No  Who is the provider or what is the name of the office in which the patient attends annual eye exams? My eye doctor If pt is not established with a provider, would they like to be referred to a provider to establish care? No .   Dental Screening: Recommended annual dental exams for proper oral hygiene  Community Resource Referral / Chronic Care Management: CRR required this visit?  No   CCM required this visit?  No      Plan:     I have personally reviewed and noted the following in the patient's chart:   Medical and social history Use of alcohol, tobacco or illicit drugs  Current medications and supplements including opioid prescriptions. Patient is not currently taking opioid prescriptions. Functional ability and status Nutritional status Physical activity Advanced directives List of other physicians Hospitalizations, surgeries, and ER visits in previous 12 months Vitals Screenings to include cognitive, depression, and falls Referrals and appointments  In addition, I have reviewed and discussed with patient certain preventive protocols, quality metrics, and best practice recommendations. A written personalized care plan for preventive services as well as general preventive health recommendations were provided to patient.     Jill Side, [place]   11/29/2021   Nurse Notes:

## 2021-11-29 NOTE — Patient Instructions (Signed)
  Kelly Douglas , Thank you for taking time to come for your Medicare Wellness Visit. I appreciate your ongoing commitment to your health goals. Please review the following plan we discussed and let me know if I can assist you in the future.   These are the goals we discussed:  Goals      Patient Stated     Patient would like to stop smoking     Patient Stated     Patient states that her goal is to bet her cancer and try to quit smoking.     Quit smoking / using tobacco     Patient would like to quit smoking. Recommend smoking cessation classes.        This is a list of the screening recommended for you and due dates:  Health Maintenance  Topic Date Due   COVID-19 Vaccine (1) Never done   Zoster (Shingles) Vaccine (1 of 2) Never done   Tetanus Vaccine  11/16/2020   Medicare Annual Wellness Visit  11/30/2022   Mammogram  09/30/2023   Pneumonia Vaccine (3 - PPSV23 or PCV20) 10/30/2023   Colon Cancer Screening  11/10/2029   Flu Shot  Completed   DEXA scan (bone density measurement)  Completed   Hepatitis C Screening: USPSTF Recommendation to screen - Ages 18-79 yo.  Completed   HPV Vaccine  Aged Out

## 2021-11-30 ENCOUNTER — Other Ambulatory Visit: Payer: Self-pay

## 2021-11-30 ENCOUNTER — Ambulatory Visit: Admission: RE | Admit: 2021-11-30 | Payer: Medicare PPO | Source: Ambulatory Visit | Admitting: Radiation Oncology

## 2021-11-30 ENCOUNTER — Ambulatory Visit
Admission: RE | Admit: 2021-11-30 | Discharge: 2021-11-30 | Disposition: A | Discharge status: Home / Self Care | Payer: Medicare PPO | Source: Ambulatory Visit | Attending: Radiation Oncology | Admitting: Radiation Oncology

## 2021-11-30 DIAGNOSIS — C3492 Malignant neoplasm of unspecified part of left bronchus or lung: Secondary | ICD-10-CM | POA: Diagnosis not present

## 2021-11-30 DIAGNOSIS — C3411 Malignant neoplasm of upper lobe, right bronchus or lung: Secondary | ICD-10-CM | POA: Diagnosis not present

## 2021-11-30 DIAGNOSIS — R911 Solitary pulmonary nodule: Secondary | ICD-10-CM

## 2021-11-30 DIAGNOSIS — F17210 Nicotine dependence, cigarettes, uncomplicated: Secondary | ICD-10-CM | POA: Diagnosis not present

## 2021-11-30 DIAGNOSIS — C3432 Malignant neoplasm of lower lobe, left bronchus or lung: Secondary | ICD-10-CM | POA: Diagnosis not present

## 2021-11-30 DIAGNOSIS — Z510 Encounter for antineoplastic radiation therapy: Secondary | ICD-10-CM | POA: Diagnosis not present

## 2021-11-30 LAB — RAD ONC ARIA SESSION SUMMARY
Course Elapsed Days: 6
Plan Fractions Treated to Date: 5
Plan Prescribed Dose Per Fraction: 5 Gy
Plan Total Fractions Prescribed: 10
Plan Total Prescribed Dose: 50 Gy
Reference Point Dosage Given to Date: 25 Gy
Reference Point Session Dosage Given: 5 Gy
Session Number: 5

## 2021-12-01 ENCOUNTER — Telehealth: Payer: Self-pay | Admitting: Oncology

## 2021-12-01 ENCOUNTER — Other Ambulatory Visit: Payer: Self-pay

## 2021-12-01 ENCOUNTER — Ambulatory Visit
Admission: RE | Admit: 2021-12-01 | Discharge: 2021-12-01 | Disposition: A | Discharge status: Home / Self Care | Payer: Medicare PPO | Source: Ambulatory Visit | Attending: Radiation Oncology | Admitting: Radiation Oncology

## 2021-12-01 ENCOUNTER — Ambulatory Visit: Payer: Medicare PPO | Admitting: Radiation Oncology

## 2021-12-01 ENCOUNTER — Other Ambulatory Visit: Payer: Self-pay | Admitting: Family Medicine

## 2021-12-01 DIAGNOSIS — Z510 Encounter for antineoplastic radiation therapy: Secondary | ICD-10-CM | POA: Diagnosis not present

## 2021-12-01 DIAGNOSIS — C3432 Malignant neoplasm of lower lobe, left bronchus or lung: Secondary | ICD-10-CM | POA: Diagnosis not present

## 2021-12-01 DIAGNOSIS — C3492 Malignant neoplasm of unspecified part of left bronchus or lung: Secondary | ICD-10-CM | POA: Diagnosis not present

## 2021-12-01 DIAGNOSIS — F17210 Nicotine dependence, cigarettes, uncomplicated: Secondary | ICD-10-CM | POA: Diagnosis not present

## 2021-12-01 DIAGNOSIS — C3411 Malignant neoplasm of upper lobe, right bronchus or lung: Secondary | ICD-10-CM | POA: Diagnosis not present

## 2021-12-01 LAB — RAD ONC ARIA SESSION SUMMARY
Course Elapsed Days: 7
Plan Fractions Treated to Date: 3
Plan Fractions Treated to Date: 6
Plan Prescribed Dose Per Fraction: 18 Gy
Plan Prescribed Dose Per Fraction: 5 Gy
Plan Total Fractions Prescribed: 10
Plan Total Fractions Prescribed: 3
Plan Total Prescribed Dose: 50 Gy
Plan Total Prescribed Dose: 54 Gy
Reference Point Dosage Given to Date: 30 Gy
Reference Point Dosage Given to Date: 54 Gy
Reference Point Session Dosage Given: 18 Gy
Reference Point Session Dosage Given: 5 Gy
Session Number: 6

## 2021-12-01 NOTE — Telephone Encounter (Signed)
Called Ron[facility] regarding the albuterol prescription to verify which pharmacy it needs to be sent to.  Requested a return call.

## 2021-12-01 NOTE — Telephone Encounter (Signed)
Please send if you would like.

## 2021-12-02 ENCOUNTER — Ambulatory Visit
Admission: RE | Admit: 2021-12-02 | Discharge: 2021-12-02 | Disposition: A | Discharge status: Home / Self Care | Payer: Medicare PPO | Source: Ambulatory Visit | Attending: Radiation Oncology | Admitting: Radiation Oncology

## 2021-12-02 ENCOUNTER — Other Ambulatory Visit: Payer: Self-pay

## 2021-12-02 DIAGNOSIS — C3411 Malignant neoplasm of upper lobe, right bronchus or lung: Secondary | ICD-10-CM | POA: Diagnosis not present

## 2021-12-02 DIAGNOSIS — C3432 Malignant neoplasm of lower lobe, left bronchus or lung: Secondary | ICD-10-CM | POA: Diagnosis not present

## 2021-12-02 DIAGNOSIS — Z510 Encounter for antineoplastic radiation therapy: Secondary | ICD-10-CM | POA: Diagnosis not present

## 2021-12-02 DIAGNOSIS — F17210 Nicotine dependence, cigarettes, uncomplicated: Secondary | ICD-10-CM | POA: Diagnosis not present

## 2021-12-02 DIAGNOSIS — C3492 Malignant neoplasm of unspecified part of left bronchus or lung: Secondary | ICD-10-CM | POA: Diagnosis not present

## 2021-12-02 LAB — RAD ONC ARIA SESSION SUMMARY
Course Elapsed Days: 8
Plan Fractions Treated to Date: 7
Plan Prescribed Dose Per Fraction: 5 Gy
Plan Total Fractions Prescribed: 10
Plan Total Prescribed Dose: 50 Gy
Reference Point Dosage Given to Date: 35 Gy
Reference Point Session Dosage Given: 5 Gy
Session Number: 7

## 2021-12-03 ENCOUNTER — Other Ambulatory Visit: Payer: Self-pay

## 2021-12-03 ENCOUNTER — Ambulatory Visit
Admission: RE | Admit: 2021-12-03 | Discharge: 2021-12-03 | Disposition: A | Discharge status: Home / Self Care | Payer: Medicare PPO | Source: Ambulatory Visit | Attending: Radiation Oncology | Admitting: Radiation Oncology

## 2021-12-03 DIAGNOSIS — C3432 Malignant neoplasm of lower lobe, left bronchus or lung: Secondary | ICD-10-CM | POA: Diagnosis not present

## 2021-12-03 DIAGNOSIS — Z510 Encounter for antineoplastic radiation therapy: Secondary | ICD-10-CM | POA: Diagnosis not present

## 2021-12-03 DIAGNOSIS — C3492 Malignant neoplasm of unspecified part of left bronchus or lung: Secondary | ICD-10-CM | POA: Diagnosis not present

## 2021-12-03 DIAGNOSIS — F17210 Nicotine dependence, cigarettes, uncomplicated: Secondary | ICD-10-CM | POA: Diagnosis not present

## 2021-12-03 DIAGNOSIS — C3411 Malignant neoplasm of upper lobe, right bronchus or lung: Secondary | ICD-10-CM | POA: Diagnosis not present

## 2021-12-03 LAB — RAD ONC ARIA SESSION SUMMARY
Course Elapsed Days: 9
Plan Fractions Treated to Date: 8
Plan Prescribed Dose Per Fraction: 5 Gy
Plan Total Fractions Prescribed: 10
Plan Total Prescribed Dose: 50 Gy
Reference Point Dosage Given to Date: 40 Gy
Reference Point Session Dosage Given: 5 Gy
Session Number: 8

## 2021-12-03 MED ORDER — ALBUTEROL SULFATE HFA 108 (90 BASE) MCG/ACT IN AERS: 1.0000 | INHALATION_SPRAY | Freq: Four times a day (QID) | RESPIRATORY_TRACT | 1 refills | Status: DC | PRN

## 2021-12-03 NOTE — Telephone Encounter (Signed)
Called in prescription for the Ventolin inhaler to [place] per patient request.  Also called [facility] and canceled the prescription that was sent there.

## 2021-12-06 ENCOUNTER — Other Ambulatory Visit: Payer: Self-pay

## 2021-12-06 ENCOUNTER — Ambulatory Visit
Admission: RE | Admit: 2021-12-06 | Discharge: 2021-12-06 | Disposition: A | Discharge status: Home / Self Care | Payer: Medicare PPO | Source: Ambulatory Visit | Attending: Radiation Oncology | Admitting: Radiation Oncology

## 2021-12-06 DIAGNOSIS — C3411 Malignant neoplasm of upper lobe, right bronchus or lung: Secondary | ICD-10-CM | POA: Diagnosis not present

## 2021-12-06 DIAGNOSIS — F17210 Nicotine dependence, cigarettes, uncomplicated: Secondary | ICD-10-CM | POA: Diagnosis not present

## 2021-12-06 DIAGNOSIS — C3492 Malignant neoplasm of unspecified part of left bronchus or lung: Secondary | ICD-10-CM | POA: Diagnosis not present

## 2021-12-06 DIAGNOSIS — C3432 Malignant neoplasm of lower lobe, left bronchus or lung: Secondary | ICD-10-CM | POA: Diagnosis not present

## 2021-12-06 DIAGNOSIS — Z510 Encounter for antineoplastic radiation therapy: Secondary | ICD-10-CM | POA: Diagnosis not present

## 2021-12-06 LAB — RAD ONC ARIA SESSION SUMMARY
Course Elapsed Days: 12
Plan Fractions Treated to Date: 9
Plan Prescribed Dose Per Fraction: 5 Gy
Plan Total Fractions Prescribed: 10
Plan Total Prescribed Dose: 50 Gy
Reference Point Dosage Given to Date: 45 Gy
Reference Point Session Dosage Given: 5 Gy
Session Number: 9

## 2021-12-07 ENCOUNTER — Ambulatory Visit
Admission: RE | Admit: 2021-12-07 | Discharge: 2021-12-07 | Disposition: A | Discharge status: Home / Self Care | Payer: Medicare PPO | Source: Ambulatory Visit | Attending: Radiation Oncology | Admitting: Radiation Oncology

## 2021-12-07 ENCOUNTER — Other Ambulatory Visit: Payer: Self-pay

## 2021-12-07 DIAGNOSIS — F17210 Nicotine dependence, cigarettes, uncomplicated: Secondary | ICD-10-CM | POA: Diagnosis not present

## 2021-12-07 DIAGNOSIS — C3492 Malignant neoplasm of unspecified part of left bronchus or lung: Secondary | ICD-10-CM | POA: Diagnosis not present

## 2021-12-07 DIAGNOSIS — C3432 Malignant neoplasm of lower lobe, left bronchus or lung: Secondary | ICD-10-CM | POA: Diagnosis not present

## 2021-12-07 DIAGNOSIS — C3411 Malignant neoplasm of upper lobe, right bronchus or lung: Secondary | ICD-10-CM | POA: Diagnosis not present

## 2021-12-07 DIAGNOSIS — Z510 Encounter for antineoplastic radiation therapy: Secondary | ICD-10-CM | POA: Diagnosis not present

## 2021-12-07 LAB — RAD ONC ARIA SESSION SUMMARY
Course Elapsed Days: 13
Plan Fractions Treated to Date: 10
Plan Prescribed Dose Per Fraction: 5 Gy
Plan Total Fractions Prescribed: 10
Plan Total Prescribed Dose: 50 Gy
Reference Point Dosage Given to Date: 50 Gy
Reference Point Session Dosage Given: 5 Gy
Session Number: 10

## 2021-12-16 ENCOUNTER — Other Ambulatory Visit: Payer: Self-pay | Admitting: Internal Medicine

## 2021-12-16 DIAGNOSIS — E038 Other specified hypothyroidism: Secondary | ICD-10-CM

## 2022-01-03 ENCOUNTER — Telehealth: Payer: Self-pay | Admitting: *Deleted

## 2022-01-03 NOTE — Telephone Encounter (Signed)
CALLED PATIENT TO ASK ABOUT MOVING FU ON 01-13-22 FROM 8:45 AM TO 11:15 AM DUE TO DR. KINARD BEING IN THE OR, LVM FOR A RETURN CALL

## 2022-01-11 ENCOUNTER — Encounter: Payer: Self-pay | Admitting: Radiation Oncology

## 2022-01-11 NOTE — Progress Notes (Incomplete)
Kelly Douglas presents today following completion of radiation treatment for left lung cancer. She completed radiation on 12-01-21.   PAIN: {Pain rating:20411} {PAIN DESCRIPTION:21022940} over {Location on body:14304}.  RESPIRATORY: {CHL RAD ONC LUNG [id number]. Pt is on {Exam; oxygen delivery:30093}. Noted {Exam; skin abnormals:103} {CHL RAD ONC SKIN ROS:11522895}.  SWALLOWING/DIET: Pt {gerd dysphagia:12484} {Desc; oto dysphagia:17829}. {Daily diet habits:20576}.  OTHER: Pt complains of {Blank multiple:19196::"fatigue","weakness","loss of sleep","poor appetite"}.  LMP  (LMP Unknown)    Wt Readings from Last 3 Encounters:  11/04/21 169 lb 6 oz (76.8 kg)  11/01/21 171 lb 1.6 oz (77.6 kg)  10/12/21 167 lb 6.4 oz (75.9 kg)

## 2022-01-12 NOTE — Progress Notes (Incomplete)
  Radiation Oncology         [phone] ________________________________  Patient Name: Kelly Douglas MRN: [id number] DOB: 02/23/1955 Referring Physician: Derek Jack Date of Service: 12/07/2021 [facility], [place]                                                        End Of Treatment Note  Diagnoses: R91.8-Other nonspecific abnormal finding of lung field  Cancer Staging: The encounter diagnosis was Adenocarcinoma of left lung, stage 1 ([place]).   Enlarging right upper lobe pulmonary nodule concerning for malignancy (October 2023). History of adenocarcinoma of the left lower lobe diagnosed in 2021; untreated   Intent: Curative  Radiation Treatment Dates: 11/24/2021 through 12/07/2021 Site Technique Total Dose (Gy) Dose per Fx (Gy) Completed Fx Beam Energies  Lung, Left: Lung_L IMRT 54/54 18 3/3 6XFFF  Lung, Right: Lung_R IMRT 50/50 5 10/10 6XFFF   Narrative: The patient tolerated radiation therapy relatively well. On the date of her final treatment, the patient reported left shoulder pain, fatigue, and productive cough. She denied any other side effects.   Plan: The patient will follow-up with radiation oncology in one month .  ________________________________________________ -----------------------------------  Blair Promise, PhD, MD  This document serves as a record of services personally performed by Gery Pray, MD. It was created on his behalf by Roney Mans, a trained medical scribe. The creation of this record is based on the scribe's personal observations and the provider's statements to them. This document has been checked and approved by the attending provider.

## 2022-01-12 NOTE — Progress Notes (Incomplete)
Radiation Oncology         [phone] ________________________________  Name: Kelly Douglas MRN: [id number]  Date: 01/13/2022  DOB: December 25, 1955  Follow-Up Visit Note  CC: [name], MD  Derek Jack, MD    ICD-10-CM   1. Adenocarcinoma of left lung, stage 1 (HCC)  C34.92       Diagnosis:  The encounter diagnosis was Adenocarcinoma of left lung, stage 1 ([place]).   Enlarging right upper lobe pulmonary nodule concerning for malignancy (October 2023). History of adenocarcinoma of the left lower lobe diagnosed in 2021; untreated   Interval Since Last Radiation: 1 month and 7 days   Intent: Curative  Radiation Treatment Dates: 11/24/2021 through 12/07/2021 Site Technique Total Dose (Gy) Dose per Fx (Gy) Completed Fx Beam Energies  Lung, Left: Lung_L IMRT 54/54 18 3/3 6XFFF  Lung, Right: Lung_R IMRT 50/50 5 10/10 6XFFF   Narrative:  The patient returns today for routine follow-up. The patient tolerated radiation therapy relatively well. On the date of her final treatment, the patient reported left shoulder pain, fatigue, and productive cough. She denied any other side effects.            No significant interval history since the patient completed radiation therapy.   ***                      Allergies:  is allergic to aspirin.  Meds: Current Outpatient Medications  Medication Sig Dispense Refill   acetaminophen (TYLENOL) 650 MG CR tablet Take 1,300 mg by mouth every 8 (eight) hours as needed for pain.      albuterol (VENTOLIN HFA) 108 (90 Base) MCG/ACT inhaler Inhale 1-2 puffs into the lungs every 6 (six) hours as needed for wheezing or shortness of breath. 18 each 1   amLODipine (NORVASC) 5 MG tablet TAKE 1 TABLET EVERY DAY 90 tablet 2   atorvastatin (LIPITOR) 10 MG tablet TAKE 1 TABLET EVERY DAY 90 tablet 1   Cholecalciferol (VITAMIN D) 125 MCG (5000 UT) CAPS Take 5,000 Units by mouth daily.      DULoxetine (CYMBALTA) 60 MG capsule TAKE 1 CAPSULE EVERY DAY  90 capsule 0   famotidine (PEPCID) 20 MG tablet TAKE 1 TABLET EVERY DAY 90 tablet 2   hydrOXYzine (VISTARIL) 50 MG capsule TAKE 1 TO 2 CAPSULES AT BEDTIME FOR SLEEP 180 capsule 10   levothyroxine (SYNTHROID) 75 MCG tablet TAKE 1 TABLET EVERY OTHER DAY ALTERNATING WITH 1/2 TABLET EVERY OTHER DAY 66 tablet 3   magnesium oxide (MAG-OX) 400 (240 Mg) MG tablet TAKE 1 TABLET THREE TIMES DAILY 270 tablet 2   mirtazapine (REMERON) 7.5 MG tablet TAKE 1 TABLET AT BEDTIME 90 tablet 0   montelukast (SINGULAIR) 10 MG tablet TAKE 1 TABLET AT BEDTIME 90 tablet 1   Multiple Vitamin (MULTIVITAMIN WITH MINERALS) TABS tablet Take 1 tablet by mouth daily.     pantoprazole (PROTONIX) 40 MG tablet TAKE 1 TABLET TWICE DAILY 180 tablet 0   No current facility-administered medications for this encounter.    Physical Findings: The patient is in no acute distress. Patient is alert and oriented.  vitals were not taken for this visit. .  No significant changes. Lungs are clear to auscultation bilaterally. Heart has regular rate and rhythm. No palpable cervical, supraclavicular, or axillary adenopathy. Abdomen soft, non-tender, normal bowel sounds.   Lab Findings: Lab Results  Component Value Date   WBC 11.6 (H) 09/10/2021   HGB 13.0 09/10/2021  HCT 40.4 09/10/2021   MCV 77 (L) 09/10/2021   PLT 378 09/10/2021    Radiographic Findings: No results found.  Impression: The encounter diagnosis was Adenocarcinoma of left lung, stage 1 ([place]).   Enlarging right upper lobe pulmonary nodule concerning for malignancy (October 2023). History of adenocarcinoma of the left lower lobe diagnosed in 2021; untreated   The patient is recovering from the effects of radiation.  ***  Plan:  ***   *** minutes of total time was spent for this patient encounter, including preparation, face-to-face counseling with the patient and coordination of care, physical exam, and documentation of the  encounter. ____________________________________  Blair Promise, PhD, MD  This document serves as a record of services personally performed by Gery Pray, MD. It was created on his behalf by Roney Mans, a trained medical scribe. The creation of this record is based on the scribe's personal observations and the provider's statements to them. This document has been checked and approved by the attending provider.

## 2022-01-13 ENCOUNTER — Other Ambulatory Visit: Payer: Self-pay

## 2022-01-13 ENCOUNTER — Ambulatory Visit
Admission: RE | Admit: 2022-01-13 | Discharge: 2022-01-13 | Disposition: A | Discharge status: Home / Self Care | Payer: Medicare PPO | Source: Ambulatory Visit | Attending: Radiation Oncology | Admitting: Radiation Oncology

## 2022-01-13 ENCOUNTER — Telehealth: Payer: Self-pay | Admitting: *Deleted

## 2022-01-13 DIAGNOSIS — C3492 Malignant neoplasm of unspecified part of left bronchus or lung: Secondary | ICD-10-CM

## 2022-01-13 HISTORY — DX: Personal history of irradiation: Z92.3

## 2022-01-13 NOTE — Telephone Encounter (Signed)
Returned patient's phone call, spoke with patient and fu has been rescheduled for 01-20-22 @ 10:45 am

## 2022-01-13 NOTE — Telephone Encounter (Signed)
Returned patient's phone call, and patient has already left to come to [place], notified Dr. Clabe Seal nurse- Edwyna Ready

## 2022-01-18 ENCOUNTER — Emergency Department (HOSPITAL_COMMUNITY)
Admission: EM | Admit: 2022-01-18 | Discharge: 2022-01-19 | Disposition: A | Discharge status: Home / Self Care | Payer: Medicare PPO | Attending: Emergency Medicine | Admitting: Emergency Medicine

## 2022-01-18 ENCOUNTER — Encounter (HOSPITAL_COMMUNITY): Payer: Self-pay | Admitting: *Deleted

## 2022-01-18 ENCOUNTER — Other Ambulatory Visit: Payer: Self-pay

## 2022-01-18 DIAGNOSIS — R112 Nausea with vomiting, unspecified: Secondary | ICD-10-CM | POA: Insufficient documentation

## 2022-01-18 DIAGNOSIS — R059 Cough, unspecified: Secondary | ICD-10-CM | POA: Diagnosis not present

## 2022-01-18 DIAGNOSIS — I10 Essential (primary) hypertension: Secondary | ICD-10-CM | POA: Diagnosis not present

## 2022-01-18 DIAGNOSIS — E039 Hypothyroidism, unspecified: Secondary | ICD-10-CM | POA: Insufficient documentation

## 2022-01-18 DIAGNOSIS — R5381 Other malaise: Secondary | ICD-10-CM | POA: Diagnosis not present

## 2022-01-18 DIAGNOSIS — R051 Acute cough: Secondary | ICD-10-CM | POA: Diagnosis not present

## 2022-01-18 DIAGNOSIS — F17210 Nicotine dependence, cigarettes, uncomplicated: Secondary | ICD-10-CM | POA: Diagnosis not present

## 2022-01-18 DIAGNOSIS — Z85118 Personal history of other malignant neoplasm of bronchus and lung: Secondary | ICD-10-CM | POA: Insufficient documentation

## 2022-01-18 DIAGNOSIS — R000 Tachycardia, unspecified: Secondary | ICD-10-CM | POA: Diagnosis not present

## 2022-01-18 DIAGNOSIS — M791 Myalgia, unspecified site: Secondary | ICD-10-CM | POA: Insufficient documentation

## 2022-01-18 DIAGNOSIS — Z1152 Encounter for screening for COVID-19: Secondary | ICD-10-CM | POA: Insufficient documentation

## 2022-01-18 DIAGNOSIS — R52 Pain, unspecified: Secondary | ICD-10-CM

## 2022-01-18 DIAGNOSIS — M79606 Pain in leg, unspecified: Secondary | ICD-10-CM | POA: Diagnosis not present

## 2022-01-18 LAB — COMPREHENSIVE METABOLIC PANEL
ALT: 26 U/L (ref 0–44)
AST: 35 U/L (ref 15–41)
Albumin: 5 g/dL (ref 3.5–5.0)
Alkaline Phosphatase: 145 U/L — ABNORMAL HIGH (ref 38–126)
Anion gap: 16 — ABNORMAL HIGH (ref 5–15)
BUN: 10 mg/dL (ref 8–23)
CO2: 22 mmol/L (ref 22–32)
Calcium: 10 mg/dL (ref 8.9–10.3)
Chloride: 96 mmol/L — ABNORMAL LOW (ref 98–111)
Creatinine, Ser: 0.97 mg/dL (ref 0.44–1.00)
GFR, Estimated: >60 mL/min (ref >60)
Glucose, Bld: 150 mg/dL — ABNORMAL HIGH (ref 70–99)
Potassium: 3.4 mmol/L — ABNORMAL LOW (ref 3.5–5.1)
Sodium: 134 mmol/L — ABNORMAL LOW (ref 135–145)
Total Bilirubin: 0.7 mg/dL (ref 0.3–1.2)
Total Protein: 9.5 g/dL — ABNORMAL HIGH (ref 6.5–8.1)

## 2022-01-18 LAB — URINALYSIS, ROUTINE W REFLEX MICROSCOPIC
Bilirubin Urine: NEGATIVE
Glucose, UA: NEGATIVE mg/dL
Ketones, ur: 20 mg/dL — AB
Leukocytes,Ua: NEGATIVE
Nitrite: NEGATIVE
Protein, ur: 100 mg/dL — AB
Specific Gravity, Urine: 1.012 (ref 1.005–1.030)
pH: 7 (ref 5.0–8.0)

## 2022-01-18 LAB — MAGNESIUM: Magnesium: 1.8 mg/dL (ref 1.7–2.4)

## 2022-01-18 LAB — LIPASE, BLOOD: Lipase: 62 U/L — ABNORMAL HIGH (ref 11–51)

## 2022-01-18 LAB — CBC
HCT: 42.5 % (ref 36.0–46.0)
Hemoglobin: 13.6 g/dL (ref 12.0–15.0)
MCH: 24.9 pg — ABNORMAL LOW (ref 26.0–34.0)
MCHC: 32 g/dL (ref 30.0–36.0)
MCV: 77.7 fL — ABNORMAL LOW (ref 80.0–100.0)
Platelets: 393 10*3/uL (ref 150–400)
RBC: 5.47 MIL/uL — ABNORMAL HIGH (ref 3.87–5.11)
RDW: 17.9 % — ABNORMAL HIGH (ref 11.5–15.5)
WBC: 10.1 10*3/uL (ref 4.0–10.5)
nRBC: 0.2 % (ref 0.0–0.2)

## 2022-01-18 NOTE — ED Triage Notes (Signed)
Per EMS pt c/o leg cramping, pt believes potassium is low as she reports she had same issue last year

## 2022-01-18 NOTE — ED Triage Notes (Signed)
Pt reports that she had nausea and vomiting today and that she began having cramping.  She believes her magnesium and potassium is low as this is what occurred previously.  Pt denies any nausea now.

## 2022-01-19 ENCOUNTER — Emergency Department (HOSPITAL_COMMUNITY): Payer: Medicare PPO

## 2022-01-19 DIAGNOSIS — R059 Cough, unspecified: Secondary | ICD-10-CM | POA: Diagnosis not present

## 2022-01-19 LAB — RESP PANEL BY RT-PCR (RSV, FLU A&B, COVID)  RVPGX2
Influenza A by PCR: NEGATIVE
Influenza B by PCR: NEGATIVE
Resp Syncytial Virus by PCR: NEGATIVE
SARS Coronavirus 2 by RT PCR: NEGATIVE

## 2022-01-19 MED ORDER — POTASSIUM CHLORIDE CRYS ER 20 MEQ PO TBCR
40.0000 meq | EXTENDED_RELEASE_TABLET | Freq: Once | ORAL | Status: AC
  Administered 2022-01-19: 40 meq via ORAL
  Filled 2022-01-19: qty 2

## 2022-01-19 NOTE — ED Provider Notes (Signed)
[facility] Emergency Department Provider Note MRN:  [id number]  Arrival date & time: 01/19/22     Chief Complaint   Emesis   History of Present Illness   Kelly Douglas is a 67 y.o. year-old female with a history of lung cancer presenting to the ED with chief complaint of emesis.  A few days of cough, also having bodyaches today, few episodes of nausea vomiting today.  No diarrhea, no shortness of breath.  Some soreness in the chest with coughing.  She says the cramping feels like the last time she had low potassium.  Review of Systems  A thorough review of systems was obtained and all systems are negative except as noted in the HPI and PMH.   Patient's Health History    Past Medical History:  Diagnosis Date   Anxiety    Arteriosclerotic cardiovascular disease (ASCVD) 2013   coronary calcification-left main, LAD and CX; small pericardial effusion   Arthritis    hips   Cancer (HCC)    Lung Cancer    Chronic back pain    Chronic bronchitis    Chronic lung disease    Chronic scarring and volume loss-left lung; characteristics of a chronic infectious process-possible MAI   Depression    Dyspnea    occasional    GERD (gastroesophageal reflux disease)    Dr Gala Romney EGD 09/2009->esophagitis, sm HH, antral erosions, atonic esophagus   History of radiation therapy    Left and Right Lung- 11/24/21-12/07/21- Dr. Gery Pray   Hyperlipidemia    Hypertension    Hypothyroid 1981 approx   Scleroderma ([place])    Seasonal allergies    Syncope    Multiple spells over the past 40+ years, likely neurocardiogenic   Tobacco abuse 06/24/2009    Past Surgical History:  Procedure Laterality Date   BILATERAL SALPINGOOPHORECTOMY  2013    Dr. Elonda Husky; uterus remains in situ   BRONCHIAL BIOPSY  11/21/2019   Procedure: BRONCHIAL BIOPSIES;  Surgeon: Collene Gobble, MD;  Location: [facility] ENDOSCOPY;  Service: Pulmonary;;   BRONCHIAL BRUSHINGS  11/21/2019   Procedure: BRONCHIAL  BRUSHINGS;  Surgeon: Collene Gobble, MD;  Location: [facility] ENDOSCOPY;  Service: Pulmonary;;   BRONCHIAL NEEDLE ASPIRATION BIOPSY  11/21/2019   Procedure: BRONCHIAL NEEDLE ASPIRATION BIOPSIES;  Surgeon: Collene Gobble, MD;  Location: [facility] ENDOSCOPY;  Service: Pulmonary;;   BRONCHIAL WASHINGS  11/21/2019   Procedure: BRONCHIAL WASHINGS;  Surgeon: Collene Gobble, MD;  Location: [facility] ENDOSCOPY;  Service: Pulmonary;;   COLONOSCOPY  09/28/09   anal papilla otherwise normal   COLONOSCOPY N/A 08/29/2012   HBZ:JIRCVEL polyp-removed as described above. tubular adenoma   COLONOSCOPY WITH PROPOFOL N/A 11/11/2019   Procedure: COLONOSCOPY WITH PROPOFOL;  Surgeon: Daneil Dolin, MD;  Location: [facility] ENDO SUITE;  Service: Endoscopy;  Laterality: N/A;  2:30pm   ESOPHAGOGASTRODUODENOSCOPY  10/07/09   Dr. Barrie Dunker of esophageal mucosa, diffusely ?esophagitis (bx benign), small HH/antral erosions, erythema bx benign. 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 Hiatal hernia   FUDUCIAL PLACEMENT  10/18/2017   Procedure: PLACEMENT OF 3 FUDUCIAL INTO LEFT LOWER LOBE OF LUNG-TARGET 1;  Surgeon: Collene Gobble, MD;  Location: [place];  Service: Thoracic;;   LAPAROSCOPIC APPENDECTOMY  07/16/2011   Procedure: APPENDECTOMY LAPAROSCOPIC;  Surgeon: Donato Heinz, MD;  Location: [facility] ORS;  Service: General;  Laterality: N/A;   POLYPECTOMY  11/11/2019   Procedure: POLYPECTOMY;  Surgeon: Daneil Dolin, MD;  Location: [facility] ENDO SUITE;  Service: Endoscopy;;   TUBAL LIGATION     VIDEO BRONCHOSCOPY  10/18/2017   VIDEO BRONCHOSCOPY WITH ENDOBRONCHIAL NAVIGATION N/A 10/18/2017   Procedure: VIDEO BRONCHOSCOPY WITH ENDOBRONCHIAL NAVIGATION;  Surgeon: Collene Gobble, MD;  Location: [facility] OR;  Service: Thoracic;  Laterality: N/A;   VIDEO BRONCHOSCOPY WITH ENDOBRONCHIAL  NAVIGATION Left 11/21/2019   Procedure: VIDEO BRONCHOSCOPY WITH ENDOBRONCHIAL NAVIGATION;  Surgeon: Collene Gobble, MD;  Location: [facility] ENDOSCOPY;  Service: Pulmonary;  Laterality: Left;    Family History  Problem Relation Age of Onset   Aneurysm Mother    Stroke Father    Aneurysm Father    Hypertension Sister    Cancer Brother        throat cancer   Coronary artery disease Brother    Hypertension Brother    Down syndrome Brother    Hypertension Brother    Hypertension Brother        father/mother   Throat cancer Brother 44       throat cancer   Diabetes Brother    Diabetes Brother    Anesthesia problems Neg Hx    Hypotension Neg Hx    Malignant hyperthermia Neg Hx    Pseudochol deficiency Neg Hx    Colon cancer Neg Hx     Social History   Socioeconomic History   Marital status: Married    Spouse name: Not on file   Number of children: 2   Years of education: Not on file   Highest education level: Not on file  Occupational History   Occupation: disabled    Employer: NOT EMPLOYED  Tobacco Use   Smoking status: Every Day    Packs/day: 0.50    Years: 45.00    Total pack years: 22.50    Types: Cigarettes    Start date: 07/14/1971   Smokeless tobacco: Never   Tobacco comments:    0.5 packs of cigarettes smoked daily ARJ 12/31/19  Vaping Use   Vaping Use: Never used  Substance and Sexual Activity   Alcohol use: Not Currently    Alcohol/week: 0.0 standard drinks of alcohol    Comment: quit June 2012-used to drink 6 pack daily, occasional beer per pt 11/20/19   Drug use: No   Sexual activity: Yes    Birth control/protection: Post-menopausal  Other Topics Concern   Not on file  Social History Narrative   Not on file   Social Determinants of Health   Financial Resource Strain: Low Risk  (11/25/2020)   Overall Financial Resource Strain (CARDIA)    Difficulty of Paying Living Expenses: Not hard at all  Food Insecurity: No Food Insecurity (11/25/2020)   Hunger Vital  Sign    Worried About Running Out of Food in the Last Year: Never true    [place] in the Last Year: Never true  Transportation Needs: No Transportation Needs (11/25/2020)   PRAPARE - Hydrologist (Medical): No    Lack of Transportation (Non-Medical): No  Physical Activity: Insufficiently Active (11/25/2020)   Exercise Vital Sign    Days of Exercise per Week: 3 days    Minutes of Exercise per Session: 30 min  Stress: No Stress Concern Present (11/25/2020)   [place]    Feeling of Stress : Not at all  Social Connections: Moderately Integrated (11/25/2020)   Social  Connection and Isolation Panel [NHANES]    Frequency of Communication with Friends and Family: Three times a week    Frequency of Social Gatherings with Friends and Family: Once a week    Attends Religious Services: More than 4 times per year    Active Member of [employer] or Organizations: No    Attends Archivist Meetings: Never    Marital Status: Married  Human resources officer Violence: Not At Risk (11/25/2020)   Humiliation, Afraid, Rape, and Kick questionnaire    Fear of Current or Ex-Partner: No    Emotionally Abused: No    Physically Abused: No    Sexually Abused: No     Physical Exam   Vitals:   01/19/22 0226 01/19/22 0230  BP: (!) 142/81   Pulse: 87   Resp: 19   Temp:  98.8 F (37.1 C)  SpO2: 100%     CONSTITUTIONAL: Well-appearing, NAD NEURO/PSYCH:  Alert and oriented x 3, no focal deficits EYES:  eyes equal and reactive ENT/NECK:  no LAD, no JVD CARDIO: Regular rate, well-perfused, normal S1 and S2 PULM:  CTAB no wheezing or rhonchi GI/GU:  non-distended, non-tender MSK/SPINE:  No gross deformities, no edema SKIN:  no rash, atraumatic   *Additional and/or pertinent findings included in MDM below  Diagnostic and Interventional Summary    EKG Interpretation  Date/Time:  Tuesday January 18 2022  21:37:07 EST Ventricular Rate:  111 PR Interval:  146 QRS Duration: 78 QT Interval:  342 QTC Calculation: 465 R Axis:   41 Text Interpretation: Sinus tachycardia Cannot rule out Anterior infarct , age undetermined Abnormal ECG When compared with ECG of 07-May-2020 12:43, PREVIOUS ECG IS PRESENT Confirmed by Gerlene Fee [phone]) on 01/19/2022 1:21:02 AM       Labs Reviewed  LIPASE, BLOOD - Abnormal; Notable for the following components:      Result Value   Lipase 62 (*)    All other components within normal limits  COMPREHENSIVE METABOLIC PANEL - Abnormal; Notable for the following components:   Sodium 134 (*)    Potassium 3.4 (*)    Chloride 96 (*)    Glucose, Bld 150 (*)    Total Protein 9.5 (*)    Alkaline Phosphatase 145 (*)    Anion gap 16 (*)    All other components within normal limits  CBC - Abnormal; Notable for the following components:   RBC 5.47 (*)    MCV 77.7 (*)    MCH 24.9 (*)    RDW 17.9 (*)    All other components within normal limits  URINALYSIS, ROUTINE W REFLEX MICROSCOPIC - Abnormal; Notable for the following components:   Hgb urine dipstick SMALL (*)    Ketones, ur 20 (*)    Protein, ur 100 (*)    Bacteria, UA RARE (*)    All other components within normal limits  RESP PANEL BY RT-PCR (RSV, FLU A&B, COVID)  RVPGX2  MAGNESIUM    DG Chest 2 View  Final Result      Medications  potassium chloride SA (KLOR-CON M) CR tablet 40 mEq (has no administration in time range)     Procedures  /  Critical Care Procedures  ED Course and Medical Decision Making  Initial Impression and Ddx Differential diagnosis includes viral illness, pneumonia, pneumothorax, hypokalemia  Past medical/surgical history that increases complexity of ED encounter: Lung cancer  Interpretation of Diagnostics I personally reviewed the Chest Xray and my interpretation is as follows: No lobar opacity  or pneumothorax  Labs reveal minimal hypokalemia, largely no significant  electrolyte or blood count disturbance  Patient Reassessment and Ultimate Disposition/Management     Patient is appropriate for discharge with reassurance.  Patient management required discussion with the following services or consulting groups:  None  Complexity of Problems Addressed Acute complicated illness or Injury  Additional Data Reviewed and Analyzed Further history obtained from: None  Additional Factors Impacting ED Encounter Risk None  Barth Kirks. Sedonia Small, MD [place] [email or web address]  Final Clinical Impressions(s) / ED Diagnoses     ICD-10-CM   1. Acute cough  R05.1     2. Body aches  R52       ED Discharge Orders     None        Discharge Instructions Discussed with and Provided to Patient:     Discharge Instructions      You were evaluated in the Emergency Department and after careful evaluation, we did not find any emergent condition requiring admission or further testing in the hospital.  Your exam/testing today was overall reassuring.  Symptoms may be due to low potassium or a viral illness.  Recommend following up on your COVID and flu testing on MyChart.  Increase the potassium in your diet.  Please return to the Emergency Department if you experience any worsening of your condition.  Thank you for allowing [place] to be a part of your care.        Maudie Flakes, MD 01/19/22 [phone]

## 2022-01-19 NOTE — Discharge Instructions (Signed)
You were evaluated in the Emergency Department and after careful evaluation, we did not find any emergent condition requiring admission or further testing in the hospital.  Your exam/testing today was overall reassuring.  Symptoms may be due to low potassium or a viral illness.  Recommend following up on your COVID and flu testing on MyChart.  Increase the potassium in your diet.  Please return to the Emergency Department if you experience any worsening of your condition.  Thank you for allowing [place] to be a part of your care.

## 2022-01-19 NOTE — Progress Notes (Incomplete)
Radiation Oncology         [phone] ________________________________  Name: Kelly Douglas MRN: [id number]  Date: 01/20/2022  DOB: [birth year]  Follow-Up Visit Note  CC: [name], MD  Derek Jack, MD  No diagnosis found.   Diagnosis:  The encounter diagnosis was Adenocarcinoma of left lung, stage 1 ([place]).   Enlarging right upper lobe pulmonary nodule concerning for malignancy (October 2023). History of adenocarcinoma of the left lower lobe diagnosed in 2021; untreated   Interval Since Last Radiation: 1 month and 14 days   Intent: Curative  Radiation Treatment Dates: 11/24/2021 through 12/07/2021 Site Technique Total Dose (Gy) Dose per Fx (Gy) Completed Fx Beam Energies  Lung, Left: Lung_L IMRT 54/54 18 3/3 6XFFF  Lung, Right: Lung_R IMRT 50/50 5 10/10 6XFFF   Narrative:  The patient returns today for routine follow-up. The patient tolerated radiation therapy relatively well. On the date of her final treatment, the patient reported left shoulder pain, fatigue, and productive cough. She denied any other side effects.            On 01/18/22, the patient presented to the ED for evaluation of several days of cough, chest soreness, and body aches. She also reported several episodes of emesis occurring that day prior to presentation. Chest x-ray performed was unremarkable. Labs showed very mild hypokalemia, and the patient was advised to increase her intake of potassium rich foods.   Otherwise, no significant interval history since the patient completed radiation.   ***                      Allergies:  is allergic to aspirin.  Meds: Current Outpatient Medications  Medication Sig Dispense Refill   acetaminophen (TYLENOL) 650 MG CR tablet Take 1,300 mg by mouth every 8 (eight) hours as needed for pain.      albuterol (VENTOLIN HFA) 108 (90 Base) MCG/ACT inhaler Inhale 1-2 puffs into the lungs every 6 (six) hours as needed for wheezing or shortness of breath. 18  each 1   amLODipine (NORVASC) 5 MG tablet TAKE 1 TABLET EVERY DAY 90 tablet 2   atorvastatin (LIPITOR) 10 MG tablet TAKE 1 TABLET EVERY DAY 90 tablet 1   Cholecalciferol (VITAMIN D) 125 MCG (5000 UT) CAPS Take 5,000 Units by mouth daily.      DULoxetine (CYMBALTA) 60 MG capsule TAKE 1 CAPSULE EVERY DAY 90 capsule 0   famotidine (PEPCID) 20 MG tablet TAKE 1 TABLET EVERY DAY 90 tablet 2   hydrOXYzine (VISTARIL) 50 MG capsule TAKE 1 TO 2 CAPSULES AT BEDTIME FOR SLEEP 180 capsule 10   levothyroxine (SYNTHROID) 75 MCG tablet TAKE 1 TABLET EVERY OTHER DAY ALTERNATING WITH 1/2 TABLET EVERY OTHER DAY 66 tablet 3   magnesium oxide (MAG-OX) 400 (240 Mg) MG tablet TAKE 1 TABLET THREE TIMES DAILY 270 tablet 2   mirtazapine (REMERON) 7.5 MG tablet TAKE 1 TABLET AT BEDTIME 90 tablet 0   montelukast (SINGULAIR) 10 MG tablet TAKE 1 TABLET AT BEDTIME 90 tablet 1   Multiple Vitamin (MULTIVITAMIN WITH MINERALS) TABS tablet Take 1 tablet by mouth daily.     pantoprazole (PROTONIX) 40 MG tablet TAKE 1 TABLET TWICE DAILY 180 tablet 0   No current facility-administered medications for this encounter.    Physical Findings: The patient is in no acute distress. Patient is alert and oriented.  vitals were not taken for this visit. .  No significant changes. Lungs are clear to auscultation bilaterally.  Heart has regular rate and rhythm. No palpable cervical, supraclavicular, or axillary adenopathy. Abdomen soft, non-tender, normal bowel sounds.   Lab Findings: Lab Results  Component Value Date   WBC 10.1 01/18/2022   HGB 13.6 01/18/2022   HCT 42.5 01/18/2022   MCV 77.7 (L) 01/18/2022   PLT 393 01/18/2022    Radiographic Findings: DG Chest 2 View  Result Date: 01/19/2022 CLINICAL DATA:  Cough and history of lung carcinoma EXAM: CHEST - 2 VIEW COMPARISON:  09/29/2021 FINDINGS: The cardiac shadow is stable. Persistent left basilar nodule is noted similar to that seen on recent CT examination. Fiducial markers are  noted and stable. Scarring is noted in the left apex also stable from prior CT. No sizable effusion is seen. No acute infiltrate is noted. No bony abnormality is noted. IMPRESSION: Stable left basilar nodular density consistent with the given clinical history. No acute abnormality noted. Electronically Signed   By: Inez Catalina M.D.   On: 01/19/2022 03:01    Impression: The encounter diagnosis was Adenocarcinoma of left lung, stage 1 ([place]).   Enlarging right upper lobe pulmonary nodule concerning for malignancy (October 2023). History of adenocarcinoma of the left lower lobe diagnosed in 2021; untreated   The patient is recovering from the effects of radiation.  ***  Plan:  ***   *** minutes of total time was spent for this patient encounter, including preparation, face-to-face counseling with the patient and coordination of care, physical exam, and documentation of the encounter. ____________________________________  Blair Promise, PhD, MD  This document serves as a record of services personally performed by Gery Pray, MD. It was created on his behalf by Roney Mans, a trained medical scribe. The creation of this record is based on the scribe's personal observations and the provider's statements to them. This document has been checked and approved by the attending provider.

## 2022-01-20 ENCOUNTER — Ambulatory Visit
Admission: RE | Admit: 2022-01-20 | Discharge: 2022-01-20 | Disposition: A | Discharge status: Home / Self Care | Payer: Medicare PPO | Source: Ambulatory Visit | Attending: Radiation Oncology | Admitting: Radiation Oncology

## 2022-01-24 ENCOUNTER — Telehealth: Payer: Self-pay

## 2022-01-24 NOTE — Telephone Encounter (Signed)
        Patient  visited [facility] on 01/19/2022  for acute cough, body aches.   Telephone encounter attempt :  1st  Unable to leave message no voicemail.   [place] Resource Care Guide   [email or web address]  ?? [phone]   Website: [email or web address]  [email or web address]

## 2022-01-25 ENCOUNTER — Telehealth: Payer: Self-pay

## 2022-01-25 NOTE — Telephone Encounter (Signed)
        Patient  visited [facility] on 01/19/2022  for acute cough, body aches.   Telephone encounter attempt :  2nd  A HIPAA compliant voice message was left requesting a return call.  Instructed patient to call back at [phone].   [place] Resource Care Guide   [email or web address]  ?? [phone]   Website: [email or web address]  [email or web address]

## 2022-01-26 NOTE — Progress Notes (Signed)
Radiation Oncology         [phone] ________________________________  Name: Kelly Douglas MRN: [id number]  Date: 01/27/2022  DOB: 1955-04-29  Follow-Up Visit Note  CC: Kelly Helper, MD  Kelly Jack, MD  No diagnosis found.   Diagnosis:  The encounter diagnosis was Adenocarcinoma of left lung, stage 1 ([place]).   Enlarging right upper lobe pulmonary nodule concerning for malignancy (October 2023). History of adenocarcinoma of the left lower lobe diagnosed in 2021; untreated   Interval Since Last Radiation: 1 month and 21 days   Intent: Curative  Radiation Treatment Dates: 11/24/2021 through 12/07/2021 Site Technique Total Dose (Gy) Dose per Fx (Gy) Completed Fx Beam Energies  Lung, Left: Lung_L IMRT 54/54 18 3/3 6XFFF  Lung, Right: Lung_R IMRT 50/50 5 10/10 6XFFF   Narrative:  The patient returns today for routine follow-up. The patient tolerated radiation therapy relatively well. On the date of her final treatment, the patient reported left shoulder pain, fatigue, and productive cough. She denied any other side effects.            On 01/18/22, the patient presented to the ED for evaluation of several days of cough, chest soreness, and body aches. She also reported several episodes of emesis occurring that day prior to presentation. Chest x-ray performed was unremarkable. Labs showed very mild hypokalemia, and the patient was advised to increase her intake of potassium rich foods.   Otherwise, no significant interval history since the patient completed radiation.   ***                      Allergies:  is allergic to aspirin.  Meds: Current Outpatient Medications  Medication Sig Dispense Refill   acetaminophen (TYLENOL) 650 MG CR tablet Take 1,300 mg by mouth every 8 (eight) hours as needed for pain.      albuterol (VENTOLIN HFA) 108 (90 Base) MCG/ACT inhaler Inhale 1-2 puffs into the lungs every 6 (six) hours as needed for wheezing or shortness of breath.  18 each 1   amLODipine (NORVASC) 5 MG tablet TAKE 1 TABLET EVERY DAY 90 tablet 2   atorvastatin (LIPITOR) 10 MG tablet TAKE 1 TABLET EVERY DAY 90 tablet 1   Cholecalciferol (VITAMIN D) 125 MCG (5000 UT) CAPS Take 5,000 Units by mouth daily.      DULoxetine (CYMBALTA) 60 MG capsule TAKE 1 CAPSULE EVERY DAY 90 capsule 0   famotidine (PEPCID) 20 MG tablet TAKE 1 TABLET EVERY DAY 90 tablet 2   hydrOXYzine (VISTARIL) 50 MG capsule TAKE 1 TO 2 CAPSULES AT BEDTIME FOR SLEEP 180 capsule 10   levothyroxine (SYNTHROID) 75 MCG tablet TAKE 1 TABLET EVERY OTHER DAY ALTERNATING WITH 1/2 TABLET EVERY OTHER DAY 66 tablet 3   magnesium oxide (MAG-OX) 400 (240 Mg) MG tablet TAKE 1 TABLET THREE TIMES DAILY 270 tablet 2   mirtazapine (REMERON) 7.5 MG tablet TAKE 1 TABLET AT BEDTIME 90 tablet 0   montelukast (SINGULAIR) 10 MG tablet TAKE 1 TABLET AT BEDTIME 90 tablet 1   Multiple Vitamin (MULTIVITAMIN WITH MINERALS) TABS tablet Take 1 tablet by mouth daily.     pantoprazole (PROTONIX) 40 MG tablet TAKE 1 TABLET TWICE DAILY 180 tablet 0   No current facility-administered medications for this encounter.    Physical Findings: The patient is in no acute distress. Patient is alert and oriented.  vitals were not taken for this visit. .  No significant changes. Lungs are clear to auscultation bilaterally.  Heart has regular rate and rhythm. No palpable cervical, supraclavicular, or axillary adenopathy. Abdomen soft, non-tender, normal bowel sounds.   Lab Findings: Lab Results  Component Value Date   WBC 10.1 01/18/2022   HGB 13.6 01/18/2022   HCT 42.5 01/18/2022   MCV 77.7 (L) 01/18/2022   PLT 393 01/18/2022    Radiographic Findings: DG Chest 2 View  Result Date: 01/19/2022 CLINICAL DATA:  Cough and history of lung carcinoma EXAM: CHEST - 2 VIEW COMPARISON:  09/29/2021 FINDINGS: The cardiac shadow is stable. Persistent left basilar nodule is noted similar to that seen on recent CT examination. Fiducial markers  are noted and stable. Scarring is noted in the left apex also stable from prior CT. No sizable effusion is seen. No acute infiltrate is noted. No bony abnormality is noted. IMPRESSION: Stable left basilar nodular density consistent with the given clinical history. No acute abnormality noted. Electronically Signed   By: Inez Catalina M.D.   On: 01/19/2022 03:01    Impression: The encounter diagnosis was Adenocarcinoma of left lung, stage 1 ([place]).   Enlarging right upper lobe pulmonary nodule concerning for malignancy (October 2023). History of adenocarcinoma of the left lower lobe diagnosed in 2021; untreated   The patient is recovering from the effects of radiation.  ***  Plan:  ***   *** minutes of total time was spent for this patient encounter, including preparation, face-to-face counseling with the patient and coordination of care, physical exam, and documentation of the encounter. ____________________________________  Blair Promise, PhD, MD  This document serves as a record of services personally performed by Gery Pray, MD. It was created on his behalf by Roney Mans, a trained medical scribe. The creation of this record is based on the scribe's personal observations and the provider's statements to them. This document has been checked and approved by the attending provider.

## 2022-01-27 ENCOUNTER — Encounter: Payer: Self-pay | Admitting: Radiation Oncology

## 2022-01-27 ENCOUNTER — Ambulatory Visit
Admission: RE | Admit: 2022-01-27 | Discharge: 2022-01-27 | Disposition: A | Discharge status: Home / Self Care | Payer: Medicare HMO | Source: Ambulatory Visit | Attending: Radiation Oncology | Admitting: Radiation Oncology

## 2022-01-27 ENCOUNTER — Other Ambulatory Visit: Payer: Self-pay

## 2022-01-27 DIAGNOSIS — R918 Other nonspecific abnormal finding of lung field: Secondary | ICD-10-CM | POA: Diagnosis not present

## 2022-01-27 DIAGNOSIS — Z79899 Other long term (current) drug therapy: Secondary | ICD-10-CM | POA: Diagnosis not present

## 2022-01-27 DIAGNOSIS — C3492 Malignant neoplasm of unspecified part of left bronchus or lung: Secondary | ICD-10-CM

## 2022-01-27 DIAGNOSIS — E876 Hypokalemia: Secondary | ICD-10-CM | POA: Diagnosis not present

## 2022-01-27 DIAGNOSIS — Z85118 Personal history of other malignant neoplasm of bronchus and lung: Secondary | ICD-10-CM | POA: Insufficient documentation

## 2022-01-27 DIAGNOSIS — Z923 Personal history of irradiation: Secondary | ICD-10-CM | POA: Diagnosis not present

## 2022-01-27 DIAGNOSIS — Z79890 Hormone replacement therapy (postmenopausal): Secondary | ICD-10-CM | POA: Diagnosis not present

## 2022-01-27 NOTE — Progress Notes (Signed)
Kelly Douglas is here today for follow up post radiation to the lung.  Lung Side:  Left and right, patient completed treatment on 12/07/21.  Does the patient complain of any of the following: Pain: No  Shortness of breath w/wo exertion: Yes, mostly on exertion. Cough: Yes, productive Hemoptysis: No Pain with swallowing: No Swallowing/choking concerns: No Appetite: Fair,  Weight:  Wt Readings from Last 3 Encounters:  01/27/22 173 lb 12.8 oz (78.8 kg)  11/04/21 169 lb 6 oz (76.8 kg)  11/01/21 171 lb 1.6 oz (77.6 kg)    Energy Level: Fair Post radiation skin Changes: No    Additional comments if applicable:   BP 209/19 (BP Location: Right Arm, Patient Position: Sitting, Cuff Size: Normal)   Pulse 92   Temp 97.9 F (36.6 C)   Resp 20   Ht 5\' 9"  (1.753 m)   Wt 173 lb 12.8 oz (78.8 kg)   LMP  (LMP Unknown)   SpO2 98%   BMI 25.67 kg/m

## 2022-02-02 ENCOUNTER — Telehealth: Payer: Self-pay

## 2022-02-02 NOTE — Telephone Encounter (Signed)
        Patient  visited [facility] on 01/19/2022  for acute cough, body aches.   Telephone encounter attempt :  3rd  Unable to leave message voicemail is full.   Catriona Dillenbeck [facility]  [facility] Population Health Community Resource Care Guide   [email or web address]  ?? [phone]   Website: [email or web address]  [email or web address]

## 2022-02-03 ENCOUNTER — Encounter: Payer: Self-pay | Admitting: Family Medicine

## 2022-02-03 ENCOUNTER — Ambulatory Visit (INDEPENDENT_AMBULATORY_CARE_PROVIDER_SITE_OTHER): Payer: Medicare HMO | Admitting: Family Medicine

## 2022-02-03 VITALS — BP 134/84 | HR 79 | Ht 68.0 in | Wt 171.1 lb

## 2022-02-03 DIAGNOSIS — R195 Other fecal abnormalities: Secondary | ICD-10-CM

## 2022-02-03 DIAGNOSIS — K219 Gastro-esophageal reflux disease without esophagitis: Secondary | ICD-10-CM | POA: Diagnosis not present

## 2022-02-03 DIAGNOSIS — E782 Mixed hyperlipidemia: Secondary | ICD-10-CM

## 2022-02-03 DIAGNOSIS — E038 Other specified hypothyroidism: Secondary | ICD-10-CM

## 2022-02-03 DIAGNOSIS — E876 Hypokalemia: Secondary | ICD-10-CM | POA: Diagnosis not present

## 2022-02-03 DIAGNOSIS — M542 Cervicalgia: Secondary | ICD-10-CM

## 2022-02-03 DIAGNOSIS — F17218 Nicotine dependence, cigarettes, with other nicotine-induced disorders: Secondary | ICD-10-CM

## 2022-02-03 DIAGNOSIS — I10 Essential (primary) hypertension: Secondary | ICD-10-CM | POA: Diagnosis not present

## 2022-02-03 DIAGNOSIS — D126 Benign neoplasm of colon, unspecified: Secondary | ICD-10-CM

## 2022-02-03 NOTE — Patient Instructions (Addendum)
Follow-up in 4 months, call if you need me sooner.  Please bring all medications to next visit  Congrats on reducing smoking down to approximately 1/ 2 a day you should decide to stop smoking entirely.  Labs today lipid CMP and EGFR TSH.  I am referring you to Dr. Geoffery Spruce due to your 1 year history of change in stool habits.  Call [phone] QUITNOW for help with quitting smoking Thanks for choosing [facility], we consider it a privelige to serve you.

## 2022-02-04 ENCOUNTER — Encounter: Payer: Self-pay | Admitting: Gastroenterology

## 2022-02-04 LAB — CMP14+EGFR
ALT: 25 IU/L (ref 0–32)
AST: 27 IU/L (ref 0–40)
Albumin/Globulin Ratio: 1.4 (ref 1.2–2.2)
Albumin: 4.2 g/dL (ref 3.9–4.9)
Alkaline Phosphatase: 125 IU/L — ABNORMAL HIGH (ref 44–121)
BUN/Creatinine Ratio: 10 — ABNORMAL LOW (ref 12–28)
BUN: 11 mg/dL (ref 8–27)
Bilirubin Total: 0.4 mg/dL (ref 0.0–1.2)
CO2: 22 mmol/L (ref 20–29)
Calcium: 10.2 mg/dL (ref 8.7–10.3)
Chloride: 102 mmol/L (ref 96–106)
Creatinine, Ser: 1.07 mg/dL — ABNORMAL HIGH (ref 0.57–1.00)
Globulin, Total: 3.1 g/dL (ref 1.5–4.5)
Glucose: 117 mg/dL — ABNORMAL HIGH (ref 70–99)
Potassium: 4.2 mmol/L (ref 3.5–5.2)
Sodium: 140 mmol/L (ref 134–144)
Total Protein: 7.3 g/dL (ref 6.0–8.5)
eGFR: 57 mL/min/{1.73_m2} — ABNORMAL LOW (ref >59)

## 2022-02-04 LAB — LIPID PANEL
Chol/HDL Ratio: 3.5 ratio (ref 0.0–4.4)
Cholesterol, Total: 140 mg/dL (ref 100–199)
HDL: 40 mg/dL (ref >39)
LDL Chol Calc (NIH): 72 mg/dL (ref 0–99)
Triglycerides: 163 mg/dL — ABNORMAL HIGH (ref 0–149)
VLDL Cholesterol Cal: 28 mg/dL (ref 5–40)

## 2022-02-04 LAB — TSH: TSH: 1.85 u[IU]/mL (ref 0.450–4.500)

## 2022-02-07 ENCOUNTER — Encounter: Payer: Self-pay | Admitting: Family Medicine

## 2022-02-07 DIAGNOSIS — R195 Other fecal abnormalities: Secondary | ICD-10-CM | POA: Insufficient documentation

## 2022-02-07 NOTE — Assessment & Plan Note (Signed)
1 year h/o change in stool habits, ho tubular adenoma and ongoing nicotine, being treated for lung cancer, refer GI

## 2022-02-07 NOTE — Progress Notes (Signed)
Kelly Douglas     MRN: [id number]      DOB: 1955/10/27   HPI Kelly Douglas is here for follow up and re-evaluation of chronic medical conditions, medication management and review of any available recent lab and radiology data.  Preventive health is updated, specifically  Cancer screening and Immunization.   Questions or concerns regarding consultations or procedures which the PT has had in the interim are  addressed. The PT denies any adverse reactions to current medications since the last visit.  C/o change in bowel habits in past 1 year  ROS Denies recent fever or chills. Denies sinus pressure, nasal congestion, ear pain or sore throat. Denies chest congestion, productive cough or wheezing. Denies chest pains, palpitations and leg swelling .   Denies dysuria, frequency, hesitancy or incontinence. Denies joint pain, swelling and limitation in mobility. Denies headaches, seizures, numbness, or tingling. Denies depression, anxiety or insomnia. Denies skin break down or rash.   PE  BP 134/84   Pulse 79   Ht 5\' 8"  (1.727 m)   Wt 171 lb 1.9 oz (77.6 kg)   LMP  (LMP Unknown)   SpO2 91%   BMI 26.02 kg/m   Patient alert and oriented and in no cardiopulmonary distress.  HEENT: No facial asymmetry, EOMI,     Neck supple .  Chest: Clear to auscultation bilaterally.Decreased air entry   CVS: S1, S2 no murmurs, no S3.Regular rate.  ABD: Soft non tender.   Ext: No edema  MS: Adequate though reduced ROM spine, shoulders, hips and knees.  Skin: Intact, no ulcerations or rash noted.  Psych: Good eye contact, normal affect. Memory intact not anxious or depressed appearing.  CNS: CN 2-12 intact, power,  normal throughout.no focal deficits noted.   Assessment & Plan  HTN (hypertension) DASH diet and commitment to daily physical activity for a minimum of 30 minutes discussed and encouraged, as a part of hypertension management. The importance of attaining a healthy weight  is also discussed.     02/03/2022    1:11 PM 01/27/2022    4:01 PM 01/19/2022    3:35 AM 01/19/2022    2:26 AM 01/19/2022    1:14 AM 01/18/2022    9:35 PM 11/04/2021    2:44 PM  BP/Weight  Systolic BP 134 137 149 142 155 130 148  Diastolic BP 84 71 87 81 94 83 69  Wt. (Lbs) 171.12 173.8     169.38  BMI 26.02 kg/m2 25.67 kg/m2     25.01 kg/m2     No med change, needs to reduce salt in diet and stop smoking  Hypothyroidism Controlled, no change in medication   Nicotine dependence Asked:confirms currently smokes cigarettes 1 to 2/ day Assess: Unwilling to set a quit date, but is cutting back Advise: needs to QUIT to reduce risk of cancer, cardio and cerebrovascular disease Assist: counseled for 5 minutes and literature provided Arrange: follow up in 2 to 4 months   Hyperlipidemia Hyperlipidemia:Low fat diet discussed and encouraged.   Lipid Panel  Lab Results  Component Value Date   CHOL 140 02/03/2022   HDL 40 02/03/2022   LDLCALC 72 02/03/2022   TRIG 163 (H) 02/03/2022   CHOLHDL 3.5 02/03/2022     No med change , needs to educe dietary fat  Cervical pain (neck) 1 year h/o change in stool habits, ho tubular adenoma and ongoing nicotine, being treated for lung cancer, refer GI  GERD (gastroesophageal reflux disease) Controlled, no change  in medication

## 2022-02-07 NOTE — Assessment & Plan Note (Signed)
Hyperlipidemia:Low fat diet discussed and encouraged.   Lipid Panel  Lab Results  Component Value Date   CHOL 140 02/03/2022   HDL 40 02/03/2022   LDLCALC 72 02/03/2022   TRIG 163 (H) 02/03/2022   CHOLHDL 3.5 02/03/2022     No med change , needs to educe dietary fat

## 2022-02-07 NOTE — Assessment & Plan Note (Signed)
Controlled, no change in medication  

## 2022-02-07 NOTE — Assessment & Plan Note (Signed)
DASH diet and commitment to daily physical activity for a minimum of 30 minutes discussed and encouraged, as a part of hypertension management. The importance of attaining a healthy weight is also discussed.     02/03/2022    1:11 PM 01/27/2022    4:01 PM 01/19/2022    3:35 AM 01/19/2022    2:26 AM 01/19/2022    1:14 AM 01/18/2022    9:35 PM 11/04/2021    2:44 PM  BP/Weight  Systolic BP 134 137 149 142 155 130 148  Diastolic BP 84 71 87 81 94 83 69  Wt. (Lbs) 171.12 173.8     169.38  BMI 26.02 kg/m2 25.67 kg/m2     25.01 kg/m2     No med change, needs to reduce salt in diet and stop smoking

## 2022-02-07 NOTE — Assessment & Plan Note (Signed)
Asked:confirms currently smokes cigarettes 1 to 2/ day Assess: Unwilling to set a quit date, but is cutting back Advise: needs to QUIT to reduce risk of cancer, cardio and cerebrovascular disease Assist: counseled for 5 minutes and literature provided Arrange: follow up in 2 to 4 months

## 2022-02-09 ENCOUNTER — Telehealth: Payer: Self-pay

## 2022-02-09 NOTE — Telephone Encounter (Signed)
Patient agrees to see Dr. Delton Coombes for all follow up appointments in the future. Patient verbalized appointments for CT scan on 03/04/22 @ 5pm and Follow up with  Dr. Delton Coombes on 03/09/22. CT scan  follow up appointment ordered by Dr. Sondra Come to be cancelled. Patient agrees. Patient to call office with any questions regarding her radiation treatment.

## 2022-02-14 ENCOUNTER — Other Ambulatory Visit: Payer: Self-pay | Admitting: Internal Medicine

## 2022-02-14 ENCOUNTER — Other Ambulatory Visit: Payer: Self-pay | Admitting: Family Medicine

## 2022-02-14 DIAGNOSIS — F419 Anxiety disorder, unspecified: Secondary | ICD-10-CM

## 2022-03-03 ENCOUNTER — Inpatient Hospital Stay: Payer: Medicare HMO | Attending: Hematology

## 2022-03-03 DIAGNOSIS — F17210 Nicotine dependence, cigarettes, uncomplicated: Secondary | ICD-10-CM | POA: Diagnosis not present

## 2022-03-03 DIAGNOSIS — E559 Vitamin D deficiency, unspecified: Secondary | ICD-10-CM | POA: Diagnosis not present

## 2022-03-03 DIAGNOSIS — Z923 Personal history of irradiation: Secondary | ICD-10-CM | POA: Insufficient documentation

## 2022-03-03 DIAGNOSIS — C3432 Malignant neoplasm of lower lobe, left bronchus or lung: Secondary | ICD-10-CM | POA: Insufficient documentation

## 2022-03-03 DIAGNOSIS — D649 Anemia, unspecified: Secondary | ICD-10-CM | POA: Diagnosis not present

## 2022-03-03 DIAGNOSIS — C3492 Malignant neoplasm of unspecified part of left bronchus or lung: Secondary | ICD-10-CM

## 2022-03-03 LAB — CBC WITH DIFFERENTIAL/PLATELET
Abs Immature Granulocytes: 0.04 10*3/uL (ref 0.00–0.07)
Basophils Absolute: 0 10*3/uL (ref 0.0–0.1)
Basophils Relative: 0 %
Eosinophils Absolute: 0.1 10*3/uL (ref 0.0–0.5)
Eosinophils Relative: 1 %
HCT: 37.5 % (ref 36.0–46.0)
Hemoglobin: 11.9 g/dL — ABNORMAL LOW (ref 12.0–15.0)
Immature Granulocytes: 0 %
Lymphocytes Relative: 24 %
Lymphs Abs: 2.8 10*3/uL (ref 0.7–4.0)
MCH: 25.1 pg — ABNORMAL LOW (ref 26.0–34.0)
MCHC: 31.7 g/dL (ref 30.0–36.0)
MCV: 79.1 fL — ABNORMAL LOW (ref 80.0–100.0)
Monocytes Absolute: 1 10*3/uL (ref 0.1–1.0)
Monocytes Relative: 9 %
Neutro Abs: 7.4 10*3/uL (ref 1.7–7.7)
Neutrophils Relative %: 66 %
Platelets: 285 10*3/uL (ref 150–400)
RBC: 4.74 MIL/uL (ref 3.87–5.11)
RDW: 18.1 % — ABNORMAL HIGH (ref 11.5–15.5)
WBC Morphology: REACTIVE
WBC: 11.4 10*3/uL — ABNORMAL HIGH (ref 4.0–10.5)
nRBC: 0 % (ref 0.0–0.2)

## 2022-03-03 LAB — COMPREHENSIVE METABOLIC PANEL
ALT: 27 U/L (ref 0–44)
AST: 30 U/L (ref 15–41)
Albumin: 4.1 g/dL (ref 3.5–5.0)
Alkaline Phosphatase: 129 U/L — ABNORMAL HIGH (ref 38–126)
Anion gap: 9 (ref 5–15)
BUN: 7 mg/dL — ABNORMAL LOW (ref 8–23)
CO2: 23 mmol/L (ref 22–32)
Calcium: 9 mg/dL (ref 8.9–10.3)
Chloride: 101 mmol/L (ref 98–111)
Creatinine, Ser: 1.07 mg/dL — ABNORMAL HIGH (ref 0.44–1.00)
GFR, Estimated: 57 mL/min — ABNORMAL LOW (ref >60)
Glucose, Bld: 142 mg/dL — ABNORMAL HIGH (ref 70–99)
Potassium: 3.7 mmol/L (ref 3.5–5.1)
Sodium: 133 mmol/L — ABNORMAL LOW (ref 135–145)
Total Bilirubin: 0.5 mg/dL (ref 0.3–1.2)
Total Protein: 7.6 g/dL (ref 6.5–8.1)

## 2022-03-04 ENCOUNTER — Ambulatory Visit (HOSPITAL_COMMUNITY)
Admission: RE | Admit: 2022-03-04 | Discharge: 2022-03-04 | Disposition: A | Discharge status: Home / Self Care | Payer: Medicare HMO | Source: Ambulatory Visit | Attending: Hematology | Admitting: Hematology

## 2022-03-04 DIAGNOSIS — C3492 Malignant neoplasm of unspecified part of left bronchus or lung: Secondary | ICD-10-CM | POA: Insufficient documentation

## 2022-03-04 DIAGNOSIS — J479 Bronchiectasis, uncomplicated: Secondary | ICD-10-CM | POA: Diagnosis not present

## 2022-03-04 DIAGNOSIS — R918 Other nonspecific abnormal finding of lung field: Secondary | ICD-10-CM | POA: Diagnosis not present

## 2022-03-04 DIAGNOSIS — C3490 Malignant neoplasm of unspecified part of unspecified bronchus or lung: Secondary | ICD-10-CM | POA: Diagnosis not present

## 2022-03-04 MED ORDER — IOHEXOL 300 MG/ML  SOLN
75.0000 mL | Freq: Once | INTRAMUSCULAR | Status: AC | PRN
  Administered 2022-03-04: 75 mL via INTRAVENOUS

## 2022-03-05 LAB — MISC LABCORP TEST (SEND OUT): Labcorp test code: 81950

## 2022-03-08 ENCOUNTER — Ambulatory Visit: Payer: Medicare HMO | Admitting: Gastroenterology

## 2022-03-09 ENCOUNTER — Inpatient Hospital Stay: Payer: Medicare HMO | Admitting: Hematology

## 2022-03-09 NOTE — Progress Notes (Shared)
[place] [address] [place] [address]    Clinic Day:  03/09/2022  Referring physician: [name], MD  Patient Care Team: [name], MD as PCP - General Rothbart, [name], MD (Inactive) (Cardiology) Gala Romney [name], MD (Gastroenterology) Teena Irani, PTA as Physical Therapy Assistant (Physical Therapy) Leta Baptist, MD as Consulting Physician (Otolaryngology)   ASSESSMENT & PLAN:   Assessment: 1.  Adenocarcinoma of left lower lobe of lung: -She is followed by Dr. Lamonte Sakai and left lower lobe lung nodule was being watched closely for the last few years. -She had increase in size of the left lower lobe lung nodule recently on CT scan. -PET scan on 12/09/2019 showed 2 cm left lower lobe pulmonary nodule with some solid elements with maximum SUV 4.7.  No adenopathy reported. -Bronchoscopy and biopsy of the left lower lobe nodule consistent with adenocarcinoma.  Left lower lobe brushings also positive for adenocarcinoma.  Left upper lobe brushings did not show any malignant cells.   2.  Social/family history: - Current active smoker, half pack per day since age 24. -Lives with husband and brother at home.  Activity is limited secondary to back and hip pain. -Brother had throat cancer and paternal cousin had throat cancer.  Plan: 1.  Clinical stage I adenocarcinoma of left lower lobe of lung: - She was lost to follow-up since December 2021.  She was seen by Dr. Kipp Brood from [place] [facility].  Surgery was not offered as she was continuing to smoke. - Brain MRI on 10/25/2021 was negative for metastatic disease. - PET scan on 10/21/2021 showed perihilar right upper lobe nodule suspicious for bronchogenic carcinoma.  Left lower lobe biopsy-proven nodule has mild interval increase in size with similar uptake.  5 mm right upper lobe nodule too small to be characterized on PET scan on increased 2 to 3 mm from CT scan from 04/24/2020.  No other signs of  metastatic disease. - Recommend consultation with radiation oncology for possible SBRT. - RTC 4 months with CT scan 3 months after completing radiation.  No orders of the defined types were placed in this encounter.     Beverly Gust Oliver,acting as a scribe for Derek Jack, MD.,have documented all relevant documentation on the behalf of Derek Jack, MD,as directed by  Derek Jack, MD while in the presence of Derek Jack, MD.   ***  Kelly Douglas   2/21/20248:22 AM  CHIEF COMPLAINT:   Diagnosis: left lung cancer    Cancer Staging  No matching staging information was found for the patient.   Prior Therapy: none  Current Therapy:  None   HISTORY OF PRESENT ILLNESS:   Oncology History   No history exists.     INTERVAL HISTORY:   Kelly Douglas is a 67 y.o. female presenting to clinic today for follow up of left lung cancer. She was last seen by me on 11/01/2021.  She was seen in the ED on 01/18/2022 for minimal hypokalemia, largely no significant electrolyte or blood count disturbance and her chest Xray and interpretation revealed no lobar opacity or pneumothorax     Today, she states that she is doing well overall. Her appetite level is at ***%. Her energy level is at ***%. She denies any {oncneg:[id number]}.    PAST MEDICAL HISTORY:   Past Medical History: Past Medical History:  Diagnosis Date   Anxiety    Arteriosclerotic cardiovascular disease (ASCVD) 2013   coronary calcification-left main, LAD and CX;  small pericardial effusion   Arthritis    hips   Cancer (HCC)    Lung Cancer    Chronic back pain    Chronic bronchitis    Chronic lung disease    Chronic scarring and volume loss-left lung; characteristics of a chronic infectious process-possible MAI   Depression    Dyspnea    occasional    GERD (gastroesophageal reflux disease)    Dr Gala Romney EGD 09/2009->esophagitis, sm HH, antral erosions, atonic esophagus   History of radiation  therapy    Left and Right Lung- 11/24/21-12/07/21- Dr. Gery Pray   Hyperlipidemia    Hypertension    Hypothyroid 1981 approx   Scleroderma ([place])    Seasonal allergies    Syncope    Multiple spells over the past 40+ years, likely neurocardiogenic   Tobacco abuse 06/24/2009    Surgical History: Past Surgical History:  Procedure Laterality Date   BILATERAL SALPINGOOPHORECTOMY  2013    Dr. Elonda Husky; uterus remains in situ   BRONCHIAL BIOPSY  11/21/2019   Procedure: BRONCHIAL BIOPSIES;  Surgeon: Collene Gobble, MD;  Location: [place];  Service: Pulmonary;;   BRONCHIAL BRUSHINGS  11/21/2019   Procedure: BRONCHIAL BRUSHINGS;  Surgeon: Collene Gobble, MD;  Location: [place];  Service: Pulmonary;;   BRONCHIAL NEEDLE ASPIRATION BIOPSY  11/21/2019   Procedure: BRONCHIAL NEEDLE ASPIRATION BIOPSIES;  Surgeon: Collene Gobble, MD;  Location: [place];  Service: Pulmonary;;   BRONCHIAL WASHINGS  11/21/2019   Procedure: BRONCHIAL WASHINGS;  Surgeon: Collene Gobble, MD;  Location: [place];  Service: Pulmonary;;   COLONOSCOPY  09/28/09   anal papilla otherwise normal   COLONOSCOPY N/A 08/29/2012   MGQ:QPYPPJK polyp-removed as described above. tubular adenoma   COLONOSCOPY WITH PROPOFOL N/A 11/11/2019   Procedure: COLONOSCOPY WITH PROPOFOL;  Surgeon: Daneil Dolin, MD;  [facility];  Service: Endoscopy;  Laterality: N/A;  2:30pm   ESOPHAGOGASTRODUODENOSCOPY  10/07/09   Dr. Barrie Dunker of esophageal mucosa, diffusely ?esophagitis (bx benign), small HH/antral erosions, erythema bx benign. 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 Hiatal hernia   FUDUCIAL PLACEMENT  10/18/2017   Procedure: PLACEMENT OF 3 FUDUCIAL INTO LEFT LOWER LOBE OF LUNG-TARGET 1;  Surgeon: Collene Gobble, MD;  Location: [facility];  Service: Thoracic;;   LAPAROSCOPIC APPENDECTOMY  07/16/2011   Procedure: APPENDECTOMY LAPAROSCOPIC;  Surgeon: Donato Heinz, MD;  Location: [facility];  Service: General;  Laterality: N/A;   POLYPECTOMY  11/11/2019   Procedure: POLYPECTOMY;  Surgeon: Daneil Dolin, MD;  [facility];  Service: Endoscopy;;   TUBAL LIGATION     VIDEO BRONCHOSCOPY  10/18/2017   VIDEO BRONCHOSCOPY WITH ENDOBRONCHIAL NAVIGATION N/A 10/18/2017   Procedure: VIDEO BRONCHOSCOPY WITH ENDOBRONCHIAL NAVIGATION;  Surgeon: Collene Gobble, MD;  Location: [place];  Service: Thoracic;  Laterality: N/A;   VIDEO BRONCHOSCOPY WITH ENDOBRONCHIAL NAVIGATION Left 11/21/2019   Procedure: VIDEO BRONCHOSCOPY WITH ENDOBRONCHIAL NAVIGATION;  Surgeon: Collene Gobble, MD;  [facility];  Service: Pulmonary;  Laterality: Left;    Social History: Social History   Socioeconomic History   Marital status: Married    Spouse name: Not on file   Number of children: 2   Years of education: Not on file   Highest education level: Not on file  Occupational History   Occupation: disabled    Employer: NOT EMPLOYED  Tobacco  Use   Smoking status: Every Day    Packs/day: 0.50    Years: 45.00    Total pack years: 22.50    Types: Cigarettes    Start date: 07/14/1971   Smokeless tobacco: Never   Tobacco comments:    0.5 packs of cigarettes smoked daily ARJ 12/31/19  Vaping Use   Vaping Use: Never used  Substance and Sexual Activity   Alcohol use: Not Currently    Alcohol/week: 0.0 standard drinks of alcohol    Comment: quit June 2012-used to drink 6 pack daily, occasional beer per pt 11/20/19   Drug use: No   Sexual activity: Yes    Birth control/protection: Post-menopausal  Other Topics Concern   Not on file  Social History Narrative   Not on file   Social Determinants of Health   Financial Resource Strain: Low Risk  (11/25/2020)   Overall Financial Resource Strain (CARDIA)    Difficulty of Paying Living  Expenses: Not hard at all  Food Insecurity: No Food Insecurity (11/25/2020)   Hunger Vital Sign    Worried About Running Out of Food in the Last Year: Never true    Ran Out of Food in the Last Year: Never true  Transportation Needs: No Transportation Needs (11/25/2020)   PRAPARE - Hydrologist (Medical): No    Lack of Transportation (Non-Medical): No  Physical Activity: Insufficiently Active (11/25/2020)   Exercise Vital Sign    Days of Exercise per Week: 3 days    Minutes of Exercise per Session: 30 min  Stress: No Stress Concern Present (11/25/2020)   [place]    Feeling of Stress : Not at all  Social Connections: Moderately Integrated (11/25/2020)   Social Connection and Isolation Panel [NHANES]    Frequency of Communication with Friends and Family: Three times a week    Frequency of Social Gatherings with Friends and Family: Once a week    Attends Religious Services: More than 4 times per year    Active Member of [employer] or Organizations: No    Attends Archivist Meetings: Never    Marital Status: Married  Human resources officer Violence: Not At Risk (11/25/2020)   Humiliation, Afraid, Rape, and Kick questionnaire    Fear of Current or Ex-Partner: No    Emotionally Abused: No    Physically Abused: No    Sexually Abused: No    Family History: Family History  Problem Relation Age of Onset   Aneurysm Mother    Stroke Father    Aneurysm Father    Hypertension Sister    Cancer Brother        throat cancer   Coronary artery disease Brother    Hypertension Brother    Down syndrome Brother    Hypertension Brother    Hypertension Brother        father/mother   Throat cancer Brother 56       throat cancer   Diabetes Brother    Diabetes Brother    Anesthesia problems Neg Hx    Hypotension Neg Hx    Malignant hyperthermia Neg Hx    Pseudochol deficiency Neg Hx    Colon cancer Neg  Hx     Current Medications:  Current Outpatient Medications:    acetaminophen (TYLENOL) 650 MG CR tablet, Take 1,300 mg by mouth every 8 (eight) hours as needed for pain. , Disp: , Rfl:    albuterol (VENTOLIN HFA)  108 (90 Base) MCG/ACT inhaler, Inhale 1-2 puffs into the lungs every 6 (six) hours as needed for wheezing or shortness of breath., Disp: 18 each, Rfl: 1   amLODipine (NORVASC) 5 MG tablet, TAKE 1 TABLET EVERY DAY, Disp: 90 tablet, Rfl: 2   atorvastatin (LIPITOR) 10 MG tablet, TAKE 1 TABLET EVERY DAY, Disp: 90 tablet, Rfl: 1   Cholecalciferol (VITAMIN D) 125 MCG (5000 UT) CAPS, Take 5,000 Units by mouth daily. , Disp: , Rfl:    DULoxetine (CYMBALTA) 60 MG capsule, TAKE 1 CAPSULE EVERY DAY, Disp: 90 capsule, Rfl: 3   famotidine (PEPCID) 20 MG tablet, TAKE 1 TABLET EVERY DAY, Disp: 90 tablet, Rfl: 2   levothyroxine (SYNTHROID) 75 MCG tablet, TAKE 1 TABLET EVERY OTHER DAY ALTERNATING WITH 1/2 TABLET EVERY OTHER DAY, Disp: 66 tablet, Rfl: 3   magnesium oxide (MAG-OX) 400 (240 Mg) MG tablet, TAKE 1 TABLET THREE TIMES DAILY, Disp: 270 tablet, Rfl: 2   meloxicam (MOBIC) 7.5 MG tablet, TAKE 1 TABLET EVERY DAY, Disp: 90 tablet, Rfl: 3   mirtazapine (REMERON) 7.5 MG tablet, TAKE 1 TABLET AT BEDTIME, Disp: 90 tablet, Rfl: 3   montelukast (SINGULAIR) 10 MG tablet, TAKE 1 TABLET AT BEDTIME, Disp: 90 tablet, Rfl: 1   Multiple Vitamin (MULTIVITAMIN WITH MINERALS) TABS tablet, Take 1 tablet by mouth daily., Disp: , Rfl:    pantoprazole (PROTONIX) 40 MG tablet, TAKE 1 TABLET TWICE DAILY, Disp: 180 tablet, Rfl: 3   Allergies: Allergies  Allergen Reactions   Aspirin Other (See Comments)    Nose bleeds    REVIEW OF SYSTEMS:   Review of Systems - Oncology   VITALS:   There were no vitals taken for this visit.  Wt Readings from Last 3 Encounters:  02/03/22 171 lb 1.9 oz (77.6 kg)  01/27/22 173 lb 12.8 oz (78.8 kg)  11/04/21 169 lb 6 oz (76.8 kg)    There is no height or weight on file to  calculate BMI.  Performance status (ECOG): {CHL ONC [id number]  PHYSICAL EXAM:   Physical Exam  LABS:      Latest Ref Rng & Units 03/03/2022    4:11 PM 01/18/2022    9:53 PM 09/10/2021    2:49 PM  CBC  WBC 4.0 - 10.5 K/uL 11.4  10.1  11.6   Hemoglobin 12.0 - 15.0 g/dL 11.9  13.6  13.0   Hematocrit 36.0 - 46.0 % 37.5  42.5  40.4   Platelets 150 - 400 K/uL 285  393  378       Latest Ref Rng & Units 03/03/2022    4:11 PM 02/03/2022    2:08 PM 01/18/2022    9:53 PM  CMP  Glucose 70 - 99 mg/dL 142  117  150   BUN 8 - 23 mg/dL 7  11  10    Creatinine 0.44 - 1.00 mg/dL 1.07  1.07  0.97   Sodium 135 - 145 mmol/L 133  140  134   Potassium 3.5 - 5.1 mmol/L 3.7  4.2  3.4   Chloride 98 - 111 mmol/L 101  102  96   CO2 22 - 32 mmol/L 23  22  22    Calcium 8.9 - 10.3 mg/dL 9.0  10.2  10.0   Total Protein 6.5 - 8.1 g/dL 7.6  7.3  9.5   Total Bilirubin 0.3 - 1.2 mg/dL 0.5  0.4  0.7   Alkaline Phos 38 - 126 U/L 129  125  145  AST 15 - 41 U/L 30  27  35   ALT 0 - 44 U/L 27  25  26       No results found for: "CEA1", "CEA" / No results found for: "CEA1", "CEA" No results found for: "PSA1" No results found for: "QZE092" No results found for: "CAN125"  No results found for: "TOTALPROTELP", "ALBUMINELP", "A1GS", "A2GS", "BETS", "BETA2SER", "GAMS", "MSPIKE", "SPEI" Lab Results  Component Value Date   TIBC 398 02/01/2013   TIBC 377 04/25/2012   FERRITIN 30 05/03/2013   FERRITIN 22 02/01/2013   FERRITIN 13 12/03/2012   IRONPCTSAT 27 02/01/2013   IRONPCTSAT 16 (L) 04/25/2012   No results found for: "LDH"   STUDIES:   CT Chest W Contrast  Result Date: 03/06/2022 CLINICAL DATA:  Non-small-cell lung cancer. Restaging. * Tracking Code: BO * EXAM: CT CHEST WITH CONTRAST TECHNIQUE: Multidetector CT imaging of the chest was performed during intravenous contrast administration. RADIATION DOSE REDUCTION: This exam was performed according to the departmental dose-optimization program which  includes automated exposure control, adjustment of the mA and/or kV according to patient size and/or use of iterative reconstruction technique. CONTRAST:  63mL OMNIPAQUE IOHEXOL 300 MG/ML  SOLN COMPARISON:  None Available. FINDINGS: Cardiovascular: The heart size is normal. No substantial pericardial effusion. Coronary artery calcification is evident. Mild atherosclerotic calcification is noted in the wall of the thoracic aorta. As noted previously, left main pulmonary artery appears hypoplastic. Mediastinum/Nodes: 12 mm short juxta cardiac lymph node along the lower esophagus (119/2) was 11 mm previously. Otherwise, no mediastinal lymphadenopathy. There is no hilar lymphadenopathy. Patulous esophagus similar to prior without wall thickening. There is no axillary lymphadenopathy. Lungs/Pleura: Volume loss left hemithorax is similar to prior. Mild apical bronchiectasis and posterior apical pleuroparenchymal scarring is similar to prior. Central clustered ground-glass nodularity in the left apex remains unchanged compatible with sequelae of prior infection/inflammation. Left lower lobe pulmonary nodule measured previously at 2.5 x 2.0 cm is 1.8 x 1.7 cm today on image 100/4. Interval progression of interstitial and alveolar opacity around and inferior to this nodule is presumably evolving post radiation scar. 6 mm anterior left lower lobe nodule on 101/4 is stable. 6 mm right upper lobe nodule on 43/4 was 5 mm previously. No new suspicious pulmonary nodule or mass on today's study. There is no evidence of pleural effusion. Upper Abdomen: Visualized portion of the upper abdomen is unremarkable. Musculoskeletal: No worrisome lytic or sclerotic osseous abnormality. IMPRESSION: 1. Interval decrease in size of the left lower lobe pulmonary nodule. Interval progression of interstitial and alveolar opacity around and inferior to this nodule is presumably evolving scar from radiation. Close attention on follow-up recommended.  2. 6 mm right upper lobe pulmonary nodule was 5 mm previously and described as increased in size on the previous exam. Given continued apparent progression, close attention on follow-up warranted. 3. Stable 6 mm anterior left lower lobe pulmonary nodule. 4. 12 mm short juxta cardiac lymph node along the lower esophagus was 11 mm previously. This finding also merits attention on subsequent studies. 5.  Aortic Atherosclerosis (ICD10-I70.0). Electronically Signed   By: Misty Stanley M.D.   On: 03/06/2022 08:20

## 2022-03-17 ENCOUNTER — Inpatient Hospital Stay: Payer: Medicare HMO | Admitting: Hematology

## 2022-03-17 VITALS — BP 134/86 | HR 105 | Temp 98.7°F | Resp 19 | Ht 68.0 in | Wt 172.7 lb

## 2022-03-17 DIAGNOSIS — D649 Anemia, unspecified: Secondary | ICD-10-CM | POA: Diagnosis not present

## 2022-03-17 DIAGNOSIS — C3432 Malignant neoplasm of lower lobe, left bronchus or lung: Secondary | ICD-10-CM | POA: Diagnosis not present

## 2022-03-17 DIAGNOSIS — C3492 Malignant neoplasm of unspecified part of left bronchus or lung: Secondary | ICD-10-CM

## 2022-03-17 DIAGNOSIS — F17210 Nicotine dependence, cigarettes, uncomplicated: Secondary | ICD-10-CM | POA: Diagnosis not present

## 2022-03-17 DIAGNOSIS — Z923 Personal history of irradiation: Secondary | ICD-10-CM | POA: Diagnosis not present

## 2022-03-17 DIAGNOSIS — E559 Vitamin D deficiency, unspecified: Secondary | ICD-10-CM | POA: Diagnosis not present

## 2022-03-17 NOTE — Progress Notes (Signed)
[facility] [address], [place] [address]    Clinic Day:  03/17/2022  Referring physician: [name], MD  Patient Care Team: [name], MD as PCP - General Rothbart, [name], MD (Inactive) (Cardiology) Gala Romney [name], MD (Gastroenterology) Teena Irani, PTA as Physical Therapy Assistant (Physical Therapy) Leta Baptist, MD as Consulting Physician (Otolaryngology)   ASSESSMENT & PLAN:   Assessment: 1.  Adenocarcinoma of left lower lobe of lung: -She is followed by Dr. Lamonte Sakai and left lower lobe lung nodule was being watched closely for the last few years. -She had increase in size of the left lower lobe lung nodule recently on CT scan. -PET scan on 12/09/2019 showed 2 cm left lower lobe pulmonary nodule with some solid elements with maximum SUV 4.7.  No adenopathy reported. -Bronchoscopy and biopsy of the left lower lobe nodule consistent with adenocarcinoma.  Left lower lobe brushings also positive for adenocarcinoma.  Left upper lobe brushings did not show any malignant cells. - XRT to the left and right lungs lesions from 11/24/2021 through 12/07/2021   2.  Social/family history: - Current active smoker, half pack per day since age 89. -Lives with husband and brother at home.  Activity is limited secondary to back and hip pain. -Brother had throat cancer and paternal cousin had throat cancer.  Plan: 1.  Clinical stage I adenocarcinoma of left lower lobe of lung: - She has completed radiation therapy to the left and right lungs from 11/24/2021 through 12/07/2021. - She felt slightly tired after radiation. - Energy levels have improved. - Reviewed CT chest (03/04/2022): Decrease in size of the left lower lobe lung nodule.  6 mm right upper lobe lung nodule, 5 mm previously.  Stable 6 mm anterior left lower lobe lung nodule.  12 mm short Jext or cardiac lymph node along the lower esophagus, 11 mm previously. - I have recommended follow-up CT  scan in 3 months.  2.  Microcytic anemia: - CBC showed hemoglobin 11.9 with MCV 79.  Will check ferritin and iron panel next visit.  Orders Placed This Encounter  Procedures   CT Chest W Contrast    Standing Status:   Future    Standing Expiration Date:   03/17/2023    Order Specific Question:   If indicated for the ordered procedure, I authorize the administration of contrast media per Radiology protocol    Answer:   Yes    Order Specific Question:   Does the patient have a contrast media/X-ray dye allergy?    Answer:   No    Order Specific Question:   Preferred imaging location?    Answer:   [facility]    Order Specific Question:   Release to patient    Answer:   Immediate [1]   CBC with Differential/Platelet    Standing Status:   Future    Standing Expiration Date:   03/17/2023    Order Specific Question:   Release to patient    Answer:   Immediate   Comprehensive metabolic panel    Standing Status:   Future    Standing Expiration Date:   03/17/2023    Order Specific Question:   Release to patient    Answer:   Immediate   Ferritin    Standing Status:   Future    Standing Expiration Date:   03/17/2023    Order Specific Question:   Release to patient    Answer:  Immediate   Iron and TIBC    Standing Status:   Future    Standing Expiration Date:   03/17/2023    Order Specific Question:   Release to patient    Answer:   Immediate      Beverly Gust Oliver,acting as a scribe for Derek Jack, MD.,have documented all relevant documentation on the behalf of Derek Jack, MD,as directed by  Derek Jack, MD while in the presence of Derek Jack, MD.   I, Derek Jack MD, have reviewed the above documentation for accuracy and completeness, and I agree with the above.   Derek Jack, MD   2/29/20246:52 PM  CHIEF COMPLAINT:   Diagnosis: left lung cancer    Cancer Staging  No matching staging information was found for the  patient.   Prior Therapy: Radiation therapy  Current Therapy: Surveillance   HISTORY OF PRESENT ILLNESS:   Oncology History   No history exists.     INTERVAL HISTORY:   Kelly Douglas is a 67 y.o. female presenting to clinic today for follow up of left lung cancer . She was last seen by me on 11/01/2021.  She was in the ED on 01/02-03/2022 for cough,body aches, and vomiting.   Today, she states that she is doing well overall. Her appetite level is at 80%. Her energy level is at 70%.She states she felt fatigued after radiation. She denies any recent coughs. Her appetite is okay.   PAST MEDICAL HISTORY:   Past Medical History: Past Medical History:  Diagnosis Date   Anxiety    Arteriosclerotic cardiovascular disease (ASCVD) 2013   coronary calcification-left main, LAD and CX; small pericardial effusion   Arthritis    hips   Cancer (HCC)    Lung Cancer    Chronic back pain    Chronic bronchitis    Chronic lung disease    Chronic scarring and volume loss-left lung; characteristics of a chronic infectious process-possible MAI   Depression    Dyspnea    occasional    GERD (gastroesophageal reflux disease)    Dr Gala Romney EGD 09/2009->esophagitis, sm HH, antral erosions, atonic esophagus   History of radiation therapy    Left and Right Lung- 11/24/21-12/07/21- Dr. Gery Pray   Hyperlipidemia    Hypertension    Hypothyroid 1981 approx   Scleroderma ([place])    Seasonal allergies    Syncope    Multiple spells over the past 40+ years, likely neurocardiogenic   Tobacco abuse 06/24/2009    Surgical History: Past Surgical History:  Procedure Laterality Date   BILATERAL SALPINGOOPHORECTOMY  2013    Dr. Elonda Husky; uterus remains in situ   BRONCHIAL BIOPSY  11/21/2019   Procedure: BRONCHIAL BIOPSIES;  Surgeon: Collene Gobble, MD;  Location: [facility] ENDOSCOPY;  Service: Pulmonary;;   BRONCHIAL BRUSHINGS  11/21/2019   Procedure: BRONCHIAL BRUSHINGS;  Surgeon: Collene Gobble, MD;  Location: [facility]  ENDOSCOPY;  Service: Pulmonary;;   BRONCHIAL NEEDLE ASPIRATION BIOPSY  11/21/2019   Procedure: BRONCHIAL NEEDLE ASPIRATION BIOPSIES;  Surgeon: Collene Gobble, MD;  Location: [facility] ENDOSCOPY;  Service: Pulmonary;;   BRONCHIAL WASHINGS  11/21/2019   Procedure: BRONCHIAL WASHINGS;  Surgeon: Collene Gobble, MD;  Location: [facility] ENDOSCOPY;  Service: Pulmonary;;   COLONOSCOPY  09/28/09   anal papilla otherwise normal   COLONOSCOPY N/A 08/29/2012   [id number] polyp-removed as described above. tubular adenoma   COLONOSCOPY WITH PROPOFOL N/A 11/11/2019   Procedure: COLONOSCOPY WITH PROPOFOL;  Surgeon: Daneil Dolin, MD;  Location: [facility] ENDO  SUITE;  Service: Endoscopy;  Laterality: N/A;  2:30pm   ESOPHAGOGASTRODUODENOSCOPY  10/07/09   Dr. Barrie Dunker of esophageal mucosa, diffusely ?esophagitis (bx benign), small HH/antral erosions, erythema bx benign. 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 Hiatal hernia   FUDUCIAL PLACEMENT  10/18/2017   Procedure: PLACEMENT OF 3 FUDUCIAL INTO LEFT LOWER LOBE OF LUNG-TARGET 1;  Surgeon: Collene Gobble, MD;  Location: [facility];  Service: Thoracic;;   LAPAROSCOPIC APPENDECTOMY  07/16/2011   Procedure: APPENDECTOMY LAPAROSCOPIC;  Surgeon: Donato Heinz, MD;  Location: [facility] ORS;  Service: General;  Laterality: N/A;   POLYPECTOMY  11/11/2019   Procedure: POLYPECTOMY;  Surgeon: Daneil Dolin, MD;  Location: [facility] ENDO SUITE;  Service: Endoscopy;;   TUBAL LIGATION     VIDEO BRONCHOSCOPY  10/18/2017   VIDEO BRONCHOSCOPY WITH ENDOBRONCHIAL NAVIGATION N/A 10/18/2017   Procedure: VIDEO BRONCHOSCOPY WITH ENDOBRONCHIAL NAVIGATION;  Surgeon: Collene Gobble, MD;  Location: [facility];  Service: Thoracic;  Laterality: N/A;   VIDEO BRONCHOSCOPY WITH ENDOBRONCHIAL NAVIGATION Left 11/21/2019   Procedure: VIDEO  BRONCHOSCOPY WITH ENDOBRONCHIAL NAVIGATION;  Surgeon: Collene Gobble, MD;  Location: [facility] ENDOSCOPY;  Service: Pulmonary;  Laterality: Left;    Social History: Social History   Socioeconomic History   Marital status: Married    Spouse name: Not on file   Number of children: 2   Years of education: Not on file   Highest education level: Not on file  Occupational History   Occupation: disabled    Employer: NOT EMPLOYED  Tobacco Use   Smoking status: Every Day    Packs/day: 0.50    Years: 45.00    Total pack years: 22.50    Types: Cigarettes    Start date: 07/14/1971   Smokeless tobacco: Never   Tobacco comments:    0.5 packs of cigarettes smoked daily ARJ 12/31/19  Vaping Use   Vaping Use: Never used  Substance and Sexual Activity   Alcohol use: Not Currently    Alcohol/week: 0.0 standard drinks of alcohol    Comment: quit June 2012-used to drink 6 pack daily, occasional beer per pt 11/20/19   Drug use: No   Sexual activity: Yes    Birth control/protection: Post-menopausal  Other Topics Concern   Not on file  Social History Narrative   Not on file   Social Determinants of Health   Financial Resource Strain: Low Risk  (11/25/2020)   Overall Financial Resource Strain (CARDIA)    Difficulty of Paying Living Expenses: Not hard at all  Food Insecurity: No Food Insecurity (11/25/2020)   Hunger Vital Sign    Worried About Running Out of Food in the Last Year: Never true    Ran Out of Food in the Last Year: Never true  Transportation Needs: No Transportation Needs (11/25/2020)   PRAPARE - Hydrologist (Medical): No    Lack of Transportation (Non-Medical): No  Physical Activity: Insufficiently Active (11/25/2020)   Exercise Vital Sign    Days of Exercise per Week: 3 days    Minutes of Exercise per Session: 30 min  Stress: No Stress Concern Present (11/25/2020)   [place]    Feeling  of Stress : Not at all  Social Connections: Moderately Integrated (11/25/2020)   Social Connection and Isolation Panel [NHANES]    Frequency of Communication with Friends  and Family: Three times a week    Frequency of Social Gatherings with Friends and Family: Once a week    Attends Religious Services: More than 4 times per year    Active Member of [employer] or Organizations: No    Attends Archivist Meetings: Never    Marital Status: Married  Human resources officer Violence: Not At Risk (11/25/2020)   Humiliation, Afraid, Rape, and Kick questionnaire    Fear of Current or Ex-Partner: No    Emotionally Abused: No    Physically Abused: No    Sexually Abused: No    Family History: Family History  Problem Relation Age of Onset   Aneurysm Mother    Stroke Father    Aneurysm Father    Hypertension Sister    Cancer Brother        throat cancer   Coronary artery disease Brother    Hypertension Brother    Down syndrome Brother    Hypertension Brother    Hypertension Brother        father/mother   Throat cancer Brother 48       throat cancer   Diabetes Brother    Diabetes Brother    Anesthesia problems Neg Hx    Hypotension Neg Hx    Malignant hyperthermia Neg Hx    Pseudochol deficiency Neg Hx    Colon cancer Neg Hx     Current Medications:  Current Outpatient Medications:    acetaminophen (TYLENOL) 650 MG CR tablet, Take 1,300 mg by mouth every 8 (eight) hours as needed for pain. , Disp: , Rfl:    albuterol (VENTOLIN HFA) 108 (90 Base) MCG/ACT inhaler, Inhale 1-2 puffs into the lungs every 6 (six) hours as needed for wheezing or shortness of breath., Disp: 18 each, Rfl: 1   amLODipine (NORVASC) 5 MG tablet, TAKE 1 TABLET EVERY DAY, Disp: 90 tablet, Rfl: 2   atorvastatin (LIPITOR) 10 MG tablet, TAKE 1 TABLET EVERY DAY, Disp: 90 tablet, Rfl: 1   Cholecalciferol (VITAMIN D) 125 MCG (5000 UT) CAPS, Take 5,000 Units by mouth daily. , Disp: , Rfl:    DULoxetine (CYMBALTA) 60 MG  capsule, TAKE 1 CAPSULE EVERY DAY, Disp: 90 capsule, Rfl: 3   famotidine (PEPCID) 20 MG tablet, TAKE 1 TABLET EVERY DAY, Disp: 90 tablet, Rfl: 2   hydrOXYzine (VISTARIL) 50 MG capsule, Take 50 mg by mouth 2 (two) times daily as needed., Disp: , Rfl:    levothyroxine (SYNTHROID) 75 MCG tablet, TAKE 1 TABLET EVERY OTHER DAY ALTERNATING WITH 1/2 TABLET EVERY OTHER DAY, Disp: 66 tablet, Rfl: 3   magnesium oxide (MAG-OX) 400 (240 Mg) MG tablet, TAKE 1 TABLET THREE TIMES DAILY, Disp: 270 tablet, Rfl: 2   meloxicam (MOBIC) 7.5 MG tablet, TAKE 1 TABLET EVERY DAY, Disp: 90 tablet, Rfl: 3   mirtazapine (REMERON) 7.5 MG tablet, TAKE 1 TABLET AT BEDTIME, Disp: 90 tablet, Rfl: 3   montelukast (SINGULAIR) 10 MG tablet, TAKE 1 TABLET AT BEDTIME, Disp: 90 tablet, Rfl: 1   Multiple Vitamin (MULTIVITAMIN WITH MINERALS) TABS tablet, Take 1 tablet by mouth daily., Disp: , Rfl:    pantoprazole (PROTONIX) 40 MG tablet, TAKE 1 TABLET TWICE DAILY, Disp: 180 tablet, Rfl: 3   Allergies: Allergies  Allergen Reactions   Aspirin Other (See Comments)    Nose bleeds    REVIEW OF SYSTEMS:   Review of Systems  Constitutional:  Negative for chills, fatigue and fever.  HENT:   Negative for lump/mass, mouth sores,  nosebleeds, sore throat and trouble swallowing.   Eyes:  Negative for eye problems.  Respiratory:  Positive for shortness of breath. Negative for cough.   Cardiovascular:  Negative for chest pain, leg swelling and palpitations.  Gastrointestinal:  Negative for abdominal pain, constipation, diarrhea, nausea and vomiting.  Genitourinary:  Negative for bladder incontinence, difficulty urinating, dysuria, frequency, hematuria and nocturia.   Musculoskeletal:  Negative for arthralgias, back pain, flank pain, myalgias and neck pain.  Skin:  Negative for itching and rash.  Neurological:  Negative for dizziness, headaches and numbness.  Hematological:  Does not bruise/bleed easily.  Psychiatric/Behavioral:  Positive  for sleep disturbance (Don't Sleep). Negative for depression and suicidal ideas. The patient is not nervous/anxious.   All other systems reviewed and are negative.    VITALS:   Blood pressure 134/86, pulse (!) 105, temperature 98.7 F (37.1 C), temperature source Oral, resp. rate 19, height '5\' 8"'$  (1.727 m), weight 172 lb 11.2 oz (78.3 kg), SpO2 97 %.  Wt Readings from Last 3 Encounters:  03/17/22 172 lb 11.2 oz (78.3 kg)  02/03/22 171 lb 1.9 oz (77.6 kg)  01/27/22 173 lb 12.8 oz (78.8 kg)    Body mass index is 26.26 kg/m.  Performance status (ECOG): 1 - Symptomatic but completely ambulatory  PHYSICAL EXAM:   Physical Exam Vitals and nursing note reviewed. Exam conducted with a chaperone present.  Constitutional:      Appearance: Normal appearance.  Cardiovascular:     Rate and Rhythm: Normal rate and regular rhythm.     Pulses: Normal pulses.     Heart sounds: Normal heart sounds.  Pulmonary:     Effort: Pulmonary effort is normal.     Breath sounds: Normal breath sounds.  Abdominal:     Palpations: Abdomen is soft. There is no hepatomegaly, splenomegaly or mass.     Tenderness: There is no abdominal tenderness.  Musculoskeletal:     Right lower leg: No edema.     Left lower leg: No edema.  Lymphadenopathy:     Cervical: No cervical adenopathy.     Right cervical: No superficial, deep or posterior cervical adenopathy.    Left cervical: No superficial, deep or posterior cervical adenopathy.     Upper Body:     Right upper body: No supraclavicular or axillary adenopathy.     Left upper body: No supraclavicular or axillary adenopathy.  Neurological:     General: No focal deficit present.     Mental Status: She is alert and oriented to person, place, and time.  Psychiatric:        Mood and Affect: Mood normal.        Behavior: Behavior normal.     LABS:      Latest Ref Rng & Units 03/03/2022    4:11 PM 01/18/2022    9:53 PM 09/10/2021    2:49 PM  CBC  WBC 4.0 -  10.5 K/uL 11.4  10.1  11.6   Hemoglobin 12.0 - 15.0 g/dL 11.9  13.6  13.0   Hematocrit 36.0 - 46.0 % 37.5  42.5  40.4   Platelets 150 - 400 K/uL 285  393  378       Latest Ref Rng & Units 03/03/2022    4:11 PM 02/03/2022    2:08 PM 01/18/2022    9:53 PM  CMP  Glucose 70 - 99 mg/dL 142  117  150   BUN 8 - 23 mg/dL 7  11  10  Creatinine 0.44 - 1.00 mg/dL 1.07  1.07  0.97   Sodium 135 - 145 mmol/L 133  140  134   Potassium 3.5 - 5.1 mmol/L 3.7  4.2  3.4   Chloride 98 - 111 mmol/L 101  102  96   CO2 22 - 32 mmol/L '23  22  22   '$ Calcium 8.9 - 10.3 mg/dL 9.0  10.2  10.0   Total Protein 6.5 - 8.1 g/dL 7.6  7.3  9.5   Total Bilirubin 0.3 - 1.2 mg/dL 0.5  0.4  0.7   Alkaline Phos 38 - 126 U/L 129  125  145   AST 15 - 41 U/L 30  27  35   ALT 0 - 44 U/L '27  25  26      '$ No results found for: "CEA1", "CEA" / No results found for: "CEA1", "CEA" No results found for: "PSA1" No results found for: "[id number]" No results found for: "CAN125"  No results found for: "TOTALPROTELP", "ALBUMINELP", "A1GS", "A2GS", "BETS", "BETA2SER", "GAMS", "MSPIKE", "SPEI" Lab Results  Component Value Date   TIBC 398 02/01/2013   TIBC 377 04/25/2012   FERRITIN 30 05/03/2013   FERRITIN 22 02/01/2013   FERRITIN 13 12/03/2012   IRONPCTSAT 27 02/01/2013   IRONPCTSAT 16 (L) 04/25/2012   No results found for: "LDH"   STUDIES:   CT Chest W Contrast  Result Date: 03/06/2022 CLINICAL DATA:  Non-small-cell lung cancer. Restaging. * Tracking Code: BO * EXAM: CT CHEST WITH CONTRAST TECHNIQUE: Multidetector CT imaging of the chest was performed during intravenous contrast administration. RADIATION DOSE REDUCTION: This exam was performed according to the departmental dose-optimization program which includes automated exposure control, adjustment of the mA and/or kV according to patient size and/or use of iterative reconstruction technique. CONTRAST:  41m OMNIPAQUE IOHEXOL 300 MG/ML  SOLN COMPARISON:  None Available. FINDINGS:  Cardiovascular: The heart size is normal. No substantial pericardial effusion. Coronary artery calcification is evident. Mild atherosclerotic calcification is noted in the wall of the thoracic aorta. As noted previously, left main pulmonary artery appears hypoplastic. Mediastinum/Nodes: 12 mm short juxta cardiac lymph node along the lower esophagus (119/2) was 11 mm previously. Otherwise, no mediastinal lymphadenopathy. There is no hilar lymphadenopathy. Patulous esophagus similar to prior without wall thickening. There is no axillary lymphadenopathy. Lungs/Pleura: Volume loss left hemithorax is similar to prior. Mild apical bronchiectasis and posterior apical pleuroparenchymal scarring is similar to prior. Central clustered ground-glass nodularity in the left apex remains unchanged compatible with sequelae of prior infection/inflammation. Left lower lobe pulmonary nodule measured previously at 2.5 x 2.0 cm is 1.8 x 1.7 cm today on image 100/4. Interval progression of interstitial and alveolar opacity around and inferior to this nodule is presumably evolving post radiation scar. 6 mm anterior left lower lobe nodule on 101/4 is stable. 6 mm right upper lobe nodule on 43/4 was 5 mm previously. No new suspicious pulmonary nodule or mass on today's study. There is no evidence of pleural effusion. Upper Abdomen: Visualized portion of the upper abdomen is unremarkable. Musculoskeletal: No worrisome lytic or sclerotic osseous abnormality. IMPRESSION: 1. Interval decrease in size of the left lower lobe pulmonary nodule. Interval progression of interstitial and alveolar opacity around and inferior to this nodule is presumably evolving scar from radiation. Close attention on follow-up recommended. 2. 6 mm right upper lobe pulmonary nodule was 5 mm previously and described as increased in size on the previous exam. Given continued apparent progression, close attention on follow-up warranted. 3.  Stable 6 mm anterior left lower  lobe pulmonary nodule. 4. 12 mm short juxta cardiac lymph node along the lower esophagus was 11 mm previously. This finding also merits attention on subsequent studies. 5.  Aortic Atherosclerosis (ICD10-I70.0). Electronically Signed   By: Misty Stanley M.D.   On: 03/06/2022 08:20

## 2022-03-17 NOTE — Patient Instructions (Signed)
[facility]  Discharge Instructions  You were seen and examined today by Dr. Delton Coombes.  Dr. Delton Coombes discussed your most recent lab work and CT scan which revealed that everything looks stable. Dr. Delton Coombes will continue to monitor you with a scan  Follow-up as scheduled in 3 months with labs and scan prior.    Thank you for choosing [facility] to provide your oncology and hematology care.   To afford each patient quality time with our provider, please arrive at least 15 minutes before your scheduled appointment time. You may need to reschedule your appointment if you arrive late (10 or more minutes). Arriving late affects you and other patients whose appointments are after yours.  Also, if you miss three or more appointments without notifying the office, you may be dismissed from the clinic at the provider's discretion.    Again, thank you for choosing [facility].  Our hope is that these requests will decrease the amount of time that you wait before being seen by our physicians.   If you have a lab appointment with the [facility] please come in thru the Main Entrance and check in at the main information desk.           _____________________________________________________________  Should you have questions after your visit to [facility], please contact our office at [phone] and follow the prompts.  Our office hours are 8:00 a.m. to 4:30 p.m. Monday - Thursday and 8:00 a.m. to 2:30 p.m. Friday.  Please note that voicemails left after 4:00 p.m. may not be returned until the following business day.  We are closed weekends and all major holidays.  You do have access to a nurse 24-7, just call the main number to the clinic [phone] and do not press any options, hold on the line and a nurse will answer the phone.    For prescription refill requests, have your pharmacy contact our office and  allow 72 hours.    Masks are optional in the cancer centers. If you would like for your care team to wear a mask while they are taking care of you, please let them know. You may have one support person who is at least 67 years old accompany you for your appointments.

## 2022-03-20 ENCOUNTER — Other Ambulatory Visit: Payer: Self-pay | Admitting: Family Medicine

## 2022-03-20 DIAGNOSIS — E782 Mixed hyperlipidemia: Secondary | ICD-10-CM

## 2022-03-29 ENCOUNTER — Encounter: Payer: Self-pay | Admitting: Gastroenterology

## 2022-03-29 ENCOUNTER — Ambulatory Visit (INDEPENDENT_AMBULATORY_CARE_PROVIDER_SITE_OTHER): Payer: Medicare HMO | Admitting: Gastroenterology

## 2022-03-29 VITALS — BP 138/86 | HR 98 | Temp 97.3°F | Ht 69.0 in | Wt 174.6 lb

## 2022-03-29 DIAGNOSIS — K5900 Constipation, unspecified: Secondary | ICD-10-CM

## 2022-03-29 DIAGNOSIS — K219 Gastro-esophageal reflux disease without esophagitis: Secondary | ICD-10-CM

## 2022-03-29 NOTE — Progress Notes (Unsigned)
GI Office Note    Referring Provider: [name], MD Primary Care Physician:  [name], MD Primary Gastroenterologist: [name].Rourk, MD  Date:  03/29/2022  ID:  ANESSA GALANIS, DOB 1955/03/28, MRN [id number]  Chief Complaint   Chief Complaint  Patient presents with   mucus in stools    Patient here today with concerns she has mucus in her stools. She says at times when having a bm she is only able to pass "little balls". She also says she has a history of fecal incontinence.   History of Present Illness  LYRIQ FLAMAND is a 67 y.o. female with a history of anxiety, COPD, depression, GERD, HLD, HTN, hypothyroidism, scleroderma chronic lung disease, and history of colonic adenomas presenting today for evaluation of change in stools with history of adenomatous colon polyps.   Last office visit August 2021. Had recent illness with 10 pound weight loss.  Tried to increase her weight and drink protein supplements.  Recent chest CT with dilated patulous appearing esophagus with air-fluid levels but no mass.  Possibly related to scleroderma but unclear.  Denied any dysphagia.  She is scheduled for colonoscopy given history of adenomatous colon polyps.  Questional history of SIBO given diarrhea improved with course of antibiotics for dental issue.  Colonoscopy October 2021: -polyp in the rectum (tubular adenoma) -Sigmoid and descending diverticulosis -Advised repeat in 5 years, notify office if any rectal bleeding, abdominal pain, or significant change in bowel habits.  Today: Mucus in stools - When she has stools they are usually in little balls and having mucous in her stools for a while. Probably 6 months or more. No melena or brbpr.   Constipation - Having little balls of stool every other day. Has not had a normal looking a bowl movement In about 2 weeks or more and even then it was a small amount. Will have urge to go to the bathroom and only have one little ball  that comes out. Has some occasional diarrhea at times as well. Reports some periumbilical pain at times and gets a cramp in her stomach. This occurs about 3 episodes a month and has been going on since the 1980s, better than it used to be. Pain will last a couple minutes then gets better. Repots she dose feel bloating and gts sight and if she passes gas it gets a little better.   Has been having issues with insomnia. No N/V.   Does have some reflux with occasional vomiting of acid. If she skips her heartburn pill for 2 days she will have this issue. Taking mostly once daily pantoprazole, sometimes twice daily.    Current Outpatient Medications  Medication Sig Dispense Refill   acetaminophen (TYLENOL) 650 MG CR tablet Take 1,300 mg by mouth every 8 (eight) hours as needed for pain.      albuterol (VENTOLIN HFA) 108 (90 Base) MCG/ACT inhaler Inhale 1-2 puffs into the lungs every 6 (six) hours as needed for wheezing or shortness of breath. 18 each 1   amLODipine (NORVASC) 5 MG tablet TAKE 1 TABLET EVERY DAY 90 tablet 2   atorvastatin (LIPITOR) 10 MG tablet TAKE 1 TABLET EVERY DAY 90 tablet 3   Cholecalciferol (VITAMIN D) 125 MCG (5000 UT) CAPS Take 5,000 Units by mouth daily.      DULoxetine (CYMBALTA) 60 MG capsule TAKE 1 CAPSULE EVERY DAY 90 capsule 3   famotidine (PEPCID) 20 MG tablet TAKE 1 TABLET EVERY DAY 90 tablet 2  levothyroxine (SYNTHROID) 75 MCG tablet TAKE 1 TABLET EVERY OTHER DAY ALTERNATING WITH 1/2 TABLET EVERY OTHER DAY 66 tablet 3   magnesium oxide (MAG-OX) 400 (240 Mg) MG tablet TAKE 1 TABLET THREE TIMES DAILY 270 tablet 2   meloxicam (MOBIC) 7.5 MG tablet TAKE 1 TABLET EVERY DAY 90 tablet 3   mirtazapine (REMERON) 7.5 MG tablet TAKE 1 TABLET AT BEDTIME 90 tablet 3   montelukast (SINGULAIR) 10 MG tablet TAKE 1 TABLET AT BEDTIME 90 tablet 1   Multiple Vitamin (MULTIVITAMIN WITH MINERALS) TABS tablet Take 1 tablet by mouth daily.     pantoprazole (PROTONIX) 40 MG tablet TAKE 1  TABLET TWICE DAILY 180 tablet 3   No current facility-administered medications for this visit.    Past Medical History:  Diagnosis Date   Anxiety    Arteriosclerotic cardiovascular disease (ASCVD) 2013   coronary calcification-left main, LAD and CX; small pericardial effusion   Arthritis    hips   Cancer (HCC)    Lung Cancer    Chronic back pain    Chronic bronchitis    Chronic lung disease    Chronic scarring and volume loss-left lung; characteristics of a chronic infectious process-possible MAI   Depression    Dyspnea    occasional    GERD (gastroesophageal reflux disease)    Dr Gala Romney EGD 09/2009->esophagitis, sm HH, antral erosions, atonic esophagus   History of radiation therapy    Left and Right Lung- 11/24/21-12/07/21- Dr. Gery Pray   Hyperlipidemia    Hypertension    Hypothyroid 1981 approx   Scleroderma ([place])    Seasonal allergies    Syncope    Multiple spells over the past 40+ years, likely neurocardiogenic   Tobacco abuse 06/24/2009    Past Surgical History:  Procedure Laterality Date   BILATERAL SALPINGOOPHORECTOMY  2013    Dr. Elonda Husky; uterus remains in situ   BRONCHIAL BIOPSY  11/21/2019   Procedure: BRONCHIAL BIOPSIES;  Surgeon: Collene Gobble, MD;  Location: [facility] ENDOSCOPY;  Service: Pulmonary;;   BRONCHIAL BRUSHINGS  11/21/2019   Procedure: BRONCHIAL BRUSHINGS;  Surgeon: Collene Gobble, MD;  Location[facility] ENDOSCOPY;  Service: Pulmonary;;   BRONCHIAL NEEDLE ASPIRATION BIOPSY  11/21/2019   Procedure: BRONCHIAL NEEDLE ASPIRATION BIOPSIES;  Surgeon: Collene Gobble, MD;  Location: [facility] ENDOSCOPY;  Service: Pulmonary;;   BRONCHIAL WASHINGS  11/21/2019   Procedure: BRONCHIAL WASHINGS;  Surgeon: Collene Gobble, MD;  Location: [facility] ENDOSCOPY;  Service: Pulmonary;;   COLONOSCOPY  09/28/09   anal papilla otherwise normal   COLONOSCOPY N/A 08/29/2012   [id number] polyp-removed as described above. tubular adenoma   COLONOSCOPY WITH PROPOFOL N/A 11/11/2019   Procedure:  COLONOSCOPY WITH PROPOFOL;  Surgeon: Daneil Dolin, MD;  Location: [facility] ENDO SUITE;  Service: Endoscopy;  Laterality: N/A;  2:30pm   ESOPHAGOGASTRODUODENOSCOPY  10/07/09   Dr. Barrie Dunker of esophageal mucosa, diffusely ?esophagitis (bx benign), small HH/antral erosions, erythema bx benign. 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 Hiatal hernia   FUDUCIAL PLACEMENT  10/18/2017   Procedure: PLACEMENT OF 3 FUDUCIAL INTO LEFT LOWER LOBE OF LUNG-TARGET 1;  Surgeon: Collene Gobble, MD;  Location: [place];  Service: Thoracic;;   LAPAROSCOPIC APPENDECTOMY  07/16/2011   Procedure: APPENDECTOMY LAPAROSCOPIC;  Surgeon: Donato Heinz, MD;  Location: [facility] ORS;  Service: General;  Laterality: N/A;   POLYPECTOMY  11/11/2019   Procedure: POLYPECTOMY;  Surgeon: Daneil Dolin, MD;  Location: [facility] ENDO SUITE;  Service: Endoscopy;;   TUBAL LIGATION     VIDEO BRONCHOSCOPY  10/18/2017   VIDEO BRONCHOSCOPY WITH ENDOBRONCHIAL NAVIGATION N/A 10/18/2017   Procedure: VIDEO BRONCHOSCOPY WITH ENDOBRONCHIAL NAVIGATION;  Surgeon: Collene Gobble, MD;  Location: [facility] OR;  Service: Thoracic;  Laterality: N/A;   VIDEO BRONCHOSCOPY WITH ENDOBRONCHIAL NAVIGATION Left 11/21/2019   Procedure: VIDEO BRONCHOSCOPY WITH ENDOBRONCHIAL NAVIGATION;  Surgeon: Collene Gobble, MD;  Location: [facility] ENDOSCOPY;  Service: Pulmonary;  Laterality: Left;    Family History  Problem Relation Age of Onset   Aneurysm Mother    Stroke Father    Aneurysm Father    Hypertension Sister    Cancer Brother        throat cancer   Coronary artery disease Brother    Hypertension Brother    Down syndrome Brother    Hypertension Brother    Hypertension Brother        father/mother   Throat cancer Brother 58       throat cancer   Diabetes Brother    Diabetes Brother     Anesthesia problems Neg Hx    Hypotension Neg Hx    Malignant hyperthermia Neg Hx    Pseudochol deficiency Neg Hx    Colon cancer Neg Hx     Allergies as of 03/29/2022 - Review Complete 03/29/2022  Allergen Reaction Noted   Aspirin Other (See Comments) 05/19/2015    Social History   Socioeconomic History   Marital status: Married    Spouse name: Not on file   Number of children: 2   Years of education: Not on file   Highest education level: Not on file  Occupational History   Occupation: disabled    Employer: NOT EMPLOYED  Tobacco Use   Smoking status: Every Day    Packs/day: 0.50    Years: 45.00    Total pack years: 22.50    Types: Cigarettes    Start date: 07/14/1971   Smokeless tobacco: Never   Tobacco comments:    0.5 packs of cigarettes smoked daily [facility] 12/31/19  Vaping Use   Vaping Use: Never used  Substance and Sexual Activity   Alcohol use: Not Currently    Alcohol/week: 0.0 standard drinks of alcohol    Comment: quit June 2012-used to drink 6 pack daily, occasional beer per pt 11/20/19   Drug use: No   Sexual activity: Yes    Birth control/protection: Post-menopausal  Other Topics Concern   Not on file  Social History Narrative   Not on file   Social Determinants of Health   Financial Resource Strain: Low Risk  (11/25/2020)   Overall Financial Resource Strain (CARDIA)    Difficulty of Paying Living Expenses: Not hard at all  Food Insecurity: No Food Insecurity (11/25/2020)   Hunger Vital Sign    Worried About Running Out of Food in the Last Year: Never true    [place] in the Last Year: Never true  Transportation Needs: No Transportation Needs (11/25/2020)   PRAPARE - Hydrologist (Medical): No    Lack of Transportation (Non-Medical): No  Physical Activity: Insufficiently Active (11/25/2020)   Exercise Vital Sign    Days of Exercise per Week: 3 days    Minutes of Exercise per Session: 30 min  Stress: No Stress  Concern Present (11/25/2020)   [place]  Feeling of Stress : Not at all  Social Connections: Moderately Integrated (11/25/2020)   Social Connection and Isolation Panel [NHANES]    Frequency of Communication with Friends and Family: Three times a week    Frequency of Social Gatherings with Friends and Family: Once a week    Attends Religious Services: More than 4 times per year    Active Member of [employer] or Organizations: No    Attends Archivist Meetings: Never    Marital Status: Married     Review of Systems   Gen: Denies fever, chills, anorexia. Denies fatigue, weakness, weight loss.  CV: Denies chest pain, palpitations, syncope, peripheral edema, and claudication. Resp: Denies dyspnea at rest, cough, wheezing, coughing up blood, and pleurisy. GI: See HPI Derm: Denies rash, itching, dry skin Psych: Denies depression, anxiety, memory loss, confusion. No homicidal or suicidal ideation.  Heme: Denies bruising, bleeding, and enlarged lymph nodes.   Physical Exam   BP 138/86 (BP Location: Left Arm, Patient Position: Sitting, Cuff Size: Normal)   Pulse 98   Temp (!) 97.3 F (36.3 C) (Temporal)   Ht '5\' 9"'$  (1.753 m)   Wt 174 lb 9.6 oz (79.2 kg)   LMP  (LMP Unknown)   BMI 25.78 kg/m   General:   Alert and oriented. No distress noted. Pleasant and cooperative.  Head:  Normocephalic and atraumatic. Eyes:  Conjuctiva clear without scleral icterus. Mouth:  Oral mucosa pink and moist. Good dentition. No lesions. Lungs:  Clear to auscultation bilaterally. No wheezes, rales, or rhonchi. No distress.  Heart:  S1, S2 present without murmurs appreciated.  Abdomen:  +BS, soft, non-tender and non-distended. No rebound or guarding. No HSM or masses noted. Rectal: deferred Msk:  Symmetrical without gross deformities. Normal posture. Extremities:  Without edema. Neurologic:  Alert and  oriented x4 Psych:  Alert and  cooperative. Normal mood and affect.   Assessment  BARNETTA BEVIER is a 67 y.o. female with a history of anxiety, COPD, depression, GERD, HLD, HTN, hypothyroidism, scleroderma chronic lung disease, and history of colonic adenomas presenting today for evaluation of change in stools.  Constipation: Experiencing constipation for the last 6 months. Seeing occasional mucous in her stools and little balls of stool mostly. Has had few "regular" bowel movements. This is associated with urgency. No overt abdominal pain. There is some self limiting episodes of abdominal pain that occur a few times per month for many years. Also has some bloating. Has not used anything otc for treatment. Advised miralax 1 capful daily and begin fiber supplementation.Also gas ex or beano for gas discomfort. If ongoing issues with constipation may consider early interval colonoscopy given change in bowel habits.   GERD: Taking PPI once daily and has occasional breakthrough symptoms. If she forgets to take medication for 2 days she has significant symptoms. Has taken in twice daily at times. Advised daily use of PPI and increase to twice daily if having frequent symptoms.   PLAN   Miralax 17g once daily in 8 oz fluid.  Fiber 2-3 teaspoons daily in 8 oz fluid.  Increase fiber in her diet. Advised to call with progress report in 1 -2 weeks Gas ex or beano as needed.  Continue pantoprazole 40 mg once- twice daily.  Will consider early interval colonoscopy if ongoing issues with constipation or any other alarm symptoms develop.  Follow up in 3 months    Venetia Night, MSN, FNP-BC, AGACNP-BC [facility]

## 2022-03-29 NOTE — Patient Instructions (Addendum)
Continue pantoprazole once to twice daily.   May use gas ex or beano as needed for bloating discomfort.   Start miralax 17 (1 capful) daily. For he first 2 days I wasn't you to take twice daily.  Start benefiber 2-3 teaspoons daily in 8 oz of fluid. You may mix with your miralax.   We will follow up in 8 weeks to see how you are doing. If no good BM in 1-2 weeks please reach out.   It was a pleasure to see you today. I want to create trusting relationships with patients. If you receive a survey regarding your visit,  I greatly appreciate you taking time to fill this out on paper or through your MyChart. I value your feedback.  Venetia Night, MSN, FNP-BC, AGACNP-BC [facility]

## 2022-04-19 ENCOUNTER — Telehealth: Payer: Self-pay | Admitting: *Deleted

## 2022-04-19 NOTE — Telephone Encounter (Signed)
CALLED PATIENT TO INFORM OF CT FOR 04-22-22- ARRIVAL TIME - 1:15 PM @ [place] [facility], NO RESTRICTIONS TO TEST, PATIENT TO RECEIVE RESULTS FROM DR. KINARD ON 04-25-22 @ 11:45 AM, SPOKE WITH PATIENT AND SHE IS AWARE OF THESE APPTS. AND THE INSTRUCTIONS

## 2022-04-22 ENCOUNTER — Ambulatory Visit (HOSPITAL_COMMUNITY): Payer: Medicare HMO

## 2022-04-25 ENCOUNTER — Ambulatory Visit: Payer: Medicare HMO | Admitting: Radiation Oncology

## 2022-05-02 ENCOUNTER — Ambulatory Visit: Payer: Self-pay | Admitting: Radiation Oncology

## 2022-05-09 ENCOUNTER — Other Ambulatory Visit: Payer: Self-pay | Admitting: Internal Medicine

## 2022-05-09 ENCOUNTER — Other Ambulatory Visit: Payer: Self-pay | Admitting: Family Medicine

## 2022-05-09 DIAGNOSIS — Z9109 Other allergy status, other than to drugs and biological substances: Secondary | ICD-10-CM

## 2022-05-09 DIAGNOSIS — I10 Essential (primary) hypertension: Secondary | ICD-10-CM

## 2022-05-30 ENCOUNTER — Encounter: Payer: Self-pay | Admitting: Gastroenterology

## 2022-05-30 ENCOUNTER — Ambulatory Visit: Payer: Medicare HMO | Admitting: Gastroenterology

## 2022-05-30 VITALS — BP 132/69 | HR 90 | Temp 97.7°F | Ht 68.0 in | Wt 168.4 lb

## 2022-05-30 DIAGNOSIS — K5900 Constipation, unspecified: Secondary | ICD-10-CM | POA: Diagnosis not present

## 2022-05-30 DIAGNOSIS — K219 Gastro-esophageal reflux disease without esophagitis: Secondary | ICD-10-CM

## 2022-05-30 NOTE — Progress Notes (Signed)
GI Office Note    Referring Provider: Kerri Perches, MD Primary Care Physician:  Kerri Perches, MD Primary Gastroenterologist: Gerrit Friends.Rourk, MD  Date:  05/30/2022  ID:  Kelly Douglas, DOB [birth year], MRN [id number]   Chief Complaint   Chief Complaint  Patient presents with   Follow-up    Follow up. Not having regular BM. They are coming out like little balls.    History of Present Illness  Kelly Douglas is a 67 y.o. female with a history of COPD, anxiety, depression, GERD, HLD, HTN, hypothyroidism, scleroderma, chronic lung disease, and history of adenomatous colon polyps presenting today for follow-up.  Colonoscopy October 2021: -polyp in the rectum (tubular adenoma) -Sigmoid and descending diverticulosis -Advised repeat in 5 years, notify office if any rectal bleeding, abdominal pain, or significant change in bowel habits.  Last seen in the office 03/29/2022 for change in stools.  Patient reported mucus in her stools with little balls that was ongoing for about 6 months.  Denies any melena or BRBPR.  Reported she did not have a normal bowel movement in 2 weeks and stated that it was a small amount when she did go.  Only having little balls of stool despite her urgency to have a bowel movement.  Also with some periumbilical pain and cramping that occurs about 3 times per month but states this is chronic for the last 40 years.  Denied any nausea or overt vomiting.  Does have some occasional regurgitation of acid with her reflux issues gets her heartburn medication for 2 days.  Mostly takes pantoprazole once daily, sometimes twice daily.  Advised to start MiraLAX 17 g once daily and fiber supplement daily as well as increase fiber in diet.  Advised to use Gas-X or Beano for gas pains.  Continue pantoprazole 40 mg once to twice daily.  Consideration for colonoscopy if no improvement with constipation or if she develops any alarm symptoms.  Today: Constipation - Still  having issues with balls of stool and sometimes having longer pieces. Stools are softer. Is taking miralax about 3 times per week. States years ago she had a colonoscopy prior to 2021 and states she was told there was an issue with her colon but nothing to do about it. No extra fiber supplements but tries to get it in her diet. Right now she is having trouble making food last given the rising cost of things.   No melena or brbpr. Sometimes gets a sharp pain on the left side.   GERD - pantoprazole once per day, sometimes twice daily. Taking famotidine at bedtime.   Current Outpatient Medications  Medication Sig Dispense Refill   acetaminophen (TYLENOL) 650 MG CR tablet Take 1,300 mg by mouth every 8 (eight) hours as needed for pain.      albuterol (VENTOLIN HFA) 108 (90 Base) MCG/ACT inhaler Inhale 1-2 puffs into the lungs every 6 (six) hours as needed for wheezing or shortness of breath. 18 each 1   amLODipine (NORVASC) 5 MG tablet TAKE 1 TABLET EVERY DAY 90 tablet 3   atorvastatin (LIPITOR) 10 MG tablet TAKE 1 TABLET EVERY DAY 90 tablet 3   Cholecalciferol (VITAMIN D) 125 MCG (5000 UT) CAPS Take 5,000 Units by mouth daily.      DULoxetine (CYMBALTA) 60 MG capsule TAKE 1 CAPSULE EVERY DAY 90 capsule 3   famotidine (PEPCID) 20 MG tablet TAKE 1 TABLET EVERY DAY 90 tablet 3   levothyroxine (SYNTHROID) 75 MCG tablet TAKE 1  TABLET EVERY OTHER DAY ALTERNATING WITH 1/2 TABLET EVERY OTHER DAY 66 tablet 3   magnesium oxide (MAG-OX) 400 (240 Mg) MG tablet TAKE 1 TABLET THREE TIMES DAILY 270 tablet 2   meloxicam (MOBIC) 7.5 MG tablet TAKE 1 TABLET EVERY DAY 90 tablet 3   mirtazapine (REMERON) 7.5 MG tablet TAKE 1 TABLET AT BEDTIME 90 tablet 3   montelukast (SINGULAIR) 10 MG tablet TAKE 1 TABLET AT BEDTIME 90 tablet 3   Multiple Vitamin (MULTIVITAMIN WITH MINERALS) TABS tablet Take 1 tablet by mouth daily.     pantoprazole (PROTONIX) 40 MG tablet TAKE 1 TABLET TWICE DAILY 180 tablet 3   No current  facility-administered medications for this visit.    Past Medical History:  Diagnosis Date   Anxiety    Arteriosclerotic cardiovascular disease (ASCVD) 2013   coronary calcification-left main, LAD and CX; small pericardial effusion   Arthritis    hips   Cancer (HCC)    Lung Cancer    Chronic back pain    Chronic bronchitis    Chronic lung disease    Chronic scarring and volume loss-left lung; characteristics of a chronic infectious process-possible MAI   Depression    Dyspnea    occasional    GERD (gastroesophageal reflux disease)    Dr Jena Gauss EGD 09/2009->esophagitis, sm HH, antral erosions, atonic esophagus   History of radiation therapy    Left and Right Lung- 11/24/21-12/07/21- Dr. Antony Blackbird   Hyperlipidemia    Hypertension    Hypothyroid 1981 approx   Scleroderma (HCC)    Seasonal allergies    Syncope    Multiple spells over the past 40+ years, likely neurocardiogenic   Tobacco abuse 06/24/2009    Past Surgical History:  Procedure Laterality Date   BILATERAL SALPINGOOPHORECTOMY  2013    Dr. Despina Hidden; uterus remains in situ   BRONCHIAL BIOPSY  11/21/2019   Procedure: BRONCHIAL BIOPSIES;  Surgeon: Leslye Peer, MD;  Location: [facility] ENDOSCOPY;  Service: Pulmonary;;   BRONCHIAL BRUSHINGS  11/21/2019   Procedure: BRONCHIAL BRUSHINGS;  Surgeon: Leslye Peer, MD;  Location: [facility] ENDOSCOPY;  Service: Pulmonary;;   BRONCHIAL NEEDLE ASPIRATION BIOPSY  11/21/2019   Procedure: BRONCHIAL NEEDLE ASPIRATION BIOPSIES;  Surgeon: Leslye Peer, MD;  Location: [facility] ENDOSCOPY;  Service: Pulmonary;;   BRONCHIAL WASHINGS  11/21/2019   Procedure: BRONCHIAL WASHINGS;  Surgeon: Leslye Peer, MD;  Location: [facility] ENDOSCOPY;  Service: Pulmonary;;   COLONOSCOPY  09/28/09   anal papilla otherwise normal   COLONOSCOPY N/A 08/29/2012   [id number] polyp-removed as described above. tubular adenoma   COLONOSCOPY WITH PROPOFOL N/A 11/11/2019   Procedure: COLONOSCOPY WITH PROPOFOL;  Surgeon: Corbin Ade, MD;  Location: [facility] ENDO SUITE;  Service: Endoscopy;  Laterality: N/A;  2:30pm   ESOPHAGOGASTRODUODENOSCOPY  10/07/09   Dr. Harless Nakayama of esophageal mucosa, diffusely ?esophagitis (bx benign), small HH/antral erosions, erythema bx benign. 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 Hiatal hernia   FUDUCIAL PLACEMENT  10/18/2017   Procedure: PLACEMENT OF 3 FUDUCIAL INTO LEFT LOWER LOBE OF LUNG-TARGET 1;  Surgeon: Leslye Peer, MD;  Location: [facility] OR;  Service: Thoracic;;   LAPAROSCOPIC APPENDECTOMY  07/16/2011   Procedure: APPENDECTOMY LAPAROSCOPIC;  Surgeon: Fabio Bering, MD;  Location: [facility] ORS;  Service: General;  Laterality: N/A;   POLYPECTOMY  11/11/2019   Procedure: POLYPECTOMY;  Surgeon: Corbin Ade, MD;  Location:  [facility] ENDO SUITE;  Service: Endoscopy;;   TUBAL LIGATION     VIDEO BRONCHOSCOPY  10/18/2017   VIDEO BRONCHOSCOPY WITH ENDOBRONCHIAL NAVIGATION N/A 10/18/2017   Procedure: VIDEO BRONCHOSCOPY WITH ENDOBRONCHIAL NAVIGATION;  Surgeon: Leslye Peer, MD;  Location: [facility] OR;  Service: Thoracic;  Laterality: N/A;   VIDEO BRONCHOSCOPY WITH ENDOBRONCHIAL NAVIGATION Left 11/21/2019   Procedure: VIDEO BRONCHOSCOPY WITH ENDOBRONCHIAL NAVIGATION;  Surgeon: Leslye Peer, MD;  Location: [facility] ENDOSCOPY;  Service: Pulmonary;  Laterality: Left;    Family History  Problem Relation Age of Onset   Aneurysm Mother    Stroke Father    Aneurysm Father    Hypertension Sister    Cancer Brother        throat cancer   Coronary artery disease Brother    Hypertension Brother    Down syndrome Brother    Hypertension Brother    Hypertension Brother        father/mother   Throat cancer Brother 49       throat cancer   Diabetes Brother    Diabetes Brother    Anesthesia problems Neg Hx    Hypotension Neg Hx     Malignant hyperthermia Neg Hx    Pseudochol deficiency Neg Hx    Colon cancer Neg Hx     Allergies as of 05/30/2022 - Review Complete 05/30/2022  Allergen Reaction Noted   Aspirin Other (See Comments) 05/19/2015    Social History   Socioeconomic History   Marital status: Married    Spouse name: Not on file   Number of children: 2   Years of education: Not on file   Highest education level: Not on file  Occupational History   Occupation: disabled    Employer: NOT EMPLOYED  Tobacco Use   Smoking status: Every Day    Packs/day: 0.50    Years: 45.00    Additional pack years: 0.00    Total pack years: 22.50    Types: Cigarettes    Start date: 07/14/1971   Smokeless tobacco: Never   Tobacco comments:    0.5 packs of cigarettes smoked daily [facility] 12/31/19  Vaping Use   Vaping Use: Never used  Substance and Sexual Activity   Alcohol use: Not Currently    Alcohol/week: 0.0 standard drinks of alcohol    Comment: quit June 2012-used to drink 6 pack daily, occasional beer per pt 11/20/19   Drug use: No   Sexual activity: Yes    Birth control/protection: Post-menopausal  Other Topics Concern   Not on file  Social History Narrative   Not on file   Social Determinants of Health   Financial Resource Strain: Low Risk  (11/25/2020)   Overall Financial Resource Strain (CARDIA)    Difficulty of Paying Living Expenses: Not hard at all  Food Insecurity: No Food Insecurity (11/25/2020)   Hunger Vital Sign    Worried About Running Out of Food in the Last Year: Never true    Ran Out of Food in the Last Year: Never true  Transportation Needs: No Transportation Needs (11/25/2020)   PRAPARE - Administrator, Civil Service (Medical): No    Lack of Transportation (Non-Medical): No  Physical Activity: Insufficiently Active (11/25/2020)   Exercise Vital Sign    Days of Exercise per Week: 3 days    Minutes of Exercise per Session: 30 min  Stress: No Stress Concern Present  (11/25/2020)   [employer] of Occupational Health - Occupational Stress Questionnaire  Feeling of Stress : Not at all  Social Connections: Moderately Integrated (11/25/2020)   Social Connection and Isolation Panel [NHANES]    Frequency of Communication with Friends and Family: Three times a week    Frequency of Social Gatherings with Friends and Family: Once a week    Attends Religious Services: More than 4 times per year    Active Member of [employer] or Organizations: No    Attends Banker Meetings: Never    Marital Status: Married     Review of Systems   Gen: Denies fever, chills, anorexia. Denies fatigue, weakness, weight loss.  CV: Denies chest pain, palpitations, syncope, peripheral edema, and claudication. Resp: Denies dyspnea at rest, cough, wheezing, coughing up blood, and pleurisy. GI: See HPI Derm: Denies rash, itching, dry skin Psych: Denies depression, anxiety, memory loss, confusion. No homicidal or suicidal ideation.  Heme: Denies bruising, bleeding, and enlarged lymph nodes.  Physical Exam   BP 132/69 (BP Location: Right Arm, Patient Position: Sitting, Cuff Size: Normal)   Pulse 90   Temp 97.7 F (36.5 C) (Temporal)   Ht 5\' 8"  (1.727 m)   Wt 168 lb 6.4 oz (76.4 kg)   LMP  (LMP Unknown)   SpO2 96%   BMI 25.61 kg/m   General:   Alert and oriented. No distress noted. Pleasant and cooperative.  Head:  Normocephalic and atraumatic. Eyes:  Conjuctiva clear without scleral icterus. Mouth:  Oral mucosa pink and moist. Good dentition. No lesions. Lungs:  Clear to auscultation bilaterally. No wheezes, rales, or rhonchi. No distress.  Heart:  S1, S2 present without murmurs appreciated.  Abdomen:  +BS, soft, non-distended.  Mild TTP to periumbilical region and mid lower abdomen.  No rebound or guarding. No HSM or masses noted. Rectal: deferred Msk:  Symmetrical without gross deformities. Normal posture. Extremities:  Without edema. Neurologic:  Alert  and  oriented x4 Psych:  Alert and cooperative. Normal mood and affect.  Assessment  NERVA PALMBERG is a 67 y.o. female with a history of COPD, anxiety, depression, GERD, HLD, HTN, hypothyroidism, scleroderma, chronic lung disease, and history of adenomatous colon polyps presenting today for follow-up.  Constipation: Continuing to experience constipation with little balls of stool in a few small longer stools.  Denies much urgency now.  Has been inconsistent with MiraLAX given she forgets to take daily as she is caring for her husband, usually taking about 3 times per week.  Not taking any extra fiber supplementation currently.  We discussed putting an alarm on her phone or setting up her medication the night before her day begins to ensure she does not forget to take her medication and she has agreed to try this as she knows she needs to care better for herself in order to care for her husband.  We will try this and I also encouraged for her to begin a fiber supplementation if able.  I provided her with some samples of Metamucil today.  GERD: Fairly well-controlled on PPI once daily and famotidine nightly.  Occasionally will take a second dose of PPI as needed.  Will continue with current regimen.  History of colon polyps: Last colonoscopy in October 2021 with tubular adenoma in the rectum.  Due for surveillance in 2026  PLAN   Miralax 17 g once daily, take twice daily tomorrow then resume once daily. Advised daily fiber supplementation such as Benefiber or Metamucil.  Samples of Metamucil provided. Continue pantoprazole 40 mg once daily.  May take an  additional dose if needed. GERD diet Famotidine 20 mg nightly.  Follow-up 3 months    Brooke Bonito, MSN, FNP-BC, AGACNP-BC [facility]

## 2022-05-30 NOTE — Patient Instructions (Addendum)
Continue pantoprazole 40 mg once daily and taking an additional dose in the evening if needed. Continue taking famotidine nightly.  Continue using MiraLAX, try her best to increase this to daily.  You may take 2 doses tomorrow and then continue once daily, mixing this in 8 ounces of fluid of your choice.  Coffee is okay.  As we discussed I feel if you are consistent with MiraLAX your stool consistency should improve.  If you are able to get any fiber supplements this would also benefit you.  You may try Benefiber 2-3 teaspoons daily mixed with your MiraLAX in your coffee or Metamucil 2-3 teaspoons in your water.  Please call let me know how you are doing in a few weeks, if you have no improvement with daily MiraLAX then we can try some prescription management for your constipation.  We will plan to follow-up in 3 months, sooner if needed.  Please do not hesitate to reach out if you have any questions or concerns.  It was a pleasure to see you today. I want to create trusting relationships with patients. If you receive a survey regarding your visit,  I greatly appreciate you taking time to fill this out on paper or through your MyChart. I value your feedback.  Brooke Bonito, MSN, FNP-BC, AGACNP-BC [facility]

## 2022-06-03 ENCOUNTER — Inpatient Hospital Stay: Payer: Medicare HMO | Attending: Hematology

## 2022-06-03 ENCOUNTER — Ambulatory Visit (HOSPITAL_COMMUNITY)
Admission: RE | Admit: 2022-06-03 | Discharge: 2022-06-03 | Disposition: A | Discharge status: Home / Self Care | Payer: Medicare HMO | Source: Ambulatory Visit | Attending: Hematology | Admitting: Hematology

## 2022-06-03 DIAGNOSIS — C3492 Malignant neoplasm of unspecified part of left bronchus or lung: Secondary | ICD-10-CM

## 2022-06-03 DIAGNOSIS — C3432 Malignant neoplasm of lower lobe, left bronchus or lung: Secondary | ICD-10-CM | POA: Insufficient documentation

## 2022-06-03 DIAGNOSIS — F17210 Nicotine dependence, cigarettes, uncomplicated: Secondary | ICD-10-CM | POA: Diagnosis not present

## 2022-06-03 DIAGNOSIS — C3490 Malignant neoplasm of unspecified part of unspecified bronchus or lung: Secondary | ICD-10-CM | POA: Diagnosis not present

## 2022-06-03 DIAGNOSIS — J439 Emphysema, unspecified: Secondary | ICD-10-CM | POA: Diagnosis not present

## 2022-06-03 DIAGNOSIS — D509 Iron deficiency anemia, unspecified: Secondary | ICD-10-CM | POA: Diagnosis not present

## 2022-06-03 DIAGNOSIS — Z801 Family history of malignant neoplasm of trachea, bronchus and lung: Secondary | ICD-10-CM | POA: Insufficient documentation

## 2022-06-03 DIAGNOSIS — R918 Other nonspecific abnormal finding of lung field: Secondary | ICD-10-CM | POA: Diagnosis not present

## 2022-06-03 LAB — IRON AND TIBC
Iron: 65 ug/dL (ref 28–170)
Saturation Ratios: 15 % (ref 10.4–31.8)
TIBC: 437 ug/dL (ref 250–450)
UIBC: 372 ug/dL

## 2022-06-03 LAB — CBC WITH DIFFERENTIAL/PLATELET
Abs Immature Granulocytes: 0.03 10*3/uL (ref 0.00–0.07)
Basophils Absolute: 0 10*3/uL (ref 0.0–0.1)
Basophils Relative: 0 %
Eosinophils Absolute: 0.1 10*3/uL (ref 0.0–0.5)
Eosinophils Relative: 1 %
HCT: 38.6 % (ref 36.0–46.0)
Hemoglobin: 12.1 g/dL (ref 12.0–15.0)
Immature Granulocytes: 0 %
Lymphocytes Relative: 35 %
Lymphs Abs: 3.5 10*3/uL (ref 0.7–4.0)
MCH: 24.6 pg — ABNORMAL LOW (ref 26.0–34.0)
MCHC: 31.3 g/dL (ref 30.0–36.0)
MCV: 78.6 fL — ABNORMAL LOW (ref 80.0–100.0)
Monocytes Absolute: 1.1 10*3/uL — ABNORMAL HIGH (ref 0.1–1.0)
Monocytes Relative: 11 %
Neutro Abs: 5.2 10*3/uL (ref 1.7–7.7)
Neutrophils Relative %: 53 %
Platelets: 374 10*3/uL (ref 150–400)
RBC: 4.91 MIL/uL (ref 3.87–5.11)
RDW: 16.6 % — ABNORMAL HIGH (ref 11.5–15.5)
WBC: 9.9 10*3/uL (ref 4.0–10.5)
nRBC: 0 % (ref 0.0–0.2)

## 2022-06-03 LAB — COMPREHENSIVE METABOLIC PANEL
ALT: 23 U/L (ref 0–44)
AST: 28 U/L (ref 15–41)
Albumin: 4.2 g/dL (ref 3.5–5.0)
Alkaline Phosphatase: 127 U/L — ABNORMAL HIGH (ref 38–126)
Anion gap: 11 (ref 5–15)
BUN: 9 mg/dL (ref 8–23)
CO2: 23 mmol/L (ref 22–32)
Calcium: 9.3 mg/dL (ref 8.9–10.3)
Chloride: 102 mmol/L (ref 98–111)
Creatinine, Ser: 1.01 mg/dL — ABNORMAL HIGH (ref 0.44–1.00)
GFR, Estimated: >60 mL/min (ref >60)
Glucose, Bld: 101 mg/dL — ABNORMAL HIGH (ref 70–99)
Potassium: 4 mmol/L (ref 3.5–5.1)
Sodium: 136 mmol/L (ref 135–145)
Total Bilirubin: 0.6 mg/dL (ref 0.3–1.2)
Total Protein: 7.7 g/dL (ref 6.5–8.1)

## 2022-06-03 LAB — FERRITIN: Ferritin: 14 ng/mL (ref 11–307)

## 2022-06-09 ENCOUNTER — Encounter: Payer: Self-pay | Admitting: Family Medicine

## 2022-06-09 ENCOUNTER — Ambulatory Visit (INDEPENDENT_AMBULATORY_CARE_PROVIDER_SITE_OTHER): Payer: Medicare HMO | Admitting: Family Medicine

## 2022-06-09 ENCOUNTER — Other Ambulatory Visit: Payer: Medicare HMO

## 2022-06-09 VITALS — BP 138/80 | HR 70 | Ht 68.0 in | Wt 166.1 lb

## 2022-06-09 DIAGNOSIS — F32A Depression, unspecified: Secondary | ICD-10-CM

## 2022-06-09 DIAGNOSIS — F419 Anxiety disorder, unspecified: Secondary | ICD-10-CM

## 2022-06-09 DIAGNOSIS — E559 Vitamin D deficiency, unspecified: Secondary | ICD-10-CM | POA: Diagnosis not present

## 2022-06-09 DIAGNOSIS — C3492 Malignant neoplasm of unspecified part of left bronchus or lung: Secondary | ICD-10-CM | POA: Diagnosis not present

## 2022-06-09 DIAGNOSIS — E038 Other specified hypothyroidism: Secondary | ICD-10-CM

## 2022-06-09 DIAGNOSIS — E782 Mixed hyperlipidemia: Secondary | ICD-10-CM

## 2022-06-09 DIAGNOSIS — I10 Essential (primary) hypertension: Secondary | ICD-10-CM

## 2022-06-09 DIAGNOSIS — J449 Chronic obstructive pulmonary disease, unspecified: Secondary | ICD-10-CM

## 2022-06-09 DIAGNOSIS — Z139 Encounter for screening, unspecified: Secondary | ICD-10-CM

## 2022-06-09 DIAGNOSIS — F17218 Nicotine dependence, cigarettes, with other nicotine-induced disorders: Secondary | ICD-10-CM | POA: Diagnosis not present

## 2022-06-09 DIAGNOSIS — Z6379 Other stressful life events affecting family and household: Secondary | ICD-10-CM

## 2022-06-09 MED ORDER — MAGNESIUM OXIDE -MG SUPPLEMENT 400 (240 MG) MG PO TABS: ORAL_TABLET | ORAL | 2 refills | Status: DC

## 2022-06-09 NOTE — Patient Instructions (Addendum)
Annual exam 09/25/2022 or after   Labs today, chem 7 and EGFR, magnesium, TSH and vit D  and hep C screen( takes 5000 IU daily)  Mammogram to be scheduled  at checkout   Meds are to remain the  same  Please continue to work on smoking cessation  Thanks for choosing [facility], we consider it a privelige to serve you.

## 2022-06-10 LAB — BMP8+EGFR
BUN/Creatinine Ratio: 10 — ABNORMAL LOW (ref 12–28)
BUN: 11 mg/dL (ref 8–27)
CO2: 23 mmol/L (ref 20–29)
Calcium: 10.4 mg/dL — ABNORMAL HIGH (ref 8.7–10.3)
Chloride: 102 mmol/L (ref 96–106)
Creatinine, Ser: 1.13 mg/dL — ABNORMAL HIGH (ref 0.57–1.00)
Glucose: 81 mg/dL (ref 70–99)
Potassium: 4.7 mmol/L (ref 3.5–5.2)
Sodium: 140 mmol/L (ref 134–144)
eGFR: 54 mL/min/{1.73_m2} — ABNORMAL LOW (ref >59)

## 2022-06-10 LAB — HEPATITIS C ANTIBODY: Hep C Virus Ab: NONREACTIVE

## 2022-06-10 LAB — MAGNESIUM: Magnesium: 2.2 mg/dL (ref 1.6–2.3)

## 2022-06-10 LAB — VITAMIN D 25 HYDROXY (VIT D DEFICIENCY, FRACTURES): Vit D, 25-Hydroxy: 48.4 ng/mL (ref 30.0–100.0)

## 2022-06-10 LAB — TSH: TSH: 2.03 u[IU]/mL (ref 0.450–4.500)

## 2022-06-12 ENCOUNTER — Encounter: Payer: Self-pay | Admitting: Family Medicine

## 2022-06-12 DIAGNOSIS — Z139 Encounter for screening, unspecified: Secondary | ICD-10-CM | POA: Insufficient documentation

## 2022-06-12 DIAGNOSIS — Z6379 Other stressful life events affecting family and household: Secondary | ICD-10-CM | POA: Insufficient documentation

## 2022-06-12 NOTE — Assessment & Plan Note (Signed)
Reports worsening dementia in spouse, overall coping fairly well

## 2022-06-12 NOTE — Progress Notes (Signed)
Kelly Douglas     MRN: [id number]      DOB: 05-01-1955  Chief Complaint  Patient presents with   Follow-up    Follow up stress husband has dementia    HPI Kelly Douglas is here for follow up and re-evaluation of chronic medical conditions, medication management and review of any available recent lab and radiology data.  Preventive health is updated, specifically  Cancer screening and Immunization.   Questions or concerns regarding consultations or procedures which the PT has had in the interim are  addressed. The PT denies any adverse reactions to current medications since the last visit.  There are no new concerns.  There are no specific complaints   ROS Denies recent fever or chills. Denies sinus pressure, nasal congestion, ear pain or sore throat. Denies chest congestion, productive cough or wheezing. Denies chest pains, palpitations and leg swelling Denies abdominal pain, nausea, vomiting,diarrhea or constipation.   Denies dysuria, frequency, hesitancy or incontinence. Denies joint pain, swelling and limitation in mobility. Denies headaches, seizures, numbness, or tingling. Denies depression, anxiety or insomnia. Denies skin break down or rash.   PE  BP 138/80 (BP Location: Right Arm, Patient Position: Sitting, Cuff Size: Normal)   Pulse 70   Ht 5\' 8"  (1.727 m)   Wt 166 lb 1.9 oz (75.4 kg)   LMP  (LMP Unknown)   SpO2 95%   BMI 25.26 kg/m   Patient alert and oriented and in no cardiopulmonary distress.  HEENT: No facial asymmetry, EOMI,     Neck supple .  Chest: Clear to auscultation bilaterally.  CVS: S1, S2 no murmurs, no S3.Regular rate.  ABD: Soft non tender.   Ext: No edema  MS: Adequate ROM spine, shoulders, hips and knees.  Skin: Intact, no ulcerations or rash noted.  Psych: Good eye contact, normal affect. Memory intact not anxious or depressed appearing.  CNS: CN 2-12 intact, power,  normal throughout.no focal deficits noted.   Assessment &  Plan  Anxiety and depression Coping with home situation better, improved control of depression and anxiety Continue medication  COPD (chronic obstructive pulmonary disease) (HCC) Deteriorating due to ongoing nicotine use  HTN (hypertension) Controlled, no change in medication DASH diet and commitment to daily physical activity for a minimum of 30 minutes discussed and encouraged, as a part of hypertension management. The importance of attaining a healthy weight is also discussed.     06/09/2022    1:06 PM 05/30/2022    3:04 PM 03/29/2022    2:45 PM 03/17/2022    2:57 PM 02/03/2022    1:11 PM 01/27/2022    4:01 PM 01/19/2022    3:35 AM  BP/Weight  Systolic BP 138 132 138 134 134 137 149  Diastolic BP 80 69 86 86 84 71 87  Wt. (Lbs) 166.12 168.4 174.6 172.7 171.12 173.8   BMI 25.26 kg/m2 25.61 kg/m2 25.78 kg/m2 26.26 kg/m2 26.02 kg/m2 25.67 kg/m2        Hypothyroidism Controlled, no change in medication   Hypomagnesemia corrected  Hyperlipidemia Hyperlipidemia:Low fat diet discussed and encouraged.   Lipid Panel  Lab Results  Component Value Date   CHOL 140 02/03/2022   HDL 40 02/03/2022   LDLCALC 72 02/03/2022   TRIG 163 (H) 02/03/2022   CHOLHDL 3.5 02/03/2022     Updated lab needed at/ before next visit.   Adenocarcinoma of left lung, stage 1 (HCC) Upcoming appt with Oncology,positive response to radiation noted  Nicotine dependence Asked:confirms currently  smokes cigarettes Assess: Unwilling to set a quit date, but is cutting back Advise: needs to QUIT to reduce risk of cancer, cardio and cerebrovascular disease Assist: counseled for 5 minutes and literature provided Arrange: follow up in 2 to 4 months   Stress due to illness of family member Reports worsening dementia in spouse, overall coping fairly well

## 2022-06-12 NOTE — Assessment & Plan Note (Signed)
Upcoming appt with Oncology,positive response to radiation noted

## 2022-06-12 NOTE — Assessment & Plan Note (Signed)
Deteriorating due to ongoing nicotine use ?

## 2022-06-12 NOTE — Assessment & Plan Note (Signed)
Controlled, no change in medication  

## 2022-06-12 NOTE — Assessment & Plan Note (Signed)
Asked:confirms currently smokes cigarettes °Assess: Unwilling to set a quit date, but is cutting back °Advise: needs to QUIT to reduce risk of cancer, cardio and cerebrovascular disease °Assist: counseled for 5 minutes and literature provided °Arrange: follow up in 2 to 4 months ° °

## 2022-06-12 NOTE — Assessment & Plan Note (Signed)
Controlled, no change in medication DASH diet and commitment to daily physical activity for a minimum of 30 minutes discussed and encouraged, as a part of hypertension management. The importance of attaining a healthy weight is also discussed.     06/09/2022    1:06 PM 05/30/2022    3:04 PM 03/29/2022    2:45 PM 03/17/2022    2:57 PM 02/03/2022    1:11 PM 01/27/2022    4:01 PM 01/19/2022    3:35 AM  BP/Weight  Systolic BP 138 132 138 134 134 137 149  Diastolic BP 80 69 86 86 84 71 87  Wt. (Lbs) 166.12 168.4 174.6 172.7 171.12 173.8   BMI 25.26 kg/m2 25.61 kg/m2 25.78 kg/m2 26.26 kg/m2 26.02 kg/m2 25.67 kg/m2

## 2022-06-12 NOTE — Assessment & Plan Note (Signed)
Hyperlipidemia:Low fat diet discussed and encouraged.   Lipid Panel  Lab Results  Component Value Date   CHOL 140 02/03/2022   HDL 40 02/03/2022   LDLCALC 72 02/03/2022   TRIG 163 (H) 02/03/2022   CHOLHDL 3.5 02/03/2022     Updated lab needed at/ before next visit.

## 2022-06-12 NOTE — Assessment & Plan Note (Signed)
Coping with home situation better, improved control of depression and anxiety Continue medication

## 2022-06-12 NOTE — Assessment & Plan Note (Signed)
corrected

## 2022-06-15 ENCOUNTER — Inpatient Hospital Stay (HOSPITAL_BASED_OUTPATIENT_CLINIC_OR_DEPARTMENT_OTHER): Payer: Medicare HMO | Admitting: Hematology

## 2022-06-15 VITALS — BP 143/86 | HR 96 | Temp 99.4°F | Resp 18 | Ht 68.0 in | Wt 167.5 lb

## 2022-06-15 DIAGNOSIS — C3432 Malignant neoplasm of lower lobe, left bronchus or lung: Secondary | ICD-10-CM | POA: Diagnosis not present

## 2022-06-15 DIAGNOSIS — D509 Iron deficiency anemia, unspecified: Secondary | ICD-10-CM | POA: Diagnosis not present

## 2022-06-15 DIAGNOSIS — F17210 Nicotine dependence, cigarettes, uncomplicated: Secondary | ICD-10-CM | POA: Diagnosis not present

## 2022-06-15 DIAGNOSIS — C3492 Malignant neoplasm of unspecified part of left bronchus or lung: Secondary | ICD-10-CM

## 2022-06-15 DIAGNOSIS — Z801 Family history of malignant neoplasm of trachea, bronchus and lung: Secondary | ICD-10-CM | POA: Diagnosis not present

## 2022-06-15 NOTE — Progress Notes (Signed)
[facility] [address] [place] [address]    Clinic Day:  06/15/2022  Referring physician: Kerri Perches, MD  Patient Care Team: Kerri Perches, MD as PCP - General Rothbart, Gerrit Friends, MD (Inactive) (Cardiology) Jena Gauss Gerrit Friends, MD (Gastroenterology) Lurena Nida, PTA as Physical Therapy Assistant (Physical Therapy) Newman Pies, MD as Consulting Physician (Otolaryngology)   ASSESSMENT & PLAN:   Assessment: 1.  Adenocarcinoma of left lower lobe of lung: -She is followed by Dr. Delton Coombes and left lower lobe lung nodule was being watched closely for the last few years. -She had increase in size of the left lower lobe lung nodule recently on CT scan. -PET scan on 12/09/2019 showed 2 cm left lower lobe pulmonary nodule with some solid elements with maximum SUV 4.7.  No adenopathy reported. -Bronchoscopy and biopsy of the left lower lobe nodule consistent with adenocarcinoma.  Left lower lobe brushings also positive for adenocarcinoma.  Left upper lobe brushings did not show any malignant cells. - XRT to the left and right lungs lesions from 11/24/2021 through 12/07/2021   2.  Social/family history: - Current active smoker, half pack per day since age 66. -Lives with husband and brother at home.  Activity is limited secondary to back and hip pain. -Brother had throat cancer and paternal cousin had throat cancer.    Plan: 1.  Clinical stage I adenocarcinoma of left lower lobe of lung: - She reports energy levels improved since last visit. - Continuing to smoke half pack of cigarettes per day.  She is also taking care of her husband who has dementia.  Hence she is unable to stop smoking. - CT chest (06/03/2022): Mixed findings with the some lymph nodes decreased in size and some lung nodules increased in size only by 2 to 3 mm. - Recommend follow-up in 4 months with repeat CT chest without contrast.   2.  Microcytic anemia: - Hemoglobin is 12.  Ferritin  low at 14. - Recommend start iron tablet daily.  Will repeat iron panel at next visit.    Orders Placed This Encounter  Procedures   CT Chest Wo Contrast    Standing Status:   Future    Standing Expiration Date:   06/15/2023    Order Specific Question:   Preferred imaging location?    Answer:   [facility]    Order Specific Question:   Release to patient    Answer:   Immediate [1]   CBC with Differential/Platelet    Standing Status:   Future    Standing Expiration Date:   06/15/2023    Order Specific Question:   Release to patient    Answer:   Immediate   Ferritin    Standing Status:   Future    Standing Expiration Date:   06/15/2023    Order Specific Question:   Release to patient    Answer:   Immediate   Iron and TIBC    Standing Status:   Future    Standing Expiration Date:   06/15/2023    Order Specific Question:   Release to patient    Answer:   Immediate      I,Katie Daubenspeck,acting as a scribe for Doreatha Massed, MD.,have documented all relevant documentation on the behalf of Doreatha Massed, MD,as directed by  Doreatha Massed, MD while in the presence of Doreatha Massed, MD.   I, Doreatha Massed MD, have reviewed the above documentation for accuracy and completeness,  and I agree with the above.   Doreatha Massed, MD   5/29/20245:17 PM  CHIEF COMPLAINT:   Diagnosis: left lung cancer    Cancer Staging  No matching staging information was found for the patient.   Prior Therapy: XRT to bilateral lung lesions, 11/24/21 - 12/07/21  Current Therapy:  surveillance   HISTORY OF PRESENT ILLNESS:   Oncology History   No history exists.     INTERVAL HISTORY:   Kelly Douglas is a 67 y.o. female presenting to clinic today for follow up of left lung cancer. She was last seen by me on 03/17/22.  Since her last visit, she underwent surveillance chest CT on 06/03/22 showing: increasing parenchymal opacities and nodularity surrounding area of  central LLL nodule; additional areas of reticulonodular changes increasing in LUL; slow interval increase in size of small spiculated RUL nodule, other right lung nodules are stable; slight interval increase in size of abnormal lymph node in midline, anterior to esophagus, no additional lymph node enlargement.  Today, she states that she is doing well overall. Her appetite level is at 100%. Her energy level is at 60%.  PAST MEDICAL HISTORY:   Past Medical History: Past Medical History:  Diagnosis Date   Anxiety    Arteriosclerotic cardiovascular disease (ASCVD) 2013   coronary calcification-left main, LAD and CX; small pericardial effusion   Arthritis    hips   Cancer (HCC)    Lung Cancer    Chronic back pain    Chronic bronchitis    Chronic lung disease    Chronic scarring and volume loss-left lung; characteristics of a chronic infectious process-possible MAI   Depression    Dyspnea    occasional    GERD (gastroesophageal reflux disease)    Dr Jena Gauss EGD 09/2009->esophagitis, sm HH, antral erosions, atonic esophagus   History of radiation therapy    Left and Right Lung- 11/24/21-12/07/21- Dr. Antony Blackbird   Hyperlipidemia    Hypertension    Hypothyroid 1981 approx   Scleroderma (HCC)    Seasonal allergies    Syncope    Multiple spells over the past 40+ years, likely neurocardiogenic   Tobacco abuse 06/24/2009    Surgical History: Past Surgical History:  Procedure Laterality Date   BILATERAL SALPINGOOPHORECTOMY  2013    Dr. Despina Hidden; uterus remains in situ   BRONCHIAL BIOPSY  11/21/2019   Procedure: BRONCHIAL BIOPSIES;  Surgeon: Leslye Peer, MD;  Location: [facility] ENDOSCOPY;  Service: Pulmonary;;   BRONCHIAL BRUSHINGS  11/21/2019   Procedure: BRONCHIAL BRUSHINGS;  Surgeon: Leslye Peer, MD;  Location: [facility] ENDOSCOPY;  Service: Pulmonary;;   BRONCHIAL NEEDLE ASPIRATION BIOPSY  11/21/2019   Procedure: BRONCHIAL NEEDLE ASPIRATION BIOPSIES;  Surgeon: Leslye Peer, MD;  Location[facility]  [facility] ENDOSCOPY;  Service: Pulmonary;;   BRONCHIAL WASHINGS  11/21/2019   Procedure: BRONCHIAL WASHINGS;  Surgeon: Leslye Peer, MD;  Location: [facility] ENDOSCOPY;  Service: Pulmonary;;   COLONOSCOPY  09/28/09   anal papilla otherwise normal   COLONOSCOPY N/A 08/29/2012   WUJ:WJXBJYN polyp-removed as described above. tubular adenoma   COLONOSCOPY WITH PROPOFOL N/A 11/11/2019   Procedure: COLONOSCOPY WITH PROPOFOL;  Surgeon: Corbin Ade, MD;  Location: [facility] ENDO SUITE;  Service: Endoscopy;  Laterality: N/A;  2:30pm   ESOPHAGOGASTRODUODENOSCOPY  10/07/09   Dr. Harless Nakayama of esophageal mucosa, diffusely ?esophagitis (bx benign), small HH/antral erosions, erythema bx benign. 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 Hiatal hernia   FUDUCIAL PLACEMENT  10/18/2017   Procedure: PLACEMENT OF 3 FUDUCIAL INTO LEFT LOWER LOBE OF LUNG-TARGET 1;  Surgeon: Leslye Peer, MD;  Location: [facility] OR;  Service: Thoracic;;   LAPAROSCOPIC APPENDECTOMY  07/16/2011   Procedure: APPENDECTOMY LAPAROSCOPIC;  Surgeon: Fabio Bering, MD;  Location: [facility] ORS;  Service: General;  Laterality: N/A;   POLYPECTOMY  11/11/2019   Procedure: POLYPECTOMY;  Surgeon: Corbin Ade, MD;  Location: [facility] ENDO SUITE;  Service: Endoscopy;;   TUBAL LIGATION     VIDEO BRONCHOSCOPY  10/18/2017   VIDEO BRONCHOSCOPY WITH ENDOBRONCHIAL NAVIGATION N/A 10/18/2017   Procedure: VIDEO BRONCHOSCOPY WITH ENDOBRONCHIAL NAVIGATION;  Surgeon: Leslye Peer, MD;  Location: [facility] OR;  Service: Thoracic;  Laterality: N/A;   VIDEO BRONCHOSCOPY WITH ENDOBRONCHIAL NAVIGATION Left 11/21/2019   Procedure: VIDEO BRONCHOSCOPY WITH ENDOBRONCHIAL NAVIGATION;  Surgeon: Leslye Peer, MD;  Location: [facility] ENDOSCOPY;  Service: Pulmonary;  Laterality: Left;    Social History: Social History    Socioeconomic History   Marital status: Married    Spouse name: Not on file   Number of children: 2   Years of education: Not on file   Highest education level: Not on file  Occupational History   Occupation: disabled    Employer: NOT EMPLOYED  Tobacco Use   Smoking status: Every Day    Packs/day: 0.50    Years: 45.00    Additional pack years: 0.00    Total pack years: 22.50    Types: Cigarettes    Start date: 07/14/1971   Smokeless tobacco: Never   Tobacco comments:    0.5 packs of cigarettes smoked daily ARJ 12/31/19  Vaping Use   Vaping Use: Never used  Substance and Sexual Activity   Alcohol use: Not Currently    Alcohol/week: 0.0 standard drinks of alcohol    Comment: quit June 2012-used to drink 6 pack daily, occasional beer per pt 11/20/19   Drug use: No   Sexual activity: Yes    Birth control/protection: Post-menopausal  Other Topics Concern   Not on file  Social History Narrative   Not on file   Social Determinants of Health   Financial Resource Strain: Low Risk  (11/25/2020)   Overall Financial Resource Strain (CARDIA)    Difficulty of Paying Living Expenses: Not hard at all  Food Insecurity: No Food Insecurity (11/25/2020)   Hunger Vital Sign    Worried About Running Out of Food in the Last Year: Never true    Ran Out of Food in the Last Year: Never true  Transportation Needs: No Transportation Needs (11/25/2020)   PRAPARE - Administrator, Civil Service (Medical): No    Lack of Transportation (Non-Medical): No  Physical Activity: Insufficiently Active (11/25/2020)   Exercise Vital Sign    Days of Exercise per Week: 3 days    Minutes of Exercise per Session: 30 min  Stress: No Stress Concern Present (11/25/2020)   [employer] of Occupational Health - Occupational Stress Questionnaire    Feeling of Stress : Not at all  Social Connections: Moderately Integrated (11/25/2020)   Social Connection and Isolation Panel [NHANES]    Frequency of  Communication with Friends and Family: Three times a week    Frequency of Social Gatherings with Friends and Family: Once a week    Attends Religious Services: More than 4 times per year    Active Member of Clubs or Organizations: No    Attends  Club or Organization Meetings: Never    Marital Status: Married  Catering manager Violence: Not At Risk (11/25/2020)   Humiliation, Afraid, Rape, and Kick questionnaire    Fear of Current or Ex-Partner: No    Emotionally Abused: No    Physically Abused: No    Sexually Abused: No    Family History: Family History  Problem Relation Age of Onset   Aneurysm Mother    Stroke Father    Aneurysm Father    Hypertension Sister    Cancer Brother        throat cancer   Coronary artery disease Brother    Hypertension Brother    Down syndrome Brother    Hypertension Brother    Hypertension Brother        father/mother   Throat cancer Brother 49       throat cancer   Diabetes Brother    Diabetes Brother    Anesthesia problems Neg Hx    Hypotension Neg Hx    Malignant hyperthermia Neg Hx    Pseudochol deficiency Neg Hx    Colon cancer Neg Hx     Current Medications:  Current Outpatient Medications:    acetaminophen (TYLENOL) 650 MG CR tablet, Take 1,300 mg by mouth every 8 (eight) hours as needed for pain. , Disp: , Rfl:    albuterol (VENTOLIN HFA) 108 (90 Base) MCG/ACT inhaler, Inhale 1-2 puffs into the lungs every 6 (six) hours as needed for wheezing or shortness of breath., Disp: 18 each, Rfl: 1   amLODipine (NORVASC) 5 MG tablet, TAKE 1 TABLET EVERY DAY, Disp: 90 tablet, Rfl: 3   atorvastatin (LIPITOR) 10 MG tablet, TAKE 1 TABLET EVERY DAY, Disp: 90 tablet, Rfl: 3   Cholecalciferol (VITAMIN D) 125 MCG (5000 UT) CAPS, Take 5,000 Units by mouth daily. , Disp: , Rfl:    DULoxetine (CYMBALTA) 60 MG capsule, TAKE 1 CAPSULE EVERY DAY, Disp: 90 capsule, Rfl: 3   famotidine (PEPCID) 20 MG tablet, TAKE 1 TABLET EVERY DAY, Disp: 90 tablet, Rfl: 3    levothyroxine (SYNTHROID) 75 MCG tablet, TAKE 1 TABLET EVERY OTHER DAY ALTERNATING WITH 1/2 TABLET EVERY OTHER DAY, Disp: 66 tablet, Rfl: 3   magnesium oxide (MAG-OX) 400 (240 Mg) MG tablet, TAKE 1 TABLET THREE TIMES DAILY, Disp: 270 tablet, Rfl: 2   meloxicam (MOBIC) 7.5 MG tablet, TAKE 1 TABLET EVERY DAY, Disp: 90 tablet, Rfl: 3   mirtazapine (REMERON) 7.5 MG tablet, TAKE 1 TABLET AT BEDTIME, Disp: 90 tablet, Rfl: 3   montelukast (SINGULAIR) 10 MG tablet, TAKE 1 TABLET AT BEDTIME, Disp: 90 tablet, Rfl: 3   Multiple Vitamin (MULTIVITAMIN WITH MINERALS) TABS tablet, Take 1 tablet by mouth daily., Disp: , Rfl:    pantoprazole (PROTONIX) 40 MG tablet, TAKE 1 TABLET TWICE DAILY, Disp: 180 tablet, Rfl: 3   Allergies: Allergies  Allergen Reactions   Aspirin Other (See Comments)    Nose bleeds    REVIEW OF SYSTEMS:   Review of Systems  Constitutional:  Negative for chills, fatigue and fever.  HENT:   Negative for lump/mass, mouth sores, nosebleeds, sore throat and trouble swallowing.   Eyes:  Negative for eye problems.  Respiratory:  Positive for cough and shortness of breath.   Cardiovascular:  Negative for chest pain, leg swelling and palpitations.  Gastrointestinal:  Negative for abdominal pain, constipation, diarrhea, nausea and vomiting.  Genitourinary:  Negative for bladder incontinence, difficulty urinating, dysuria, frequency, hematuria and nocturia.   Musculoskeletal:  Negative for  arthralgias, back pain, flank pain, myalgias and neck pain.  Skin:  Negative for itching and rash.  Neurological:  Positive for headaches. Negative for dizziness and numbness.  Hematological:  Does not bruise/bleed easily.  Psychiatric/Behavioral:  Positive for sleep disturbance. Negative for depression and suicidal ideas. The patient is not nervous/anxious.   All other systems reviewed and are negative.    VITALS:   Blood pressure (!) 143/86, pulse 96, temperature 99.4 F (37.4 C), temperature  source Oral, resp. rate 18, height 5\' 8"  (1.727 m), weight 167 lb 8 oz (76 kg), SpO2 97 %.  Wt Readings from Last 3 Encounters:  06/15/22 167 lb 8 oz (76 kg)  06/09/22 166 lb 1.9 oz (75.4 kg)  05/30/22 168 lb 6.4 oz (76.4 kg)    Body mass index is 25.47 kg/m.  Performance status (ECOG): 1 - Symptomatic but completely ambulatory  PHYSICAL EXAM:   Physical Exam Vitals and nursing note reviewed. Exam conducted with a chaperone present.  Constitutional:      Appearance: Normal appearance.  Cardiovascular:     Rate and Rhythm: Normal rate and regular rhythm.     Pulses: Normal pulses.     Heart sounds: Normal heart sounds.  Pulmonary:     Effort: Pulmonary effort is normal.     Breath sounds: Normal breath sounds.  Abdominal:     Palpations: Abdomen is soft. There is no hepatomegaly, splenomegaly or mass.     Tenderness: There is no abdominal tenderness.  Musculoskeletal:     Right lower leg: No edema.     Left lower leg: No edema.  Lymphadenopathy:     Cervical: No cervical adenopathy.     Right cervical: No superficial, deep or posterior cervical adenopathy.    Left cervical: No superficial, deep or posterior cervical adenopathy.     Upper Body:     Right upper body: No supraclavicular or axillary adenopathy.     Left upper body: No supraclavicular or axillary adenopathy.  Neurological:     General: No focal deficit present.     Mental Status: She is alert and oriented to person, place, and time.  Psychiatric:        Mood and Affect: Mood normal.        Behavior: Behavior normal.     LABS:      Latest Ref Rng & Units 06/03/2022    2:08 PM 03/03/2022    4:11 PM 01/18/2022    9:53 PM  CBC  WBC 4.0 - 10.5 K/uL 9.9  11.4  10.1   Hemoglobin 12.0 - 15.0 g/dL 16.1  09.6  04.5   Hematocrit 36.0 - 46.0 % 38.6  37.5  42.5   Platelets 150 - 400 K/uL 374  285  393       Latest Ref Rng & Units 06/09/2022    1:55 PM 06/03/2022    2:08 PM 03/03/2022    4:11 PM  CMP  Glucose 70  - 99 mg/dL 81  409  811   BUN 8 - 27 mg/dL 11  9  7    Creatinine 0.57 - 1.00 mg/dL 9.14  7.82  9.56   Sodium 134 - 144 mmol/L 140  136  133   Potassium 3.5 - 5.2 mmol/L 4.7  4.0  3.7   Chloride 96 - 106 mmol/L 102  102  101   CO2 20 - 29 mmol/L 23  23  23    Calcium 8.7 - 10.3 mg/dL 21.3  9.3  9.0   Total Protein 6.5 - 8.1 g/dL  7.7  7.6   Total Bilirubin 0.3 - 1.2 mg/dL  0.6  0.5   Alkaline Phos 38 - 126 U/L  127  129   AST 15 - 41 U/L  28  30   ALT 0 - 44 U/L  23  27      No results found for: "CEA1", "CEA" / No results found for: "CEA1", "CEA" No results found for: "PSA1" No results found for: "WUJ811" No results found for: "CAN125"  No results found for: "TOTALPROTELP", "ALBUMINELP", "A1GS", "A2GS", "BETS", "BETA2SER", "GAMS", "MSPIKE", "SPEI" Lab Results  Component Value Date   TIBC 437 06/03/2022   TIBC 398 02/01/2013   TIBC 377 04/25/2012   FERRITIN 14 06/03/2022   FERRITIN 30 05/03/2013   FERRITIN 22 02/01/2013   IRONPCTSAT 15 06/03/2022   IRONPCTSAT 27 02/01/2013   IRONPCTSAT 16 (L) 04/25/2012   No results found for: "LDH"   STUDIES:   CT CHEST WO CONTRAST  Result Date: 06/07/2022 CLINICAL DATA:  Monitoring non-small-cell lung cancer. Adenocarcinoma left lung. Status post radiation. * Tracking Code: BO * EXAM: CT CHEST WITHOUT CONTRAST TECHNIQUE: Multidetector CT imaging of the chest was performed following the standard protocol without IV contrast. RADIATION DOSE REDUCTION: This exam was performed according to the departmental dose-optimization program which includes automated exposure control, adjustment of the mA and/or kV according to patient size and/or use of iterative reconstruction technique. COMPARISON:  CT 03/04/2022 FINDINGS: Cardiovascular: Shift of the mediastinal structures from right-to-left. Small pericardial effusion. The heart nonenlarged. On this non IV contrast exam the thoracic aorta overall has a normal course and caliber with mild vascular  calcifications. Coronary artery calcifications are seen. Bovine type aortic arch, normal variant. Mediastinum/Nodes: Small thyroid gland. Diffusely patulous fluid-filled esophagus. On this non IV contrast exam there is no discrete abnormal lymph node enlargement identified in the axillary region, hilum or mediastinum. Exception is the node just above the diaphragm near midline measuring 18 by 14 mm today and previously 18 x 12 mm. Lungs/Pleura: Emphysematous changes seen of the right lung. Right upper lobe spiculated nodule is again seen. Previously this measured 6 mm in maximal dimension and today on series 4, image 44 measures 7.4 by 5.0 cm. This nodule was 2 mm in April 2022, 5 mm in September 2023 demonstrating slow progressive growth. Small area of ground-glass focally is stable in the right upper lobe on series 4, image 70 measuring 9 mm. Stable nodule right lower lobe laterally measuring 2 mm on series 4, image 90. Again volume loss of the left hemithorax. Trace pleural fluid with some scattered pleural thickening posteriorly. There are reticulonodular parenchymal opacities identified developing in the left lung. There is increasing consolidative opacity with air bronchograms in the left lower lobe. The central confluence nodule in this location previously measured 18 x 17 mm and today this area on series 4, image 99 would measure 19 x 17 mm. The adjacent areas are increasing. There is a fiduciary marker anteromedial to this area. The small spiculated nodular areas in the left upper lobe with a opacities are also increasing slightly. Upper Abdomen: No acute abnormality. Stable slight nodular contour to the liver. Preserved adrenal glands. Musculoskeletal: Scattered degenerative changes seen of the spine and pelvis. IMPRESSION: Increasing parenchymal opacities and nodularity surrounding the area of central nodule in the left lower lobe. Additional areas of reticulonodular changes increasing in the left upper  lobe. Small spiculated right upper lobe nodule  continues to show some slow interval increase in size while some other nodules in the right lung are stable. Recommend continued follow-up/workup. Slight increase in size of the abnormal lymph node just above the diaphragm in the midline, anterior to the esophagus. No additional lymph node enlargement. Persistent right-to-left midline shift of mediastinal structures. Patulous fluid-filled esophagus. Aortic Atherosclerosis (ICD10-I70.0) and Emphysema (ICD10-J43.9). Electronically Signed   By: Karen Kays M.D.   On: 06/07/2022 16:28

## 2022-06-15 NOTE — Patient Instructions (Signed)
[facility]  Discharge Instructions  You were seen and examined today by Dr. Ellin Saba.  Dr. Ellin Saba discussed your most recent lab work and CT scan which revealed that everything looks stable except your iron is slightly low.  Start taking an Iron pill once daily. Dr. Ellin Saba is going to recheck a CT of your chest before your next appointment.  Follow-up as scheduled in 4 months.    Thank you for choosing [facility] to provide your oncology and hematology care.   To afford each patient quality time with our provider, please arrive at least 15 minutes before your scheduled appointment time. You may need to reschedule your appointment if you arrive late (10 or more minutes). Arriving late affects you and other patients whose appointments are after yours.  Also, if you miss three or more appointments without notifying the office, you may be dismissed from the clinic at the provider's discretion.    Again, thank you for choosing [facility].  Our hope is that these requests will decrease the amount of time that you wait before being seen by our physicians.   If you have a lab appointment with the Cancer Center - please note that after April 8th, all labs will be drawn in the cancer center.  You do not have to check in or register with the main entrance as you have in the past but will complete your check-in at the cancer center.            _____________________________________________________________  Should you have questions after your visit to [facility], please contact our office at [phone] and follow the prompts.  Our office hours are 8:00 a.m. to 4:30 p.m. Monday - Thursday and 8:00 a.m. to 2:30 p.m. Friday.  Please note that voicemails left after 4:00 p.m. may not be returned until the following business day.  We are closed weekends and all major holidays.  You do have access to a nurse 24-7,  just call the main number to the clinic [phone] and do not press any options, hold on the line and a nurse will answer the phone.    For prescription refill requests, have your pharmacy contact our office and allow 72 hours.    Masks are no longer required in the cancer centers. If you would like for your care team to wear a mask while they are taking care of you, please let them know. You may have one support person who is at least 67 years old accompany you for your appointments.

## 2022-07-27 ENCOUNTER — Encounter: Payer: Self-pay | Admitting: Gastroenterology

## 2022-09-23 ENCOUNTER — Encounter: Payer: Self-pay | Admitting: Pharmacist

## 2022-09-27 ENCOUNTER — Ambulatory Visit (INDEPENDENT_AMBULATORY_CARE_PROVIDER_SITE_OTHER): Payer: Medicare HMO | Admitting: Family Medicine

## 2022-09-27 ENCOUNTER — Encounter: Payer: Self-pay | Admitting: Family Medicine

## 2022-09-27 VITALS — BP 123/77 | HR 114 | Ht 68.0 in | Wt 168.1 lb

## 2022-09-27 DIAGNOSIS — E782 Mixed hyperlipidemia: Secondary | ICD-10-CM

## 2022-09-27 DIAGNOSIS — F411 Generalized anxiety disorder: Secondary | ICD-10-CM

## 2022-09-27 DIAGNOSIS — F32A Depression, unspecified: Secondary | ICD-10-CM

## 2022-09-27 DIAGNOSIS — Z23 Encounter for immunization: Secondary | ICD-10-CM

## 2022-09-27 DIAGNOSIS — Z0001 Encounter for general adult medical examination with abnormal findings: Secondary | ICD-10-CM | POA: Diagnosis not present

## 2022-09-27 DIAGNOSIS — F419 Anxiety disorder, unspecified: Secondary | ICD-10-CM | POA: Diagnosis not present

## 2022-09-27 DIAGNOSIS — F17218 Nicotine dependence, cigarettes, with other nicotine-induced disorders: Secondary | ICD-10-CM | POA: Diagnosis not present

## 2022-09-27 DIAGNOSIS — I10 Essential (primary) hypertension: Secondary | ICD-10-CM | POA: Diagnosis not present

## 2022-09-27 DIAGNOSIS — H547 Unspecified visual loss: Secondary | ICD-10-CM

## 2022-09-27 MED ORDER — BUSPIRONE HCL 5 MG PO TABS: 5.0000 mg | ORAL_TABLET | Freq: Three times a day (TID) | ORAL | 2 refills | Status: DC

## 2022-09-27 NOTE — Patient Instructions (Addendum)
F/U in 6 to 8 weeks re evaluate anxiety, call if you need me before  Flu vaccine today  TSH, chem 7 and EGFr, lipid panel today  New med buspar  3 times daily, 7 am , 3 pm and 11 pm  You are referred to therapist , I believe this will help   Try to limit ciggs to 9/day for the next 2 week, then 8 per day  for the following 2 weeks  Resuming crochet is an excellent idea  Please get , covid , TdAP and shingles  vaccines at your pharmacy  Nurse pls document eye exam at checkout  .Thanks for choosing [facility], we consider it a privelige to serve you.

## 2022-09-28 LAB — BMP8+EGFR
BUN/Creatinine Ratio: 16 (ref 12–28)
BUN: 17 mg/dL (ref 8–27)
CO2: 20 mmol/L (ref 20–29)
Calcium: 10.4 mg/dL — ABNORMAL HIGH (ref 8.7–10.3)
Chloride: 106 mmol/L (ref 96–106)
Creatinine, Ser: 1.07 mg/dL — ABNORMAL HIGH (ref 0.57–1.00)
Glucose: 94 mg/dL (ref 70–99)
Potassium: 4.1 mmol/L (ref 3.5–5.2)
Sodium: 142 mmol/L (ref 134–144)
eGFR: 57 mL/min/{1.73_m2} — ABNORMAL LOW (ref >59)

## 2022-09-28 LAB — LIPID PANEL
Chol/HDL Ratio: 4.3 ratio (ref 0.0–4.4)
Cholesterol, Total: 163 mg/dL (ref 100–199)
HDL: 38 mg/dL — ABNORMAL LOW (ref >39)
LDL Chol Calc (NIH): 83 mg/dL (ref 0–99)
Triglycerides: 255 mg/dL — ABNORMAL HIGH (ref 0–149)
VLDL Cholesterol Cal: 42 mg/dL — ABNORMAL HIGH (ref 5–40)

## 2022-09-28 LAB — TSH: TSH: 1.31 u[IU]/mL (ref 0.450–4.500)

## 2022-09-28 MED ORDER — EZETIMIBE 10 MG PO TABS: 10.0000 mg | ORAL_TABLET | Freq: Every day | ORAL | 5 refills | Status: DC

## 2022-09-30 ENCOUNTER — Other Ambulatory Visit: Payer: Self-pay | Admitting: Family Medicine

## 2022-09-30 DIAGNOSIS — E038 Other specified hypothyroidism: Secondary | ICD-10-CM

## 2022-10-01 ENCOUNTER — Encounter: Payer: Self-pay | Admitting: Family Medicine

## 2022-10-01 DIAGNOSIS — F411 Generalized anxiety disorder: Secondary | ICD-10-CM | POA: Insufficient documentation

## 2022-10-01 NOTE — Assessment & Plan Note (Signed)
Annual exam as documented. . Immunization and cancer screening needs are specifically addressed at this visit.

## 2022-10-01 NOTE — Assessment & Plan Note (Signed)
Increased anxiety due to dementia in spouse, will start buspar which she has used in the past and will start therapy

## 2022-10-01 NOTE — Assessment & Plan Note (Signed)
Hyperlipidemia:Low fat diet discussed and encouraged.   Lipid Panel  Lab Results  Component Value Date   CHOL 163 09/27/2022   HDL 38 (L) 09/27/2022   LDLCALC 83 09/27/2022   TRIG 255 (H) 09/27/2022   CHOLHDL 4.3 09/27/2022     Zetia added and she needs to reduce fat in diet

## 2022-10-01 NOTE — Assessment & Plan Note (Signed)
Refer to therapy spouse has dementia and she is the primary caregiver

## 2022-10-01 NOTE — Progress Notes (Signed)
Kelly Douglas     MRN: [id number]      DOB: 1955/12/15  Chief Complaint  Patient presents with   Hyperlipidemia    F/u   Immunizations    Consented to Flu shot today. Has not received shingles at pharmacy as of yet. Not sure of Tdap.     HPI: Patient is in for annual physical exam.  labs,  are reviewed.taken on day of visit and zetia added Anxiety addressed, med added and referral to Therapist Smoking cessation counseling done Immunization is reviewed , and  updated  Patient and/or legal guardian verbally consented to [facility] services about presenting concerns and psychiatric consultation as appropriate.  The services will be billed as appropriate for the patient    PE: BP 123/77 (BP Location: Right Arm, Patient Position: Sitting, Cuff Size: Normal)   Pulse (!) 114   Ht 5\' 8"  (1.727 m)   Wt 168 lb 1.9 oz (76.3 kg)   LMP  (LMP Unknown)   SpO2 91%   BMI 25.56 kg/m   Pleasant  female, alert and oriented x 3, in no cardio-pulmonary distress. Afebrile. HEENT No facial trauma or asymetry. .  Extra occullar muscles intact.. External ears normal, . Neck: supple, no adenopathy,JVD or thyromegaly.No bruits.  Chest: Clear to ascultation bilaterally.No crackles or wheezes.Decreased air entry throughout Non tender to palpation  Breast: No asymetry,no masses or lumps. No tenderness. No nipple discharge or inversion. No axillary or supraclavicular adenopathy  Cardiovascular system; Heart sounds normal,  S1 and  S2 ,no S3.  No murmur, or thrill.   Abdomen: Soft, non tender,und.     Musculoskeletal exam: Decreased though adequate  ROM of spine, hips , shoulders and knees.  deformity ,swelling or crepitus noted. No muscle wasting or atrophy.   Neurologic: Cranial nerves 2 to 12 intact. Power,  normal throughout. No disturbance in gait. Mild  tremor.  Skin: Intact, no ulceration, erythema , scaling or rash noted. Pigmentation  normal throughout  Psych; Anxious and mildly depressed, good eye contact Judgement and concentration normal   Assessment & Plan:  GAD (generalized anxiety disorder) Increased anxiety due to dementia in spouse, will start buspar which she has used in the past and will start therapy  Anxiety and depression Refer to therapy spouse has dementia and she is the primary caregiver  Encounter for Medicare annual examination with abnormal findings Annual exam as documented. Immunization and cancer screening needs are specifically addressed at this visit.   Hyperlipidemia Hyperlipidemia:Low fat diet discussed and encouraged.   Lipid Panel  Lab Results  Component Value Date   CHOL 163 09/27/2022   HDL 38 (L) 09/27/2022   LDLCALC 83 09/27/2022   TRIG 255 (H) 09/27/2022   CHOLHDL 4.3 09/27/2022     Zetia added and she needs to reduce fat in diet  Nicotine dependence Asked:confirms currently smokes cigarettes approx 10/day Assess: Unwilling to set a quit date, states she has too much anxiety at this time, however she does want to quit, recently dx with lung cancer Advise: needs to QUIT to reduce risk of cancer, cardio and cerebrovascular disease Assist: counseled for 5 minutes and literature provided Arrange: follow up in 2 to 4 months

## 2022-10-01 NOTE — Assessment & Plan Note (Signed)
Asked:confirms currently smokes cigarettes approx 10/day Assess: Unwilling to set a quit date, states she has too much anxiety at this time, however she does want to quit, recently dx with lung cancer Advise: needs to QUIT to reduce risk of cancer, cardio and cerebrovascular disease Assist: counseled for 5 minutes and literature provided Arrange: follow up in 2 to 4 months

## 2022-10-13 ENCOUNTER — Inpatient Hospital Stay: Payer: Medicare HMO | Attending: Hematology

## 2022-10-13 ENCOUNTER — Ambulatory Visit (HOSPITAL_COMMUNITY)
Admission: RE | Admit: 2022-10-13 | Discharge: 2022-10-13 | Disposition: A | Discharge status: Home / Self Care | Payer: Medicare HMO | Source: Ambulatory Visit | Attending: Hematology | Admitting: Hematology

## 2022-10-13 DIAGNOSIS — C3492 Malignant neoplasm of unspecified part of left bronchus or lung: Secondary | ICD-10-CM | POA: Insufficient documentation

## 2022-10-13 DIAGNOSIS — J432 Centrilobular emphysema: Secondary | ICD-10-CM | POA: Diagnosis not present

## 2022-10-13 DIAGNOSIS — C3432 Malignant neoplasm of lower lobe, left bronchus or lung: Secondary | ICD-10-CM | POA: Insufficient documentation

## 2022-10-13 DIAGNOSIS — I700 Atherosclerosis of aorta: Secondary | ICD-10-CM | POA: Diagnosis not present

## 2022-10-13 DIAGNOSIS — I3139 Other pericardial effusion (noninflammatory): Secondary | ICD-10-CM | POA: Diagnosis not present

## 2022-10-13 LAB — CBC WITH DIFFERENTIAL/PLATELET
Abs Immature Granulocytes: 0.04 10*3/uL (ref 0.00–0.07)
Basophils Absolute: 0.1 10*3/uL (ref 0.0–0.1)
Basophils Relative: 0 %
Eosinophils Absolute: 0.1 10*3/uL (ref 0.0–0.5)
Eosinophils Relative: 1 %
HCT: 40 % (ref 36.0–46.0)
Hemoglobin: 13.1 g/dL (ref 12.0–15.0)
Immature Granulocytes: 0 %
Lymphocytes Relative: 34 %
Lymphs Abs: 4 10*3/uL (ref 0.7–4.0)
MCH: 25.9 pg — ABNORMAL LOW (ref 26.0–34.0)
MCHC: 32.8 g/dL (ref 30.0–36.0)
MCV: 79.1 fL — ABNORMAL LOW (ref 80.0–100.0)
Monocytes Absolute: 1.2 10*3/uL — ABNORMAL HIGH (ref 0.1–1.0)
Monocytes Relative: 10 %
Neutro Abs: 6.6 10*3/uL (ref 1.7–7.7)
Neutrophils Relative %: 55 %
Platelets: 346 10*3/uL (ref 150–400)
RBC: 5.06 MIL/uL (ref 3.87–5.11)
RDW: 16.1 % — ABNORMAL HIGH (ref 11.5–15.5)
WBC: 12 10*3/uL — ABNORMAL HIGH (ref 4.0–10.5)
nRBC: 0 % (ref 0.0–0.2)

## 2022-10-13 LAB — FERRITIN: Ferritin: 18 ng/mL (ref 11–307)

## 2022-10-13 LAB — IRON AND TIBC
Iron: 64 ug/dL (ref 28–170)
Saturation Ratios: 14 % (ref 10.4–31.8)
TIBC: 465 ug/dL — ABNORMAL HIGH (ref 250–450)
UIBC: 401 ug/dL

## 2022-10-19 NOTE — Progress Notes (Signed)
[facility] [address], [place] [address]    Clinic Day:  10/20/2022  Referring physician: Kerri Perches, MD  Patient Care Team: Kerri Perches, MD as PCP - General Rothbart, Gerrit Friends, MD (Inactive) (Cardiology) Jena Gauss Gerrit Friends, MD (Gastroenterology) Lurena Nida, PTA as Physical Therapy Assistant (Physical Therapy) Newman Pies, MD as Consulting Physician (Otolaryngology)   ASSESSMENT & PLAN:   Assessment: 1.  Adenocarcinoma of left lower lobe of lung: -She is followed by Dr. Delton Coombes and left lower lobe lung nodule was being watched closely for the last few years. -She had increase in size of the left lower lobe lung nodule recently on CT scan. -PET scan on 12/09/2019 showed 2 cm left lower lobe pulmonary nodule with some solid elements with maximum SUV 4.7.  No adenopathy reported. -Bronchoscopy and biopsy of the left lower lobe nodule consistent with adenocarcinoma.  Left lower lobe brushings also positive for adenocarcinoma.  Left upper lobe brushings did not show any malignant cells. - XRT to the left and right lungs lesions from 11/24/2021 through 12/07/2021   2.  Social/family history: - Current active smoker, half pack per day since age 62. -Lives with husband and brother at home.  Activity is limited secondary to back and hip pain. -Brother had throat cancer and paternal cousin had throat cancer.    Plan: 1.  Clinical stage I adenocarcinoma of left lower lobe of lung: - Continues to smoke half pack of cigarettes per day. - CT chest (10/13/2022): Decrease size of left lower lobe nodule and midline.  Diaphragmatic lymph node.  New small pulmonary nodule in the right lower lobe 3 mm.  Spiculated solid nodule of the right upper lobe is stable. - Recommend follow-up in 4 months with repeat CT scan of the chest.   2.  Microcytic anemia: - Continue iron tablet daily.  Ferritin is 18.  Hemoglobin is normal at 13.1.  Will repeat ferritin and iron  panel at next visit.    Orders Placed This Encounter  Procedures   CT Chest Wo Contrast    Standing Status:   Future    Standing Expiration Date:   10/20/2023    Order Specific Question:   Preferred imaging location?    Answer:   [facility]   CBC with Differential    Standing Status:   Future    Standing Expiration Date:   10/20/2023   Comprehensive metabolic panel    Standing Status:   Future    Standing Expiration Date:   10/20/2023   Iron and TIBC ([facility] ONLY)    Standing Status:   Future    Standing Expiration Date:   10/20/2023   Ferritin    Standing Status:   Future    Standing Expiration Date:   10/20/2023      Mikeal Hawthorne R Teague,acting as a scribe for Doreatha Massed, MD.,have documented all relevant documentation on the behalf of Doreatha Massed, MD,as directed by  Doreatha Massed, MD while in the presence of Doreatha Massed, MD.  I, Doreatha Massed MD, have reviewed the above documentation for accuracy and completeness, and I agree with the above.    Doreatha Massed, MD   10/3/20246:03 PM  CHIEF COMPLAINT:   Diagnosis: left lung cancer    Cancer Staging  No matching staging information was found for the patient.    Prior Therapy: XRT to bilateral lung lesions, 11/24/21 - 12/07/21  Current Therapy:  surveillance   HISTORY  OF PRESENT ILLNESS:   Oncology History   No history exists.     INTERVAL HISTORY:   Kelly Douglas is a 67 y.o. female presenting to clinic today for follow up of left lung cancer. She was last seen by me on 06/15/22.  Since her last visit, she underwent surveillance chest CT on 10/13/22 that found: decreased size of left lower lobe nodule; decreased adjacent linear and ground-glass opacities, consistent with evolving postradiation change; decreased size of midline peridiaphragmatic lymph node; new small solid pulmonary nodule of the right lower lobe measuring 3 mm; spiculated solid nodule of the  right upper lobe is stable when compared with most recent prior exam but has enlarged when compared with more remote prior; additional solid and ground-glass pulmonary nodules are stable; and patulous fluid-filled esophagus, seen in the setting of esophageal dysmotility.  Today, she states that she is doing well overall. Her appetite level is at 90%. Her energy level is at 25%. She denies any recent respiratory infections. She is taking iron tablets OD. She reports she has felt lightheaded recently.   PAST MEDICAL HISTORY:   Past Medical History: Past Medical History:  Diagnosis Date   Anxiety    Arteriosclerotic cardiovascular disease (ASCVD) 2013   coronary calcification-left main, LAD and CX; small pericardial effusion   Arthritis    hips   Cancer (HCC)    Lung Cancer    Chronic back pain    Chronic bronchitis    Chronic lung disease    Chronic scarring and volume loss-left lung; characteristics of a chronic infectious process-possible MAI   Depression    Dyspnea    occasional    GERD (gastroesophageal reflux disease)    Dr Jena Gauss EGD 09/2009->esophagitis, sm HH, antral erosions, atonic esophagus   History of radiation therapy    Left and Right Lung- 11/24/21-12/07/21- Dr. Antony Blackbird   Hyperlipidemia    Hypertension    Hypothyroid 1981 approx   Scleroderma (HCC)    Seasonal allergies    Syncope    Multiple spells over the past 40+ years, likely neurocardiogenic   Tobacco abuse 06/24/2009    Surgical History: Past Surgical History:  Procedure Laterality Date   BILATERAL SALPINGOOPHORECTOMY  2013    Dr. Despina Hidden; uterus remains in situ   BRONCHIAL BIOPSY  11/21/2019   Procedure: BRONCHIAL BIOPSIES;  Surgeon: Leslye Peer, MD;  Location: [facility] ENDOSCOPY;  Service: Pulmonary;;   BRONCHIAL BRUSHINGS  11/21/2019   Procedure: BRONCHIAL BRUSHINGS;  Surgeon: Leslye Peer, MD;  Location: [facility] ENDOSCOPY;  Service: Pulmonary;;   BRONCHIAL NEEDLE ASPIRATION BIOPSY  11/21/2019    Procedure: BRONCHIAL NEEDLE ASPIRATION BIOPSIES;  Surgeon: Leslye Peer, MD;  Location: [facility] ENDOSCOPY;  Service: Pulmonary;;   BRONCHIAL WASHINGS  11/21/2019   Procedure: BRONCHIAL WASHINGS;  Surgeon: Leslye Peer, MD;  Location: [facility] ENDOSCOPY;  Service: Pulmonary;;   COLONOSCOPY  09/28/09   anal papilla otherwise normal   COLONOSCOPY N/A 08/29/2012   XBM:[id number] polyp-removed as described above. tubular adenoma   COLONOSCOPY WITH PROPOFOL N/A 11/11/2019   Procedure: COLONOSCOPY WITH PROPOFOL;  Surgeon: Corbin Ade, MD;  Location: [facility] ENDO SUITE;  Service: Endoscopy;  Laterality: N/A;  2:30pm   ESOPHAGOGASTRODUODENOSCOPY  10/07/09   Dr. Harless Nakayama of esophageal mucosa, diffusely ?esophagitis (bx benign), small HH/antral erosions, erythema bx benign. atonic esophagus (?scleraderma esophagus)   ESOPHAGOGASTRODUODENOSCOPY (EGD) WITH ESOPHAGEAL DILATION N/A 03/07/2012   WNU:[id number], patent, tubular esophagus of uncertain significance-status post biopsy )unremarkable). Hiatal hernia   FUDUCIAL PLACEMENT  10/18/2017   Procedure: PLACEMENT OF 3 FUDUCIAL INTO LEFT LOWER LOBE OF LUNG-TARGET 1;  Surgeon: Leslye Peer, MD;  Location: [facility] OR;  Service: Thoracic;;   LAPAROSCOPIC APPENDECTOMY  07/16/2011   Procedure: APPENDECTOMY LAPAROSCOPIC;  Surgeon: Fabio Bering, MD;  Location: [facility] ORS;  Service: General;  Laterality: N/A;   POLYPECTOMY  11/11/2019   Procedure: POLYPECTOMY;  Surgeon: Corbin Ade, MD;  Location: [facility] ENDO SUITE;  Service: Endoscopy;;   TUBAL LIGATION     VIDEO BRONCHOSCOPY  10/18/2017   VIDEO BRONCHOSCOPY WITH ENDOBRONCHIAL NAVIGATION N/A 10/18/2017   Procedure: VIDEO BRONCHOSCOPY WITH ENDOBRONCHIAL NAVIGATION;  Surgeon: Leslye Peer, MD;  Location: [facility] OR;  Service: Thoracic;  Laterality: N/A;   VIDEO BRONCHOSCOPY WITH ENDOBRONCHIAL NAVIGATION Left 11/21/2019   Procedure: VIDEO BRONCHOSCOPY WITH ENDOBRONCHIAL NAVIGATION;  Surgeon: Leslye Peer, MD;  Location: [facility]  ENDOSCOPY;  Service: Pulmonary;  Laterality: Left;    Social History: Social History   Socioeconomic History   Marital status: Married    Spouse name: Not on file   Number of children: 2   Years of education: Not on file   Highest education level: Not on file  Occupational History   Occupation: disabled    Employer: NOT EMPLOYED  Tobacco Use   Smoking status: Every Day    Current packs/day: 0.50    Average packs/day: 0.5 packs/day for 51.3 years (25.6 ttl pk-yrs)    Types: Cigarettes    Start date: 07/14/1971   Smokeless tobacco: Never   Tobacco comments:    0.5 packs of cigarettes smoked daily ARJ 12/31/19  Vaping Use   Vaping status: Never Used  Substance and Sexual Activity   Alcohol use: Not Currently    Alcohol/week: 0.0 standard drinks of alcohol    Comment: quit June 2012-used to drink 6 pack daily, occasional beer per pt 11/20/19   Drug use: No   Sexual activity: Yes    Birth control/protection: Post-menopausal  Other Topics Concern   Not on file  Social History Narrative   Not on file   Social Determinants of Health   Financial Resource Strain: Low Risk  (11/25/2020)   Overall Financial Resource Strain (CARDIA)    Difficulty of Paying Living Expenses: Not hard at all  Food Insecurity: No Food Insecurity (11/25/2020)   Hunger Vital Sign    Worried About Running Out of Food in the Last Year: Never true    Ran Out of Food in the Last Year: Never true  Transportation Needs: No Transportation Needs (11/25/2020)   PRAPARE - Administrator, Civil Service (Medical): No    Lack of Transportation (Non-Medical): No  Physical Activity: Insufficiently Active (11/25/2020)   Exercise Vital Sign    Days of Exercise per Week: 3 days    Minutes of Exercise per Session: 30 min  Stress: No Stress Concern Present (11/25/2020)   [employer] of Occupational Health - Occupational Stress Questionnaire    Feeling of Stress : Not at all  Social Connections:  Moderately Integrated (11/25/2020)   Social Connection and Isolation Panel [NHANES]    Frequency of Communication with Friends and Family: Three times a week    Frequency of Social Gatherings with Friends and Family: Once a week    Attends Religious Services: More than 4 times per year    Active Member of [employer] or Organizations: No    Attends Banker Meetings: Never    Marital Status: Married  Catering manager Violence:  Not At Risk (11/25/2020)   Humiliation, Afraid, Rape, and Kick questionnaire    Fear of Current or Ex-Partner: No    Emotionally Abused: No    Physically Abused: No    Sexually Abused: No    Family History: Family History  Problem Relation Age of Onset   Aneurysm Mother    Stroke Father    Aneurysm Father    Hypertension Sister    Cancer Brother        throat cancer   Coronary artery disease Brother    Hypertension Brother    Down syndrome Brother    Hypertension Brother    Hypertension Brother        father/mother   Throat cancer Brother 49       throat cancer   Diabetes Brother    Diabetes Brother    Anesthesia problems Neg Hx    Hypotension Neg Hx    Malignant hyperthermia Neg Hx    Pseudochol deficiency Neg Hx    Colon cancer Neg Hx     Current Medications:  Current Outpatient Medications:    acetaminophen (TYLENOL) 650 MG CR tablet, Take 1,300 mg by mouth every 8 (eight) hours as needed for pain. , Disp: , Rfl:    albuterol (VENTOLIN HFA) 108 (90 Base) MCG/ACT inhaler, Inhale 1-2 puffs into the lungs every 6 (six) hours as needed for wheezing or shortness of breath., Disp: 18 each, Rfl: 1   amLODipine (NORVASC) 5 MG tablet, TAKE 1 TABLET EVERY DAY, Disp: 90 tablet, Rfl: 3   atorvastatin (LIPITOR) 10 MG tablet, TAKE 1 TABLET EVERY DAY, Disp: 90 tablet, Rfl: 3   busPIRone (BUSPAR) 5 MG tablet, Take 1 tablet (5 mg total) by mouth 3 (three) times daily., Disp: 90 tablet, Rfl: 2   Cholecalciferol (VITAMIN D) 125 MCG (5000 UT) CAPS, Take  5,000 Units by mouth daily. , Disp: , Rfl:    DULoxetine (CYMBALTA) 60 MG capsule, TAKE 1 CAPSULE EVERY DAY, Disp: 90 capsule, Rfl: 3   ezetimibe (ZETIA) 10 MG tablet, Take 1 tablet (10 mg total) by mouth daily., Disp: 30 tablet, Rfl: 5   famotidine (PEPCID) 20 MG tablet, TAKE 1 TABLET EVERY DAY, Disp: 90 tablet, Rfl: 3   hydrOXYzine (VISTARIL) 50 MG capsule, Take 50 mg by mouth 2 (two) times daily as needed., Disp: , Rfl:    levothyroxine (SYNTHROID) 75 MCG tablet, TAKE 1 TABLET EVERY OTHER DAY ALTERNATING WITH 1/2 TABLET EVERY OTHER DAY, Disp: 66 tablet, Rfl: 3   magnesium oxide (MAG-OX) 400 (240 Mg) MG tablet, TAKE 1 TABLET THREE TIMES DAILY, Disp: 270 tablet, Rfl: 2   meloxicam (MOBIC) 7.5 MG tablet, TAKE 1 TABLET EVERY DAY, Disp: 90 tablet, Rfl: 3   mirtazapine (REMERON) 7.5 MG tablet, TAKE 1 TABLET AT BEDTIME, Disp: 90 tablet, Rfl: 3   montelukast (SINGULAIR) 10 MG tablet, TAKE 1 TABLET AT BEDTIME, Disp: 90 tablet, Rfl: 3   Multiple Vitamin (MULTIVITAMIN WITH MINERALS) TABS tablet, Take 1 tablet by mouth daily., Disp: , Rfl:    pantoprazole (PROTONIX) 40 MG tablet, TAKE 1 TABLET TWICE DAILY, Disp: 180 tablet, Rfl: 3   Allergies: Allergies  Allergen Reactions   Aspirin Other (See Comments)    Nose bleeds    REVIEW OF SYSTEMS:   Review of Systems  Constitutional:  Negative for chills, fatigue and fever.  HENT:   Negative for lump/mass, mouth sores, nosebleeds, sore throat and trouble swallowing.   Eyes:  Negative for eye problems.  Respiratory:  Negative for cough and shortness of breath.   Cardiovascular:  Negative for chest pain, leg swelling and palpitations.  Gastrointestinal:  Positive for constipation. Negative for abdominal pain, diarrhea, nausea and vomiting.  Genitourinary:  Negative for bladder incontinence, difficulty urinating, dysuria, frequency, hematuria and nocturia.   Musculoskeletal:  Negative for arthralgias, back pain, flank pain, myalgias and neck pain.  Skin:   Negative for itching and rash.  Neurological:  Positive for light-headedness. Negative for dizziness, headaches and numbness.  Hematological:  Does not bruise/bleed easily.  Psychiatric/Behavioral:  Positive for sleep disturbance. Negative for depression and suicidal ideas. The patient is not nervous/anxious.   All other systems reviewed and are negative.    VITALS:   Blood pressure 125/75, pulse 92, temperature 98.3 F (36.8 C), temperature source Oral, resp. rate 18, height 5\' 8"  (1.727 m), weight 172 lb 6.4 oz (78.2 kg), SpO2 100%.  Wt Readings from Last 3 Encounters:  10/20/22 172 lb 6.4 oz (78.2 kg)  09/27/22 168 lb 1.9 oz (76.3 kg)  06/15/22 167 lb 8 oz (76 kg)    Body mass index is 26.21 kg/m.  Performance status (ECOG): 1 - Symptomatic but completely ambulatory  PHYSICAL EXAM:   Physical Exam Vitals and nursing note reviewed. Exam conducted with a chaperone present.  Constitutional:      Appearance: Normal appearance.  Cardiovascular:     Rate and Rhythm: Normal rate and regular rhythm.     Pulses: Normal pulses.     Heart sounds: Normal heart sounds.  Pulmonary:     Effort: Pulmonary effort is normal.     Breath sounds: Normal breath sounds.  Abdominal:     Palpations: Abdomen is soft. There is no hepatomegaly, splenomegaly or mass.     Tenderness: There is no abdominal tenderness.  Musculoskeletal:     Right lower leg: No edema.     Left lower leg: No edema.  Lymphadenopathy:     Cervical: No cervical adenopathy.     Right cervical: No superficial, deep or posterior cervical adenopathy.    Left cervical: No superficial, deep or posterior cervical adenopathy.     Upper Body:     Right upper body: No supraclavicular or axillary adenopathy.     Left upper body: No supraclavicular or axillary adenopathy.  Neurological:     General: No focal deficit present.     Mental Status: She is alert and oriented to person, place, and time.  Psychiatric:        Mood and  Affect: Mood normal.        Behavior: Behavior normal.     LABS:      Latest Ref Rng & Units 10/13/2022    3:18 PM 06/03/2022    2:08 PM 03/03/2022    4:11 PM  CBC  WBC 4.0 - 10.5 K/uL 12.0  9.9  11.4   Hemoglobin 12.0 - 15.0 g/dL 16.1  09.6  04.5   Hematocrit 36.0 - 46.0 % 40.0  38.6  37.5   Platelets 150 - 400 K/uL 346  374  285       Latest Ref Rng & Units 09/27/2022    4:23 PM 06/09/2022    1:55 PM 06/03/2022    2:08 PM  CMP  Glucose 70 - 99 mg/dL 94  81  409   BUN 8 - 27 mg/dL 17  11  9    Creatinine 0.57 - 1.00 mg/dL 8.11  9.14  7.82   Sodium 134 - 144 mmol/L  142  140  136   Potassium 3.5 - 5.2 mmol/L 4.1  4.7  4.0   Chloride 96 - 106 mmol/L 106  102  102   CO2 20 - 29 mmol/L 20  23  23    Calcium 8.7 - 10.3 mg/dL 16.1  09.6  9.3   Total Protein 6.5 - 8.1 g/dL   7.7   Total Bilirubin 0.3 - 1.2 mg/dL   0.6   Alkaline Phos 38 - 126 U/L   127   AST 15 - 41 U/L   28   ALT 0 - 44 U/L   23      No results found for: "CEA1", "CEA" / No results found for: "CEA1", "CEA" No results found for: "PSA1" No results found for: "EAV409" No results found for: "CAN125"  No results found for: "TOTALPROTELP", "ALBUMINELP", "A1GS", "A2GS", "BETS", "BETA2SER", "GAMS", "MSPIKE", "SPEI" Lab Results  Component Value Date   TIBC 465 (H) 10/13/2022   TIBC 437 06/03/2022   TIBC 398 02/01/2013   FERRITIN 18 10/13/2022   FERRITIN 14 06/03/2022   FERRITIN 30 05/03/2013   IRONPCTSAT 14 10/13/2022   IRONPCTSAT 15 06/03/2022   IRONPCTSAT 27 02/01/2013   No results found for: "LDH"   STUDIES:   CT Chest Wo Contrast  Result Date: 10/20/2022 CLINICAL DATA:  Non-small cell lung cancer left lung; * Tracking Code: BO * EXAM: CT CHEST WITHOUT CONTRAST TECHNIQUE: Multidetector CT imaging of the chest was performed following the standard protocol without IV contrast. RADIATION DOSE REDUCTION: This exam was performed according to the departmental dose-optimization program which includes automated  exposure control, adjustment of the mA and/or kV according to patient size and/or use of iterative reconstruction technique. COMPARISON:  Multiple priors, most recent chest CT dated Jun 03, 2022 FINDINGS: Cardiovascular: Normal heart size. Small pericardial effusion. Normal caliber thoracic aorta with mild calcified plaque. Severe coronary artery calcifications. Mediastinum/Nodes: Patulous fluid-filled esophagus. Decreased size of midline peridiaphragmatic lymph node located anterior to the esophagus. Measures 9 mm in short axis on series 2, image 113, previously 0.8 cm. No enlarging lymph nodes seen in the chest. Lungs/Pleura: Central airways are patent. Moderate centrilobular emphysema. Decreased size of left lower lobe nodule measuring 1.4 x 1.3 cm on series 2, image 95, previously 1.7 x 1.9 cm. Decreased adjacent linear and ground-glass opacities, consistent with evolving postradiation change. Stable irregular solid nodule of the right upper lobe measuring 7 x 5 mm on series 3, image 41. Stable right upper lobe ground-glass nodule measuring 9 mm on image 66. New small solid pulmonary nodule of the right lower lobe measuring 3 mm on image 122 stable solid nodule of the right lower lobe measuring 2 mm on image 85. Unchanged left apical pleural-parenchymal scarring. Numerous ground-glass, irregular solid nodules and linear opacities are again seen in the left upper lobe, unchanged when compared with the prior. Reference solid nodule measuring 7 mm on series 2, image 49, unchanged. No pleural effusion or pneumothorax. Upper Abdomen: Hepatic steatosis. Musculoskeletal: No chest wall mass or suspicious bone lesions identified. IMPRESSION: 1. Decreased size of left lower lobe nodule. Decreased adjacent linear and ground-glass opacities, consistent with evolving postradiation change. 2. Decreased size of midline peridiaphragmatic lymph node. 3. New small solid pulmonary nodule of the right lower lobe measuring 3 mm.  Recommend attention on follow-up. 4. Spiculated solid nodule of the right upper lobe is stable when compared with most recent prior exam but has enlarged when compared with more remote prior. Recommend  attention on follow-up. 5. Additional solid and ground-glass pulmonary nodules are stable. 6. Patulous fluid-filled esophagus. Findings can be seen in the setting of esophageal dysmotility. 7. Aortic Atherosclerosis (ICD10-I70.0) and Emphysema (ICD10-J43.9). Electronically Signed   By: Allegra Lai M.D.   On: 10/20/2022 09:31

## 2022-10-20 ENCOUNTER — Inpatient Hospital Stay: Payer: Medicare HMO | Attending: Hematology | Admitting: Hematology

## 2022-10-20 VITALS — BP 125/75 | HR 92 | Temp 98.3°F | Resp 18 | Ht 68.0 in | Wt 172.4 lb

## 2022-10-20 DIAGNOSIS — Z79899 Other long term (current) drug therapy: Secondary | ICD-10-CM | POA: Diagnosis not present

## 2022-10-20 DIAGNOSIS — C3492 Malignant neoplasm of unspecified part of left bronchus or lung: Secondary | ICD-10-CM | POA: Diagnosis not present

## 2022-10-20 DIAGNOSIS — F17210 Nicotine dependence, cigarettes, uncomplicated: Secondary | ICD-10-CM | POA: Insufficient documentation

## 2022-10-20 DIAGNOSIS — D508 Other iron deficiency anemias: Secondary | ICD-10-CM | POA: Diagnosis not present

## 2022-10-20 DIAGNOSIS — C3432 Malignant neoplasm of lower lobe, left bronchus or lung: Secondary | ICD-10-CM | POA: Diagnosis not present

## 2022-10-20 DIAGNOSIS — D509 Iron deficiency anemia, unspecified: Secondary | ICD-10-CM | POA: Insufficient documentation

## 2022-10-20 NOTE — Patient Instructions (Addendum)
[facility] at [facility] Discharge Instructions   You were seen and examined today by Dr. Ellin Saba.  He reviewed the results of your lab work. Your iron is low. All other results are normal/stable.   He reviewed the results of your CT scan. It is showing the known spot has decreased in size. There is a new, very small nodule in the right lower lung. We will follow this closely.   We will see you back in 4 months. We will repeat lab work and a CT scan prior to this visit.   Return as scheduled.    Thank you for choosing [facility] at [facility] to provide your oncology and hematology care.  To afford each patient quality time with our provider, please arrive at least 15 minutes before your scheduled appointment time.   If you have a lab appointment with the Cancer Center please come in thru the Main Entrance and check in at the main information desk.  You need to re-schedule your appointment should you arrive 10 or more minutes late.  We strive to give you quality time with our providers, and arriving late affects you and other patients whose appointments are after yours.  Also, if you no show three or more times for appointments you may be dismissed from the clinic at the providers discretion.     Again, thank you for choosing [facility].  Our hope is that these requests will decrease the amount of time that you wait before being seen by our physicians.       _____________________________________________________________  Should you have questions after your visit to [facility], please contact our office at [phone] and follow the prompts.  Our office hours are 8:00 a.m. and 4:30 p.m. Monday - Friday.  Please note that voicemails left after 4:00 p.m. may not be returned until the following business day.  We are closed weekends and major holidays.  You do have access to a nurse 24-7, just call the main number to  the clinic [phone] and do not press any options, hold on the line and a nurse will answer the phone.    For prescription refill requests, have your pharmacy contact our office and allow 72 hours.    Due to Covid, you will need to wear a mask upon entering the hospital. If you do not have a mask, a mask will be given to you at the Main Entrance upon arrival. For doctor visits, patients may have 1 support person age 27 or older with them. For treatment visits, patients can not have anyone with them due to social distancing guidelines and our immunocompromised population.

## 2022-11-22 ENCOUNTER — Ambulatory Visit (INDEPENDENT_AMBULATORY_CARE_PROVIDER_SITE_OTHER): Payer: Medicare HMO | Admitting: Family Medicine

## 2022-11-22 ENCOUNTER — Encounter: Payer: Self-pay | Admitting: Family Medicine

## 2022-11-22 VITALS — BP 122/77 | HR 110 | Temp 99.7°F | Resp 16 | Ht 69.0 in | Wt 170.1 lb

## 2022-11-22 DIAGNOSIS — I10 Essential (primary) hypertension: Secondary | ICD-10-CM

## 2022-11-22 DIAGNOSIS — F17218 Nicotine dependence, cigarettes, with other nicotine-induced disorders: Secondary | ICD-10-CM | POA: Diagnosis not present

## 2022-11-22 DIAGNOSIS — F32A Depression, unspecified: Secondary | ICD-10-CM | POA: Diagnosis not present

## 2022-11-22 DIAGNOSIS — F419 Anxiety disorder, unspecified: Secondary | ICD-10-CM | POA: Diagnosis not present

## 2022-11-22 DIAGNOSIS — J0110 Acute frontal sinusitis, unspecified: Secondary | ICD-10-CM

## 2022-11-22 MED ORDER — CHLORPHENIRAMINE MALEATE 4 MG PO TABS: 4.0000 mg | ORAL_TABLET | Freq: Two times a day (BID) | ORAL | 0 refills | Status: DC | PRN

## 2022-11-22 MED ORDER — EZETIMIBE 10 MG PO TABS: 10.0000 mg | ORAL_TABLET | Freq: Every day | ORAL | 1 refills | Status: DC

## 2022-11-22 MED ORDER — AZITHROMYCIN 250 MG PO TABS: ORAL_TABLET | ORAL | 0 refills | Status: AC

## 2022-11-22 MED ORDER — BUSPIRONE HCL 5 MG PO TABS: 5.0000 mg | ORAL_TABLET | Freq: Three times a day (TID) | ORAL | 1 refills | Status: DC

## 2022-11-22 NOTE — Addendum Note (Signed)
Addended by: Abner Greenspan on: 11/22/2022 04:34 PM   Modules accepted: Orders

## 2022-11-22 NOTE — Patient Instructions (Addendum)
F/U in 4 months, call if you need me sooner  You are  trerated for acute suinusitis with Z pack and chlorpheniramine tablets , ensure you drinking a lot of water and get rest  Nurs e pls document flu vaccine received at previous visit  Anxiety medicatiion is doing very well, stay on that please  Continue to work on quiiting smoking which you need to do  Fasting lipid, cmp and EGFr and TSH 3 to 5 days before next appointment Thanks for choosing [facility], we consider it a privelige to serve you.

## 2022-11-25 ENCOUNTER — Institutional Professional Consult (permissible substitution): Payer: Self-pay | Admitting: Professional Counselor

## 2022-11-27 ENCOUNTER — Encounter: Payer: Self-pay | Admitting: Family Medicine

## 2022-11-27 DIAGNOSIS — J0110 Acute frontal sinusitis, unspecified: Secondary | ICD-10-CM | POA: Insufficient documentation

## 2022-11-27 NOTE — Assessment & Plan Note (Signed)
Asked:confirms currently smokes cigarettes 10/day Assess: Unwilling to set a quit date, but trying to cut back Advise: needs to QUIT to reduce risk of cancer,already has a dx of lung cancer, cardio and cerebrovascular disease Assist: counseled for 5 minutes and literature provided Arrange: follow up in 2 to 4 months

## 2022-11-27 NOTE — Progress Notes (Signed)
   ADISON BULLION     MRN: [id number]      DOB: 04-Jul-1955  Chief Complaint  Patient presents with   Nasal Congestion    Started yesterday, clear sinus drainage and headache    HPI [name] is here wit ha 5 day h/o increased nasal and head congestion , with increased post nasal drainage and hoarseness, had chills for 1 day Improved anxiety and acceptance of her husband's illness ROS Chronic chest congestion and cough unchanged, also hoarse voice . Denies chest pains, palpitations and leg swelling Denies abdominal pain, nausea, vomiting,diarrhea or constipation.   Denies dysuria, frequency, hesitancy or incontinence. Denies joint pain, swelling and limitation in mobility. Denies headaches, seizures, numbness, or tingling. Denies depression, anxiety or insomnia. Denies skin break down or rash.   PE  BP 122/77   Pulse (!) 110   Temp 99.7 F (37.6 C) (Oral)   Resp 16   Ht 5\' 9"  (1.753 m)   Wt 170 lb 1.9 oz (77.2 kg)   LMP  (LMP Unknown)   SpO2 94%   BMI 25.12 kg/m   Patient alert and oriented and in no cardiopulmonary distress.  HEENT: No facial asymmetry, EOMI,     Neck supple .Frontal sinus tenderness, cervical adenitis and nasal congestion  Chest: decreased air entry scattered crackles and wheezes CVS: S1, S2 no murmurs, no S3.Regular rate.  ABD: Soft non tender.   Ext: No edema  MS: Adequate ROM spine, shoulders, hips and knees.  Skin: Intact, no ulcerations or rash noted.  Psych: Good eye contact, normal affect. Memory intact not anxious or depressed appearing.  CNS: CN 2-12 intact, power,  normal throughout.no focal deficits noted.   Assessment & Plan  Acute frontal sinusitis Z pack and chlorpheniramine prescribed  Anxiety and depression Improved and better controlled on current med, no chnage  HTN (hypertension) Controlled, no change in medication DASH diet and commitment to daily physical activity for a minimum of 30 minutes discussed and  encouraged, as a part of hypertension management. The importance of attaining a healthy weight is also discussed.     11/22/2022    3:47 PM 10/20/2022   10:53 AM 09/27/2022    3:24 PM 06/15/2022    3:36 PM 06/09/2022    1:06 PM 05/30/2022    3:04 PM 03/29/2022    2:45 PM  BP/Weight  Systolic BP 122 125 123 143 138 132 138  Diastolic BP 77 75 77 86 80 69 86  Wt. (Lbs) 170.12 172.4 168.12 167.5 166.12 168.4 174.6  BMI 25.12 kg/m2 26.21 kg/m2 25.56 kg/m2 25.47 kg/m2 25.26 kg/m2 25.61 kg/m2 25.78 kg/m2    '  Nicotine dependence Asked:confirms currently smokes cigarettes 10/day Assess: Unwilling to set a quit date, but trying to cut back Advise: needs to QUIT to reduce risk of cancer,already has a dx of lung cancer, cardio and cerebrovascular disease Assist: counseled for 5 minutes and literature provided Arrange: follow up in 2 to 4 months

## 2022-11-27 NOTE — Assessment & Plan Note (Signed)
Improved and better controlled on current med, no chnage

## 2022-11-27 NOTE — Assessment & Plan Note (Signed)
Controlled, no change in medication DASH diet and commitment to daily physical activity for a minimum of 30 minutes discussed and encouraged, as a part of hypertension management. The importance of attaining a healthy weight is also discussed.     11/22/2022    3:47 PM 10/20/2022   10:53 AM 09/27/2022    3:24 PM 06/15/2022    3:36 PM 06/09/2022    1:06 PM 05/30/2022    3:04 PM 03/29/2022    2:45 PM  BP/Weight  Systolic BP 122 125 123 143 138 132 138  Diastolic BP 77 75 77 86 80 69 86  Wt. (Lbs) 170.12 172.4 168.12 167.5 166.12 168.4 174.6  BMI 25.12 kg/m2 26.21 kg/m2 25.56 kg/m2 25.47 kg/m2 25.26 kg/m2 25.61 kg/m2 25.78 kg/m2    '

## 2022-11-27 NOTE — Assessment & Plan Note (Signed)
Z pack and chlorpheniramine prescribed

## 2022-12-03 ENCOUNTER — Other Ambulatory Visit: Payer: Self-pay | Admitting: Internal Medicine

## 2022-12-03 ENCOUNTER — Other Ambulatory Visit: Payer: Self-pay | Admitting: Family Medicine

## 2022-12-03 DIAGNOSIS — F419 Anxiety disorder, unspecified: Secondary | ICD-10-CM

## 2022-12-30 ENCOUNTER — Institutional Professional Consult (permissible substitution): Payer: Medicare HMO | Admitting: Professional Counselor

## 2023-01-07 ENCOUNTER — Other Ambulatory Visit: Payer: Self-pay | Admitting: Family Medicine

## 2023-01-07 DIAGNOSIS — E782 Mixed hyperlipidemia: Secondary | ICD-10-CM

## 2023-02-22 ENCOUNTER — Other Ambulatory Visit: Payer: Self-pay | Admitting: Family Medicine

## 2023-02-22 DIAGNOSIS — Z9109 Other allergy status, other than to drugs and biological substances: Secondary | ICD-10-CM

## 2023-02-22 DIAGNOSIS — I10 Essential (primary) hypertension: Secondary | ICD-10-CM

## 2023-02-23 ENCOUNTER — Other Ambulatory Visit: Payer: Medicare HMO

## 2023-02-23 ENCOUNTER — Ambulatory Visit (HOSPITAL_COMMUNITY)
Admission: RE | Admit: 2023-02-23 | Discharge: 2023-02-23 | Disposition: A | Discharge status: Home / Self Care | Payer: Medicare HMO | Source: Ambulatory Visit | Attending: Hematology | Admitting: Hematology

## 2023-02-23 ENCOUNTER — Inpatient Hospital Stay: Payer: Medicare HMO | Attending: Hematology

## 2023-02-23 DIAGNOSIS — F17210 Nicotine dependence, cigarettes, uncomplicated: Secondary | ICD-10-CM | POA: Diagnosis not present

## 2023-02-23 DIAGNOSIS — C3492 Malignant neoplasm of unspecified part of left bronchus or lung: Secondary | ICD-10-CM

## 2023-02-23 DIAGNOSIS — D509 Iron deficiency anemia, unspecified: Secondary | ICD-10-CM | POA: Diagnosis not present

## 2023-02-23 DIAGNOSIS — Z923 Personal history of irradiation: Secondary | ICD-10-CM | POA: Diagnosis not present

## 2023-02-23 DIAGNOSIS — C3432 Malignant neoplasm of lower lobe, left bronchus or lung: Secondary | ICD-10-CM | POA: Diagnosis not present

## 2023-02-23 DIAGNOSIS — R590 Localized enlarged lymph nodes: Secondary | ICD-10-CM | POA: Diagnosis not present

## 2023-02-23 DIAGNOSIS — J432 Centrilobular emphysema: Secondary | ICD-10-CM | POA: Diagnosis not present

## 2023-02-23 DIAGNOSIS — Z85118 Personal history of other malignant neoplasm of bronchus and lung: Secondary | ICD-10-CM | POA: Diagnosis not present

## 2023-02-23 DIAGNOSIS — D508 Other iron deficiency anemias: Secondary | ICD-10-CM

## 2023-02-23 LAB — CBC WITH DIFFERENTIAL/PLATELET
Abs Immature Granulocytes: 0.05 10*3/uL (ref 0.00–0.07)
Basophils Absolute: 0 10*3/uL (ref 0.0–0.1)
Basophils Relative: 0 %
Eosinophils Absolute: 0.1 10*3/uL (ref 0.0–0.5)
Eosinophils Relative: 1 %
HCT: 42.7 % (ref 36.0–46.0)
Hemoglobin: 13.5 g/dL (ref 12.0–15.0)
Immature Granulocytes: 1 %
Lymphocytes Relative: 38 %
Lymphs Abs: 3.6 10*3/uL (ref 0.7–4.0)
MCH: 25.7 pg — ABNORMAL LOW (ref 26.0–34.0)
MCHC: 31.6 g/dL (ref 30.0–36.0)
MCV: 81.3 fL (ref 80.0–100.0)
Monocytes Absolute: 0.9 10*3/uL (ref 0.1–1.0)
Monocytes Relative: 9 %
Neutro Abs: 4.9 10*3/uL (ref 1.7–7.7)
Neutrophils Relative %: 51 %
Platelets: 327 10*3/uL (ref 150–400)
RBC: 5.25 MIL/uL — ABNORMAL HIGH (ref 3.87–5.11)
RDW: 16.1 % — ABNORMAL HIGH (ref 11.5–15.5)
WBC: 9.6 10*3/uL (ref 4.0–10.5)
nRBC: 0 % (ref 0.0–0.2)

## 2023-02-23 LAB — COMPREHENSIVE METABOLIC PANEL
ALT: 38 U/L (ref 0–44)
AST: 40 U/L (ref 15–41)
Albumin: 4.4 g/dL (ref 3.5–5.0)
Alkaline Phosphatase: 87 U/L (ref 38–126)
Anion gap: 10 (ref 5–15)
BUN: 8 mg/dL (ref 8–23)
CO2: 25 mmol/L (ref 22–32)
Calcium: 9.6 mg/dL (ref 8.9–10.3)
Chloride: 103 mmol/L (ref 98–111)
Creatinine, Ser: 0.94 mg/dL (ref 0.44–1.00)
GFR, Estimated: >60 mL/min (ref >60)
Glucose, Bld: 101 mg/dL — ABNORMAL HIGH (ref 70–99)
Potassium: 3.8 mmol/L (ref 3.5–5.1)
Sodium: 138 mmol/L (ref 135–145)
Total Bilirubin: 0.9 mg/dL (ref 0.0–1.2)
Total Protein: 7.8 g/dL (ref 6.5–8.1)

## 2023-02-23 LAB — IRON AND TIBC
Iron: 151 ug/dL (ref 28–170)
Saturation Ratios: 36 % — ABNORMAL HIGH (ref 10.4–31.8)
TIBC: 417 ug/dL (ref 250–450)
UIBC: 266 ug/dL

## 2023-02-23 LAB — FERRITIN: Ferritin: 53 ng/mL (ref 11–307)

## 2023-03-02 ENCOUNTER — Inpatient Hospital Stay (HOSPITAL_BASED_OUTPATIENT_CLINIC_OR_DEPARTMENT_OTHER): Payer: Medicare HMO | Admitting: Hematology

## 2023-03-02 VITALS — BP 132/64 | HR 66 | Temp 99.1°F | Resp 18 | Ht 68.0 in | Wt 155.0 lb

## 2023-03-02 DIAGNOSIS — F17210 Nicotine dependence, cigarettes, uncomplicated: Secondary | ICD-10-CM | POA: Diagnosis not present

## 2023-03-02 DIAGNOSIS — C3492 Malignant neoplasm of unspecified part of left bronchus or lung: Secondary | ICD-10-CM | POA: Diagnosis not present

## 2023-03-02 DIAGNOSIS — D508 Other iron deficiency anemias: Secondary | ICD-10-CM | POA: Diagnosis not present

## 2023-03-02 DIAGNOSIS — D509 Iron deficiency anemia, unspecified: Secondary | ICD-10-CM | POA: Diagnosis not present

## 2023-03-02 DIAGNOSIS — Z923 Personal history of irradiation: Secondary | ICD-10-CM | POA: Diagnosis not present

## 2023-03-02 DIAGNOSIS — C3432 Malignant neoplasm of lower lobe, left bronchus or lung: Secondary | ICD-10-CM | POA: Diagnosis not present

## 2023-03-02 NOTE — Patient Instructions (Addendum)
[facility] at [facility] Discharge Instructions   You were seen and examined today by Dr. Ellin Saba.  He reviewed the results of your lab work which are normal/stable.   He reviewed the results of your CT scan. It is showing that the spot in the right lung has grown slightly in size. We will just continue to monitor this at this time.   Continue iron pill once a day.   We will see you back in 4 months.  We will repeat a scan and lab work prior to this visit.   Return as scheduled.    Thank you for choosing [facility] at [facility] to provide your oncology and hematology care.  To afford each patient quality time with our provider, please arrive at least 15 minutes before your scheduled appointment time.   If you have a lab appointment with the Cancer Center please come in thru the Main Entrance and check in at the main information desk.  You need to re-schedule your appointment should you arrive 10 or more minutes late.  We strive to give you quality time with our providers, and arriving late affects you and other patients whose appointments are after yours.  Also, if you no show three or more times for appointments you may be dismissed from the clinic at the providers discretion.     Again, thank you for choosing [facility].  Our hope is that these requests will decrease the amount of time that you wait before being seen by our physicians.       _____________________________________________________________  Should you have questions after your visit to [facility], please contact our office at [phone] and follow the prompts.  Our office hours are 8:00 a.m. and 4:30 p.m. Monday - Friday.  Please note that voicemails left after 4:00 p.m. may not be returned until the following business day.  We are closed weekends and major holidays.  You do have access to a nurse 24-7, just call the main number to the clinic  [phone] and do not press any options, hold on the line and a nurse will answer the phone.    For prescription refill requests, have your pharmacy contact our office and allow 72 hours.    Due to Covid, you will need to wear a mask upon entering the hospital. If you do not have a mask, a mask will be given to you at the Main Entrance upon arrival. For doctor visits, patients may have 1 support person age 58 or older with them. For treatment visits, patients can not have anyone with them due to social distancing guidelines and our immunocompromised population.

## 2023-03-02 NOTE — Progress Notes (Signed)
[facility] [address] [place] [address]    Clinic Day:  03/02/2023  Referring physician: Kerri Perches, MD  Patient Care Team: Kerri Perches, MD as PCP - General Rothbart, Gerrit Friends, MD (Inactive) (Cardiology) Jena Gauss Gerrit Friends, MD (Gastroenterology) Lurena Nida, PTA as Physical Therapy Assistant (Physical Therapy) Newman Pies, MD as Consulting Physician (Otolaryngology)   ASSESSMENT & PLAN:   Assessment: 1.  Adenocarcinoma of left lower lobe of lung: -She is followed by Dr. Delton Coombes and left lower lobe lung nodule was being watched closely for the last few years. -She had increase in size of the left lower lobe lung nodule recently on CT scan. -PET scan on 12/09/2019 showed 2 cm left lower lobe pulmonary nodule with some solid elements with maximum SUV 4.7.  No adenopathy reported. -Bronchoscopy and biopsy of the left lower lobe nodule consistent with adenocarcinoma.  Left lower lobe brushings also positive for adenocarcinoma.  Left upper lobe brushings did not show any malignant cells. - XRT to the left and right lungs lesions from 11/24/2021 through 12/07/2021   2.  Social/family history: - Current active smoker, half pack per day since age 62. -Lives with husband and brother at home.  Activity is limited secondary to back and hip pain. -Brother had throat cancer and paternal cousin had throat cancer.    Plan: 1.  Clinical stage I adenocarcinoma of left lower lobe of lung: - She is continuing to smoke 1 pack of cigarettes daily.  Denies any hemoptysis.  She reports occasional left chest wall burning sensation at the site of radiation. - Labs from 02/23/2023: Normal LFTs.  CBC grossly normal. - CT chest (02/22/2022): Treated left lower lobe pulmonary nodule is stable to slightly decreased.  Spiculated nodule in the right upper lobe slightly increased in size compared to prior study measuring 8 x 7 mm previously 7 x 5 mm.  Multiple other lung nodules  are stable. - We discussed smoking cessation and different modalities available.  She reports that nicotine patches and varenicline pills made her crave cigarettes more. - Given the slightly increased right upper lobe lung nodule, I have recommended close follow-up with CT chest with contrast in 4 months.   2.  Microcytic anemia: - Continue iron tablet daily.  Ferritin improved to 53.  Hemoglobin is 13.5.  Will repeat ferritin and iron panel in 4 months.    Orders Placed This Encounter  Procedures   CT CHEST W CONTRAST    Standing Status:   Future    Expected Date:   05/30/2023    Expiration Date:   03/01/2024    If indicated for the ordered procedure, I authorize the administration of contrast media per Radiology protocol:   Yes    Does the patient have a contrast media/X-ray dye allergy?:   No    Preferred imaging location?:   [facility]   CBC with Differential    Standing Status:   Future    Expected Date:   06/23/2023    Expiration Date:   03/01/2024   Comprehensive metabolic panel    Standing Status:   Future    Expected Date:   06/23/2023    Expiration Date:   03/01/2024   Iron and TIBC ([facility] ONLY)    Standing Status:   Future    Expected Date:   06/23/2023    Expiration Date:   03/01/2024   Ferritin    Standing Status:  Future    Expected Date:   06/23/2023    Expiration Date:   03/01/2024      Alben Deeds Teague,acting as a scribe for Doreatha Massed, MD.,have documented all relevant documentation on the behalf of Doreatha Massed, MD,as directed by  Doreatha Massed, MD while in the presence of Doreatha Massed, MD.  I, Doreatha Massed MD, have reviewed the above documentation for accuracy and completeness, and I agree with the above.     Doreatha Massed, MD   2/13/20256:20 PM  CHIEF COMPLAINT:   Diagnosis: left lung cancer    Cancer Staging  No matching staging information was found for the patient.    Prior  Therapy: XRT to bilateral lung lesions, 11/24/21 - 12/07/21  Current Therapy:  surveillance   HISTORY OF PRESENT ILLNESS:   Oncology History   No history exists.     INTERVAL HISTORY:   Kelly Douglas is a 68 y.o. female presenting to clinic today for follow up of left lung cancer. She was last seen by me on 10/20/22.  Since her last visit, she underwent surveillance chest CT on 02/23/23 that found: Treated left lower lobe pulmonary nodule is stable to slightly decreased in size compared to the prior study, with evolving surrounding postradiation changes. However, there is a spiculated nodule in the right upper lobe which is slightly increased in size compared to the prior study, currently measuring 8 x 7 mm (previously 7 x 5 mm), concerning for potential neoplasm. Multiple other pulmonary nodules are stable compared to the prior study, as above. Aortic atherosclerosis, in addition to left main and three-vessel coronary artery disease. Diffuse bronchial wall thickening with mild centrilobular and paraseptal emphysema; imaging findings suggestive of underlying COPD.  Today, she states that she is doing well overall. Her appetite level is at 65%. Her energy level is at 50%. She is accompanied by a family member. She denies any recent infections. Kelly Douglas notes since finishing XRT treatment, she feels a burning sensation along the left chest wall. She is currently smoking 1 ppd, and has attempted to quit smoking through nicotine patches and Varenicline pills, that made her crave cigarettes. [name] is taking iron tablets daily.  PAST MEDICAL HISTORY:   Past Medical History: Past Medical History:  Diagnosis Date   Anxiety    Arteriosclerotic cardiovascular disease (ASCVD) 2013   coronary calcification-left main, LAD and CX; small pericardial effusion   Arthritis    hips   Cancer (HCC)    Lung Cancer    Chronic back pain    Chronic bronchitis    Chronic lung disease    Chronic scarring and volume  loss-left lung; characteristics of a chronic infectious process-possible MAI   Depression    Dyspnea    occasional    GERD (gastroesophageal reflux disease)    Dr Jena Gauss EGD 09/2009->esophagitis, sm HH, antral erosions, atonic esophagus   History of radiation therapy    Left and Right Lung- 11/24/21-12/07/21- Dr. Antony Blackbird   Hyperlipidemia    Hypertension    Hypothyroid 1981 approx   Scleroderma (HCC)    Seasonal allergies    Syncope    Multiple spells over the past 40+ years, likely neurocardiogenic   Tobacco abuse 06/24/2009    Surgical History: Past Surgical History:  Procedure Laterality Date   BILATERAL SALPINGOOPHORECTOMY  2013    Dr. Despina Hidden; uterus remains in situ   BRONCHIAL BIOPSY  11/21/2019   Procedure: BRONCHIAL BIOPSIES;  Surgeon: Leslye Peer, MD;  Location: [facility] ENDOSCOPY;  Service: Pulmonary;;   BRONCHIAL BRUSHINGS  11/21/2019   Procedure: BRONCHIAL BRUSHINGS;  Surgeon: Leslye Peer, MD;  Location: [facility] ENDOSCOPY;  Service: Pulmonary;;   BRONCHIAL NEEDLE ASPIRATION BIOPSY  11/21/2019   Procedure: BRONCHIAL NEEDLE ASPIRATION BIOPSIES;  Surgeon: Leslye Peer, MD;  Location: [facility] ENDOSCOPY;  Service: Pulmonary;;   BRONCHIAL WASHINGS  11/21/2019   Procedure: BRONCHIAL WASHINGS;  Surgeon: Leslye Peer, MD;  Location: [facility] ENDOSCOPY;  Service: Pulmonary;;   COLONOSCOPY  09/28/09   anal papilla otherwise normal   COLONOSCOPY N/A 08/29/2012   ZOX:WRUEAVW polyp-removed as described above. tubular adenoma   COLONOSCOPY WITH PROPOFOL N/A 11/11/2019   Procedure: COLONOSCOPY WITH PROPOFOL;  Surgeon: Corbin Ade, MD;  Location: [facility] ENDO SUITE;  Service: Endoscopy;  Laterality: N/A;  2:30pm   ESOPHAGOGASTRODUODENOSCOPY  10/07/09   Dr. Harless Nakayama of esophageal mucosa, diffusely ?esophagitis (bx benign), small HH/antral erosions, erythema bx benign. 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 Hiatal hernia   FUDUCIAL PLACEMENT  10/18/2017   Procedure: PLACEMENT OF 3 FUDUCIAL INTO LEFT LOWER LOBE OF LUNG-TARGET 1;  Surgeon: Leslye Peer, MD;  Location: [facility] OR;  Service: Thoracic;;   LAPAROSCOPIC APPENDECTOMY  07/16/2011   Procedure: APPENDECTOMY LAPAROSCOPIC;  Surgeon: Fabio Bering, MD;  Location: [facility] ORS;  Service: General;  Laterality: N/A;   POLYPECTOMY  11/11/2019   Procedure: POLYPECTOMY;  Surgeon: Corbin Ade, MD;  Location: [facility] ENDO SUITE;  Service: Endoscopy;;   TUBAL LIGATION     VIDEO BRONCHOSCOPY  10/18/2017   VIDEO BRONCHOSCOPY WITH ENDOBRONCHIAL NAVIGATION N/A 10/18/2017   Procedure: VIDEO BRONCHOSCOPY WITH ENDOBRONCHIAL NAVIGATION;  Surgeon: Leslye Peer, MD;  Location: [facility] OR;  Service: Thoracic;  Laterality: N/A;   VIDEO BRONCHOSCOPY WITH ENDOBRONCHIAL NAVIGATION Left 11/21/2019   Procedure: VIDEO BRONCHOSCOPY WITH ENDOBRONCHIAL NAVIGATION;  Surgeon: Leslye Peer, MD;  Location: [facility] ENDOSCOPY;  Service: Pulmonary;  Laterality: Left;    Social History: Social History   Socioeconomic History   Marital status: Married    Spouse name: Not on file   Number of children: 2   Years of education: Not on file   Highest education level: Not on file  Occupational History   Occupation: disabled    Employer: NOT EMPLOYED  Tobacco Use   Smoking status: Every Day    Current packs/day: 0.50    Average packs/day: 0.5 packs/day for 51.6 years (25.8 ttl pk-yrs)    Types: Cigarettes    Start date: 07/14/1971   Smokeless tobacco: Never   Tobacco comments:    0.5 packs of cigarettes smoked daily ARJ 12/31/19  Vaping Use   Vaping status: Never Used  Substance and Sexual Activity   Alcohol use: Not Currently    Alcohol/week: 0.0 standard drinks of alcohol    Comment: quit June 2012-used to drink 6 pack daily, occasional beer per pt 11/20/19   Drug use: No   Sexual  activity: Yes    Birth control/protection: Post-menopausal  Other Topics Concern   Not on file  Social History Narrative   Not on file   Social Drivers of Health   Financial Resource Strain: Low Risk  (11/25/2020)   Overall Financial Resource Strain (CARDIA)    Difficulty of Paying Living Expenses: Not hard at all  Food Insecurity: No Food Insecurity (11/25/2020)   Hunger Vital Sign    Worried About Radiation protection practitioner of Food  in the Last Year: Never true    Ran Out of Food in the Last Year: Never true  Transportation Needs: No Transportation Needs (11/25/2020)   PRAPARE - Administrator, Civil Service (Medical): No    Lack of Transportation (Non-Medical): No  Physical Activity: Insufficiently Active (11/25/2020)   Exercise Vital Sign    Days of Exercise per Week: 3 days    Minutes of Exercise per Session: 30 min  Stress: No Stress Concern Present (11/25/2020)   [employer] of Occupational Health - Occupational Stress Questionnaire    Feeling of Stress : Not at all  Social Connections: Moderately Integrated (11/25/2020)   Social Connection and Isolation Panel [NHANES]    Frequency of Communication with Friends and Family: Three times a week    Frequency of Social Gatherings with Friends and Family: Once a week    Attends Religious Services: More than 4 times per year    Active Member of [employer] or Organizations: No    Attends Banker Meetings: Never    Marital Status: Married  Catering manager Violence: Not At Risk (11/25/2020)   Humiliation, Afraid, Rape, and Kick questionnaire    Fear of Current or Ex-Partner: No    Emotionally Abused: No    Physically Abused: No    Sexually Abused: No    Family History: Family History  Problem Relation Age of Onset   Aneurysm Mother    Stroke Father    Aneurysm Father    Hypertension Sister    Cancer Brother        throat cancer   Coronary artery disease Brother    Hypertension Brother    Down syndrome Brother     Hypertension Brother    Hypertension Brother        father/mother   Throat cancer Brother 49       throat cancer   Diabetes Brother    Diabetes Brother    Anesthesia problems Neg Hx    Hypotension Neg Hx    Malignant hyperthermia Neg Hx    Pseudochol deficiency Neg Hx    Colon cancer Neg Hx     Current Medications:  Current Outpatient Medications:    acetaminophen (TYLENOL) 650 MG CR tablet, Take 1,300 mg by mouth every 8 (eight) hours as needed for pain. , Disp: , Rfl:    albuterol (VENTOLIN HFA) 108 (90 Base) MCG/ACT inhaler, Inhale 1-2 puffs into the lungs every 6 (six) hours as needed for wheezing or shortness of breath., Disp: 18 each, Rfl: 1   amLODipine (NORVASC) 5 MG tablet, TAKE 1 TABLET EVERY DAY, Disp: 90 tablet, Rfl: 3   atorvastatin (LIPITOR) 10 MG tablet, TAKE 1 TABLET EVERY DAY, Disp: 90 tablet, Rfl: 3   busPIRone (BUSPAR) 5 MG tablet, Take 1 tablet (5 mg total) by mouth 3 (three) times daily., Disp: 270 tablet, Rfl: 1   chlorpheniramine (CHLOR-TRIMETON) 4 MG tablet, Take 1 tablet (4 mg total) by mouth 2 (two) times daily as needed for allergies., Disp: 14 tablet, Rfl: 0   Cholecalciferol (VITAMIN D) 125 MCG (5000 UT) CAPS, Take 5,000 Units by mouth daily. , Disp: , Rfl:    DULoxetine (CYMBALTA) 60 MG capsule, TAKE 1 CAPSULE EVERY DAY, Disp: 90 capsule, Rfl: 3   ezetimibe (ZETIA) 10 MG tablet, Take 1 tablet (10 mg total) by mouth daily., Disp: 90 tablet, Rfl: 1   famotidine (PEPCID) 20 MG tablet, TAKE 1 TABLET EVERY DAY, Disp: 90 tablet, Rfl: 3  ferrous sulfate 325 (65 FE) MG EC tablet, Take 325 mg by mouth daily with breakfast., Disp: , Rfl:    hydrOXYzine (VISTARIL) 50 MG capsule, TAKE 1 TO 2 CAPSULES AT BEDTIME FOR SLEEP, Disp: 180 capsule, Rfl: 3   levothyroxine (SYNTHROID) 75 MCG tablet, TAKE 1 TABLET EVERY OTHER DAY ALTERNATING WITH 1/2 TABLET EVERY OTHER DAY, Disp: 66 tablet, Rfl: 3   magnesium oxide (MAG-OX) 400 (240 Mg) MG tablet, TAKE 1 TABLET THREE TIMES  DAILY, Disp: 270 tablet, Rfl: 2   meloxicam (MOBIC) 7.5 MG tablet, TAKE 1 TABLET EVERY DAY, Disp: 90 tablet, Rfl: 3   mirtazapine (REMERON) 7.5 MG tablet, TAKE 1 TABLET AT BEDTIME, Disp: 90 tablet, Rfl: 3   montelukast (SINGULAIR) 10 MG tablet, TAKE 1 TABLET AT BEDTIME, Disp: 90 tablet, Rfl: 3   Multiple Vitamin (MULTIVITAMIN WITH MINERALS) TABS tablet, Take 1 tablet by mouth daily., Disp: , Rfl:    pantoprazole (PROTONIX) 40 MG tablet, TAKE 1 TABLET TWICE DAILY, Disp: 180 tablet, Rfl: 3   Allergies: Allergies  Allergen Reactions   Aspirin Other (See Comments)    Nose bleeds    REVIEW OF SYSTEMS:   Review of Systems  Constitutional:  Negative for chills, fatigue and fever.  HENT:   Negative for lump/mass, mouth sores, nosebleeds, sore throat and trouble swallowing.   Eyes:  Negative for eye problems.  Respiratory:  Positive for cough. Negative for shortness of breath.   Cardiovascular:  Negative for chest pain, leg swelling and palpitations.  Gastrointestinal:  Positive for constipation. Negative for abdominal pain, diarrhea, nausea and vomiting.  Genitourinary:  Negative for bladder incontinence, difficulty urinating, dysuria, frequency, hematuria and nocturia.   Musculoskeletal:  Positive for flank pain (left, 5/10 severity). Negative for arthralgias, back pain, myalgias and neck pain.  Skin:  Negative for itching and rash.  Neurological:  Negative for dizziness, headaches and numbness.  Hematological:  Does not bruise/bleed easily.  Psychiatric/Behavioral:  Negative for depression, sleep disturbance and suicidal ideas. The patient is not nervous/anxious.   All other systems reviewed and are negative.    VITALS:   Blood pressure 132/64, pulse 66, temperature 99.1 F (37.3 C), temperature source Tympanic, resp. rate 18, height 5\' 8"  (1.727 m), weight 155 lb (70.3 kg), SpO2 100%.  Wt Readings from Last 3 Encounters:  03/02/23 155 lb (70.3 kg)  11/22/22 170 lb 1.9 oz (77.2 kg)   10/20/22 172 lb 6.4 oz (78.2 kg)    Body mass index is 23.57 kg/m.  Performance status (ECOG): 1 - Symptomatic but completely ambulatory  PHYSICAL EXAM:   Physical Exam Vitals and nursing note reviewed. Exam conducted with a chaperone present.  Constitutional:      Appearance: Normal appearance.  Cardiovascular:     Rate and Rhythm: Normal rate and regular rhythm.     Pulses: Normal pulses.     Heart sounds: Normal heart sounds.  Pulmonary:     Effort: Pulmonary effort is normal.     Breath sounds: Normal breath sounds.  Abdominal:     Palpations: Abdomen is soft. There is no hepatomegaly, splenomegaly or mass.     Tenderness: There is no abdominal tenderness.  Musculoskeletal:     Right lower leg: No edema.     Left lower leg: No edema.  Lymphadenopathy:     Cervical: No cervical adenopathy.     Right cervical: No superficial, deep or posterior cervical adenopathy.    Left cervical: No superficial, deep or posterior cervical adenopathy.  Upper Body:     Right upper body: No supraclavicular or axillary adenopathy.     Left upper body: No supraclavicular or axillary adenopathy.  Neurological:     General: No focal deficit present.     Mental Status: She is alert and oriented to person, place, and time.  Psychiatric:        Mood and Affect: Mood normal.        Behavior: Behavior normal.     LABS:      Latest Ref Rng & Units 02/23/2023    9:35 AM 10/13/2022    3:18 PM 06/03/2022    2:08 PM  CBC  WBC 4.0 - 10.5 K/uL 9.6  12.0  9.9   Hemoglobin 12.0 - 15.0 g/dL 16.1  09.6  04.5   Hematocrit 36.0 - 46.0 % 42.7  40.0  38.6   Platelets 150 - 400 K/uL 327  346  374       Latest Ref Rng & Units 02/23/2023    9:35 AM 09/27/2022    4:23 PM 06/09/2022    1:55 PM  CMP  Glucose 70 - 99 mg/dL 409  94  81   BUN 8 - 23 mg/dL 8  17  11    Creatinine 0.44 - 1.00 mg/dL 8.11  9.14  7.82   Sodium 135 - 145 mmol/L 138  142  140   Potassium 3.5 - 5.1 mmol/L 3.8  4.1  4.7    Chloride 98 - 111 mmol/L 103  106  102   CO2 22 - 32 mmol/L 25  20  23    Calcium 8.9 - 10.3 mg/dL 9.6  95.6  21.3   Total Protein 6.5 - 8.1 g/dL 7.8     Total Bilirubin 0.0 - 1.2 mg/dL 0.9     Alkaline Phos 38 - 126 U/L 87     AST 15 - 41 U/L 40     ALT 0 - 44 U/L 38        No results found for: "CEA1", "CEA" / No results found for: "CEA1", "CEA" No results found for: "PSA1" No results found for: "YQM578" No results found for: "CAN125"  No results found for: "TOTALPROTELP", "ALBUMINELP", "A1GS", "A2GS", "BETS", "BETA2SER", "GAMS", "MSPIKE", "SPEI" Lab Results  Component Value Date   TIBC 417 02/23/2023   TIBC 465 (H) 10/13/2022   TIBC 437 06/03/2022   FERRITIN 53 02/23/2023   FERRITIN 18 10/13/2022   FERRITIN 14 06/03/2022   IRONPCTSAT 36 (H) 02/23/2023   IRONPCTSAT 14 10/13/2022   IRONPCTSAT 15 06/03/2022   No results found for: "LDH"   STUDIES:   CT Chest Wo Contrast Result Date: 03/02/2023 CLINICAL DATA:  68 year old female with history of non-small cell lung cancer (adenocarcinoma of the lung). Follow-up study. EXAM: CT CHEST WITHOUT CONTRAST TECHNIQUE: Multidetector CT imaging of the chest was performed following the standard protocol without IV contrast. RADIATION DOSE REDUCTION: This exam was performed according to the departmental dose-optimization program which includes automated exposure control, adjustment of the mA and/or kV according to patient size and/or use of iterative reconstruction technique. COMPARISON:  Chest CT 10/13/2022. FINDINGS: Cardiovascular: Heart size is normal. Small amount of pericardial fluid and/or thickening, unlikely to be of any hemodynamic significance at this time, and unchanged compared to the prior study. No pericardial calcification. There is aortic atherosclerosis, as well as atherosclerosis of the great vessels of the mediastinum and the coronary arteries, including calcified atherosclerotic plaque in the left main, left anterior  descending,  left circumflex and right coronary arteries. Mediastinum/Nodes: No pathologically enlarged mediastinal or hilar lymph nodes. Please note that accurate exclusion of hilar adenopathy is limited on noncontrast CT scans. Patulous esophagus. No axillary lymphadenopathy. Lungs/Pleura: Treated left lower lobe pulmonary nodule (axial image 101 of series 3) measures 1.3 x 1.4 cm, similar to slightly decreased in size compared to the prior examination, with extensive surrounding ground-glass attenuation, septal thickening and regional architectural distortion, most compatible with evolving postradiation changes. Multiple other small pulmonary nodules are noted, including an 8 x 7 mm spiculated nodule in the left upper lobe (axial image 66 of series 3), which is unchanged. 8 x 7 mm spiculated nodule in the right upper lobe (axial image 48 of series 3) slightly increased from 7 x 5 mm on the prior study, suspicious. 10 mm ground-glass attenuation right upper lobe pulmonary nodule (axial image 71 of series 3, stable. No other new or clearly enlarging suspicious appearing pulmonary nodules or masses are noted. No acute consolidative airspace disease. Chronic postradiation changes are also noted in the left upper lobe with extensive pleural-parenchymal scarring in the apex. No pleural effusions. Mild diffuse bronchial wall thickening with mild centrilobular and paraseptal emphysema. Upper Abdomen: Aortic atherosclerosis. Musculoskeletal: There are no aggressive appearing lytic or blastic lesions noted in the visualized portions of the skeleton. IMPRESSION: 1. Treated left lower lobe pulmonary nodule is stable to slightly decreased in size compared to the prior study, with evolving surrounding postradiation changes. 2. However, there is a spiculated nodule in the right upper lobe which is slightly increased in size compared to the prior study, currently measuring 8 x 7 mm (previously 7 x 5 mm), concerning for potential  neoplasm. 3. Multiple other pulmonary nodules are stable compared to the prior study, as above. 4. Aortic atherosclerosis, in addition to left main and three-vessel coronary artery disease. 5. Diffuse bronchial wall thickening with mild centrilobular and paraseptal emphysema; imaging findings suggestive of underlying COPD. Aortic Atherosclerosis (ICD10-I70.0) and Emphysema (ICD10-J43.9). Electronically Signed   By: Trudie Reed M.D.   On: 03/02/2023 08:55

## 2023-03-22 ENCOUNTER — Telehealth: Payer: Medicare HMO | Admitting: Family Medicine

## 2023-05-19 ENCOUNTER — Ambulatory Visit: Payer: Self-pay

## 2023-05-19 NOTE — Telephone Encounter (Signed)
.  Copied from CRM [id number]. Topic: Clinical - Red Word Triage >> May 19, 2023  2:31 PM DeAngela L wrote: Red Word that prompted transfer to Nurse Triage: Patient has swelling of her feet and ankle, the right side is more painful burning sensation Reason for Disposition  [1] SEVERE pain (e.g., excruciating, unable to do any normal activities) AND [2] not improved after 2 hours of pain medicine  Answer Assessment - Initial Assessment Questions 1. ONSET: "When did the pain start?"      *No Answer* 2. LOCATION: "Where is the pain located?"      Right foot worse than left side 3. PAIN: "How bad is the pain?"    (Scale 1-10; or mild, moderate, severe)  - MILD (1-3): doesn't interfere with normal activities.   - MODERATE (4-7): interferes with normal activities (e.g., work or school) or awakens from sleep, limping.   - SEVERE (8-10): excruciating pain, unable to do any normal activities, unable to walk.      moderate 4. WORK OR EXERCISE: "Has there been any recent work or exercise that involved this part of the body?"      Taking care of sister 5. CAUSE: "What do you think is causing the foot pain?"     Unsure  6. OTHER SYMPTOMS: "Do you have any other symptoms?" (e.g., leg pain, rash, fever, numbness)     Painful burning - sharp pain to foot and darkness to ankle  Protocols used: Foot Pain-A-AH

## 2023-05-22 ENCOUNTER — Ambulatory Visit (INDEPENDENT_AMBULATORY_CARE_PROVIDER_SITE_OTHER): Payer: Self-pay

## 2023-05-22 VITALS — BP 138/80 | HR 101 | Ht 68.0 in | Wt 171.1 lb

## 2023-05-22 DIAGNOSIS — M79671 Pain in right foot: Secondary | ICD-10-CM | POA: Diagnosis not present

## 2023-05-22 DIAGNOSIS — M79672 Pain in left foot: Secondary | ICD-10-CM

## 2023-05-22 DIAGNOSIS — R600 Localized edema: Secondary | ICD-10-CM

## 2023-05-22 MED ORDER — HYDROCHLOROTHIAZIDE 12.5 MG PO TABS: 12.5000 mg | ORAL_TABLET | Freq: Every day | ORAL | 0 refills | Status: DC

## 2023-05-22 NOTE — Telephone Encounter (Signed)
 Kenzie informed. She will let pt know at appt today

## 2023-05-22 NOTE — Progress Notes (Unsigned)
   Acute Office Visit  Subjective:     Patient ID: Kelly Douglas, female    DOB: 10-21-1955, 68 y.o.   MRN: [id number]  Chief Complaint  Patient presents with   Medical Management of Chronic Issues    Pt states foot and ankle     HPI Patient is in today for bilat foot pain and swelling.    Review of Systems  Constitutional: Negative.   Eyes: Negative.   Cardiovascular:  Positive for leg swelling.  Musculoskeletal:  Positive for myalgias (bilat foot pain).  Skin: Negative.   Neurological: Negative.   Psychiatric/Behavioral: Negative.          Objective:    BP 138/80   Pulse (!) 101   Ht 5\' 8"  (1.727 m)   Wt 171 lb 1.9 oz (77.6 kg)   LMP  (LMP Unknown)   SpO2 93%   BMI 26.02 kg/m    Physical Exam Vitals and nursing note reviewed.  Constitutional:      Appearance: Normal appearance.  Eyes:     Extraocular Movements: Extraocular movements intact.     Pupils: Pupils are equal, round, and reactive to light.  Musculoskeletal:     Right lower leg: Edema present.     Left lower leg: Edema present.     Right ankle: Swelling present.     Left ankle: Swelling present.     Right foot: Decreased range of motion. Swelling and tenderness present.     Left foot: Decreased range of motion. Swelling and tenderness present.  Neurological:     Mental Status: She is alert and oriented to person, place, and time.  Psychiatric:        Mood and Affect: Mood normal.        Thought Content: Thought content normal.     Results for orders placed or performed in visit on 05/22/23  Uric acid  Result Value Ref Range   Uric Acid 5.4 3.0 - 7.2 mg/dL        Assessment & Plan:   Problem List Items Addressed This Visit   None Visit Diagnoses       Bilateral foot pain    -  Primary   check uric acid level.  recommend NSAIDS as directed for pain relief.   Relevant Orders   Uric acid (Completed)     Bilateral edema of lower extremity       add hctz for ongoing edema.    Relevant Medications   hydrochlorothiazide (HYDRODIURIL) 12.5 MG tablet       Meds ordered this encounter  Medications   hydrochlorothiazide (HYDRODIURIL) 12.5 MG tablet    Sig: Take 1 tablet (12.5 mg total) by mouth daily for 14 days.    Dispense:  14 tablet    Refill:  0    No follow-ups on file.  Alison Irvine, FNP

## 2023-05-23 LAB — URIC ACID: Uric Acid: 5.4 mg/dL (ref 3.0–7.2)

## 2023-06-02 ENCOUNTER — Ambulatory Visit: Payer: Self-pay | Admitting: *Deleted

## 2023-06-02 NOTE — Telephone Encounter (Signed)
 Copied from CRM [id number]. Topic: Clinical - Red Word Triage >> Jun 02, 2023  3:04 PM Opal Bill wrote: Red Word that prompted transfer to Nurse Triage: Feet swollen and lightheaded, pain in feet. Pt says it helps when she has her magnesium  oxide (MAG-OX) 400 (240 Mg) MG tablet and potassium chloride  SA (KLOR-CON ) CR tablet 40 mEq. Reason for Disposition  [1] MILD swelling of both ankles (i.e., pedal edema) AND [2] new-onset or worsening  Answer Assessment - Initial Assessment Questions 1. ONSET: "When did the swelling start?" (e.g., minutes, hours, days)     I'm having swelling and pain in my feet for over a month.  I saw the NP and she gave me pills for water  but they have not helped much.   I have 2 more pills.  They were for 14 days.    May 5th I saw her.   Dr. Rodolph Clap. 2. LOCATION: "What part of the leg is swollen?"  "Are both legs swollen or just one leg?"     Both feet and ankles too.   It started thighs hurting.   They still hurt at times. 3. SEVERITY: "How bad is the swelling?" (e.g., localized; mild, moderate, severe)   - Localized: Small area of swelling localized to one leg.   - MILD pedal edema: Swelling limited to foot and ankle, pitting edema < 1/4 inch (6 mm) deep, rest and elevation eliminate most or all swelling.   - MODERATE edema: Swelling of lower leg to knee, pitting edema > 1/4 inch (6 mm) deep, rest and elevation only partially reduce swelling.   - SEVERE edema: Swelling extends above knee, facial or hand swelling present.      I can't see my ankles bones due to the the swelling.   When I press on the swelling it does not go it.  (Pit).   I'm having pain in right foot.   Toes on both feet are numb been going on for a while.   It's intermittent numbness.   No diabetes. 4. REDNESS: "Does the swelling look red or infected?"     A little red at times.   I are mostly dark colored. 5. PAIN: "Is the swelling painful to touch?" If Yes, ask: "How painful is it?"   (Scale 1-10; mild,  moderate or severe)     Yes 6. FEVER: "Do you have a fever?" If Yes, ask: "What is it, how was it measured, and when did it start?"      Not asked 7. CAUSE: "What do you think is causing the leg swelling?"     No more than usual the shortness of breath.   Sometimes I feel like my rib cage hurts.   I have to change my position and it goes away. 8. MEDICAL HISTORY: "Do you have a history of blood clots (e.g., DVT), cancer, heart failure, kidney disease, or liver failure?"     No blood clots in legs, no kidney disease.    9. RECURRENT SYMPTOM: "Have you had leg swelling before?" If Yes, ask: "When was the last time?" "What happened that time?"     Yes I've had swelling before. 10. OTHER SYMPTOMS: "Do you have any other symptoms?" (e.g., chest pain, difficulty breathing)       Shortness of breath but it's my usual.   I'm not more short of breath. 11. PREGNANCY: "Is there any chance you are pregnant?" "When was your last menstrual period?"       N/A  due to age  Protocols used: Leg Swelling and Edema-A-AH  Chief Complaint: Bilateral feet/ankle swelling   Seen on 05/22/2023 and given hydrochlorothiazide  to take for 14 days by Alison Irvine, FNP.   "They haven't helped any".    Symptoms: Shortness of breath but it's not usual.   Not worse.   Denies chest pain.   Has lightheadedness at times. Frequency: Since seen on 05/22/2023.   "I'm not any better even after taking the fluid pills".    Pertinent Negatives: Patient denies chest pain or worsening shortness of breath Disposition: [] ED /[] Urgent Care (no appt availability in office) / [x] Appointment(In office/virtual)/ []  [facility]/ [] Home Care/ [] Refused Recommended Disposition /[] [place] [facility]/ []  Follow-up with PCP Additional Notes: Pt only wanted to see Dr. Rodolph Clap.   I let her know her appt was a month away on 07/04/2023 at 9:00.    Pt still insisted she only wanted to see Dr. Rodolph Clap.   I instructed her to go to the ED if her  symptoms became worse:   increasing shortness of breath, chest discomfort, increased swelling of feet/ankles, dizziness.   Pt verbalized understanding and was agreeable to going to ED if needed.

## 2023-06-05 NOTE — Telephone Encounter (Signed)
 LVM for patient to call office if tomorrow 4pm does not work  Appt scheduled ,

## 2023-06-06 ENCOUNTER — Ambulatory Visit: Admitting: Family Medicine

## 2023-06-12 ENCOUNTER — Encounter (HOSPITAL_COMMUNITY): Payer: Self-pay | Admitting: *Deleted

## 2023-06-12 ENCOUNTER — Emergency Department (HOSPITAL_COMMUNITY)
Admission: EM | Admit: 2023-06-12 | Discharge: 2023-06-12 | Disposition: A | Discharge status: Home / Self Care | Attending: Emergency Medicine | Admitting: Emergency Medicine

## 2023-06-12 ENCOUNTER — Emergency Department (HOSPITAL_COMMUNITY)

## 2023-06-12 ENCOUNTER — Other Ambulatory Visit: Payer: Self-pay

## 2023-06-12 DIAGNOSIS — R0789 Other chest pain: Secondary | ICD-10-CM | POA: Insufficient documentation

## 2023-06-12 DIAGNOSIS — Z720 Tobacco use: Secondary | ICD-10-CM | POA: Diagnosis not present

## 2023-06-12 DIAGNOSIS — R0602 Shortness of breath: Secondary | ICD-10-CM | POA: Diagnosis not present

## 2023-06-12 DIAGNOSIS — Z7951 Long term (current) use of inhaled steroids: Secondary | ICD-10-CM | POA: Insufficient documentation

## 2023-06-12 DIAGNOSIS — M79604 Pain in right leg: Secondary | ICD-10-CM | POA: Diagnosis not present

## 2023-06-12 DIAGNOSIS — K2289 Other specified disease of esophagus: Secondary | ICD-10-CM | POA: Diagnosis not present

## 2023-06-12 DIAGNOSIS — M25551 Pain in right hip: Secondary | ICD-10-CM | POA: Insufficient documentation

## 2023-06-12 DIAGNOSIS — M25571 Pain in right ankle and joints of right foot: Secondary | ICD-10-CM | POA: Insufficient documentation

## 2023-06-12 DIAGNOSIS — M25572 Pain in left ankle and joints of left foot: Secondary | ICD-10-CM | POA: Insufficient documentation

## 2023-06-12 DIAGNOSIS — M25552 Pain in left hip: Secondary | ICD-10-CM | POA: Diagnosis not present

## 2023-06-12 DIAGNOSIS — M545 Low back pain, unspecified: Secondary | ICD-10-CM | POA: Insufficient documentation

## 2023-06-12 DIAGNOSIS — M25561 Pain in right knee: Secondary | ICD-10-CM | POA: Diagnosis not present

## 2023-06-12 DIAGNOSIS — C3490 Malignant neoplasm of unspecified part of unspecified bronchus or lung: Secondary | ICD-10-CM | POA: Diagnosis not present

## 2023-06-12 DIAGNOSIS — Z85118 Personal history of other malignant neoplasm of bronchus and lung: Secondary | ICD-10-CM | POA: Diagnosis not present

## 2023-06-12 DIAGNOSIS — R1084 Generalized abdominal pain: Secondary | ICD-10-CM | POA: Diagnosis not present

## 2023-06-12 DIAGNOSIS — R000 Tachycardia, unspecified: Secondary | ICD-10-CM | POA: Diagnosis not present

## 2023-06-12 DIAGNOSIS — M25562 Pain in left knee: Secondary | ICD-10-CM | POA: Diagnosis not present

## 2023-06-12 DIAGNOSIS — M7989 Other specified soft tissue disorders: Secondary | ICD-10-CM | POA: Diagnosis not present

## 2023-06-12 DIAGNOSIS — M79605 Pain in left leg: Secondary | ICD-10-CM | POA: Diagnosis not present

## 2023-06-12 DIAGNOSIS — M2550 Pain in unspecified joint: Secondary | ICD-10-CM

## 2023-06-12 DIAGNOSIS — I2510 Atherosclerotic heart disease of native coronary artery without angina pectoris: Secondary | ICD-10-CM | POA: Diagnosis not present

## 2023-06-12 LAB — D-DIMER, QUANTITATIVE: D-Dimer, Quant: 1.28 ug{FEU}/mL — ABNORMAL HIGH (ref 0.00–0.50)

## 2023-06-12 LAB — BRAIN NATRIURETIC PEPTIDE: B Natriuretic Peptide: 34 pg/mL (ref 0.0–100.0)

## 2023-06-12 LAB — BASIC METABOLIC PANEL WITH GFR
Anion gap: 9 (ref 5–15)
BUN: 9 mg/dL (ref 8–23)
CO2: 23 mmol/L (ref 22–32)
Calcium: 9.6 mg/dL (ref 8.9–10.3)
Chloride: 102 mmol/L (ref 98–111)
Creatinine, Ser: 1.04 mg/dL — ABNORMAL HIGH (ref 0.44–1.00)
GFR, Estimated: 59 mL/min — ABNORMAL LOW (ref >60)
Glucose, Bld: 90 mg/dL (ref 70–99)
Potassium: 3.8 mmol/L (ref 3.5–5.1)
Sodium: 134 mmol/L — ABNORMAL LOW (ref 135–145)

## 2023-06-12 MED ORDER — IOHEXOL 350 MG/ML SOLN
75.0000 mL | Freq: Once | INTRAVENOUS | Status: AC | PRN
  Administered 2023-06-12: 75 mL via INTRAVENOUS

## 2023-06-12 MED ORDER — MORPHINE SULFATE (PF) 4 MG/ML IV SOLN
4.0000 mg | Freq: Once | INTRAVENOUS | Status: AC
  Administered 2023-06-12: 4 mg via INTRAVENOUS
  Filled 2023-06-12: qty 1

## 2023-06-12 MED ORDER — HYDROCODONE-ACETAMINOPHEN 5-325 MG PO TABS: 1.0000 | ORAL_TABLET | Freq: Four times a day (QID) | ORAL | 0 refills | Status: DC | PRN

## 2023-06-12 MED ORDER — OXYCODONE-ACETAMINOPHEN 5-325 MG PO TABS
1.0000 | ORAL_TABLET | Freq: Once | ORAL | Status: AC
  Administered 2023-06-12: 1 via ORAL
  Filled 2023-06-12: qty 1

## 2023-06-12 NOTE — ED Provider Notes (Signed)
 [place] EMERGENCY DEPARTMENT AT [facility] Provider Note   CSN: [id number] Arrival date & time: 06/12/23  1614     History  Chief Complaint  Patient presents with   Foot Swelling    Kelly Douglas is a 68 y.o. female.  The history is provided by the patient and medical records. No language interpreter was used.     68 year old female with history of lung cancer status post radiation, scleroderma, GERD, presenting with complaint of aches and pain.  Patient states for the past 3 weeks she has had bilateral leg pain and described pain as a achy throbbing sensation to her feet initially, now her knees and as well as her right hip.  Pain is persistent, sometimes worse with movement but present at rest.  She also complaining of occasional pain to her chest wall with some associate shortness of breath but no pleuritic chest pain.  She mention her right hip pain seems to bother her more than usual prompting this ER visit.  She admits to tobacco use.  She has history of lung cancer and has been receiving radiation treatments.  She denies any prior history of PE or DVT.  She denies any injury.  No report of any fever or chills no hemoptysis no nausea vomiting diarrhea no bowel bladder incontinence or saddle anesthesia.  Patient is currently taking Tylenol  at home as needed for pain.  Home Medications Prior to Admission medications   Medication Sig Start Date End Date Taking? Authorizing Provider  acetaminophen  (TYLENOL ) 650 MG CR tablet Take 1,300 mg by mouth every 8 (eight) hours as needed for pain.     [provider]  albuterol  (VENTOLIN  HFA) 108 (90 Base) MCG/ACT inhaler Inhale 1-2 puffs into the lungs every 6 (six) hours as needed for wheezing or shortness of breath. 12/03/21   Retta Caster, MD  amLODipine  (NORVASC ) 5 MG tablet TAKE 1 TABLET EVERY DAY 02/22/23   Towanda Fret, MD  atorvastatin  (LIPITOR) 10 MG tablet TAKE 1 TABLET EVERY DAY 01/09/23   Towanda Fret, MD  busPIRone  (BUSPAR ) 5 MG tablet Take 1 tablet (5 mg total) by mouth 3 (three) times daily. 11/22/22   Towanda Fret, MD  Cholecalciferol  (VITAMIN D ) 125 MCG (5000 UT) CAPS Take 5,000 Units by mouth daily.     [provider]  DULoxetine  (CYMBALTA ) 60 MG capsule TAKE 1 CAPSULE EVERY DAY 12/05/22   Towanda Fret, MD  ezetimibe  (ZETIA ) 10 MG tablet Take 1 tablet (10 mg total) by mouth daily. 11/22/22   Towanda Fret, MD  famotidine  (PEPCID ) 20 MG tablet TAKE 1 TABLET EVERY DAY 02/22/23   Towanda Fret, MD  ferrous sulfate  325 (65 FE) MG EC tablet Take 325 mg by mouth daily with breakfast.    [provider]  hydrochlorothiazide  (HYDRODIURIL ) 12.5 MG tablet Take 1 tablet (12.5 mg total) by mouth daily for 14 days. 05/22/23 06/05/23  Alison Irvine, FNP  levothyroxine  (SYNTHROID ) 75 MCG tablet TAKE 1 TABLET EVERY OTHER DAY ALTERNATING WITH 1/2 TABLET EVERY OTHER DAY 09/30/22   Towanda Fret, MD  magnesium  oxide (MAG-OX) 400 (240 Mg) MG tablet TAKE 1 TABLET THREE TIMES DAILY 06/09/22   Towanda Fret, MD  meloxicam  (MOBIC ) 7.5 MG tablet TAKE 1 TABLET EVERY DAY 12/05/22   Towanda Fret, MD  mirtazapine  (REMERON ) 7.5 MG tablet TAKE 1 TABLET AT BEDTIME 12/05/22   Meldon Sport, MD  montelukast  (SINGULAIR ) 10 MG tablet TAKE  1 TABLET AT BEDTIME 02/22/23   Towanda Fret, MD  Multiple Vitamin (MULTIVITAMIN WITH MINERALS) TABS tablet Take 1 tablet by mouth daily.    [provider]  pantoprazole  (PROTONIX ) 40 MG tablet TAKE 1 TABLET TWICE DAILY 12/05/22   Towanda Fret, MD      Allergies    Aspirin     Review of Systems   Review of Systems  All other systems reviewed and are negative.   Physical Exam Updated Vital Signs BP (!) 141/81 (BP Location: Right Arm)   Pulse (!) 112   Temp 98 F (36.7 C) (Oral)   Resp 16   Ht 5\' 8"  (1.727 m)   Wt 77.1 kg   LMP  (LMP Unknown)   SpO2 96%   BMI 25.85 kg/m  Physical  Exam Vitals and nursing note reviewed.  Constitutional:      General: She is not in acute distress.    Appearance: She is well-developed.  HENT:     Head: Atraumatic.  Eyes:     Conjunctiva/sclera: Conjunctivae normal.  Neck:     Comments: No JVD Cardiovascular:     Rate and Rhythm: Tachycardia present.  Pulmonary:     Effort: Pulmonary effort is normal.  Abdominal:     Palpations: Abdomen is soft.     Tenderness: There is abdominal tenderness (Mild diffuse tenderness no guarding or rebound tenderness).  Musculoskeletal:        General: Tenderness (Diffuse tenderness to bilateral lower extremities on palpation including the hips knees and ankles without any overlying skin changes.  No significant peripheral edema appreciated.) present.     Cervical back: Neck supple.     Comments: Tenderness to right lumbar paraspinal muscle on palpation and tenderness about the hip on palpation without decreased ROM.    Skin:    Capillary Refill: Capillary refill takes less than 2 seconds.     Findings: No rash.  Neurological:     Mental Status: She is alert. Mental status is at baseline.  Psychiatric:        Mood and Affect: Mood normal.     ED Results / Procedures / Treatments   Labs (all labs ordered are listed, but only abnormal results are displayed) Labs Reviewed  BASIC METABOLIC PANEL WITH GFR - Abnormal; Notable for the following components:      Result Value   Sodium 134 (*)    Creatinine, Ser 1.04 (*)    GFR, Estimated 59 (*)    All other components within normal limits  D-DIMER, QUANTITATIVE - Abnormal; Notable for the following components:   D-Dimer, Quant 1.28 (*)    All other components within normal limits  BRAIN NATRIURETIC PEPTIDE    EKG None ED ECG REPORT   Date: 06/12/2023  Rate: 96  Rhythm: normal sinus rhythm  QRS Axis: right  Intervals: normal  ST/T Wave abnormalities: normal  Conduction Disutrbances:none  Narrative Interpretation:   Old EKG Reviewed:  unchanged  I have personally reviewed the EKG tracing and agree with the computerized printout as noted.   Radiology CT Angio Chest PE W and/or Wo Contrast Result Date: 06/12/2023 CLINICAL DATA:  Pulmonary embolism (PE) suspected, low to intermediate prob, positive D-dimer Foot swelling. Shortness of breath with exertion. Patient with history of lung cancer post radiation. EXAM: CT ANGIOGRAPHY CHEST WITH CONTRAST TECHNIQUE: Multidetector CT imaging of the chest was performed using the standard protocol during bolus administration of intravenous contrast. Multiplanar CT image reconstructions and MIPs were obtained  to evaluate the vascular anatomy. RADIATION DOSE REDUCTION: This exam was performed according to the departmental dose-optimization program which includes automated exposure control, adjustment of the mA and/or kV according to patient size and/or use of iterative reconstruction technique. CONTRAST:  75mL OMNIPAQUE  IOHEXOL  350 MG/ML SOLN COMPARISON:  Radiograph earlier today. Noncontrast chest CT 02/23/2023 FINDINGS: Cardiovascular: There are no filling defects within the pulmonary arteries to suggest pulmonary embolus. The left-sided pulmonary arteries are attenuated but no intraluminal filling defects. Stable leftward mediastinal and heart deviation. Coronary artery calcifications. Aortic atherosclerosis without acute aortic findings. No pericardial effusion. Mediastinum/Nodes: No enlarged mediastinal or hilar lymph nodes. Dilated patulous esophagus with layering intraluminal fluid distally. Mild wall thickening at the gastroesophageal junction. No visible thyroid  nodule. Lungs/Pleura: Treated left lower lobe pulmonary nodule measuring 14 x 13 mm, series 6, image 99, unchanged from prior exam. Surrounding post treatment related changes in the lower lobe without significant interval change. Again seen multiple pulmonary nodules. This includes a spiculated left upper lobe nodule measuring 7 x 5 mm,  series 6, image 42, unchanged allowing for differences in technique. Spiculated left upper lobe nodule measuring 8 x 6 mm, series 6, image 48, unchanged. Poorly defined ground-glass nodule in the right upper lobe measuring 10 mm series 6, image 65, unchanged. Chronic pleuroparenchymal scarring at the left lung apex as well as tree in bud opacity in the left upper lobe, unchanged from prior exam. No definite new airspace disease. No significant pleural effusion. Emphysema and bronchial thickening again seen. Upper Abdomen: No acute findings. Musculoskeletal: No suspicious bone lesion. No acute osseous findings. Review of the MIP images confirms the above findings. IMPRESSION: 1. No pulmonary embolus. 2. No acute intrathoracic abnormality. 3. Stable left lower lobe pulmonary nodule with surrounding post treatment related changes. 4. Multiple additional pulmonary nodules are unchanged, including the right upper lobe nodule that was increasing in size. Continued oncologic follow-up is recommended. 5. Dilated patulous esophagus with layering intraluminal fluid distally. Mild wall thickening at the gastroesophageal junction. Aortic Atherosclerosis (ICD10-I70.0) and Emphysema (ICD10-J43.9). Electronically Signed   By: Chadwick Colonel M.D.   On: 06/12/2023 20:09   DG Hip Unilat W or Wo Pelvis 2-3 Views Right Result Date: 06/12/2023 CLINICAL DATA:  Hip pain for 3 weeks.  History of lung cancer. EXAM: DG HIP (WITH OR WITHOUT PELVIS) 2-3V RIGHT COMPARISON:  Head CT 10/21/2021 FINDINGS: No evidence of acute fracture of the pelvis or right hip. No hip dislocation. Hip joint spaces preserved. No evidence of lytic or destructive bone lesion. No blastic lesion. Slight irregularity of the upper aspect of the right pubic body is chronic based on prior PET. No focal soft tissue abnormalities. IMPRESSION: No acute findings or explanation for hip pain. No radiographic evidence of metastatic disease. Electronically Signed   By:  Chadwick Colonel M.D.   On: 06/12/2023 17:09   DG Chest 2 View Result Date: 06/12/2023 CLINICAL DATA:  Swelling lower extremities. Shortness of breath. History of lung cancer with radiation EXAM: CHEST - 2 VIEW COMPARISON:  Chest x-ray 01/19/2022.  CT scan chest 02/23/2023. FINDINGS: Right lung is grossly clear. No consolidation, pneumothorax or effusion. Lung nodule seen on prior CT are not as well appreciated on this examination by x-ray. There is significant shift of the mediastinum from right to left with volume loss along the left hemithorax. The shift appears to be increased compared to the prior CT scan. Subtle opacities in left lung are again seen as on prior. No pneumothorax or effusion.  IMPRESSION: Posttreatment changes along left hemithorax with shift of the mediastinum from right to left which appears slightly more accentuated on today's examination on prior exam. Subtle opacity left lung are again noted. Please correlate with symptoms and if needed repeat CT scan for more direct comparison. Electronically Signed   By: Adrianna Horde M.D.   On: 06/12/2023 17:02    Procedures Procedures    Medications Ordered in ED Medications  morphine  (PF) 4 MG/ML injection 4 mg (4 mg Intravenous Given 06/12/23 1723)  iohexol  (OMNIPAQUE ) 350 MG/ML injection 75 mL (75 mLs Intravenous Contrast Given 06/12/23 1939)    ED Course/ Medical Decision Making/ A&P                                 Medical Decision Making Amount and/or Complexity of Data Reviewed Labs: ordered. Radiology: ordered.  Risk Prescription drug management.   BP (!) 141/81 (BP Location: Right Arm)   Pulse (!) 112   Temp 98 F (36.7 C) (Oral)   Resp 16   Ht 5\' 8"  (1.727 m)   Wt 77.1 kg   LMP  (LMP Unknown)   SpO2 96%   BMI 25.85 kg/m   31:29 PM   68 year old female with history of lung cancer status post radiation, scleroderma, GERD, presenting with complaint of aches and pain.  Patient states for the past 3 weeks she  has had bilateral leg pain and described pain as a achy throbbing sensation to her feet initially, now her knees and as well as her right hip.  Pain is persistent, sometimes worse with movement but present at rest.  She also complaining of occasional pain to her chest wall with some associate shortness of breath but no pleuritic chest pain.  She mention her right hip pain seems to bother her more than usual prompting this ER visit.  She admits to tobacco use.  She has history of lung cancer and has been receiving radiation treatments.  She denies any prior history of PE or DVT.  She denies any injury.  No report of any fever or chills no hemoptysis no nausea vomiting diarrhea no bowel bladder incontinence or saddle anesthesia.  Patient is currently taking Tylenol  at home as needed for pain.  Exam notable for tenderness to palpation throughout bilateral lower extremities, right hip as well as abdomen.  There are no overlying skin changes.  Patient is neurovascular intact and she does not have any significant peripheral edema of her lower extremities on my exam.  Patient reproducible pain likely musculoskeletal such as arthritis.  I do not appreciate any signs of infection.  She is neurovascular intact.  Given her history of lung cancer and having shortness of breath, will obtain a D-dimer to assess for potential PE although my suspicion is low as her primary complaint is primarily musculoskeletal related.  If D-dimer elevated may consider DVT study as well.  Patient given morphine  for pain.  Workup initiated.  -Labs ordered, independently viewed and interpreted by me.  Labs remarkable for elevated D-dimer of 1.28.  Will obtain chest CT angiogram to rule out PE.  She has normal BNP.  Fortunately her potassium is within normal limits. -The patient was maintained on a cardiac monitor.  I personally viewed and interpreted the cardiac monitored which showed an underlying rhythm of: NSR -Imaging independently  viewed and interpreted by me and I agree with radiologist's interpretation.  Result remarkable for initial  chest x-ray showing a subtle opacity in the left lung.  A chest CT angiogram obtained showed no evidence of PE and no acute changes.  Patient has multiple additional pulmonary nodules are unchanged however it is increased in size and she will need to continue to monitor by oncology.  She has a dilated esophagus with layering intraluminal fluid and mild wall thickening at the gastroesophageal junction that were noted.  Suspect this is likely some component of her gastritis.  Which is likely not new.  Her hip x-ray was unremarkable. -This patient presents to the ED for concern of joint pain, this involves an extensive number of treatment options, and is a complaint that carries with it a high risk of complications and morbidity.  The differential diagnosis includes muscle skeletal pain, PE, DVT, cellulitis, septic joint, gout, scleroderma -Co morbidities that complicate the patient evaluation includes lung cancer, GERD, scleroderma -Treatment includes morphine  -Reevaluation of the patient after these medicines showed that the patient improved -PCP office notes or outside notes reviewed -Discussion with attending Dr. Zammit.  -Escalation to admission/observation considered: patients feels much better, is comfortable with discharge, and will follow up with PCP -Prescription medication considered, patient comfortable with tramadol  -Social Determinant of Health considered which includes tobacco use.   With patient has multiple joint pain and history of scleroderma I suspect her joint discomfort is likely due to scleroderma.  I have low suspicion for DVT.  I encouraged patient to follow-up closely with her primary care doctor for further management.  She may benefit from treatment such as corticosteroid or immune suppressant if appropriate.  Will provide patient a short course of opiate pain medication for  pain control.         Final Clinical Impression(s) / ED Diagnoses Final diagnoses:  Polyarthralgia  SOB (shortness of breath)    Rx / DC Orders ED Discharge Orders          Ordered    HYDROcodone -acetaminophen  (NORCO/VICODIN) 5-325 MG tablet  Every 6 hours PRN        06/12/23 2054              Debbra Fairy, PA-C 06/12/23 2055    Cheyenne Cotta, MD 06/14/23 [phone]

## 2023-06-12 NOTE — Discharge Instructions (Signed)
 Your exam pain may be due to scleroderma.  Please discuss with your doctor for further evaluation of your condition.  Your CT scan also shows that your pulmonary nodule has increased in size.  Have your oncologist evaluate closely.  Your chest CT scan did not show any evidence of blood clot in your lung.  You may take Vicodin as prescribed for pain control but be aware it can cause drowsiness.

## 2023-06-12 NOTE — ED Triage Notes (Signed)
 Pt with c/o bilateral feet swelling x 1.5 months, also c/o rt ankle, knee and hip pain x 3 weeks. + SOB with exertion.  + lung CA-radiation.

## 2023-06-14 ENCOUNTER — Encounter: Payer: Self-pay | Admitting: Family Medicine

## 2023-06-22 ENCOUNTER — Ambulatory Visit: Payer: Self-pay

## 2023-06-22 NOTE — Telephone Encounter (Signed)
 Called [facility] to advise them of patient's condition & disposition.

## 2023-06-22 NOTE — Telephone Encounter (Signed)
 FYI Only or Action Required?: FYI only for provider  Patient was last seen in primary care on 05/22/2023 by Alison Irvine, FNP. Called Nurse Triage reporting Cough. Symptoms began a week ago. Interventions attempted: OTC medications: Cough & cold medication and Rest, hydration, or home remedies. Symptoms are: gradually worsening.  Triage Disposition: See Physician Within 24 Hours  Patient/caregiver understands and will follow disposition?: Unsure--No appointments available in the office--Patient advised Urgent Care or PCP if she gets worse               Copied from CRM [id number]. Topic: Clinical - Red Word Triage >> Jun 22, 2023  3:33 PM Elle L wrote: Red Word that prompted transfer to Nurse Triage: The patient called to schedule her hospital follow-up where she was seen for swelling and pain in her legs. However, she states that she has a fever, head congestion, she has been having nose bleeds, and a productive cough. Reason for Disposition  [1] Continuous (nonstop) coughing interferes with work or school AND [2] no improvement using cough treatment per Care Advice  Answer Assessment - Initial Assessment Questions Patient states she may need a new arthritis doctor and she wanted to talk to her PCP about that. She also wanted to have her liver levels checked due to her taking Tylenol  lately.  She is advised that this message will be sent to her PCP and if she were to go back to the Emergency Room they can check this as well if she is concerned about it.  Patient states she will probably end up going to the Urgent Care or to the Emergency Room today or tomorrow especially if she gets worse.    1. ONSET: "When did the cough begin?"      Last week 2. SEVERITY: "How bad is the cough today?"      ---- 3. SPUTUM: "Describe the color of your sputum" (none, dry cough; clear, white, yellow, green)     Yellowish green color 4. HEMOPTYSIS: "Are you coughing up any blood?" If so ask: "How  much?" (flecks, streaks, tablespoons, etc.)     No 5. DIFFICULTY BREATHING: "Are you having difficulty breathing?" If Yes, ask: "How bad is it?" (e.g., mild, moderate, severe)    - MILD: No SOB at rest, mild SOB with walking, speaks normally in sentences, can lie down, no retractions, pulse < 100.    - MODERATE: SOB at rest, SOB with minimal exertion and prefers to sit, cannot lie down flat, speaks in phrases, mild retractions, audible wheezing, pulse 100-120.    - SEVERE: Very SOB at rest, speaks in single words, struggling to breathe, sitting hunched forward, retractions, pulse > 120      No 6. FEVER: "Do you have a fever?" If Yes, ask: "What is your temperature, how was it measured, and when did it start?"     Off and on 7. CARDIAC HISTORY: "Do you have any history of heart disease?" (e.g., heart attack, congestive heart failure)      No 8. LUNG HISTORY: "Do you have any history of lung disease?"  (e.g., pulmonary embolus, asthma, emphysema)     COPD, Lung Cancer 9. PE RISK FACTORS: "Do you have a history of blood clots?" (or: recent major surgery, recent prolonged travel, bedridden)     No 10. OTHER SYMPTOMS: "Do you have any other symptoms?" (e.g., runny nose, wheezing, chest pain)       Off and on fever, head congestion, nosebleeds  Protocols used: Cough -  Acute Productive-A-AH

## 2023-07-03 ENCOUNTER — Ambulatory Visit (HOSPITAL_COMMUNITY)
Admission: RE | Admit: 2023-07-03 | Discharge: 2023-07-03 | Disposition: A | Discharge status: Home / Self Care | Payer: Medicare HMO | Source: Ambulatory Visit | Attending: Hematology | Admitting: Hematology

## 2023-07-03 ENCOUNTER — Inpatient Hospital Stay: Payer: Medicare HMO | Attending: Hematology

## 2023-07-03 DIAGNOSIS — C3492 Malignant neoplasm of unspecified part of left bronchus or lung: Secondary | ICD-10-CM

## 2023-07-03 DIAGNOSIS — Z923 Personal history of irradiation: Secondary | ICD-10-CM | POA: Diagnosis not present

## 2023-07-03 DIAGNOSIS — C3432 Malignant neoplasm of lower lobe, left bronchus or lung: Secondary | ICD-10-CM | POA: Diagnosis not present

## 2023-07-03 DIAGNOSIS — D509 Iron deficiency anemia, unspecified: Secondary | ICD-10-CM | POA: Insufficient documentation

## 2023-07-03 DIAGNOSIS — C3412 Malignant neoplasm of upper lobe, left bronchus or lung: Secondary | ICD-10-CM | POA: Diagnosis not present

## 2023-07-03 DIAGNOSIS — J432 Centrilobular emphysema: Secondary | ICD-10-CM | POA: Diagnosis not present

## 2023-07-03 DIAGNOSIS — D508 Other iron deficiency anemias: Secondary | ICD-10-CM

## 2023-07-03 DIAGNOSIS — F17210 Nicotine dependence, cigarettes, uncomplicated: Secondary | ICD-10-CM | POA: Insufficient documentation

## 2023-07-03 DIAGNOSIS — I700 Atherosclerosis of aorta: Secondary | ICD-10-CM | POA: Diagnosis not present

## 2023-07-03 LAB — CBC WITH DIFFERENTIAL/PLATELET
Abs Immature Granulocytes: 0.04 10*3/uL (ref 0.00–0.07)
Basophils Absolute: 0.1 10*3/uL (ref 0.0–0.1)
Basophils Relative: 0 %
Eosinophils Absolute: 0.2 10*3/uL (ref 0.0–0.5)
Eosinophils Relative: 2 %
HCT: 41.9 % (ref 36.0–46.0)
Hemoglobin: 13.4 g/dL (ref 12.0–15.0)
Immature Granulocytes: 0 %
Lymphocytes Relative: 32 %
Lymphs Abs: 3.7 10*3/uL (ref 0.7–4.0)
MCH: 26 pg (ref 26.0–34.0)
MCHC: 32 g/dL (ref 30.0–36.0)
MCV: 81.2 fL (ref 80.0–100.0)
Monocytes Absolute: 1.1 10*3/uL — ABNORMAL HIGH (ref 0.1–1.0)
Monocytes Relative: 10 %
Neutro Abs: 6.6 10*3/uL (ref 1.7–7.7)
Neutrophils Relative %: 56 %
Platelets: 378 10*3/uL (ref 150–400)
RBC: 5.16 MIL/uL — ABNORMAL HIGH (ref 3.87–5.11)
RDW: 14.8 % (ref 11.5–15.5)
WBC: 11.7 10*3/uL — ABNORMAL HIGH (ref 4.0–10.5)
nRBC: 0 % (ref 0.0–0.2)

## 2023-07-03 LAB — COMPREHENSIVE METABOLIC PANEL WITH GFR
ALT: 25 U/L (ref 0–44)
AST: 28 U/L (ref 15–41)
Albumin: 4.1 g/dL (ref 3.5–5.0)
Alkaline Phosphatase: 108 U/L (ref 38–126)
Anion gap: 11 (ref 5–15)
BUN: 11 mg/dL (ref 8–23)
CO2: 25 mmol/L (ref 22–32)
Calcium: 9.7 mg/dL (ref 8.9–10.3)
Chloride: 104 mmol/L (ref 98–111)
Creatinine, Ser: 0.96 mg/dL (ref 0.44–1.00)
GFR, Estimated: >60 mL/min (ref >60)
Glucose, Bld: 105 mg/dL — ABNORMAL HIGH (ref 70–99)
Potassium: 3.7 mmol/L (ref 3.5–5.1)
Sodium: 140 mmol/L (ref 135–145)
Total Bilirubin: 0.8 mg/dL (ref 0.0–1.2)
Total Protein: 7.8 g/dL (ref 6.5–8.1)

## 2023-07-03 LAB — IRON AND TIBC
Iron: 87 ug/dL (ref 28–170)
Saturation Ratios: 24 % (ref 10.4–31.8)
TIBC: 367 ug/dL (ref 250–450)
UIBC: 280 ug/dL

## 2023-07-03 LAB — FERRITIN: Ferritin: 55 ng/mL (ref 11–307)

## 2023-07-03 MED ORDER — IOHEXOL 300 MG/ML  SOLN
75.0000 mL | Freq: Once | INTRAMUSCULAR | Status: AC | PRN
Start: 2023-07-03 — End: 2023-07-03
  Administered 2023-07-03: 75 mL via INTRAVENOUS

## 2023-07-04 ENCOUNTER — Ambulatory Visit: Payer: Self-pay | Admitting: Family Medicine

## 2023-07-05 ENCOUNTER — Ambulatory Visit

## 2023-07-05 VITALS — Ht 68.0 in | Wt 161.4 lb

## 2023-07-05 DIAGNOSIS — Z7189 Other specified counseling: Secondary | ICD-10-CM

## 2023-07-05 DIAGNOSIS — I10 Essential (primary) hypertension: Secondary | ICD-10-CM

## 2023-07-05 DIAGNOSIS — D509 Iron deficiency anemia, unspecified: Secondary | ICD-10-CM

## 2023-07-05 DIAGNOSIS — Z636 Dependent relative needing care at home: Secondary | ICD-10-CM

## 2023-07-05 DIAGNOSIS — Z1231 Encounter for screening mammogram for malignant neoplasm of breast: Secondary | ICD-10-CM

## 2023-07-05 DIAGNOSIS — Z0000 Encounter for general adult medical examination without abnormal findings: Secondary | ICD-10-CM

## 2023-07-05 DIAGNOSIS — R911 Solitary pulmonary nodule: Secondary | ICD-10-CM

## 2023-07-05 DIAGNOSIS — Z780 Asymptomatic menopausal state: Secondary | ICD-10-CM

## 2023-07-05 DIAGNOSIS — J449 Chronic obstructive pulmonary disease, unspecified: Secondary | ICD-10-CM

## 2023-07-05 DIAGNOSIS — Z789 Other specified health status: Secondary | ICD-10-CM

## 2023-07-05 DIAGNOSIS — E782 Mixed hyperlipidemia: Secondary | ICD-10-CM

## 2023-07-05 DIAGNOSIS — Z6379 Other stressful life events affecting family and household: Secondary | ICD-10-CM

## 2023-07-05 DIAGNOSIS — Z5941 Food insecurity: Secondary | ICD-10-CM

## 2023-07-05 DIAGNOSIS — Z599 Problem related to housing and economic circumstances, unspecified: Secondary | ICD-10-CM

## 2023-07-05 DIAGNOSIS — C3492 Malignant neoplasm of unspecified part of left bronchus or lung: Secondary | ICD-10-CM

## 2023-07-05 NOTE — Patient Instructions (Signed)
 Ms. Roat , Thank you for taking time out of your busy schedule to complete your Annual Wellness Visit with me. I enjoyed our conversation and look forward to speaking with you again next year. I, as well as your care team,  appreciate your ongoing commitment to your health goals. Please review the following plan we discussed and let me know if I can assist you in the future.  Your Game plan/ To Do List     Referrals Placed:  Osteoporosis Screening/Yearly Mammogram Please call the number below to schedule your appt. [facility] Phone: [phone]  A referral has been placed for you to check and see what additional resources are available to you.   If you haven't heard from anyone within the next 7 business days, please call them and let them know a referral has been placed for you Phone: [phone]  Follow up Visits: Next Medicare AWV with clinical staff:July 08, 2024 at 3:50 pm telephone visit Next Office Visit with your Primary Care Doctor: July 18, 2023 in office  Clinician Recommendations:   Aim for 30 minutes of exercise or brisk walking, 6-8 glasses of water , and 5 servings of fruits and vegetables each day.  I enjoyed our conversation today and look forward to talking with you again next year!! Have a wonderful and safe year.  All the best, Braylinn Gulden      This is a list of the screening recommended for you and due dates:  Health Maintenance  Topic Date Due   Zoster (Shingles) Vaccine (1 of 2) Never done   DTaP/Tdap/Td vaccine (3 - Td or Tdap) 11/16/2020   DEXA scan (bone density measurement)  09/04/2022   Mammogram  09/30/2022   COVID-19 Vaccine (1) 12/06/2023*   Flu Shot  08/18/2023   Pneumococcal Vaccine for age over 56 (3 of 3 - PCV20 or PCV21) 10/30/2023   Medicare Annual Wellness Visit  07/04/2024   Colon Cancer Screening  11/10/2024   Hepatitis C Screening  Completed   HPV Vaccine  Aged Out   Meningitis B Vaccine  Aged Out  *Topic was  postponed. The date shown is not the original due date.    Advanced directives: (Declined) Advance directive discussed with you today. Even though you declined this today, please call our office should you change your mind, and we can give you the proper paperwork for you to fill out. Advance Care Planning is important because it:  [x]  Makes sure you receive the medical care that is consistent with your values, goals, and preferences  [x]  It provides guidance to your family and loved ones and reduces their decisional burden about whether or not they are making the right decisions based on your wishes.  Follow the link provided in your after visit summary or read over the paperwork we have mailed to you to help you started getting your Advance Directives in place. If you need assistance in completing these, please reach out to us  so that we can help you!  See attachments for Preventive Care and Fall Prevention Tips.   Managing Pain Without Opioids  Opioids are strong medicines used to treat moderate to severe pain. For some people, especially those who have long-term (chronic) pain, opioids may not be the best choice for pain management due to: Side effects like nausea, constipation, and sleepiness. The risk of addiction (opioid use disorder). The longer you take opioids, the greater your risk of addiction. Pain that lasts for more  than 3 months is called chronic pain. Managing chronic pain usually requires more than one approach and is often provided by a team of health care providers working together (multidisciplinary approach). Pain management may be done at a pain management center or pain clinic. How to manage pain without the use of opioids Use non-opioid medicines Non-opioid medicines for pain may include: Over-the-counter or prescription non-steroidal anti-inflammatory drugs (NSAIDs). These may be the first medicines used for pain. They work well for muscle and bone pain, and they  reduce swelling. Acetaminophen . This over-the-counter medicine may work well for milder pain but not swelling. Antidepressants. These may be used to treat chronic pain. A certain type of antidepressant (tricyclics) is often used. These medicines are given in lower doses for pain than when used for depression. Anticonvulsants. These are usually used to treat seizures but may also reduce nerve (neuropathic) pain. Muscle relaxants. These relieve pain caused by sudden muscle tightening (spasms). You may also use a pain medicine that is applied to the skin as a patch, cream, or gel (topical analgesic), such as a numbing medicine. These may cause fewer side effects than medicines taken by mouth. Do certain therapies as directed Some therapies can help with pain management. They include: Physical therapy. You will do exercises to gain strength and flexibility. A physical therapist may teach you exercises to move and stretch parts of your body that are weak, stiff, or painful. You can learn these exercises at physical therapy visits and practice them at home. Physical therapy may also involve: Massage. Heat wraps or applying heat or cold to affected areas. Electrical signals that interrupt pain signals (transcutaneous electrical nerve stimulation, TENS). Weak lasers that reduce pain and swelling (low-level laser therapy). Signals from your body that help you learn to regulate pain (biofeedback). Occupational therapy. This helps you to learn ways to function at home and work with less pain. Recreational therapy. This involves trying new activities or hobbies, such as a physical activity or drawing. Mental health therapy, including: Cognitive behavioral therapy (CBT). This helps you learn coping skills for dealing with pain. Acceptance and commitment therapy (ACT) to change the way you think and react to pain. Relaxation therapies, including muscle relaxation exercises and mindfulness-based stress  reduction. Pain management counseling. This may be individual, family, or group counseling.  Receive medical treatments Medical treatments for pain management include: Nerve block injections. These may include a pain blocker and anti-inflammatory medicines. You may have injections: Near the spine to relieve chronic back or neck pain. Into joints to relieve back or joint pain. Into nerve areas that supply a painful area to relieve body pain. Into muscles (trigger point injections) to relieve some painful muscle conditions. A medical device placed near your spine to help block pain signals and relieve nerve pain or chronic back pain (spinal cord stimulation device). Acupuncture. Follow these instructions at home Medicines Take over-the-counter and prescription medicines only as told by your health care provider. If you are taking pain medicine, ask your health care providers about possible side effects to watch out for. Do not drive or use heavy machinery while taking prescription opioid pain medicine. Lifestyle  Do not use drugs or alcohol to reduce pain. If you drink alcohol, limit how much you have to: 0-1 drink a day for women who are not pregnant. 0-2 drinks a day for men. Know how much alcohol is in a drink. In the [place] one drink equals one 12 oz bottle of beer (355 mL), one 5  oz glass of wine (148 mL), or one 1 oz glass of hard liquor (44 mL). Do not use any products that contain nicotine  or tobacco. These products include cigarettes, chewing tobacco, and vaping devices, such as e-cigarettes. If you need help quitting, ask your health care provider. Eat a healthy diet and maintain a healthy weight. Poor diet and excess weight may make pain worse. Eat foods that are high in fiber. These include fresh fruits and vegetables, whole grains, and beans. Limit foods that are high in fat and processed sugars, such as fried and sweet foods. Exercise regularly. Exercise lowers stress and may  help relieve pain. Ask your health care provider what activities and exercises are safe for you. If your health care provider approves, join an exercise class that combines movement and stress reduction. Examples include yoga and tai chi. Get enough sleep. Lack of sleep may make pain worse. Lower stress as much as possible. Practice stress reduction techniques as told by your therapist. General instructions Work with all your pain management providers to find the treatments that work best for you. You are an important member of your pain management team. There are many things you can do to reduce pain on your own. Consider joining an online or in-person support group for people who have chronic pain. Keep all follow-up visits. This is important. Where to find more information You can find more information about managing pain without opioids from: American Academy of Pain Medicine: painmed.org Institute for Chronic Pain: instituteforchronicpain.org American Chronic Pain Association: theacpa.org Contact a health care provider if: You have side effects from pain medicine. Your pain gets worse or does not get better with treatments or home therapy. You are struggling with anxiety or depression. Summary Many types of pain can be managed without opioids. Chronic pain may respond better to pain management without opioids. Pain is best managed when you and a team of health care providers work together. Pain management without opioids may include non-opioid medicines, medical treatments, physical therapy, mental health therapy, and lifestyle changes. Tell your health care providers if your pain gets worse or is not being managed well enough. This information is not intended to replace advice given to you by your health care provider. Make sure you discuss any questions you have with your health care provider. Document Revised: 04/15/2020 Document Reviewed: 04/15/2020 Elsevier Patient Education  2023  [employer]. Understanding Your Risk for Falls Millions of people have serious injuries from falls each year. It is important to understand your risk of falling. Talk with your health care provider about your risk and what you can do to lower it. If you do have a serious fall, make sure to tell your provider. Falling once raises your risk of falling again. How can falls affect me? Serious injuries from falls are common. These include: Broken bones, such as hip fractures. Head injuries, such as traumatic brain injuries (TBI) or concussions. A fear of falling can cause you to avoid activities and stay at home. This can make your muscles weaker and raise your risk for a fall. What can increase my risk? There are a number of risk factors that increase your risk for falling. The more risk factors you have, the higher your risk of falling. Serious injuries from a fall happen most often to people who are older than 68 years old. Teenagers and young adults ages 58-29 are also at higher risk. Common risk factors include: Weakness in the lower body. Being generally weak or  confused due to long-term (chronic) illness. Dizziness or balance problems. Poor vision. Medicines that cause dizziness or drowsiness. These may include: Medicines for your blood pressure, heart, anxiety, insomnia, or swelling (edema). Pain medicines. Muscle relaxants. Other risk factors include: Drinking alcohol. Having had a fall in the past. Having foot pain or wearing improper footwear. Working at a dangerous job. Having any of the following in your home: Tripping hazards, such as floor clutter or loose rugs. Poor lighting. Pets. Having dementia or memory loss. What actions can I take to lower my risk of falling?     Physical activity Stay physically fit. Do strength and balance exercises. Consider taking a regular class to build strength and balance. Yoga and tai chi are good options. Vision Have your eyes checked  every year and your prescription for glasses or contacts updated as needed. Shoes and walking aids Wear non-skid shoes. Wear shoes that have rubber soles and low heels. Do not wear high heels. Do not walk around the house in socks or slippers. Use a cane or walker as told by your provider. Home safety Attach secure railings on both sides of your stairs. Install grab bars for your bathtub, shower, and toilet. Use a non-skid mat in your bathtub or shower. Attach bath mats securely with double-sided, non-slip rug tape. Use good lighting in all rooms. Keep a flashlight near your bed. Make sure there is a clear path from your bed to the bathroom. Use night-lights. Do not use throw rugs. Make sure all carpeting is taped or tacked down securely. Remove all clutter from walkways and stairways, including extension cords. Repair uneven or broken steps and floors. Avoid walking on icy or slippery surfaces. Walk on the grass instead of on icy or slick sidewalks. Use ice melter to get rid of ice on walkways in the winter. Use a cordless phone. Questions to ask your health care provider Can you help me check my risk for a fall? Do any of my medicines make me more likely to fall? Should I take a vitamin D  supplement? What exercises can I do to improve my strength and balance? Should I make an appointment to have my vision checked? Do I need a bone density test to check for weak bones (osteoporosis)? Would it help to use a cane or a walker? Where to find more information Centers for Disease Control and Prevention, [employer]: TonerPromos.no Community-Based Fall Prevention Programs: TonerPromos.no [employer] on Aging: BaseRingTones.pl Contact a health care provider if: You fall at home. You are afraid of falling at home. You feel weak, drowsy, or dizzy. This information is not intended to replace advice given to you by your health care provider. Make sure you discuss any questions you have with your health care  provider. Document Revised: 09/06/2021 Document Reviewed: 09/06/2021 Elsevier Patient Education  2024 [employer].

## 2023-07-05 NOTE — Progress Notes (Signed)
 Please remove hydrochlorothiazide   hydrocodone  from med list. Patient completed course Subjective:   Kelly Douglas is a 68 y.o. who presents for a Medicare Wellness preventive visit.  As a reminder, Annual Wellness Visits don't include a physical exam, and some assessments may be limited, especially if this visit is performed virtually. We may recommend an in-person follow-up visit with your provider if needed.  Visit Complete: Virtual I connected with  Kelly Douglas on 07/05/23 by a audio enabled telemedicine application and verified that I am speaking with the correct person using two identifiers.  Patient Location: Home  Provider Location: Home Office  I discussed the limitations of evaluation and management by telemedicine. The patient expressed understanding and agreed to proceed.  Vital Signs: Because this visit was a virtual/telehealth visit, some criteria may be missing or patient reported. Any vitals not documented were not able to be obtained and vitals that have been documented are patient reported.  VideoDeclined- This patient declined Librarian, academic. Therefore the visit was completed with audio only.  Persons Participating in Visit: Patient.  AWV Questionnaire: No: Patient Medicare AWV questionnaire was not completed prior to this visit.  Cardiac Risk Factors include: advanced age (>65men, >40 women);dyslipidemia;hypertension;sedentary lifestyle;smoking/ tobacco exposure     Objective:    Today's Vitals   07/05/23 1605 07/05/23 1614  Weight: 161 lb 6.4 oz (73.2 kg)   Height: 5' 8 (1.727 m)   PainSc:  4    Body mass index is 24.54 kg/m.     07/05/2023    4:19 PM 06/12/2023    4:26 PM 03/02/2023    2:48 PM 10/20/2022   10:52 AM 06/15/2022    3:33 PM 03/17/2022    2:57 PM 01/27/2022    4:04 PM  Advanced Directives  Does Patient Have a Medical Advance Directive? No No No No No No No  Would patient like information on creating a  medical advance directive? No - Patient declined  No - Patient declined No - Patient declined No - Patient declined No - Patient declined No - Patient declined    Current Medications (verified) Outpatient Encounter Medications as of 07/05/2023  Medication Sig   acetaminophen  (TYLENOL ) 650 MG CR tablet Take 1,300 mg by mouth every 8 (eight) hours as needed for pain.    albuterol  (VENTOLIN  HFA) 108 (90 Base) MCG/ACT inhaler Inhale 1-2 puffs into the lungs every 6 (six) hours as needed for wheezing or shortness of breath.   amLODipine  (NORVASC ) 5 MG tablet TAKE 1 TABLET EVERY DAY   atorvastatin  (LIPITOR) 10 MG tablet TAKE 1 TABLET EVERY DAY   DULoxetine  (CYMBALTA ) 60 MG capsule TAKE 1 CAPSULE EVERY DAY   famotidine  (PEPCID ) 20 MG tablet TAKE 1 TABLET EVERY DAY   ferrous sulfate  325 (65 FE) MG EC tablet Take 325 mg by mouth daily with breakfast.   levothyroxine  (SYNTHROID ) 75 MCG tablet TAKE 1 TABLET EVERY OTHER DAY ALTERNATING WITH 1/2 TABLET EVERY OTHER DAY   meloxicam  (MOBIC ) 7.5 MG tablet TAKE 1 TABLET EVERY DAY   mirtazapine  (REMERON ) 7.5 MG tablet TAKE 1 TABLET AT BEDTIME   montelukast  (SINGULAIR ) 10 MG tablet TAKE 1 TABLET AT BEDTIME   Multiple Vitamin (MULTIVITAMIN WITH MINERALS) TABS tablet Take 1 tablet by mouth daily.   pantoprazole  (PROTONIX ) 40 MG tablet TAKE 1 TABLET TWICE DAILY   hydrochlorothiazide  (HYDRODIURIL ) 12.5 MG tablet Take 1 tablet (12.5 mg total) by mouth daily for 14 days. (Patient not taking: Reported on 07/05/2023)  HYDROcodone -acetaminophen  (NORCO/VICODIN) 5-325 MG tablet Take 1 tablet by mouth every 6 (six) hours as needed for moderate pain (pain score 4-6). (Patient not taking: Reported on 07/05/2023)   No facility-administered encounter medications on file as of 07/05/2023.    Allergies (verified) Aspirin    History: Past Medical History:  Diagnosis Date   Anxiety    Arteriosclerotic cardiovascular disease (ASCVD) 2013   coronary calcification-left main, LAD  and CX; small pericardial effusion   Arthritis    hips   Cancer (HCC)    Lung Cancer    Chronic back pain    Chronic bronchitis    Chronic lung disease    Chronic scarring and volume loss-left lung; characteristics of a chronic infectious process-possible MAI   Depression    Dyspnea    occasional    GERD (gastroesophageal reflux disease)    Dr Riley Cheadle EGD 09/2009->esophagitis, sm HH, antral erosions, atonic esophagus   History of radiation therapy    Left and Right Lung- 11/24/21-12/07/21- Dr. Retta Caster   Hyperlipidemia    Hypertension    Hypothyroid 1981 approx   Scleroderma (HCC)    Seasonal allergies    Syncope    Multiple spells over the past 40+ years, likely neurocardiogenic   Tobacco abuse 06/24/2009   Past Surgical History:  Procedure Laterality Date   BILATERAL SALPINGOOPHORECTOMY  2013    Dr. Randolm Butte; uterus remains in situ   BRONCHIAL BIOPSY  11/21/2019   Procedure: BRONCHIAL BIOPSIES;  Surgeon: Denson Flake, MD;  Location: [facility] ENDOSCOPY;  Service: Pulmonary;;   BRONCHIAL BRUSHINGS  11/21/2019   Procedure: BRONCHIAL BRUSHINGS;  Surgeon: Denson Flake, MD;  Location: [facility] ENDOSCOPY;  Service: Pulmonary;;   BRONCHIAL NEEDLE ASPIRATION BIOPSY  11/21/2019   Procedure: BRONCHIAL NEEDLE ASPIRATION BIOPSIES;  Surgeon: Denson Flake, MD;  Location: [facility] ENDOSCOPY;  Service: Pulmonary;;   BRONCHIAL WASHINGS  11/21/2019   Procedure: BRONCHIAL WASHINGS;  Surgeon: Denson Flake, MD;  Location: [facility] ENDOSCOPY;  Service: Pulmonary;;   COLONOSCOPY  09/28/09   anal papilla otherwise normal   COLONOSCOPY N/A 08/29/2012   HAL:[id number] polyp-removed as described above. tubular adenoma   COLONOSCOPY WITH PROPOFOL  N/A 11/11/2019   Procedure: COLONOSCOPY WITH PROPOFOL ;  Surgeon: Suzette Espy, MD;  Location: [facility] ENDO SUITE;  Service: Endoscopy;  Laterality: N/A;  2:30pm   ESOPHAGOGASTRODUODENOSCOPY  10/07/09   Dr. Margery Sheets of esophageal mucosa, diffusely ?esophagitis (bx benign),  small HH/antral erosions, erythema bx benign. 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 Hiatal hernia   FUDUCIAL PLACEMENT  10/18/2017   Procedure: PLACEMENT OF 3 FUDUCIAL INTO LEFT LOWER LOBE OF LUNG-TARGET 1;  Surgeon: Denson Flake, MD;  Location: [facility] OR;  Service: Thoracic;;   LAPAROSCOPIC APPENDECTOMY  07/16/2011   Procedure: APPENDECTOMY LAPAROSCOPIC;  Surgeon: Lovena Rubinstein, MD;  Location: [facility] ORS;  Service: General;  Laterality: N/A;   POLYPECTOMY  11/11/2019   Procedure: POLYPECTOMY;  Surgeon: Suzette Espy, MD;  Location: [facility] ENDO SUITE;  Service: Endoscopy;;   TUBAL LIGATION     VIDEO BRONCHOSCOPY  10/18/2017   VIDEO BRONCHOSCOPY WITH ENDOBRONCHIAL NAVIGATION N/A 10/18/2017   Procedure: VIDEO BRONCHOSCOPY WITH ENDOBRONCHIAL NAVIGATION;  Surgeon: Denson Flake, MD;  Location: [facility] OR;  Service: Thoracic;  Laterality: N/A;   VIDEO BRONCHOSCOPY WITH ENDOBRONCHIAL NAVIGATION Left 11/21/2019   Procedure: VIDEO BRONCHOSCOPY WITH ENDOBRONCHIAL NAVIGATION;  Surgeon: Denson Flake, MD;  Location: [facility] ENDOSCOPY;  Service: Pulmonary;  Laterality: Left;   Family History  Problem Relation Age of Onset   Aneurysm Mother    Stroke Father    Aneurysm Father    Hypertension Sister    Cancer Brother        throat cancer   Coronary artery disease Brother    Hypertension Brother    Down syndrome Brother    Hypertension Brother    Hypertension Brother        father/mother   Throat cancer Brother 49       throat cancer   Diabetes Brother    Diabetes Brother    Anesthesia problems Neg Hx    Hypotension Neg Hx    Malignant hyperthermia Neg Hx    Pseudochol deficiency Neg Hx    Colon cancer Neg Hx    Social History   Socioeconomic History   Marital status: Married    Spouse name: Not on file   Number of  children: 2   Years of education: Not on file   Highest education level: Not on file  Occupational History   Occupation: disabled    Employer: NOT EMPLOYED  Tobacco Use   Smoking status: Every Day    Current packs/day: 0.50    Average packs/day: 0.5 packs/day for 52.0 years (26.0 ttl pk-yrs)    Types: Cigarettes    Start date: 07/14/1971   Smokeless tobacco: Never   Tobacco comments:    0.5 packs of cigarettes smoked daily ARJ 12/31/19  Vaping Use   Vaping status: Never Used  Substance and Sexual Activity   Alcohol use: Not Currently    Alcohol/week: 0.0 standard drinks of alcohol    Comment: quit June 2012-used to drink 6 pack daily, occasional beer per pt 11/20/19   Drug use: No   Sexual activity: Yes    Birth control/protection: Post-menopausal  Other Topics Concern   Not on file  Social History Narrative   Not on file   Social Drivers of Health   Financial Resource Strain: High Risk (07/05/2023)   Overall Financial Resource Strain (CARDIA)    Difficulty of Paying Living Expenses: Hard  Food Insecurity: Food Insecurity Present (07/05/2023)   Hunger Vital Sign    Worried About Running Out of Food in the Last Year: Often true    Ran Out of Food in the Last Year: Never true  Transportation Needs: No Transportation Needs (07/05/2023)   PRAPARE - Administrator, Civil Service (Medical): No    Lack of Transportation (Non-Medical): No  Physical Activity: Inactive (07/05/2023)   Exercise Vital Sign    Days of Exercise per Week: 0 days    Minutes of Exercise per Session: 0 min  Stress: No Stress Concern Present (07/05/2023)   [employer] of Occupational Health - Occupational Stress Questionnaire    Feeling of Stress: Not at all  Social Connections: Moderately Integrated (07/05/2023)   Social Connection and Isolation Panel    Frequency of Communication with Friends and Family: More than three times a week    Frequency of Social Gatherings with Friends and Family:  Once a week    Attends Religious Services: More than 4 times per year    Active Member of [employer] or Organizations: No    Attends Banker Meetings: Never    Marital Status: Married    Tobacco Counseling Ready to quit: No Counseling given: Yes Tobacco comments: 0.5 packs of cigarettes smoked daily ARJ 12/31/19  Clinical Intake:  Pre-visit preparation completed: Yes  Pain : 0-10 Pain Score: 4  Pain Type: Chronic pain Pain Location: Ankle Pain Orientation: Right Pain Descriptors / Indicators: Aching, Constant Pain Onset: More than a month ago Pain Frequency: Constant     BMI - recorded: 24.54 Nutritional Status: BMI of 19-24  Normal Nutritional Risks: None Diabetes: No  Lab Results  Component Value Date   HGBA1C 5.1 10/27/2015   HGBA1C 5.7 (H) 05/19/2015   HGBA1C 5.8 (H) 02/03/2015     How often do you need to have someone help you when you read instructions, pamphlets, or other written materials from your doctor or pharmacy?: 1 - Never  Interpreter Needed?: No  Information entered by :: Rockne Chyle W CMA   Activities of Daily Living     07/05/2023    4:20 PM  In your present state of health, do you have any difficulty performing the following activities:  Hearing? 0  Vision? 0  Difficulty concentrating or making decisions? 0  Walking or climbing stairs? 0  Dressing or bathing? 0  Doing errands, shopping? 0  Preparing Food and eating ? N  Using the Toilet? N  In the past six months, have you accidently leaked urine? N  Do you have problems with loss of bowel control? N  Managing your Medications? N  Managing your Finances? N  Housekeeping or managing your Housekeeping? Y    Patient Care Team: Towanda Fret, MD as PCP - General Rothbart, Windsor Hatcher, MD (Cardiology) Riley Cheadle Windsor Hatcher, MD (Gastroenterology) Lorenso Romance, PTA as Physical Therapy Assistant (Physical Therapy) Reynold Caves, MD as Consulting Physician (Otolaryngology)  I have  updated your Care Teams any recent Medical Services you may have received from other providers in the past year.     Assessment:   This is a routine wellness examination for Kaelei.  Hearing/Vision screen Hearing Screening - Comments:: Patient denies any hearing difficulties.   Vision Screening - Comments:: Wears rx glasses - up to date with routine eye exams  Patient sees my eye doctor in [place]    Goals Addressed             This Visit's Progress    Patient Stated       Stay healthy and increase energy       Depression Screen     07/05/2023    4:17 PM 05/22/2023    3:56 PM 11/22/2022    4:05 PM 09/27/2022    4:01 PM 09/27/2022    3:35 PM 06/09/2022    1:08 PM 02/03/2022    1:12 PM  PHQ 2/9 Scores  PHQ - 2 Score 0 0 0 2 0 0 0  PHQ- 9 Score 0 0 4 7 5 4  0    Fall Risk     07/05/2023    4:19 PM 05/22/2023    3:56 PM 09/27/2022    3:34 PM 06/09/2022    1:07 PM 02/03/2022    1:12 PM  Fall Risk   Falls in the past year? 0 0 0 0 0  Number falls in past yr: 0 0  0 0  Injury with Fall? 0 0 0 0 0  Risk for fall due to : No Fall Risks No Fall Risks  No Fall Risks No Fall Risks  Follow up Falls evaluation completed Falls evaluation completed  Falls evaluation completed Falls evaluation completed      Data saved with a previous flowsheet row definition  MEDICARE RISK AT HOME:  Medicare Risk at Home Any stairs in or around the home?: Yes If so, are there any without handrails?: No Home free of loose throw rugs in walkways, pet beds, electrical cords, etc?: Yes Adequate lighting in your home to reduce risk of falls?: Yes Life alert?: No Use of a cane, walker or w/c?: No Grab bars in the bathroom?: No Shower chair or bench in shower?: No Elevated toilet seat or a handicapped toilet?: No  TIMED UP AND GO:  Was the test performed?  No  Cognitive Function: 6CIT completed    11/25/2020    8:32 AM  MMSE - Mini Mental State Exam  Not completed: Unable to complete         07/05/2023    4:21 PM 11/29/2021   10:55 AM 11/25/2020    8:32 AM 11/20/2019    2:42 PM 07/05/2018    8:29 AM  6CIT Screen  What Year? 0 points 0 points 4 points 0 points 0 points  What month? 0 points 0 points 0 points 0 points 0 points  What time? 0 points 0 points 0 points 0 points 0 points  Count back from 20 0 points 0 points 4 points 0 points 0 points  Months in reverse 0 points 0 points 0 points 2 points 0 points  Repeat phrase 0 points 10 points 4 points 0 points 0 points  Total Score 0 points 10 points 12 points 2 points 0 points    Immunizations Immunization History  Administered Date(s) Administered   Fluad Quad(high Dose 65+) 09/23/2021   Fluad Trivalent(High Dose 65+) 11/22/2022   Influenza Split 10/12/2011   Influenza Whole 10/13/2009   Influenza,inj,Quad PF,6+ Mos 11/01/2012, 09/16/2013, 11/20/2014, 10/27/2015, 11/28/2016, 09/26/2017, 10/30/2018, 09/18/2019   Pneumococcal Conjugate-13 12/20/2013   Pneumococcal Polysaccharide-23 10/30/2018   Td 06/24/2009   Tdap 11/17/2010    Screening Tests Health Maintenance  Topic Date Due   Zoster Vaccines- Shingrix (1 of 2) Never done   DTaP/Tdap/Td (3 - Td or Tdap) 11/16/2020   DEXA SCAN  09/04/2022   MAMMOGRAM  09/30/2022   COVID-19 Vaccine (1) 12/06/2023 (Originally 07/13/1960)   INFLUENZA VACCINE  08/18/2023   Pneumococcal Vaccine: 50+ Years (3 of 3 - PCV20 or PCV21) 10/30/2023   Medicare Annual Wellness (AWV)  07/04/2024   Colonoscopy  11/10/2024   Hepatitis C Screening  Completed   HPV VACCINES  Aged Out   Meningococcal B Vaccine  Aged Out    Health Maintenance  Health Maintenance Due  Topic Date Due   Zoster Vaccines- Shingrix (1 of 2) Never done   DTaP/Tdap/Td (3 - Td or Tdap) 11/16/2020   DEXA SCAN  09/04/2022   MAMMOGRAM  09/30/2022   Health Maintenance Items Addressed: Mammogram ordered, DEXA ordered  Additional Screening:  Vision Screening: Recommended annual ophthalmology exams for early  detection of glaucoma and other disorders of the eye. Would you like a referral to an eye doctor? No    Dental Screening: Recommended annual dental exams for proper oral hygiene  Community Resource Referral / Chronic Care Management: CRR required this visit?  Yes   CCM required this visit?  No   Plan: Mammogram and Dexa ordered. CRR placed for food insecurities, to see what resources are available to patient to assist with ADL's, and caregiver burden. Patient verbalizes understanding of treatment plan and is in agreement with the plan. They have been provided with a copy of their treatment plan in either a printed letter  mailed to them by request or via mychart.      I have personally reviewed and noted the following in the patient's chart:   Medical and social history Use of alcohol, tobacco or illicit drugs  Current medications and supplements including opioid prescriptions. Patient is currently taking opioid prescriptions. Information provided to patient regarding non-opioid alternatives. Patient advised to discuss non-opioid treatment plan with their provider. Functional ability and status Nutritional status Physical activity Advanced directives List of other physicians Hospitalizations, surgeries, and ER visits in previous 12 months Vitals Screenings to include cognitive, depression, and falls Referrals and appointments  In addition, I have reviewed and discussed with patient certain preventive protocols, quality metrics, and best practice recommendations. A written personalized care plan for preventive services as well as general preventive health recommendations were provided to patient.   Kamden Reber, CMA   07/05/2023   After Visit Summary: (Mail) Due to this being a telephonic visit, the after visit summary with patients personalized plan was offered to patient via mail   Notes: See plan section of this progress note

## 2023-07-06 ENCOUNTER — Telehealth: Payer: Self-pay

## 2023-07-06 NOTE — Progress Notes (Signed)
 Complex Care Management Note Care Guide Note  07/06/2023 Name: Kelly Douglas MRN: [id number] DOB: 1955-06-19   Complex Care Management Outreach Attempts: An unsuccessful telephone outreach was attempted today to offer the patient information about available complex care management services.  Follow Up Plan:  Additional outreach attempts will be made to offer the patient complex care management information and services.   Encounter Outcome:  No Answer  Lenton Rail , RMA     [place]  [facility], [facility] Guide  Direct Dial: [phone]  Website: [email or web address]

## 2023-07-07 NOTE — Progress Notes (Signed)
 Complex Care Management Note  Care Guide Note 07/07/2023 Name: CHEALSEA PASKE MRN: [id number] DOB: [birth year]  Lajoyce Pikes is a 68 y.o. year old female who sees Towanda Fret, MD for primary care. I reached out to Lindy P Doten by phone today to offer complex care management services.  Ms. Massenburg was given information about Complex Care Management services today including:   The Complex Care Management services include support from the care team which includes your Nurse Care Manager, Clinical Social Worker, or Pharmacist.  The Complex Care Management team is here to help remove barriers to the health concerns and goals most important to you. Complex Care Management services are voluntary, and the patient may decline or stop services at any time by request to their care team member.   Complex Care Management Consent Status: Patient agreed to services and verbal consent obtained.   Follow up plan:  Telephone appointment with complex care management team member scheduled for:  07/12/2023  Encounter Outcome:  Patient Scheduled  Lenton Rail , RMA     [place]  [facility], [facility] Guide  Direct Dial: [phone]  Website: [email or web address]

## 2023-07-10 ENCOUNTER — Other Ambulatory Visit: Payer: Self-pay | Admitting: Family Medicine

## 2023-07-10 ENCOUNTER — Inpatient Hospital Stay (HOSPITAL_BASED_OUTPATIENT_CLINIC_OR_DEPARTMENT_OTHER): Payer: Medicare HMO | Admitting: Hematology

## 2023-07-10 VITALS — BP 145/86 | HR 69 | Temp 97.2°F | Resp 18 | Wt 161.8 lb

## 2023-07-10 DIAGNOSIS — F17210 Nicotine dependence, cigarettes, uncomplicated: Secondary | ICD-10-CM | POA: Diagnosis not present

## 2023-07-10 DIAGNOSIS — D508 Other iron deficiency anemias: Secondary | ICD-10-CM | POA: Diagnosis not present

## 2023-07-10 DIAGNOSIS — D509 Iron deficiency anemia, unspecified: Secondary | ICD-10-CM | POA: Diagnosis not present

## 2023-07-10 DIAGNOSIS — Z923 Personal history of irradiation: Secondary | ICD-10-CM | POA: Diagnosis not present

## 2023-07-10 DIAGNOSIS — C3432 Malignant neoplasm of lower lobe, left bronchus or lung: Secondary | ICD-10-CM | POA: Diagnosis not present

## 2023-07-10 DIAGNOSIS — E038 Other specified hypothyroidism: Secondary | ICD-10-CM

## 2023-07-10 DIAGNOSIS — C3492 Malignant neoplasm of unspecified part of left bronchus or lung: Secondary | ICD-10-CM | POA: Diagnosis not present

## 2023-07-10 NOTE — Progress Notes (Signed)
 [facility] [address] [place] [address]    Clinic Day:  07/10/2023  Referring physician: Antonetta Rollene BRAVO, MD  Patient Care Team: Antonetta Rollene BRAVO, MD as PCP - General Rothbart, Lamar HERO, MD (Cardiology) Shaaron Lamar HERO, MD (Gastroenterology) Vivian Greig NOVAK, PTA as Physical Therapy Assistant (Physical Therapy) Karis Clunes, MD as Consulting Physician (Otolaryngology)   ASSESSMENT & PLAN:   Assessment: 1.  Adenocarcinoma of left lower lobe of lung: -She is followed by Dr. Shelah and left lower lobe lung nodule was being watched closely for the last few years. -She had increase in size of the left lower lobe lung nodule recently on CT scan. -PET scan on 12/09/2019 showed 2 cm left lower lobe pulmonary nodule with some solid elements with maximum SUV 4.7.  No adenopathy reported. -Bronchoscopy and biopsy of the left lower lobe nodule consistent with adenocarcinoma.  Left lower lobe brushings also positive for adenocarcinoma.  Left upper lobe brushings did not show any malignant cells. - XRT to the left and right lungs lesions from 11/24/2021 through 12/07/2021   2.  Social/family history: - Current active smoker, half pack per day since age 14. -Lives with husband and brother at home.  Activity is limited secondary to back and hip pain. -Brother had throat cancer and paternal cousin had throat cancer.    Plan: 1.  Clinical stage I adenocarcinoma of left lower lobe of lung: - She is smoking 10-20 cigs/day. - CT chest (02/22/2022): Treated left lower lobe pulmonary nodule is stable to slightly decreased.  Spiculated nodule in the right upper lobe slightly increased in size compared to prior study measuring 8 x 7 mm previously 7 x 5 mm.  Multiple other lung nodules are stable. - We reviewed CT from recent ER visit which showed stable findings. CT chest from 07/03/23 is not read yet. - RTC 6 months with repeat CT and labs.   2.  Microcytic anemia: - Continue  iron tablet daily. Ferritin and Hb are normal.    Orders Placed This Encounter  Procedures   CT CHEST W CONTRAST    Standing Status:   Future    Expected Date:   01/09/2024    Expiration Date:   07/09/2024    If indicated for the ordered procedure, I authorize the administration of contrast media per Radiology protocol:   Yes    Does the patient have a contrast media/X-ray dye allergy?:   No    Preferred imaging location?:   [facility]   CBC with Differential    Standing Status:   Future    Expected Date:   01/08/2024    Expiration Date:   04/07/2024   Comprehensive metabolic panel    Standing Status:   Future    Expected Date:   01/08/2024    Expiration Date:   04/07/2024   Iron and TIBC ([facility] ONLY)    Standing Status:   Future    Expected Date:   01/08/2024    Expiration Date:   04/07/2024   Ferritin    Standing Status:   Future    Expected Date:   01/08/2024    Expiration Date:   04/07/2024      LILLETTE Hummingbird R Teague,acting as a scribe for Alean Stands, MD.,have documented all relevant documentation on the behalf of Alean Stands, MD,as directed by  Alean Stands, MD while in the presence of Alean Stands, MD.  I, Alean Stands MD, have reviewed the  above documentation for accuracy and completeness, and I agree with the above.     Alean Stands, MD   6/23/20254:02 PM  CHIEF COMPLAINT:   Diagnosis: left lung cancer    Cancer Staging  No matching staging information was found for the patient.    Prior Therapy: XRT to bilateral lung lesions, 11/24/21 - 12/07/21  Current Therapy:  surveillance   HISTORY OF PRESENT ILLNESS:   Oncology History   No history exists.     INTERVAL HISTORY:   Kelly Douglas is a 68 y.o. female presenting to clinic today for follow up of left lung cancer. She was last seen by me on 03/02/23.  Since her last visit, she underwent  CT chest on 07/03/23.   Today, she states that she  is doing well overall. Her appetite level is at 100%. Her energy level is at 100%.  PAST MEDICAL HISTORY:   Past Medical History: Past Medical History:  Diagnosis Date   Anxiety    Arteriosclerotic cardiovascular disease (ASCVD) 2013   coronary calcification-left main, LAD and CX; small pericardial effusion   Arthritis    hips   Cancer (HCC)    Lung Cancer    Chronic back pain    Chronic bronchitis    Chronic lung disease    Chronic scarring and volume loss-left lung; characteristics of a chronic infectious process-possible MAI   Depression    Dyspnea    occasional    GERD (gastroesophageal reflux disease)    Dr Shaaron EGD 09/2009->esophagitis, sm HH, antral erosions, atonic esophagus   History of radiation therapy    Left and Right Lung- 11/24/21-12/07/21- Dr. Lynwood Nasuti   Hyperlipidemia    Hypertension    Hypothyroid 1981 approx   Scleroderma (HCC)    Seasonal allergies    Syncope    Multiple spells over the past 40+ years, likely neurocardiogenic   Tobacco abuse 06/24/2009    Surgical History: Past Surgical History:  Procedure Laterality Date   BILATERAL SALPINGOOPHORECTOMY  2013    Dr. Jayne; uterus remains in situ   BRONCHIAL BIOPSY  11/21/2019   Procedure: BRONCHIAL BIOPSIES;  Surgeon: Shelah Lamar RAMAN, MD;  Location: [facility] ENDOSCOPY;  Service: Pulmonary;;   BRONCHIAL BRUSHINGS  11/21/2019   Procedure: BRONCHIAL BRUSHINGS;  Surgeon: Shelah Lamar RAMAN, MD;  Location[facility] ENDOSCOPY;  Service: Pulmonary;;   BRONCHIAL NEEDLE ASPIRATION BIOPSY  11/21/2019   Procedure: BRONCHIAL NEEDLE ASPIRATION BIOPSIES;  Surgeon: Shelah Lamar RAMAN, MD;  Location[facility] ENDOSCOPY;  Service: Pulmonary;;   BRONCHIAL WASHINGS  11/21/2019   Procedure: BRONCHIAL WASHINGS;  Surgeon: Shelah Lamar RAMAN, MD;  Location: [facility] ENDOSCOPY;  Service: Pulmonary;;   COLONOSCOPY  09/28/09   anal papilla otherwise normal   COLONOSCOPY N/A 08/29/2012   MFM:Rnonwpr polyp-removed as described above. tubular adenoma    COLONOSCOPY WITH PROPOFOL  N/A 11/11/2019   Procedure: COLONOSCOPY WITH PROPOFOL ;  Surgeon: Shaaron Lamar HERO, MD;  Location: [facility] ENDO SUITE;  Service: Endoscopy;  Laterality: N/A;  2:30pm   ESOPHAGOGASTRODUODENOSCOPY  10/07/09   Dr. Verneice of esophageal mucosa, diffusely ?esophagitis (bx benign), small HH/antral erosions, erythema bx benign. 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 Hiatal hernia   FUDUCIAL PLACEMENT  10/18/2017   Procedure: PLACEMENT OF 3 FUDUCIAL INTO LEFT LOWER LOBE OF LUNG-TARGET 1;  Surgeon: Shelah Lamar RAMAN, MD;  Location: [facility] OR;  Service: Thoracic;;   LAPAROSCOPIC APPENDECTOMY  07/16/2011  Procedure: APPENDECTOMY LAPAROSCOPIC;  Surgeon: Thresa JAYSON Pulling, MD;  Location: [facility] ORS;  Service: General;  Laterality: N/A;   POLYPECTOMY  11/11/2019   Procedure: POLYPECTOMY;  Surgeon: Shaaron Lamar HERO, MD;  Location: [facility] ENDO SUITE;  Service: Endoscopy;;   TUBAL LIGATION     VIDEO BRONCHOSCOPY  10/18/2017   VIDEO BRONCHOSCOPY WITH ENDOBRONCHIAL NAVIGATION N/A 10/18/2017   Procedure: VIDEO BRONCHOSCOPY WITH ENDOBRONCHIAL NAVIGATION;  Surgeon: Shelah Lamar RAMAN, MD;  Location: [facility] OR;  Service: Thoracic;  Laterality: N/A;   VIDEO BRONCHOSCOPY WITH ENDOBRONCHIAL NAVIGATION Left 11/21/2019   Procedure: VIDEO BRONCHOSCOPY WITH ENDOBRONCHIAL NAVIGATION;  Surgeon: Shelah Lamar RAMAN, MD;  Location: [facility] ENDOSCOPY;  Service: Pulmonary;  Laterality: Left;    Social History: Social History   Socioeconomic History   Marital status: Married    Spouse name: Not on file   Number of children: 2   Years of education: Not on file   Highest education level: Not on file  Occupational History   Occupation: disabled    Employer: NOT EMPLOYED  Tobacco Use   Smoking status: Every Day    Current packs/day: 0.50    Average  packs/day: 0.5 packs/day for 52.0 years (26.0 ttl pk-yrs)    Types: Cigarettes    Start date: 07/14/1971   Smokeless tobacco: Never   Tobacco comments:    0.5 packs of cigarettes smoked daily ARJ 12/31/19  Vaping Use   Vaping status: Never Used  Substance and Sexual Activity   Alcohol use: Not Currently    Alcohol/week: 0.0 standard drinks of alcohol    Comment: quit June 2012-used to drink 6 pack daily, occasional beer per pt 11/20/19   Drug use: No   Sexual activity: Yes    Birth control/protection: Post-menopausal  Other Topics Concern   Not on file  Social History Narrative   Not on file   Social Drivers of Health   Financial Resource Strain: High Risk (07/05/2023)   Overall Financial Resource Strain (CARDIA)    Difficulty of Paying Living Expenses: Hard  Food Insecurity: Food Insecurity Present (07/05/2023)   Hunger Vital Sign    Worried About Running Out of Food in the Last Year: Often true    Ran Out of Food in the Last Year: Never true  Transportation Needs: No Transportation Needs (07/05/2023)   PRAPARE - Administrator, Civil Service (Medical): No    Lack of Transportation (Non-Medical): No  Physical Activity: Inactive (07/05/2023)   Exercise Vital Sign    Days of Exercise per Week: 0 days    Minutes of Exercise per Session: 0 min  Stress: No Stress Concern Present (07/05/2023)   [employer] of Occupational Health - Occupational Stress Questionnaire    Feeling of Stress: Not at all  Social Connections: Moderately Integrated (07/05/2023)   Social Connection and Isolation Panel    Frequency of Communication with Friends and Family: More than three times a week    Frequency of Social Gatherings with Friends and Family: Once a week    Attends Religious Services: More than 4 times per year    Active Member of [employer] or Organizations: No    Attends Banker Meetings: Never    Marital Status: Married  Catering manager Violence: Not At Risk  (07/05/2023)   Humiliation, Afraid, Rape, and Kick questionnaire    Fear of Current or Ex-Partner: No    Emotionally Abused: No    Physically Abused: No    Sexually Abused: No  Family History: Family History  Problem Relation Age of Onset   Aneurysm Mother    Stroke Father    Aneurysm Father    Hypertension Sister    Cancer Brother        throat cancer   Coronary artery disease Brother    Hypertension Brother    Down syndrome Brother    Hypertension Brother    Hypertension Brother        father/mother   Throat cancer Brother 49       throat cancer   Diabetes Brother    Diabetes Brother    Anesthesia problems Neg Hx    Hypotension Neg Hx    Malignant hyperthermia Neg Hx    Pseudochol deficiency Neg Hx    Colon cancer Neg Hx     Current Medications:  Current Outpatient Medications:    acetaminophen  (TYLENOL ) 650 MG CR tablet, Take 1,300 mg by mouth every 8 (eight) hours as needed for pain. , Disp: , Rfl:    albuterol  (VENTOLIN  HFA) 108 (90 Base) MCG/ACT inhaler, Inhale 1-2 puffs into the lungs every 6 (six) hours as needed for wheezing or shortness of breath., Disp: 18 each, Rfl: 1   amLODipine  (NORVASC ) 5 MG tablet, TAKE 1 TABLET EVERY DAY, Disp: 90 tablet, Rfl: 3   atorvastatin  (LIPITOR) 10 MG tablet, TAKE 1 TABLET EVERY DAY, Disp: 90 tablet, Rfl: 3   DULoxetine  (CYMBALTA ) 60 MG capsule, TAKE 1 CAPSULE EVERY DAY, Disp: 90 capsule, Rfl: 3   famotidine  (PEPCID ) 20 MG tablet, TAKE 1 TABLET EVERY DAY, Disp: 90 tablet, Rfl: 3   ferrous sulfate  325 (65 FE) MG EC tablet, Take 325 mg by mouth daily with breakfast., Disp: , Rfl:    hydrochlorothiazide  (HYDRODIURIL ) 12.5 MG tablet, Take 1 tablet (12.5 mg total) by mouth daily for 14 days. (Patient not taking: Reported on 07/05/2023), Disp: 14 tablet, Rfl: 0   HYDROcodone -acetaminophen  (NORCO/VICODIN) 5-325 MG tablet, Take 1 tablet by mouth every 6 (six) hours as needed for moderate pain (pain score 4-6). (Patient not taking:  Reported on 07/05/2023), Disp: 10 tablet, Rfl: 0   levothyroxine  (SYNTHROID ) 75 MCG tablet, TAKE 1 TABLET EVERY OTHER DAY ALTERNATING WITH 1/2 TABLET EVERY OTHER DAY, Disp: 66 tablet, Rfl: 3   meloxicam  (MOBIC ) 7.5 MG tablet, TAKE 1 TABLET EVERY DAY, Disp: 90 tablet, Rfl: 3   mirtazapine  (REMERON ) 7.5 MG tablet, TAKE 1 TABLET AT BEDTIME, Disp: 90 tablet, Rfl: 3   montelukast  (SINGULAIR ) 10 MG tablet, TAKE 1 TABLET AT BEDTIME, Disp: 90 tablet, Rfl: 3   Multiple Vitamin (MULTIVITAMIN WITH MINERALS) TABS tablet, Take 1 tablet by mouth daily., Disp: , Rfl:    pantoprazole  (PROTONIX ) 40 MG tablet, TAKE 1 TABLET TWICE DAILY, Disp: 180 tablet, Rfl: 3   Allergies: Allergies  Allergen Reactions   Aspirin  Other (See Comments)    Nose bleeds    REVIEW OF SYSTEMS:   Review of Systems  Constitutional:  Negative for chills, fatigue and fever.  HENT:   Negative for lump/mass, mouth sores, nosebleeds, sore throat and trouble swallowing.   Eyes:  Negative for eye problems.  Respiratory:  Positive for cough and shortness of breath.   Cardiovascular:  Negative for chest pain, leg swelling and palpitations.  Gastrointestinal:  Positive for constipation and diarrhea. Negative for abdominal pain, nausea and vomiting.  Genitourinary:  Negative for bladder incontinence, difficulty urinating, dysuria, frequency, hematuria and nocturia.   Musculoskeletal:  Negative for arthralgias, back pain, flank pain, myalgias and neck pain.  Skin:  Negative for itching and rash.  Neurological:  Negative for dizziness, headaches and numbness.  Hematological:  Does not bruise/bleed easily.  Psychiatric/Behavioral:  Negative for depression, sleep disturbance and suicidal ideas. The patient is not nervous/anxious.   All other systems reviewed and are negative.    VITALS:   Blood pressure (!) 145/86, pulse 69, temperature (!) 97.2 F (36.2 C), temperature source Oral, resp. rate 18, weight 161 lb 13.1 oz (73.4 kg), SpO2 98%.   Wt Readings from Last 3 Encounters:  07/10/23 161 lb 13.1 oz (73.4 kg)  07/05/23 161 lb 6.4 oz (73.2 kg)  06/12/23 170 lb (77.1 kg)    Body mass index is 24.6 kg/m.  Performance status (ECOG): 1 - Symptomatic but completely ambulatory  PHYSICAL EXAM:   Physical Exam Vitals and nursing note reviewed. Exam conducted with a chaperone present.  Constitutional:      Appearance: Normal appearance.   Cardiovascular:     Rate and Rhythm: Normal rate and regular rhythm.     Pulses: Normal pulses.     Heart sounds: Normal heart sounds.  Pulmonary:     Effort: Pulmonary effort is normal.     Breath sounds: Normal breath sounds.  Abdominal:     Palpations: Abdomen is soft. There is no hepatomegaly, splenomegaly or mass.     Tenderness: There is no abdominal tenderness.   Musculoskeletal:     Right lower leg: Edema present.     Left lower leg: Edema present.  Lymphadenopathy:     Cervical: No cervical adenopathy.     Right cervical: No superficial, deep or posterior cervical adenopathy.    Left cervical: No superficial, deep or posterior cervical adenopathy.     Upper Body:     Right upper body: No supraclavicular or axillary adenopathy.     Left upper body: No supraclavicular or axillary adenopathy.   Neurological:     General: No focal deficit present.     Mental Status: She is alert and oriented to person, place, and time.   Psychiatric:        Mood and Affect: Mood normal.        Behavior: Behavior normal.     LABS:      Latest Ref Rng & Units 07/03/2023    1:10 PM 02/23/2023    9:35 AM 10/13/2022    3:18 PM  CBC  WBC 4.0 - 10.5 K/uL 11.7  9.6  12.0   Hemoglobin 12.0 - 15.0 g/dL 86.5  86.4  86.8   Hematocrit 36.0 - 46.0 % 41.9  42.7  40.0   Platelets 150 - 400 K/uL 378  327  346       Latest Ref Rng & Units 07/03/2023    1:10 PM 06/12/2023    4:40 PM 02/23/2023    9:35 AM  CMP  Glucose 70 - 99 mg/dL 894  90  898   BUN 8 - 23 mg/dL 11  9  8    Creatinine 0.44 -  1.00 mg/dL 9.03  8.95  9.05   Sodium 135 - 145 mmol/L 140  134  138   Potassium 3.5 - 5.1 mmol/L 3.7  3.8  3.8   Chloride 98 - 111 mmol/L 104  102  103   CO2 22 - 32 mmol/L 25  23  25    Calcium  8.9 - 10.3 mg/dL 9.7  9.6  9.6   Total Protein 6.5 - 8.1 g/dL 7.8   7.8   Total Bilirubin  0.0 - 1.2 mg/dL 0.8   0.9   Alkaline Phos 38 - 126 U/L 108   87   AST 15 - 41 U/L 28   40   ALT 0 - 44 U/L 25   38      No results found for: CEA1, CEA / No results found for: CEA1, CEA No results found for: PSA1 No results found for: CAN199 No results found for: CAN125  No results found for: STEPHANY RINGS, A1GS, A2GS, BETS, BETA2SER, GAMS, MSPIKE, SPEI Lab Results  Component Value Date   TIBC 367 07/03/2023   TIBC 417 02/23/2023   TIBC 465 (H) 10/13/2022   FERRITIN 55 07/03/2023   FERRITIN 53 02/23/2023   FERRITIN 18 10/13/2022   IRONPCTSAT 24 07/03/2023   IRONPCTSAT 36 (H) 02/23/2023   IRONPCTSAT 14 10/13/2022   No results found for: LDH   STUDIES:   CT Angio Chest PE W and/or Wo Contrast Result Date: 06/12/2023 CLINICAL DATA:  Pulmonary embolism (PE) suspected, low to intermediate prob, positive D-dimer Foot swelling. Shortness of breath with exertion. Patient with history of lung cancer post radiation. EXAM: CT ANGIOGRAPHY CHEST WITH CONTRAST TECHNIQUE: Multidetector CT imaging of the chest was performed using the standard protocol during bolus administration of intravenous contrast. Multiplanar CT image reconstructions and MIPs were obtained to evaluate the vascular anatomy. RADIATION DOSE REDUCTION: This exam was performed according to the departmental dose-optimization program which includes automated exposure control, adjustment of the mA and/or kV according to patient size and/or use of iterative reconstruction technique. CONTRAST:  75mL OMNIPAQUE  IOHEXOL  350 MG/ML SOLN COMPARISON:  Radiograph earlier today. Noncontrast chest CT 02/23/2023  FINDINGS: Cardiovascular: There are no filling defects within the pulmonary arteries to suggest pulmonary embolus. The left-sided pulmonary arteries are attenuated but no intraluminal filling defects. Stable leftward mediastinal and heart deviation. Coronary artery calcifications. Aortic atherosclerosis without acute aortic findings. No pericardial effusion. Mediastinum/Nodes: No enlarged mediastinal or hilar lymph nodes. Dilated patulous esophagus with layering intraluminal fluid distally. Mild wall thickening at the gastroesophageal junction. No visible thyroid  nodule. Lungs/Pleura: Treated left lower lobe pulmonary nodule measuring 14 x 13 mm, series 6, image 99, unchanged from prior exam. Surrounding post treatment related changes in the lower lobe without significant interval change. Again seen multiple pulmonary nodules. This includes a spiculated left upper lobe nodule measuring 7 x 5 mm, series 6, image 42, unchanged allowing for differences in technique. Spiculated left upper lobe nodule measuring 8 x 6 mm, series 6, image 48, unchanged. Poorly defined ground-glass nodule in the right upper lobe measuring 10 mm series 6, image 65, unchanged. Chronic pleuroparenchymal scarring at the left lung apex as well as tree in bud opacity in the left upper lobe, unchanged from prior exam. No definite new airspace disease. No significant pleural effusion. Emphysema and bronchial thickening again seen. Upper Abdomen: No acute findings. Musculoskeletal: No suspicious bone lesion. No acute osseous findings. Review of the MIP images confirms the above findings. IMPRESSION: 1. No pulmonary embolus. 2. No acute intrathoracic abnormality. 3. Stable left lower lobe pulmonary nodule with surrounding post treatment related changes. 4. Multiple additional pulmonary nodules are unchanged, including the right upper lobe nodule that was increasing in size. Continued oncologic follow-up is recommended. 5. Dilated patulous esophagus  with layering intraluminal fluid distally. Mild wall thickening at the gastroesophageal junction. Aortic Atherosclerosis (ICD10-I70.0) and Emphysema (ICD10-J43.9). Electronically Signed   By: Andrea Gasman M.D.   On: 06/12/2023 20:09   DG Hip Unilat W or Wo  Pelvis 2-3 Views Right Result Date: 06/12/2023 CLINICAL DATA:  Hip pain for 3 weeks.  History of lung cancer. EXAM: DG HIP (WITH OR WITHOUT PELVIS) 2-3V RIGHT COMPARISON:  Head CT 10/21/2021 FINDINGS: No evidence of acute fracture of the pelvis or right hip. No hip dislocation. Hip joint spaces preserved. No evidence of lytic or destructive bone lesion. No blastic lesion. Slight irregularity of the upper aspect of the right pubic body is chronic based on prior PET. No focal soft tissue abnormalities. IMPRESSION: No acute findings or explanation for hip pain. No radiographic evidence of metastatic disease. Electronically Signed   By: Andrea Gasman M.D.   On: 06/12/2023 17:09   DG Chest 2 View Result Date: 06/12/2023 CLINICAL DATA:  Swelling lower extremities. Shortness of breath. History of lung cancer with radiation EXAM: CHEST - 2 VIEW COMPARISON:  Chest x-ray 01/19/2022.  CT scan chest 02/23/2023. FINDINGS: Right lung is grossly clear. No consolidation, pneumothorax or effusion. Lung nodule seen on prior CT are not as well appreciated on this examination by x-ray. There is significant shift of the mediastinum from right to left with volume loss along the left hemithorax. The shift appears to be increased compared to the prior CT scan. Subtle opacities in left lung are again seen as on prior. No pneumothorax or effusion. IMPRESSION: Posttreatment changes along left hemithorax with shift of the mediastinum from right to left which appears slightly more accentuated on today's examination on prior exam. Subtle opacity left lung are again noted. Please correlate with symptoms and if needed repeat CT scan for more direct comparison. Electronically Signed    By: Ranell Bring M.D.   On: 06/12/2023 17:02

## 2023-07-10 NOTE — Patient Instructions (Addendum)
 [facility] at [facility] Discharge Instructions   You were seen and examined today by Dr. Rogers.  He reviewed the results of your lab work which are normal/stable.   He reviewed the images of your CT scan which did not show any growth.   We will see you back in 6 months. We will repeat a CT scan and lab work prior to your next visit.    Return as scheduled.    Thank you for choosing [facility] at [facility] to provide your oncology and hematology care.  To afford each patient quality time with our provider, please arrive at least 15 minutes before your scheduled appointment time.   If you have a lab appointment with the Cancer Center please come in thru the Main Entrance and check in at the main information desk.  You need to re-schedule your appointment should you arrive 10 or more minutes late.  We strive to give you quality time with our providers, and arriving late affects you and other patients whose appointments are after yours.  Also, if you no show three or more times for appointments you may be dismissed from the clinic at the providers discretion.     Again, thank you for choosing [facility].  Our hope is that these requests will decrease the amount of time that you wait before being seen by our physicians.       _____________________________________________________________  Should you have questions after your visit to [facility], please contact our office at [phone] and follow the prompts.  Our office hours are 8:00 a.m. and 4:30 p.m. Monday - Friday.  Please note that voicemails left after 4:00 p.m. may not be returned until the following business day.  We are closed weekends and major holidays.  You do have access to a nurse 24-7, just call the main number to the clinic [phone] and do not press any options, hold on the line and a nurse will answer the phone.    For prescription refill  requests, have your pharmacy contact our office and allow 72 hours.    Due to Covid, you will need to wear a mask upon entering the hospital. If you do not have a mask, a mask will be given to you at the Main Entrance upon arrival. For doctor visits, patients may have 1 support person age 61 or older with them. For treatment visits, patients can not have anyone with them due to social distancing guidelines and our immunocompromised population.

## 2023-07-11 ENCOUNTER — Encounter (HOSPITAL_COMMUNITY): Payer: Self-pay | Admitting: Family Medicine

## 2023-07-12 ENCOUNTER — Other Ambulatory Visit: Payer: Self-pay | Admitting: Licensed Clinical Social Worker

## 2023-07-12 NOTE — Patient Instructions (Addendum)
 Visit Information  Thank you for taking time to visit with me today. Please don't hesitate to contact me if I can be of assistance to you before our next scheduled appointment.  Our next appointment is by telephone on 08/01/23 at 1 pm Please call the care guide team at [phone] if you need to cancel or reschedule your appointment.   Following is a copy of your care plan:   Goals Addressed             This Visit's Progress    LCSW-VBCI Social Work Care Plan       Problems:   Financial constraints related to managing health care expenses for self and family, Disease Management support and education needs related to Insomnia/Sleep Difficulties, Anxiety and Caregiver strain, and Lacks knowledge of how to connect   CSW Clinical Goal(s):   Over the next 90 days the Patient will attend all scheduled medical appointments as evidenced by patient report and care team review of appointment completion in electronic MEDICAL RECORD NUMBERby VBCI LCSW demonstrate improved health management independence as evidenced by increasing self care and explore community resource options for unmet needs related to [employer] , Food Insecurity , Social Connections, Stress, and Transportation.  Interventions:  Mental Health:  Evaluation of current treatment plan related to Caregiver Stress and Insomnia/Sleep Difficulties Active listening / Reflection utilized Behavioral Activation reviewed Caregiver stress acknowledged :patient denied needing caregiver support group information Consideration on in-home help encouraged : options discussed Crisis Resource Education / information provided Depression screen reviewed Discussed caregiver resources and support: spouse is already active in [facility] but other caregiver support resources were provided Discussed referral for psychiatry: Pt declined wanting medication management for anxiety/stress and insomnia. Discussed referral options to connect for ongoing  therapy: Pt declined need for grief therapy referral Emotional Support Provided Mindfulness or Relaxation training provided Motivational Interviewing employed PHQ2/PHQ9 completed Problem Solving /Task Center strategies reviewed Provided general psycho-education for mental health needs Quality of sleep assessed & Sleep Hygiene techniques promoted Solution-Focued Strategies employed: Suicidal Ideation/Homicidal Ideation assessed: No SI/HI  Patient Goals/Self-Care Activities:  Continue taking your medication as prescribed.   Increase coping skills, healthy habits, self-management skills, and stress reduction Review educational material on food pantries, transportation and caregiver support resources in your area that was sent by mail on 07/12/23.  Plan:   Telephone follow up appointment with care management team member scheduled for:  07/27/23 at 1 pm        Please call the Suicide and Crisis Lifeline: 988 call the [facility]: [phone] if you are experiencing a Mental Health or Behavioral Health Crisis or need someone to talk to.  Patient verbalizes understanding of instructions and care plan provided today and agrees to view in MyChart. Active MyChart status and patient understanding of how to access instructions and care plan via MyChart confirmed with patient.     Lyle Rung, BSW, MSW, LCSW Licensed Clinical Social Worker [employer]   [email or web address] Direct Dial: [phone]

## 2023-07-12 NOTE — Patient Outreach (Addendum)
 Complex Care Management   Visit Note  07/12/2023  Name:  Kelly Douglas MRN: [id number] DOB: [birth year]  Situation: Referral received for Complex Care Management related to caregiver strain and stress management concerns I obtained verbal consent from Patient. Visit completed with VBCI LCSW  on the phone  Background:   Past Medical History:  Diagnosis Date   Anxiety    Arteriosclerotic cardiovascular disease (ASCVD) 2013   coronary calcification-left main, LAD and CX; small pericardial effusion   Arthritis    hips   Cancer (HCC)    Lung Cancer    Chronic back pain    Chronic bronchitis    Chronic lung disease    Chronic scarring and volume loss-left lung; characteristics of a chronic infectious process-possible MAI   Depression    Dyspnea    occasional    GERD (gastroesophageal reflux disease)    Dr Shaaron EGD 09/2009->esophagitis, sm HH, antral erosions, atonic esophagus   History of radiation therapy    Left and Right Lung- 11/24/21-12/07/21- Dr. Lynwood Nasuti   Hyperlipidemia    Hypertension    Hypothyroid 1981 approx   Scleroderma (HCC)    Seasonal allergies    Syncope    Multiple spells over the past 40+ years, likely neurocardiogenic   Tobacco abuse 06/24/2009    Assessment: Patient Reported Symptoms:  Cognitive Cognitive Status: Able to follow simple commands, Alert and oriented to person, place, and time, Normal speech and language skills Cognitive/Intellectual Conditions Management [RPT]: None reported or documented in medical history or problem list   Health Maintenance Behaviors: Annual physical exam Healing Pattern: Average Health Facilitated by: Rest, Pain control  Neurological Neurological Review of Symptoms: No symptoms reported    HEENT HEENT Symptoms Reported: No symptoms reported      Cardiovascular Cardiovascular Symptoms Reported: Swelling in legs or feet Does patient have uncontrolled Hypertension?: No Cardiovascular Conditions:  Hypertension Cardiovascular Management Strategies: Medication therapy, Adequate rest Cardiovascular Self-Management Outcome: 4 (good)  Respiratory Respiratory Symptoms Reported: No symptoms reported Respiratory Conditions: COPD Respiratory Self-Management Outcome: 4 (good)  Endocrine Patient reports the following symptoms related to hypoglycemia or hyperglycemia : No symptoms reported Is patient diabetic?: No    Gastrointestinal Gastrointestinal Symptoms Reported: No symptoms reported      Genitourinary Genitourinary Symptoms Reported: No symptoms reported    Integumentary Integumentary Symptoms Reported: No symptoms reported    Musculoskeletal Musculoskelatal Symptoms Reviewed: Muscle pain Additional Musculoskeletal Details: Pt has upcoming appointment to discuss with PCP on 07/18/23 Musculoskeletal Conditions: Back pain Musculoskeletal Management Strategies: Coping strategies, Routine screening, Adequate rest Musculoskeletal Self-Management Outcome: 2 (bad) Falls in the past year?: No Number of falls in past year: 1 or less Was there an injury with Fall?: No Fall Risk Category Calculator: 0 Patient Fall Risk Level: Low Fall Risk    Psychosocial Psychosocial Symptoms Reported: Anxiety - if selected complete GAD, Depression - if selected complete PHQ 2-9 Behavioral Health Conditions: Anxiety Behavioral Management Strategies: Adequate rest, Community resources, Coping strategies, Support system Behavioral Health Self-Management Outcome: 4 (good) Behavioral Health Comment: Pt has reported grief over the loss of her brothers and sister over the past few years but denies needing a behavioral health referral at this time for therapy or psychiatry. She was provided coping skill education for sterss management and healthy living. Major Change/Loss/Stressor/Fears (CP): Death of a loved one Behaviors When Feeling Stressed/Fearful: I don't get down often but when sometimes my anxiety gets  triggered when I got sleep good. Techniques to Cope with  Loss/Stress/Change: Spiritual practice(s), Diversional activities Quality of Family Relationships: helpful, involved Do you feel physically threatened by others?: No      07/12/2023    9:46 AM  Depression screen PHQ 2/9  Decreased Interest 0  Down, Depressed, Hopeless 0  PHQ - 2 Score 0  Altered sleeping 3  Tired, decreased energy 0  Change in appetite 0  Feeling bad or failure about yourself  0  Trouble concentrating 0  Moving slowly or fidgety/restless 0  Suicidal thoughts 0  PHQ-9 Score 3  Difficult doing work/chores Somewhat difficult      07/12/2023   10:02 AM 05/22/2023    3:56 PM 11/22/2022    4:03 PM 09/27/2022    4:02 PM  GAD 7 : Generalized Anxiety Score  Nervous, Anxious, on Edge 0 0 0 3  Control/stop worrying 0 0 0 3  Worry too much - different things 0 0 0 3  Trouble relaxing 1 0 0 3  Restless 0 0 0 0  Easily annoyed or irritable 0 0 1 2  Afraid - awful might happen 0 0 0 0  Total GAD 7 Score 1 0 1 14  Anxiety Difficulty Not difficult at all Not difficult at all Not difficult at all     SDOH Screenings   Food Insecurity: Food Insecurity Present (07/12/2023)  Housing: Low Risk  (07/12/2023)  Recent Concern: Housing - High Risk (07/05/2023)  Transportation Needs: No Transportation Needs (07/12/2023)  Utilities: Not At Risk (07/12/2023)  Alcohol Screen: Low Risk  (07/05/2023)  Depression (PHQ2-9): Low Risk  (07/12/2023)  Financial Resource Strain: High Risk (07/12/2023)  Physical Activity: Inactive (07/05/2023)  Social Connections: Moderately Integrated (07/12/2023)  Stress: No Stress Concern Present (07/12/2023)  Tobacco Use: High Risk (07/05/2023)  Health Literacy: Adequate Health Literacy (07/05/2023)   There were no vitals filed for this visit.  Medications Reviewed Today     Reviewed by Merlynn Lyle CROME, LCSW (Social Worker) on 07/12/23 at [phone]  Med List Status: <None>   Medication Order Taking? Sig  Documenting Provider Last Dose Status Informant  acetaminophen  (TYLENOL ) 650 MG CR tablet [id number]  Take 1,300 mg by mouth every 8 (eight) hours as needed for pain.  [provider]  Active Self           Med Note DORNA, MARIAN A   Wed Jul 05, 2023  4:13 PM)    albuterol  (VENTOLIN  HFA) 108 (90 Base) MCG/ACT inhaler [id number]  Inhale 1-2 puffs into the lungs every 6 (six) hours as needed for wheezing or shortness of breath. Shannon Agent, MD  Active Self  amLODipine  (NORVASC ) 5 MG tablet [id number]  TAKE 1 TABLET EVERY DAY Antonetta Rollene BRAVO, MD  Active Self  atorvastatin  (LIPITOR) 10 MG tablet [id number]  TAKE 1 TABLET EVERY DAY Antonetta Rollene BRAVO, MD  Active Self  DULoxetine  (CYMBALTA ) 60 MG capsule [id number]  TAKE 1 CAPSULE EVERY DAY Antonetta Rollene BRAVO, MD  Active Self  famotidine  (PEPCID ) 20 MG tablet [id number]  TAKE 1 TABLET EVERY DAY Antonetta Rollene BRAVO, MD  Active Self           Med Note DORNA, MARIAN A   Wed Jul 05, 2023  4:06 PM)    ferrous sulfate  325 (65 FE) MG EC tablet [id number]  Take 325 mg by mouth daily with breakfast. [provider]  Active Self  hydrochlorothiazide  (HYDRODIURIL ) 12.5 MG tablet [id number]  Take 1 tablet (12.5 mg total) by mouth daily for 14 days.  Patient not  taking: Reported on 07/05/2023   Bevely Doffing, FNP  Expired 06/12/23 2359 Self  HYDROcodone -acetaminophen  (NORCO/VICODIN) 5-325 MG tablet [id number]  Take 1 tablet by mouth every 6 (six) hours as needed for moderate pain (pain score 4-6).  Patient not taking: Reported on 07/05/2023   Nivia Colon, PA-C  Active   levothyroxine  (SYNTHROID ) 75 MCG tablet [id number]  TAKE 1 TABLET EVERY OTHER DAY ALTERNATING WITH 1/2 TABLET EVERY OTHER DAY Antonetta Rollene BRAVO, MD  Active   meloxicam  (MOBIC ) 7.5 MG tablet [id number]  TAKE 1 TABLET EVERY DAY Antonetta Rollene BRAVO, MD  Active Self  mirtazapine  (REMERON ) 7.5 MG tablet [id number]  TAKE 1 TABLET AT BEDTIME Patel, Rutwik K, MD  Active Self   montelukast  (SINGULAIR ) 10 MG tablet [id number]  TAKE 1 TABLET AT BEDTIME Antonetta Rollene BRAVO, MD  Active Self  Multiple Vitamin (MULTIVITAMIN WITH MINERALS) TABS tablet [id number]  Take 1 tablet by mouth daily. [provider]  Active Self  pantoprazole  (PROTONIX ) 40 MG tablet [id number]  TAKE 1 TABLET TWICE DAILY Antonetta Rollene BRAVO, MD  Active Self  Med List Note (Ward, Angelica, CPhT 06/12/23 1919): Send long term meds to [facility] (90 day supply) and then send short term meds to [facility]             Recommendation:   PCP Follow-up Continue Current Plan of Care Review community resources that were sent to your address by mail  Follow Up Plan:   Telephone follow-up in 1 month  Lyle Rung, BSW, MSW, LCSW Licensed Clinical Social Worker [employer]   [email or web address] Direct Dial: [phone]

## 2023-07-18 ENCOUNTER — Ambulatory Visit (INDEPENDENT_AMBULATORY_CARE_PROVIDER_SITE_OTHER): Payer: Self-pay | Admitting: Family Medicine

## 2023-07-18 ENCOUNTER — Encounter: Payer: Self-pay | Admitting: Family Medicine

## 2023-07-18 VITALS — BP 147/82 | HR 102 | Resp 18 | Ht 68.0 in | Wt 162.0 lb

## 2023-07-18 DIAGNOSIS — F17219 Nicotine dependence, cigarettes, with unspecified nicotine-induced disorders: Secondary | ICD-10-CM | POA: Diagnosis not present

## 2023-07-18 DIAGNOSIS — K2100 Gastro-esophageal reflux disease with esophagitis, without bleeding: Secondary | ICD-10-CM

## 2023-07-18 DIAGNOSIS — E038 Other specified hypothyroidism: Secondary | ICD-10-CM | POA: Diagnosis not present

## 2023-07-18 DIAGNOSIS — R600 Localized edema: Secondary | ICD-10-CM

## 2023-07-18 DIAGNOSIS — E559 Vitamin D deficiency, unspecified: Secondary | ICD-10-CM

## 2023-07-18 DIAGNOSIS — M25551 Pain in right hip: Secondary | ICD-10-CM

## 2023-07-18 DIAGNOSIS — F411 Generalized anxiety disorder: Secondary | ICD-10-CM | POA: Diagnosis not present

## 2023-07-18 DIAGNOSIS — F17210 Nicotine dependence, cigarettes, uncomplicated: Secondary | ICD-10-CM | POA: Diagnosis not present

## 2023-07-18 DIAGNOSIS — E782 Mixed hyperlipidemia: Secondary | ICD-10-CM | POA: Diagnosis not present

## 2023-07-18 DIAGNOSIS — I10 Essential (primary) hypertension: Secondary | ICD-10-CM

## 2023-07-18 DIAGNOSIS — M25561 Pain in right knee: Secondary | ICD-10-CM

## 2023-07-18 DIAGNOSIS — G8929 Other chronic pain: Secondary | ICD-10-CM | POA: Insufficient documentation

## 2023-07-18 DIAGNOSIS — M25552 Pain in left hip: Secondary | ICD-10-CM

## 2023-07-18 MED ORDER — ALBUTEROL SULFATE HFA 108 (90 BASE) MCG/ACT IN AERS: 1.0000 | INHALATION_SPRAY | Freq: Four times a day (QID) | RESPIRATORY_TRACT | 3 refills | Status: DC | PRN

## 2023-07-18 MED ORDER — HYDROCHLOROTHIAZIDE 12.5 MG PO TABS: ORAL_TABLET | ORAL | 2 refills | Status: DC

## 2023-07-18 MED ORDER — POTASSIUM CHLORIDE CRYS ER 10 MEQ PO TBCR: 10.0000 meq | EXTENDED_RELEASE_TABLET | Freq: Two times a day (BID) | ORAL | 2 refills | Status: DC

## 2023-07-18 NOTE — Assessment & Plan Note (Signed)
 Asked:confirms currently smokes cigarettes 1 PPD Assess: Unwilling to set a quit date, not cutting back Advise: needs to QUIT to reduce risk of cancer, cardio and cerebrovascular disease Assist: counseled for 5 minutes and literature provided Arrange: follow up in 2 to 4 months

## 2023-07-18 NOTE — Assessment & Plan Note (Signed)
 Microzide  12. 5 mg daily

## 2023-07-18 NOTE — Progress Notes (Signed)
 Kelly Douglas     MRN: [id number]      DOB: 22-Jan-1955  Chief Complaint  Patient presents with   Foot Swelling    Complains of bilateral feet and ankle swelling x2 months    Flank Pain    Complains of right flank pain x3 months, getting worse. No known injury.     HPI Kelly Douglas is here for follow up and re-evaluation of chronic medical conditions, medication management and review of any available recent lab and radiology data.  Preventive health is updated, specifically  Cancer screening and Immunization.   Questions or concerns regarding consultations or procedures which the PT has had in the interim are  addressed. The PT denies any adverse reactions to current medications since the last visit.  C/o bil;lateral hip and rigth knee pain, states intra articular injections have jhelped in the past ROS Denies recent fever or chills. Denies sinus pressure, nasal congestion, ear pain or sore throat. Denies chest congestion, productive cough or wheezing. Denies chest pains, palpitations and leg swelling Denies abdominal pain, nausea, vomiting,diarrhea or constipation.   Denies dysuria, frequency, hesitancy or incontinence. Denies joint pain, swelling and limitation in mobility. Denies headaches, seizures, s, or tingling. Denies depression, c/o  anxiety lost sibling in recent time. Chronic  insomnia. Denies skin break down or rash.   PE  BP (!) 147/82   Pulse (!) 102   Resp 18   Ht 5' 8 (1.727 m)   Wt 162 lb 0.6 oz (73.5 kg)   LMP  (LMP Unknown)   SpO2 97%   BMI 24.64 kg/m   Patient alert and oriented and in no cardiopulmonary distress.  HEENT: No facial asymmetry, EOMI,     Neck supple .  Chest: Clear to auscultation bilaterally.  CVS: S1, S2 no murmurs, no S3.Regular rate.  ABD: Soft non tender.   Ext: trace  edema  MS: decreased  ROM spine, , hips and knees.  Skin: Intact, no ulcerations or rash noted.  Psych: Good eye contact, normal affect. Memory  intact not anxious or depressed appearing.  CNS: CN 2-12 intact, power,  normal throughout.no focal deficits noted.   Assessment & Plan  HTN (hypertension) Uncontrolled , resume microzide  12. mg with 20 meq portassium daily DASH diet and commitment to daily physical activity for a minimum of 30 minutes discussed and encouraged, as a part of hypertension management. The importance of attaining a healthy weight is also discussed.     07/18/2023    8:02 AM 07/10/2023    2:55 PM 07/05/2023    4:05 PM 06/12/2023    8:40 PM 06/12/2023    7:00 PM 06/12/2023    4:25 PM 06/12/2023    4:24 PM  BP/Weight  Systolic BP 147 145 -- 126 115  141  Diastolic BP 82 86 -- 89 71  81  Wt. (Lbs) 162.04 161.82 161.4   170   BMI 24.64 kg/m2 24.6 kg/m2 24.54 kg/m2   25.85 kg/m2        GAD (generalized anxiety disorder) I'm therapy, improved opveral;l;  Bilateral edema of lower extremity Microzide  12. 5 mg daily  GERD (gastroesophageal reflux disease) Controlled, no change in medication   Hyperlipidemia Hyperlipidemia:Low fat diet discussed and encouraged.   Lipid Panel  Lab Results  Component Value Date   CHOL 163 09/27/2022   HDL 38 (L) 09/27/2022   LDLCALC 83 09/27/2022   TRIG 255 (H) 09/27/2022   CHOLHDL 4.3 09/27/2022  Updated lab needed at/ before next visit.   Nicotine  dependence Asked:confirms currently smokes cigarettes 1 PPD Assess: Unwilling to set a quit date, not  cutting back Advise: needs to QUIT to reduce risk of cancer, cardio and cerebrovascular disease Assist: counseled for 5 minutes and literature provided Arrange: follow up in 2 to 4 months   Knee pain, right Reports pain ranges from 3 to 10, refer IOrtho  Chronic hip pain, bilateral Reports worsening pain, has benefited in the past from intra articular injections reportedly, refer Ortho

## 2023-07-18 NOTE — Patient Instructions (Addendum)
 Annual exam  September 10 or after, call if you need me sooner  PLEASE schedule mammogram and dexa at checkout  Labs today lipid,  cmp and EGF, tSH, vit D  You are being referred to Dr Margrette for bilateral  and right knee pain  nEED shingrix and TdAP at your pharmacy  PLEASE work on stopping  smoking, current 1 PPD   Thanks for choosing [facility], we consider it a privelige to serve you.

## 2023-07-18 NOTE — Assessment & Plan Note (Signed)
 I'm therapy, improved opveral;l;

## 2023-07-18 NOTE — Assessment & Plan Note (Signed)
 Hyperlipidemia:Low fat diet discussed and encouraged.   Lipid Panel  Lab Results  Component Value Date   CHOL 163 09/27/2022   HDL 38 (L) 09/27/2022   LDLCALC 83 09/27/2022   TRIG 255 (H) 09/27/2022   CHOLHDL 4.3 09/27/2022     Updated lab needed at/ before next visit.

## 2023-07-18 NOTE — Assessment & Plan Note (Signed)
 Reports pain ranges from 3 to 10, refer IOrtho

## 2023-07-18 NOTE — Assessment & Plan Note (Addendum)
 Uncontrolled , resume microzide  12. mg with 20 meq portassium daily DASH diet and commitment to daily physical activity for a minimum of 30 minutes discussed and encouraged, as a part of hypertension management. The importance of attaining a healthy weight is also discussed.     07/18/2023    8:02 AM 07/10/2023    2:55 PM 07/05/2023    4:05 PM 06/12/2023    8:40 PM 06/12/2023    7:00 PM 06/12/2023    4:25 PM 06/12/2023    4:24 PM  BP/Weight  Systolic BP 147 145 -- 126 115  141  Diastolic BP 82 86 -- 89 71  81  Wt. (Lbs) 162.04 161.82 161.4   170   BMI 24.64 kg/m2 24.6 kg/m2 24.54 kg/m2   25.85 kg/m2

## 2023-07-18 NOTE — Assessment & Plan Note (Signed)
 Reports worsening pain, has benefited in the past from intra articular injections reportedly, refer Ortho

## 2023-07-18 NOTE — Assessment & Plan Note (Signed)
 Controlled, no change in medication

## 2023-07-19 LAB — CMP14+EGFR
ALT: 35 IU/L — ABNORMAL HIGH (ref 0–32)
AST: 36 IU/L (ref 0–40)
Albumin: 4.4 g/dL (ref 3.9–4.9)
Alkaline Phosphatase: 158 IU/L — ABNORMAL HIGH (ref 44–121)
BUN/Creatinine Ratio: 9 — ABNORMAL LOW (ref 12–28)
BUN: 9 mg/dL (ref 8–27)
Bilirubin Total: 0.2 mg/dL (ref 0.0–1.2)
CO2: 21 mmol/L (ref 20–29)
Calcium: 9.7 mg/dL (ref 8.7–10.3)
Chloride: 103 mmol/L (ref 96–106)
Creatinine, Ser: 0.98 mg/dL (ref 0.57–1.00)
Globulin, Total: 3.1 g/dL (ref 1.5–4.5)
Glucose: 89 mg/dL (ref 70–99)
Potassium: 3.8 mmol/L (ref 3.5–5.2)
Sodium: 142 mmol/L (ref 134–144)
Total Protein: 7.5 g/dL (ref 6.0–8.5)
eGFR: 63 mL/min/{1.73_m2} (ref >59)

## 2023-07-19 LAB — LIPID PANEL
Chol/HDL Ratio: 3.6 ratio (ref 0.0–4.4)
Cholesterol, Total: 121 mg/dL (ref 100–199)
HDL: 34 mg/dL — ABNORMAL LOW (ref >39)
LDL Chol Calc (NIH): 51 mg/dL (ref 0–99)
Triglycerides: 221 mg/dL — ABNORMAL HIGH (ref 0–149)
VLDL Cholesterol Cal: 36 mg/dL (ref 5–40)

## 2023-07-19 LAB — VITAMIN D 25 HYDROXY (VIT D DEFICIENCY, FRACTURES): Vit D, 25-Hydroxy: 44.2 ng/mL (ref 30.0–100.0)

## 2023-07-19 LAB — TSH: TSH: 2.37 u[IU]/mL (ref 0.450–4.500)

## 2023-07-27 ENCOUNTER — Telehealth: Admitting: Licensed Clinical Social Worker

## 2023-07-29 ENCOUNTER — Ambulatory Visit: Payer: Self-pay | Admitting: Family Medicine

## 2023-08-01 ENCOUNTER — Encounter: Payer: Self-pay | Admitting: Licensed Clinical Social Worker

## 2023-08-01 ENCOUNTER — Other Ambulatory Visit: Payer: Self-pay | Admitting: Licensed Clinical Social Worker

## 2023-08-01 NOTE — Patient Outreach (Signed)
 Complex Care Management   Visit Note  08/01/2023  Name:  Kelly Douglas MRN: [id number] DOB: July 20, 1955  Situation: Referral received for Complex Care Management related to SDOH Barriers:  Depression Financial Resource Strain and Stress. I obtained verbal consent from Patient.  Visit completed with [facility] LCSW  on the phone  Background:   Past Medical History:  Diagnosis Date   Anxiety    Arteriosclerotic cardiovascular disease (ASCVD) 2013   coronary calcification-left main, LAD and CX; small pericardial effusion   Arthritis    hips   Cancer (HCC)    Lung Cancer    Chronic back pain    Chronic bronchitis    Chronic lung disease    Chronic scarring and volume loss-left lung; characteristics of a chronic infectious process-possible MAI   Depression    Dyspnea    occasional    GERD (gastroesophageal reflux disease)    Dr Shaaron EGD 09/2009->esophagitis, sm HH, antral erosions, atonic esophagus   History of radiation therapy    Left and Right Lung- 11/24/21-12/07/21- Dr. Lynwood Nasuti   Hyperlipidemia    Hypertension    Hypothyroid 1981 approx   Scleroderma (HCC)    Seasonal allergies    Syncope    Multiple spells over the past 40+ years, likely neurocardiogenic   Tobacco abuse 06/24/2009   Patient Reported Symptoms:  Cognitive Cognitive Status: Able to follow simple commands, Alert and oriented to person, place, and time, Normal speech and language skills Cognitive/Intellectual Conditions Management [RPT]: None reported or documented in medical history or problem list   Health Maintenance Behaviors: Annual physical exam Healing Pattern: Average Health Facilitated by: Rest, Pain control  Neurological Neurological Review of Symptoms: No symptoms reported    HEENT HEENT Symptoms Reported: No symptoms reported      Cardiovascular Does patient have uncontrolled Hypertension?: Yes Patient's Recent BP reading at home: Blood pressure cuff is still at her  sister's. Cardiovascular Management Strategies: Medication therapy, Routine screening Cardiovascular Self-Management Outcome: 4 (good)  Respiratory Respiratory Symptoms Reported: No symptoms reported    Endocrine Endocrine Symptoms Reported: No symptoms reported Is patient diabetic?: No    Gastrointestinal Gastrointestinal Symptoms Reported: Reflux/heartburn Gastrointestinal Management Strategies: Coping strategies Gastrointestinal Self-Management Outcome: 4 (good)    Genitourinary Genitourinary Symptoms Reported: No symptoms reported    Integumentary Integumentary Symptoms Reported: No symptoms reported    Musculoskeletal Musculoskelatal Symptoms Reviewed: Other Other Musculoskeletal Symptoms: Flank and feet pain, referral to [facility] made Musculoskeletal Management Strategies: Coping strategies, Medication therapy, Routine screening Musculoskeletal Self-Management Outcome: 2 (bad)      Psychosocial Psychosocial Symptoms Reported: Alteration in sleep habits Behavioral Management Strategies: Community resources, Medication therapy, Coping strategies Behavioral Health Self-Management Outcome: 3 (uncertain) Behavioral Health Comment: Pt has reported grief over the loss of her brothers and sister over the past few years but denies needing a behavioral health referral at this time for therapy. She is considering psychiatry now due to increased sleep problems. She was provided coping skill education for sterss management and healthy living          08/01/2023    2:13 PM  Depression screen PHQ 2/9  Decreased Interest 0  Down, Depressed, Hopeless 0  PHQ - 2 Score 0  Altered sleeping 3  Tired, decreased energy 2  Change in appetite 0  Feeling bad or failure about yourself  0  Trouble concentrating 0  Moving slowly or fidgety/restless 0  Suicidal thoughts 0  PHQ-9 Score 5  Difficult doing work/chores Somewhat  difficult      07/18/2023    8:04 AM 07/12/2023   10:02 AM  05/22/2023    3:56 PM 11/22/2022    4:03 PM  GAD 7 : Generalized Anxiety Score  Nervous, Anxious, on Edge 1 0 0 0  Control/stop worrying 0 0 0 0  Worry too much - different things 0 0 0 0  Trouble relaxing 0 1 0 0  Restless 0 0 0 0  Easily annoyed or irritable 0 0 0 1  Afraid - awful might happen 0 0 0 0  Total GAD 7 Score 1 1 0 1  Anxiety Difficulty Not difficult at all Not difficult at all Not difficult at all Not difficult at all    SDOH Screenings   Food Insecurity: Food Insecurity Present (08/01/2023)  Housing: Low Risk  (08/01/2023)  Recent Concern: Housing - High Risk (07/05/2023)  Transportation Needs: No Transportation Needs (08/01/2023)  Utilities: Not At Risk (07/12/2023)  Alcohol Screen: Low Risk  (07/05/2023)  Depression (PHQ2-9): Medium Risk (08/01/2023)  Financial Resource Strain: High Risk (07/12/2023)  Physical Activity: Inactive (07/05/2023)  Social Connections: Moderately Integrated (07/12/2023)  Stress: No Stress Concern Present (08/01/2023)  Tobacco Use: High Risk (08/01/2023)  Health Literacy: Adequate Health Literacy (07/05/2023)   There were no vitals filed for this visit.  Medications Reviewed Today     Reviewed by Merlynn Lyle CROME, LCSW (Social Worker) on 08/01/23 at 1404  Med List Status: <None>   Medication Order Taking? Sig Documenting Provider Last Dose Status Informant  acetaminophen  (TYLENOL ) 650 MG CR tablet [id number]  Take 1,300 mg by mouth every 8 (eight) hours as needed for pain.  [provider]  Active Self           Med Note DORNA, MARIAN A   Wed Jul 05, 2023  4:13 PM)    albuterol  (VENTOLIN  HFA) 108 (90 Base) MCG/ACT inhaler [id number]  Inhale 1-2 puffs into the lungs every 6 (six) hours as needed for wheezing or shortness of breath. Antonetta Rollene BRAVO, MD  Active   amLODipine  (NORVASC ) 5 MG tablet [id number]  TAKE 1 TABLET EVERY DAY Antonetta Rollene BRAVO, MD  Active Self  atorvastatin  (LIPITOR) 10 MG tablet [id number]  TAKE 1 TABLET EVERY DAY  Antonetta Rollene BRAVO, MD  Active Self  DULoxetine  (CYMBALTA ) 60 MG capsule [id number]  TAKE 1 CAPSULE EVERY DAY Antonetta Rollene BRAVO, MD  Active Self  famotidine  (PEPCID ) 20 MG tablet [id number]  TAKE 1 TABLET EVERY DAY Antonetta Rollene BRAVO, MD  Active Self           Med Note DORNA, MARIAN A   Wed Jul 05, 2023  4:06 PM)    ferrous sulfate  325 (65 FE) MG EC tablet [id number]  Take 325 mg by mouth daily with breakfast. [provider]  Active Self  hydrochlorothiazide  (HYDRODIURIL ) 12.5 MG tablet [id number]  Tak one tablet by mouth once daily for blood pressure Antonetta Rollene BRAVO, MD  Active   HYDROcodone -acetaminophen  (NORCO/VICODIN) 5-325 MG tablet [id number]  Take 1 tablet by mouth every 6 (six) hours as needed for moderate pain (pain score 4-6).  Patient not taking: Reported on 07/18/2023   Nivia Colon, PA-C  Active   levothyroxine  (SYNTHROID ) 75 MCG tablet [id number]  TAKE 1 TABLET EVERY OTHER DAY ALTERNATING WITH 1/2 TABLET EVERY OTHER DAY Antonetta Rollene BRAVO, MD  Active   meloxicam  (MOBIC ) 7.5 MG tablet [id number]  TAKE 1 TABLET EVERY DAY Antonetta Rollene BRAVO, MD  Active Self  mirtazapine  (  REMERON ) 7.5 MG tablet [id number]  TAKE 1 TABLET AT BEDTIME Patel, Rutwik K, MD  Active Self  montelukast  (SINGULAIR ) 10 MG tablet [id number]  TAKE 1 TABLET AT BEDTIME Antonetta Rollene BRAVO, MD  Active Self  Multiple Vitamin (MULTIVITAMIN WITH MINERALS) TABS tablet [id number]  Take 1 tablet by mouth daily. [provider]  Active Self  pantoprazole  (PROTONIX ) 40 MG tablet [id number]  TAKE 1 TABLET TWICE DAILY Antonetta Rollene BRAVO, MD  Active Self  potassium chloride  (KLOR-CON  M) 10 MEQ tablet [id number]  Take 1 tablet (10 mEq total) by mouth 2 (two) times daily. Antonetta Rollene BRAVO, MD  Active   Med List Note (Ward, Angelica, CPhT 06/12/23 1919): Send long term meds to [facility] (90 day supply) and then send short term meds to [facility]             Recommendation:   PCP Follow-up Continue Current  Plan of Care  Follow Up Plan:   Telephone follow-up in 1 month  Lyle Rung, BSW, MSW, LCSW Licensed Clinical Social Worker [employer]   [facility] [email or web address] Direct Dial: [phone]

## 2023-08-01 NOTE — Patient Instructions (Signed)
 Visit Information  Thank you for taking time to visit with me today. Please don't hesitate to contact me if I can be of assistance to you before our next scheduled appointment.  Your next care management appointment is by telephone on 08/21/23 at 11am  Please call the care guide team at [phone] if you need to cancel, schedule, or reschedule an appointment.   Please call the Suicide and Crisis Lifeline: 988 call the [place]  National Suicide Prevention Lifeline: 217-074-5401 or TTY: 484-618-9621 TTY 585-032-0343) to talk to a trained counselor call the [facility]: [phone] if you are experiencing a Mental Health or Behavioral Health Crisis or need someone to talk to.  Lyle Rung, BSW, MSW, LCSW Licensed Clinical Social Worker [employer]   [facility] [email or web address] Direct Dial: [phone]

## 2023-08-07 ENCOUNTER — Ambulatory Visit: Payer: Self-pay | Admitting: Family Medicine

## 2023-08-07 ENCOUNTER — Ambulatory Visit (HOSPITAL_COMMUNITY)
Admission: RE | Admit: 2023-08-07 | Discharge: 2023-08-07 | Disposition: A | Discharge status: Home / Self Care | Source: Ambulatory Visit | Attending: Family Medicine | Admitting: Family Medicine

## 2023-08-07 ENCOUNTER — Encounter (HOSPITAL_COMMUNITY): Payer: Self-pay

## 2023-08-07 DIAGNOSIS — Z780 Asymptomatic menopausal state: Secondary | ICD-10-CM | POA: Diagnosis not present

## 2023-08-07 DIAGNOSIS — Z1231 Encounter for screening mammogram for malignant neoplasm of breast: Secondary | ICD-10-CM | POA: Insufficient documentation

## 2023-08-07 DIAGNOSIS — M810 Age-related osteoporosis without current pathological fracture: Secondary | ICD-10-CM | POA: Diagnosis not present

## 2023-08-07 MED ORDER — DENOSUMAB 60 MG/ML ~~LOC~~ SOSY: 60.0000 mg | PREFILLED_SYRINGE | Freq: Once | SUBCUTANEOUS | 0 refills | Status: AC

## 2023-08-14 NOTE — Progress Notes (Signed)
 Erroneous encounter

## 2023-08-21 ENCOUNTER — Ambulatory Visit: Admitting: Orthopedic Surgery

## 2023-08-21 ENCOUNTER — Telehealth: Payer: Self-pay | Admitting: Licensed Clinical Social Worker

## 2023-08-21 ENCOUNTER — Encounter: Payer: Self-pay | Admitting: Licensed Clinical Social Worker

## 2023-08-21 ENCOUNTER — Telehealth: Payer: Self-pay | Admitting: Family Medicine

## 2023-08-21 ENCOUNTER — Ambulatory Visit: Payer: Self-pay

## 2023-08-21 NOTE — Telephone Encounter (Signed)
 Pt is needing a referral to vascular for her foot pain, per Dr. Margrette.

## 2023-08-21 NOTE — Telephone Encounter (Signed)
 Ortho care sent a teams message patient appointment was canceled per provider needs to referred to a VVS.

## 2023-08-21 NOTE — Telephone Encounter (Signed)
 Please send Message to Provider and request Referral be placed.  I do not have security to place Referrals.

## 2023-08-21 NOTE — Telephone Encounter (Signed)
 FYI Only or Action Required?: Action required by provider: referral request.  Patient was last seen in primary care on 07/18/2023 by Kelly Rollene BRAVO, MD.  Called Nurse Triage reporting Foot Pain.  Symptoms began several years ago.  Interventions attempted: OTC medications: tylenol .  Symptoms are: gradually worsening.  Triage Disposition: See PCP Within 2 Weeks  Patient/caregiver understands and will follow disposition?: Yes     Copied from CRM [id number]. Topic: Clinical - Red Word Triage >> Aug 21, 2023  4:19 PM Kelly Douglas wrote: Red Word that prompted transfer to Nurse Triage: Pt is having severe pain in right foot. Pt needs a referral for a vascular and pain doctor Reason for Disposition  Foot pain is a chronic symptom (recurrent or ongoing AND present > 4 weeks)  Answer Assessment - Initial Assessment Questions 1. ONSET: When did the pain start?      Almost a year 2. LOCATION: Where is the pain located?      Right foot 3. PAIN: How bad is the pain?    (Scale 1-10; or mild, moderate, severe)     5/10 4. WORK OR EXERCISE: Has there been any recent work or exercise that involved this part of the body?      no 5. CAUSE: What do you think is causing the foot pain?     Unknown 6. OTHER SYMPTOMS: Do you have any other symptoms? (e.g., leg pain, rash, fever, numbness)     no  Protocols used: Foot Pain-A-AH

## 2023-08-31 ENCOUNTER — Ambulatory Visit (INDEPENDENT_AMBULATORY_CARE_PROVIDER_SITE_OTHER): Payer: Self-pay | Admitting: Family Medicine

## 2023-08-31 ENCOUNTER — Encounter: Payer: Self-pay | Admitting: Family Medicine

## 2023-08-31 VITALS — BP 119/77 | HR 76 | Resp 16 | Ht 68.0 in | Wt 156.1 lb

## 2023-08-31 DIAGNOSIS — E038 Other specified hypothyroidism: Secondary | ICD-10-CM | POA: Diagnosis not present

## 2023-08-31 DIAGNOSIS — M79604 Pain in right leg: Secondary | ICD-10-CM

## 2023-08-31 DIAGNOSIS — I10 Essential (primary) hypertension: Secondary | ICD-10-CM | POA: Diagnosis not present

## 2023-08-31 DIAGNOSIS — M79605 Pain in left leg: Secondary | ICD-10-CM

## 2023-08-31 DIAGNOSIS — R630 Anorexia: Secondary | ICD-10-CM | POA: Diagnosis not present

## 2023-08-31 DIAGNOSIS — F17208 Nicotine dependence, unspecified, with other nicotine-induced disorders: Secondary | ICD-10-CM

## 2023-08-31 DIAGNOSIS — R195 Other fecal abnormalities: Secondary | ICD-10-CM | POA: Diagnosis not present

## 2023-08-31 NOTE — Progress Notes (Signed)
 Kelly Douglas     MRN: [id number]      DOB: [birth year]  Chief Complaint  Patient presents with   Foot Pain    Pt complains of swelling and pain in both feet   poor appetite    Pt complains of poor appetitie for months    HPI Kelly Douglas is here with a 2 month h/o pain and swelling in both feet, right worse than left , no response to fluid pills, has also noted skin to be getting darker on right foot Over 1 year h/o change in stool caliber, balls then water  with reduced bM to once week, and also noticed poor appetite  with weight loss in  x 2 monhts ROS Denies recent fever or chills. Denies sinus pressure, nasal congestion, ear pain or sore throat. Denies chest congestion, productive cough or wheezing. Denies chest pains, palpitations and leg swelling Denies abdominal pain, nausea, vomiting,diarrhea or constipation.   Denies dysuria, frequency, hesitancy or incontinence. . Denies headaches, seizures, numbness, or tingling. Denies uncontrolled  depression, anxiety or insomnia. Denies skin break down or rash.   PE  BP 119/77   Pulse 76   Resp 16   Ht 5' 8 (1.727 m)   Wt 156 lb 1.9 oz (70.8 kg)   LMP  (LMP Unknown)   SpO2 97%   BMI 23.74 kg/m   Patient alert and oriented and in no cardiopulmonary distress.  HEENT: No facial asymmetry, EOMI,     Neck supple .  Chest: Clear to auscultation bilaterally.  CVS: S1, S2 no murmurs, no S3.Regular rate.  ABD: Soft no localized tenderness, guarding or rebound, no palpable organomegaly or mass  Ext: No edema  MS: Adequate though reduced ROM spine, shoulders, hips and knees.  Skin: Intact, no ulcerations or rash noted.  Psych: Good eye contact, normal affect. Memory intact not anxious or depressed appearing.  CNS: CN 2-12 intact, power,  normal throughout.no focal deficits noted.   Assessment & Plan  Pain in both lower extremities Chronic com,plaint, when referred to Ortho , on review Ortho has rtecommended  Vascular eval so referral is being sent there, pt's pedal pulses are normal and she denies claudication  Poor appetite 12 pound weight loss in 1 year, reports early satiety and poor appetite refer GI  Change in stool caliber 1 yeqar history, balls then liquid stol also weight loss refer to GI, ongoing nicotine  use  Hypothyroidism Controlled, no change in medication   Nicotine  dependence Asked:confirms currently smokes cigarettes Assess: Unwilling to set a quit date, but is cutting back Advise: needs to QUIT to reduce risk of cancer, cardio and cerebrovascular disease Assist: counseled for 5 minutes and literature provided Arrange: follow up in 2 to 4 months   HTN (hypertension) Controlled, no change in medication DASH diet and commitment to daily physical activity for a minimum of 30 minutes discussed and encouraged, as a part of hypertension management. The importance of attaining a healthy weight is also discussed.     09/05/2023    1:30 PM 08/31/2023    2:39 PM 07/18/2023    8:02 AM 07/10/2023    2:55 PM 07/05/2023    4:05 PM 06/12/2023    8:40 PM 06/12/2023    7:00 PM  BP/Weight  Systolic BP 127 119 147 145 -- 126 115  Diastolic BP 83 77 82 86 -- 89 71  Wt. (Lbs) 156 156.12 162.04 161.82 161.4    BMI 23.72 kg/m2 23.74 kg/m2 24.64 kg/m2  24.6 kg/m2 24.54 kg/m2

## 2023-08-31 NOTE — Patient Instructions (Signed)
 F/u as before .   You are referred to gI and to vascular surgery .  You  should get messages for appointments in the next 1 week  Please try to quit smoking again  Thanks for choosing [facility], we consider it a privelige to serve you.

## 2023-09-04 NOTE — Progress Notes (Unsigned)
 [facility] OF [place]  ASSESSMENT / PLAN: 68 y.o. female with paresthesias, instability, and lower extremity swelling. No evidence of significant venous or arterial insufficiency on vascular physical exam or non-invasive testing today. Patient can follow up with me as needed.  CHIEF COMPLAINT: paresthesias  HISTORY OF PRESENT ILLNESS: Kelly Douglas is a 68 y.o. female referred to clinic for evaluation of bilateral lower extremity paresthesias and swelling. The patient reports pins and needles sensation in the plantar aspect of both feet. She reports swelling around the foot and ankle that waxes and wanes. She was concerned this may be peripheral arterial disease. The patient reports no cramping discomfort typical of intermittent claudication. She denies ischemic rest pain. She has no ulcers on her feet. She reports a history of lower back pain. She reports her discomfort has a radiating quality down her legs.   Past Medical History:  Diagnosis Date   Anxiety    Arteriosclerotic cardiovascular disease (ASCVD) 2013   coronary calcification-left main, LAD and CX; small pericardial effusion   Arthritis    hips   Cancer (HCC)    Lung Cancer    Chronic back pain    Chronic bronchitis    Chronic lung disease    Chronic scarring and volume loss-left lung; characteristics of a chronic infectious process-possible MAI   Depression    Dyspnea    occasional    GERD (gastroesophageal reflux disease)    Dr Shaaron EGD 09/2009->esophagitis, sm HH, antral erosions, atonic esophagus   History of radiation therapy    Left and Right Lung- 11/24/21-12/07/21- Dr. Lynwood Nasuti   Hyperlipidemia    Hypertension    Hypothyroid 1981 approx   Scleroderma (HCC)    Seasonal allergies    Syncope    Multiple spells over the past 40+ years, likely neurocardiogenic   Tobacco abuse 06/24/2009    Past Surgical History:  Procedure Laterality Date   BILATERAL SALPINGOOPHORECTOMY  2013     Dr. Jayne; uterus remains in situ   BRONCHIAL BIOPSY  11/21/2019   Procedure: BRONCHIAL BIOPSIES;  Surgeon: Shelah Lamar RAMAN, MD;  Location: [facility] ENDOSCOPY;  Service: Pulmonary;;   BRONCHIAL BRUSHINGS  11/21/2019   Procedure: BRONCHIAL BRUSHINGS;  Surgeon: Shelah Lamar RAMAN, MD;  Location: [facility] ENDOSCOPY;  Service: Pulmonary;;   BRONCHIAL NEEDLE ASPIRATION BIOPSY  11/21/2019   Procedure: BRONCHIAL NEEDLE ASPIRATION BIOPSIES;  Surgeon: Shelah Lamar RAMAN, MD;  Location: [facility] ENDOSCOPY;  Service: Pulmonary;;   BRONCHIAL WASHINGS  11/21/2019   Procedure: BRONCHIAL WASHINGS;  Surgeon: Shelah Lamar RAMAN, MD;  Location: [facility] ENDOSCOPY;  Service: Pulmonary;;   COLONOSCOPY  09/28/09   anal papilla otherwise normal   COLONOSCOPY N/A 08/29/2012   MFM:Rnonwpr polyp-removed as described above. tubular adenoma   COLONOSCOPY WITH PROPOFOL  N/A 11/11/2019   Procedure: COLONOSCOPY WITH PROPOFOL ;  Surgeon: Shaaron Lamar HERO, MD;  Location: [facility] ENDO SUITE;  Service: Endoscopy;  Laterality: N/A;  2:30pm   ESOPHAGOGASTRODUODENOSCOPY  10/07/09   Dr. Verneice of esophageal mucosa, diffusely ?esophagitis (bx benign), small HH/antral erosions, erythema bx benign. 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 Hiatal hernia   FUDUCIAL PLACEMENT  10/18/2017   Procedure: PLACEMENT OF 3 FUDUCIAL INTO LEFT LOWER LOBE OF LUNG-TARGET 1;  Surgeon: Shelah Lamar RAMAN, MD;  Location: [facility] OR;  Service: Thoracic;;   LAPAROSCOPIC APPENDECTOMY  07/16/2011   Procedure: APPENDECTOMY LAPAROSCOPIC;  Surgeon: Brent C Ziegler,  MD;  Location: [facility] ORS;  Service: General;  Laterality: N/A;   POLYPECTOMY  11/11/2019   Procedure: POLYPECTOMY;  Surgeon: Shaaron Lamar HERO, MD;  Location: [facility] ENDO SUITE;  Service: Endoscopy;;   TUBAL LIGATION     VIDEO BRONCHOSCOPY  10/18/2017   VIDEO BRONCHOSCOPY  WITH ENDOBRONCHIAL NAVIGATION N/A 10/18/2017   Procedure: VIDEO BRONCHOSCOPY WITH ENDOBRONCHIAL NAVIGATION;  Surgeon: Shelah Lamar RAMAN, MD;  Location: [facility] OR;  Service: Thoracic;  Laterality: N/A;   VIDEO BRONCHOSCOPY WITH ENDOBRONCHIAL NAVIGATION Left 11/21/2019   Procedure: VIDEO BRONCHOSCOPY WITH ENDOBRONCHIAL NAVIGATION;  Surgeon: Shelah Lamar RAMAN, MD;  Location: [facility] ENDOSCOPY;  Service: Pulmonary;  Laterality: Left;    Family History  Problem Relation Age of Onset   Aneurysm Mother    Stroke Father    Aneurysm Father    Hypertension Sister    Cancer Brother        throat cancer   Coronary artery disease Brother    Hypertension Brother    Down syndrome Brother    Hypertension Brother    Hypertension Brother        father/mother   Throat cancer Brother 49       throat cancer   Diabetes Brother    Diabetes Brother    Anesthesia problems Neg Hx    Hypotension Neg Hx    Malignant hyperthermia Neg Hx    Pseudochol deficiency Neg Hx    Colon cancer Neg Hx     Social History   Socioeconomic History   Marital status: Married    Spouse name: Not on file   Number of children: 2   Years of education: Not on file   Highest education level: Not on file  Occupational History   Occupation: disabled    Employer: NOT EMPLOYED  Tobacco Use   Smoking status: Every Day    Current packs/day: 0.50    Average packs/day: 0.5 packs/day for 52.1 years (26.1 ttl pk-yrs)    Types: Cigarettes    Start date: 07/14/1971   Smokeless tobacco: Never   Tobacco comments:    0.5 packs of cigarettes smoked daily [facility] 12/31/19  Vaping Use   Vaping status: Never Used  Substance and Sexual Activity   Alcohol use: Not Currently    Alcohol/week: 0.0 standard drinks of alcohol    Comment: quit June 2012-used to drink 6 pack daily, occasional beer per pt 11/20/19   Drug use: No   Sexual activity: Yes    Birth control/protection: Post-menopausal  Other Topics Concern   Not on file  Social History  Narrative   Not on file   Social Drivers of Health   Financial Resource Strain: High Risk (07/12/2023)   Overall Financial Resource Strain (CARDIA)    Difficulty of Paying Living Expenses: Hard  Food Insecurity: Food Insecurity Present (08/01/2023)   Hunger Vital Sign    Worried About Running Out of Food in the Last Year: Sometimes true    Ran Out of Food in the Last Year: Never true  Transportation Needs: No Transportation Needs (08/01/2023)   PRAPARE - Administrator, Civil Service (Medical): No    Lack of Transportation (Non-Medical): No  Physical Activity: Inactive (07/05/2023)   Exercise Vital Sign    Days of Exercise per Week: 0 days    Minutes of Exercise per Session: 0 min  Stress: No Stress Concern Present (08/01/2023)   [employer] of Occupational Health - Occupational Stress Questionnaire    Feeling of Stress: Only  a little  Social Connections: Moderately Integrated (07/12/2023)   Social Connection and Isolation Panel    Frequency of Communication with Friends and Family: More than three times a week    Frequency of Social Gatherings with Friends and Family: Once a week    Attends Religious Services: More than 4 times per year    Active Member of [employer] or Organizations: No    Attends Banker Meetings: Never    Marital Status: Married  Catering manager Violence: Not At Risk (07/12/2023)   Humiliation, Afraid, Rape, and Kick questionnaire    Fear of Current or Ex-Partner: No    Emotionally Abused: No    Physically Abused: No    Sexually Abused: No    Allergies  Allergen Reactions   Aspirin  Other (See Comments)    Nose bleeds    Current Outpatient Medications  Medication Sig Dispense Refill   acetaminophen  (TYLENOL ) 650 MG CR tablet Take 1,300 mg by mouth every 8 (eight) hours as needed for pain.      albuterol  (VENTOLIN  HFA) 108 (90 Base) MCG/ACT inhaler Inhale 1-2 puffs into the lungs every 6 (six) hours as needed for wheezing or  shortness of breath. 54 g 3   amLODipine  (NORVASC ) 5 MG tablet TAKE 1 TABLET EVERY DAY 90 tablet 3   atorvastatin  (LIPITOR) 10 MG tablet TAKE 1 TABLET EVERY DAY 90 tablet 3   DULoxetine  (CYMBALTA ) 60 MG capsule TAKE 1 CAPSULE EVERY DAY 90 capsule 3   famotidine  (PEPCID ) 20 MG tablet TAKE 1 TABLET EVERY DAY 90 tablet 3   ferrous sulfate  325 (65 FE) MG EC tablet Take 325 mg by mouth daily with breakfast.     hydrochlorothiazide  (HYDRODIURIL ) 12.5 MG tablet Tak one tablet by mouth once daily for blood pressure 90 tablet 2   levothyroxine  (SYNTHROID ) 75 MCG tablet TAKE 1 TABLET EVERY OTHER DAY ALTERNATING WITH 1/2 TABLET EVERY OTHER DAY 66 tablet 3   meloxicam  (MOBIC ) 7.5 MG tablet TAKE 1 TABLET EVERY DAY 90 tablet 3   mirtazapine  (REMERON ) 7.5 MG tablet TAKE 1 TABLET AT BEDTIME 90 tablet 3   montelukast  (SINGULAIR ) 10 MG tablet TAKE 1 TABLET AT BEDTIME 90 tablet 3   Multiple Vitamin (MULTIVITAMIN WITH MINERALS) TABS tablet Take 1 tablet by mouth daily.     pantoprazole  (PROTONIX ) 40 MG tablet TAKE 1 TABLET TWICE DAILY 180 tablet 3   potassium chloride  (KLOR-CON  M) 10 MEQ tablet Take 1 tablet (10 mEq total) by mouth 2 (two) times daily. 180 tablet 2   HYDROcodone -acetaminophen  (NORCO/VICODIN) 5-325 MG tablet Take 1 tablet by mouth every 6 (six) hours as needed for moderate pain (pain score 4-6). (Patient not taking: Reported on 09/05/2023) 10 tablet 0   No current facility-administered medications for this visit.    PHYSICAL EXAM Vitals:   09/05/23 1330  BP: 127/83  Pulse: 80  Weight: 156 lb (70.8 kg)  Height: 5' 8 (1.727 m)    Elderly woman in no distress Regular rate and rhythm Unlabored breathing 1+ DP pulses    PERTINENT LABORATORY AND RADIOLOGIC DATA  Most recent CBC    Latest Ref Rng & Units 07/03/2023    1:10 PM 02/23/2023    9:35 AM 10/13/2022    3:18 PM  CBC  WBC 4.0 - 10.5 K/uL 11.7  9.6  12.0   Hemoglobin 12.0 - 15.0 g/dL 86.5  86.4  86.8   Hematocrit 36.0 - 46.0 %  41.9  42.7  40.0   Platelets  150 - 400 K/uL 378  327  346      Most recent CMP    Latest Ref Rng & Units 07/18/2023    8:34 AM 07/03/2023    1:10 PM 06/12/2023    4:40 PM  CMP  Glucose 70 - 99 mg/dL 89  894  90   BUN 8 - 27 mg/dL 9  11  9    Creatinine 0.57 - 1.00 mg/dL 9.01  9.03  8.95   Sodium 134 - 144 mmol/L 142  140  134   Potassium 3.5 - 5.2 mmol/L 3.8  3.7  3.8   Chloride 96 - 106 mmol/L 103  104  102   CO2 20 - 29 mmol/L 21  25  23    Calcium  8.7 - 10.3 mg/dL 9.7  9.7  9.6   Total Protein 6.0 - 8.5 g/dL 7.5  7.8    Total Bilirubin 0.0 - 1.2 mg/dL 0.2  0.8    Alkaline Phos 44 - 121 IU/L 158  108    AST 0 - 40 IU/L 36  28    ALT 0 - 32 IU/L 35  25      Renal function CrCl cannot be calculated (Patient's most recent lab result is older than the maximum 21 days allowed.).  Hgb A1c MFr Bld (%)  Date Value  10/27/2015 5.1    LDL Chol Calc (NIH)  Date Value Ref Range Status  07/18/2023 51 0 - 99 mg/dL Final     +-------+-----------+-----------+------------+------------+  ABI/TBIToday's ABIToday's TBIPrevious ABIPrevious TBI  +-------+-----------+-----------+------------+------------+  Right 0.97                                            +-------+-----------+-----------+------------+------------+  Left  0.97                                            +-------+-----------+-----------+------------+------------+     Debby SAILOR. Magda, MD [facility] Phone Number: [phone] 09/05/2023 6:36 PM   Total time spent on preparing this encounter including chart review, data review, collecting history, examining the patient, and coordinating care: 45 min  Portions of this report may have been transcribed using voice recognition software.  Every effort has been made to ensure accuracy; however, inadvertent computerized transcription errors may still be present.

## 2023-09-05 ENCOUNTER — Encounter

## 2023-09-05 ENCOUNTER — Other Ambulatory Visit: Payer: Self-pay | Admitting: Vascular Surgery

## 2023-09-05 ENCOUNTER — Ambulatory Visit: Admitting: Vascular Surgery

## 2023-09-05 ENCOUNTER — Encounter: Payer: Self-pay | Admitting: Vascular Surgery

## 2023-09-05 ENCOUNTER — Encounter: Payer: Self-pay | Admitting: Gastroenterology

## 2023-09-05 VITALS — BP 127/83 | HR 80 | Ht 68.0 in | Wt 156.0 lb

## 2023-09-05 DIAGNOSIS — M79606 Pain in leg, unspecified: Secondary | ICD-10-CM

## 2023-09-05 DIAGNOSIS — R202 Paresthesia of skin: Secondary | ICD-10-CM

## 2023-09-05 LAB — VAS US ABI WITH/WO TBI
Left ABI: 0.97
Right ABI: 0.97

## 2023-09-08 ENCOUNTER — Encounter: Payer: Self-pay | Admitting: Family Medicine

## 2023-09-08 ENCOUNTER — Ambulatory Visit: Payer: Self-pay | Admitting: Family Medicine

## 2023-09-08 DIAGNOSIS — R630 Anorexia: Secondary | ICD-10-CM | POA: Insufficient documentation

## 2023-09-08 DIAGNOSIS — R195 Other fecal abnormalities: Secondary | ICD-10-CM | POA: Insufficient documentation

## 2023-09-08 DIAGNOSIS — M79604 Pain in right leg: Secondary | ICD-10-CM | POA: Insufficient documentation

## 2023-09-08 NOTE — Assessment & Plan Note (Addendum)
 12 pound weight loss in 1 year, reports early satiety and poor appetite refer GI

## 2023-09-08 NOTE — Assessment & Plan Note (Signed)
 1 yeqar history, balls then liquid stol also weight loss refer to GI, ongoing nicotine  use

## 2023-09-08 NOTE — Assessment & Plan Note (Addendum)
 Controlled, no change in medication DASH diet and commitment to daily physical activity for a minimum of 30 minutes discussed and encouraged, as a part of hypertension management. The importance of attaining a healthy weight is also discussed.     09/05/2023    1:30 PM 08/31/2023    2:39 PM 07/18/2023    8:02 AM 07/10/2023    2:55 PM 07/05/2023    4:05 PM 06/12/2023    8:40 PM 06/12/2023    7:00 PM  BP/Weight  Systolic BP 127 119 147 145 -- 126 115  Diastolic BP 83 77 82 86 -- 89 71  Wt. (Lbs) 156 156.12 162.04 161.82 161.4    BMI 23.72 kg/m2 23.74 kg/m2 24.64 kg/m2 24.6 kg/m2 24.54 kg/m2

## 2023-09-08 NOTE — Assessment & Plan Note (Signed)
 Controlled, no change in medication

## 2023-09-08 NOTE — Assessment & Plan Note (Signed)
Asked:confirms currently smokes cigarettes °Assess: Unwilling to set a quit date, but is cutting back °Advise: needs to QUIT to reduce risk of cancer, cardio and cerebrovascular disease °Assist: counseled for 5 minutes and literature provided °Arrange: follow up in 2 to 4 months ° °

## 2023-09-08 NOTE — Assessment & Plan Note (Signed)
 Chronic com,plaint, when referred to Ortho , on review Ortho has rtecommended Vascular eval so referral is being sent there, pt's pedal pulses are normal and she denies claudication

## 2023-09-13 ENCOUNTER — Encounter: Payer: Self-pay | Admitting: Family Medicine

## 2023-09-21 ENCOUNTER — Other Ambulatory Visit: Payer: Self-pay | Admitting: Family Medicine

## 2023-09-21 ENCOUNTER — Other Ambulatory Visit: Payer: Self-pay | Admitting: Internal Medicine

## 2023-09-21 DIAGNOSIS — F32A Depression, unspecified: Secondary | ICD-10-CM

## 2023-10-06 ENCOUNTER — Other Ambulatory Visit: Payer: Self-pay | Admitting: Licensed Clinical Social Worker

## 2023-10-06 NOTE — Patient Outreach (Signed)
 Complex Care Management   Visit Note  10/06/2023  Name:  Kelly Douglas MRN: [id number] DOB: Sep 17, 1955  Situation: Referral received for Complex Care Management related to Mental/Behavioral Health diagnosis MDD and anxiety. I obtained verbal consent from Patient.  Visit completed with Patient  on the phone  Background:   Past Medical History:  Diagnosis Date   Anxiety    Arteriosclerotic cardiovascular disease (ASCVD) 2013   coronary calcification-left main, LAD and CX; small pericardial effusion   Arthritis    hips   Cancer (HCC)    Lung Cancer    Chronic back pain    Chronic bronchitis    Chronic lung disease    Chronic scarring and volume loss-left lung; characteristics of a chronic infectious process-possible MAI   Depression    Dyspnea    occasional    GERD (gastroesophageal reflux disease)    Dr Shaaron EGD 09/2009->esophagitis, sm HH, antral erosions, atonic esophagus   History of radiation therapy    Left and Right Lung- 11/24/21-12/07/21- Dr. Lynwood Nasuti   Hyperlipidemia    Hypertension    Hypothyroid 1981 approx   Scleroderma (HCC)    Seasonal allergies    Syncope    Multiple spells over the past 40+ years, likely neurocardiogenic   Tobacco abuse 06/24/2009    Assessment: Patient Reported Symptoms:  Cognitive Cognitive Status: Able to follow simple commands, Alert and oriented to person, place, and time Cognitive/Intellectual Conditions Management [RPT]: None reported or documented in medical history or problem list   Health Maintenance Behaviors: Annual physical exam, Immunizations Healing Pattern: Average Health Facilitated by: Rest, Pain control  Neurological Neurological Review of Symptoms: No symptoms reported Neurological Management Strategies: Routine screening Neurological Self-Management Outcome: 4 (good)  Psychosocial Psychosocial Symptoms Reported: Alteration in sleep habits Behavioral Management Strategies: Complementary therapy(ies),  Medication therapy Behavioral Health Self-Management Outcome: 4 (good) Behavioral Health Comment: Pt has reported grief over the loss of her brothers and sister over the past few years but denies needing a behavioral health referral at this time for therapy or psychiatry even though this was strongly encouraged. She was provided coping skill education for sterss management and healthy living Major Change/Loss/Stressor/Fears (CP): Death of a loved one Techniques to Cope with Loss/Stress/Change: Spiritual practice(s), Diversional activities Quality of Family Relationships: helpful, involved Do you feel physically threatened by others?: No    10/06/2023    PHQ2-9 Depression Screening   Little interest or pleasure in doing things Not at all  Feeling down, depressed, or hopeless Not at all  PHQ-2 - Total Score 0  Trouble falling or staying asleep, or sleeping too much Nearly every day  Feeling tired or having little energy More than half the days  Poor appetite or overeating  Not at all  Feeling bad about yourself - or that you are a failure or have let yourself or your family down Not at all  Trouble concentrating on things, such as reading the newspaper or watching television Not at all  Moving or speaking so slowly that other people could have noticed.  Or the opposite - being so fidgety or restless that you have been moving around a lot more than usual Not at all  Thoughts that you would be better off dead, or hurting yourself in some way Not at all  PHQ2-9 Total Score 5  If you checked off any problems, how difficult have these problems made it for you to do your work, take care of things at home, or get  along with other people Somewhat difficult (Pt denies BH referral)  Depression Interventions/Treatment      There were no vitals filed for this visit.  Medications Reviewed Today     Reviewed by Merlynn Lyle CROME, LCSW (Social Worker) on 10/06/23 at 1127  Med List Status: <None>    Medication Order Taking? Sig Documenting Provider Last Dose Status Informant  acetaminophen  (TYLENOL ) 650 MG CR tablet [id number]  Take 1,300 mg by mouth every 8 (eight) hours as needed for pain.  [provider]  Active Self           Med Note DORNA, MARIAN A   Wed Jul 05, 2023  4:13 PM)    albuterol  (VENTOLIN  HFA) 108 (90 Base) MCG/ACT inhaler [id number]  Inhale 1-2 puffs into the lungs every 6 (six) hours as needed for wheezing or shortness of breath. Antonetta Rollene BRAVO, MD  Active   amLODipine  (NORVASC ) 5 MG tablet [id number]  TAKE 1 TABLET EVERY DAY Antonetta Rollene BRAVO, MD  Active Self  atorvastatin  (LIPITOR) 10 MG tablet [id number]  TAKE 1 TABLET EVERY DAY Antonetta Rollene BRAVO, MD  Active Self  DULoxetine  (CYMBALTA ) 60 MG capsule [id number]  TAKE 1 CAPSULE EVERY DAY Antonetta Rollene BRAVO, MD  Active   famotidine  (PEPCID ) 20 MG tablet [id number]  TAKE 1 TABLET EVERY DAY Antonetta Rollene BRAVO, MD  Active Self           Med Note DORNA, MARIAN A   Wed Jul 05, 2023  4:06 PM)    ferrous sulfate  325 (65 FE) MG EC tablet [id number]  Take 325 mg by mouth daily with breakfast. [provider]  Active Self  hydrochlorothiazide  (HYDRODIURIL ) 12.5 MG tablet [id number]  Tak one tablet by mouth once daily for blood pressure Antonetta Rollene BRAVO, MD  Active   levothyroxine  (SYNTHROID ) 75 MCG tablet [id number]  TAKE 1 TABLET EVERY OTHER DAY ALTERNATING WITH 1/2 TABLET EVERY OTHER DAY Antonetta Rollene BRAVO, MD  Active   meloxicam  (MOBIC ) 7.5 MG tablet [id number]  TAKE 1 TABLET EVERY DAY Antonetta Rollene BRAVO, MD  Active   mirtazapine  (REMERON ) 7.5 MG tablet [id number]  TAKE 1 TABLET AT BEDTIME Tobie Suzzane POUR, MD  Active   montelukast  (SINGULAIR ) 10 MG tablet [id number]  TAKE 1 TABLET AT BEDTIME Antonetta Rollene BRAVO, MD  Active Self  Multiple Vitamin (MULTIVITAMIN WITH MINERALS) TABS tablet [id number]  Take 1 tablet by mouth daily. [provider]  Active Self  pantoprazole  (PROTONIX ) 40 MG  tablet [id number]  TAKE 1 TABLET TWICE DAILY Antonetta Rollene BRAVO, MD  Active   potassium chloride  (KLOR-CON  M) 10 MEQ tablet [id number]  Take 1 tablet (10 mEq total) by mouth 2 (two) times daily. Antonetta Rollene BRAVO, MD  Active   Med List Note (Ward, Angelica, CPhT 06/12/23 1919): Send long term meds to [facility] (90 day supply) and then send short term meds to [facility]             Recommendation:   PCP Follow-up Continue Current Plan of Care  Follow Up Plan:   Telephone follow-up in 1 month  Lyle Merlynn, BSW, MSW, LCSW Licensed Clinical Social Worker [employer] [facility]   [facility] [email or web address] Direct Dial: [phone]

## 2023-10-07 NOTE — Patient Instructions (Signed)
 Visit Information  Thank you for taking time to visit with me today. Please don't hesitate to contact me if I can be of assistance to you before our next scheduled appointment.  Our next appointment is {NEXTVISITTYPE:[id number]} on *** at *** Please call the care guide team at [phone] if you need to cancel or reschedule your appointment.   Following is a copy of your care plan:   Goals Addressed             This Visit's Progress    LCSW-VBCI Social Work Care Plan       Problems:   Financial constraints related to managing health care expenses for self and family, Disease Management support and education needs related to Insomnia/Sleep Difficulties, Anxiety and Caregiver strain, and Lacks knowledge of how to connect   CSW Clinical Goal(s):   Over the next 90 days the Patient will attend all scheduled medical appointments as evidenced by patient report and care team review of appointment completion in electronic MEDICAL RECORD NUMBERby VBCI LCSW. Over the next 90 days, patient will demonstrate improved health management independence as evidenced by increasing self care and explore community resource options for unmet needs related to [employer] , Food Insecurity , Social Connections, Stress, and Transportation.  Interventions:  Mental Health:  Evaluation of current treatment plan related to Caregiver Stress and Insomnia/Sleep Difficulties Active listening / Reflection utilized Behavioral Activation reviewed Caregiver stress acknowledged :patient denied needing caregiver support group ADTS  information Consideration on in-home help encouraged : options discussed Crisis Resource Education / information provided Depression screen reviewed Discussed caregiver resources and support: spouse is already active in [facility] but other caregiver support resources were provided Discussed referral for psychiatry: Pt is agreeable to consider medication management for anxiety/stress and insomnia. No  BH referrals placed at this time Discussed referral options to connect for ongoing therapy: Pt declined need for grief therapy referral Emotional Support Provided Mindfulness or Relaxation training provided Motivational Interviewing employed PHQ2/PHQ9 completed Problem Solving /Task Center strategies reviewed Provided general psycho-education for mental health needs Quality of sleep assessed & Sleep Hygiene techniques promoted Solution-Focued Strategies employed: Suicidal Ideation/Homicidal Ideation assessed: No SI/HI Patient has ongoing foot/flank/hip pain but reports some improvement in her feet swelling. Patient reports that a referral was made to [facility] and this contact information (address and phone number) was provided to patient over the phone for her to physically write down per patient request.  Referral placed for VBCI RNCM on 10/06/23  Patient Goals/Self-Care Activities:  Continue taking your medication as prescribed.   Increase coping skills, healthy habits, self-management skills, and stress reduction Review educational material on food pantries, transportation and caregiver support resources in your area that was sent by mail that you confirmed receiving. Consider psychiatry referral as well for stress management and insomnia symptom relief.  Plan:   The care management team will reach out to the patient again over the next 30 days.        Please call the Suicide and Crisis Lifeline: 988 call the [place]  National Suicide Prevention Lifeline: (540) 305-8257 or TTY: 786-538-3569 TTY 808-346-5512) to talk to a trained counselor call 1-800-273-TALK (toll free, 24 hour hotline) go to [facility] [address], [place] [phone]) call the [facility] Crisis Line: 5598315734 call 911 if you are experiencing a Mental Health or Behavioral Health Crisis or need someone to talk to.  Patient verbalizes understanding of  instructions and care plan provided today and agrees to  view in MyChart. Active MyChart status and patient understanding of how to access instructions and care plan via MyChart confirmed with patient.     Lyle Rung, BSW, MSW, LCSW Licensed Clinical Social Worker [employer] [facility]   [facility] [email or web address] Direct Dial: [phone]

## 2023-10-14 ENCOUNTER — Emergency Department (HOSPITAL_COMMUNITY)

## 2023-10-14 ENCOUNTER — Encounter (HOSPITAL_COMMUNITY): Payer: Self-pay

## 2023-10-14 ENCOUNTER — Observation Stay (HOSPITAL_COMMUNITY)
Admission: EM | Admit: 2023-10-14 | Discharge: 2023-10-15 | Disposition: A | Attending: Emergency Medicine | Admitting: Emergency Medicine

## 2023-10-14 ENCOUNTER — Other Ambulatory Visit: Payer: Self-pay

## 2023-10-14 DIAGNOSIS — K219 Gastro-esophageal reflux disease without esophagitis: Secondary | ICD-10-CM | POA: Diagnosis not present

## 2023-10-14 DIAGNOSIS — E785 Hyperlipidemia, unspecified: Secondary | ICD-10-CM | POA: Diagnosis not present

## 2023-10-14 DIAGNOSIS — E039 Hypothyroidism, unspecified: Secondary | ICD-10-CM | POA: Diagnosis not present

## 2023-10-14 DIAGNOSIS — F17210 Nicotine dependence, cigarettes, uncomplicated: Secondary | ICD-10-CM | POA: Diagnosis not present

## 2023-10-14 DIAGNOSIS — J449 Chronic obstructive pulmonary disease, unspecified: Secondary | ICD-10-CM | POA: Diagnosis not present

## 2023-10-14 DIAGNOSIS — I10 Essential (primary) hypertension: Secondary | ICD-10-CM | POA: Diagnosis not present

## 2023-10-14 DIAGNOSIS — Z743 Need for continuous supervision: Secondary | ICD-10-CM | POA: Diagnosis not present

## 2023-10-14 DIAGNOSIS — R918 Other nonspecific abnormal finding of lung field: Secondary | ICD-10-CM | POA: Diagnosis not present

## 2023-10-14 DIAGNOSIS — R109 Unspecified abdominal pain: Secondary | ICD-10-CM | POA: Diagnosis not present

## 2023-10-14 DIAGNOSIS — Z7982 Long term (current) use of aspirin: Secondary | ICD-10-CM | POA: Diagnosis not present

## 2023-10-14 DIAGNOSIS — Z79899 Other long term (current) drug therapy: Secondary | ICD-10-CM | POA: Insufficient documentation

## 2023-10-14 DIAGNOSIS — R10817 Generalized abdominal tenderness: Secondary | ICD-10-CM | POA: Insufficient documentation

## 2023-10-14 DIAGNOSIS — E876 Hypokalemia: Secondary | ICD-10-CM | POA: Insufficient documentation

## 2023-10-14 DIAGNOSIS — R9389 Abnormal findings on diagnostic imaging of other specified body structures: Secondary | ICD-10-CM | POA: Diagnosis not present

## 2023-10-14 DIAGNOSIS — Z85118 Personal history of other malignant neoplasm of bronchus and lung: Secondary | ICD-10-CM | POA: Insufficient documentation

## 2023-10-14 DIAGNOSIS — E782 Mixed hyperlipidemia: Secondary | ICD-10-CM | POA: Diagnosis not present

## 2023-10-14 DIAGNOSIS — M349 Systemic sclerosis, unspecified: Secondary | ICD-10-CM | POA: Diagnosis not present

## 2023-10-14 DIAGNOSIS — R079 Chest pain, unspecified: Secondary | ICD-10-CM | POA: Diagnosis not present

## 2023-10-14 DIAGNOSIS — F419 Anxiety disorder, unspecified: Secondary | ICD-10-CM | POA: Diagnosis not present

## 2023-10-14 DIAGNOSIS — D509 Iron deficiency anemia, unspecified: Secondary | ICD-10-CM | POA: Diagnosis not present

## 2023-10-14 DIAGNOSIS — E038 Other specified hypothyroidism: Secondary | ICD-10-CM

## 2023-10-14 DIAGNOSIS — F32A Depression, unspecified: Secondary | ICD-10-CM | POA: Diagnosis present

## 2023-10-14 DIAGNOSIS — C3490 Malignant neoplasm of unspecified part of unspecified bronchus or lung: Secondary | ICD-10-CM | POA: Diagnosis not present

## 2023-10-14 DIAGNOSIS — R61 Generalized hyperhidrosis: Secondary | ICD-10-CM | POA: Diagnosis not present

## 2023-10-14 DIAGNOSIS — R531 Weakness: Secondary | ICD-10-CM | POA: Diagnosis not present

## 2023-10-14 LAB — CBC WITH DIFFERENTIAL/PLATELET
Abs Immature Granulocytes: 0.04 K/uL (ref 0.00–0.07)
Basophils Absolute: 0 K/uL (ref 0.0–0.1)
Basophils Relative: 0 %
Eosinophils Absolute: 0 K/uL (ref 0.0–0.5)
Eosinophils Relative: 0 %
HCT: 36.9 % (ref 36.0–46.0)
Hemoglobin: 12.1 g/dL (ref 12.0–15.0)
Immature Granulocytes: 0 %
Lymphocytes Relative: 28 %
Lymphs Abs: 3.1 K/uL (ref 0.7–4.0)
MCH: 26.5 pg (ref 26.0–34.0)
MCHC: 32.8 g/dL (ref 30.0–36.0)
MCV: 80.7 fL (ref 80.0–100.0)
Monocytes Absolute: 0.7 K/uL (ref 0.1–1.0)
Monocytes Relative: 6 %
Neutro Abs: 7.2 K/uL (ref 1.7–7.7)
Neutrophils Relative %: 66 %
Platelets: 294 K/uL (ref 150–400)
RBC: 4.57 MIL/uL (ref 3.87–5.11)
RDW: 14.9 % (ref 11.5–15.5)
WBC: 11.1 K/uL — ABNORMAL HIGH (ref 4.0–10.5)
nRBC: 0 % (ref 0.0–0.2)

## 2023-10-14 LAB — COMPREHENSIVE METABOLIC PANEL WITH GFR
ALT: 23 U/L (ref 0–44)
AST: 24 U/L (ref 15–41)
Albumin: 3.8 g/dL (ref 3.5–5.0)
Alkaline Phosphatase: 65 U/L (ref 38–126)
Anion gap: 17 — ABNORMAL HIGH (ref 5–15)
BUN: 9 mg/dL (ref 8–23)
CO2: 23 mmol/L (ref 22–32)
Calcium: 7.6 mg/dL — ABNORMAL LOW (ref 8.9–10.3)
Chloride: 99 mmol/L (ref 98–111)
Creatinine, Ser: 0.95 mg/dL (ref 0.44–1.00)
GFR, Estimated: >60 mL/min (ref >60)
Glucose, Bld: 142 mg/dL — ABNORMAL HIGH (ref 70–99)
Potassium: 2.8 mmol/L — ABNORMAL LOW (ref 3.5–5.1)
Sodium: 139 mmol/L (ref 135–145)
Total Bilirubin: 1.1 mg/dL (ref 0.0–1.2)
Total Protein: 6.8 g/dL (ref 6.5–8.1)

## 2023-10-14 LAB — LIPASE, BLOOD: Lipase: 24 U/L (ref 11–51)

## 2023-10-14 LAB — MAGNESIUM: Magnesium: <0.5 mg/dL — CL (ref 1.7–2.4)

## 2023-10-14 MED ORDER — ONDANSETRON HCL 4 MG/2ML IJ SOLN: 4.0000 mg | Freq: Four times a day (QID) | INTRAMUSCULAR | Status: DC | PRN

## 2023-10-14 MED ORDER — MONTELUKAST SODIUM 10 MG PO TABS
10.0000 mg | ORAL_TABLET | Freq: Every day | ORAL | Status: DC
  Administered 2023-10-15: 10 mg via ORAL
  Filled 2023-10-14: qty 1

## 2023-10-14 MED ORDER — ALBUTEROL SULFATE HFA 108 (90 BASE) MCG/ACT IN AERS: 1.0000 | INHALATION_SPRAY | Freq: Four times a day (QID) | RESPIRATORY_TRACT | Status: DC | PRN

## 2023-10-14 MED ORDER — IOHEXOL 300 MG/ML  SOLN
100.0000 mL | Freq: Once | INTRAMUSCULAR | Status: AC | PRN
  Administered 2023-10-14: 100 mL via INTRAVENOUS

## 2023-10-14 MED ORDER — ALBUTEROL SULFATE (2.5 MG/3ML) 0.083% IN NEBU: 2.5000 mg | INHALATION_SOLUTION | Freq: Four times a day (QID) | RESPIRATORY_TRACT | Status: DC | PRN

## 2023-10-14 MED ORDER — HYDROMORPHONE HCL 1 MG/ML IJ SOLN
0.5000 mg | Freq: Once | INTRAMUSCULAR | Status: AC
  Administered 2023-10-14: 0.5 mg via INTRAVENOUS
  Filled 2023-10-14: qty 0.5

## 2023-10-14 MED ORDER — SODIUM CHLORIDE 0.9 % IV BOLUS
1000.0000 mL | Freq: Once | INTRAVENOUS | Status: AC
  Administered 2023-10-14: 1000 mL via INTRAVENOUS

## 2023-10-14 MED ORDER — ONDANSETRON HCL 4 MG PO TABS: 4.0000 mg | ORAL_TABLET | Freq: Four times a day (QID) | ORAL | Status: DC | PRN

## 2023-10-14 MED ORDER — ENOXAPARIN SODIUM 40 MG/0.4ML IJ SOSY
40.0000 mg | PREFILLED_SYRINGE | INTRAMUSCULAR | Status: DC
  Administered 2023-10-15: 40 mg via SUBCUTANEOUS
  Filled 2023-10-14: qty 0.4

## 2023-10-14 MED ORDER — ADULT MULTIVITAMIN W/MINERALS CH
1.0000 | ORAL_TABLET | Freq: Every day | ORAL | Status: DC
  Administered 2023-10-15: 1 via ORAL
  Filled 2023-10-14: qty 1

## 2023-10-14 MED ORDER — DULOXETINE HCL 60 MG PO CPEP
60.0000 mg | ORAL_CAPSULE | Freq: Every day | ORAL | Status: DC
  Administered 2023-10-15: 60 mg via ORAL
  Filled 2023-10-14: qty 1

## 2023-10-14 MED ORDER — FERROUS SULFATE 325 (65 FE) MG PO TABS
325.0000 mg | ORAL_TABLET | Freq: Every day | ORAL | Status: DC
  Administered 2023-10-15: 325 mg via ORAL
  Filled 2023-10-14: qty 1

## 2023-10-14 MED ORDER — LEVOTHYROXINE SODIUM 75 MCG PO TABS
75.0000 ug | ORAL_TABLET | ORAL | Status: DC
Start: 2023-10-15 — End: 2023-10-15
  Administered 2023-10-15: 37.5 ug via ORAL
  Filled 2023-10-14: qty 1

## 2023-10-14 MED ORDER — ACETAMINOPHEN 325 MG PO TABS
650.0000 mg | ORAL_TABLET | Freq: Four times a day (QID) | ORAL | Status: DC | PRN
  Administered 2023-10-15: 650 mg via ORAL
  Filled 2023-10-14: qty 2

## 2023-10-14 MED ORDER — MIRTAZAPINE 15 MG PO TABS
7.5000 mg | ORAL_TABLET | Freq: Every day | ORAL | Status: DC
Start: 2023-10-14 — End: 2023-10-15
  Administered 2023-10-15: 7.5 mg via ORAL
  Filled 2023-10-14: qty 1

## 2023-10-14 MED ORDER — DEXTROSE IN LACTATED RINGERS 5 % IV SOLN: INTRAVENOUS | Status: DC

## 2023-10-14 MED ORDER — ACETAMINOPHEN 650 MG RE SUPP: 650.0000 mg | Freq: Four times a day (QID) | RECTAL | Status: DC | PRN

## 2023-10-14 MED ORDER — POTASSIUM CHLORIDE CRYS ER 20 MEQ PO TBCR
40.0000 meq | EXTENDED_RELEASE_TABLET | Freq: Once | ORAL | Status: AC
  Administered 2023-10-15: 40 meq via ORAL
  Filled 2023-10-14: qty 2

## 2023-10-14 MED ORDER — ONDANSETRON HCL 4 MG/2ML IJ SOLN
4.0000 mg | Freq: Once | INTRAMUSCULAR | Status: AC
  Administered 2023-10-14: 4 mg via INTRAVENOUS
  Filled 2023-10-14: qty 2

## 2023-10-14 MED ORDER — LEVOTHYROXINE SODIUM 25 MCG PO TABS: 37.5000 ug | ORAL_TABLET | ORAL | Status: DC

## 2023-10-14 MED ORDER — FAMOTIDINE 20 MG PO TABS
20.0000 mg | ORAL_TABLET | Freq: Every day | ORAL | Status: DC
  Administered 2023-10-15: 20 mg via ORAL
  Filled 2023-10-14: qty 1

## 2023-10-14 MED ORDER — MAGNESIUM SULFATE 4 GM/100ML IV SOLN
4.0000 g | Freq: Once | INTRAVENOUS | Status: AC
  Administered 2023-10-14: 4 g via INTRAVENOUS
  Filled 2023-10-14: qty 100

## 2023-10-14 MED ORDER — ATORVASTATIN CALCIUM 10 MG PO TABS
10.0000 mg | ORAL_TABLET | Freq: Every day | ORAL | Status: DC
  Administered 2023-10-15: 10 mg via ORAL
  Filled 2023-10-14: qty 1

## 2023-10-14 MED ORDER — POTASSIUM CHLORIDE CRYS ER 20 MEQ PO TBCR
40.0000 meq | EXTENDED_RELEASE_TABLET | Freq: Once | ORAL | Status: AC
  Administered 2023-10-14: 40 meq via ORAL
  Filled 2023-10-14: qty 2

## 2023-10-14 MED ORDER — PANTOPRAZOLE SODIUM 40 MG PO TBEC
40.0000 mg | DELAYED_RELEASE_TABLET | Freq: Two times a day (BID) | ORAL | Status: DC
  Administered 2023-10-15 (×2): 40 mg via ORAL
  Filled 2023-10-14 (×2): qty 1

## 2023-10-14 NOTE — H&P (Signed)
 History and Physical    Patient: Kelly Douglas FMW:[id number] DOB: 1955/12/24 DOA: 10/14/2023 DOS: the patient was seen and examined on 10/14/2023 PCP: Antonetta Rollene BRAVO, MD  Patient coming from: Home  Chief Complaint:  Chief Complaint  Patient presents with   Weakness   HPI: Kelly Douglas is a 68 y.o. female with medical history significant of anxiety, arthritis, chronic pain syndrome, chronic bronchitis, hyperlipidemia, hypothyroid, hypertension and scleroderma who presented with fatigue and generalized weakness.  Patient reports 2 weeks of worsening generalized weakness and poor appetite. Progressive symptoms to the point where she has to hold on the wall in order to walk.  Few days sustained a mechanical fall with head trauma but no loss of consciousness.  Denies nausea or vomiting, no diarrhea, dyspnea or chest pain.  She ran out of her magnesium  and potassium supplements.   Because of persistent and worsening symptoms her son brought her to the ED.   Review of Systems: As mentioned in the history of present illness. All other systems reviewed and are negative. Past Medical History:  Diagnosis Date   Anxiety    Arteriosclerotic cardiovascular disease (ASCVD) 2013   coronary calcification-left main, LAD and CX; small pericardial effusion   Arthritis    hips   Cancer (HCC)    Lung Cancer    Chronic back pain    Chronic bronchitis    Chronic lung disease    Chronic scarring and volume loss-left lung; characteristics of a chronic infectious process-possible MAI   Depression    Dyspnea    occasional    GERD (gastroesophageal reflux disease)    Dr Shaaron EGD 09/2009->esophagitis, sm HH, antral erosions, atonic esophagus   History of radiation therapy    Left and Right Lung- 11/24/21-12/07/21- Dr. Lynwood Nasuti   Hyperlipidemia    Hypertension    Hypothyroid 1981 approx   Scleroderma (HCC)    Seasonal allergies    Syncope    Multiple spells over the past 40+ years,  likely neurocardiogenic   Tobacco abuse 06/24/2009   Past Surgical History:  Procedure Laterality Date   BILATERAL SALPINGOOPHORECTOMY  2013    Dr. Jayne; uterus remains in situ   BRONCHIAL BIOPSY  11/21/2019   Procedure: BRONCHIAL BIOPSIES;  Surgeon: Shelah Lamar RAMAN, MD;  Location: [facility] ENDOSCOPY;  Service: Pulmonary;;   BRONCHIAL BRUSHINGS  11/21/2019   Procedure: BRONCHIAL BRUSHINGS;  Surgeon: Shelah Lamar RAMAN, MD;  Location: [facility] ENDOSCOPY;  Service: Pulmonary;;   BRONCHIAL NEEDLE ASPIRATION BIOPSY  11/21/2019   Procedure: BRONCHIAL NEEDLE ASPIRATION BIOPSIES;  Surgeon: Shelah Lamar RAMAN, MD;  Location: [facility] ENDOSCOPY;  Service: Pulmonary;;   BRONCHIAL WASHINGS  11/21/2019   Procedure: BRONCHIAL WASHINGS;  Surgeon: Shelah Lamar RAMAN, MD;  Location: [facility] ENDOSCOPY;  Service: Pulmonary;;   COLONOSCOPY  09/28/09   anal papilla otherwise normal   COLONOSCOPY N/A 08/29/2012   MFM:Rnonwpr polyp-removed as described above. tubular adenoma   COLONOSCOPY WITH PROPOFOL  N/A 11/11/2019   Procedure: COLONOSCOPY WITH PROPOFOL ;  Surgeon: Shaaron Lamar HERO, MD;  Location: [facility] ENDO SUITE;  Service: Endoscopy;  Laterality: N/A;  2:30pm   ESOPHAGOGASTRODUODENOSCOPY  10/07/09   Dr. Verneice of esophageal mucosa, diffusely ?esophagitis (bx benign), small HH/antral erosions, erythema bx benign. 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 Hiatal hernia   FUDUCIAL PLACEMENT  10/18/2017   Procedure: PLACEMENT OF 3 FUDUCIAL INTO LEFT LOWER LOBE OF LUNG-TARGET  1;  Surgeon: Shelah Lamar RAMAN, MD;  Location: [facility] OR;  Service: Thoracic;;   LAPAROSCOPIC APPENDECTOMY  07/16/2011   Procedure: APPENDECTOMY LAPAROSCOPIC;  Surgeon: Thresa JAYSON Pulling, MD;  Location: [facility] ORS;  Service: General;  Laterality: N/A;   POLYPECTOMY  11/11/2019   Procedure: POLYPECTOMY;   Surgeon: Shaaron Lamar HERO, MD;  Location: [facility] ENDO SUITE;  Service: Endoscopy;;   TUBAL LIGATION     VIDEO BRONCHOSCOPY  10/18/2017   VIDEO BRONCHOSCOPY WITH ENDOBRONCHIAL NAVIGATION N/A 10/18/2017   Procedure: VIDEO BRONCHOSCOPY WITH ENDOBRONCHIAL NAVIGATION;  Surgeon: Shelah Lamar RAMAN, MD;  Location: [facility] OR;  Service: Thoracic;  Laterality: N/A;   VIDEO BRONCHOSCOPY WITH ENDOBRONCHIAL NAVIGATION Left 11/21/2019   Procedure: VIDEO BRONCHOSCOPY WITH ENDOBRONCHIAL NAVIGATION;  Surgeon: Shelah Lamar RAMAN, MD;  Location: [facility] ENDOSCOPY;  Service: Pulmonary;  Laterality: Left;   Social History:  reports that she has been smoking cigarettes. She started smoking about 52 years ago. She has a 26.1 pack-year smoking history. She has never used smokeless tobacco. She reports that she does not currently use alcohol. She reports that she does not use drugs.  Allergies  Allergen Reactions   Aspirin  Other (See Comments)    Nose bleeds    Family History  Problem Relation Age of Onset   Aneurysm Mother    Stroke Father    Aneurysm Father    Hypertension Sister    Cancer Brother        throat cancer   Coronary artery disease Brother    Hypertension Brother    Down syndrome Brother    Hypertension Brother    Hypertension Brother        father/mother   Throat cancer Brother 49       throat cancer   Diabetes Brother    Diabetes Brother    Anesthesia problems Neg Hx    Hypotension Neg Hx    Malignant hyperthermia Neg Hx    Pseudochol deficiency Neg Hx    Colon cancer Neg Hx     Prior to Admission medications   Medication Sig Start Date End Date Taking? Authorizing Provider  acetaminophen  (TYLENOL ) 650 MG CR tablet Take 1,300 mg by mouth every 8 (eight) hours as needed for pain.     [provider]  albuterol  (VENTOLIN  HFA) 108 (90 Base) MCG/ACT inhaler Inhale 1-2 puffs into the lungs every 6 (six) hours as needed for wheezing or shortness of breath. 07/18/23   Antonetta Rollene BRAVO, MD   amLODipine  (NORVASC ) 5 MG tablet TAKE 1 TABLET EVERY DAY 02/22/23   Antonetta Rollene BRAVO, MD  atorvastatin  (LIPITOR) 10 MG tablet TAKE 1 TABLET EVERY DAY 01/09/23   Antonetta Rollene BRAVO, MD  DULoxetine  (CYMBALTA ) 60 MG capsule TAKE 1 CAPSULE EVERY DAY 09/21/23   Antonetta Rollene BRAVO, MD  famotidine  (PEPCID ) 20 MG tablet TAKE 1 TABLET EVERY DAY 02/22/23   Antonetta Rollene BRAVO, MD  ferrous sulfate  325 (65 FE) MG EC tablet Take 325 mg by mouth daily with breakfast.    [provider]  hydrochlorothiazide  (HYDRODIURIL ) 12.5 MG tablet Tak one tablet by mouth once daily for blood pressure 07/18/23   Antonetta Rollene BRAVO, MD  levothyroxine  (SYNTHROID ) 75 MCG tablet TAKE 1 TABLET EVERY OTHER DAY ALTERNATING WITH 1/2 TABLET EVERY OTHER DAY 07/10/23   Antonetta Rollene BRAVO, MD  meloxicam  (MOBIC ) 7.5 MG tablet TAKE 1 TABLET EVERY DAY 09/21/23   Antonetta Rollene BRAVO, MD  mirtazapine  (REMERON ) 7.5 MG tablet TAKE 1 TABLET AT BEDTIME 09/21/23  Tobie Suzzane POUR, MD  montelukast  (SINGULAIR ) 10 MG tablet TAKE 1 TABLET AT BEDTIME 02/22/23   Antonetta Rollene BRAVO, MD  Multiple Vitamin (MULTIVITAMIN WITH MINERALS) TABS tablet Take 1 tablet by mouth daily.    [provider]  pantoprazole  (PROTONIX ) 40 MG tablet TAKE 1 TABLET TWICE DAILY 09/21/23   Antonetta Rollene BRAVO, MD  potassium chloride  (KLOR-CON  M) 10 MEQ tablet Take 1 tablet (10 mEq total) by mouth 2 (two) times daily. 07/18/23   Antonetta Rollene BRAVO, MD    Physical Exam: Vitals:   10/14/23 2100 10/14/23 2130 10/14/23 2200 10/14/23 2230  BP: 123/72 124/73 135/71 (!) 122/96  Pulse: 79 77 74 94  Resp: 17 18 17 18   Temp:   98.1 F (36.7 C)   TempSrc:      SpO2: 94% 98% 95% 91%  Weight:      Height:       BP 113/82   Pulse 88   Temp 98.1 F (36.7 C)   Resp 15   Ht 5' 8 (1.727 m)   Wt 70.3 kg   LMP  (LMP Unknown)   SpO2 95%   BMI 23.57 kg/m   Neurology awake and alert, deconditioned and ill looking appearing ENT with positive pallor with no icterus, dry  oral mucosa Respiratory with no rales or wheezing, no rhonchi  Heart with S1 and S2 present and regular with no gallops, rubs or murmurs Abdomen with no distention, soft and non tender No lower extremity edema. No rashes.  Data Reviewed:   Na 139, K 2.8 Cl 99 bicarbonate 23 glucose 142 bun 9 cr 0,95  Mag <0.5  Lipase 24  AST 24 and ALT 23   CT abdomen and pelvis with no acute findings. Linear and ground glass opacities in the left lower lobe. Favor scarring.   #1 EKG with positive artifact #2 EKG 81 bpm, normal axis, normal intervals, qtc 488, sinus rhythm with no significant ST segment or T wave changes.  Assessment and Plan: * Hypomagnesemia Hypokalemia.   Plan to continue electrolyte correction, with IV mag sulfate and oral Kcl.  In the ED she had 4 g Mag sulfate and 40 meq Kcl Will add extra 40 meq and follow up electrolytes in am  Continue IV fluids with balanced electrolyte solutions and dextrose at 100 ml per day.  Hold on hydrochlorothiazide     HTN (hypertension) Continue blood pressure control with amlodipine  Hold on hydrochlorothiazide , due to electrolyte disturbances avoid diuretics.   Hyperlipidemia Continue with atorvastatin    COPD (chronic obstructive pulmonary disease) (HCC) No sing of acute exacerbation, continue bronchodilator therapy.   Hypothyroidism Continue with levothyroxine  75 mcg every other day, intercalated with 37.5 mcg   GERD (gastroesophageal reflux disease) Continue antiacid therapy with pantoprazole .   Anxiety and depression Continue with mirtazapine , and duloxetine    IDA (iron deficiency anemia) Continue iron supplementation with Ferrous sulfate . Hgb has been stable 12.   Scleroderma (HCC) Follow up as outpatient.    Advance Care Planning:   Code Status: Full Code   Consults: none   Family Communication: I spoke with patient's son at the bedside, we talked in detail about patient's condition, plan of care and prognosis and  all questions were addressed.  Severity of Illness: The appropriate patient status for this patient is OBSERVATION. Observation status is judged to be reasonable and necessary in order to provide the required intensity of service to ensure the patient's safety. The patient's presenting symptoms, physical exam findings, and initial  radiographic and laboratory data in the context of their medical condition is felt to place them at decreased risk for further clinical deterioration. Furthermore, it is anticipated that the patient will be medically stable for discharge from the hospital within 2 midnights of admission.   Author: Elidia Toribio Furnace, MD 10/14/2023 11:01 PM  For on call review [email or web address].

## 2023-10-14 NOTE — Assessment & Plan Note (Signed)
 Follow up as outpatient

## 2023-10-14 NOTE — Assessment & Plan Note (Signed)
 Continue with atorvastatin

## 2023-10-14 NOTE — Assessment & Plan Note (Signed)
 Hypokalemia.   Plan to continue electrolyte correction, with IV mag sulfate and oral Kcl.  In the ED she had 4 g Mag sulfate and 40 meq Kcl Will add extra 40 meq and follow up electrolytes in am  Continue IV fluids with balanced electrolyte solutions and dextrose at 100 ml per day.  Hold on hydrochlorothiazide 

## 2023-10-14 NOTE — Assessment & Plan Note (Signed)
Continue antiacid therapy with pantoprazole.  ?

## 2023-10-14 NOTE — Assessment & Plan Note (Addendum)
 Continue iron supplementation with Ferrous sulfate . Hgb has been stable 12.

## 2023-10-14 NOTE — Assessment & Plan Note (Signed)
 Continue blood pressure control with amlodipine  Hold on hydrochlorothiazide , due to electrolyte disturbances avoid diuretics.

## 2023-10-14 NOTE — Assessment & Plan Note (Signed)
 Continue with mirtazapine , and duloxetine 

## 2023-10-14 NOTE — ED Provider Notes (Addendum)
 [place] EMERGENCY DEPARTMENT AT [facility] Provider Note  CSN: [id number] Arrival date & time: 10/14/23 8076  Chief Complaint(s) Weakness  HPI Kelly Douglas is a 68 y.o. female history of GERD, scleroderma, hypertension, hyperlipidemia presenting to the emergency department with weakness.  Patient reports generalized weakness for the past few weeks.  No fevers, cough, runny nose, sore throat, dysuria, diarrhea, vomiting, leg swelling, chest pain, difficulty breathing.  She reports today she began experiencing abdominal pain and nausea but did not have this previously.  However, she reports she is mainly here for weakness.  Has not seen anybody for this.  Nothing about it really changed today other than the abdominal pain.   Past Medical History Past Medical History:  Diagnosis Date   Anxiety    Arteriosclerotic cardiovascular disease (ASCVD) 2013   coronary calcification-left main, LAD and CX; small pericardial effusion   Arthritis    hips   Cancer (HCC)    Lung Cancer    Chronic back pain    Chronic bronchitis    Chronic lung disease    Chronic scarring and volume loss-left lung; characteristics of a chronic infectious process-possible MAI   Depression    Dyspnea    occasional    GERD (gastroesophageal reflux disease)    Dr Shaaron EGD 09/2009->esophagitis, sm HH, antral erosions, atonic esophagus   History of radiation therapy    Left and Right Lung- 11/24/21-12/07/21- Dr. Lynwood Nasuti   Hyperlipidemia    Hypertension    Hypothyroid 1981 approx   Scleroderma (HCC)    Seasonal allergies    Syncope    Multiple spells over the past 40+ years, likely neurocardiogenic   Tobacco abuse 06/24/2009   Patient Active Problem List   Diagnosis Date Noted   Pain in both lower extremities 09/08/2023   Change in stool caliber 09/08/2023   Poor appetite 09/08/2023   Bilateral edema of lower extremity 07/18/2023   Knee pain, right 07/18/2023   Chronic hip pain,  bilateral 07/18/2023   GAD (generalized anxiety disorder) 10/01/2022   Encounter for Medicare annual examination with abnormal findings 09/27/2022   Stress due to illness of family member 06/12/2022   Screening due 06/12/2022   Change in stool 02/07/2022   Lung nodule 09/23/2021   Pulmonary hypertension (HCC) 05/27/2020   Hypomagnesemia 03/10/2020   Elevated troponin    Leukocytosis    COPD (chronic obstructive pulmonary disease) (HCC) 12/31/2019   Pneumothorax on left 12/10/2019   Thrombocytosis 12/10/2019   Hx of adenomatous colonic polyps 09/03/2019   Cervical lymphadenopathy 05/16/2019   Hypokalemia 03/28/2018   S/P bronchoscopy 10/18/2017   Adenocarcinoma of left lung, stage 1 (HCC) 11/18/2016   Pollen allergies 05/19/2015   Nicotine  dependence 09/17/2013   Hoarseness of voice 09/16/2013   Cervical pain (neck) 06/21/2013   Back pain with radiation 06/21/2013   IDA (iron deficiency anemia) 08/10/2012   HTN (hypertension) 02/22/2012   Insomnia 05/09/2011   GERD (gastroesophageal reflux disease)    Scleroderma (HCC)    Hypothyroidism    Arteriosclerotic cardiovascular disease (ASCVD)    Syncope    Anxiety and depression 06/24/2009   Vitamin D  deficiency 03/18/2009   Hyperlipidemia 03/18/2009   Home Medication(s) Prior to Admission medications   Medication Sig Start Date End Date Taking? Authorizing Provider  acetaminophen  (TYLENOL ) 650 MG CR tablet Take 1,300 mg by mouth every 8 (eight) hours as needed for pain.     [provider]  albuterol  (VENTOLIN  HFA) 108 (90 Base)  MCG/ACT inhaler Inhale 1-2 puffs into the lungs every 6 (six) hours as needed for wheezing or shortness of breath. 07/18/23   Antonetta Rollene BRAVO, MD  amLODipine  (NORVASC ) 5 MG tablet TAKE 1 TABLET EVERY DAY 02/22/23   Antonetta Rollene BRAVO, MD  atorvastatin  (LIPITOR) 10 MG tablet TAKE 1 TABLET EVERY DAY 01/09/23   Antonetta Rollene BRAVO, MD  DULoxetine  (CYMBALTA ) 60 MG capsule TAKE 1 CAPSULE EVERY DAY  09/21/23   Antonetta Rollene BRAVO, MD  famotidine  (PEPCID ) 20 MG tablet TAKE 1 TABLET EVERY DAY 02/22/23   Antonetta Rollene BRAVO, MD  ferrous sulfate  325 (65 FE) MG EC tablet Take 325 mg by mouth daily with breakfast.    [provider]  hydrochlorothiazide  (HYDRODIURIL ) 12.5 MG tablet Tak one tablet by mouth once daily for blood pressure 07/18/23   Antonetta Rollene BRAVO, MD  levothyroxine  (SYNTHROID ) 75 MCG tablet TAKE 1 TABLET EVERY OTHER DAY ALTERNATING WITH 1/2 TABLET EVERY OTHER DAY 07/10/23   Antonetta Rollene BRAVO, MD  meloxicam  (MOBIC ) 7.5 MG tablet TAKE 1 TABLET EVERY DAY 09/21/23   Antonetta Rollene BRAVO, MD  mirtazapine  (REMERON ) 7.5 MG tablet TAKE 1 TABLET AT BEDTIME 09/21/23   Tobie Suzzane POUR, MD  montelukast  (SINGULAIR ) 10 MG tablet TAKE 1 TABLET AT BEDTIME 02/22/23   Antonetta Rollene BRAVO, MD  Multiple Vitamin (MULTIVITAMIN WITH MINERALS) TABS tablet Take 1 tablet by mouth daily.    [provider]  pantoprazole  (PROTONIX ) 40 MG tablet TAKE 1 TABLET TWICE DAILY 09/21/23   Antonetta Rollene BRAVO, MD  potassium chloride  (KLOR-CON  M) 10 MEQ tablet Take 1 tablet (10 mEq total) by mouth 2 (two) times daily. 07/18/23   Antonetta Rollene BRAVO, MD                                                                                                                                    Past Surgical History Past Surgical History:  Procedure Laterality Date   BILATERAL SALPINGOOPHORECTOMY  2013    Dr. Jayne; uterus remains in situ   BRONCHIAL BIOPSY  11/21/2019   Procedure: BRONCHIAL BIOPSIES;  Surgeon: Shelah Lamar RAMAN, MD;  Location: [facility] ENDOSCOPY;  Service: Pulmonary;;   BRONCHIAL BRUSHINGS  11/21/2019   Procedure: BRONCHIAL BRUSHINGS;  Surgeon: Shelah Lamar RAMAN, MD;  Location: [facility] ENDOSCOPY;  Service: Pulmonary;;   BRONCHIAL NEEDLE ASPIRATION BIOPSY  11/21/2019   Procedure: BRONCHIAL NEEDLE ASPIRATION BIOPSIES;  Surgeon: Shelah Lamar RAMAN, MD;  Location: [facility] ENDOSCOPY;  Service: Pulmonary;;   BRONCHIAL WASHINGS  11/21/2019    Procedure: BRONCHIAL WASHINGS;  Surgeon: Shelah Lamar RAMAN, MD;  Location: [facility] ENDOSCOPY;  Service: Pulmonary;;   COLONOSCOPY  09/28/09   anal papilla otherwise normal   COLONOSCOPY N/A 08/29/2012   MFM:Rnonwpr polyp-removed as described above. tubular adenoma   COLONOSCOPY WITH PROPOFOL  N/A 11/11/2019   Procedure: COLONOSCOPY WITH PROPOFOL ;  Surgeon: Shaaron Lamar HERO, MD;  Location: [facility] ENDO SUITE;  Service: Endoscopy;  Laterality: N/A;  2:30pm   ESOPHAGOGASTRODUODENOSCOPY  10/07/09   Dr. Verneice of esophageal mucosa, diffusely ?esophagitis (bx benign), small HH/antral erosions, erythema bx benign. 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 Hiatal hernia   FUDUCIAL PLACEMENT  10/18/2017   Procedure: PLACEMENT OF 3 FUDUCIAL INTO LEFT LOWER LOBE OF LUNG-TARGET 1;  Surgeon: Shelah Lamar RAMAN, MD;  Location: [facility] OR;  Service: Thoracic;;   LAPAROSCOPIC APPENDECTOMY  07/16/2011   Procedure: APPENDECTOMY LAPAROSCOPIC;  Surgeon: Thresa JAYSON Pulling, MD;  Location: [facility] ORS;  Service: General;  Laterality: N/A;   POLYPECTOMY  11/11/2019   Procedure: POLYPECTOMY;  Surgeon: Shaaron Lamar HERO, MD;  Location: [facility] ENDO SUITE;  Service: Endoscopy;;   TUBAL LIGATION     VIDEO BRONCHOSCOPY  10/18/2017   VIDEO BRONCHOSCOPY WITH ENDOBRONCHIAL NAVIGATION N/A 10/18/2017   Procedure: VIDEO BRONCHOSCOPY WITH ENDOBRONCHIAL NAVIGATION;  Surgeon: Shelah Lamar RAMAN, MD;  Location: [facility] OR;  Service: Thoracic;  Laterality: N/A;   VIDEO BRONCHOSCOPY WITH ENDOBRONCHIAL NAVIGATION Left 11/21/2019   Procedure: VIDEO BRONCHOSCOPY WITH ENDOBRONCHIAL NAVIGATION;  Surgeon: Shelah Lamar RAMAN, MD;  Location: [facility] ENDOSCOPY;  Service: Pulmonary;  Laterality: Left;   Family History Family History  Problem Relation Age of Onset   Aneurysm Mother    Stroke Father     Aneurysm Father    Hypertension Sister    Cancer Brother        throat cancer   Coronary artery disease Brother    Hypertension Brother    Down syndrome Brother    Hypertension Brother    Hypertension Brother        father/mother   Throat cancer Brother 49       throat cancer   Diabetes Brother    Diabetes Brother    Anesthesia problems Neg Hx    Hypotension Neg Hx    Malignant hyperthermia Neg Hx    Pseudochol deficiency Neg Hx    Colon cancer Neg Hx     Social History Social History   Tobacco Use   Smoking status: Every Day    Current packs/day: 0.50    Average packs/day: 0.5 packs/day for 52.3 years (26.1 ttl pk-yrs)    Types: Cigarettes    Start date: 07/14/1971   Smokeless tobacco: Never   Tobacco comments:    0.5 packs of cigarettes smoked daily ARJ 12/31/19  Vaping Use   Vaping status: Never Used  Substance Use Topics   Alcohol use: Not Currently    Alcohol/week: 0.0 standard drinks of alcohol    Comment: quit June 2012-used to drink 6 pack daily, occasional beer per pt 11/20/19   Drug use: No   Allergies Aspirin   Review of Systems Review of Systems  All other systems reviewed and are negative.   Physical Exam Vital Signs  I have reviewed the triage vital signs BP (!) 122/96   Pulse 94   Temp 98.1 F (36.7 C)   Resp 18   Ht 5' 8 (1.727 m)   Wt 70.3 kg   LMP  (LMP Unknown)   SpO2 91%   BMI 23.57 kg/m  Physical Exam Vitals and nursing note reviewed.  Constitutional:      General: She is not in acute distress.    Appearance: She is well-developed.  HENT:     Head: Normocephalic and atraumatic.     Mouth/Throat:     Mouth: Mucous membranes are  dry.  Eyes:     Pupils: Pupils are equal, round, and reactive to light.  Cardiovascular:     Rate and Rhythm: Normal rate and regular rhythm.     Heart sounds: No murmur heard. Pulmonary:     Effort: Pulmonary effort is normal. No respiratory distress.     Breath sounds: Normal breath sounds.   Abdominal:     General: Abdomen is flat.     Palpations: Abdomen is soft.     Tenderness: There is abdominal tenderness (generalized).  Musculoskeletal:        General: No tenderness.     Right lower leg: No edema.     Left lower leg: No edema.  Skin:    General: Skin is warm and dry.  Neurological:     General: No focal deficit present.     Mental Status: She is alert. Mental status is at baseline.  Psychiatric:        Mood and Affect: Mood normal.        Behavior: Behavior normal.     ED Results and Treatments Labs (all labs ordered are listed, but only abnormal results are displayed) Labs Reviewed  CBC WITH DIFFERENTIAL/PLATELET - Abnormal; Notable for the following components:      Result Value   WBC 11.1 (*)    All other components within normal limits  COMPREHENSIVE METABOLIC PANEL WITH GFR - Abnormal; Notable for the following components:   Potassium 2.8 (*)    Glucose, Bld 142 (*)    Calcium  7.6 (*)    Anion gap 17 (*)    All other components within normal limits  MAGNESIUM  - Abnormal; Notable for the following components:   Magnesium  <0.5 (*)    All other components within normal limits  LIPASE, BLOOD  URINALYSIS, W/ REFLEX TO CULTURE (INFECTION SUSPECTED)                                                                                                                          Radiology CT ABDOMEN PELVIS W CONTRAST Result Date: 10/14/2023 CLINICAL DATA:  Abdominal pain, acute, nonlocalized. Generalized weakness. History of lung cancer. EXAM: CT ABDOMEN AND PELVIS WITH CONTRAST TECHNIQUE: Multidetector CT imaging of the abdomen and pelvis was performed using the standard protocol following bolus administration of intravenous contrast. RADIATION DOSE REDUCTION: This exam was performed according to the departmental dose-optimization program which includes automated exposure control, adjustment of the mA and/or kV according to patient size and/or use of iterative  reconstruction technique. CONTRAST:  OMNIPAQUE  IOHEXOL  300 MG/ML  SOLN COMPARISON:  05/07/2020 FINDINGS: Lower chest: Predominantly linear and ground-glass densities in the left lung base, favor scarring. No effusions. Hepatobiliary: No focal hepatic abnormality. Gallbladder unremarkable. Pancreas: No focal abnormality or ductal dilatation. Spleen: No focal abnormality.  Normal size. Adrenals/Urinary Tract: No adrenal abnormality. No focal renal abnormality. No stones or hydronephrosis. Urinary bladder is unremarkable. Stomach/Bowel: Stomach, large and small bowel grossly unremarkable. Vascular/Lymphatic: Aortic atherosclerosis. No  evidence of aneurysm or adenopathy. Reproductive: Uterus and adnexa unremarkable.  No mass. Other: No free fluid or free air. Musculoskeletal: No acute bony abnormality. IMPRESSION: No acute findings in the abdomen or pelvis. Aortic atherosclerosis. Linear and ground-glass opacities in the left lower lobe, favor scarring Electronically Signed   By: Franky Crease M.D.   On: 10/14/2023 22:25   DG Chest Portable 1 View Result Date: 10/14/2023 CLINICAL DATA:  Chest pain EXAM: PORTABLE CHEST 1 VIEW COMPARISON:  06/12/2023 FINDINGS: Shift of mediastinal structures to the left, stable. Heart is normal size. Linear atelectasis or scarring in the left lung base. Left apical scarring. No confluent opacity on the right. No effusions or acute bony abnormality. IMPRESSION: Stable shift of mediastinal structures to the left with left base atelectasis or scarring. No active disease. Electronically Signed   By: Franky Crease M.D.   On: 10/14/2023 20:40    Pertinent labs & imaging results that were available during my care of the patient were reviewed by me and considered in my medical decision making (see MDM for details).  Medications Ordered in ED Medications  magnesium  sulfate IVPB 4 g 100 mL (4 g Intravenous New Bag/Given 10/14/23 2229)  sodium chloride  0.9 % bolus 1,000 mL (0 mLs  Intravenous Stopped 10/14/23 2229)  ondansetron  (ZOFRAN ) injection 4 mg (4 mg Intravenous Given 10/14/23 2055)  HYDROmorphone  (DILAUDID ) injection 0.5 mg (0.5 mg Intravenous Given 10/14/23 2056)  iohexol  (OMNIPAQUE ) 300 MG/ML solution 100 mL (100 mLs Intravenous Contrast Given 10/14/23 2207)  potassium chloride  SA (KLOR-CON  M) CR tablet 40 mEq (40 mEq Oral Given 10/14/23 2222)                                                                                                                                     Procedures .Critical Care  Performed by: Francesca Elsie CROME, MD Authorized by: Francesca Elsie CROME, MD   Critical care provider statement:    Critical care time (minutes):  30   Critical care was necessary to treat or prevent imminent or life-threatening deterioration of the following conditions:  Metabolic crisis   Critical care was time spent personally by me on the following activities:  Development of treatment plan with patient or surrogate, discussions with consultants, evaluation of patient's response to treatment, examination of patient, ordering and review of laboratory studies, ordering and review of radiographic studies, ordering and performing treatments and interventions, pulse oximetry, re-evaluation of patient's condition and review of old charts   (including critical care time)  Medical Decision Making / ED Course   MDM:  68 year old presents to the emergency room with generalized weakness, abdominal pain.  Patient overall well-appearing, vital signs reassuring.  No fever, no tachycardia.  Exam with some mild dehydration and mild diffuse abdominal tenderness without any focal tenderness.  Differential includes dehydration, toxic metabolic abnormality, occult infectious process, deconditioning.  Will check broad lab testing.  Given abdominal tenderness  will check CT scan as well.  Will obtain x-ray of the chest and urinalysis.  Will reassess.  Patient is overall  well-appearing with normal vital signs if workup is reassuring anticipate likely discharge to home with outpatient follow-up.  Clinical Course as of 10/14/23 2307  Sat Oct 14, 2023  2305 CT abdomen is negative.  Labs are notable for severe hypomagnesemia and hypokalemia.  Discussed with hospitalist who will admit patient for repletion.  She has received 4 g IV magnesium  in the ER. [WS]    Clinical Course User Index [WS] Francesca Elsie CROME, MD     Additional history obtained: -Additional history obtained from ems -External records from outside source obtained and reviewed including: Chart review including previous notes, labs, imaging, consultation notes including prior notes    Lab Tests: -I ordered, reviewed, and interpreted labs.   The pertinent results include:   Labs Reviewed  CBC WITH DIFFERENTIAL/PLATELET - Abnormal; Notable for the following components:      Result Value   WBC 11.1 (*)    All other components within normal limits  COMPREHENSIVE METABOLIC PANEL WITH GFR - Abnormal; Notable for the following components:   Potassium 2.8 (*)    Glucose, Bld 142 (*)    Calcium  7.6 (*)    Anion gap 17 (*)    All other components within normal limits  MAGNESIUM  - Abnormal; Notable for the following components:   Magnesium  <0.5 (*)    All other components within normal limits  LIPASE, BLOOD  URINALYSIS, W/ REFLEX TO CULTURE (INFECTION SUSPECTED)    Notable for mild leukocytosis, hypokalemia, hypomagnesemia   EKG   EKG Interpretation Date/Time:  Saturday October 14 2023 21:17:29 EDT Ventricular Rate:  81 PR Interval:  162 QRS Duration:  96 QT Interval:  420 QTC Calculation: 488 R Axis:   -1  Text Interpretation: Sinus rhythm Nonspecific T abnrm, anterolateral leads Borderline prolonged QT interval Confirmed by Francesca Elsie ([id number]) on 10/14/2023 9:43:46 PM         Imaging Studies ordered: I ordered imaging studies including CT abdomen On my interpretation  imaging demonstrates no acute process I independently visualized and interpreted imaging. I agree with the radiologist interpretation   Medicines ordered and prescription drug management: Meds ordered this encounter  Medications   sodium chloride  0.9 % bolus 1,000 mL   ondansetron  (ZOFRAN ) injection 4 mg   HYDROmorphone  (DILAUDID ) injection 0.5 mg   iohexol  (OMNIPAQUE ) 300 MG/ML solution 100 mL   magnesium  sulfate IVPB 4 g 100 mL   potassium chloride  SA (KLOR-CON  M) CR tablet 40 mEq    -I have reviewed the patients home medicines and have made adjustments as needed   Consultations Obtained: I requested consultation with the hospitalist,  and discussed lab and imaging findings as well as pertinent plan - they recommend: admission   Cardiac Monitoring: The patient was maintained on a cardiac monitor.  I personally viewed and interpreted the cardiac monitored which showed an underlying rhythm of: NSR  Social Determinants of Health:  Diagnosis or treatment significantly limited by social determinants of health: lives alone   Reevaluation: After the interventions noted above, I reevaluated the patient and found that their symptoms have improved  Co morbidities that complicate the patient evaluation  Past Medical History:  Diagnosis Date   Anxiety    Arteriosclerotic cardiovascular disease (ASCVD) 2013   coronary calcification-left main, LAD and CX; small pericardial effusion   Arthritis    hips   Cancer (HCC)  Lung Cancer    Chronic back pain    Chronic bronchitis    Chronic lung disease    Chronic scarring and volume loss-left lung; characteristics of a chronic infectious process-possible MAI   Depression    Dyspnea    occasional    GERD (gastroesophageal reflux disease)    Dr Shaaron EGD 09/2009->esophagitis, sm HH, antral erosions, atonic esophagus   History of radiation therapy    Left and Right Lung- 11/24/21-12/07/21- Dr. Lynwood Nasuti   Hyperlipidemia     Hypertension    Hypothyroid 1981 approx   Scleroderma (HCC)    Seasonal allergies    Syncope    Multiple spells over the past 40+ years, likely neurocardiogenic   Tobacco abuse 06/24/2009      Dispostion: Disposition decision including need for hospitalization was considered, and patient admitted to the hospital.    Final Clinical Impression(s) / ED Diagnoses Final diagnoses:  Hypomagnesemia  Hypokalemia     This chart was dictated using voice recognition software.  Despite best efforts to proofread,  errors can occur which can change the documentation meaning.    Francesca Elsie CROME, MD 10/14/23 2307    Francesca Elsie CROME, MD 10/14/23 [phone]

## 2023-10-14 NOTE — ED Notes (Signed)
 Gave report to [employer], Charity fundraiser

## 2023-10-14 NOTE — Assessment & Plan Note (Signed)
 No sing of acute exacerbation, continue bronchodilator therapy.

## 2023-10-14 NOTE — Assessment & Plan Note (Signed)
 Continue with levothyroxine  75 mcg every other day, intercalated with 37.5 mcg

## 2023-10-14 NOTE — ED Triage Notes (Signed)
 Pt BIB [facility] for generalized weakness for the last 2-3 weeks. Hx of lung CA and said that she has not had an appetite for the last couple of weeks. Feels nauseous at times.

## 2023-10-15 LAB — URINALYSIS, W/ REFLEX TO CULTURE (INFECTION SUSPECTED)
Bacteria, UA: NONE SEEN
Bilirubin Urine: NEGATIVE
Glucose, UA: NEGATIVE mg/dL
Hgb urine dipstick: NEGATIVE
Ketones, ur: NEGATIVE mg/dL
Leukocytes,Ua: NEGATIVE
Nitrite: NEGATIVE
Protein, ur: NEGATIVE mg/dL
Specific Gravity, Urine: 1.025 (ref 1.005–1.030)
pH: 5 (ref 5.0–8.0)

## 2023-10-15 LAB — CBC
HCT: 35.7 % — ABNORMAL LOW (ref 36.0–46.0)
Hemoglobin: 11.6 g/dL — ABNORMAL LOW (ref 12.0–15.0)
MCH: 26.1 pg (ref 26.0–34.0)
MCHC: 32.5 g/dL (ref 30.0–36.0)
MCV: 80.4 fL (ref 80.0–100.0)
Platelets: 310 K/uL (ref 150–400)
RBC: 4.44 MIL/uL (ref 3.87–5.11)
RDW: 14.8 % (ref 11.5–15.5)
WBC: 9.9 K/uL (ref 4.0–10.5)
nRBC: 0 % (ref 0.0–0.2)

## 2023-10-15 LAB — BASIC METABOLIC PANEL WITH GFR
Anion gap: 10 (ref 5–15)
BUN: 7 mg/dL — ABNORMAL LOW (ref 8–23)
CO2: 26 mmol/L (ref 22–32)
Calcium: 7.8 mg/dL — ABNORMAL LOW (ref 8.9–10.3)
Chloride: 103 mmol/L (ref 98–111)
Creatinine, Ser: 0.92 mg/dL (ref 0.44–1.00)
GFR, Estimated: >60 mL/min (ref >60)
Glucose, Bld: 127 mg/dL — ABNORMAL HIGH (ref 70–99)
Potassium: 3 mmol/L — ABNORMAL LOW (ref 3.5–5.1)
Sodium: 139 mmol/L (ref 135–145)

## 2023-10-15 LAB — HIV ANTIBODY (ROUTINE TESTING W REFLEX): HIV Screen 4th Generation wRfx: NONREACTIVE

## 2023-10-15 LAB — MAGNESIUM: Magnesium: 1.4 mg/dL — ABNORMAL LOW (ref 1.7–2.4)

## 2023-10-15 MED ORDER — LEVOTHYROXINE SODIUM 75 MCG PO TABS: 37.5000 ug | ORAL_TABLET | ORAL | Status: DC

## 2023-10-15 MED ORDER — MAGNESIUM SULFATE 2 GM/50ML IV SOLN
2.0000 g | Freq: Once | INTRAVENOUS | Status: AC
  Administered 2023-10-15: 2 g via INTRAVENOUS
  Filled 2023-10-15: qty 50

## 2023-10-15 MED ORDER — LEVOTHYROXINE SODIUM 75 MCG PO TABS: 75.0000 ug | ORAL_TABLET | ORAL | Status: DC

## 2023-10-15 MED ORDER — POTASSIUM CHLORIDE CRYS ER 20 MEQ PO TBCR
40.0000 meq | EXTENDED_RELEASE_TABLET | ORAL | Status: AC
  Administered 2023-10-15 (×2): 40 meq via ORAL
  Filled 2023-10-15 (×2): qty 2

## 2023-10-15 NOTE — Discharge Summary (Signed)
 Physician Discharge Summary  Kelly Douglas:[id number] DOB: March 02, 1955 DOA: 10/14/2023  PCP: Antonetta Rollene BRAVO, MD  Admit date: 10/14/2023  Discharge date: 10/15/2023  Admitted From: Home  Disposition: Home  Recommendations for Outpatient Follow-up:  Follow up with PCP in 1-2 weeks and follow-up BMP as well as blood pressures Discontinue further use of HCTZ as it appears that patient started a few weeks prior and has not been feeling well since then and likely this contributed to electrolyte abnormalities Continue other home medications as prior  Home Health: Yes with PT  Equipment/Devices: None  Discharge Condition:Stable  CODE STATUS: Full  Diet recommendation: Heart Healthy  Brief/Interim Summary: Kelly Douglas is a 68 y.o. female with medical history significant of anxiety, arthritis, chronic pain syndrome, chronic bronchitis, hyperlipidemia, hypothyroid, hypertension and scleroderma who presented with fatigue and generalized weakness.  Patient reports 2 weeks of worsening generalized weakness and poor appetite. Progressive symptoms to the point where she has to hold on the wall in order to walk.  Patient was admitted with severe electrolyte abnormalities to include hypomagnesemia and hypokalemia in the setting of hydrochlorothiazide  use.  Her blood pressures were somewhat soft as well and therefore HCTZ was discontinued.  She was given IV and p.o. repletion of her electrolytes with significant improvement noted and she overall feels much better and her appetite is returning.  She was assessed by PT for ambulation and it appears that she will require home health services.  She has been recommended to hold further use of HCTZ and continue taking her other medications as scheduled.  No other acute events or concerns noted.  Discharge Diagnoses:  Principal Problem:   Hypomagnesemia Active Problems:   HTN (hypertension)   Hyperlipidemia   COPD (chronic obstructive  pulmonary disease) (HCC)   Hypothyroidism   GERD (gastroesophageal reflux disease)   Anxiety and depression   IDA (iron deficiency anemia)   Scleroderma (HCC)  Principal discharge diagnosis: Symptomatic acute hypokalemia/hypomagnesemia in the setting of recent HCTZ initiation.  Discharge Instructions  Discharge Instructions     Diet - low sodium heart healthy   Complete by: As directed    Increase activity slowly   Complete by: As directed       Allergies as of 10/15/2023       Reactions   Aspirin  Other (See Comments)   Nose bleeds        Medication List     STOP taking these medications    hydrochlorothiazide  12.5 MG tablet Commonly known as: HYDRODIURIL        TAKE these medications    acetaminophen  650 MG CR tablet Commonly known as: TYLENOL  Take 1,300 mg by mouth every 8 (eight) hours as needed for pain.   albuterol  108 (90 Base) MCG/ACT inhaler Commonly known as: Ventolin  HFA Inhale 1-2 puffs into the lungs every 6 (six) hours as needed for wheezing or shortness of breath.   amLODipine  5 MG tablet Commonly known as: NORVASC  TAKE 1 TABLET EVERY DAY   atorvastatin  10 MG tablet Commonly known as: LIPITOR TAKE 1 TABLET EVERY DAY   DULoxetine  60 MG capsule Commonly known as: CYMBALTA  TAKE 1 CAPSULE EVERY DAY   famotidine  20 MG tablet Commonly known as: PEPCID  TAKE 1 TABLET EVERY DAY   ferrous sulfate  325 (65 FE) MG EC tablet Take 325 mg by mouth daily with breakfast.   levothyroxine  75 MCG tablet Commonly known as: SYNTHROID  TAKE 1 TABLET EVERY OTHER DAY ALTERNATING WITH 1/2 TABLET EVERY OTHER DAY  meloxicam  7.5 MG tablet Commonly known as: MOBIC  TAKE 1 TABLET EVERY DAY   mirtazapine  7.5 MG tablet Commonly known as: REMERON  TAKE 1 TABLET AT BEDTIME   montelukast  10 MG tablet Commonly known as: SINGULAIR  TAKE 1 TABLET AT BEDTIME   multivitamin with minerals Tabs tablet Take 1 tablet by mouth daily.   pantoprazole  40 MG  tablet Commonly known as: PROTONIX  TAKE 1 TABLET TWICE DAILY   potassium chloride  10 MEQ tablet Commonly known as: KLOR-CON  M Take 1 tablet (10 mEq total) by mouth 2 (two) times daily.        Follow-up Information     Antonetta Rollene BRAVO, MD. Schedule an appointment as soon as possible for a visit in 1 week(s).   Specialty: Family Medicine Contact information: [address] [place] [address] [phone]                Allergies  Allergen Reactions   Aspirin  Other (See Comments)    Nose bleeds    Consultations: None   Procedures/Studies: CT ABDOMEN PELVIS W CONTRAST Result Date: 10/14/2023 CLINICAL DATA:  Abdominal pain, acute, nonlocalized. Generalized weakness. History of lung cancer. EXAM: CT ABDOMEN AND PELVIS WITH CONTRAST TECHNIQUE: Multidetector CT imaging of the abdomen and pelvis was performed using the standard protocol following bolus administration of intravenous contrast. RADIATION DOSE REDUCTION: This exam was performed according to the departmental dose-optimization program which includes automated exposure control, adjustment of the mA and/or kV according to patient size and/or use of iterative reconstruction technique. CONTRAST:  OMNIPAQUE  IOHEXOL  300 MG/ML  SOLN COMPARISON:  05/07/2020 FINDINGS: Lower chest: Predominantly linear and ground-glass densities in the left lung base, favor scarring. No effusions. Hepatobiliary: No focal hepatic abnormality. Gallbladder unremarkable. Pancreas: No focal abnormality or ductal dilatation. Spleen: No focal abnormality.  Normal size. Adrenals/Urinary Tract: No adrenal abnormality. No focal renal abnormality. No stones or hydronephrosis. Urinary bladder is unremarkable. Stomach/Bowel: Stomach, large and small bowel grossly unremarkable. Vascular/Lymphatic: Aortic atherosclerosis. No evidence of aneurysm or adenopathy. Reproductive: Uterus and adnexa unremarkable.  No mass. Other: No free fluid or  free air. Musculoskeletal: No acute bony abnormality. IMPRESSION: No acute findings in the abdomen or pelvis. Aortic atherosclerosis. Linear and ground-glass opacities in the left lower lobe, favor scarring Electronically Signed   By: Franky Crease M.D.   On: 10/14/2023 22:25   DG Chest Portable 1 View Result Date: 10/14/2023 CLINICAL DATA:  Chest pain EXAM: PORTABLE CHEST 1 VIEW COMPARISON:  06/12/2023 FINDINGS: Shift of mediastinal structures to the left, stable. Heart is normal size. Linear atelectasis or scarring in the left lung base. Left apical scarring. No confluent opacity on the right. No effusions or acute bony abnormality. IMPRESSION: Stable shift of mediastinal structures to the left with left base atelectasis or scarring. No active disease. Electronically Signed   By: Franky Crease M.D.   On: 10/14/2023 20:40     Discharge Exam: Vitals:   10/15/23 0340 10/15/23 0823  BP: 98/64 135/73  Pulse: 83 85  Resp: 17 18  Temp: 98 F (36.7 C) 97.8 F (36.6 C)  SpO2: 98% 98%   Vitals:   10/14/23 2300 10/15/23 0007 10/15/23 0340 10/15/23 0823  BP: 113/82 125/71 98/64 135/73  Pulse: 88 87 83 85  Resp: 15 16 17 18   Temp:  97.7 F (36.5 C) 98 F (36.7 C) 97.8 F (36.6 C)  TempSrc:   Oral Oral  SpO2: 95% 96% 98% 98%  Weight:  67.5 kg  Height:  5' 8 (1.727 m)      General: Pt is alert, awake, not in acute distress Cardiovascular: RRR, S1/S2 +, no rubs, no gallops Respiratory: CTA bilaterally, no wheezing, no rhonchi Abdominal: Soft, NT, ND, bowel sounds + Extremities: no edema, no cyanosis    The results of significant diagnostics from this hospitalization (including imaging, microbiology, ancillary and laboratory) are listed below for reference.     Microbiology: No results found for this or any previous visit (from the past 240 hours).   Labs: BNP (last 3 results) Recent Labs    06/12/23 1640  BNP 34.0   Basic Metabolic Panel: Recent Labs  Lab 10/14/23 2124  10/15/23 0427  NA 139 139  K 2.8* 3.0*  CL 99 103  CO2 23 26  GLUCOSE 142* 127*  BUN 9 7*  CREATININE 0.95 0.92  CALCIUM  7.6* 7.8*  MG <0.5* 1.4*   Liver Function Tests: Recent Labs  Lab 10/14/23 2124  AST 24  ALT 23  ALKPHOS 65  BILITOT 1.1  PROT 6.8  ALBUMIN 3.8   Recent Labs  Lab 10/14/23 2124  LIPASE 24   No results for input(s): AMMONIA in the last 168 hours. CBC: Recent Labs  Lab 10/14/23 2025 10/15/23 0427  WBC 11.1* 9.9  NEUTROABS 7.2  --   HGB 12.1 11.6*  HCT 36.9 35.7*  MCV 80.7 80.4  PLT 294 310   Cardiac Enzymes: No results for input(s): CKTOTAL, CKMB, CKMBINDEX, TROPONINI in the last 168 hours. BNP: Invalid input(s): POCBNP CBG: No results for input(s): GLUCAP in the last 168 hours. D-Dimer No results for input(s): DDIMER in the last 72 hours. Hgb A1c No results for input(s): HGBA1C in the last 72 hours. Lipid Profile No results for input(s): CHOL, HDL, LDLCALC, TRIG, CHOLHDL, LDLDIRECT in the last 72 hours. Thyroid  function studies No results for input(s): TSH, T4TOTAL, T3FREE, THYROIDAB in the last 72 hours.  Invalid input(s): FREET3 Anemia work up No results for input(s): VITAMINB12, FOLATE, FERRITIN, TIBC, IRON, RETICCTPCT in the last 72 hours. Urinalysis    Component Value Date/Time   COLORURINE YELLOW 10/15/2023 0310   APPEARANCEUR CLEAR 10/15/2023 0310   LABSPEC 1.025 10/15/2023 0310   PHURINE 5.0 10/15/2023 0310   GLUCOSEU NEGATIVE 10/15/2023 0310   HGBUR NEGATIVE 10/15/2023 0310   BILIRUBINUR NEGATIVE 10/15/2023 0310   BILIRUBINUR negative 12/19/2017 1549   KETONESUR NEGATIVE 10/15/2023 0310   PROTEINUR NEGATIVE 10/15/2023 0310   UROBILINOGEN 2.0 (A) 12/19/2017 1549   UROBILINOGEN 0.2 12/15/2013 1335   NITRITE NEGATIVE 10/15/2023 0310   LEUKOCYTESUR NEGATIVE 10/15/2023 0310   Sepsis Labs Recent Labs  Lab 10/14/23 2025 10/15/23 0427  WBC 11.1* 9.9    Microbiology No results found for this or any previous visit (from the past 240 hours).   Time coordinating discharge: 35 minutes  SIGNED:   Adron JONETTA Fairly, DO [facility] 10/15/2023, 10:46 AM  If 7PM-7AM, please contact night-coverage [email or web address]

## 2023-10-15 NOTE — Progress Notes (Addendum)
   10/15/23 1043  TOC Brief Assessment  Insurance and Status Reviewed  Patient has primary care physician Yes (SIMPSON, MARGARET E)  Home environment has been reviewed From home, spouse  Prior level of function: Independent  Prior/Current Home Services No current home services  Social Drivers of Health Review SDOH reviewed interventions complete  Readmission risk has been reviewed Yes  Transition of care needs no transition of care needs at this time   MOON delivered, pt sitting in chair, anxious to be going home soon.   Addendum: PT assessed, recommended HHPT- CSW followed up with pt to offer choice- pt had no choice asked to be matched with a provider available.  CSW contacted [facility]- who accepted pt.  No further ICM needs.

## 2023-10-15 NOTE — Care Management Obs Status (Signed)
 MEDICARE OBSERVATION STATUS NOTIFICATION   Patient Details  [name] MRN: [id number] Date of Birth: 09-08-1955   Medicare Observation Status Notification Given:  Yes    [name], LCSW 10/15/2023, 10:19 AM

## 2023-10-15 NOTE — Evaluation (Signed)
 Physical Therapy Evaluation Patient Details Name: Kelly Douglas MRN: [id number] DOB: [birth year] Today's Date: 10/15/2023  History of Present Illness  Kelly Douglas is a 67 y.o. female with medical history significant of anxiety, arthritis, chronic pain syndrome, chronic bronchitis, hyperlipidemia, hypothyroid, hypertension and scleroderma who presented with fatigue and generalized weakness.   Patient reports 2 weeks of worsening generalized weakness and poor appetite. Progressive symptoms to the point where she has to hold on the wall in order to walk.   Few days sustained a mechanical fall with head trauma but no loss of consciousness.   Denies nausea or vomiting, no diarrhea, dyspnea or chest pain.   She ran out of her magnesium  and potassium supplements.   Clinical Impression  Patient functioning near baseline for functional mobility and gait demonstrating good return for transferring to/from chair and commode in bathroom, tolerated ambulation in room, hallway without loss of balance or need for an AD and limited mostly due to fatigue. PLAN:  Patient to be discharged home today and discharged from acute physical therapy to care of nursing for ambulation as tolerated for length of stay with recommendations stated below          If plan is discharge home, recommend the following: A little help with walking and/or transfers;A little help with bathing/dressing/bathroom;Help with stairs or ramp for entrance;Assist for transportation;Assistance with cooking/housework   Can travel by private vehicle        Equipment Recommendations None recommended by PT  Recommendations for Other Services       Functional Status Assessment Patient has had a recent decline in their functional status and demonstrates the ability to make significant improvements in function in a reasonable and predictable amount of time.     Precautions / Restrictions Precautions Precautions: Fall Recall of  Precautions/Restrictions: Intact Restrictions Weight Bearing Restrictions Per Provider Order: No      Mobility  Bed Mobility Overal bed mobility: Modified Independent             General bed mobility comments: requires HOB partially raised    Transfers Overall transfer level: Modified independent Equipment used: None               General transfer comment: 100    Ambulation/Gait Ambulation/Gait assistance: Modified independent (Device/Increase time) Gait Distance (Feet): 100 Feet Assistive device: None Gait Pattern/deviations: Decreased step length - right, Decreased step length - left, Decreased stride length Gait velocity: decreased     General Gait Details: slightly labored movement without loss of balance or need for an AD and limited mostly due to c/o fatigue  Stairs            Wheelchair Mobility     Tilt Bed    Modified Rankin (Stroke Patients Only)       Balance Overall balance assessment: Mild deficits observed, not formally tested                                           Pertinent Vitals/Pain Pain Assessment Pain Assessment: Faces Faces Pain Scale: Hurts a little bit Pain Location: left side lower ribs Pain Descriptors / Indicators: Discomfort, Sore Pain Intervention(s): Limited activity within patient's tolerance, Monitored during session, Repositioned    Home Living Family/patient expects to be discharged to:: Private residence Living Arrangements: Spouse/significant other Available Help at Discharge: Family;Available PRN/intermittently Type of Home: House Home Access:  Stairs to enter Entrance Stairs-Rails: Doctor, general practice of Steps: 2   Home Layout: One level Home Equipment: Standard Walker      Prior Function Prior Level of Function : Independent/Modified Independent             Mobility Comments: household and short distanced community ambulation without AD ADLs Comments:  Indpendent for household, assisted for community (family picks up groceries for her)     Extremity/Trunk Assessment   Upper Extremity Assessment Upper Extremity Assessment: Overall WFL for tasks assessed    Lower Extremity Assessment Lower Extremity Assessment: Generalized weakness    Cervical / Trunk Assessment Cervical / Trunk Assessment: Normal  Communication   Communication Communication: No apparent difficulties    Cognition Arousal: Alert Behavior During Therapy: WFL for tasks assessed/performed   PT - Cognitive impairments: No apparent impairments                         Following commands: Intact       Cueing Cueing Techniques: Verbal cues     General Comments      Exercises     Assessment/Plan    PT Assessment All further PT needs can be met in the next venue of care  PT Problem List Decreased strength;Decreased activity tolerance;Decreased balance;Decreased mobility       PT Treatment Interventions      PT Goals (Current goals can be found in the Care Plan section)  Acute Rehab PT Goals Patient Stated Goal: return home with family to assist PT Goal Formulation: With patient Time For Goal Achievement: 10/15/23 Potential to Achieve Goals: Good    Frequency       Co-evaluation               AM-PAC PT 6 Clicks Mobility  Outcome Measure Help needed turning from your back to your side while in a flat bed without using bedrails?: None Help needed moving from lying on your back to sitting on the side of a flat bed without using bedrails?: None Help needed moving to and from a bed to a chair (including a wheelchair)?: None Help needed standing up from a chair using your arms (e.g., wheelchair or bedside chair)?: None Help needed to walk in hospital room?: A Little Help needed climbing 3-5 steps with a railing? : A Little 6 Click Score: 22    End of Session   Activity Tolerance: Patient tolerated treatment well;Patient limited  by fatigue Patient left: in chair;with call bell/phone within reach Nurse Communication: Mobility status PT Visit Diagnosis: Unsteadiness on feet (R26.81);Other abnormalities of gait and mobility (R26.89);Muscle weakness (generalized) (M62.81)    Time: 9062-9041 PT Time Calculation (min) (ACUTE ONLY): 21 min   Charges:   PT Evaluation $PT Eval Moderate Complexity: 1 Mod PT Treatments $Therapeutic Activity: 8-22 mins PT General Charges $$ ACUTE PT VISIT: 1 Visit         11:52 AM, 10/15/23 Lynwood Music, MPT Physical Therapist with [facility] [phone] office [phone] mobile phone

## 2023-10-15 NOTE — Progress Notes (Signed)
 Patient has been stable and able to get up with assistance.  Patient has been alert and able to answer questions appropriately. Vitals have been stable.  No new complaints since admission.

## 2023-10-16 ENCOUNTER — Telehealth: Payer: Self-pay

## 2023-10-16 DIAGNOSIS — E559 Vitamin D deficiency, unspecified: Secondary | ICD-10-CM | POA: Diagnosis not present

## 2023-10-16 DIAGNOSIS — E876 Hypokalemia: Secondary | ICD-10-CM | POA: Diagnosis not present

## 2023-10-16 DIAGNOSIS — J4489 Other specified chronic obstructive pulmonary disease: Secondary | ICD-10-CM | POA: Diagnosis not present

## 2023-10-16 DIAGNOSIS — I2510 Atherosclerotic heart disease of native coronary artery without angina pectoris: Secondary | ICD-10-CM | POA: Diagnosis not present

## 2023-10-16 DIAGNOSIS — M160 Bilateral primary osteoarthritis of hip: Secondary | ICD-10-CM | POA: Diagnosis not present

## 2023-10-16 DIAGNOSIS — T502X5D Adverse effect of carbonic-anhydrase inhibitors, benzothiadiazides and other diuretics, subsequent encounter: Secondary | ICD-10-CM | POA: Diagnosis not present

## 2023-10-16 DIAGNOSIS — M349 Systemic sclerosis, unspecified: Secondary | ICD-10-CM | POA: Diagnosis not present

## 2023-10-16 DIAGNOSIS — E860 Dehydration: Secondary | ICD-10-CM | POA: Diagnosis not present

## 2023-10-16 DIAGNOSIS — I10 Essential (primary) hypertension: Secondary | ICD-10-CM | POA: Diagnosis not present

## 2023-10-16 NOTE — Telephone Encounter (Signed)
 scheduled

## 2023-10-16 NOTE — Telephone Encounter (Signed)
 Copied from CRM [id number]. Topic: Appointments - Scheduling Inquiry for Clinic >> Oct 16, 2023  1:42 PM [name] wrote: Reason for CRM: Pt was released from the hospital on 09/27. There is not any openings for a hospital FU in 14 days. Please call to get pt in. [phone] (H)

## 2023-10-16 NOTE — Progress Notes (Unsigned)
 GI Office Note    Referring Provider: Antonetta Rollene BRAVO, MD Primary Care Physician:  Antonetta Rollene BRAVO, MD Primary Gastroenterologist: Lamar HERO.Rourk, MD  Date:  10/17/2023  ID:  Dickey SHAUNNA Drought, DOB 1956/01/08, MRN [id number]  Chief Complaint   Chief Complaint  Patient presents with   Constipation    Still having issues with constipation    History of Present Illness  NIANNA IGO is a 68 y.o. female with a history of COPD, anxiety, depression, GERD, HLD, HTN, hypothyroidism, scleroderma, chronic lung disease, left lung adenocarcinoma s/p XRT in 2023, and history of adenomatous colon polyps  presenting today with complaint of constipation.  Colonoscopy October 2021: -polyp in the rectum (tubular adenoma) -Sigmoid and descending diverticulosis -Advised repeat in 5 years, notify office if any rectal bleeding, abdominal pain, or significant change in bowel habits.   OV 03/29/2022 for change in stools.  Patient reported mucus in her stools with little balls that was ongoing for about 6 months.  Denies any melena or BRBPR.  Reported she did not have a normal bowel movement in 2 weeks and stated that it was a small amount when she did go.  Only having little balls of stool despite her urgency to have a bowel movement.  Also with some periumbilical pain and cramping that occurs about 3 times per month but states this is chronic for the last 40 years.  Denied any nausea or overt vomiting.  Does have some occasional regurgitation of acid with her reflux issues gets her heartburn medication for 2 days.  Mostly takes pantoprazole  once daily, sometimes twice daily.  Advised to start MiraLAX  17 g once daily and fiber supplement daily as well as increase fiber in diet.  Advised to use Gas-X or Beano for gas pains.  Continue pantoprazole  40 mg once to twice daily.  Consideration for colonoscopy if no improvement with constipation or if she develops any alarm symptoms.  Last office visit  05/30/22.Continues with constipation even with miralax  3 times weekly. Tries to follow high fiber diet. No melena, brbpr. Taking pantoprazole  once daily, sometimes twice daily and famotidine  at bedtime. Advised miralax  daily and fiber supplement. Continue daily PPI with additional dose as needed and famotidine  at bedtime.   Recent hospital admission 9/27-9/28 for fatigue and weakness and decreased appetite. Had significant hypomagnesemia (<0.5) and hypokalemia (2.8) with hydrochlorothiazide  use. Noted improvement in symptoms and reported appetite returning as well with electrolyte replacement. Underwent CT scan of the abdomen and pelvis with no acute findings in the abdomen and pelvis and linear ground glass opacities in the left lower lobe.   Today:  Discussed the use of AI scribe software for clinical note transcription with the patient, who gave verbal consent to proceed.  She has experienced unintentional weight loss over the past month and a half, with a significant decrease in appetite. She sometimes goes two days without eating and struggles to consume more than a couple of bites at a time. No nausea, vomiting, or trouble swallowing. Occasional heart palpitations and headaches are noted. Possible grief and life stress given her husband has a touch of dementia per her report.   She has been experiencing constipation with hard stools, described as Bristol type 1, and sometimes requires straining. Over-the-counter treatments like Miralax  and Benefiber have been ineffective. Drinking two or three glasses of milk induces diarrhea, which she uses to relieve constipation. She reports abdominal tenderness and occasional severe pain in the rib cage area, described as feeling like  a 'baby's foot stuck out.'  She has a history of lung cancer diagnosed two years ago, for which she received radiation therapy. She follows up with her doctor every six weeks and is scheduled for a visit in December.  She has a  history of scleroderma diagnosed approximately eight years ago, which she describes as causing 'darkness' and affecting her arteries and lungs adn skin.  She has experienced episodes of swelling in her feet and feelings of jitteriness. She was hospitalized for 24 hours recently due to low potassium and magnesium  levels, which she has experienced in the past. She attributes previous episodes to medication interactions, including a blood pressure medication that was recently changed.  She has a history of insomnia, which has been a lifelong issue. She reports difficulty sleeping at night and often stays awake until early morning, but she is able to sleep somewhat during the day.  She takes pantoprazole  for reflux, which she has been on for over a year. She takes it once or twice a day depending on her symptoms. She also has a history of anemia and takes iron supplements.  She reports a family history of multiple siblings passing away in the last four years, which has been emotionally challenging. Her husband is in the early stages of dementia, adding to her stress.      Wt Readings from Last 5 Encounters:  10/17/23 154 lb 12.8 oz (70.2 kg)  10/15/23 148 lb 14.4 oz (67.5 kg)  09/05/23 156 lb (70.8 kg)  08/31/23 156 lb 1.9 oz (70.8 kg)  07/18/23 162 lb 0.6 oz (73.5 kg)    Current Outpatient Medications  Medication Sig Dispense Refill   acetaminophen  (TYLENOL ) 650 MG CR tablet Take 1,300 mg by mouth every 8 (eight) hours as needed for pain.      albuterol  (VENTOLIN  HFA) 108 (90 Base) MCG/ACT inhaler Inhale 1-2 puffs into the lungs every 6 (six) hours as needed for wheezing or shortness of breath. 54 g 3   amLODipine  (NORVASC ) 5 MG tablet TAKE 1 TABLET EVERY DAY 90 tablet 3   atorvastatin  (LIPITOR) 10 MG tablet TAKE 1 TABLET EVERY DAY 90 tablet 3   DULoxetine  (CYMBALTA ) 60 MG capsule TAKE 1 CAPSULE EVERY DAY 90 capsule 3   ferrous sulfate  325 (65 FE) MG EC tablet Take 325 mg by mouth daily with  breakfast.     levothyroxine  (SYNTHROID ) 75 MCG tablet TAKE 1 TABLET EVERY OTHER DAY ALTERNATING WITH 1/2 TABLET EVERY OTHER DAY 66 tablet 3   meloxicam  (MOBIC ) 7.5 MG tablet TAKE 1 TABLET EVERY DAY 90 tablet 3   mirtazapine  (REMERON ) 7.5 MG tablet TAKE 1 TABLET AT BEDTIME 90 tablet 3   montelukast  (SINGULAIR ) 10 MG tablet TAKE 1 TABLET AT BEDTIME 90 tablet 3   Multiple Vitamin (MULTIVITAMIN WITH MINERALS) TABS tablet Take 1 tablet by mouth daily.     pantoprazole  (PROTONIX ) 40 MG tablet TAKE 1 TABLET TWICE DAILY 180 tablet 3   potassium chloride  (KLOR-CON  M) 10 MEQ tablet Take 1 tablet (10 mEq total) by mouth 2 (two) times daily. 180 tablet 2   famotidine  (PEPCID ) 20 MG tablet TAKE 1 TABLET EVERY DAY (Patient not taking: Reported on 10/17/2023) 90 tablet 3   No current facility-administered medications for this visit.    Past Medical History:  Diagnosis Date   Anxiety    Arteriosclerotic cardiovascular disease (ASCVD) 2013   coronary calcification-left main, LAD and CX; small pericardial effusion   Arthritis    hips  Cancer (HCC)    Lung Cancer    Chronic back pain    Chronic bronchitis    Chronic lung disease    Chronic scarring and volume loss-left lung; characteristics of a chronic infectious process-possible MAI   Depression    Dyspnea    occasional    GERD (gastroesophageal reflux disease)    Dr Shaaron EGD 09/2009->esophagitis, sm HH, antral erosions, atonic esophagus   History of radiation therapy    Left and Right Lung- 11/24/21-12/07/21- Dr. Lynwood Nasuti   Hyperlipidemia    Hypertension    Hypothyroid 1981 approx   Scleroderma (HCC)    Seasonal allergies    Syncope    Multiple spells over the past 40+ years, likely neurocardiogenic   Tobacco abuse 06/24/2009    Past Surgical History:  Procedure Laterality Date   BILATERAL SALPINGOOPHORECTOMY  2013    Dr. Jayne; uterus remains in situ   BRONCHIAL BIOPSY  11/21/2019   Procedure: BRONCHIAL BIOPSIES;  Surgeon: Shelah Lamar RAMAN, MD;  Location: [facility] ENDOSCOPY;  Service: Pulmonary;;   BRONCHIAL BRUSHINGS  11/21/2019   Procedure: BRONCHIAL BRUSHINGS;  Surgeon: Shelah Lamar RAMAN, MD;  Location: [facility] ENDOSCOPY;  Service: Pulmonary;;   BRONCHIAL NEEDLE ASPIRATION BIOPSY  11/21/2019   Procedure: BRONCHIAL NEEDLE ASPIRATION BIOPSIES;  Surgeon: Shelah Lamar RAMAN, MD;  Location: [facility] ENDOSCOPY;  Service: Pulmonary;;   BRONCHIAL WASHINGS  11/21/2019   Procedure: BRONCHIAL WASHINGS;  Surgeon: Shelah Lamar RAMAN, MD;  Location: [facility] ENDOSCOPY;  Service: Pulmonary;;   COLONOSCOPY  09/28/09   anal papilla otherwise normal   COLONOSCOPY N/A 08/29/2012   MFM:Rnonwpr polyp-removed as described above. tubular adenoma   COLONOSCOPY WITH PROPOFOL  N/A 11/11/2019   Procedure: COLONOSCOPY WITH PROPOFOL ;  Surgeon: Shaaron Lamar HERO, MD;  Location: [facility] ENDO SUITE;  Service: Endoscopy;  Laterality: N/A;  2:30pm   ESOPHAGOGASTRODUODENOSCOPY  10/07/09   Dr. Verneice of esophageal mucosa, diffusely ?esophagitis (bx benign), small HH/antral erosions, erythema bx benign. 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 Hiatal hernia   FUDUCIAL PLACEMENT  10/18/2017   Procedure: PLACEMENT OF 3 FUDUCIAL INTO LEFT LOWER LOBE OF LUNG-TARGET 1;  Surgeon: Shelah Lamar RAMAN, MD;  Location: [facility] OR;  Service: Thoracic;;   LAPAROSCOPIC APPENDECTOMY  07/16/2011   Procedure: APPENDECTOMY LAPAROSCOPIC;  Surgeon: Thresa JAYSON Pulling, MD;  Location: [facility] ORS;  Service: General;  Laterality: N/A;   POLYPECTOMY  11/11/2019   Procedure: POLYPECTOMY;  Surgeon: Shaaron Lamar HERO, MD;  Location: [facility] ENDO SUITE;  Service: Endoscopy;;   TUBAL LIGATION     VIDEO BRONCHOSCOPY  10/18/2017   VIDEO BRONCHOSCOPY WITH ENDOBRONCHIAL NAVIGATION N/A 10/18/2017   Procedure: VIDEO BRONCHOSCOPY WITH ENDOBRONCHIAL NAVIGATION;   Surgeon: Shelah Lamar RAMAN, MD;  Location: [facility] OR;  Service: Thoracic;  Laterality: N/A;   VIDEO BRONCHOSCOPY WITH ENDOBRONCHIAL NAVIGATION Left 11/21/2019   Procedure: VIDEO BRONCHOSCOPY WITH ENDOBRONCHIAL NAVIGATION;  Surgeon: Shelah Lamar RAMAN, MD;  Location: [facility] ENDOSCOPY;  Service: Pulmonary;  Laterality: Left;    Family History  Problem Relation Age of Onset   Aneurysm Mother    Stroke Father    Aneurysm Father    Hypertension Sister    Cancer Brother        throat cancer   Coronary artery disease Brother    Hypertension Brother    Down syndrome Brother    Hypertension Brother    Hypertension Brother  father/mother   Throat cancer Brother 49       throat cancer   Diabetes Brother    Diabetes Brother    Anesthesia problems Neg Hx    Hypotension Neg Hx    Malignant hyperthermia Neg Hx    Pseudochol deficiency Neg Hx    Colon cancer Neg Hx     Allergies as of 10/17/2023 - Review Complete 10/17/2023  Allergen Reaction Noted   Aspirin  Other (See Comments) 05/19/2015    Social History   Socioeconomic History   Marital status: Married    Spouse name: Not on file   Number of children: 2   Years of education: Not on file   Highest education level: Not on file  Occupational History   Occupation: disabled    Employer: NOT EMPLOYED  Tobacco Use   Smoking status: Every Day    Current packs/day: 0.50    Average packs/day: 0.5 packs/day for 52.3 years (26.1 ttl pk-yrs)    Types: Cigarettes    Start date: 07/14/1971   Smokeless tobacco: Never   Tobacco comments:    0.5 packs of cigarettes smoked daily ARJ 12/31/19  Vaping Use   Vaping status: Never Used  Substance and Sexual Activity   Alcohol use: Not Currently    Alcohol/week: 0.0 standard drinks of alcohol    Comment: quit June 2012-used to drink 6 pack daily, occasional beer per pt 11/20/19   Drug use: No   Sexual activity: Yes    Birth control/protection: Post-menopausal  Other Topics Concern   Not on file   Social History Narrative   Not on file   Social Drivers of Health   Financial Resource Strain: High Risk (07/12/2023)   Overall Financial Resource Strain (CARDIA)    Difficulty of Paying Living Expenses: Hard  Food Insecurity: No Food Insecurity (10/15/2023)   Hunger Vital Sign    Worried About Running Out of Food in the Last Year: Never true    Ran Out of Food in the Last Year: Never true  Recent Concern: Food Insecurity - Food Insecurity Present (08/01/2023)   Hunger Vital Sign    Worried About Running Out of Food in the Last Year: Sometimes true    Ran Out of Food in the Last Year: Never true  Transportation Needs: No Transportation Needs (10/15/2023)   PRAPARE - Administrator, Civil Service (Medical): No    Lack of Transportation (Non-Medical): No  Physical Activity: Inactive (07/05/2023)   Exercise Vital Sign    Days of Exercise per Week: 0 days    Minutes of Exercise per Session: 0 min  Stress: No Stress Concern Present (10/06/2023)   [employer] of Occupational Health - Occupational Stress Questionnaire    Feeling of Stress: Only a little  Social Connections: Patient Declined (10/15/2023)   Social Connection and Isolation Panel    Frequency of Communication with Friends and Family: Patient declined    Frequency of Social Gatherings with Friends and Family: Patient declined    Attends Religious Services: Patient declined    Database administrator or Organizations: Patient declined    Attends Banker Meetings: Patient declined    Marital Status: Patient declined     Review of Systems   Gen: + fatigue and weight loss.  Denies fever, chills, anorexia. Denies weakness. CV: Denies chest pain, palpitations, syncope, peripheral edema, and claudication. Resp: Denies dyspnea at rest, cough, wheezing, coughing up blood, and pleurisy. GI: See HPI Derm: Denies rash,  itching, dry skin Psych: Denies depression, anxiety, memory loss, confusion. No  homicidal or suicidal ideation.  Heme: Denies bruising, bleeding, and enlarged lymph nodes.  Physical Exam   BP 134/78 (BP Location: Right Arm, Patient Position: Sitting, Cuff Size: Normal)   Pulse (!) 106   Temp 97.6 F (36.4 C) (Temporal)   Ht 5' 8 (1.727 m)   Wt 154 lb 12.8 oz (70.2 kg)   LMP  (LMP Unknown)   BMI 23.54 kg/m   General:   Alert and oriented. No distress noted. Pleasant and cooperative.  Head:  Normocephalic and atraumatic. Eyes:  Conjuctiva clear without scleral icterus. Mouth:  Oral mucosa pink and moist. Good dentition. No lesions. Lungs:  Clear to auscultation bilaterally. No wheezes, rales, or rhonchi. No distress.  Heart:  S1, S2 present without murmurs appreciated.  Abdomen:  +BS, soft, non-distended. Ttp to mid lower abdomen, umbilical region, and epigastrium No rebound or guarding. No HSM or masses noted. Rectal: deferred Msk:  Symmetrical without gross deformities. Normal posture. Extremities:  Without edema. Neurologic:  Alert and  oriented x4 Psych:  Alert and cooperative. Normal mood and affect.  Assessment & Plan pulses with  ARRIA NAIM is a 68 y.o. female presenting today with ongoing constipation and new weight loss.      Chronic constipation with unintentional weight loss and decreased appetite Chronic constipation with associated unintentional weight loss and decreased appetite. Constipation has worsened, with infrequent bowel movements requiring straining. Previous treatments with Miralax  and Benefiber were ineffective. Differential diagnosis includes malignancy, peptic ulcers, esophagitis, gastritis, duodenitis, polyps, constipation. No evidence of metastasis or new cancer on recent CT scan of the abdomen and pelvis on 9/27/225. Decreased appetite could be secondary to constipation, upper GI etiology, or psychosocial in nature.  - Schedule colonoscopy and upper endoscopy to evaluate for etiology of weight loss and decreased appetite.  -  Start Linzess 145 mcg daily with samples, adjust based on response. Send prescription if effective.  - Educate on taking Linzess on an empty stomach and potential initial diarrhea. - Continue PPI for appetite  Chronic Iron deficiency anemia Anemia possibly related to lung cancer or other hematological issues. Maintained on oral iron. Most recent Hgb 11.3. Potential for an ulcer contributing to anemia and decreased appetite. No overt GI bleeding reported.  - Evaluate for potential ulcer during upper endoscopy. - Continue daily iron supplements  Gastroesophageal reflux disease Gastroesophageal reflux disease managed with pantoprazole . - Continue pantoprazole  40 mg daily with additional dose as needed.   History of lung cancer, status post radiation, under surveillance Lung cancer treated with radiation approximately two years ago. Currently under surveillance with follow-up appointments every six weeks. No evidence of metastasis or new cancer on recent CT scan. She expresses concern about potential spread due to recent headaches, but no current evidence supports this. - Continue regular follow-up with oncology.  Hypertension Hypertension previously managed with hydrochlorothiazide , discontinued due to its effect on potassium levels. Awaiting new prescription for blood pressure management. Current blood pressure is well-controlled. - Follow up with primary care for new blood pressure medication.  History of hypokalemia and hypomagnesemia, resolved Previous episodes of hypokalemia and hypomagnesemia related to medication use, resolved after discontinuation of hydrochlorothiazide . - Continue current management until new blood pressure medication is started.       Proceed with upper endoscopy and colonoscopy with propofol  by Dr. Shaaron in near future: the risks, benefits, and alternatives have been discussed with the patient in detail. The patient states  understanding and desires to proceed. ASA  3  Linzess 145 mcg daily for 4 days prior  Hold iron for 1 week prior  Follow up   Follow up 3 months.     Charmaine Melia, MSN, FNP-BC, AGACNP-BC [facility]

## 2023-10-17 ENCOUNTER — Encounter: Payer: Self-pay | Admitting: Gastroenterology

## 2023-10-17 ENCOUNTER — Ambulatory Visit: Admitting: Gastroenterology

## 2023-10-17 ENCOUNTER — Telehealth: Payer: Self-pay | Admitting: *Deleted

## 2023-10-17 VITALS — BP 134/78 | HR 106 | Temp 97.6°F | Ht 68.0 in | Wt 154.8 lb

## 2023-10-17 DIAGNOSIS — R195 Other fecal abnormalities: Secondary | ICD-10-CM

## 2023-10-17 DIAGNOSIS — K5909 Other constipation: Secondary | ICD-10-CM

## 2023-10-17 DIAGNOSIS — R634 Abnormal weight loss: Secondary | ICD-10-CM

## 2023-10-17 DIAGNOSIS — D509 Iron deficiency anemia, unspecified: Secondary | ICD-10-CM

## 2023-10-17 DIAGNOSIS — I10 Essential (primary) hypertension: Secondary | ICD-10-CM

## 2023-10-17 DIAGNOSIS — R630 Anorexia: Secondary | ICD-10-CM | POA: Diagnosis not present

## 2023-10-17 DIAGNOSIS — K219 Gastro-esophageal reflux disease without esophagitis: Secondary | ICD-10-CM

## 2023-10-17 DIAGNOSIS — E876 Hypokalemia: Secondary | ICD-10-CM

## 2023-10-17 DIAGNOSIS — K5900 Constipation, unspecified: Secondary | ICD-10-CM

## 2023-10-17 MED ORDER — LINACLOTIDE 145 MCG PO CAPS: 145.0000 ug | ORAL_CAPSULE | Freq: Every day | ORAL | Status: AC

## 2023-10-17 NOTE — Telephone Encounter (Signed)
 LMOVM to call back to schedule TCS/EGD with Dr. Shaaron, ok rm 1, no iron x 1 week and ok to give suprep or clenpiq

## 2023-10-17 NOTE — Patient Instructions (Addendum)
 We will get you scheduled for upper endoscopy and colonoscopy in the near future with Dr. Shaaron to further evaluate your change in appetite, weight loss, and go ahead and update your surveillance colonoscopy given your history of colon polyps.  Please continue taking your pantoprazole  40 mg once daily.  We may need to consider briefly increasing this pending your endoscopy results.  Continue to monitor your weight at home if able.  Please let me know if you have greater than a 5 or 10 pound weight loss within the next 6-8 weeks.   I am giving you some samples of Linzess today.  You will take 1 capsule once daily at least 30 minutes prior to breakfast.  If this is too strong please let me know and we can decrease the dose.  If not effective enough we can give you a trial of a higher dose.  May have some form of  How to take Linzess: Once a day every day on empty stomach, at least 30 minutes before your first meal of the day. It is best to keep medications at a stable temperature Medication is best kept in its original bottle with the disket present.  It is a medication that is meant for everyday use and not to be used as needed.    What to expect: Constipation relief is typically felt in about 1 week Relief of abdominal pain, discomfort, and bloating begins in about 1 week with symptoms typically improving over 12 weeks and beyond. Diarrhea is most common side effect and typically begins within the first 2 weeks and can take 3-4 weeks to resolve It would be helpful to begin treatment over the weekend or when you can be closer to a bathroom   You can go to [email or web address] for patient support and sign up for daily medication reminders.    It was a pleasure to see you today. I want to create trusting relationships with patients. If you receive a survey regarding your visit,  I greatly appreciate you taking time to fill this out on paper or through your MyChart. I value your  feedback.  Charmaine Melia, MSN, FNP-BC, AGACNP-BC [facility]

## 2023-10-17 NOTE — Progress Notes (Signed)
 Medication Samples have been provided to the patient.  Drug name: linzess       Strength: 145 mcg        Qty: 3  LOT: [id number]  Exp.Date: 04/16/2024  Dosing instructions: one capsule daily 30 mins before breakfast  The patient has been instructed regarding the correct time, dose, and frequency of taking this medication, including desired effects and most common side effects.   Kelly Douglas Boom 9:54 AM 10/17/2023

## 2023-10-18 ENCOUNTER — Encounter: Admitting: Family Medicine

## 2023-10-23 ENCOUNTER — Other Ambulatory Visit: Payer: Self-pay | Admitting: *Deleted

## 2023-10-23 NOTE — Patient Instructions (Signed)
 Visit Information  Thank you for taking time to visit with me today. Please don't hesitate to contact me if I can be of assistance to you before our next scheduled appointment.  Our next appointment is no further scheduled appointments.   Please call the care guide team at [phone] if you need to cancel or reschedule your appointment.     Please call the Suicide and Crisis Lifeline: 988 call the [place]  National Suicide Prevention Lifeline: 770-884-4942 or TTY: 774-276-2101 TTY 385-650-1766) to talk to a trained counselor call 1-800-273-TALK (toll free, 24 hour hotline) if you are experiencing a Mental Health or Behavioral Health Crisis or need someone to talk to.  Patient verbalizes understanding of instructions and care plan provided today and agrees to view in MyChart. Active MyChart status and patient understanding of how to access instructions and care plan via MyChart confirmed with patient.     Rosina Forte, BSN RN [facility], [facility] RN Care Manager Direct Dial: [phone]  Fax: [phone]

## 2023-10-23 NOTE — Patient Outreach (Signed)
 Complex Care Management   Visit Note  10/23/2023  Name:  Kelly Douglas MRN: [id number] DOB: Jul 18, 1955  Situation: Referral received for Complex Care Management related to HTN I obtained verbal consent from Patient.  Visit completed with Patient  on the phone  Background:   Past Medical History:  Diagnosis Date   Anxiety    Arteriosclerotic cardiovascular disease (ASCVD) 2013   coronary calcification-left main, LAD and CX; small pericardial effusion   Arthritis    hips   Cancer (HCC)    Lung Cancer    Chronic back pain    Chronic bronchitis    Chronic lung disease    Chronic scarring and volume loss-left lung; characteristics of a chronic infectious process-possible MAI   Depression    Dyspnea    occasional    GERD (gastroesophageal reflux disease)    Dr Shaaron EGD 09/2009->esophagitis, sm HH, antral erosions, atonic esophagus   History of radiation therapy    Left and Right Lung- 11/24/21-12/07/21- Dr. Lynwood Nasuti   Hyperlipidemia    Hypertension    Hypothyroid 1981 approx   Scleroderma (HCC)    Seasonal allergies    Syncope    Multiple spells over the past 40+ years, likely neurocardiogenic   Tobacco abuse 06/24/2009    Assessment: Patient Reported Symptoms:  Cognitive Cognitive Status: No symptoms reported Cognitive/Intellectual Conditions Management [RPT]: None reported or documented in medical history or problem list   Health Maintenance Behaviors: Annual physical exam Healing Pattern: Average Health Facilitated by: Stress management, Rest  Neurological Neurological Review of Symptoms: No symptoms reported Neurological Management Strategies: Routine screening Neurological Self-Management Outcome: 4 (good)  HEENT HEENT Symptoms Reported: Tearing HEENT Management Strategies: Routine screening HEENT Self-Management Outcome: 4 (good)    Cardiovascular Cardiovascular Symptoms Reported: Fatigue Does patient have uncontrolled Hypertension?: No Cardiovascular  Management Strategies: Routine screening Cardiovascular Self-Management Outcome: 4 (good)  Respiratory Respiratory Symptoms Reported: No symptoms reported Respiratory Self-Management Outcome: 4 (good)  Endocrine Endocrine Symptoms Reported: No symptoms reported Is patient diabetic?: No Endocrine Self-Management Outcome: 4 (good)  Gastrointestinal Gastrointestinal Symptoms Reported: Constipation Gastrointestinal Management Strategies: Medication therapy Gastrointestinal Self-Management Outcome: 4 (good)    Genitourinary Genitourinary Symptoms Reported: No symptoms reported Genitourinary Self-Management Outcome: 4 (good)  Integumentary Integumentary Symptoms Reported: Skin changes Additional Integumentary Details: right foot is darker - after swelling / possibly arthritis per patient Skin Management Strategies: Routine screening Skin Self-Management Outcome: 4 (good)  Musculoskeletal Musculoskelatal Symptoms Reviewed: Difficulty walking Musculoskeletal Management Strategies: Coping strategies, Routine screening Musculoskeletal Self-Management Outcome: 3 (uncertain) Falls in the past year?: Yes Number of falls in past year: 2 or more Was there an injury with Fall?: No Fall Risk Category Calculator: 2 Patient Fall Risk Level: Moderate Fall Risk Patient at Risk for Falls Due to: History of fall(s), Impaired balance/gait Fall risk Follow up: Falls evaluation completed, Education provided  Psychosocial Psychosocial Symptoms Reported: No symptoms reported Behavioral Management Strategies: Support system Behavioral Health Self-Management Outcome: 4 (good) Major Change/Loss/Stressor/Fears (CP): Medical condition, family Techniques to [place] with Loss/Stress/Change: Diversional activities Quality of Family Relationships: helpful, supportive, involved Do you feel physically threatened by others?: No    10/23/2023    PHQ2-9 Depression Screening   Little interest or pleasure in doing things Not  at all  Feeling down, depressed, or hopeless Not at all  PHQ-2 - Total Score 0  Trouble falling or staying asleep, or sleeping too much    Feeling tired or having little energy    Poor appetite or overeating  Feeling bad about yourself - or that you are a failure or have let yourself or your family down    Trouble concentrating on things, such as reading the newspaper or watching television    Moving or speaking so slowly that other people could have noticed.  Or the opposite - being so fidgety or restless that you have been moving around a lot more than usual    Thoughts that you would be better off dead, or hurting yourself in some way    PHQ2-9 Total Score    If you checked off any problems, how difficult have these problems made it for you to do your work, take care of things at home, or get along with other people    Depression Interventions/Treatment      There were no vitals filed for this visit.  Medications Reviewed Today     Reviewed by Bertrum Rosina HERO, RN (Registered Nurse) on 10/23/23 at 1458  Med List Status: <None>   Medication Order Taking? Sig Documenting Provider Last Dose Status Informant  acetaminophen  (TYLENOL ) 650 MG CR tablet [id number] Yes Take 1,300 mg by mouth every 8 (eight) hours as needed for pain.  [provider]  Active Self           Med Note DORNA, MARIAN A   Wed Jul 05, 2023  4:13 PM)    albuterol  (VENTOLIN  HFA) 108 (90 Base) MCG/ACT inhaler [id number] Yes Inhale 1-2 puffs into the lungs every 6 (six) hours as needed for wheezing or shortness of breath. Antonetta Rollene BRAVO, MD  Active   amLODipine  (NORVASC ) 5 MG tablet [id number] Yes TAKE 1 TABLET EVERY DAY Antonetta Rollene BRAVO, MD  Active Self  atorvastatin  (LIPITOR) 10 MG tablet [id number] Yes TAKE 1 TABLET EVERY DAY Antonetta Rollene BRAVO, MD  Active Self  DULoxetine  (CYMBALTA ) 60 MG capsule [id number] Yes TAKE 1 CAPSULE EVERY DAY Antonetta Rollene BRAVO, MD  Active   ferrous sulfate  325 (65 FE)  MG EC tablet [id number] Yes Take 325 mg by mouth daily with breakfast. [provider]  Active Self  levothyroxine  (SYNTHROID ) 75 MCG tablet [id number] Yes TAKE 1 TABLET EVERY OTHER DAY ALTERNATING WITH 1/2 TABLET EVERY OTHER DAY Antonetta Rollene BRAVO, MD  Active   linaclotide [facility]) 145 MCG CAPS capsule [id number] Yes Take 1 capsule (145 mcg total) by mouth daily before breakfast. Kennedy Charmaine CROME, NP  Active   meloxicam  (MOBIC ) 7.5 MG tablet [id number] Yes TAKE 1 TABLET EVERY DAY Antonetta Rollene BRAVO, MD  Active   mirtazapine  (REMERON ) 7.5 MG tablet [id number] Yes TAKE 1 TABLET AT BEDTIME Tobie Suzzane POUR, MD  Active   montelukast  (SINGULAIR ) 10 MG tablet [id number] Yes TAKE 1 TABLET AT BEDTIME Antonetta Rollene BRAVO, MD  Active Self  Multiple Vitamin (MULTIVITAMIN WITH MINERALS) TABS tablet [id number] Yes Take 1 tablet by mouth daily. [provider]  Active Self  pantoprazole  (PROTONIX ) 40 MG tablet [id number] Yes TAKE 1 TABLET TWICE DAILY Antonetta Rollene BRAVO, MD  Active   potassium chloride  (KLOR-CON  M) 10 MEQ tablet [id number] Yes Take 1 tablet (10 mEq total) by mouth 2 (two) times daily. Antonetta Rollene BRAVO, MD  Active   Med List Note (Ward, Angelica, CPhT 06/12/23 1919): Send long term meds to [facility] (90 day supply) and then send short term meds to [facility]             Recommendation:   Continue Current Plan of Care  Follow Up Plan:   Closing From:  Complex  Care Management: RNCM - patient declined to enroll in [facility] at this time. She was very appreciative but states that she just has too much going on right now as she is her husbands primary caretaker. RNCM advised that if anything changes to reach out by phone or notify [facility] or Dr. Antonetta. Patient verbalized full understanding.  Rosina Forte, BSN RN [facility], [facility] RN Care Manager Direct Dial: [phone]  Fax: [phone]

## 2023-10-24 ENCOUNTER — Inpatient Hospital Stay: Admitting: Family Medicine

## 2023-10-26 ENCOUNTER — Encounter: Payer: Self-pay | Admitting: Internal Medicine

## 2023-10-26 ENCOUNTER — Ambulatory Visit: Admitting: Internal Medicine

## 2023-10-26 DIAGNOSIS — F419 Anxiety disorder, unspecified: Secondary | ICD-10-CM

## 2023-10-26 DIAGNOSIS — I10 Essential (primary) hypertension: Secondary | ICD-10-CM | POA: Diagnosis not present

## 2023-10-26 DIAGNOSIS — F32A Depression, unspecified: Secondary | ICD-10-CM | POA: Diagnosis not present

## 2023-10-26 DIAGNOSIS — Z09 Encounter for follow-up examination after completed treatment for conditions other than malignant neoplasm: Secondary | ICD-10-CM

## 2023-10-26 DIAGNOSIS — E876 Hypokalemia: Secondary | ICD-10-CM | POA: Diagnosis not present

## 2023-10-26 DIAGNOSIS — Z23 Encounter for immunization: Secondary | ICD-10-CM

## 2023-10-26 MED ORDER — MIRTAZAPINE 15 MG PO TABS: 15.0000 mg | ORAL_TABLET | Freq: Every day | ORAL | 1 refills | Status: AC

## 2023-10-26 MED ORDER — MAGNESIUM OXIDE 400 MG PO TABS: 400.0000 mg | ORAL_TABLET | Freq: Every day | ORAL | 1 refills | Status: DC

## 2023-10-26 NOTE — Assessment & Plan Note (Signed)
 BP Readings from Last 1 Encounters:  10/26/23 138/86   Well-controlled with amlodipine  5 g QD Would avoid diuretic for now due to severe hypokalemia and hypomagnesemia Counseled for compliance with the medications Advised DASH diet and moderate exercise/walking as tolerated

## 2023-10-26 NOTE — Assessment & Plan Note (Signed)
 K: 2.8 -> 3.0 during hospitalization On Klor-Con  10 mEq BID currently Advised to increase potassium rich food intake

## 2023-10-26 NOTE — Patient Instructions (Signed)
 Please start taking magnesium  oxide 400 mg once daily.  Please start taking Mirtazepine 15 mg at bedtime instead of 7.5 mg.  Please maintain at least 64 ounces of fluid intake.

## 2023-10-26 NOTE — Progress Notes (Addendum)
 Established Patient Office Visit  Subjective:  Patient ID: Kelly Douglas, female    DOB: [birth year]  Age: 68 y.o. MRN: [id number]  CC:  Chief Complaint  Patient presents with   Follow-up    Hospital f/u, reports doing better feels better.     HPI DAI APEL is a 68 y.o. female with past medical history of anxiety, arthritis, chronic pain syndrome, chronic bronchitis, hyperlipidemia, hypothyroid, hypertension and scleroderma who presents for follow-up after recent hospitalization.  She presented with 2 weeks of worsening generalized weakness and poor appetite. Progressive symptoms to the point where she has to hold on the wall in order to walk.  Patient was admitted with severe electrolyte abnormalities to include hypomagnesemia and hypokalemia in the setting of hydrochlorothiazide  use.  Her blood pressures were somewhat soft as well and therefore HCTZ was discontinued.  She was given IV and p.o. repletion of her electrolytes with significant improvement noted and she overall felt much better and her appetite was returning.  Today, she reports feeling better overall, but still reports lack of appetite.  Denies any other B symptoms, such as fever, chills, chronic cough, hemoptysis, night sweats, LAD, recent weight loss.  She has started taking potassium supplement.  BP was borderline elevated, but denies any headache, dizziness, chest pain or dyspnea currently.  Past Medical History:  Diagnosis Date   Anxiety    Arteriosclerotic cardiovascular disease (ASCVD) 2013   coronary calcification-left main, LAD and CX; small pericardial effusion   Arthritis    hips   Cancer (HCC)    Lung Cancer    Chronic back pain    Chronic bronchitis    Chronic lung disease    Chronic scarring and volume loss-left lung; characteristics of a chronic infectious process-possible MAI   Depression    Dyspnea    occasional    GERD (gastroesophageal reflux disease)    Dr Shaaron EGD  09/2009->esophagitis, sm HH, antral erosions, atonic esophagus   History of radiation therapy    Left and Right Lung- 11/24/21-12/07/21- Dr. Lynwood Nasuti   Hyperlipidemia    Hypertension    Hypothyroid 1981 approx   Scleroderma (HCC)    Seasonal allergies    Syncope    Multiple spells over the past 40+ years, likely neurocardiogenic   Tobacco abuse 06/24/2009    Past Surgical History:  Procedure Laterality Date   BILATERAL SALPINGOOPHORECTOMY  2013    Dr. Jayne; uterus remains in situ   BRONCHIAL BIOPSY  11/21/2019   Procedure: BRONCHIAL BIOPSIES;  Surgeon: Shelah Lamar RAMAN, MD;  Location[facility] ENDOSCOPY;  Service: Pulmonary;;   BRONCHIAL BRUSHINGS  11/21/2019   Procedure: BRONCHIAL BRUSHINGS;  Surgeon: Shelah Lamar RAMAN, MD;  Location: [facility] ENDOSCOPY;  Service: Pulmonary;;   BRONCHIAL NEEDLE ASPIRATION BIOPSY  11/21/2019   Procedure: BRONCHIAL NEEDLE ASPIRATION BIOPSIES;  Surgeon: Shelah Lamar RAMAN, MD;  Location: [facility] ENDOSCOPY;  Service: Pulmonary;;   BRONCHIAL WASHINGS  11/21/2019   Procedure: BRONCHIAL WASHINGS;  Surgeon: Shelah Lamar RAMAN, MD;  Location: [facility] ENDOSCOPY;  Service: Pulmonary;;   COLONOSCOPY  09/28/09   anal papilla otherwise normal   COLONOSCOPY N/A 08/29/2012   MFM:Rnonwpr polyp-removed as described above. tubular adenoma   COLONOSCOPY WITH PROPOFOL  N/A 11/11/2019   Procedure: COLONOSCOPY WITH PROPOFOL ;  Surgeon: Shaaron Lamar HERO, MD;  Location: [facility] ENDO SUITE;  Service: Endoscopy;  Laterality: N/A;  2:30pm   ESOPHAGOGASTRODUODENOSCOPY  10/07/09   Dr. Verneice of esophageal mucosa, diffusely ?esophagitis (bx benign), small HH/antral erosions, erythema bx  benign. 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 Hiatal hernia   FUDUCIAL PLACEMENT  10/18/2017   Procedure: PLACEMENT OF 3 FUDUCIAL INTO LEFT LOWER  LOBE OF LUNG-TARGET 1;  Surgeon: Shelah Lamar RAMAN, MD;  Location: [facility] OR;  Service: Thoracic;;   LAPAROSCOPIC APPENDECTOMY  07/16/2011   Procedure: APPENDECTOMY LAPAROSCOPIC;  Surgeon: Thresa JAYSON Pulling, MD;  Location: [facility] ORS;  Service: General;  Laterality: N/A;   POLYPECTOMY  11/11/2019   Procedure: POLYPECTOMY;  Surgeon: Shaaron Lamar HERO, MD;  Location: [facility] ENDO SUITE;  Service: Endoscopy;;   TUBAL LIGATION     VIDEO BRONCHOSCOPY  10/18/2017   VIDEO BRONCHOSCOPY WITH ENDOBRONCHIAL NAVIGATION N/A 10/18/2017   Procedure: VIDEO BRONCHOSCOPY WITH ENDOBRONCHIAL NAVIGATION;  Surgeon: Shelah Lamar RAMAN, MD;  Location: [facility] OR;  Service: Thoracic;  Laterality: N/A;   VIDEO BRONCHOSCOPY WITH ENDOBRONCHIAL NAVIGATION Left 11/21/2019   Procedure: VIDEO BRONCHOSCOPY WITH ENDOBRONCHIAL NAVIGATION;  Surgeon: Shelah Lamar RAMAN, MD;  Location: [facility] ENDOSCOPY;  Service: Pulmonary;  Laterality: Left;    Family History  Problem Relation Age of Onset   Aneurysm Mother    Stroke Father    Aneurysm Father    Hypertension Sister    Cancer Brother        throat cancer   Coronary artery disease Brother    Hypertension Brother    Down syndrome Brother    Hypertension Brother    Hypertension Brother        father/mother   Throat cancer Brother 49       throat cancer   Diabetes Brother    Diabetes Brother    Anesthesia problems Neg Hx    Hypotension Neg Hx    Malignant hyperthermia Neg Hx    Pseudochol deficiency Neg Hx    Colon cancer Neg Hx     Social History   Socioeconomic History   Marital status: Married    Spouse name: Not on file   Number of children: 2   Years of education: Not on file   Highest education level: Not on file  Occupational History   Occupation: disabled    Employer: NOT EMPLOYED  Tobacco Use   Smoking status: Every Day    Current packs/day: 0.50    Average packs/day: 0.5 packs/day for 52.3 years (26.1 ttl pk-yrs)    Types: Cigarettes    Start date: 07/14/1971   Smokeless tobacco:  Never   Tobacco comments:    0.5 packs of cigarettes smoked daily 10/26/23  Vaping Use   Vaping status: Never Used  Substance and Sexual Activity   Alcohol use: Not Currently    Alcohol/week: 0.0 standard drinks of alcohol    Comment: quit June 2012-used to drink 6 pack daily, occasional beer per pt 11/20/19   Drug use: No   Sexual activity: Yes    Birth control/protection: Post-menopausal  Other Topics Concern   Not on file  Social History Narrative   Not on file   Social Drivers of Health   Financial Resource Strain: High Risk (07/12/2023)   Overall Financial Resource Strain (CARDIA)    Difficulty of Paying Living Expenses: Hard  Food Insecurity: No Food Insecurity (10/23/2023)   Hunger Vital Sign    Worried About Running Out of Food in the Last Year: Never true    Ran Out of Food in the Last Year: Never true  Recent Concern: Food Insecurity - Food  Insecurity Present (08/01/2023)   Hunger Vital Sign    Worried About Running Out of Food in the Last Year: Sometimes true    Ran Out of Food in the Last Year: Never true  Transportation Needs: No Transportation Needs (10/23/2023)   PRAPARE - Administrator, Civil Service (Medical): No    Lack of Transportation (Non-Medical): No  Physical Activity: Inactive (07/05/2023)   Exercise Vital Sign    Days of Exercise per Week: 0 days    Minutes of Exercise per Session: 0 min  Stress: No Stress Concern Present (10/06/2023)   [employer] of Occupational Health - Occupational Stress Questionnaire    Feeling of Stress: Only a little  Social Connections: Patient Declined (10/15/2023)   Social Connection and Isolation Panel    Frequency of Communication with Friends and Family: Patient declined    Frequency of Social Gatherings with Friends and Family: Patient declined    Attends Religious Services: Patient declined    Database administrator or Organizations: Patient declined    Attends Banker Meetings: Patient  declined    Marital Status: Patient declined  Intimate Partner Violence: Not At Risk (10/23/2023)   Humiliation, Afraid, Rape, and Kick questionnaire    Fear of Current or Ex-Partner: No    Emotionally Abused: No    Physically Abused: No    Sexually Abused: No    Outpatient Medications Prior to Visit  Medication Sig Dispense Refill   acetaminophen  (TYLENOL ) 650 MG CR tablet Take 1,300 mg by mouth every 8 (eight) hours as needed for pain.      albuterol  (VENTOLIN  HFA) 108 (90 Base) MCG/ACT inhaler Inhale 1-2 puffs into the lungs every 6 (six) hours as needed for wheezing or shortness of breath. 54 g 3   amLODipine  (NORVASC ) 5 MG tablet TAKE 1 TABLET EVERY DAY 90 tablet 3   atorvastatin  (LIPITOR) 10 MG tablet TAKE 1 TABLET EVERY DAY 90 tablet 3   DULoxetine  (CYMBALTA ) 60 MG capsule TAKE 1 CAPSULE EVERY DAY 90 capsule 3   ferrous sulfate  325 (65 FE) MG EC tablet Take 325 mg by mouth daily with breakfast.     levothyroxine  (SYNTHROID ) 75 MCG tablet TAKE 1 TABLET EVERY OTHER DAY ALTERNATING WITH 1/2 TABLET EVERY OTHER DAY 66 tablet 3   linaclotide (LINZESS) 145 MCG CAPS capsule Take 1 capsule (145 mcg total) by mouth daily before breakfast.     meloxicam  (MOBIC ) 7.5 MG tablet TAKE 1 TABLET EVERY DAY 90 tablet 3   montelukast  (SINGULAIR ) 10 MG tablet TAKE 1 TABLET AT BEDTIME 90 tablet 3   Multiple Vitamin (MULTIVITAMIN WITH MINERALS) TABS tablet Take 1 tablet by mouth daily.     pantoprazole  (PROTONIX ) 40 MG tablet TAKE 1 TABLET TWICE DAILY 180 tablet 3   potassium chloride  (KLOR-CON  M) 10 MEQ tablet Take 1 tablet (10 mEq total) by mouth 2 (two) times daily. 180 tablet 2   mirtazapine  (REMERON ) 7.5 MG tablet TAKE 1 TABLET AT BEDTIME 90 tablet 3   No facility-administered medications prior to visit.    Allergies  Allergen Reactions   Aspirin  Other (See Comments)    Nose bleeds    ROS Review of Systems  Constitutional:  Positive for appetite change. Negative for chills and fever.  HENT:   Negative for congestion and sore throat.   Eyes:  Negative for pain and discharge.  Respiratory:  Negative for cough and shortness of breath.   Cardiovascular:  Negative for chest pain and  palpitations.  Gastrointestinal:  Negative for abdominal pain, diarrhea, nausea and vomiting.  Endocrine: Negative for polydipsia and polyuria.  Genitourinary:  Negative for dysuria and hematuria.  Musculoskeletal:  Negative for neck pain and neck stiffness.  Skin:  Negative for rash.  Neurological:  Negative for dizziness and weakness.       Tingling sensation in bilateral lower extremities  Psychiatric/Behavioral:  Positive for sleep disturbance. Negative for agitation and behavioral problems.       Objective:    Physical Exam Vitals reviewed.  Constitutional:      General: She is not in acute distress.    Appearance: She is not diaphoretic.  HENT:     Head: Normocephalic and atraumatic.     Nose: Nose normal. No congestion.     Mouth/Throat:     Mouth: Mucous membranes are moist.     Pharynx: No posterior oropharyngeal erythema.  Eyes:     General: No scleral icterus.    Extraocular Movements: Extraocular movements intact.  Cardiovascular:     Rate and Rhythm: Normal rate and regular rhythm.     Heart sounds: Normal heart sounds. No murmur heard. Pulmonary:     Breath sounds: Normal breath sounds. No wheezing or rales.  Musculoskeletal:     Cervical back: Neck supple. No tenderness.     Right lower leg: No edema.     Left lower leg: No edema.  Skin:    General: Skin is warm.     Findings: No rash.  Neurological:     General: No focal deficit present.     Mental Status: She is alert and oriented to person, place, and time.  Psychiatric:        Mood and Affect: Mood normal.        Behavior: Behavior normal.     BP 138/86 (BP Location: Left Arm)   Pulse 60   Ht 5' 8 (1.727 m)   Wt 153 lb 6.4 oz (69.6 kg)   LMP  (LMP Unknown)   SpO2 90%   BMI 23.32 kg/m  Wt Readings from  Last 3 Encounters:  10/26/23 153 lb 6.4 oz (69.6 kg)  10/17/23 154 lb 12.8 oz (70.2 kg)  10/15/23 148 lb 14.4 oz (67.5 kg)    Lab Results  Component Value Date   TSH 2.370 07/18/2023   Lab Results  Component Value Date   WBC 9.9 10/15/2023   HGB 11.6 (L) 10/15/2023   HCT 35.7 (L) 10/15/2023   MCV 80.4 10/15/2023   PLT 310 10/15/2023   Lab Results  Component Value Date   NA 139 10/15/2023   K 3.0 (L) 10/15/2023   CO2 26 10/15/2023   GLUCOSE 127 (H) 10/15/2023   BUN 7 (L) 10/15/2023   CREATININE 0.92 10/15/2023   BILITOT 1.1 10/14/2023   ALKPHOS 65 10/14/2023   AST 24 10/14/2023   ALT 23 10/14/2023   PROT 6.8 10/14/2023   ALBUMIN 3.8 10/14/2023   CALCIUM  7.8 (L) 10/15/2023   ANIONGAP 10 10/15/2023   EGFR 63 07/18/2023   GFR 74.36 11/04/2019   Lab Results  Component Value Date   CHOL 121 07/18/2023   Lab Results  Component Value Date   HDL 34 (L) 07/18/2023   Lab Results  Component Value Date   LDLCALC 51 07/18/2023   Lab Results  Component Value Date   TRIG 221 (H) 07/18/2023   Lab Results  Component Value Date   CHOLHDL 3.6 07/18/2023   Lab Results  Component  Value Date   HGBA1C 5.1 10/27/2015      Assessment & Plan:   Problem List Items Addressed This Visit       Cardiovascular and Mediastinum   HTN (hypertension)   BP Readings from Last 1 Encounters:  10/26/23 138/86   Well-controlled with amlodipine  5 g QD Would avoid diuretic for now due to severe hypokalemia and hypomagnesemia Counseled for compliance with the medications Advised DASH diet and moderate exercise/walking as tolerated      Relevant Orders   Magnesium    Basic Metabolic Panel (BMET)     Other   Anxiety and depression   Still reports insomnia and anxiety On mirtazapine  7.5 mg nightly Increased dose of mirtazapine  to 15 mg nightly to improve insomnia and appetite      Relevant Medications   mirtazapine  (REMERON ) 15 MG tablet   Hypokalemia   K: 2.8 -> 3.0 during  hospitalization On Klor-Con  10 mEq BID currently Advised to increase potassium rich food intake      Relevant Orders   Magnesium    Basic Metabolic Panel (BMET)   Hypomagnesemia - Primary   Was deemed to be due to diuretic use HCTZ has been discontinued now Advised to maintain adequate hydration and eat at regular intervals Tingling sensation in bilateral lower extremities and muscle cramps could be related to hypomagnesemia Started magnesium  supplement 400 mg once daily Check BMP and magnesium       Relevant Medications   magnesium  oxide (MAG-OX) 400 MG tablet   Other Relevant Orders   Magnesium    Basic Metabolic Panel (BMET)   Hospital discharge follow-up   Hospital chart reviewed, including discharge summary Medications reconciled and reviewed with the patient in detail      Relevant Orders   Magnesium    Basic Metabolic Panel (BMET)    Meds ordered this encounter  Medications   mirtazapine  (REMERON ) 15 MG tablet    Sig: Take 1 tablet (15 mg total) by mouth at bedtime.    Dispense:  90 tablet    Refill:  1    Dose change - 10/26/23   magnesium  oxide (MAG-OX) 400 MG tablet    Sig: Take 1 tablet (400 mg total) by mouth daily.    Dispense:  90 tablet    Refill:  1    Follow-up: Return if symptoms worsen or fail to improve.    Suzzane MARLA Blanch, MD

## 2023-10-26 NOTE — Assessment & Plan Note (Addendum)
 Was deemed to be due to diuretic use HCTZ has been discontinued now Advised to maintain adequate hydration and eat at regular intervals Tingling sensation in bilateral lower extremities and muscle cramps could be related to hypomagnesemia Started magnesium  supplement 400 mg once daily Check BMP and magnesium 

## 2023-10-26 NOTE — Assessment & Plan Note (Signed)
 Still reports insomnia and anxiety On mirtazapine  7.5 mg nightly Increased dose of mirtazapine  to 15 mg nightly to improve insomnia and appetite

## 2023-10-26 NOTE — Assessment & Plan Note (Signed)
 Hospital chart reviewed, including discharge summary Medications reconciled and reviewed with the patient in detail

## 2023-10-27 ENCOUNTER — Other Ambulatory Visit: Payer: Self-pay | Admitting: Internal Medicine

## 2023-10-27 ENCOUNTER — Ambulatory Visit: Payer: Self-pay | Admitting: Internal Medicine

## 2023-10-27 DIAGNOSIS — I10 Essential (primary) hypertension: Secondary | ICD-10-CM | POA: Diagnosis not present

## 2023-10-27 DIAGNOSIS — M160 Bilateral primary osteoarthritis of hip: Secondary | ICD-10-CM | POA: Diagnosis not present

## 2023-10-27 DIAGNOSIS — I2510 Atherosclerotic heart disease of native coronary artery without angina pectoris: Secondary | ICD-10-CM | POA: Diagnosis not present

## 2023-10-27 DIAGNOSIS — E860 Dehydration: Secondary | ICD-10-CM | POA: Diagnosis not present

## 2023-10-27 DIAGNOSIS — J4489 Other specified chronic obstructive pulmonary disease: Secondary | ICD-10-CM | POA: Diagnosis not present

## 2023-10-27 DIAGNOSIS — E876 Hypokalemia: Secondary | ICD-10-CM | POA: Diagnosis not present

## 2023-10-27 DIAGNOSIS — E559 Vitamin D deficiency, unspecified: Secondary | ICD-10-CM | POA: Diagnosis not present

## 2023-10-27 DIAGNOSIS — M349 Systemic sclerosis, unspecified: Secondary | ICD-10-CM | POA: Diagnosis not present

## 2023-10-27 DIAGNOSIS — T502X5D Adverse effect of carbonic-anhydrase inhibitors, benzothiadiazides and other diuretics, subsequent encounter: Secondary | ICD-10-CM | POA: Diagnosis not present

## 2023-10-27 LAB — BASIC METABOLIC PANEL WITH GFR
BUN/Creatinine Ratio: 5 — ABNORMAL LOW (ref 12–28)
BUN: 5 mg/dL — ABNORMAL LOW (ref 8–27)
CO2: 24 mmol/L (ref 20–29)
Calcium: 9.8 mg/dL (ref 8.7–10.3)
Chloride: 102 mmol/L (ref 96–106)
Creatinine, Ser: 0.93 mg/dL (ref 0.57–1.00)
Glucose: 104 mg/dL — ABNORMAL HIGH (ref 70–99)
Potassium: 4.2 mmol/L (ref 3.5–5.2)
Sodium: 142 mmol/L (ref 134–144)
eGFR: 67 mL/min/1.73 (ref >59)

## 2023-10-27 LAB — MAGNESIUM: Magnesium: 0.3 mg/dL — CL (ref 1.6–2.3)

## 2023-10-28 ENCOUNTER — Other Ambulatory Visit: Payer: Self-pay | Admitting: Family Medicine

## 2023-10-28 DIAGNOSIS — E782 Mixed hyperlipidemia: Secondary | ICD-10-CM

## 2023-10-31 NOTE — Telephone Encounter (Signed)
 LMOVM to call back

## 2023-11-02 NOTE — Telephone Encounter (Signed)
 Letter mailed

## 2023-11-03 ENCOUNTER — Telehealth: Payer: Self-pay | Admitting: Licensed Clinical Social Worker

## 2023-11-03 ENCOUNTER — Encounter: Payer: Self-pay | Admitting: Licensed Clinical Social Worker

## 2023-11-03 DIAGNOSIS — E876 Hypokalemia: Secondary | ICD-10-CM | POA: Diagnosis not present

## 2023-11-03 NOTE — Patient Instructions (Signed)
 Katelynd P Repetto - I am sorry I was unable to reach you today for our scheduled appointment. I work with Antonetta Rollene BRAVO, MD and am calling to support your healthcare needs. Please contact me at [phone] at your earliest convenience. I look forward to speaking with you soon.   Thank you,  Lyle Rung, BSW, MSW, LCSW Licensed Clinical Social Worker [employer]   [facility] [email or web address] Direct Dial: [phone]

## 2023-11-04 LAB — BASIC METABOLIC PANEL WITH GFR
BUN/Creatinine Ratio: 5 — ABNORMAL LOW (ref 12–28)
BUN: 5 mg/dL — ABNORMAL LOW (ref 8–27)
CO2: 26 mmol/L (ref 20–29)
Calcium: 8.9 mg/dL (ref 8.7–10.3)
Chloride: 102 mmol/L (ref 96–106)
Creatinine, Ser: 0.96 mg/dL (ref 0.57–1.00)
Glucose: 95 mg/dL (ref 70–99)
Potassium: 3.8 mmol/L (ref 3.5–5.2)
Sodium: 143 mmol/L (ref 134–144)
eGFR: 64 mL/min/1.73 (ref >59)

## 2023-11-04 LAB — MAGNESIUM: Magnesium: 0.7 mg/dL — CL (ref 1.6–2.3)

## 2023-11-05 ENCOUNTER — Ambulatory Visit: Payer: Self-pay | Admitting: Internal Medicine

## 2023-11-05 ENCOUNTER — Other Ambulatory Visit: Payer: Self-pay | Admitting: Internal Medicine

## 2023-11-08 ENCOUNTER — Other Ambulatory Visit: Payer: Self-pay | Admitting: Licensed Clinical Social Worker

## 2023-11-08 NOTE — Patient Outreach (Addendum)
 Complex Care Management   Visit Note  11/08/2023  Name:  Kelly Douglas MRN: [id number] DOB: 1955/12/20  Situation: Referral received for Complex Care Management related to Mental/Behavioral Health diagnosis depression/anxiety. I obtained verbal consent from Patient.  Visit completed with Patient  on the phone  Background:   Past Medical History:  Diagnosis Date   Anxiety    Arteriosclerotic cardiovascular disease (ASCVD) 2013   coronary calcification-left main, LAD and CX; small pericardial effusion   Arthritis    hips   Cancer (HCC)    Lung Cancer    Chronic back pain    Chronic bronchitis    Chronic lung disease    Chronic scarring and volume loss-left lung; characteristics of a chronic infectious process-possible MAI   Depression    Dyspnea    occasional    GERD (gastroesophageal reflux disease)    Dr Shaaron EGD 09/2009->esophagitis, sm HH, antral erosions, atonic esophagus   History of radiation therapy    Left and Right Lung- 11/24/21-12/07/21- Dr. Lynwood Nasuti   Hyperlipidemia    Hypertension    Hypothyroid 1981 approx   Scleroderma (HCC)    Seasonal allergies    Syncope    Multiple spells over the past 40+ years, likely neurocardiogenic   Tobacco abuse 06/24/2009    Assessment: Patient Reported Symptoms:  Cognitive Cognitive Status: No symptoms reported Cognitive/Intellectual Conditions Management [RPT]: None reported or documented in medical history or problem list   Health Maintenance Behaviors: Annual physical exam Healing Pattern: Average Health Facilitated by: Rest, Stress management  Neurological Neurological Review of Symptoms: No symptoms reported Neurological Management Strategies: Routine screening Neurological Self-Management Outcome: 4 (good)  HEENT HEENT Symptoms Reported: Tearing HEENT Management Strategies: Routine screening HEENT Self-Management Outcome: 4 (good)    Cardiovascular Cardiovascular Symptoms Reported: Fatigue Does patient  have uncontrolled Hypertension?: No Cardiovascular Management Strategies: Routine screening Cardiovascular Self-Management Outcome: 4 (good)  Respiratory Respiratory Symptoms Reported: No symptoms reported Respiratory Management Strategies: Routine screening Respiratory Self-Management Outcome: 4 (good)  Endocrine Endocrine Symptoms Reported: No symptoms reported Is patient diabetic?: No Endocrine Self-Management Outcome: 4 (good)  Gastrointestinal Gastrointestinal Symptoms Reported: Constipation Gastrointestinal Management Strategies: Medication therapy Gastrointestinal Self-Management Outcome: 4 (good)    Genitourinary Genitourinary Symptoms Reported: No symptoms reported Genitourinary Management Strategies: Adequate rest, Coping strategies Genitourinary Self-Management Outcome: 4 (good)  Integumentary Integumentary Symptoms Reported: Skin changes Skin Management Strategies: Routine screening Skin Self-Management Outcome: 4 (good)  Musculoskeletal Musculoskelatal Symptoms Reviewed: Difficulty walking Musculoskeletal Management Strategies: Coping strategies, Routine screening Musculoskeletal Self-Management Outcome: 3 (uncertain)      Psychosocial Psychosocial Symptoms Reported: No symptoms reported Behavioral Management Strategies: Support system, Medication therapy Behavioral Health Self-Management Outcome: 4 (good) Major Change/Loss/Stressor/Fears (CP): Medical condition, family, Medical condition, self Techniques to Cope with Loss/Stress/Change: Diversional activities Quality of Family Relationships: helpful, involved, supportive Do you feel physically threatened by others?: No    11/08/2023    PHQ2-9 Depression Screening   Little interest or pleasure in doing things Not at all  Feeling down, depressed, or hopeless Not at all  PHQ-2 - Total Score 0  Trouble falling or staying asleep, or sleeping too much More than half the days  Feeling tired or having little energy  Several days  Poor appetite or overeating  Not at all  Feeling bad about yourself - or that you are a failure or have let yourself or your family down Not at all  Trouble concentrating on things, such as reading the newspaper or watching television Not at all  Moving or speaking so slowly that other people could have noticed.  Or the opposite - being so fidgety or restless that you have been moving around a lot more than usual Not at all  Thoughts that you would be better off dead, or hurting yourself in some way Not at all  PHQ2-9 Total Score 3  If you checked off any problems, how difficult have these problems made it for you to do your work, take care of things at home, or get along with other people Not difficult at all  Depression Interventions/Treatment        11/08/2023   10:39 AM 10/26/2023    1:58 PM 07/18/2023    8:04 AM 07/12/2023   10:02 AM  GAD 7 : Generalized Anxiety Score  Nervous, Anxious, on Edge 0 0 1 0  Control/stop worrying 0 0 0 0  Worry too much - different things 0 0 0 0  Trouble relaxing 0 0 0 1  Restless 0 0 0 0  Easily annoyed or irritable 0 0 0 0  Afraid - awful might happen 0 0 0 0  Total GAD 7 Score 0 0 1 1  Anxiety Difficulty Not difficult at all  Not difficult at all Not difficult at all    SDOH Interventions    Flowsheet Row Patient Outreach from 11/08/2023 in [place] POPULATION HEALTH DEPARTMENT Patient Outreach Telephone from 10/23/2023 in [place] POPULATION HEALTH DEPARTMENT Patient Outreach Telephone from 10/06/2023 in [place] POPULATION HEALTH DEPARTMENT Patient Outreach Telephone from 08/01/2023 in [place] POPULATION HEALTH DEPARTMENT Patient Outreach Telephone from 07/12/2023 in  POPULATION HEALTH DEPARTMENT Clinical Support from 07/05/2023 in [facility]  SDOH Interventions        Food Insecurity Interventions -- Intervention Not Indicated -- [employer] Provided  [Pt confirrmed receiving food  resources successfully] Community Resources Provided  [Pt does not qualify for food stamps. She was educated on other food resources and pantries in her nearby area] AMB Referral  Housing Interventions -- Intervention Not Indicated -- Intervention Not Indicated Intervention Not Indicated Intervention Not Indicated  Transportation Interventions Intervention Not Indicated Intervention Not Indicated, Payor Benefit -- Community Resources Provided [employer] Provided  [Pt does not drive and has to have friends take her to the grocery store. Transportation resources provided by mail] Intervention Not Indicated  Utilities Interventions -- Intervention Not Indicated -- -- Intervention Not Indicated Intervention Not Indicated  Alcohol Usage Interventions -- -- -- -- -- Intervention Not Indicated (Score <7)  Financial Strain Interventions -- -- -- -- Programmer, applications Provided Other (Comment)  [CRR placed]  Physical Activity Interventions -- -- -- -- -- Other (Comments)  [current cancer diagnosis and chronic pain]  Stress Interventions Programmer, applications Provided, Provide Counseling -- [employer] Provided, Education officer, environmental Provided, Education officer, environmental Provided  [Pt denied behaivoral health treatment but was receptive to financial assistance resources to help alleviate her stress] Intervention Not Indicated  Social Connections Interventions -- -- -- -- Intervention Not Indicated Intervention Not Indicated  Health Literacy Interventions -- -- -- -- -- Intervention Not Indicated    There were no vitals filed for this visit.  Medications Reviewed Today     Reviewed by Merlynn Lyle CROME, LCSW (Social Worker) on 11/08/23 at 1018  Med List Status: <None>   Medication Order Taking? Sig Documenting Provider Last Dose Status Informant  acetaminophen  (TYLENOL ) 650 MG CR tablet [id number]  Take 1,300 mg by mouth every  8 (eight) hours as needed for pain.   [provider]  Active Self           Med Note DORNA, MARIAN A   Wed Jul 05, 2023  4:13 PM)    albuterol  (VENTOLIN  HFA) [phone] Base) MCG/ACT inhaler [id number]  Inhale 1-2 puffs into the lungs every 6 (six) hours as needed for wheezing or shortness of breath. Antonetta Rollene BRAVO, MD  Active   amLODipine  (NORVASC ) 5 MG tablet [id number]  TAKE 1 TABLET EVERY DAY Antonetta Rollene BRAVO, MD  Active Self  atorvastatin  (LIPITOR) 10 MG tablet [id number]  TAKE 1 TABLET EVERY DAY Antonetta Rollene BRAVO, MD  Active   DULoxetine  (CYMBALTA ) 60 MG capsule [id number]  TAKE 1 CAPSULE EVERY DAY Antonetta Rollene BRAVO, MD  Active   ferrous sulfate  325 (65 FE) MG EC tablet [id number]  Take 325 mg by mouth daily with breakfast. [provider]  Active Self  levothyroxine  (SYNTHROID ) 75 MCG tablet [id number]  TAKE 1 TABLET EVERY OTHER DAY ALTERNATING WITH 1/2 TABLET EVERY OTHER DAY Antonetta Rollene BRAVO, MD  Active   linaclotide [facility]) 145 MCG CAPS capsule [id number]  Take 1 capsule (145 mcg total) by mouth daily before breakfast. Kennedy Charmaine CROME, NP  Active   magnesium  oxide (MAG-OX) 400 MG tablet [id number]  Take 1 tablet (400 mg total) by mouth daily. Tobie Suzzane POUR, MD  Active   meloxicam  (MOBIC ) 7.5 MG tablet [id number]  TAKE 1 TABLET EVERY DAY Antonetta Rollene BRAVO, MD  Active   mirtazapine  (REMERON ) 15 MG tablet [id number]  Take 1 tablet (15 mg total) by mouth at bedtime. Tobie Suzzane POUR, MD  Active   montelukast  (SINGULAIR ) 10 MG tablet [id number]  TAKE 1 TABLET AT BEDTIME Antonetta Rollene BRAVO, MD  Active Self  Multiple Vitamin (MULTIVITAMIN WITH MINERALS) TABS tablet [id number]  Take 1 tablet by mouth daily. [provider]  Active Self  pantoprazole  (PROTONIX ) 40 MG tablet [id number]  TAKE 1 TABLET TWICE DAILY Antonetta Rollene BRAVO, MD  Active   potassium chloride  (KLOR-CON  M) 10 MEQ tablet [id number]  Take 1 tablet (10 mEq total) by mouth 2 (two) times daily. Antonetta Rollene BRAVO, MD  Active   Med  List Note (Ward, Angelica, CPhT 06/12/23 1919): Send long term meds to [facility] (90 day supply) and then send short term meds to [facility]             Recommendation:   PCP Follow-up Continue Current Plan of Care  Follow Up Plan:   Telephone follow-up in 1 month  Lyle Rung, BSW, MSW, LCSW Licensed Clinical Social Worker [employer]   [email or web address] Direct Dial: [phone]

## 2023-11-08 NOTE — Patient Instructions (Signed)
 Visit Information  Thank you for taking time to visit with me today. Please don't hesitate to contact me if I can be of assistance to you before our next scheduled appointment.  Your next care management appointment is by telephone on 12/04/23 at 9 am Please call the care guide team at [phone] if you need to cancel, schedule, or reschedule an appointment.   Please call the Suicide and Crisis Lifeline: 988 call the [place]  National Suicide Prevention Lifeline: 5300967763 or TTY: (352) 345-8049 TTY (509)147-4964) to talk to a trained counselor call 1-800-273-TALK (toll free, 24 hour hotline) go to [facility] [address], [place] [phone]) call the [facility] Crisis Line: [phone] call 911 if you are experiencing a Mental Health or Behavioral Health Crisis or need someone to talk to.  Lyle Rung, BSW, MSW, LCSW Licensed Clinical Social Worker [employer]   [facility] [email or web address] Direct Dial: [phone]

## 2023-11-13 DIAGNOSIS — M349 Systemic sclerosis, unspecified: Secondary | ICD-10-CM | POA: Diagnosis not present

## 2023-11-13 DIAGNOSIS — E876 Hypokalemia: Secondary | ICD-10-CM | POA: Diagnosis not present

## 2023-11-13 DIAGNOSIS — J4489 Other specified chronic obstructive pulmonary disease: Secondary | ICD-10-CM | POA: Diagnosis not present

## 2023-11-13 DIAGNOSIS — I10 Essential (primary) hypertension: Secondary | ICD-10-CM | POA: Diagnosis not present

## 2023-11-13 DIAGNOSIS — I2510 Atherosclerotic heart disease of native coronary artery without angina pectoris: Secondary | ICD-10-CM | POA: Diagnosis not present

## 2023-11-13 DIAGNOSIS — E559 Vitamin D deficiency, unspecified: Secondary | ICD-10-CM | POA: Diagnosis not present

## 2023-11-13 DIAGNOSIS — M160 Bilateral primary osteoarthritis of hip: Secondary | ICD-10-CM | POA: Diagnosis not present

## 2023-11-13 DIAGNOSIS — T502X5D Adverse effect of carbonic-anhydrase inhibitors, benzothiadiazides and other diuretics, subsequent encounter: Secondary | ICD-10-CM | POA: Diagnosis not present

## 2023-11-20 DIAGNOSIS — J4489 Other specified chronic obstructive pulmonary disease: Secondary | ICD-10-CM | POA: Diagnosis not present

## 2023-11-20 DIAGNOSIS — M349 Systemic sclerosis, unspecified: Secondary | ICD-10-CM | POA: Diagnosis not present

## 2023-11-20 DIAGNOSIS — M160 Bilateral primary osteoarthritis of hip: Secondary | ICD-10-CM | POA: Diagnosis not present

## 2023-11-20 DIAGNOSIS — T502X5D Adverse effect of carbonic-anhydrase inhibitors, benzothiadiazides and other diuretics, subsequent encounter: Secondary | ICD-10-CM | POA: Diagnosis not present

## 2023-11-20 DIAGNOSIS — E876 Hypokalemia: Secondary | ICD-10-CM | POA: Diagnosis not present

## 2023-11-20 DIAGNOSIS — E860 Dehydration: Secondary | ICD-10-CM | POA: Diagnosis not present

## 2023-11-20 DIAGNOSIS — I2510 Atherosclerotic heart disease of native coronary artery without angina pectoris: Secondary | ICD-10-CM | POA: Diagnosis not present

## 2023-11-20 DIAGNOSIS — I10 Essential (primary) hypertension: Secondary | ICD-10-CM | POA: Diagnosis not present

## 2023-11-20 DIAGNOSIS — E559 Vitamin D deficiency, unspecified: Secondary | ICD-10-CM | POA: Diagnosis not present

## 2023-11-22 ENCOUNTER — Encounter: Payer: Self-pay | Admitting: *Deleted

## 2023-11-22 ENCOUNTER — Other Ambulatory Visit: Payer: Self-pay | Admitting: *Deleted

## 2023-11-22 MED ORDER — NA SULFATE-K SULFATE-MG SULF 17.5-3.13-1.6 GM/177ML PO SOLN: ORAL | 0 refills | Status: DC

## 2023-11-22 NOTE — Telephone Encounter (Signed)
 Pt has been scheduled for 11/30/23. Informed pt to start holding iron tomorrow 11/23/23. Instructions printed and placed up front for pt to pick up and prep sent to pharmacy.

## 2023-11-23 NOTE — Telephone Encounter (Signed)
 PA approved via cohere for EGD Authorization #[id number] DOS:  11/30/2023 - 01/17/2024

## 2023-11-30 ENCOUNTER — Ambulatory Visit (HOSPITAL_COMMUNITY)
Admission: RE | Admit: 2023-11-30 | Discharge: 2023-11-30 | Disposition: A | Discharge status: Home / Self Care | Attending: Internal Medicine | Admitting: Internal Medicine

## 2023-11-30 ENCOUNTER — Ambulatory Visit (HOSPITAL_COMMUNITY): Admitting: Anesthesiology

## 2023-11-30 ENCOUNTER — Encounter (HOSPITAL_COMMUNITY): Payer: Self-pay | Admitting: Internal Medicine

## 2023-11-30 ENCOUNTER — Encounter (HOSPITAL_COMMUNITY)
Admission: RE | Disposition: A | Discharge status: Home / Self Care | Payer: Self-pay | Source: Home / Self Care | Attending: Internal Medicine

## 2023-11-30 ENCOUNTER — Other Ambulatory Visit: Payer: Self-pay

## 2023-11-30 DIAGNOSIS — K3189 Other diseases of stomach and duodenum: Secondary | ICD-10-CM

## 2023-11-30 DIAGNOSIS — R634 Abnormal weight loss: Secondary | ICD-10-CM | POA: Diagnosis not present

## 2023-11-30 DIAGNOSIS — R630 Anorexia: Secondary | ICD-10-CM

## 2023-11-30 DIAGNOSIS — F17210 Nicotine dependence, cigarettes, uncomplicated: Secondary | ICD-10-CM | POA: Diagnosis not present

## 2023-11-30 DIAGNOSIS — K2210 Ulcer of esophagus without bleeding: Secondary | ICD-10-CM | POA: Diagnosis not present

## 2023-11-30 DIAGNOSIS — Z1211 Encounter for screening for malignant neoplasm of colon: Secondary | ICD-10-CM | POA: Insufficient documentation

## 2023-11-30 DIAGNOSIS — I10 Essential (primary) hypertension: Secondary | ICD-10-CM | POA: Insufficient documentation

## 2023-11-30 DIAGNOSIS — Z860101 Personal history of adenomatous and serrated colon polyps: Secondary | ICD-10-CM | POA: Diagnosis not present

## 2023-11-30 DIAGNOSIS — Z6823 Body mass index (BMI) 23.0-23.9, adult: Secondary | ICD-10-CM | POA: Diagnosis not present

## 2023-11-30 DIAGNOSIS — K219 Gastro-esophageal reflux disease without esophagitis: Secondary | ICD-10-CM | POA: Diagnosis not present

## 2023-11-30 DIAGNOSIS — D509 Iron deficiency anemia, unspecified: Secondary | ICD-10-CM | POA: Diagnosis not present

## 2023-11-30 DIAGNOSIS — K5730 Diverticulosis of large intestine without perforation or abscess without bleeding: Secondary | ICD-10-CM | POA: Diagnosis not present

## 2023-11-30 DIAGNOSIS — K449 Diaphragmatic hernia without obstruction or gangrene: Secondary | ICD-10-CM

## 2023-11-30 DIAGNOSIS — Q438 Other specified congenital malformations of intestine: Secondary | ICD-10-CM | POA: Diagnosis not present

## 2023-11-30 DIAGNOSIS — D630 Anemia in neoplastic disease: Secondary | ICD-10-CM | POA: Diagnosis not present

## 2023-11-30 DIAGNOSIS — J449 Chronic obstructive pulmonary disease, unspecified: Secondary | ICD-10-CM | POA: Diagnosis not present

## 2023-11-30 DIAGNOSIS — Z860100 Personal history of colon polyps, unspecified: Secondary | ICD-10-CM | POA: Diagnosis not present

## 2023-11-30 DIAGNOSIS — R6889 Other general symptoms and signs: Secondary | ICD-10-CM | POA: Diagnosis not present

## 2023-11-30 HISTORY — PX: COLONOSCOPY: SHX5424

## 2023-11-30 HISTORY — PX: ESOPHAGOGASTRODUODENOSCOPY: SHX5428

## 2023-11-30 MED ORDER — PROPOFOL 500 MG/50ML IV EMUL
INTRAVENOUS | Status: DC | PRN
  Administered 2023-11-30: 150 ug/kg/min via INTRAVENOUS
  Administered 2023-11-30: 125 ug/kg/min via INTRAVENOUS
  Administered 2023-11-30: 140 mg via INTRAVENOUS
  Administered 2023-11-30: 20 mg via INTRAVENOUS
  Administered 2023-11-30: 40 mg via INTRAVENOUS
  Administered 2023-11-30 (×2): 20 mg via INTRAVENOUS
  Administered 2023-11-30: 40 mg via INTRAVENOUS

## 2023-11-30 MED ORDER — LIDOCAINE 2% (20 MG/ML) 5 ML SYRINGE
INTRAMUSCULAR | Status: DC | PRN
Start: 2023-11-30 — End: 2023-11-30
  Administered 2023-11-30: 50 mg via INTRAVENOUS

## 2023-11-30 MED ORDER — LACTATED RINGERS IV SOLN: INTRAVENOUS | Status: DC

## 2023-11-30 MED ORDER — STERILE WATER FOR IRRIGATION IR SOLN
Status: DC | PRN
  Administered 2023-11-30: 60 mL

## 2023-11-30 NOTE — Op Note (Signed)
 [facility] Patient Name: Kelly Douglas Procedure Date: 11/30/2023 10:04 AM MRN: [id number] Date of Birth: 03/23/1955 Attending MD: Lamar Ozell Hollingshead , MD, [id number] CSN: [id number] Age: 68 Admit Type: Outpatient Procedure:                Colonoscopy Indications:              High risk colon cancer surveillance: Personal                            history of colonic polyps Providers:                Lamar Ozell Hollingshead, MD, Crystal Page, Madelin Hunter, RN Referring MD:              Medicines:                Propofol  per Anesthesia Complications:            No immediate complications. Estimated Blood Loss:     Estimated blood loss: none. Procedure:                After obtaining informed consent, the colonoscope                            was passed under direct vision. Throughout the                            procedure, the patient's blood pressure, pulse, and                            oxygen saturations were monitored continuously. The                            [id number] ([id number]) Colon was introduced through                            the anus and advanced to the the ileocecal valve.                            The colonoscopy was performed without difficulty.                            The patient tolerated the procedure well. The                            quality of the bowel preparation was adequate. The                            terminal ileum, ileocecal valve, appendiceal                            orifice, and rectum were photographed. Scope In: 10:33:51 AM Scope Out: 10:47:10 AM Scope Withdrawal Time: 0 hours 4 minutes 17 seconds  Total Procedure Duration: 0 hours 13 minutes 19 seconds  Findings:  The perianal and digital rectal examinations were normal. Somewhat of a       redundant elongated colon.      Scattered medium-mouthed diverticula were found in the sigmoid colon and       descending colon.      The exam was  otherwise without abnormality on direct views. Rectal       mucosa seen well on?"face. Rectal vault too small to retroflex. 5 cm's       terminal ileum appeared normal Impression:               - Diverticulosis in the sigmoid colon and in the                            descending colon. Redundant colon.                           - The examination was otherwise normal on direct                            and retroflexion views. Normal-appearing terminal                            ileum                           - No specimens collected. Moderate Sedation:      Moderate (conscious) sedation was personally administered by an       anesthesia professional. The following parameters were monitored: oxygen       saturation, heart rate, blood pressure, respiratory rate, EKG, adequacy       of pulmonary ventilation, and response to care. Recommendation:           - Patient has a contact number available for                            emergencies. The signs and symptoms of potential                            delayed complications were discussed with the                            patient. Return to normal activities tomorrow.                            Written discharge instructions were provided to the                            patient.                           - Advance diet as tolerated.                           - Continue present medications.                           - Repeat colonoscopy in 7 years for surveillance.                           -  Return to GI office in 6 weeks. See EGD report. Procedure Code(s):        --- Professional ---                           [phone], Colonoscopy, flexible; diagnostic, including                            collection of specimen(s) by brushing or washing,                            when performed (separate procedure) Diagnosis Code(s):        --- Professional ---                           Z86.010, Personal history of colonic polyps                            K57.30, Diverticulosis of large intestine without                            perforation or abscess without bleeding CPT copyright 2022 American Medical Association. All rights reserved. The codes documented in this report are preliminary and upon coder review may  be revised to meet current compliance requirements. Lamar HERO. Bronislaw Switzer, MD Lamar Ozell Hollingshead, MD 11/30/2023 10:54:16 AM This report has been signed electronically. Number of Addenda: 0

## 2023-11-30 NOTE — Anesthesia Preprocedure Evaluation (Signed)
 Anesthesia Evaluation  Patient identified by MRN, date of birth, ID band Patient awake    Reviewed: Allergy & Precautions, H&P , NPO status , Patient's Chart, lab work & pertinent test results, reviewed documented beta blocker date and time   Airway Mallampati: II  TM Distance: >3 FB Neck ROM: full    Dental no notable dental hx.    Pulmonary neg pulmonary ROS, shortness of breath, COPD, Current Smoker   Pulmonary exam normal breath sounds clear to auscultation       Cardiovascular Exercise Tolerance: Good hypertension, negative cardio ROS  Rhythm:regular Rate:Normal     Neuro/Psych  PSYCHIATRIC DISORDERS Anxiety Depression    negative neurological ROS  negative psych ROS   GI/Hepatic negative GI ROS, Neg liver ROS,GERD  ,,  Endo/Other  negative endocrine ROSHypothyroidism    Renal/GU negative Renal ROS  negative genitourinary   Musculoskeletal   Abdominal   Peds  Hematology negative hematology ROS (+) Blood dyscrasia, anemia   Anesthesia Other Findings   Reproductive/Obstetrics negative OB ROS                              Anesthesia Physical Anesthesia Plan  ASA: 3  Anesthesia Plan: MAC   Post-op Pain Management:    Induction:   PONV Risk Score and Plan: Propofol  infusion  Airway Management Planned:   Additional Equipment:   Intra-op Plan:   Post-operative Plan:   Informed Consent: I have reviewed the patients History and Physical, chart, labs and discussed the procedure including the risks, benefits and alternatives for the proposed anesthesia with the patient or authorized representative who has indicated his/her understanding and acceptance.     Dental Advisory Given  Plan Discussed with: CRNA  Anesthesia Plan Comments:         Anesthesia Quick Evaluation

## 2023-11-30 NOTE — Op Note (Signed)
 [facility] Patient Name: Kelly Douglas Procedure Date: 11/30/2023 10:06 AM MRN: [id number] Date of Birth: 1955-06-28 Attending MD: Lamar Ozell Hollingshead , MD, [id number] CSN: [id number] Age: 68 Admit Type: Outpatient Procedure:                Upper GI endoscopy Indications:              Iron deficiency anemia, Anorexia, Weight loss Providers:                Lamar Ozell Hollingshead, MD, Crystal Page, Madelin Hunter, RN Referring MD:              Medicines:                Propofol  per Anesthesia Complications:            No immediate complications. Estimated Blood Loss:     Estimated blood loss was minimal. Procedure:                Pre-Anesthesia Assessment:                           - Prior to the procedure, a History and Physical                            was performed, and patient medications and                            allergies were reviewed. The patient's tolerance of                            previous anesthesia was also reviewed. The risks                            and benefits of the procedure and the sedation                            options and risks were discussed with the patient.                            All questions were answered, and informed consent                            was obtained. Prior Anticoagulants: The patient has                            taken no anticoagulant or antiplatelet agents. ASA                            Grade Assessment: III - A patient with severe                            systemic disease. After reviewing the risks and  benefits, the patient was deemed in satisfactory                            condition to undergo the procedure.                           After obtaining informed consent, the endoscope was                            passed under direct vision. Throughout the                            procedure, the patient's blood pressure, pulse, and                             oxygen saturations were monitored continuously. The                            [id number] ([id number]) Upper was introduced through                            the mouth, and advanced to the second part of                            duodenum. The upper GI endoscopy was accomplished                            without difficulty. The patient tolerated the                            procedure well. Scope In: 10:18:16 AM Scope Out: 10:27:26 AM Total Procedure Duration: 0 hours 9 minutes 10 seconds  Findings:      Tubular esophagus markedly abnormal with diffuse ulceration in a       geographic distribution with numerous areas of relatively spared mucosa.       No pseudomembranes present. No overlying plaque. Gastric cavity empty.       Small hiatal hernia present. Diffuse mucosal changes of snake skinning       or fish scale appearance of the gastric mucosa. Appearance consistent       with portal gastropathy. No ulcer or infiltrating process. Pylorus       patent.      The duodenal bulb and second portion of the duodenum were normal.       Biopsies of the esophagus and gastric mucosa taken for histologic study Impression:               - Ulcerated esophagus. Status post biopsy. Small                            hiatal hernia.                           - gastric mucosa consistent with portal                            hypertensive gastropathy?"status  post biopsy                           - Normal duodenal bulb and second portion of the                            duodenum.                           . Moderate Sedation:      Moderate (conscious) sedation was personally administered by an       anesthesia professional. The following parameters were monitored: oxygen       saturation, heart rate, blood pressure, respiratory rate, EKG, adequacy       of pulmonary ventilation, and response to care. Recommendation:           - Patient has a contact number available for                             emergencies. The signs and symptoms of potential                            delayed complications were discussed with the                            patient. Return to normal activities tomorrow.                            Written discharge instructions were provided to the                            patient.                           - Continue present medications. Advance diet as                            tolerated.                           - Await pathology results. See colonoscopy report. Procedure Code(s):        --- Professional ---                           [phone], Esophagogastroduodenoscopy, flexible,                            transoral; diagnostic, including collection of                            specimen(s) by brushing or washing, when performed                            (separate procedure) Diagnosis Code(s):        --- Professional ---                           D50.9,  Iron deficiency anemia, unspecified                           R63.0, Anorexia                           R63.4, Abnormal weight loss CPT copyright 2022 American Medical Association. All rights reserved. The codes documented in this report are preliminary and upon coder review may  be revised to meet current compliance requirements. Lamar HERO. Rainbow Salman, MD Lamar Ozell Hollingshead, MD 11/30/2023 10:50:32 AM This report has been signed electronically. Number of Addenda: 0

## 2023-11-30 NOTE — Transfer of Care (Signed)
 Immediate Anesthesia Transfer of Care Note  Patient: Kelly Douglas  Procedure(s) Performed: COLONOSCOPY EGD (ESOPHAGOGASTRODUODENOSCOPY)  Patient Location: Endoscopy Unit  Anesthesia Type:MAC  Level of Consciousness: awake and patient cooperative  Airway & Oxygen Therapy: Patient Spontanous Breathing  Post-op Assessment: Report given to RN and Post -op Vital signs reviewed and stable  Post vital signs: Reviewed and stable  Last Vitals:  Vitals Value Taken Time  BP 117/70 11/30/23 10:52  Temp 36.5 C 11/30/23 10:52  Pulse 92 11/30/23 10:52  Resp 15 11/30/23 10:52  SpO2 97 % 11/30/23 10:52    Last Pain:  Vitals:   11/30/23 1052  TempSrc: Oral  PainSc: 0-No pain      Patients Stated Pain Goal: 7 (11/30/23 0918)  Complications: No notable events documented.

## 2023-11-30 NOTE — H&P (Signed)
 @LOGO @   Gastroenterology Progress Note    Primary Care Physician:  Antonetta Rollene BRAVO, MD Primary Gastroenterologist:  Dr. Shaaron  Pre-Procedure History & Physical: HPI:  Kelly Douglas is a 68 y.o. female here for further evaluation of iron deficiency anemia anorexia and weight loss via EGD and colonoscopy.  History of colonic adenomas removed previously.  Denies dysphagia.  Past Medical History:  Diagnosis Date   Anxiety    Arteriosclerotic cardiovascular disease (ASCVD) 2013   coronary calcification-left main, LAD and CX; small pericardial effusion   Arthritis    hips   Cancer (HCC)    Lung Cancer    Chronic back pain    Chronic bronchitis    Chronic lung disease    Chronic scarring and volume loss-left lung; characteristics of a chronic infectious process-possible MAI   Depression    Dyspnea    occasional    GERD (gastroesophageal reflux disease)    Dr Shaaron EGD 09/2009->esophagitis, sm HH, antral erosions, atonic esophagus   History of radiation therapy    Left and Right Lung- 11/24/21-12/07/21- Dr. Lynwood Nasuti   Hyperlipidemia    Hypertension    Hypothyroid 1981 approx   Scleroderma (HCC)    Seasonal allergies    Syncope    Multiple spells over the past 40+ years, likely neurocardiogenic   Tobacco abuse 06/24/2009    Past Surgical History:  Procedure Laterality Date   BILATERAL SALPINGOOPHORECTOMY  2013    Dr. Jayne; uterus remains in situ   BRONCHIAL BIOPSY  11/21/2019   Procedure: BRONCHIAL BIOPSIES;  Surgeon: Shelah Lamar RAMAN, MD;  Location: [facility] ENDOSCOPY;  Service: Pulmonary;;   BRONCHIAL BRUSHINGS  11/21/2019   Procedure: BRONCHIAL BRUSHINGS;  Surgeon: Shelah Lamar RAMAN, MD;  Location: [facility] ENDOSCOPY;  Service: Pulmonary;;   BRONCHIAL NEEDLE ASPIRATION BIOPSY  11/21/2019   Procedure: BRONCHIAL NEEDLE ASPIRATION BIOPSIES;  Surgeon: Shelah Lamar RAMAN, MD;  Location: [facility] ENDOSCOPY;  Service: Pulmonary;;   BRONCHIAL WASHINGS  11/21/2019   Procedure: BRONCHIAL WASHINGS;   Surgeon: Shelah Lamar RAMAN, MD;  Location: [facility] ENDOSCOPY;  Service: Pulmonary;;   COLONOSCOPY  09/28/09   anal papilla otherwise normal   COLONOSCOPY N/A 08/29/2012   MFM:Rnonwpr polyp-removed as described above. tubular adenoma   COLONOSCOPY WITH PROPOFOL  N/A 11/11/2019   Procedure: COLONOSCOPY WITH PROPOFOL ;  Surgeon: Shaaron Lamar HERO, MD;  Location: [facility] ENDO SUITE;  Service: Endoscopy;  Laterality: N/A;  2:30pm   ESOPHAGOGASTRODUODENOSCOPY  10/07/09   Dr. Verneice of esophageal mucosa, diffusely ?esophagitis (bx benign), small HH/antral erosions, erythema bx benign. 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 Hiatal hernia   FUDUCIAL PLACEMENT  10/18/2017   Procedure: PLACEMENT OF 3 FUDUCIAL INTO LEFT LOWER LOBE OF LUNG-TARGET 1;  Surgeon: Shelah Lamar RAMAN, MD;  Location: [facility] OR;  Service: Thoracic;;   LAPAROSCOPIC APPENDECTOMY  07/16/2011   Procedure: APPENDECTOMY LAPAROSCOPIC;  Surgeon: Thresa JAYSON Pulling, MD;  Location: [facility] ORS;  Service: General;  Laterality: N/A;   POLYPECTOMY  11/11/2019   Procedure: POLYPECTOMY;  Surgeon: Shaaron Lamar HERO, MD;  Location: [facility] ENDO SUITE;  Service: Endoscopy;;   TUBAL LIGATION     VIDEO BRONCHOSCOPY  10/18/2017   VIDEO BRONCHOSCOPY WITH ENDOBRONCHIAL NAVIGATION N/A 10/18/2017   Procedure: VIDEO BRONCHOSCOPY WITH ENDOBRONCHIAL NAVIGATION;  Surgeon: Shelah Lamar RAMAN, MD;  Location: [facility] OR;  Service: Thoracic;  Laterality: N/A;   VIDEO BRONCHOSCOPY WITH ENDOBRONCHIAL NAVIGATION Left  11/21/2019   Procedure: VIDEO BRONCHOSCOPY WITH ENDOBRONCHIAL NAVIGATION;  Surgeon: Shelah Lamar RAMAN, MD;  Location: [facility] ENDOSCOPY;  Service: Pulmonary;  Laterality: Left;    Prior to Admission medications   Medication Sig Start Date End Date Taking? Authorizing Provider  acetaminophen  (TYLENOL ) 650 MG CR tablet  Take 1,300 mg by mouth every 8 (eight) hours as needed for pain.    Yes [provider]  amLODipine  (NORVASC ) 5 MG tablet TAKE 1 TABLET EVERY DAY 02/22/23  Yes Antonetta Rollene BRAVO, MD  atorvastatin  (LIPITOR) 10 MG tablet TAKE 1 TABLET EVERY DAY 10/30/23  Yes Antonetta Rollene BRAVO, MD  DULoxetine  (CYMBALTA ) 60 MG capsule TAKE 1 CAPSULE EVERY DAY 09/21/23  Yes Antonetta Rollene BRAVO, MD  ferrous sulfate  325 (65 FE) MG EC tablet Take 325 mg by mouth daily with breakfast.   Yes [provider]  levothyroxine  (SYNTHROID ) 75 MCG tablet TAKE 1 TABLET EVERY OTHER DAY ALTERNATING WITH 1/2 TABLET EVERY OTHER DAY 07/10/23  Yes Antonetta Rollene BRAVO, MD  linaclotide [facility]) 145 MCG CAPS capsule Take 1 capsule (145 mcg total) by mouth daily before breakfast. 10/17/23  Yes Mahon, Courtney L, NP  magnesium  oxide (MAG-OX) 400 MG tablet Take 1 tablet (400 mg total) by mouth daily. 10/26/23  Yes Tobie Suzzane POUR, MD  meloxicam  (MOBIC ) 7.5 MG tablet TAKE 1 TABLET EVERY DAY 09/21/23  Yes Antonetta Rollene BRAVO, MD  mirtazapine  (REMERON ) 15 MG tablet Take 1 tablet (15 mg total) by mouth at bedtime. 10/26/23  Yes Tobie Suzzane POUR, MD  montelukast  (SINGULAIR ) 10 MG tablet TAKE 1 TABLET AT BEDTIME 02/22/23  Yes Antonetta Rollene BRAVO, MD  Multiple Vitamin (MULTIVITAMIN WITH MINERALS) TABS tablet Take 1 tablet by mouth daily.   Yes [provider]  Na Sulfate-K Sulfate-Mg Sulfate concentrate (SUPREP) 17.5-3.13-1.6 GM/177ML SOLN As directed 11/22/23   Nashla Althoff, Lamar HERO, MD  pantoprazole  (PROTONIX ) 40 MG tablet TAKE 1 TABLET TWICE DAILY 09/21/23  Yes Antonetta Rollene BRAVO, MD  potassium chloride  (KLOR-CON  M) 10 MEQ tablet Take 1 tablet (10 mEq total) by mouth 2 (two) times daily. 07/18/23  Yes Antonetta Rollene BRAVO, MD  albuterol  (VENTOLIN  HFA) [phone] Base) MCG/ACT inhaler Inhale 1-2 puffs into the lungs every 6 (six) hours as needed for wheezing or shortness of breath. 07/18/23   Antonetta Rollene BRAVO, MD    Allergies as of 11/22/2023  - Review Complete 11/08/2023  Allergen Reaction Noted   Aspirin  Other (See Comments) 05/19/2015    Family History  Problem Relation Age of Onset   Aneurysm Mother    Stroke Father    Aneurysm Father    Hypertension Sister    Cancer Brother        throat cancer   Coronary artery disease Brother    Hypertension Brother    Down syndrome Brother    Hypertension Brother    Hypertension Brother        father/mother   Throat cancer Brother 49       throat cancer   Diabetes Brother    Diabetes Brother    Anesthesia problems Neg Hx    Hypotension Neg Hx    Malignant hyperthermia Neg Hx    Pseudochol deficiency Neg Hx    Colon cancer Neg Hx     Social History   Socioeconomic History   Marital status: Married    Spouse name: Not on file   Number of children: 2   Years of education: Not on file   Highest education level:  Not on file  Occupational History   Occupation: disabled    Employer: NOT EMPLOYED  Tobacco Use   Smoking status: Every Day    Current packs/day: 0.50    Average packs/day: 0.5 packs/day for 52.4 years (26.2 ttl pk-yrs)    Types: Cigarettes    Start date: 07/14/1971   Smokeless tobacco: Never   Tobacco comments:    0.5 packs of cigarettes smoked daily 10/26/23  Vaping Use   Vaping status: Never Used  Substance and Sexual Activity   Alcohol use: Not Currently    Alcohol/week: 1.0 standard drink of alcohol    Types: 1 Cans of beer per week    Comment: quit June 2012-used to drink 6 pack daily, occasional beer per pt 11/20/19   Drug use: No   Sexual activity: Yes    Birth control/protection: Post-menopausal  Other Topics Concern   Not on file  Social History Narrative   Not on file   Social Drivers of Health   Financial Resource Strain: High Risk (07/12/2023)   Overall Financial Resource Strain (CARDIA)    Difficulty of Paying Living Expenses: Hard  Food Insecurity: No Food Insecurity (10/23/2023)   Hunger Vital Sign    Worried About Running Out  of Food in the Last Year: Never true    Ran Out of Food in the Last Year: Never true  Recent Concern: Food Insecurity - Food Insecurity Present (08/01/2023)   Hunger Vital Sign    Worried About Running Out of Food in the Last Year: Sometimes true    Ran Out of Food in the Last Year: Never true  Transportation Needs: No Transportation Needs (11/08/2023)   PRAPARE - Administrator, Civil Service (Medical): No    Lack of Transportation (Non-Medical): No  Physical Activity: Inactive (07/05/2023)   Exercise Vital Sign    Days of Exercise per Week: 0 days    Minutes of Exercise per Session: 0 min  Stress: No Stress Concern Present (11/08/2023)   [employer] of Occupational Health - Occupational Stress Questionnaire    Feeling of Stress: Only a little  Social Connections: Patient Declined (10/15/2023)   Social Connection and Isolation Panel    Frequency of Communication with Friends and Family: Patient declined    Frequency of Social Gatherings with Friends and Family: Patient declined    Attends Religious Services: Patient declined    Database Administrator or Organizations: Patient declined    Attends Banker Meetings: Patient declined    Marital Status: Patient declined  Intimate Partner Violence: Not At Risk (10/23/2023)   Humiliation, Afraid, Rape, and Kick questionnaire    Fear of Current or Ex-Partner: No    Emotionally Abused: No    Physically Abused: No    Sexually Abused: No    Review of Systems   See HPI, otherwise negative ROS  Physical Exam: BP (!) 160/88   Pulse 95   Temp 98.2 F (36.8 C) (Oral)   Resp 10   Ht 5' 8 (1.727 m)   Wt 70.3 kg   LMP  (LMP Unknown)   SpO2 99%   BMI 23.57 kg/m  General:   Alert,  Well-developed, well-nourished, pleasant and cooperative in NAD Neck:  Supple; no masses or thyromegaly. No significant cervical adenopathy. Lungs:  Clear throughout to auscultation.   No wheezes, crackles, or rhonchi. No acute  distress. Heart:  Regular rate and rhythm; no murmurs, clicks, rubs,  or gallops. Abdomen: Non-distended, normal bowel  sounds.  Soft and nontender without appreciable mass or hepatosplenomegaly.    Impression/Plan:   68 year old lady with anorexia weight loss iron deficiency anemia and history of colonic adenomas.  She is here for further evaluation via EGD and colonoscopy. The risks, benefits, limitations, imponderables and alternatives regarding both EGD and colonoscopy have been reviewed with the patient. Questions have been answered. All parties agreeable.      Notice: This dictation was prepared with Dragon dictation along with smaller phrase technology. Any transcriptional errors that result from this process are unintentional and may not be corrected upon review.

## 2023-11-30 NOTE — Anesthesia Procedure Notes (Signed)
 Date/Time: 11/30/2023 10:11 AM  Performed by: Barbarann Verneita RAMAN, CRNAPre-anesthesia Checklist: Patient identified, Emergency Drugs available, Suction available, Timeout performed and Patient being monitored Patient Re-evaluated:Patient Re-evaluated prior to induction Oxygen Delivery Method: Nasal Cannula

## 2023-11-30 NOTE — Discharge Instructions (Signed)
 EGD Discharge instructions Please read the instructions outlined below and refer to this sheet in the next few weeks. These discharge instructions provide you with general information on caring for yourself after you leave the hospital. Your doctor may also give you specific instructions. While your treatment has been planned according to the most current medical practices available, unavoidable complications occasionally occur. If you have any problems or questions after discharge, please call your doctor. ACTIVITY You may resume your regular activity but move at a slower pace for the next 24 hours.  Take frequent rest periods for the next 24 hours.  Walking will help expel (get rid of) the air and reduce the bloated feeling in your abdomen.  No driving for 24 hours (because of the anesthesia (medicine) used during the test).  You may shower.  Do not sign any important legal documents or operate any machinery for 24 hours (because of the anesthesia used during the test).  NUTRITION Drink plenty of fluids.  You may resume your normal diet.  Begin with a light meal and progress to your normal diet.  Avoid alcoholic beverages for 24 hours or as instructed by your caregiver.  MEDICATIONS You may resume your normal medications unless your caregiver tells you otherwise.  WHAT YOU CAN EXPECT TODAY You may experience abdominal discomfort such as a feeling of fullness or "gas" pains.  FOLLOW-UP Your doctor will discuss the results of your test with you.  SEEK IMMEDIATE MEDICAL ATTENTION IF ANY OF THE FOLLOWING OCCUR: Excessive nausea (feeling sick to your stomach) and/or vomiting.  Severe abdominal pain and distention (swelling).  Trouble swallowing.  Temperature over 101 F (37.8 C).  Rectal bleeding or vomiting of blood.    Colonoscopy Discharge Instructions  Read the instructions outlined below and refer to this sheet in the next few weeks. These discharge instructions provide you with  general information on caring for yourself after you leave the hospital. Your doctor may also give you specific instructions. While your treatment has been planned according to the most current medical practices available, unavoidable complications occasionally occur. If you have any problems or questions after discharge, call Dr. Shaaron at [phone]. ACTIVITY You may resume your regular activity, but move at a slower pace for the next 24 hours.  Take frequent rest periods for the next 24 hours.  Walking will help get rid of the air and reduce the bloated feeling in your belly (abdomen).  No driving for 24 hours (because of the medicine (anesthesia) used during the test).   Do not sign any important legal documents or operate any machinery for 24 hours (because of the anesthesia used during the test).  NUTRITION Drink plenty of fluids.  You may resume your normal diet as instructed by your doctor.  Begin with a light meal and progress to your normal diet. Heavy or fried foods are harder to digest and may make you feel sick to your stomach (nauseated).  Avoid alcoholic beverages for 24 hours or as instructed.  MEDICATIONS You may resume your normal medications unless your doctor tells you otherwise.  WHAT YOU CAN EXPECT TODAY Some feelings of bloating in the abdomen.  Passage of more gas than usual.  Spotting of blood in your stool or on the toilet paper.  IF YOU HAD POLYPS REMOVED DURING THE COLONOSCOPY: No aspirin  products for 7 days or as instructed.  No alcohol for 7 days or as instructed.  Eat a soft diet for the next 24 hours.  FINDING  OUT THE RESULTS OF YOUR TEST Not all test results are available during your visit. If your test results are not back during the visit, make an appointment with your caregiver to find out the results. Do not assume everything is normal if you have not heard from your caregiver or the medical facility. It is important for you to follow up on all of your test  results.  SEEK IMMEDIATE MEDICAL ATTENTION IF: You have more than a spotting of blood in your stool.  Your belly is swollen (abdominal distention).  You are nauseated or vomiting.  You have a temperature over 101.  You have abdominal pain or discomfort that is severe or gets worse throughout the day.    You have severe inflammation in your esophagus.  Your stomach is inflamed.  Biopsies were taken  No polyps found in your colon.  Office visit with us  in 6 weeks  I will be back in touch with you next week with biopsy results.  It is recommended you return for repeat colonoscopy in 7 years

## 2023-12-01 LAB — SURGICAL PATHOLOGY

## 2023-12-01 NOTE — Anesthesia Postprocedure Evaluation (Signed)
 Anesthesia Post Note  Patient: Kelly Douglas  Procedure(s) Performed: COLONOSCOPY EGD (ESOPHAGOGASTRODUODENOSCOPY)  Patient location during evaluation: Phase II Anesthesia Type: MAC Level of consciousness: awake Pain management: pain level controlled Vital Signs Assessment: post-procedure vital signs reviewed and stable Respiratory status: spontaneous breathing and respiratory function stable Cardiovascular status: blood pressure returned to baseline and stable Postop Assessment: no headache and no apparent nausea or vomiting Anesthetic complications: no Comments: Late entry   No notable events documented.   Last Vitals:  Vitals:   11/30/23 0931 11/30/23 1052  BP: (!) 160/88 117/70  Pulse: 95 92  Resp: 10 15  Temp: 36.8 C 36.5 C  SpO2: 99% 97%    Last Pain:  Vitals:   11/30/23 1055  TempSrc:   PainSc: 0-No pain                 Yvonna JINNY Bosworth

## 2023-12-03 ENCOUNTER — Ambulatory Visit: Payer: Self-pay | Admitting: Internal Medicine

## 2023-12-04 ENCOUNTER — Encounter (HOSPITAL_COMMUNITY): Payer: Self-pay | Admitting: Internal Medicine

## 2023-12-04 ENCOUNTER — Encounter: Payer: Self-pay | Admitting: Licensed Clinical Social Worker

## 2023-12-04 ENCOUNTER — Telehealth: Payer: Self-pay | Admitting: Licensed Clinical Social Worker

## 2023-12-04 NOTE — Patient Instructions (Signed)
 Katelynd P Repetto - I am sorry I was unable to reach you today for our scheduled appointment. I work with Antonetta Rollene BRAVO, MD and am calling to support your healthcare needs. Please contact me at [phone] at your earliest convenience. I look forward to speaking with you soon.   Thank you,  Lyle Rung, BSW, MSW, LCSW Licensed Clinical Social Worker [employer]   [facility] [email or web address] Direct Dial: [phone]

## 2023-12-13 ENCOUNTER — Other Ambulatory Visit: Payer: Self-pay | Admitting: Family Medicine

## 2023-12-13 DIAGNOSIS — Z9109 Other allergy status, other than to drugs and biological substances: Secondary | ICD-10-CM

## 2023-12-13 DIAGNOSIS — I10 Essential (primary) hypertension: Secondary | ICD-10-CM

## 2023-12-18 ENCOUNTER — Other Ambulatory Visit: Payer: Self-pay | Admitting: Licensed Clinical Social Worker

## 2023-12-18 ENCOUNTER — Encounter: Payer: Self-pay | Admitting: Licensed Clinical Social Worker

## 2023-12-18 NOTE — Patient Instructions (Signed)
 Kelly Douglas - I am sorry I was unable to reach you today for our scheduled appointment. I work with Antonetta Rollene BRAVO, MD and am calling to support your healthcare needs. Please contact me at [phone] at your earliest convenience. I look forward to speaking with you soon.   Thank you,  Lyle Rung, BSW, MSW, LCSW Licensed Clinical Social Worker [employer]   [facility] [email or web address] Direct Dial: [phone]

## 2024-01-02 ENCOUNTER — Other Ambulatory Visit

## 2024-01-02 ENCOUNTER — Inpatient Hospital Stay: Attending: Physician Assistant

## 2024-01-02 ENCOUNTER — Ambulatory Visit (HOSPITAL_COMMUNITY): Admission: RE | Admit: 2024-01-02 | Discharge: 2024-01-02 | Attending: Hematology | Admitting: Hematology

## 2024-01-02 DIAGNOSIS — Z923 Personal history of irradiation: Secondary | ICD-10-CM | POA: Diagnosis not present

## 2024-01-02 DIAGNOSIS — Z79899 Other long term (current) drug therapy: Secondary | ICD-10-CM | POA: Diagnosis not present

## 2024-01-02 DIAGNOSIS — E039 Hypothyroidism, unspecified: Secondary | ICD-10-CM | POA: Diagnosis not present

## 2024-01-02 DIAGNOSIS — C3492 Malignant neoplasm of unspecified part of left bronchus or lung: Secondary | ICD-10-CM | POA: Insufficient documentation

## 2024-01-02 DIAGNOSIS — F17210 Nicotine dependence, cigarettes, uncomplicated: Secondary | ICD-10-CM | POA: Diagnosis not present

## 2024-01-02 DIAGNOSIS — D508 Other iron deficiency anemias: Secondary | ICD-10-CM

## 2024-01-02 DIAGNOSIS — D509 Iron deficiency anemia, unspecified: Secondary | ICD-10-CM | POA: Insufficient documentation

## 2024-01-02 DIAGNOSIS — Z85118 Personal history of other malignant neoplasm of bronchus and lung: Secondary | ICD-10-CM | POA: Insufficient documentation

## 2024-01-02 LAB — CBC WITH DIFFERENTIAL/PLATELET
Abs Immature Granulocytes: 0.03 K/uL (ref 0.00–0.07)
Basophils Absolute: 0.1 K/uL (ref 0.0–0.1)
Basophils Relative: 0 %
Eosinophils Absolute: 0.1 K/uL (ref 0.0–0.5)
Eosinophils Relative: 1 %
HCT: 37.6 % (ref 36.0–46.0)
Hemoglobin: 12.2 g/dL (ref 12.0–15.0)
Immature Granulocytes: 0 %
Lymphocytes Relative: 39 %
Lymphs Abs: 4.9 K/uL — ABNORMAL HIGH (ref 0.7–4.0)
MCH: 26.6 pg (ref 26.0–34.0)
MCHC: 32.4 g/dL (ref 30.0–36.0)
MCV: 81.9 fL (ref 80.0–100.0)
Monocytes Absolute: 1 K/uL (ref 0.1–1.0)
Monocytes Relative: 8 %
Neutro Abs: 6.3 K/uL (ref 1.7–7.7)
Neutrophils Relative %: 52 %
Platelets: 333 K/uL (ref 150–400)
RBC: 4.59 MIL/uL (ref 3.87–5.11)
RDW: 14.4 % (ref 11.5–15.5)
WBC: 12.4 K/uL — ABNORMAL HIGH (ref 4.0–10.5)
nRBC: 0 % (ref 0.0–0.2)

## 2024-01-02 LAB — COMPREHENSIVE METABOLIC PANEL WITH GFR
ALT: 10 U/L (ref 0–44)
AST: 26 U/L (ref 15–41)
Albumin: 4.7 g/dL (ref 3.5–5.0)
Alkaline Phosphatase: 138 U/L — ABNORMAL HIGH (ref 38–126)
Anion gap: 17 — ABNORMAL HIGH (ref 5–15)
BUN: 6 mg/dL — ABNORMAL LOW (ref 8–23)
CO2: 25 mmol/L (ref 22–32)
Calcium: 8.8 mg/dL — ABNORMAL LOW (ref 8.9–10.3)
Chloride: 102 mmol/L (ref 98–111)
Creatinine, Ser: 0.87 mg/dL (ref 0.44–1.00)
GFR, Estimated: >60 mL/min (ref >60)
Glucose, Bld: 86 mg/dL (ref 70–99)
Potassium: 2.9 mmol/L — ABNORMAL LOW (ref 3.5–5.1)
Sodium: 144 mmol/L (ref 135–145)
Total Bilirubin: 0.2 mg/dL (ref 0.0–1.2)
Total Protein: 7.6 g/dL (ref 6.5–8.1)

## 2024-01-02 LAB — IRON AND TIBC
Iron: 45 ug/dL (ref 28–170)
Saturation Ratios: 13 % (ref 10.4–31.8)
TIBC: 339 ug/dL (ref 250–450)
UIBC: 294 ug/dL

## 2024-01-02 LAB — FERRITIN: Ferritin: 124 ng/mL (ref 11–307)

## 2024-01-02 MED ORDER — IOHEXOL 300 MG/ML  SOLN
80.0000 mL | Freq: Once | INTRAMUSCULAR | Status: AC | PRN
  Administered 2024-01-02: 14:00:00 80 mL via INTRAVENOUS

## 2024-01-08 NOTE — Progress Notes (Unsigned)
 "  Kelly Douglas [address], [place] [address]   CLINIC:  Medical Oncology/Hematology  PCP:  Antonetta Rollene BRAVO, MD [address], [address] / [place] [address] [phone]    BRIEF ONCOLOGIC HISTORY:  REASON FOR VISIT: Follow-up for stage I adenocarcinoma of the left lower lobe of the lung  PRIOR THERAPY: XRT to left and right lung lesions from 11/24/2021 through 12/07/2021  CURRENT THERAPY: Surveillance  INTERVAL HISTORY:   [name], a 68 y.o. female, returns for routine follow-up of stage I adenocarcinoma of the left lower lobe of the lung.  Kelly Douglas was last seen on 07/10/2023 by Dr. Rogers.   In the interim since last visit, she was hospitalized in September 2025 due to hypomagnesemia and hypokalemia. She has not had any other any surgeries, hospitalizations, or changes in baseline health status.   At today's visit, she reports feeling fair.  She  reports 50% energy and 75% appetite.   She  is maintaining stable weight at this time.  She continues to smoke 0.5-1 PPD cigarettes.  She is not interested in quitting at this time.  She has a good understanding of the health risks posed by ongoing tobacco use, but she states that life is short so she may as well enjoy some cigarettes. She has an acute cough related to current head cold. Otherwise, she denies any new onset cough, hemoptysis, dyspnea, chest pain. No recurrent lung infections. No voice changes or unintentional weight loss.  She denies any rectal bleeding or melena. She is taking iron tablet daily.    ASSESSMENT & PLAN:  1.  Clinical stage I adenocarcinoma of the left lower lobe of the lung - She was followed by Dr. Shelah for several years with close surveillance of left lower lobe lung nodule - She had increase in size of the left lower lobe lung nodule on CT scan from 08/05/2019 - PET scan on 12/09/2019 showed 2 cm left lower lobe pulmonary nodule with some solid  elements with maximum SUV 4.7.  No adenopathy reported. - Bronchoscopy and biopsy of the left lower lobe nodule consistent with adenocarcinoma.  Left lower lobe brushings also positive for adenocarcinoma.  Left upper lobe brushings did not show any malignant cells. - XRT to the left and right lungs lesions from 11/24/2021 through 12/07/2021 - Most recent CT chest with contrast (01/02/2024): Overall stable without evidence of recurrent or metastatic disease. Stable left lower lobe nodule with surrounding postradiation scarring   Stable areas of nodular consolidation in the left upper lobe Stable 6 mm anterior segment right upper lobe nodule Areas of focal ground glass in right upper lobe are new and may be related to smoking-related respiratory bronchiolitis, recommended attention on follow-up.  (Has not been seen by pulmonology since diagnosis of lung cancer in December 2021, also has diagnosis of COPD/emphysema.) Enlarged pulmonic trunk, indicative of pulmonary arterial hypertension. New posterior lateral left 7th and 8th rib fractures, possibly treatment related.  Difficult to exclude pathologic fractures.  (Bone density scan from 08/07/2023 showed osteoporosis with T-score -2.8) Air and food debris and dilated esophagus suggest dysmotility. Gastric wall thickening.  (Recent EGD/colonoscopy by Dr. Shaaron on 11/30/2023 showed ulcerated esophagus, small hiatal hernia, and gastric mucosa consistent with portal hypertensive gastropathy.)  - No abnormal cough, hemoptysis, chest pain, or unintentional weight loss - She continues to smoke 0.5-1 PPD cigarettes daily.  She is not interested in smoking cessation at this time. - PLAN: Continue  CT chest with contrast every 6 months for total of 5 years after treatment (through November 2028). - RTC 6 months with repeat CT chest and office visit. - Referral back to pulmonology (Dr. Shelah) for COPD, emphysema, and bronchiolitis.  Otherwise follow-up with PCP and  gastroenterology for further incidental findings noted above. [facility] GUIDELINES for SURVIVORSHIP & SURVEILLANCE of NON SMALL CELL LUNG CANCER after completion of definitive therapy (as of September 2025): - STAGE I-II (primary treatment included RT) or STAGE III or STAGE IV (oligometastatic with all sites treated with definitive intent):  H&P/CT chest with/without contrast every 3 to 6 months for 3 years Then H&P and CT chest with/without contrast every 6 months for the next 2 years (total of 5 years) Then H&P and low-dose noncontrast CT chest annually.   Residual or new radiographic abnormalities may require more frequent imaging. - Cancer survivorship care - Smoking cessation, advice, counseling, and pharmacotherapy - FDG-PET/CT is not routinely indicated - Brain MRI as clinically indicated based on risk assessment - If concern for recurrence, FDG-PET/CT + MRI brain with contrast     2.  Tobacco use - Current active smoker, 0.5-1 PPD cigarettes since age 67 - She is not interested in quitting at this time.  She has a good understanding of the health risks posed by ongoing tobacco use, but she states that life is short so she may as well enjoy some cigarettes. She has an acute cough related to current head cold. - PLAN: Patient declined referral to smoking cessation counselor at this time.  3.  History of microcytic anemia, RESOLVED - She is taking iron tablet daily.   - EGD/colonoscopy by Dr. Shaaron (11/30/2023): Diverticulosis in colon.  Ulcerated esophagus, small hiatal hernia, and portal hypertensive gastropathy noted on EGD. - Denies any rectal bleeding or melena.   - Most recent labs (01/02/2024): Hgb 12.2/MCV 81.9.  Ferritin 124, iron saturation 13%. - PLAN: Continue iron tablet EVERY OTHER day.  Repeat CBC and iron panel in 6 months.  4.  Social/family history: - Current active smoker, half pack per day since age 44. - Lives with husband and brother at home.  Activity is  limited secondary to back and hip pain.  She has psoriatic arthritis. - Brother had throat cancer and paternal cousin had throat cancer.  PLAN SUMMARY: >> Referral entered to Dr. Shelah (pulmonology) to reestablish care for COPD/emphysema and bronchiolitis >> CT chest in 6 months >> Labs in 6 months = CBC/D, CMP, ferritin, iron/TIBC >> OFFICE visit in 6 months (1 week after labs)    REVIEW OF SYSTEMS:   Review of Systems  Constitutional:  Positive for fatigue. Negative for appetite change, chills, diaphoresis, fever and unexpected weight change.  HENT:   Negative for lump/mass and nosebleeds.   Eyes:  Negative for eye problems.  Respiratory:  Positive for cough. Negative for hemoptysis and shortness of breath.   Cardiovascular:  Negative for chest pain, leg swelling and palpitations.  Gastrointestinal:  Negative for abdominal pain, blood in stool, constipation, diarrhea, nausea and vomiting.  Genitourinary:  Negative for hematuria.   Musculoskeletal:  Positive for back pain.  Skin: Negative.   Neurological:  Negative for dizziness, headaches and light-headedness.  Hematological:  Does not bruise/bleed easily.    PHYSICAL EXAM:   Performance status (ECOG): 1 - Symptomatic but completely ambulatory  Vitals:   01/09/24 1337 01/09/24 1340  BP: (!) 151/85 (!) 143/78  Pulse: (!) 117 (!) 109  Resp: 19   Temp:  98.1 F (36.7 C)   SpO2: 91% 94%   Wt Readings from Last 3 Encounters:  01/09/24 156 lb (70.8 kg)  11/30/23 155 lb (70.3 kg)  10/26/23 153 lb 6.4 oz (69.6 kg)   Physical Exam Constitutional:      Appearance: Normal appearance. She is normal weight.  Cardiovascular:     Heart sounds: Normal heart sounds.  Pulmonary:     Breath sounds: Normal breath sounds. Decreased air movement present.  Neurological:     General: No focal deficit present.     Mental Status: Mental status is at baseline.  Psychiatric:        Behavior: Behavior normal. Behavior is cooperative.       PAST MEDICAL/SURGICAL HISTORY:  Past Medical History:  Diagnosis Date   Anxiety    Arteriosclerotic cardiovascular disease (ASCVD) 2013   coronary calcification-left main, LAD and CX; small pericardial effusion   Arthritis    hips   Cancer (HCC)    Lung Cancer    Chronic back pain    Chronic bronchitis    Chronic lung disease    Chronic scarring and volume loss-left lung; characteristics of a chronic infectious process-possible MAI   Depression    Dyspnea    occasional    GERD (gastroesophageal reflux disease)    Dr Shaaron EGD 09/2009->esophagitis, sm HH, antral erosions, atonic esophagus   History of radiation therapy    Left and Right Lung- 11/24/21-12/07/21- Dr. Lynwood Nasuti   Hyperlipidemia    Hypertension    Hypothyroid 1981 approx   Scleroderma (HCC)    Seasonal allergies    Syncope    Multiple spells over the past 40+ years, likely neurocardiogenic   Tobacco abuse 06/24/2009   Past Surgical History:  Procedure Laterality Date   BILATERAL SALPINGOOPHORECTOMY  2013    Dr. Jayne; uterus remains in situ   BRONCHIAL BIOPSY  11/21/2019   Procedure: BRONCHIAL BIOPSIES;  Surgeon: Shelah Lamar RAMAN, MD;  Location[facility] ENDOSCOPY;  Service: Pulmonary;;   BRONCHIAL BRUSHINGS  11/21/2019   Procedure: BRONCHIAL BRUSHINGS;  Surgeon: Shelah Lamar RAMAN, MD;  Location: [facility] ENDOSCOPY;  Service: Pulmonary;;   BRONCHIAL NEEDLE ASPIRATION BIOPSY  11/21/2019   Procedure: BRONCHIAL NEEDLE ASPIRATION BIOPSIES;  Surgeon: Shelah Lamar RAMAN, MD;  Location: [facility] ENDOSCOPY;  Service: Pulmonary;;   BRONCHIAL WASHINGS  11/21/2019   Procedure: BRONCHIAL WASHINGS;  Surgeon: Shelah Lamar RAMAN, MD;  Location: [facility] ENDOSCOPY;  Service: Pulmonary;;   COLONOSCOPY  09/28/09   anal papilla otherwise normal   COLONOSCOPY N/A 08/29/2012   MFM:Rnonwpr polyp-removed as described above. tubular adenoma   COLONOSCOPY N/A 11/30/2023   Procedure: COLONOSCOPY;  Surgeon: Shaaron Lamar HERO, MD;  Location: [facility] ENDO SUITE;  Service:  Endoscopy;  Laterality: N/A;  10:30 am, ok rm 1   COLONOSCOPY WITH PROPOFOL  N/A 11/11/2019   Procedure: COLONOSCOPY WITH PROPOFOL ;  Surgeon: Shaaron Lamar HERO, MD;  Location: [facility] ENDO SUITE;  Service: Endoscopy;  Laterality: N/A;  2:30pm   ESOPHAGOGASTRODUODENOSCOPY  10/07/09   Dr. Verneice of esophageal mucosa, diffusely ?esophagitis (bx benign), small HH/antral erosions, erythema bx benign. 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 Hiatal hernia   FUDUCIAL PLACEMENT  10/18/2017   Procedure: PLACEMENT OF 3 FUDUCIAL INTO LEFT  LOWER LOBE OF LUNG-TARGET 1;  Surgeon: Shelah Lamar RAMAN, MD;  Location: [facility] OR;  Service: Thoracic;;   LAPAROSCOPIC APPENDECTOMY  07/16/2011   Procedure: APPENDECTOMY LAPAROSCOPIC;  Surgeon: Thresa JAYSON Pulling, MD;  Location: [facility] ORS;  Service: General;  Laterality: N/A;   POLYPECTOMY  11/11/2019   Procedure: POLYPECTOMY;  Surgeon: Shaaron Lamar HERO, MD;  Location: [facility] ENDO SUITE;  Service: Endoscopy;;   TUBAL LIGATION     VIDEO BRONCHOSCOPY  10/18/2017   VIDEO BRONCHOSCOPY WITH ENDOBRONCHIAL NAVIGATION N/A 10/18/2017   Procedure: VIDEO BRONCHOSCOPY WITH ENDOBRONCHIAL NAVIGATION;  Surgeon: Shelah Lamar RAMAN, MD;  Location: [facility] OR;  Service: Thoracic;  Laterality: N/A;   VIDEO BRONCHOSCOPY WITH ENDOBRONCHIAL NAVIGATION Left 11/21/2019   Procedure: VIDEO BRONCHOSCOPY WITH ENDOBRONCHIAL NAVIGATION;  Surgeon: Shelah Lamar RAMAN, MD;  Location: [facility] ENDOSCOPY;  Service: Pulmonary;  Laterality: Left;    SOCIAL HISTORY:  Social History   Socioeconomic History   Marital status: Married    Spouse name: Not on file   Number of  children: 2   Years of education: Not on file   Highest education level: Not on file  Occupational History   Occupation: disabled    Employer: NOT EMPLOYED  Tobacco Use   Smoking status: Every Day    Current packs/day: 0.50    Average packs/day: 0.5 packs/day for 52.5 years (26.2 ttl pk-yrs)    Types: Cigarettes    Start date: 07/14/1971   Smokeless tobacco: Never   Tobacco comments:    0.5 packs of cigarettes smoked daily 10/26/23  Vaping Use   Vaping status: Never Used  Substance and Sexual Activity   Alcohol use: Not Currently    Alcohol/week: 1.0 standard drink of alcohol    Types: 1 Cans of beer per week    Comment: quit June 2012-used to drink 6 pack daily, occasional beer per pt 11/20/19   Drug use: No   Sexual activity: Yes    Birth control/protection: Post-menopausal  Other Topics Concern   Not on file  Social History Narrative   Not on file   Social Drivers of Health   Tobacco Use: High Risk (11/30/2023)   Patient History    Smoking Tobacco Use: Every Day    Smokeless Tobacco Use: Never    Passive Exposure: Not on file  Financial Resource Strain: High Risk (07/12/2023)   Overall Financial Resource Strain (CARDIA)    Difficulty of Paying Living Expenses: Hard  Food Insecurity: No Food Insecurity (10/23/2023)   Epic    Worried About Programme Researcher, Broadcasting/film/video in the Last Year: Never true    [employer] of Food in the Last Year: Never true  Recent Concern: Food Insecurity - Food Insecurity Present (08/01/2023)   Epic    Worried About Programme Researcher, Broadcasting/film/video in the Last Year: Sometimes true    [employer] of Food in the Last Year: Never true  Transportation Needs: No Transportation Needs (11/08/2023)   Epic    Lack of Transportation (Medical): No    Lack of Transportation (Non-Medical): No  Physical Activity: Inactive (07/05/2023)   Exercise Vital Sign    Days of Exercise per Week: 0 days    Minutes of Exercise per Session: 0 min  Stress: No Stress Concern Present (11/08/2023)    [employer] of Occupational Health - Occupational Stress Questionnaire    Feeling of Stress: Only a little  Social Connections: Patient Declined (10/15/2023)   Social Connection and Isolation Panel    Frequency of Communication  with Friends and Family: Patient declined    Frequency of Social Gatherings with Friends and Family: Patient declined    Attends Religious Services: Patient declined    Active Member of Clubs or Organizations: Patient declined    Attends Banker Meetings: Patient declined    Marital Status: Patient declined  Intimate Partner Violence: Not At Risk (10/23/2023)   Epic    Fear of Current or Ex-Partner: No    Emotionally Abused: No    Physically Abused: No    Sexually Abused: No  Depression (PHQ2-9): Low Risk (01/09/2024)   Depression (PHQ2-9)    PHQ-2 Score: 0  Alcohol Screen: Low Risk (07/05/2023)   Alcohol Screen    Last Alcohol Screening Score (AUDIT): 0  Housing: Low Risk (10/23/2023)   Epic    Unable to Pay for Housing in the Last Year: No    Number of Times Moved in the Last Year: 0    Homeless in the Last Year: No  Utilities: Not At Risk (10/23/2023)   Epic    Threatened with loss of utilities: No  Health Literacy: Adequate Health Literacy (07/05/2023)   B1300 Health Literacy    Frequency of need for help with medical instructions: Never    FAMILY HISTORY:  Family History  Problem Relation Age of Onset   Aneurysm Mother    Stroke Father    Aneurysm Father    Hypertension Sister    Cancer Brother        throat cancer   Coronary artery disease Brother    Hypertension Brother    Down syndrome Brother    Hypertension Brother    Hypertension Brother        father/mother   Throat cancer Brother 49       throat cancer   Diabetes Brother    Diabetes Brother    Anesthesia problems Neg Hx    Hypotension Neg Hx    Malignant hyperthermia Neg Hx    Pseudochol deficiency Neg Hx    Colon cancer Neg Hx     CURRENT MEDICATIONS:   Current Outpatient Medications  Medication Sig Dispense Refill   hydrochlorothiazide  (HYDRODIURIL ) 12.5 MG tablet Take 12.5 mg by mouth daily.     Na Sulfate-K Sulfate-Mg Sulfate concentrate (SUPREP) 17.5-3.13-1.6 GM/177ML SOLN As directed 354 mL 0   acetaminophen  (TYLENOL ) 650 MG CR tablet Take 1,300 mg by mouth every 8 (eight) hours as needed for pain.      albuterol  (VENTOLIN  HFA) 108 (90 Base) MCG/ACT inhaler Inhale 1-2 puffs into the lungs every 6 (six) hours as needed for wheezing or shortness of breath. 54 g 3   amLODipine  (NORVASC ) 5 MG tablet TAKE 1 TABLET EVERY DAY 90 tablet 3   atorvastatin  (LIPITOR) 10 MG tablet TAKE 1 TABLET EVERY DAY 90 tablet 3   DULoxetine  (CYMBALTA ) 60 MG capsule TAKE 1 CAPSULE EVERY DAY 90 capsule 3   ferrous sulfate  325 (65 FE) MG EC tablet Take 325 mg by mouth daily with breakfast.     levothyroxine  (SYNTHROID ) 75 MCG tablet TAKE 1 TABLET EVERY OTHER DAY ALTERNATING WITH 1/2 TABLET EVERY OTHER DAY 66 tablet 3   linaclotide  (LINZESS ) 145 MCG CAPS capsule Take 1 capsule (145 mcg total) by mouth daily before breakfast.     magnesium  oxide (MAG-OX) 400 MG tablet Take 1 tablet (400 mg total) by mouth daily. 90 tablet 1   meloxicam  (MOBIC ) 7.5 MG tablet TAKE 1 TABLET EVERY DAY 90 tablet 3   mirtazapine  (  REMERON ) 15 MG tablet Take 1 tablet (15 mg total) by mouth at bedtime. 90 tablet 1   montelukast  (SINGULAIR ) 10 MG tablet TAKE 1 TABLET AT BEDTIME 90 tablet 3   Multiple Vitamin (MULTIVITAMIN WITH MINERALS) TABS tablet Take 1 tablet by mouth daily.     pantoprazole  (PROTONIX ) 40 MG tablet TAKE 1 TABLET TWICE DAILY 180 tablet 3   potassium chloride  (KLOR-CON  M) 10 MEQ tablet Take 1 tablet (10 mEq total) by mouth 2 (two) times daily. 180 tablet 2   No current facility-administered medications for this visit.    ALLERGIES:  Allergies[1]  LABORATORY DATA:  I have reviewed the labs as listed.     Latest Ref Rng & Units 01/02/2024    1:12 PM 10/15/2023    4:27  AM 10/14/2023    8:25 PM  CBC  WBC 4.0 - 10.5 K/uL 12.4  9.9  11.1   Hemoglobin 12.0 - 15.0 g/dL 87.7  88.3  87.8   Hematocrit 36.0 - 46.0 % 37.6  35.7  36.9   Platelets 150 - 400 K/uL 333  310  294       Latest Ref Rng & Units 01/02/2024    1:12 PM 11/03/2023    2:51 PM 10/26/2023    2:35 PM  CMP  Glucose 70 - 99 mg/dL 86  95  895   BUN 8 - 23 mg/dL 6  5  5    Creatinine 0.44 - 1.00 mg/dL 9.12  9.03  9.06   Sodium 135 - 145 mmol/L 144  143  142   Potassium 3.5 - 5.1 mmol/L 2.9  3.8  4.2   Chloride 98 - 111 mmol/L 102  102  102   CO2 22 - 32 mmol/L 25  26  24    Calcium  8.9 - 10.3 mg/dL 8.8  8.9  9.8   Total Protein 6.5 - 8.1 g/dL 7.6     Total Bilirubin 0.0 - 1.2 mg/dL 0.2     Alkaline Phos 38 - 126 U/L 138     AST 15 - 41 U/L 26     ALT 0 - 44 U/L 10       DIAGNOSTIC IMAGING:  I have independently reviewed the scans and discussed with the patient. CT CHEST W CONTRAST Result Date: 01/03/2024 CLINICAL DATA:  No small cell lung cancer.  * Tracking Code: BO * EXAM: CT CHEST WITH CONTRAST TECHNIQUE: Multidetector CT imaging of the chest was performed during intravenous contrast administration. RADIATION DOSE REDUCTION: This exam was performed according to the departmental dose-optimization program which includes automated exposure control, adjustment of the mA and/or kV according to patient size and/or use of iterative reconstruction technique. CONTRAST:  80mL OMNIPAQUE  IOHEXOL  300 MG/ML  SOLN COMPARISON:  07/03/2023. FINDINGS: Cardiovascular: Enlarged pulmonic trunk and heart. No pericardial effusion. Mediastinum/Nodes: No pathologically enlarged mediastinal, hilar or axillary lymph nodes. Air and food debris in a dilated esophagus. Lungs/Pleura: Centrilobular and paraseptal emphysema. Smoking related respiratory bronchiolitis likely accounts for areas of ground-glass in the upper lobes. Compensatory hypertrophy of the right lung. Scattered areas of consolidation and nodularity in the left  upper lobe with index nodule in the anterior segment measuring 8 x 13 mm (5/55), stable. Left lower lobe nodule measures 1.4 cm (1.3 x 1.4 cm, 5/94), stable, with surrounding parenchymal retraction, bronchiectasis and architectural distortion. 6 mm anterior segment right upper lobe nodule (5/37), stable. No pleural fluid. Airway is unremarkable. Upper Abdomen: Gastric wall thickening. Visualized portions of the liver, gallbladder,  adrenal glands, kidneys, spleen, pancreas, stomach and bowel are otherwise grossly unremarkable. No upper abdominal adenopathy. Musculoskeletal: Degenerative changes in the spine. Osteopenia. Nondisplaced left seventh posterolateral rib fracture with an associated lucency (3/83). Lucency and irregularity involving the posterolateral left eighth rib (3/88). These are new from 07/03/2023. IMPRESSION: 1. Stable left lower lobe nodule with surrounding post radiation scarring. 2. Areas of nodular consolidation in the left upper lobe and 6 mm anterior segment right upper lobe nodule, stable. 3. Areas of focal ground-glass in the right upper lobe are new and may be due to smoking related respiratory bronchiolitis. Recommend attention on follow-up. 4. New posterolateral left seventh and eighth rib fractures, possibly treatment related. Difficult to exclude pathologic fractures. 5. Air and food debris in a dilated esophagus suggest dysmotility. Distal esophageal stricture cannot be definitively excluded. 6. Gastric wall thickening. 7. Enlarged pulmonic trunk, indicative of pulmonary arterial hypertension. 8.  Emphysema (ICD10-J43.9). Electronically Signed   By: Newell Eke M.D.   On: 01/03/2024 08:23     WRAP UP:  All questions were answered. The patient knows to call the clinic with any problems, questions or concerns.  Medical decision making: Moderate  Time spent on visit: I spent 20 minutes counseling the patient face to face. The total time spent in the appointment was 30 minutes  and more than 50% was on counseling.  Pleasant CHRISTELLA Barefoot, PA-C  01/09/2024 2:14 PM      [1]  Allergies Allergen Reactions   Aspirin  Other (See Comments)    Nose bleeds   "

## 2024-01-09 ENCOUNTER — Inpatient Hospital Stay: Admitting: Physician Assistant

## 2024-01-09 VITALS — BP 143/78 | HR 109 | Temp 98.1°F | Resp 19 | Ht 68.0 in | Wt 156.0 lb

## 2024-01-09 DIAGNOSIS — J449 Chronic obstructive pulmonary disease, unspecified: Secondary | ICD-10-CM

## 2024-01-09 DIAGNOSIS — I2723 Pulmonary hypertension due to lung diseases and hypoxia: Secondary | ICD-10-CM

## 2024-01-09 DIAGNOSIS — D508 Other iron deficiency anemias: Secondary | ICD-10-CM

## 2024-01-09 DIAGNOSIS — Z85118 Personal history of other malignant neoplasm of bronchus and lung: Secondary | ICD-10-CM | POA: Diagnosis not present

## 2024-01-09 DIAGNOSIS — C3492 Malignant neoplasm of unspecified part of left bronchus or lung: Secondary | ICD-10-CM

## 2024-01-09 NOTE — Patient Instructions (Signed)
 [facility] at [facility] **VISIT SUMMARY & IMPORTANT INSTRUCTIONS **   You were seen today by Pleasant Barefoot PA-C for your history of lung cancer.   Your most recent CT scan did not show any evidence of recurrent lung cancer at this time. We will recheck CT scan and see you for office visit in 6 months.  OTHER ISSUES: The CT scan of your chest showed COPD/emphysema and inflammation in your lungs.  We will refer you back to your lung doctor (Dr. Shelah) for further management of your lung disease. Continue taking iron tablet daily.  Your blood and iron levels currently look great!  FOLLOW-UP APPOINTMENT: 6 months  ** Thank you for trusting me with your healthcare!  I strive to provide all of my patients with quality care at each visit.  If you receive a survey for this visit, I would be so grateful to you for taking the time to provide feedback.  Thank you in advance!  ~ Lynnet Hefley                                        Dr. Mickiel Davonna Pleasant Barefoot, PA-C          Delon Hope, NP   - - - - - - - - - - - - - - - - - -    Thank you for choosing  Cancer Center at [facility] to provide your oncology and hematology care.  To afford each patient quality time with our provider, please arrive at least 15 minutes before your scheduled appointment time.   If you have a lab appointment with the Cancer Center please come in thru the Main Entrance and check in at the main information desk.  You need to re-schedule your appointment should you arrive 10 or more minutes late.  We strive to give you quality time with our providers, and arriving late affects you and other patients whose appointments are after yours.  Also, if you no show three or more times for appointments you may be dismissed from the clinic at the providers discretion.     Again, thank you for choosing [facility].  Our hope is that these requests will decrease the  amount of time that you wait before being seen by our physicians.       _____________________________________________________________  Should you have questions after your visit to [facility], please contact our office at [phone] and follow the prompts.  Our office hours are 8:00 a.m. and 4:30 p.m. Monday - Friday.  Please note that voicemails left after 4:00 p.m. may not be returned until the following business day.  We are closed weekends and major holidays.  You do have access to a nurse 24-7, just call the main number to the clinic [phone] and do not press any options, hold on the line and a nurse will answer the phone.    For prescription refill requests, have your pharmacy contact our office and allow 72 hours.

## 2024-01-15 ENCOUNTER — Encounter: Payer: Self-pay | Admitting: *Deleted

## 2024-01-17 ENCOUNTER — Ambulatory Visit: Admitting: Gastroenterology

## 2024-01-17 ENCOUNTER — Encounter: Payer: Self-pay | Admitting: Gastroenterology

## 2024-01-17 ENCOUNTER — Telehealth: Payer: Self-pay | Admitting: *Deleted

## 2024-01-17 VITALS — BP 136/73 | HR 112 | Temp 98.3°F | Ht 68.0 in | Wt 154.4 lb

## 2024-01-17 DIAGNOSIS — K219 Gastro-esophageal reflux disease without esophagitis: Secondary | ICD-10-CM

## 2024-01-17 DIAGNOSIS — K2100 Gastro-esophageal reflux disease with esophagitis, without bleeding: Secondary | ICD-10-CM | POA: Diagnosis not present

## 2024-01-17 DIAGNOSIS — R634 Abnormal weight loss: Secondary | ICD-10-CM | POA: Diagnosis not present

## 2024-01-17 DIAGNOSIS — K5909 Other constipation: Secondary | ICD-10-CM

## 2024-01-17 DIAGNOSIS — D509 Iron deficiency anemia, unspecified: Secondary | ICD-10-CM

## 2024-01-17 DIAGNOSIS — K2210 Ulcer of esophagus without bleeding: Secondary | ICD-10-CM

## 2024-01-17 DIAGNOSIS — K209 Esophagitis, unspecified without bleeding: Secondary | ICD-10-CM

## 2024-01-17 DIAGNOSIS — R630 Anorexia: Secondary | ICD-10-CM | POA: Diagnosis not present

## 2024-01-17 MED ORDER — VOQUEZNA 20 MG PO TABS: 20.0000 mg | ORAL_TABLET | Freq: Every day | ORAL | Status: AC

## 2024-01-17 NOTE — Telephone Encounter (Signed)
 Medication Samples have been provided to the patient.  Drug name: Voquezna       Strength: 20mg         Qty: 4  LOT: [id number]  Exp.Date: 10/26  Dosing instructions: Take 1 daily   The patient has been instructed regarding the correct time, dose, and frequency of taking this medication, including desired effects and most common side effects.   Kelly Douglas 10:15 AM 01/17/2024

## 2024-01-17 NOTE — Progress Notes (Signed)
 "  GI Office Note    Referring Provider: Antonetta Rollene BRAVO, MD Primary Care Physician:  Antonetta Rollene BRAVO, MD Primary Gastroenterologist: Lamar HERO.Rourk, MD  Date:  01/17/2024  ID:  Kelly Douglas, DOB May 20, 1955, MRN [id number]   Chief Complaint   Chief Complaint  Patient presents with   Follow-up    Follow up. No problems    History of Present Illness  Kelly Douglas is a 68 y.o. female with a history of COPD, anxiety, depression, GERD, HLD, HTN, hypothyroidism, scleroderma, chronic lung disease, left lung adenocarcinoma s/p XRT in 2023, and history of adenomatous colon polyps presenting today for follow-up post procedures.  Colonoscopy October 2021: -polyp in the rectum (tubular adenoma) -Sigmoid and descending diverticulosis -Advised repeat in 5 years, notify office if any rectal bleeding, abdominal pain, or significant change in bowel habits.   OV 03/29/2022 for change in stools.  Patient reported mucus in her stools with little balls that was ongoing for about 6 months.  Denies any melena or BRBPR.  Reported she did not have a normal bowel movement in 2 weeks and stated that it was a small amount when she did go.  Only having little balls of stool despite her urgency to have a bowel movement.  Also with some periumbilical pain and cramping that occurs about 3 times per month but states this is chronic for the last 40 years.  Denied any nausea or overt vomiting.  Does have some occasional regurgitation of acid with her reflux issues gets her heartburn medication for 2 days.  Mostly takes pantoprazole  once daily, sometimes twice daily.  Advised to start MiraLAX  17 g once daily and fiber supplement daily as well as increase fiber in diet.  Advised to use Gas-X or Beano for gas pains.  Continue pantoprazole  40 mg once to twice daily.  Consideration for colonoscopy if no improvement with constipation or if she develops any alarm symptoms.   Last office visit 05/30/22.Continues with  constipation even with miralax  3 times weekly. Tries to follow high fiber diet. No melena, brbpr. Taking pantoprazole  once daily, sometimes twice daily and famotidine  at bedtime. Advised miralax  daily and fiber supplement. Continue daily PPI with additional dose as needed and famotidine  at bedtime.    Recent hospital admission 9/27-9/28 for fatigue and weakness and decreased appetite. Had significant hypomagnesemia (<0.5) and hypokalemia (2.8) with hydrochlorothiazide  use. Noted improvement in symptoms and reported appetite returning as well with electrolyte replacement. Underwent CT scan of the abdomen and pelvis with no acute findings in the abdomen and pelvis and linear ground glass opacities in the left lower lobe.   Last office visit 10/17/2023.  Reported unintentional weight loss over the past month and a half given decreased appetite.  At times can go 2 days without eating and struggles to consume more than a couple of bites.  Denied nausea vomiting or dysphagia.  Having occasional heart palpitations and headaches.  She did report a history of lung cancer diagnosed 2 years prior and she received radiation therapy and has been having ongoing follow-ups.  History of scleroderma which causes darkness of her skin.  She does report some occasional jitteriness and swelling in her feet and had been had some episodes of hypokalemia and hypomagnesemia and she had blood pressure change.  She does report a lot of grief given the loss of her siblings in the last 4 years and has been dealing with her husband who has early stages of dementia.  She does take  iron supplementation as well.  She also reported issues with constipation and Bristol 1 stools and had tried MiraLAX  and Benefiber which have been ineffective and will at times drink less and some else to help induce diarrhea.  Given her weight loss she was scheduled for EGD and colonoscopy.  For constipation I gave her samples of Linzess  and advised her to continue  her daily iron supplementation and continue her follow-up with oncology for her lung cancer.  Colonoscopy 11/30/2023: - Diverticulosis in the sigmoid colon and in the descending colon.  - Redundant colon.  - The examination was otherwise normal on direct and retroflexion views.  - Normal- appearing terminal ileum  - No specimens collected. - Repeat colonoscopy in 7 years  EGD 11/30/2023: - Ulcerated esophagus. Status post biopsy.  - Small hiatal hernia.  - gastric mucosa consistent with portal hypertensive gastropathy? status post biopsy  - Normal duodenal bulb and second portion of the duodenum. - Path: Stomach biopsy with hyperemia, negative H. pylori.  Esophageal biopsy with squamous mucosa with mild reflux changes, negative for EOE. - She was advised to take Voquezna 20 mg daily given pantoprazole  did not seem to be effective given her EGD findings.     Latest Ref Rng & Units 01/02/2024    1:12 PM 10/15/2023    4:27 AM 10/14/2023    8:25 PM  CBC  WBC 4.0 - 10.5 K/uL 12.4  9.9  11.1   Hemoglobin 12.0 - 15.0 g/dL 87.7  88.3  87.8   Hematocrit 36.0 - 46.0 % 37.6  35.7  36.9   Platelets 150 - 400 K/uL 333  310  294     Iron/TIBC/Ferritin/ %Sat    Component Value Date/Time   IRON 45 01/02/2024 1311   TIBC 339 01/02/2024 1311   FERRITIN 124 01/02/2024 1311   IRONPCTSAT 13 01/02/2024 1311   IRONPCTSAT 27 02/01/2013 0806   Today:  Discussed the use of AI scribe software for clinical note transcription with the patient, who gave verbal consent to proceed.  She reports taking pantoprazole  once daily, sometimes twice daily for breakthrough symptoms, and is awaiting initiation of Voquezna. She previously described chest discomfort resembling a heart attack, and currently reports heartburn, sometimes accompanied by nausea and regurgitation of saliva. She reports that eating previously caused significant discomfort, poor appetite, and weight loss, but her appetite and oral intake are  now better.  She also reports that she is finally sleeping better.  She reports that her bowel function is better since her recent colonoscopy. She discontinued Linzess , fiber, and Miralax , and reports having regular bowel movements without straining or abdominal pain. She reports that her stool caliber is larger than previously. She denies current abdominal pain or constipation symptoms.  She was recently hospitalized for hypokalemia and hypomagnesemia previously.  She reports that given she is eating better this has not been as much of an issue.  She has taking her iron daily.     Wt Readings from Last 6 Encounters:  01/17/24 154 lb 6.4 oz (70 kg)  01/09/24 156 lb (70.8 kg)  11/30/23 155 lb (70.3 kg)  10/26/23 153 lb 6.4 oz (69.6 kg)  10/17/23 154 lb 12.8 oz (70.2 kg)  10/15/23 148 lb 14.4 oz (67.5 kg)    Body mass index is 23.48 kg/m.   Current Outpatient Medications  Medication Sig Dispense Refill   Na Sulfate-K Sulfate-Mg Sulfate concentrate (SUPREP) 17.5-3.13-1.6 GM/177ML SOLN As directed 354 mL 0   acetaminophen  (TYLENOL ) 650 MG  CR tablet Take 1,300 mg by mouth every 8 (eight) hours as needed for pain.      albuterol  (VENTOLIN  HFA) 108 (90 Base) MCG/ACT inhaler Inhale 1-2 puffs into the lungs every 6 (six) hours as needed for wheezing or shortness of breath. 54 g 3   amLODipine  (NORVASC ) 5 MG tablet TAKE 1 TABLET EVERY DAY 90 tablet 3   atorvastatin  (LIPITOR) 10 MG tablet TAKE 1 TABLET EVERY DAY 90 tablet 3   DULoxetine  (CYMBALTA ) 60 MG capsule TAKE 1 CAPSULE EVERY DAY 90 capsule 3   ferrous sulfate  325 (65 FE) MG EC tablet Take 325 mg by mouth daily with breakfast.     hydrochlorothiazide  (HYDRODIURIL ) 12.5 MG tablet Take 12.5 mg by mouth daily.     levothyroxine  (SYNTHROID ) 75 MCG tablet TAKE 1 TABLET EVERY OTHER DAY ALTERNATING WITH 1/2 TABLET EVERY OTHER DAY 66 tablet 3   linaclotide  (LINZESS ) 145 MCG CAPS capsule Take 1 capsule (145 mcg total) by mouth daily before  breakfast.     magnesium  oxide (MAG-OX) 400 MG tablet Take 1 tablet (400 mg total) by mouth daily. 90 tablet 1   meloxicam  (MOBIC ) 7.5 MG tablet TAKE 1 TABLET EVERY DAY 90 tablet 3   mirtazapine  (REMERON ) 15 MG tablet Take 1 tablet (15 mg total) by mouth at bedtime. 90 tablet 1   montelukast  (SINGULAIR ) 10 MG tablet TAKE 1 TABLET AT BEDTIME 90 tablet 3   Multiple Vitamin (MULTIVITAMIN WITH MINERALS) TABS tablet Take 1 tablet by mouth daily.     pantoprazole  (PROTONIX ) 40 MG tablet TAKE 1 TABLET TWICE DAILY 180 tablet 3   potassium chloride  (KLOR-CON  M) 10 MEQ tablet Take 1 tablet (10 mEq total) by mouth 2 (two) times daily. 180 tablet 2   No current facility-administered medications for this visit.    Past Medical History:  Diagnosis Date   Anxiety    Arteriosclerotic cardiovascular disease (ASCVD) 2013   coronary calcification-left main, LAD and CX; small pericardial effusion   Arthritis    hips   Cancer (HCC)    Lung Cancer    Chronic back pain    Chronic bronchitis    Chronic lung disease    Chronic scarring and volume loss-left lung; characteristics of a chronic infectious process-possible MAI   Depression    Dyspnea    occasional    GERD (gastroesophageal reflux disease)    Dr Shaaron EGD 09/2009->esophagitis, sm HH, antral erosions, atonic esophagus   History of radiation therapy    Left and Right Lung- 11/24/21-12/07/21- Dr. Lynwood Nasuti   Hyperlipidemia    Hypertension    Hypothyroid 1981 approx   Scleroderma (HCC)    Seasonal allergies    Syncope    Multiple spells over the past 40+ years, likely neurocardiogenic   Tobacco abuse 06/24/2009    Past Surgical History:  Procedure Laterality Date   BILATERAL SALPINGOOPHORECTOMY  2013    Dr. Jayne; uterus remains in situ   BRONCHIAL BIOPSY  11/21/2019   Procedure: BRONCHIAL BIOPSIES;  Surgeon: Shelah Lamar RAMAN, MD;  Location: [facility] ENDOSCOPY;  Service: Pulmonary;;   BRONCHIAL BRUSHINGS  11/21/2019   Procedure: BRONCHIAL  BRUSHINGS;  Surgeon: Shelah Lamar RAMAN, MD;  Location: [facility] ENDOSCOPY;  Service: Pulmonary;;   BRONCHIAL NEEDLE ASPIRATION BIOPSY  11/21/2019   Procedure: BRONCHIAL NEEDLE ASPIRATION BIOPSIES;  Surgeon: Shelah Lamar RAMAN, MD;  Location: [facility] ENDOSCOPY;  Service: Pulmonary;;   BRONCHIAL WASHINGS  11/21/2019   Procedure: BRONCHIAL WASHINGS;  Surgeon: Shelah Lamar RAMAN, MD;  Location: [facility] ENDOSCOPY;  Service: Pulmonary;;   COLONOSCOPY  09/28/09   anal papilla otherwise normal   COLONOSCOPY N/A 08/29/2012   MFM:Rnonwpr polyp-removed as described above. tubular adenoma   COLONOSCOPY N/A 11/30/2023   Procedure: COLONOSCOPY;  Surgeon: Shaaron Lamar HERO, MD;  Location: [facility] ENDO SUITE;  Service: Endoscopy;  Laterality: N/A;  10:30 am, ok rm 1   COLONOSCOPY WITH PROPOFOL  N/A 11/11/2019   Procedure: COLONOSCOPY WITH PROPOFOL ;  Surgeon: Shaaron Lamar HERO, MD;  Location: [facility] ENDO SUITE;  Service: Endoscopy;  Laterality: N/A;  2:30pm   ESOPHAGOGASTRODUODENOSCOPY  10/07/09   Dr. Verneice of esophageal mucosa, diffusely ?esophagitis (bx benign), small HH/antral erosions, erythema bx benign. 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 Hiatal hernia   FUDUCIAL PLACEMENT  10/18/2017   Procedure: PLACEMENT OF 3 FUDUCIAL INTO LEFT LOWER LOBE OF LUNG-TARGET 1;  Surgeon: Shelah Lamar RAMAN, MD;  Location: [facility] OR;  Service: Thoracic;;   LAPAROSCOPIC APPENDECTOMY  07/16/2011   Procedure: APPENDECTOMY LAPAROSCOPIC;  Surgeon: Thresa JAYSON Pulling, MD;  Location: [facility] ORS;  Service: General;  Laterality: N/A;   POLYPECTOMY  11/11/2019   Procedure: POLYPECTOMY;  Surgeon: Shaaron Lamar HERO,  MD;  Location: [facility] ENDO SUITE;  Service: Endoscopy;;   TUBAL LIGATION     VIDEO BRONCHOSCOPY  10/18/2017   VIDEO BRONCHOSCOPY WITH ENDOBRONCHIAL NAVIGATION N/A 10/18/2017   Procedure: VIDEO BRONCHOSCOPY WITH ENDOBRONCHIAL NAVIGATION;  Surgeon: Shelah Lamar RAMAN, MD;  Location: [facility] OR;  Service: Thoracic;  Laterality: N/A;   VIDEO BRONCHOSCOPY WITH ENDOBRONCHIAL NAVIGATION Left 11/21/2019   Procedure: VIDEO BRONCHOSCOPY WITH ENDOBRONCHIAL NAVIGATION;  Surgeon: Shelah Lamar RAMAN, MD;  Location: [facility] ENDOSCOPY;  Service: Pulmonary;  Laterality: Left;    Family History  Problem Relation Age of Onset   Aneurysm Mother    Stroke Father    Aneurysm Father    Hypertension Sister    Cancer Brother        throat cancer   Coronary artery disease Brother    Hypertension Brother    Down syndrome Brother    Hypertension Brother    Hypertension Brother        father/mother   Throat cancer Brother 49       throat cancer   Diabetes Brother    Diabetes Brother    Anesthesia problems Neg Hx    Hypotension Neg Hx    Malignant hyperthermia Neg Hx    Pseudochol deficiency Neg Hx    Colon cancer Neg Hx     Allergies as of 01/17/2024 - Review Complete 01/09/2024  Allergen Reaction Noted   Aspirin  Other (See Comments) 05/19/2015    Social History   Socioeconomic History   Marital status: Married    Spouse name: Not on file   Number of children: 2   Years of education: Not on file   Highest education level: Not on file  Occupational History   Occupation: disabled    Employer: NOT EMPLOYED  Tobacco Use   Smoking status: Every Day    Current packs/day: 0.50    Average packs/day: 0.5 packs/day for 52.5 years (26.3 ttl pk-yrs)    Types: Cigarettes    Start date: 07/14/1971   Smokeless tobacco: Never   Tobacco comments:  0.5 packs of cigarettes smoked daily 10/26/23  Vaping Use   Vaping status: Never Used  Substance and Sexual Activity   Alcohol use: Not Currently    Alcohol/week: 1.0 standard  drink of alcohol    Types: 1 Cans of beer per week    Comment: quit June 2012-used to drink 6 pack daily, occasional beer per pt 11/20/19   Drug use: No   Sexual activity: Yes    Birth control/protection: Post-menopausal  Other Topics Concern   Not on file  Social History Narrative   Not on file   Social Drivers of Health   Tobacco Use: High Risk (01/17/2024)   Patient History    Smoking Tobacco Use: Every Day    Smokeless Tobacco Use: Never    Passive Exposure: Not on file  Financial Resource Strain: High Risk (07/12/2023)   Overall Financial Resource Strain (CARDIA)    Difficulty of Paying Living Expenses: Hard  Food Insecurity: No Food Insecurity (10/23/2023)   Epic    Worried About Programme Researcher, Broadcasting/film/video in the Last Year: Never true    Ran Out of Food in the Last Year: Never true  Recent Concern: Food Insecurity - Food Insecurity Present (08/01/2023)   Epic    Worried About Programme Researcher, Broadcasting/film/video in the Last Year: Sometimes true    Ran Out of Food in the Last Year: Never true  Transportation Needs: No Transportation Needs (11/08/2023)   Epic    Lack of Transportation (Medical): No    Lack of Transportation (Non-Medical): No  Physical Activity: Inactive (07/05/2023)   Exercise Vital Sign    Days of Exercise per Week: 0 days    Minutes of Exercise per Session: 0 min  Stress: No Stress Concern Present (11/08/2023)   [employer] of Occupational Health - Occupational Stress Questionnaire    Feeling of Stress: Only a little  Social Connections: Patient Declined (10/15/2023)   Social Connection and Isolation Panel    Frequency of Communication with Friends and Family: Patient declined    Frequency of Social Gatherings with Friends and Family: Patient declined    Attends Religious Services: Patient declined    Active Member of Clubs or Organizations: Patient declined    Attends Banker Meetings: Patient declined    Marital Status: Patient declined  Depression  (PHQ2-9): Low Risk (01/09/2024)   Depression (PHQ2-9)    PHQ-2 Score: 0  Alcohol Screen: Low Risk (07/05/2023)   Alcohol Screen    Last Alcohol Screening Score (AUDIT): 0  Housing: Low Risk (10/23/2023)   Epic    Unable to Pay for Housing in the Last Year: No    Number of Times Moved in the Last Year: 0    Homeless in the Last Year: No  Utilities: Not At Risk (10/23/2023)   Epic    Threatened with loss of utilities: No  Health Literacy: Adequate Health Literacy (07/05/2023)   B1300 Health Literacy    Frequency of need for help with medical instructions: Never    Review of Systems   Gen: + fatigue. Denies fever, chills, anorexia. Denies weakness, weight loss.  CV: Denies chest pain, palpitations, syncope, peripheral edema, and claudication. Resp: Denies dyspnea at rest, cough, wheezing, coughing up blood, and pleurisy. GI: See HPI Derm: Denies rash, itching, dry skin Psych: + anxiety. Denies depression, memory loss, confusion. No homicidal or suicidal ideation.  Heme: Denies bruising, bleeding, and enlarged lymph nodes.  Physical Exam   BP 136/73 (BP Location: Right  Arm, Patient Position: Sitting, Cuff Size: Normal)   Pulse (!) 112   Temp 98.3 F (36.8 C) (Temporal)   Ht 5' 8 (1.727 m)   Wt 154 lb 6.4 oz (70 kg)   LMP  (LMP Unknown)   BMI 23.48 kg/m   General:   Alert and oriented. No distress noted. Pleasant and cooperative.  Head:  Normocephalic and atraumatic. Eyes:  Conjuctiva clear without scleral icterus. Mouth:  Oral mucosa pink and moist. Poor dentition. No lesions. Abdomen:  +BS, soft,  non-distended. Mild ttp to umbilical region and epigastrium. No rebound or guarding. No HSM or masses noted. Rectal: deferred Msk:  Symmetrical without gross deformities. Normal posture. Neurologic:  Alert and  oriented x4 Psych:  Alert and cooperative. Normal mood and affect.  Assessment & Plan  Kelly Douglas is a 68 y.o. female presenting today for follow up of appetite,  weight loss, constipation, and GERD.   Gastroesophageal reflux disease with esophagitis and esophageal ulceration Severe, chronic gastroesophageal reflux disease with persistent symptoms despite prior therapy. Pathology confirmed severe reflux changes without malignancy. Prior lung radiation may contribute to ongoing esophageal inflammation. Anticipated improvement with Voquezna, expected to promote mucosal healing and reduce acid-related injury.  For now has been taking pantoprazole  40 mg once daily given she did not receive a prescription after her endoscopy.  She occasionally takes an additional dose in the evenings if needed. - Provided Voquezna milligram samples and initiated once daily therapy. - Sent prescription for Voquezna 20 mg once daily to [facility] for insurance approval. - Instructed her to continue pantoprazole  once daily until Voquezna is obtained if she runs out of the samples; if significant breakthrough symptoms occur, increase pantoprazole  to twice daily. - Advised use of Tums, Maalox, or Mylanta as needed for breakthrough symptoms; instructed her to avoid Pepto due to risk of constipation. - If insurance denies Voquezna, discussed plan to use a specialty pharmacy ([facility]) for reduced cost access. - GERD diet  Chronic constipation Chronic constipation is well-controlled since colonoscopy, with normal bowel movements and absence of abdominal pain or straining.  She did take the Linzess  samples previously into her which were effective.  Since her colonoscopy she has stopped the fiber and MiraLAX  given she is going much better on her own and having Bristol 4 stools. - Advised resuming fiber and Miralax  if constipation recurs or bowel movements become less frequent. - Discussed restarting Linzess  if constipation becomes severe. - Instructed her to avoid Pepto to prevent constipation. - Reinforced importance of monitoring bowel habits and reporting any changes.  Weight loss, lack of  appetite Previous weight loss likely secondary to severe esophagitis and ulceration. Appetite has improved and she is regaining weight.  Her weight has been stable per my review of her weights -Monitor for worsening decrease in appetite and weight loss.  Iron deficiency anemia Most recent labs in December with hemoglobin stable in the 12 range.  Has continued to take her iron once daily and her most recent iron labs are stable as well.  Prior anemia could have been secondary to severe esophageal ulceration as well as her prior lung cancer.  She remains without any signs of GI bleeding.  Her EGD and colonoscopy were recently performed.  No findings on colonoscopy to indicate cause of anemia. - Continue oral iron once daily. - Monitor for signs of GI bleeding     Follow up   Follow up 3 months.     Charmaine Melia, MSN, FNP-BC, AGACNP-BC [facility] "

## 2024-01-17 NOTE — Addendum Note (Signed)
 Addended by: Xianna Siverling L on: 01/17/2024 10:11 AM   Modules accepted: Orders

## 2024-01-17 NOTE — Patient Instructions (Addendum)
 Start Voquezna 20 mg once daily, 30 minutes prior to breakfast.  I will provide you some  samples today and we will go ahead and send in the prescription to the [facility] pharmacy for you.  As we discussed if they deny this request then I will send to our specialty pharmacy for the Voquezna to try to get this at a reduced cost for you  I would like for you to continue taking the pantoprazole  once daily and an additional dose in the afternoon if needed if you run out of the Voquezna and have not yet gotten a prescription.  Please do not take Voquezna and pantoprazole  together.  Follow a GERD diet:  Avoid fried, fatty, greasy, spicy, citrus foods. Avoid caffeine and carbonated beverages. Avoid chocolate. Try eating 4-6 small meals a day rather than 3 large meals. Do not eat within 3 hours of laying down. Prop head of bed up on wood or bricks to create a 6 inch incline.  Continue to monitor your weight and let me know if you begin to lose again.  Continue your iron once daily.  If you begin to have worsening constipation again and less frequent stools then please resume your fiber supplementation and MiraLAX .  I am glad to hear and see that you are doing better!  Happy new year!  I will see you in 3 months.  It was a pleasure to see you today. I want to create trusting relationships with patients. If you receive a survey regarding your visit,  I greatly appreciate you taking time to fill this out on paper or through your MyChart. I value your feedback.  Charmaine Melia, MSN, FNP-BC, AGACNP-BC [facility]

## 2024-01-23 ENCOUNTER — Encounter: Payer: Self-pay | Admitting: General Practice

## 2024-01-24 ENCOUNTER — Ambulatory Visit: Admitting: Family Medicine

## 2024-01-24 ENCOUNTER — Other Ambulatory Visit (HOSPITAL_COMMUNITY): Payer: Self-pay | Admitting: Family Medicine

## 2024-01-24 ENCOUNTER — Encounter: Payer: Self-pay | Admitting: Family Medicine

## 2024-01-24 VITALS — BP 124/80 | HR 92 | Resp 12 | Ht 68.0 in | Wt 154.0 lb

## 2024-01-24 DIAGNOSIS — Z23 Encounter for immunization: Secondary | ICD-10-CM

## 2024-01-24 DIAGNOSIS — F17219 Nicotine dependence, cigarettes, with unspecified nicotine-induced disorders: Secondary | ICD-10-CM | POA: Diagnosis not present

## 2024-01-24 DIAGNOSIS — E559 Vitamin D deficiency, unspecified: Secondary | ICD-10-CM

## 2024-01-24 DIAGNOSIS — E782 Mixed hyperlipidemia: Secondary | ICD-10-CM

## 2024-01-24 DIAGNOSIS — I10 Essential (primary) hypertension: Secondary | ICD-10-CM

## 2024-01-24 DIAGNOSIS — Z0001 Encounter for general adult medical examination with abnormal findings: Secondary | ICD-10-CM | POA: Diagnosis not present

## 2024-01-24 DIAGNOSIS — Z1231 Encounter for screening mammogram for malignant neoplasm of breast: Secondary | ICD-10-CM

## 2024-01-24 DIAGNOSIS — E038 Other specified hypothyroidism: Secondary | ICD-10-CM | POA: Diagnosis not present

## 2024-01-24 NOTE — Patient Instructions (Addendum)
 F./U end August , flu vaccine at visit  Pneumonia vaccine in office today  Fasting lipid, cmp and EGFr, TSH and free T4, vit D and magnesium  this Friday  Please schedule July mammogram at checkout  Annual with Dr Tobie   in 1 year  Recommend TdAP and shingles vaccines and are at your pharmacy  Please make up your mind to stop smoking  The  medication for allergies montelukast  is removed from your list bUT if you start having allergy symptoms , please start taking it regularly  Thanks for choosing [facility], we consider it a privelige to serve you.

## 2024-01-24 NOTE — Progress Notes (Signed)
" ° ° °  Kelly Douglas     MRN: [id number]      DOB: 1955-12-04  Chief Complaint  Patient presents with   Annual Exam    Cpe     HPI: Patient is in for annual physical exam. Immunization is reviewed , and  updated if needed.   PE: BP 124/80   Pulse 92   Resp 12   Ht 5' 8 (1.727 m)   Wt 154 lb (69.9 kg)   LMP  (LMP Unknown)   SpO2 94%   BMI 23.42 kg/m   BP 124/80   Pulse 92   Resp 12   Ht 5' 8 (1.727 m)   Wt 154 lb (69.9 kg)   LMP  (LMP Unknown)   SpO2 94%   BMI 23.42 kg/m    Pleasant  female, alert and oriented x 3, in no cardio-pulmonary distress. Afebrile. HEENT No facial trauma or asymetry. Sinuses non tender.  Extra occullar muscles intact.. External ears normal, . Neck: supple, no adenopathy,JVD or thyromegaly.No bruits.  Chest: Clear to ascultation bilaterally.No crackles or wheezes. Non tender to palpation  Cardiovascular system; Heart sounds normal,  S1 and  S2 ,no S3.  No murmur, or thrill. Apical beat not displaced Peripheral pulses normal.  Abdomen: Soft, non tender, no organomegaly or masses. No bruits. Bowel sounds normal. No guarding, tenderness or rebound.    Musculoskeletal exam: Full ROM of spine, hips , shoulders and knees. No deformity ,swelling or crepitus noted. No muscle wasting or atrophy.   Neurologic: Cranial nerves 2 to 12 intact. Power, tone ,sensation normal throughout. No disturbance in gait. No tremor.  Skin: Intact, no ulceration, erythema , scaling or rash noted. Pigmentation normal throughout  Psych; Normal mood and affect. Judgement and concentration normal   Assessment & Plan:  Encounter for Medicare annual examination with abnormal findings Annual exam as documented. Immunization and cancer screening needs are specifically addressed at this visit.   Nicotine  dependence Asked:confirms currently smokes cigarettes Assess: Unwilling to set a quit date, but is cutting back Advise: needs to QUIT to  reduce risk of cancer, cardio and cerebrovascular disease Assist: counseled for 5 minutes and literature provided Arrange: follow up in 2 to 4 months   Immunization due After obtaining informed consent, the  pneumonia vaccine is  administered , with no adverse effect noted at the time of administration.  "

## 2024-01-27 ENCOUNTER — Observation Stay (HOSPITAL_COMMUNITY)
Admission: EM | Admit: 2024-01-27 | Discharge: 2024-01-28 | Disposition: A | Discharge status: Home / Self Care | Attending: Family Medicine | Admitting: Family Medicine

## 2024-01-27 ENCOUNTER — Encounter (HOSPITAL_COMMUNITY): Payer: Self-pay

## 2024-01-27 ENCOUNTER — Ambulatory Visit: Payer: Self-pay | Admitting: Family Medicine

## 2024-01-27 ENCOUNTER — Other Ambulatory Visit: Payer: Self-pay

## 2024-01-27 DIAGNOSIS — I2510 Atherosclerotic heart disease of native coronary artery without angina pectoris: Secondary | ICD-10-CM | POA: Diagnosis not present

## 2024-01-27 DIAGNOSIS — I10 Essential (primary) hypertension: Secondary | ICD-10-CM | POA: Diagnosis not present

## 2024-01-27 DIAGNOSIS — F32A Depression, unspecified: Secondary | ICD-10-CM | POA: Diagnosis not present

## 2024-01-27 DIAGNOSIS — Z79899 Other long term (current) drug therapy: Secondary | ICD-10-CM | POA: Insufficient documentation

## 2024-01-27 DIAGNOSIS — K581 Irritable bowel syndrome with constipation: Secondary | ICD-10-CM | POA: Diagnosis not present

## 2024-01-27 DIAGNOSIS — C3492 Malignant neoplasm of unspecified part of left bronchus or lung: Secondary | ICD-10-CM | POA: Diagnosis present

## 2024-01-27 DIAGNOSIS — F17210 Nicotine dependence, cigarettes, uncomplicated: Secondary | ICD-10-CM | POA: Insufficient documentation

## 2024-01-27 DIAGNOSIS — E039 Hypothyroidism, unspecified: Secondary | ICD-10-CM | POA: Diagnosis not present

## 2024-01-27 DIAGNOSIS — E782 Mixed hyperlipidemia: Secondary | ICD-10-CM

## 2024-01-27 LAB — TSH+FREE T4
Free T4: 1.64 ng/dL (ref 0.82–1.77)
TSH: 1.17 u[IU]/mL (ref 0.450–4.500)

## 2024-01-27 LAB — MAGNESIUM
Magnesium: 0.3 mg/dL — CL (ref 1.6–2.3)
Magnesium: 0.6 mg/dL — CL (ref 1.7–2.4)
Magnesium: 1.3 mg/dL — ABNORMAL LOW (ref 1.7–2.4)

## 2024-01-27 LAB — CMP14+EGFR
ALT: 18 IU/L (ref 0–32)
AST: 20 IU/L (ref 0–40)
Albumin: 4.7 g/dL (ref 3.9–4.9)
Alkaline Phosphatase: 104 IU/L (ref 49–135)
BUN/Creatinine Ratio: 9 — ABNORMAL LOW (ref 12–28)
BUN: 8 mg/dL (ref 8–27)
Bilirubin Total: 0.8 mg/dL (ref 0.0–1.2)
CO2: 23 mmol/L (ref 20–29)
Calcium: 9.2 mg/dL (ref 8.7–10.3)
Chloride: 101 mmol/L (ref 96–106)
Creatinine, Ser: 0.93 mg/dL (ref 0.57–1.00)
Globulin, Total: 2.7 g/dL (ref 1.5–4.5)
Glucose: 88 mg/dL (ref 70–99)
Potassium: 3.4 mmol/L — ABNORMAL LOW (ref 3.5–5.2)
Sodium: 142 mmol/L (ref 134–144)
Total Protein: 7.4 g/dL (ref 6.0–8.5)
eGFR: 67 mL/min/1.73

## 2024-01-27 LAB — COMPREHENSIVE METABOLIC PANEL WITH GFR
ALT: 15 U/L (ref 0–44)
AST: 21 U/L (ref 15–41)
Albumin: 4.5 g/dL (ref 3.5–5.0)
Alkaline Phosphatase: 108 U/L (ref 38–126)
Anion gap: 17 — ABNORMAL HIGH (ref 5–15)
BUN: 7 mg/dL — ABNORMAL LOW (ref 8–23)
CO2: 22 mmol/L (ref 22–32)
Calcium: 7.9 mg/dL — ABNORMAL LOW (ref 8.9–10.3)
Chloride: 104 mmol/L (ref 98–111)
Creatinine, Ser: 0.86 mg/dL (ref 0.44–1.00)
GFR, Estimated: >60 mL/min
Glucose, Bld: 90 mg/dL (ref 70–99)
Potassium: 3.4 mmol/L — ABNORMAL LOW (ref 3.5–5.1)
Sodium: 142 mmol/L (ref 135–145)
Total Bilirubin: 0.5 mg/dL (ref 0.0–1.2)
Total Protein: 7.4 g/dL (ref 6.5–8.1)

## 2024-01-27 LAB — CBC WITH DIFFERENTIAL/PLATELET
Abs Immature Granulocytes: 0.05 K/uL (ref 0.00–0.07)
Basophils Absolute: 0 K/uL (ref 0.0–0.1)
Basophils Relative: 0 %
Eosinophils Absolute: 0 K/uL (ref 0.0–0.5)
Eosinophils Relative: 0 %
HCT: 38 % (ref 36.0–46.0)
Hemoglobin: 12.5 g/dL (ref 12.0–15.0)
Immature Granulocytes: 0 %
Lymphocytes Relative: 21 %
Lymphs Abs: 2.9 K/uL (ref 0.7–4.0)
MCH: 26.7 pg (ref 26.0–34.0)
MCHC: 32.9 g/dL (ref 30.0–36.0)
MCV: 81.2 fL (ref 80.0–100.0)
Monocytes Absolute: 1.4 K/uL — ABNORMAL HIGH (ref 0.1–1.0)
Monocytes Relative: 10 %
Neutro Abs: 9.4 K/uL — ABNORMAL HIGH (ref 1.7–7.7)
Neutrophils Relative %: 69 %
Platelets: 295 K/uL (ref 150–400)
RBC: 4.68 MIL/uL (ref 3.87–5.11)
RDW: 14.6 % (ref 11.5–15.5)
WBC: 13.8 K/uL — ABNORMAL HIGH (ref 4.0–10.5)
nRBC: 0 % (ref 0.0–0.2)

## 2024-01-27 LAB — LIPID PANEL
Chol/HDL Ratio: 2.4 ratio (ref 0.0–4.4)
Cholesterol, Total: 109 mg/dL (ref 100–199)
HDL: 46 mg/dL
LDL Chol Calc (NIH): 49 mg/dL (ref 0–99)
Triglycerides: 62 mg/dL (ref 0–149)
VLDL Cholesterol Cal: 14 mg/dL (ref 5–40)

## 2024-01-27 LAB — VITAMIN D 25 HYDROXY (VIT D DEFICIENCY, FRACTURES): Vit D, 25-Hydroxy: 48.7 ng/mL (ref 30.0–100.0)

## 2024-01-27 MED ORDER — CHLORHEXIDINE GLUCONATE CLOTH 2 % EX PADS
6.0000 | MEDICATED_PAD | Freq: Every day | CUTANEOUS | Status: DC
  Administered 2024-01-28: 6 via TOPICAL

## 2024-01-27 MED ORDER — ONDANSETRON HCL 4 MG/2ML IJ SOLN: 4.0000 mg | Freq: Four times a day (QID) | INTRAMUSCULAR | Status: DC | PRN

## 2024-01-27 MED ORDER — MAGNESIUM SULFATE 40 GM/1000ML IV SOLN
1.0000 g/h | INTRAVENOUS | Status: DC
  Administered 2024-01-27: 1 g/h via INTRAVENOUS
  Filled 2024-01-27: qty 1000

## 2024-01-27 MED ORDER — LACTATED RINGERS IV SOLN: INTRAVENOUS | Status: DC

## 2024-01-27 MED ORDER — MIRTAZAPINE 15 MG PO TABS
15.0000 mg | ORAL_TABLET | Freq: Every day | ORAL | Status: DC
  Administered 2024-01-27: 15 mg via ORAL
  Filled 2024-01-27: qty 1

## 2024-01-27 MED ORDER — LINACLOTIDE 145 MCG PO CAPS
145.0000 ug | ORAL_CAPSULE | Freq: Every day | ORAL | Status: DC
  Filled 2024-01-27: qty 1

## 2024-01-27 MED ORDER — LEVOTHYROXINE SODIUM 75 MCG PO TABS
75.0000 ug | ORAL_TABLET | Freq: Every day | ORAL | Status: DC
  Administered 2024-01-28: 75 ug via ORAL
  Filled 2024-01-27: qty 1

## 2024-01-27 MED ORDER — MONTELUKAST SODIUM 10 MG PO TABS
10.0000 mg | ORAL_TABLET | Freq: Every day | ORAL | Status: DC
  Administered 2024-01-27: 10 mg via ORAL
  Filled 2024-01-27: qty 1

## 2024-01-27 MED ORDER — MAGNESIUM SULFATE 2 GM/50ML IV SOLN
2.0000 g | INTRAVENOUS | Status: AC
  Administered 2024-01-27: 2 g via INTRAVENOUS
  Filled 2024-01-27: qty 50

## 2024-01-27 MED ORDER — ONDANSETRON HCL 4 MG PO TABS: 4.0000 mg | ORAL_TABLET | Freq: Four times a day (QID) | ORAL | Status: DC | PRN

## 2024-01-27 MED ORDER — ENOXAPARIN SODIUM 40 MG/0.4ML IJ SOSY
40.0000 mg | PREFILLED_SYRINGE | INTRAMUSCULAR | Status: DC
  Administered 2024-01-27: 40 mg via SUBCUTANEOUS
  Filled 2024-01-27: qty 0.4

## 2024-01-27 MED ORDER — ATORVASTATIN CALCIUM 10 MG PO TABS
10.0000 mg | ORAL_TABLET | Freq: Every day | ORAL | Status: DC
  Administered 2024-01-28: 10 mg via ORAL
  Filled 2024-01-27: qty 1

## 2024-01-27 MED ORDER — AMLODIPINE BESYLATE 5 MG PO TABS
5.0000 mg | ORAL_TABLET | Freq: Every day | ORAL | Status: DC
  Administered 2024-01-28: 5 mg via ORAL
  Filled 2024-01-27: qty 1

## 2024-01-27 MED ORDER — DULOXETINE HCL 60 MG PO CPEP
60.0000 mg | ORAL_CAPSULE | Freq: Every day | ORAL | Status: DC
  Administered 2024-01-28: 60 mg via ORAL
  Filled 2024-01-27: qty 1

## 2024-01-27 NOTE — H&P (Signed)
 " History and Physical    Patient: Kelly Douglas FMW:[id number] DOB: 1955/05/12 DOA: 01/27/2024 DOS: the patient was seen and examined on 01/27/2024 PCP: Antonetta Rollene BRAVO, MD  Patient coming from: Home  Chief Complaint:  Chief Complaint  Patient presents with   Abnormal Lab   HPI: Kelly Douglas is a 69 y.o. female with medical history significant of lung cancer, GERD, hypertension, hypothyroidism.  Patient was sent to the hospital for evaluation by PCP due to low magnesium  level.  She has had this over the last year.  She has been on magnesium  replacement and has not missed any doses.  She denies nausea, vomiting, diarrhea.  No urinary frequency.  She is no longer on her diuretic.  She reports a normal diet.  No history of abdominal bypass surgery  Review of Systems: As mentioned in the history of present illness. All other systems reviewed and are negative. Past Medical History:  Diagnosis Date   Anxiety    Arteriosclerotic cardiovascular disease (ASCVD) 2013   coronary calcification-left main, LAD and CX; small pericardial effusion   Arthritis    hips   Cancer (HCC)    Lung Cancer    Chronic back pain    Chronic bronchitis    Chronic lung disease    Chronic scarring and volume loss-left lung; characteristics of a chronic infectious process-possible MAI   Depression    Dyspnea    occasional    GERD (gastroesophageal reflux disease)    Dr Shaaron EGD 09/2009->esophagitis, sm HH, antral erosions, atonic esophagus   History of radiation therapy    Left and Right Lung- 11/24/21-12/07/21- Dr. Lynwood Nasuti   Hyperlipidemia    Hypertension    Hypothyroid 1981 approx   Scleroderma (HCC)    Seasonal allergies    Syncope    Multiple spells over the past 40+ years, likely neurocardiogenic   Tobacco abuse 06/24/2009   Past Surgical History:  Procedure Laterality Date   BILATERAL SALPINGOOPHORECTOMY  2013    Dr. Jayne; uterus remains in situ   BRONCHIAL BIOPSY  11/21/2019    Procedure: BRONCHIAL BIOPSIES;  Surgeon: Shelah Lamar RAMAN, MD;  Location: [facility] ENDOSCOPY;  Service: Pulmonary;;   BRONCHIAL BRUSHINGS  11/21/2019   Procedure: BRONCHIAL BRUSHINGS;  Surgeon: Shelah Lamar RAMAN, MD;  Location: [facility] ENDOSCOPY;  Service: Pulmonary;;   BRONCHIAL NEEDLE ASPIRATION BIOPSY  11/21/2019   Procedure: BRONCHIAL NEEDLE ASPIRATION BIOPSIES;  Surgeon: Shelah Lamar RAMAN, MD;  Location: [facility] ENDOSCOPY;  Service: Pulmonary;;   BRONCHIAL WASHINGS  11/21/2019   Procedure: BRONCHIAL WASHINGS;  Surgeon: Shelah Lamar RAMAN, MD;  Location: [facility] ENDOSCOPY;  Service: Pulmonary;;   COLONOSCOPY  09/28/09   anal papilla otherwise normal   COLONOSCOPY N/A 08/29/2012   MFM:Rnonwpr polyp-removed as described above. tubular adenoma   COLONOSCOPY N/A 11/30/2023   Procedure: COLONOSCOPY;  Surgeon: Shaaron Lamar HERO, MD;  Location: [facility] ENDO SUITE;  Service: Endoscopy;  Laterality: N/A;  10:30 am, ok rm 1   COLONOSCOPY WITH PROPOFOL  N/A 11/11/2019   Procedure: COLONOSCOPY WITH PROPOFOL ;  Surgeon: Shaaron Lamar HERO, MD;  Location: [facility] ENDO SUITE;  Service: Endoscopy;  Laterality: N/A;  2:30pm   ESOPHAGOGASTRODUODENOSCOPY  10/07/09   Dr. Verneice of esophageal mucosa, diffusely ?esophagitis (bx benign), small HH/antral erosions, erythema bx benign. 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 Hiatal hernia   FUDUCIAL PLACEMENT  10/18/2017   Procedure: PLACEMENT OF 3 FUDUCIAL INTO LEFT LOWER LOBE OF LUNG-TARGET 1;  Surgeon: Shelah Lamar RAMAN, MD;  Location: [facility] OR;  Service: Thoracic;;   LAPAROSCOPIC APPENDECTOMY  07/16/2011   Procedure: APPENDECTOMY LAPAROSCOPIC;   Surgeon: Thresa JAYSON Pulling, MD;  Location: [facility] ORS;  Service: General;  Laterality: N/A;   POLYPECTOMY  11/11/2019   Procedure: POLYPECTOMY;  Surgeon: Shaaron Lamar HERO, MD;  Location: [facility] END[facility];  Service: Endoscopy;;   TUBAL LIGATION     VIDEO BRONCHOSCOPY  10/18/2017   VIDEO BRONCHOSCOPY WITH ENDOBRONCHIAL NAVIGATION N/A 10/18/2017   Procedure: VIDEO BRONCHOSCOPY WITH ENDOBRONCHIAL NAVIGATION;  Surgeon: Shelah Lamar RAMAN, MD;  Location: [facility] OR;  Service: Thoracic;  Laterality: N/A;   VIDEO BRONCHOSCOPY WITH ENDOBRONCHIAL NAVIGATION Left 11/21/2019   Procedure: VIDEO BRONCHOSCOPY WITH ENDOBRONCHIAL NAVIGATION;  Surgeon: Shelah Lamar RAMAN, MD;  Location: [facility] ENDOSCOPY;  Service: Pulmonary;  Laterality: Left;   Social History:  reports that she has been smoking cigarettes. She started smoking about 52 years ago. She has a 26.3 pack-year smoking history. She has never used smokeless tobacco. She reports that she does not currently use alcohol after a past usage of about 1.0 standard drink of alcohol per week. She reports that she does not use drugs.  Allergies[1]  Family History  Problem Relation Age of Onset   Aneurysm Mother    Stroke Father    Aneurysm Father    Hypertension Sister    Cancer Brother        throat cancer   Coronary artery disease Brother    Hypertension Brother    Down syndrome Brother    Hypertension Brother    Hypertension Brother        father/mother   Throat cancer Brother 49       throat cancer   Diabetes Brother    Diabetes Brother    Anesthesia problems Neg Hx    Hypotension Neg Hx    Malignant hyperthermia Neg Hx    Pseudochol deficiency Neg Hx    Colon cancer Neg Hx     Prior to Admission medications  Medication Sig Start Date End Date Taking? Authorizing Provider  acetaminophen  (TYLENOL ) 650 MG CR tablet Take 1,300 mg by mouth every 8 (eight) hours as needed for pain.     [provider]  albuterol  (VENTOLIN  HFA) 108 (90 Base) MCG/ACT inhaler  Inhale 1-2 puffs into the lungs every 6 (six) hours as needed for wheezing or shortness of breath. 07/18/23   Antonetta Rollene BRAVO, MD  amLODipine  (NORVASC ) 5 MG tablet TAKE 1 TABLET EVERY DAY 12/13/23   Antonetta Rollene BRAVO, MD  atorvastatin  (LIPITOR) 10 MG tablet TAKE 1 TABLET EVERY DAY 10/30/23   Antonetta Rollene BRAVO, MD  DULoxetine  (CYMBALTA ) 60 MG capsule TAKE 1 CAPSULE EVERY DAY 09/21/23   Antonetta Rollene BRAVO, MD  ferrous sulfate  325 (65 FE) MG EC tablet Take 325 mg by mouth daily with breakfast.    [provider]  hydrochlorothiazide  (HYDRODIURIL ) 12.5 MG tablet Take 12.5 mg by mouth daily. Patient not taking: Reported on 01/24/2024 01/02/24   [provider]  levothyroxine  (SYNTHROID ) 75 MCG tablet TAKE 1 TABLET EVERY OTHER DAY ALTERNATING WITH 1/2 TABLET EVERY OTHER DAY 07/10/23   Antonetta Rollene BRAVO, MD  linaclotide  (LINZESS ) 145 MCG CAPS capsule Take 1 capsule (145 mcg total) by mouth daily before  breakfast. 10/17/23   Kennedy Charmaine CROME, NP  magnesium  oxide (MAG-OX) 400 MG tablet Take 1 tablet (400 mg total) by mouth daily. 10/26/23   Tobie Suzzane POUR, MD  meloxicam  (MOBIC ) 7.5 MG tablet TAKE 1 TABLET EVERY DAY 09/21/23   Antonetta Rollene BRAVO, MD  mirtazapine  (REMERON ) 15 MG tablet Take 1 tablet (15 mg total) by mouth at bedtime. 10/26/23   Tobie Suzzane POUR, MD  montelukast  (SINGULAIR ) 10 MG tablet TAKE 1 TABLET AT BEDTIME 12/13/23   Antonetta Rollene BRAVO, MD  Multiple Vitamin (MULTIVITAMIN WITH MINERALS) TABS tablet Take 1 tablet by mouth daily.    [provider]  Na Sulfate-K Sulfate-Mg Sulfate concentrate (SUPREP) 17.5-3.13-1.6 GM/177ML SOLN As directed Patient not taking: Reported on 01/24/2024 11/22/23   Shaaron Lamar HERO, MD  pantoprazole  (PROTONIX ) 40 MG tablet TAKE 1 TABLET TWICE DAILY Patient not taking: Reported on 01/24/2024 09/21/23   Antonetta Rollene BRAVO, MD  potassium chloride  (KLOR-CON  M) 10 MEQ tablet Take 1 tablet (10 mEq total) by mouth 2 (two) times daily. 07/18/23    Antonetta Rollene BRAVO, MD  Vonoprazan Fumarate  (VOQUEZNA ) 20 MG TABS Take 20 mg by mouth daily. 01/17/24   Kennedy Charmaine CROME, NP    Physical Exam: Vitals:   01/27/24 1507 01/27/24 1508 01/27/24 1600  BP:  126/77 122/75  Pulse:  (!) 106 94  Resp:  16 16  Temp:  98.1 F (36.7 C)   SpO2:  100% 100%  Weight: 69.9 kg    Height: 5' 8 (1.727 m)     General: Elderly female. Awake and alert and oriented x3. No acute cardiopulmonary distress.  HEENT: Normocephalic atraumatic.  Right and left ears normal in appearance.  Pupils equal, round, reactive to light. Extraocular muscles are intact. Sclerae anicteric and noninjected.  Moist mucosal membranes. No mucosal lesions.  Neck: Neck supple without lymphadenopathy. No carotid bruits. No masses palpated.  Cardiovascular: Regular rate with normal S1-S2 sounds. No murmurs, rubs, gallops auscultated. No JVD.  Respiratory: Good respiratory effort with no wheezes, rales, rhonchi. Lungs clear to auscultation bilaterally.  No accessory muscle use. Abdomen: Soft, mildly tender, nondistended. Active bowel sounds. No masses or hepatosplenomegaly  Skin: No rashes, lesions, or ulcerations.  Dry, warm to touch. 2+ dorsalis pedis and radial pulses. Musculoskeletal: No calf or leg pain. All major joints not erythematous nontender.  No upper or lower joint deformation.  Good ROM.  No contractures  Psychiatric: Intact judgment and insight. Pleasant and cooperative. Neurologic: No focal neurological deficits. Strength is 5/5 and symmetric in upper and lower extremities.  Cranial nerves II through XII are grossly intact.  Data Reviewed: Labs and imaging reviewed by me  Assessment and Plan: No notes have been filed under this hospital service. Service: Hospitalist  Principal Problem:   Hypomagnesemia Active Problems:   Arteriosclerotic cardiovascular disease (ASCVD)   Adenocarcinoma of left lung, stage 1 (HCC)  Severe hypomagnesemia Admit to stepdown on  telemetry IV magnesium  infusion continuously Check magnesium  level every 4 hours 24-hour urine magnesium  level \\May  need nephrology consult CAD History of adenocarcinoma of the left lung   Advance Care Planning:   Code Status: Full Code confirmed by patient  Consults: None  Family Communication: Son present during interview and exam  Severity of Illness: The appropriate patient status for this patient is INPATIENT. Inpatient status is judged to be reasonable and necessary in order to provide the required intensity of service to ensure the patient's safety. The patient's presenting symptoms, physical exam findings, and  initial radiographic and laboratory data in the context of their chronic comorbidities is felt to place them at high risk for further clinical deterioration. Furthermore, it is not anticipated that the patient will be medically stable for discharge from the hospital within 2 midnights of admission.   * I certify that at the point of admission it is my clinical judgment that the patient will require inpatient hospital care spanning beyond 2 midnights from the point of admission due to high intensity of service, high risk for further deterioration and high frequency of surveillance required.*  Author: Dacari Beckstrand J Ernestyne Caldwell, DO 01/27/2024 7:12 PM  For on call review [email or web address].     [1]  Allergies Allergen Reactions   Aspirin  Other (See Comments)    Nose bleeds   "

## 2024-01-27 NOTE — ED Provider Notes (Signed)
 " [place] EMERGENCY DEPARTMENT AT [facility] Provider Note   CSN: [id number] Arrival date & time: 01/27/24  1439     Patient presents with: Abnormal Lab   Kelly Douglas is a 69 y.o. female.    Abnormal Lab    This patient is a 69 year old female with a history of hypertension on amlodipine , she had previously been on hydrochlorothiazide  and according to this she may have stopped it recently.  She is on levothyroxine  pantoprazole  and has a history significant for recurrent fairly severe hypomagnesemia.  She has been admitted to the past because of this, she has been having some headaches but otherwise has not been feeling poorly, in the past when she had that she was having severe muscle cramps fatigue dizziness and was off balance.  Her family doctor drew labs a couple of days ago which showed a magnesium  level of 0.3.  Prior to Admission medications  Medication Sig Start Date End Date Taking? Authorizing Provider  acetaminophen  (TYLENOL ) 650 MG CR tablet Take 1,300 mg by mouth every 8 (eight) hours as needed for pain.     [provider]  albuterol  (VENTOLIN  HFA) 108 (90 Base) MCG/ACT inhaler Inhale 1-2 puffs into the lungs every 6 (six) hours as needed for wheezing or shortness of breath. 07/18/23   Antonetta Rollene BRAVO, MD  amLODipine  (NORVASC ) 5 MG tablet TAKE 1 TABLET EVERY DAY 12/13/23   Antonetta Rollene BRAVO, MD  atorvastatin  (LIPITOR) 10 MG tablet TAKE 1 TABLET EVERY DAY 10/30/23   Antonetta Rollene BRAVO, MD  DULoxetine  (CYMBALTA ) 60 MG capsule TAKE 1 CAPSULE EVERY DAY 09/21/23   Antonetta Rollene BRAVO, MD  ferrous sulfate  325 (65 FE) MG EC tablet Take 325 mg by mouth daily with breakfast.    [provider]  hydrochlorothiazide  (HYDRODIURIL ) 12.5 MG tablet Take 12.5 mg by mouth daily. Patient not taking: Reported on 01/24/2024 01/02/24   [provider]  levothyroxine  (SYNTHROID ) 75 MCG tablet TAKE 1 TABLET EVERY OTHER DAY ALTERNATING WITH 1/2 TABLET  EVERY OTHER DAY 07/10/23   Antonetta Rollene BRAVO, MD  linaclotide  (LINZESS ) 145 MCG CAPS capsule Take 1 capsule (145 mcg total) by mouth daily before breakfast. 10/17/23   Kennedy Charmaine CROME, NP  magnesium  oxide (MAG-OX) 400 MG tablet Take 1 tablet (400 mg total) by mouth daily. 10/26/23   Tobie Suzzane POUR, MD  meloxicam  (MOBIC ) 7.5 MG tablet TAKE 1 TABLET EVERY DAY 09/21/23   Antonetta Rollene BRAVO, MD  mirtazapine  (REMERON ) 15 MG tablet Take 1 tablet (15 mg total) by mouth at bedtime. 10/26/23   Tobie Suzzane POUR, MD  montelukast  (SINGULAIR ) 10 MG tablet TAKE 1 TABLET AT BEDTIME 12/13/23   Antonetta Rollene BRAVO, MD  Multiple Vitamin (MULTIVITAMIN WITH MINERALS) TABS tablet Take 1 tablet by mouth daily.    [provider]  Na Sulfate-K Sulfate-Mg Sulfate concentrate (SUPREP) 17.5-3.13-1.6 GM/177ML SOLN As directed Patient not taking: Reported on 01/24/2024 11/22/23   Shaaron Lamar HERO, MD  pantoprazole  (PROTONIX ) 40 MG tablet TAKE 1 TABLET TWICE DAILY Patient not taking: Reported on 01/24/2024 09/21/23   Antonetta Rollene BRAVO, MD  potassium chloride  (KLOR-CON  M) 10 MEQ tablet Take 1 tablet (10 mEq total) by mouth 2 (two) times daily. 07/18/23   Antonetta Rollene BRAVO, MD  Vonoprazan Fumarate  (VOQUEZNA ) 20 MG TABS Take 20 mg by mouth daily. 01/17/24   Kennedy Charmaine CROME, NP    Allergies: Aspirin     Review of Systems  All other systems reviewed and are  negative.   Updated Vital Signs BP 126/77 (BP Location: Right Arm)   Pulse (!) 106   Temp 98.1 F (36.7 C)   Resp 16   Ht 1.727 m (5' 8)   Wt 69.9 kg   LMP  (LMP Unknown)   SpO2 100%   BMI 23.42 kg/m   Physical Exam Vitals and nursing note reviewed.  Constitutional:      General: She is not in acute distress.    Appearance: She is well-developed.  HENT:     Head: Normocephalic and atraumatic.     Mouth/Throat:     Pharynx: No oropharyngeal exudate.  Eyes:     General: No scleral icterus.       Right eye: No discharge.        Left eye: No  discharge.     Conjunctiva/sclera: Conjunctivae normal.     Pupils: Pupils are equal, round, and reactive to light.  Neck:     Thyroid : No thyromegaly.     Vascular: No JVD.  Cardiovascular:     Rate and Rhythm: Regular rhythm. Tachycardia present.     Heart sounds: Normal heart sounds. No murmur heard.    No friction rub. No gallop.  Pulmonary:     Effort: Pulmonary effort is normal. No respiratory distress.     Breath sounds: Normal breath sounds. No wheezing or rales.  Abdominal:     General: Bowel sounds are normal. There is no distension.     Palpations: Abdomen is soft. There is no mass.     Tenderness: There is no abdominal tenderness.  Musculoskeletal:        General: No tenderness. Normal range of motion.     Cervical back: Normal range of motion and neck supple.     Right lower leg: No edema.     Left lower leg: No edema.  Lymphadenopathy:     Cervical: No cervical adenopathy.  Skin:    General: Skin is warm and dry.     Findings: No erythema or rash.  Neurological:     Mental Status: She is alert.     Coordination: Coordination normal.  Psychiatric:        Behavior: Behavior normal.     (all labs ordered are listed, but only abnormal results are displayed) Labs Reviewed  COMPREHENSIVE METABOLIC PANEL WITH GFR - Abnormal; Notable for the following components:      Result Value   Potassium 3.4 (*)    BUN 7 (*)    Calcium  7.9 (*)    Anion gap 17 (*)    All other components within normal limits  CBC WITH DIFFERENTIAL/PLATELET - Abnormal; Notable for the following components:   WBC 13.8 (*)    Neutro Abs 9.4 (*)    Monocytes Absolute 1.4 (*)    All other components within normal limits  MAGNESIUM  - Abnormal; Notable for the following components:   Magnesium  0.6 (*)    All other components within normal limits    EKG: EKG Interpretation Date/Time:  Saturday January 27 2024 15:11:53 EST Ventricular Rate:  101 PR Interval:  166 QRS Duration:  76 QT  Interval:  328 QTC Calculation: 425 R Axis:   93  Text Interpretation: Sinus tachycardia Rightward axis Anterior infarct , age undetermined Abnormal ECG When compared with ECG of 14-Oct-2023 21:17, PREVIOUS ECG IS PRESENT Since last tracing rate faster Confirmed by Cleotilde Rogue ([id number]) on 01/27/2024 3:14:49 PM  Radiology: No results found.   .Critical Care  Performed by: Cleotilde Rogue, MD Authorized by: Cleotilde Rogue, MD   Critical care provider statement:    Critical care time (minutes):  45   Critical care time was exclusive of:  Separately billable procedures and treating other patients and teaching time   Critical care was necessary to treat or prevent imminent or life-threatening deterioration of the following conditions: Severe electrolyte disturbance.   Critical care was time spent personally by me on the following activities:  Development of treatment plan with patient or surrogate, discussions with consultants, evaluation of patient's response to treatment, examination of patient, obtaining history from patient or surrogate, review of old charts, re-evaluation of patient's condition, pulse oximetry, ordering and review of radiographic studies, ordering and review of laboratory studies and ordering and performing treatments and interventions   I assumed direction of critical care for this patient from another provider in my specialty: no     Care discussed with: admitting provider   Comments:          Medications Ordered in the ED  magnesium  sulfate IVPB 2 g 50 mL (0 g Intravenous Stopped 01/27/24 1643)                                    Medical Decision Making Risk Prescription drug management. Decision regarding hospitalization.   This patient presents to the ED for concern of severely low magnesium , this involves an extensive number of treatment options, and is a complaint that carries with it a high risk of complications and morbidity.  The differential diagnosis  includes possibly diuretic use, the patient does have a history of lung cancer, I am not aware of a definite link between cancers and hypomagnesemia but certainly could have a syndrome consistent with that, could be renal in nature   Co morbidities / Chronic conditions that complicate the patient evaluation  Hypertension, lung cancer, still smokes   Additional history obtained:  Additional history obtained from EMR External records from outside source obtained and reviewed including medical record including prior admissions and office notes   Lab Tests:  I Ordered, and personally interpreted labs.  The pertinent results include: Low magnesium  at 0.6    Cardiac Monitoring: / EKG:  The patient was maintained on a cardiac monitor.  I personally viewed and interpreted the cardiac monitored which showed an underlying rhythm of: Sinus tachycardia   Problem List / ED Course / Critical interventions / Medication management  Patient was given the initial 2 g of magnesium  sulfate I ordered medication including as above Reevaluation of the patient after these medicines showed that the patient no significant change I have reviewed the patients home medicines and have made adjustments as needed   Consultations Obtained:  I requested consultation with the hospitalist Dr. Barbra,  and discussed lab and imaging findings as well as pertinent plan - they recommend: Admission to the hospital   Social Determinants of Health:  Tobacco use   Test / Admission - Considered:  Admit to hospital, critically ill with severe hypomagnesemia       Final diagnoses:  Hypomagnesemia    ED Discharge Orders     None          Cleotilde Rogue, MD 01/27/24 1752  "

## 2024-01-27 NOTE — ED Triage Notes (Signed)
 Pt reports having routine blood work yesterday and was called today about having low magnesium .  Pt has a history of same and takes a supplement.

## 2024-01-28 ENCOUNTER — Encounter: Payer: Self-pay | Admitting: Family Medicine

## 2024-01-28 DIAGNOSIS — Z23 Encounter for immunization: Secondary | ICD-10-CM | POA: Insufficient documentation

## 2024-01-28 LAB — MAGNESIUM
Magnesium: 3.5 mg/dL — ABNORMAL HIGH (ref 1.7–2.4)
Magnesium: 2.7 mg/dL — ABNORMAL HIGH (ref 1.7–2.4)

## 2024-01-28 LAB — MRSA NEXT GEN BY PCR, NASAL: MRSA by PCR Next Gen: NOT DETECTED

## 2024-01-28 MED ORDER — DULOXETINE HCL 60 MG PO CPEP: 60.0000 mg | ORAL_CAPSULE | Freq: Every day | ORAL | 3 refills | Status: AC

## 2024-01-28 MED ORDER — ALBUTEROL SULFATE HFA 108 (90 BASE) MCG/ACT IN AERS: 2.0000 | INHALATION_SPRAY | RESPIRATORY_TRACT | 3 refills | Status: AC | PRN

## 2024-01-28 MED ORDER — FERROUS SULFATE 325 (65 FE) MG PO TBEC: 325.0000 mg | DELAYED_RELEASE_TABLET | Freq: Every day | ORAL | 3 refills | Status: AC

## 2024-01-28 MED ORDER — MAGNESIUM OXIDE 400 MG PO TABS: 400.0000 mg | ORAL_TABLET | Freq: Every day | ORAL | 2 refills | Status: AC

## 2024-01-28 MED ORDER — BUDESON-GLYCOPYRROL-FORMOTEROL 160-9-4.8 MCG/ACT IN AERO: 2.0000 | INHALATION_SPRAY | Freq: Two times a day (BID) | RESPIRATORY_TRACT | Status: DC

## 2024-01-28 MED ORDER — ACETAMINOPHEN 325 MG PO TABS: 650.0000 mg | ORAL_TABLET | Freq: Four times a day (QID) | ORAL | Status: AC | PRN

## 2024-01-28 MED ORDER — ATORVASTATIN CALCIUM 10 MG PO TABS: 10.0000 mg | ORAL_TABLET | Freq: Every day | ORAL | 3 refills | Status: AC

## 2024-01-28 MED ORDER — ACETAMINOPHEN 325 MG PO TABS
650.0000 mg | ORAL_TABLET | Freq: Four times a day (QID) | ORAL | Status: DC | PRN
  Administered 2024-01-28: 650 mg via ORAL
  Filled 2024-01-28: qty 2

## 2024-01-28 MED ORDER — POTASSIUM CHLORIDE ER 20 MEQ PO TBCR: 20.0000 meq | EXTENDED_RELEASE_TABLET | Freq: Every day | ORAL | 1 refills | Status: AC

## 2024-01-28 MED ORDER — ADULT MULTIVITAMIN W/MINERALS CH: 1.0000 | ORAL_TABLET | Freq: Every day | ORAL | 3 refills | Status: AC

## 2024-01-28 NOTE — Discharge Instructions (Signed)
 1) your magnesium  and potassium are staying low because of your alcohol use 2) complete abstinence from alcohol strongly advised 3) please note that there has been several changes to your medications, please take medications as prescribed 4) please do Not take HCTZ/hydrochlorothiazide  as this can keep your magnesium  and potassium low as well

## 2024-01-28 NOTE — Assessment & Plan Note (Signed)
 Annual exam as documented. . Immunization and cancer screening needs are specifically addressed at this visit.

## 2024-01-28 NOTE — Progress Notes (Signed)
 Discharge teaching complete. Meds, diet, activity, follow up appointments reviewed and all questions answered. Copy of instructions given to patient. Patient waiting on ride to arrive.

## 2024-01-28 NOTE — Discharge Summary (Signed)
 "                                                                                 Kelly Douglas, is a 69 y.o. female  DOB October 10, 1955  MRN [id number].  Admission date:  01/27/2024  Admitting Physician  Lang JINNY Peel, DO  Discharge Date:  01/28/2024   Primary MD  Antonetta Rollene BRAVO, MD  Recommendations for primary care physician for things to follow:  1) your magnesium  and potassium are staying low because of your alcohol use 2) complete abstinence from alcohol strongly advised 3) please note that there has been several changes to your medications, please take medications as prescribed 4) please do Not take HCTZ/hydrochlorothiazide  as this can keep your magnesium  and potassium low as well  Admission Diagnosis  Hypomagnesemia [E83.42]   Discharge Diagnosis  Hypomagnesemia [E83.42]    Principal Problem:   Hypomagnesemia Active Problems:   Arteriosclerotic cardiovascular disease (ASCVD)   Adenocarcinoma of left lung, stage 1 (HCC)      Past Medical History:  Diagnosis Date   Anxiety    Arteriosclerotic cardiovascular disease (ASCVD) 2013   coronary calcification-left main, LAD and CX; small pericardial effusion   Arthritis    hips   Cancer (HCC)    Lung Cancer    Chronic back pain    Chronic bronchitis    Chronic lung disease    Chronic scarring and volume loss-left lung; characteristics of a chronic infectious process-possible MAI   Depression    Dyspnea    occasional    GERD (gastroesophageal reflux disease)    Dr Shaaron EGD 09/2009->esophagitis, sm HH, antral erosions, atonic esophagus   History of radiation therapy    Left and Right Lung- 11/24/21-12/07/21- Dr. Lynwood Nasuti   Hyperlipidemia    Hypertension    Hypothyroid 1981 approx   Scleroderma (HCC)    Seasonal allergies    Syncope    Multiple spells over the past 40+ years, likely neurocardiogenic   Tobacco abuse 06/24/2009    Past Surgical History:  Procedure Laterality Date   BILATERAL  SALPINGOOPHORECTOMY  2013    Dr. Jayne; uterus remains in situ   BRONCHIAL BIOPSY  11/21/2019   Procedure: BRONCHIAL BIOPSIES;  Surgeon: Shelah Lamar RAMAN, MD;  Location: [facility] ENDOSCOPY;  Service: Pulmonary;;   BRONCHIAL BRUSHINGS  11/21/2019   Procedure: BRONCHIAL BRUSHINGS;  Surgeon: Shelah Lamar RAMAN, MD;  Location: [facility] ENDOSCOPY;  Service: Pulmonary;;   BRONCHIAL NEEDLE ASPIRATION BIOPSY  11/21/2019   Procedure: BRONCHIAL NEEDLE ASPIRATION BIOPSIES;  Surgeon: Shelah Lamar RAMAN, MD;  Location: [facility] ENDOSCOPY;  Service: Pulmonary;;   BRONCHIAL WASHINGS  11/21/2019   Procedure: BRONCHIAL WASHINGS;  Surgeon: Shelah Lamar RAMAN, MD;  Location: [facility] ENDOSCOPY;  Service: Pulmonary;;   COLONOSCOPY  09/28/09   anal papilla otherwise normal   COLONOSCOPY N/A 08/29/2012   MFM:Rnonwpr polyp-removed as described above. tubular adenoma   COLONOSCOPY N/A 11/30/2023   Procedure: COLONOSCOPY;  Surgeon: Shaaron Lamar HERO, MD;  Location: [facility] ENDO SUITE;  Service: Endoscopy;  Laterality: N/A;  10:30 am, ok rm 1   COLONOSCOPY WITH PROPOFOL  N/A 11/11/2019   Procedure: COLONOSCOPY WITH PROPOFOL ;  Surgeon: Shaaron Lamar HERO, MD;  Location: [facility] ENDO SUITE;  Service: Endoscopy;  Laterality: N/A;  2:30pm   ESOPHAGOGASTRODUODENOSCOPY  10/07/09   Dr. Verneice of esophageal mucosa, diffusely ?esophagitis (bx benign), small HH/antral erosions, erythema bx benign. 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 Hiatal hernia   FUDUCIAL PLACEMENT  10/18/2017   Procedure: PLACEMENT OF 3 FUDUCIAL INTO LEFT LOWER LOBE OF LUNG-TARGET 1;  Surgeon: Shelah Lamar RAMAN, MD;  Location: [facility] OR;   Service: Thoracic;;   LAPAROSCOPIC APPENDECTOMY  07/16/2011   Procedure: APPENDECTOMY LAPAROSCOPIC;  Surgeon: Thresa JAYSON Pulling, MD;  Location: [facility] ORS;  Service: General;  Laterality: N/A;   POLYPECTOMY  11/11/2019   Procedure: POLYPECTOMY;  Surgeon: Shaaron Lamar HERO, MD;  Location: [facility] ENDO SUITE;  Service: Endoscopy;;   TUBAL LIGATION     VIDEO BRONCHOSCOPY  10/18/2017   VIDEO BRONCHOSCOPY WITH ENDOBRONCHIAL NAVIGATION N/A 10/18/2017   Procedure: VIDEO BRONCHOSCOPY WITH ENDOBRONCHIAL NAVIGATION;  Surgeon: Shelah Lamar RAMAN, MD;  Location: [facility] OR;  Service: Thoracic;  Laterality: N/A;   VIDEO BRONCHOSCOPY WITH ENDOBRONCHIAL NAVIGATION Left 11/21/2019   Procedure: VIDEO BRONCHOSCOPY WITH ENDOBRONCHIAL NAVIGATION;  Surgeon: Shelah Lamar RAMAN, MD;  Location: [facility] ENDOSCOPY;  Service: Pulmonary;  Laterality: Left;       HPI  from the history and physical done on the day of admission:     HPI: Kelly Douglas is a 69 y.o. female with medical history significant of lung cancer, GERD, hypertension, hypothyroidism.  Patient was sent to the hospital for evaluation by PCP due to low magnesium  level.  She has had this over the last year.  She has been on magnesium  replacement and has not missed any doses.  She denies nausea, vomiting, diarrhea.  No urinary frequency.  She is no longer on her diuretic.  She reports a normal diet.  No history of abdominal bypass surgery   Review of Systems: As mentioned in the history of present illness. All other systems reviewed and are negative.    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[employer]) - Avoid PPI - Patient reminded Not to restart HCTZ - 2)H/o HTN--- BP has been  soft - Okay to discontinue amlodipine  and hydrochlorothiazide  - 3)History of adenocarcinoma of the left lung- --continue follow-up with oncology team - Patient tells me she is unable to quit smoking  4)Depression/anxiety/insomnia--continue Remeron  and Cymbalta   5)IBS-C--continue Linzess   6)COPD/ongoing tobacco abuse -No acute COPD flareup at this time  -okay to use Breztri twice daily along with as needed albuterol  -- Patient tells me she is unable to quit smoking  7)Hypothyroidism--continue atorvastatin   Discharge Condition: stable  Follow UP   Follow-up Information     Antonetta Rollene BRAVO, MD. Schedule an appointment as soon as possible for a visit.   Specialty: Family Medicine Why: If symptoms worsen, As needed Contact information: [address] [place] [address] [phone]                Diet and Activity recommendation:  As advised  Discharge Instructions  Discharge Instructions     Call MD for:  difficulty breathing, headache or visual disturbances   Complete by: As directed    Call MD for:  persistant dizziness or light-headedness   Complete by: As directed    Call MD for:  persistant nausea and vomiting   Complete by: As directed    Call MD for:  temperature >100.4   Complete by: As directed    Diet - low sodium heart healthy   Complete by: As directed    Discharge instructions   Complete by: As directed    1) your magnesium  and potassium are staying low because of your alcohol use 2) complete abstinence from alcohol strongly advised 3) please note that there has been several changes to your medications, please take medications as prescribed 4) please do Not take HCTZ/hydrochlorothiazide  as this can keep your magnesium  and potassium low as well   Increase activity slowly   Complete by: As directed        Discharge Medications     Allergies as of 01/28/2024       Reactions   Aspirin  Other (See Comments)   Nose bleeds         Medication List     STOP taking these medications    acetaminophen  650 MG CR tablet Commonly known as: TYLENOL  Replaced by: acetaminophen  325 MG tablet   amLODipine  5 MG tablet Commonly known as: NORVASC    hydrochlorothiazide  12.5 MG tablet Commonly known as: HYDRODIURIL    pantoprazole  40 MG tablet Commonly known as: PROTONIX    potassium chloride  10 MEQ tablet Commonly known as: KLOR-CON  M       TAKE these medications    acetaminophen  325 MG tablet Commonly known as: TYLENOL  Take 2 tablets (650 mg total) by mouth every 6 (six) hours as needed for mild pain (pain score 1-3) or headache. Replaces: acetaminophen  650 MG CR tablet   albuterol  108 (90 Base) MCG/ACT inhaler Commonly known as: Ventolin  HFA Inhale 2 puffs into the lungs every 4 (four) hours as needed for wheezing or shortness of breath. What changed:  how much to take when to take this   atorvastatin  10 MG tablet Commonly known as: LIPITOR Take 1 tablet (10 mg total) by mouth daily.   DULoxetine  60 MG capsule Commonly known as: CYMBALTA  Take 1 capsule (60 mg total) by mouth daily.   ferrous sulfate  325 (65 FE) MG EC tablet Take 1 tablet (325 mg total) by mouth daily with breakfast.   levothyroxine  75 MCG tablet Commonly known as: SYNTHROID  TAKE 1 TABLET EVERY OTHER DAY ALTERNATING WITH 1/2 TABLET EVERY OTHER DAY   linaclotide  145 MCG Caps capsule Commonly known as: Linzess  Take 1 capsule (145 mcg total) by mouth daily before breakfast.   magnesium  oxide 400 MG tablet Commonly known as: MAG-OX Take 1 tablet (400 mg total) by mouth daily.   meloxicam  7.5 MG tablet Commonly known as: MOBIC  TAKE 1 TABLET EVERY DAY   mirtazapine  15 MG tablet Commonly known as: REMERON  Take 1 tablet (15 mg total) by mouth at bedtime.   montelukast  10 MG tablet Commonly known as: SINGULAIR  TAKE 1 TABLET AT BEDTIME   multivitamin with minerals Tabs tablet Take 1 tablet by mouth daily.   Na  Sulfate-K Sulfate-Mg Sulfate concentrate 17.5-3.13-1.6 GM/177ML Soln Commonly known as: SUPREP As directed   Potassium Chloride  ER 20 MEQ Tbcr Take 1 tablet (20 mEq total) by mouth daily.   Voquezna  20 MG Tabs Generic drug: Vonoprazan Fumarate  Take 20  mg by mouth daily.       Major procedures and Radiology Reports - PLEASE review detailed and final reports for all details, in brief -   CT CHEST W CONTRAST Result Date: 01/03/2024 CLINICAL DATA:  No small cell lung cancer.  * Tracking Code: BO * EXAM: CT CHEST WITH CONTRAST TECHNIQUE: Multidetector CT imaging of the chest was performed during intravenous contrast administration. RADIATION DOSE REDUCTION: This exam was performed according to the departmental dose-optimization program which includes automated exposure control, adjustment of the mA and/or kV according to patient size and/or use of iterative reconstruction technique. CONTRAST:  80mL OMNIPAQUE  IOHEXOL  300 MG/ML  SOLN COMPARISON:  07/03/2023. FINDINGS: Cardiovascular: Enlarged pulmonic trunk and heart. No pericardial effusion. Mediastinum/Nodes: No pathologically enlarged mediastinal, hilar or axillary lymph nodes. Air and food debris in a dilated esophagus. Lungs/Pleura: Centrilobular and paraseptal emphysema. Smoking related respiratory bronchiolitis likely accounts for areas of ground-glass in the upper lobes. Compensatory hypertrophy of the right lung. Scattered areas of consolidation and nodularity in the left upper lobe with index nodule in the anterior segment measuring 8 x 13 mm (5/55), stable. Left lower lobe nodule measures 1.4 cm (1.3 x 1.4 cm, 5/94), stable, with surrounding parenchymal retraction, bronchiectasis and architectural distortion. 6 mm anterior segment right upper lobe nodule (5/37), stable. No pleural fluid. Airway is unremarkable. Upper Abdomen: Gastric wall thickening. Visualized portions of the liver, gallbladder, adrenal glands, kidneys, spleen, pancreas,  stomach and bowel are otherwise grossly unremarkable. No upper abdominal adenopathy. Musculoskeletal: Degenerative changes in the spine. Osteopenia. Nondisplaced left seventh posterolateral rib fracture with an associated lucency (3/83). Lucency and irregularity involving the posterolateral left eighth rib (3/88). These are new from 07/03/2023. IMPRESSION: 1. Stable left lower lobe nodule with surrounding post radiation scarring. 2. Areas of nodular consolidation in the left upper lobe and 6 mm anterior segment right upper lobe nodule, stable. 3. Areas of focal ground-glass in the right upper lobe are new and may be due to smoking related respiratory bronchiolitis. Recommend attention on follow-up. 4. New posterolateral left seventh and eighth rib fractures, possibly treatment related. Difficult to exclude pathologic fractures. 5. Air and food debris in a dilated esophagus suggest dysmotility. Distal esophageal stricture cannot be definitively excluded. 6. Gastric wall thickening. 7. Enlarged pulmonic trunk, indicative of pulmonary arterial hypertension. 8.  Emphysema (ICD10-J43.9). Electronically Signed   By: Newell Eke M.D.   On: 01/03/2024 08:23    Micro Results  Recent Results (from the past 240 hours)  MRSA Next Gen by PCR, Nasal     Status: None   Collection Time: 01/27/24  8:09 PM   Specimen: Nasal Mucosa; Nasal Swab  Result Value Ref Range Status   MRSA by PCR Next Gen NOT DETECTED NOT DETECTED Final    Comment: (NOTE) The GeneXpert MRSA Assay (FDA approved for NASAL specimens only), is one component of a comprehensive MRSA colonization surveillance program. It is not intended to diagnose MRSA infection nor to guide or monitor treatment for MRSA infections. Test performance is not FDA approved in patients less than 70 years old. 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 rate 20, height 5' 8 (1.727 m), weight 69.9 kg, SpO2 96%.   Intake/Output Summary (Last 24 hours) at 01/28/2024 1034 Last data filed at 01/28/2024 0800 Gross per 24 hour  Intake 1058.21 ml  Output --  Net 1058.21 ml    Exam Gen:- Awake Alert, no acute distress  HEENT:- [place].AT, No sclera icterus Neck-Supple Neck,No JVD,.  Lungs-  CTAB , good air movement bilaterally CV- S1, S2 normal, regular Abd-  +ve B.Sounds, Abd Soft, No tenderness,    Extremity/Skin:- No  edema,   good pulses Psych-affect is appropriate, oriented x3 Neuro-no new focal deficits, no tremors    Data Review   CBC w Diff:  Lab Results  Component Value Date   WBC 13.8 (H) 01/27/2024   HGB 12.5 01/27/2024   HGB 13.0 09/10/2021   HCT 38.0 01/27/2024   HCT 40.4 09/10/2021   PLT 295 01/27/2024   PLT 378 09/10/2021   LYMPHOPCT 21 01/27/2024   MONOPCT 10 01/27/2024   EOSPCT 0 01/27/2024   BASOPCT 0 01/27/2024   CMP:  Lab Results  Component Value Date   NA 142 01/27/2024   NA 142 01/26/2024   K 3.4 (L) 01/27/2024   CL 104 01/27/2024   CO2 22 01/27/2024   BUN 7 (L) 01/27/2024   BUN 8 01/26/2024   CREATININE 0.86 01/27/2024   CREATININE 0.90 06/22/2016   GLU 69 05/29/2015   PROT 7.4 01/27/2024   PROT 7.4 01/26/2024   ALBUMIN 4.5 01/27/2024   ALBUMIN 4.7 01/26/2024   BILITOT 0.5 01/27/2024   BILITOT 0.8 01/26/2024   ALKPHOS 108 01/27/2024   AST 21 01/27/2024   ALT 15 01/27/2024  .  Total Discharge time is about 33 minutes  Rendall Carwin M.D on 01/28/2024 at 10:34 AM  Go to [email or web address] -  for contact info  [facility] - Office  [phone]   "

## 2024-01-28 NOTE — Progress Notes (Signed)
 Patient downgraded to OBSERVATION after admission for Hypomagnesemia. Inpatient Care Manager (ICM) conducted chart review and visited with patient at bedside to complete brief admission assessment.  Patient endorsed being fully independent and lives at home with spouse.  No discharge needs identified at this time. However, IPCM team will continue to follow along and monitor patient advancement through interdisciplinary progression rounds. If new patient transition needs arise, please enter a ICM consult to prompt IPCM team to follow up.     01/28/24 1009  TOC Brief Assessment  Insurance and Status Reviewed  Patient has primary care physician Yes  Home environment has been reviewed Home with Spouse  Prior level of function: Independent  Prior/Current Home Services No current home services  Social Drivers of Health Review SDOH reviewed no interventions necessary  Readmission risk has been reviewed Yes  Transition of care needs no transition of care needs at this time

## 2024-01-28 NOTE — Assessment & Plan Note (Signed)
 After obtaining informed consent, the  pneumonia vaccine is  administered , with no adverse effect noted at the time of administration.

## 2024-01-28 NOTE — Assessment & Plan Note (Signed)
Asked:confirms currently smokes cigarettes °Assess: Unwilling to set a quit date, but is cutting back °Advise: needs to QUIT to reduce risk of cancer, cardio and cerebrovascular disease °Assist: counseled for 5 minutes and literature provided °Arrange: follow up in 2 to 4 months ° °

## 2024-01-28 NOTE — Care Management CC44 (Signed)
"         Condition Code 44 Documentation Completed  Patient Details  Name: ARLITA BUFFKIN MRN: [id number] Date of Birth: Apr 21, 1955   Condition Code 44 given:  Yes Patient signature on Condition Code 44 notice:  Yes Documentation of 2 MD's agreement:  Yes Code 44 added to claim:  Yes    Ronnald MARLA Sil, RN 01/28/2024, 10:05 AM  "

## 2024-02-06 ENCOUNTER — Inpatient Hospital Stay: Payer: Self-pay | Admitting: Family Medicine

## 2024-02-18 ENCOUNTER — Encounter (HOSPITAL_COMMUNITY): Payer: Self-pay | Admitting: *Deleted

## 2024-02-18 ENCOUNTER — Emergency Department (HOSPITAL_COMMUNITY)

## 2024-02-18 ENCOUNTER — Other Ambulatory Visit: Payer: Self-pay

## 2024-02-18 ENCOUNTER — Emergency Department (HOSPITAL_COMMUNITY)
Admission: EM | Admit: 2024-02-18 | Discharge: 2024-02-19 | Disposition: A | Attending: Emergency Medicine | Admitting: Emergency Medicine

## 2024-02-18 DIAGNOSIS — R911 Solitary pulmonary nodule: Secondary | ICD-10-CM | POA: Insufficient documentation

## 2024-02-18 DIAGNOSIS — R112 Nausea with vomiting, unspecified: Secondary | ICD-10-CM | POA: Insufficient documentation

## 2024-02-18 DIAGNOSIS — F17200 Nicotine dependence, unspecified, uncomplicated: Secondary | ICD-10-CM | POA: Insufficient documentation

## 2024-02-18 DIAGNOSIS — D72829 Elevated white blood cell count, unspecified: Secondary | ICD-10-CM | POA: Insufficient documentation

## 2024-02-18 LAB — CBC
HCT: 39.7 % (ref 36.0–46.0)
Hemoglobin: 12.6 g/dL (ref 12.0–15.0)
MCH: 25.4 pg — ABNORMAL LOW (ref 26.0–34.0)
MCHC: 31.7 g/dL (ref 30.0–36.0)
MCV: 79.9 fL — ABNORMAL LOW (ref 80.0–100.0)
Platelets: 419 10*3/uL — ABNORMAL HIGH (ref 150–400)
RBC: 4.97 MIL/uL (ref 3.87–5.11)
RDW: 14.7 % (ref 11.5–15.5)
WBC: 13.3 10*3/uL — ABNORMAL HIGH (ref 4.0–10.5)
nRBC: 0 % (ref 0.0–0.2)

## 2024-02-18 LAB — COMPREHENSIVE METABOLIC PANEL WITH GFR
ALT: 20 U/L (ref 0–44)
AST: 24 U/L (ref 15–41)
Albumin: 4.6 g/dL (ref 3.5–5.0)
Alkaline Phosphatase: 122 U/L (ref 38–126)
Anion gap: 17 — ABNORMAL HIGH (ref 5–15)
BUN: 9 mg/dL (ref 8–23)
CO2: 29 mmol/L (ref 22–32)
Calcium: 10 mg/dL (ref 8.9–10.3)
Chloride: 98 mmol/L (ref 98–111)
Creatinine, Ser: 1.04 mg/dL — ABNORMAL HIGH (ref 0.44–1.00)
GFR, Estimated: 58 mL/min — ABNORMAL LOW
Glucose, Bld: 140 mg/dL — ABNORMAL HIGH (ref 70–99)
Potassium: 3.9 mmol/L (ref 3.5–5.1)
Sodium: 145 mmol/L (ref 135–145)
Total Bilirubin: 0.4 mg/dL (ref 0.0–1.2)
Total Protein: 8.1 g/dL (ref 6.5–8.1)

## 2024-02-18 LAB — TROPONIN T, HIGH SENSITIVITY
Troponin T High Sensitivity: 8 ng/L (ref 0–19)
Troponin T High Sensitivity: 8 ng/L (ref 0–19)

## 2024-02-18 LAB — LIPASE, BLOOD: Lipase: 11 U/L (ref 11–51)

## 2024-02-18 LAB — MAGNESIUM: Magnesium: 1.3 mg/dL — ABNORMAL LOW (ref 1.7–2.4)

## 2024-02-18 MED ORDER — ALUM & MAG HYDROXIDE-SIMETH 200-200-20 MG/5ML PO SUSP
30.0000 mL | Freq: Once | ORAL | Status: AC
  Administered 2024-02-18: 30 mL via ORAL
  Filled 2024-02-18: qty 30

## 2024-02-18 MED ORDER — LIDOCAINE VISCOUS HCL 2 % MT SOLN
15.0000 mL | Freq: Once | OROMUCOSAL | Status: AC
  Administered 2024-02-18: 15 mL via ORAL
  Filled 2024-02-18: qty 15

## 2024-02-18 MED ORDER — FAMOTIDINE IN NACL 20-0.9 MG/50ML-% IV SOLN
20.0000 mg | Freq: Once | INTRAVENOUS | Status: AC
  Administered 2024-02-18: 20 mg via INTRAVENOUS
  Filled 2024-02-18: qty 50

## 2024-02-18 MED ORDER — ACETAMINOPHEN 500 MG PO TABS
1000.0000 mg | ORAL_TABLET | Freq: Once | ORAL | Status: AC
  Administered 2024-02-18: 1000 mg via ORAL
  Filled 2024-02-18: qty 2

## 2024-02-18 MED ORDER — ALUM & MAG HYDROXIDE-SIMETH 200-200-20 MG/5ML PO SUSP: 30.0000 mL | Freq: Once | ORAL | Status: DC

## 2024-02-18 MED ORDER — MAGNESIUM SULFATE 2 GM/50ML IV SOLN
2.0000 g | Freq: Once | INTRAVENOUS | Status: AC
  Administered 2024-02-18: 2 g via INTRAVENOUS
  Filled 2024-02-18: qty 50

## 2024-02-18 MED ORDER — FENTANYL CITRATE (PF) 100 MCG/2ML IJ SOLN
50.0000 ug | Freq: Once | INTRAMUSCULAR | Status: AC
  Administered 2024-02-18: 50 ug via INTRAVENOUS
  Filled 2024-02-18: qty 2

## 2024-02-18 MED ORDER — SUCRALFATE 1 G PO TABS: 1.0000 g | ORAL_TABLET | Freq: Three times a day (TID) | ORAL | 0 refills | Status: DC

## 2024-02-18 MED ORDER — SUCRALFATE 1 GM/10ML PO SUSP
1.0000 g | Freq: Once | ORAL | Status: AC
  Administered 2024-02-18: 1 g via ORAL
  Filled 2024-02-18: qty 10

## 2024-02-18 MED ORDER — ONDANSETRON HCL 4 MG/2ML IJ SOLN
4.0000 mg | Freq: Once | INTRAMUSCULAR | Status: AC
  Administered 2024-02-18: 4 mg via INTRAVENOUS
  Filled 2024-02-18: qty 2

## 2024-02-18 NOTE — Discharge Instructions (Addendum)
 Continue your prescribed medications.  New prescriptions were also sent to your pharmacy: -Sucralfate  can be taken with meals to help give your esophagus and stomach a protective coating. -Viscous lidocaine  can be taken as needed for relief of your pain. - Zofran  can be taken for nausea.  Follow-up with Dr. Shaaron.  Return to the emergency department for any new or worsening symptoms of concern.

## 2024-02-18 NOTE — ED Notes (Signed)
 BSC placed in room- Pt stood to get on commode and became nauseous- Pt back in bed will attempt to get UA after medications are given

## 2024-02-18 NOTE — ED Notes (Signed)
 Pt c/o sore throat- new orders received.

## 2024-02-18 NOTE — ED Provider Notes (Signed)
 Care of patient assumed from Dr. Towana.  She has a history of small hiatal hernia and ulcerated esophagitis.  She underwent EGD in November.  She presented to the ED today for nausea, vomiting, and burning sensation in her throat.  She has been given GI cocktail, Carafate , fentanyl , replacement magnesium .  She will require reassessment. Physical Exam  BP (!) 176/96   Pulse 86   Temp 98 F (36.7 C)   Resp 20   Ht 5' 7 (1.702 m)   Wt 70.3 kg   LMP  (LMP Unknown)   SpO2 99%   BMI 24.28 kg/m   Physical Exam Vitals and nursing note reviewed.  Constitutional:      General: She is not in acute distress.    Appearance: Normal appearance. She is well-developed. She is not ill-appearing, toxic-appearing or diaphoretic.  HENT:     Head: Normocephalic and atraumatic.     Right Ear: External ear normal.     Left Ear: External ear normal.     Nose: Nose normal.     Mouth/Throat:     Mouth: Mucous membranes are moist.  Eyes:     Extraocular Movements: Extraocular movements intact.     Conjunctiva/sclera: Conjunctivae normal.  Cardiovascular:     Rate and Rhythm: Normal rate and regular rhythm.  Pulmonary:     Effort: Pulmonary effort is normal. No respiratory distress.  Abdominal:     General: There is no distension.     Palpations: Abdomen is soft.  Musculoskeletal:        General: No swelling.     Cervical back: Normal range of motion and neck supple.  Skin:    General: Skin is warm and dry.  Neurological:     General: No focal deficit present.     Mental Status: She is alert and oriented to person, place, and time.  Psychiatric:        Mood and Affect: Mood normal.        Behavior: Behavior normal.     Procedures  Procedures  ED Course / MDM   Clinical Course as of 02/18/24 2352  Sun Feb 18, 2024  2205 Chest x-ray with no acute.  Radiology comments upon a nodule at the base that we will need follow-up CT. [MB]  2315 Patient states her throat pain was better but now its  recurred.  Will try GI cocktail. [MB]    Clinical Course User Index [MB] Towana Ozell BROCKS, MD   Medical Decision Making Amount and/or Complexity of Data Reviewed Labs: ordered. Radiology: ordered.  Risk OTC drugs. Prescription drug management.   On assessment, patient sitting on ED stretcher.  She describes ongoing burning pain in midline chest, consistent with prior episodes of esophagitis pain.  She states that the onset of pain was yesterday.  She has had nausea and vomiting throughout the day today.  She states that she has abstained from alcohol since her last hospitalization for hypomagnesemia.  This was approximately 2 weeks ago.  IV fluids were ordered.  On reassessment, patient is sleeping.  When awakened, she states she has ongoing burning pain in her chest.  She states that she has been taking the pantoprazole .  Will prescribe Carafate  as well.  Her urinalysis did show nitrite positivity.  She does state that she has had some dysuria.  Dose of fosfomycin  was ordered.  Patient was discharged in stable condition.       Melvenia Motto, MD 02/19/24 [phone]

## 2024-02-18 NOTE — ED Provider Notes (Signed)
 " [place] [facility]MENT AT [facility] Provider Note   CSN: [id number] Arrival date & time: 02/18/24  2043     Patient presents with: Emesis   Kelly Douglas is a 69 y.o. female.  She is here with a complaint of heartburn nausea and vomiting that worsened today.  This is a chronic problem for her and she follows with GI.  She said it was worse today.  She is on a PPI but it is not helping.  GI wants to put her on another medication but it has not been approved by her insurance.   {Add pertinent medical, surgical, social history, OB history to YEP:[id number]} The history is provided by the patient and the EMS personnel.  Emesis Severity:  Moderate Duration:  1 day Timing:  Intermittent Chronicity:  Recurrent Associated symptoms: no abdominal pain and no fever        Prior to Admission medications  Medication Sig Start Date End Date Taking? Authorizing Provider  acetaminophen  (TYLENOL ) 325 MG tablet Take 2 tablets (650 mg total) by mouth every 6 (six) hours as needed for mild pain (pain score 1-3) or headache. 01/28/24   Pearlean Manus, MD  albuterol  (VENTOLIN  HFA) 108 (90 Base) MCG/ACT inhaler Inhale 2 puffs into the lungs every 4 (four) hours as needed for wheezing or shortness of breath. 01/28/24   Pearlean Manus, MD  atorvastatin  (LIPITOR) 10 MG tablet Take 1 tablet (10 mg total) by mouth daily. 01/28/24   Pearlean Manus, MD  DULoxetine  (CYMBALTA ) 60 MG capsule Take 1 capsule (60 mg total) by mouth daily. 01/28/24   Pearlean Manus, MD  ferrous sulfate  325 (65 FE) MG EC tablet Take 1 tablet (325 mg total) by mouth daily with breakfast. 01/28/24   Pearlean Manus, MD  levothyroxine  (SYNTHROID ) 75 MCG tablet TAKE 1 TABLET EVERY OTHER DAY ALTERNATING WITH 1/2 TABLET EVERY OTHER DAY 07/10/23   Antonetta Rollene BRAVO, MD  linaclotide  (LINZESS ) 145 MCG CAPS capsule Take 1 capsule (145 mcg total) by mouth daily before breakfast. 10/17/23   Kennedy Charmaine CROME, NP  magnesium   oxide (MAG-OX) 400 MG tablet Take 1 tablet (400 mg total) by mouth daily. 01/28/24   Pearlean Manus, MD  meloxicam  (MOBIC ) 7.5 MG tablet TAKE 1 TABLET EVERY DAY 09/21/23   Antonetta Rollene BRAVO, MD  mirtazapine  (REMERON ) 15 MG tablet Take 1 tablet (15 mg total) by mouth at bedtime. 10/26/23   Tobie Suzzane POUR, MD  montelukast  (SINGULAIR ) 10 MG tablet TAKE 1 TABLET AT BEDTIME 12/13/23   Antonetta Rollene BRAVO, MD  Multiple Vitamin (MULTIVITAMIN WITH MINERALS) TABS tablet Take 1 tablet by mouth daily. 01/28/24   Pearlean Manus, MD  Potassium Chloride  ER 20 MEQ TBCR Take 1 tablet (20 mEq total) by mouth daily. 01/28/24   Pearlean Manus, MD  Vonoprazan Fumarate  (VOQUEZNA ) 20 MG TABS Take 20 mg by mouth daily. 01/17/24   Kennedy Charmaine CROME, NP    Allergies: Aspirin     Review of Systems  Constitutional:  Negative for fever.  Gastrointestinal:  Positive for vomiting. Negative for abdominal pain.    Updated Vital Signs BP (!) 168/93   Pulse 81   Resp (!) 24   Ht 5' 7 (1.702 m)   Wt 70.3 kg   LMP  (LMP Unknown)   SpO2 96%   BMI 24.28 kg/m   Physical Exam Vitals and nursing note reviewed.  Constitutional:      General: She is not in acute distress.    Appearance:  Normal appearance. She is well-developed.  HENT:     Head: Normocephalic and atraumatic.  Eyes:     Conjunctiva/sclera: Conjunctivae normal.  Cardiovascular:     Rate and Rhythm: Normal rate and regular rhythm.     Heart sounds: No murmur heard. Pulmonary:     Effort: Pulmonary effort is normal. No respiratory distress.     Breath sounds: Normal breath sounds. No stridor. No wheezing.  Abdominal:     Palpations: Abdomen is soft.     Tenderness: There is no abdominal tenderness. There is no guarding or rebound.  Musculoskeletal:        General: No tenderness or deformity. Normal range of motion.     Cervical back: Neck supple.  Skin:    General: Skin is warm and dry.  Neurological:     General: No focal deficit present.      Mental Status: She is alert.     GCS: GCS eye subscore is 4. GCS verbal subscore is 5. GCS motor subscore is 6.     (all labs ordered are listed, but only abnormal results are displayed) Labs Reviewed  LIPASE, BLOOD  COMPREHENSIVE METABOLIC PANEL WITH GFR  CBC  URINALYSIS, ROUTINE W REFLEX MICROSCOPIC  MAGNESIUM   TROPONIN T, HIGH SENSITIVITY    EKG: None  Radiology: No results found.  {Document cardiac monitor, telemetry assessment procedure when appropriate:32947} Procedures   Medications Ordered in the ED  ondansetron  (ZOFRAN ) injection 4 mg (has no administration in time range)  famotidine  (PEPCID ) IVPB 20 mg premix (has no administration in time range)  sucralfate  (CARAFATE ) 1 GM/10ML suspension 1 g (has no administration in time range)  fentaNYL  (SUBLIMAZE ) injection 50 mcg (has no administration in time range)      {Click here for ABCD2, HEART and other calculators REFRESH Note before signing:1}                              Medical Decision Making Amount and/or Complexity of Data Reviewed Labs: ordered. Radiology: ordered.  Risk Prescription drug management.   This patient complains of ***; this involves an extensive number of treatment Options and is a complaint that carries with it a high risk of complications and morbidity. The differential includes ***  I ordered, reviewed and interpreted labs, which included *** I ordered medication *** and reviewed PMP when indicated. I ordered imaging studies which included *** and I independently    visualized and interpreted imaging which showed *** Additional history obtained from *** Previous records obtained and reviewed *** I consulted *** and discussed lab and imaging findings and discussed disposition.  Cardiac monitoring reviewed, *** Social determinants considered, *** Critical Interventions: ***  After the interventions stated above, I reevaluated the patient and found *** Admission and further  testing considered, ***   {Document critical care time when appropriate  Document review of labs and clinical decision tools ie CHADS2VASC2, etc  Document your independent review of radiology images and any outside records  Document your discussion with family members, caretakers and with consultants  Document social determinants of health affecting pt's care  Document your decision making why or why not admission, treatments were needed:32947:::1}   Final diagnoses:  None    ED Discharge Orders     None        "

## 2024-02-18 NOTE — ED Notes (Signed)
Unable to obtain oral or axillary temp. 

## 2024-02-19 LAB — URINALYSIS, ROUTINE W REFLEX MICROSCOPIC
Bacteria, UA: NONE SEEN
Bilirubin Urine: NEGATIVE
Glucose, UA: NEGATIVE mg/dL
Hgb urine dipstick: NEGATIVE
Ketones, ur: 5 mg/dL — AB
Nitrite: POSITIVE — AB
Protein, ur: 30 mg/dL — AB
Specific Gravity, Urine: 1.011 (ref 1.005–1.030)
pH: 9 — ABNORMAL HIGH (ref 5.0–8.0)

## 2024-02-19 MED ORDER — SUCRALFATE 1 GM/10ML PO SUSP: 1.0000 g | Freq: Three times a day (TID) | ORAL | 0 refills | Status: AC

## 2024-02-19 MED ORDER — FOSFOMYCIN TROMETHAMINE 3 G PO PACK
3.0000 g | PACK | Freq: Once | ORAL | Status: AC
  Administered 2024-02-19: 3 g via ORAL
  Filled 2024-02-19: qty 3

## 2024-02-19 MED ORDER — HYDROMORPHONE HCL 1 MG/ML IJ SOLN: 0.5000 mg | Freq: Once | INTRAMUSCULAR | Status: DC

## 2024-02-19 MED ORDER — LACTATED RINGERS IV BOLUS: 1000.0000 mL | Freq: Once | INTRAVENOUS | Status: DC

## 2024-02-19 MED ORDER — LIDOCAINE VISCOUS HCL 2 % MT SOLN: 15.0000 mL | Freq: Four times a day (QID) | OROMUCOSAL | 0 refills | Status: AC | PRN

## 2024-02-19 MED ORDER — ONDANSETRON 4 MG PO TBDP: 4.0000 mg | ORAL_TABLET | Freq: Three times a day (TID) | ORAL | 0 refills | Status: AC | PRN

## 2024-02-19 NOTE — ED Notes (Addendum)
 Pt was given water  with medications- pt has tolerated PO challenge- no vomiting- Dr Melvenia made aware.

## 2024-02-19 NOTE — ED Notes (Addendum)
 When nurse entered room to collect urine and reassess pain- pt is sleeping- Dr Melvenia made aware.

## 2024-02-21 ENCOUNTER — Ambulatory Visit: Admitting: Internal Medicine

## 2024-02-22 ENCOUNTER — Emergency Department (HOSPITAL_COMMUNITY)

## 2024-02-22 ENCOUNTER — Emergency Department (HOSPITAL_COMMUNITY)
Admission: EM | Admit: 2024-02-22 | Discharge: 2024-02-22 | Disposition: A | Discharge status: Home / Self Care | Source: Home / Self Care | Attending: Emergency Medicine | Admitting: Emergency Medicine

## 2024-02-22 ENCOUNTER — Other Ambulatory Visit: Payer: Self-pay

## 2024-02-22 ENCOUNTER — Encounter (HOSPITAL_COMMUNITY): Payer: Self-pay

## 2024-02-22 DIAGNOSIS — K529 Noninfective gastroenteritis and colitis, unspecified: Secondary | ICD-10-CM

## 2024-02-22 DIAGNOSIS — R109 Unspecified abdominal pain: Secondary | ICD-10-CM

## 2024-02-22 LAB — COMPREHENSIVE METABOLIC PANEL WITH GFR
ALT: 23 U/L (ref 0–44)
AST: 24 U/L (ref 15–41)
Albumin: 4.7 g/dL (ref 3.5–5.0)
Alkaline Phosphatase: 112 U/L (ref 38–126)
Anion gap: 13 (ref 5–15)
BUN: 20 mg/dL (ref 8–23)
CO2: 27 mmol/L (ref 22–32)
Calcium: 11 mg/dL — ABNORMAL HIGH (ref 8.9–10.3)
Chloride: 97 mmol/L — ABNORMAL LOW (ref 98–111)
Creatinine, Ser: 1.03 mg/dL — ABNORMAL HIGH (ref 0.44–1.00)
GFR, Estimated: 59 mL/min — ABNORMAL LOW
Glucose, Bld: 137 mg/dL — ABNORMAL HIGH (ref 70–99)
Potassium: 4.1 mmol/L (ref 3.5–5.1)
Sodium: 137 mmol/L (ref 135–145)
Total Bilirubin: 0.5 mg/dL (ref 0.0–1.2)
Total Protein: 8.4 g/dL — ABNORMAL HIGH (ref 6.5–8.1)

## 2024-02-22 LAB — CBC
HCT: 41.2 % (ref 36.0–46.0)
Hemoglobin: 13.2 g/dL (ref 12.0–15.0)
MCH: 25.5 pg — ABNORMAL LOW (ref 26.0–34.0)
MCHC: 32 g/dL (ref 30.0–36.0)
MCV: 79.5 fL — ABNORMAL LOW (ref 80.0–100.0)
Platelets: 406 10*3/uL — ABNORMAL HIGH (ref 150–400)
RBC: 5.18 MIL/uL — ABNORMAL HIGH (ref 3.87–5.11)
RDW: 14.3 % (ref 11.5–15.5)
WBC: 14.4 10*3/uL — ABNORMAL HIGH (ref 4.0–10.5)
nRBC: 0 % (ref 0.0–0.2)

## 2024-02-22 LAB — LIPASE, BLOOD: Lipase: 13 U/L (ref 11–51)

## 2024-02-22 MED ORDER — HYDROMORPHONE HCL 1 MG/ML IJ SOLN
1.0000 mg | Freq: Once | INTRAMUSCULAR | Status: AC
  Administered 2024-02-22: 1 mg via INTRAVENOUS
  Filled 2024-02-22: qty 1

## 2024-02-22 MED ORDER — CIPROFLOXACIN HCL 500 MG PO TABS: 500.0000 mg | ORAL_TABLET | Freq: Two times a day (BID) | ORAL | 0 refills | Status: AC

## 2024-02-22 MED ORDER — IOHEXOL 300 MG/ML  SOLN
100.0000 mL | Freq: Once | INTRAMUSCULAR | Status: AC | PRN
  Administered 2024-02-22: 100 mL via INTRAVENOUS

## 2024-02-22 MED ORDER — ALUM & MAG HYDROXIDE-SIMETH 200-200-20 MG/5ML PO SUSP
30.0000 mL | Freq: Once | ORAL | Status: AC
  Administered 2024-02-22: 30 mL via ORAL
  Filled 2024-02-22: qty 30

## 2024-02-22 MED ORDER — DICYCLOMINE HCL 10 MG PO CAPS
10.0000 mg | ORAL_CAPSULE | Freq: Once | ORAL | Status: AC
  Administered 2024-02-22: 10 mg via ORAL
  Filled 2024-02-22: qty 1

## 2024-02-22 MED ORDER — ONDANSETRON HCL 4 MG/2ML IJ SOLN
4.0000 mg | Freq: Once | INTRAMUSCULAR | Status: AC
  Administered 2024-02-22: 4 mg via INTRAVENOUS
  Filled 2024-02-22: qty 2

## 2024-02-22 MED ORDER — METRONIDAZOLE 500 MG PO TABS: 500.0000 mg | ORAL_TABLET | Freq: Four times a day (QID) | ORAL | 0 refills | Status: AC

## 2024-02-22 NOTE — Discharge Instructions (Signed)
 Make sure you take the Carafate  4 times a day for your GERD and get the prescription your  doctor wrote for you GERD.  Drink plenty of fluids.  Follow-up with your doctor next week

## 2024-02-22 NOTE — ED Provider Notes (Signed)
 " [facility] Emergency Department Provider Note MRN:  [id number]  Arrival date & time: 02/22/24     Chief Complaint   Abdominal Pain   History of Present Illness   Kelly Douglas is a 69 y.o. year-old female with a history of CAD, hypertension, scleroderma, lung cancer presenting to the ED with chief complaint of abdominal pain.  Diffuse abdominal pain worse in the lower quadrants all day today.  Nausea but no vomiting, no diarrhea constipation, no fever.  Review of Systems  A thorough review of systems was obtained and all systems are negative except as noted in the HPI and PMH.   Patient's Health History    Past Medical History:  Diagnosis Date   Anxiety    Arteriosclerotic cardiovascular disease (ASCVD) 2013   coronary calcification-left main, LAD and CX; small pericardial effusion   Arthritis    hips   Cancer (HCC)    Lung Cancer    Chronic back pain    Chronic bronchitis    Chronic lung disease    Chronic scarring and volume loss-left lung; characteristics of a chronic infectious process-possible MAI   Depression    Dyspnea    occasional    GERD (gastroesophageal reflux disease)    Dr Shaaron EGD 09/2009->esophagitis, sm HH, antral erosions, atonic esophagus   History of radiation therapy    Left and Right Lung- 11/24/21-12/07/21- Dr. Lynwood Nasuti   Hyperlipidemia    Hypertension    Hypothyroid 1981 approx   Scleroderma (HCC)    Seasonal allergies    Syncope    Multiple spells over the past 40+ years, likely neurocardiogenic   Tobacco abuse 06/24/2009    Past Surgical History:  Procedure Laterality Date   BILATERAL SALPINGOOPHORECTOMY  2013    Dr. Jayne; uterus remains in situ   BRONCHIAL BIOPSY  11/21/2019   Procedure: BRONCHIAL BIOPSIES;  Surgeon: Shelah Lamar RAMAN, MD;  Location: [facility] ENDOSCOPY;  Service: Pulmonary;;   BRONCHIAL BRUSHINGS  11/21/2019   Procedure: BRONCHIAL BRUSHINGS;  Surgeon: Shelah Lamar RAMAN, MD;  Location: [facility]  ENDOSCOPY;  Service: Pulmonary;;   BRONCHIAL NEEDLE ASPIRATION BIOPSY  11/21/2019   Procedure: BRONCHIAL NEEDLE ASPIRATION BIOPSIES;  Surgeon: Shelah Lamar RAMAN, MD;  Location: [facility] ENDOSCOPY;  Service: Pulmonary;;   BRONCHIAL WASHINGS  11/21/2019   Procedure: BRONCHIAL WASHINGS;  Surgeon: Shelah Lamar RAMAN, MD;  Location: [facility] ENDOSCOPY;  Service: Pulmonary;;   COLONOSCOPY  09/28/09   anal papilla otherwise normal   COLONOSCOPY N/A 08/29/2012   MFM:Rnonwpr polyp-removed as described above. tubular adenoma   COLONOSCOPY N/A 11/30/2023   Procedure: COLONOSCOPY;  Surgeon: Shaaron Lamar HERO, MD;  Location: [facility] ENDO SUITE;  Service: Endoscopy;  Laterality: N/A;  10:30 am, ok rm 1   COLONOSCOPY WITH PROPOFOL  N/A 11/11/2019   Procedure: COLONOSCOPY WITH PROPOFOL ;  Surgeon: Shaaron Lamar HERO, MD;  Location: [facility] ENDO [facility];  Service: Endoscopy;  Laterality: N/A;  2:30pm   ESOPHAGOGASTRODUODENOSCOPY  10/07/09   Dr. Verneice of esophageal mucosa, diffusely ?esophagitis (bx benign), small HH/antral erosions, erythema bx benign. 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 Hiatal hernia   FUDUCIAL PLACEMENT  10/18/2017   Procedure: PLACEMENT OF 3 FUDUCIAL INTO LEFT LOWER LOBE OF LUNG-TARGET 1;  Surgeon:  Shelah Lamar RAMAN, MD;  Location: [facility] OR;  Service: Thoracic;;   LAPAROSCOPIC APPENDECTOMY  07/16/2011   Procedure: APPENDECTOMY LAPAROSCOPIC;  Surgeon: Thresa JAYSON Pulling, MD;  Location: [facility] ORS;  Service: General;  Laterality: N/A;   POLYPECTOMY  11/11/2019   Procedure: POLYPECTOMY;  Surgeon: Shaaron Lamar HERO, MD;  Location: [facility] ENDO SUITE;  Service: Endoscopy;;    TUBAL LIGATION     VIDEO BRONCHOSCOPY  10/18/2017   VIDEO BRONCHOSCOPY WITH ENDOBRONCHIAL NAVIGATION N/A 10/18/2017   Procedure: VIDEO BRONCHOSCOPY WITH ENDOBRONCHIAL NAVIGATION;  Surgeon: Shelah Lamar RAMAN, MD;  Location: [facility] OR;  Service: Thoracic;  Laterality: N/A;   VIDEO BRONCHOSCOPY WITH ENDOBRONCHIAL NAVIGATION Left 11/21/2019   Procedure: VIDEO BRONCHOSCOPY WITH ENDOBRONCHIAL NAVIGATION;  Surgeon: Shelah Lamar RAMAN, MD;  Location: [facility] ENDOSCOPY;  Service: Pulmonary;  Laterality: Left;    Family History  Problem Relation Age of Onset   Aneurysm Mother    Stroke Father    Aneurysm Father    Hypertension Sister    Cancer Brother        throat cancer   Coronary artery disease Brother    Hypertension Brother    Down syndrome Brother    Hypertension Brother    Hypertension Brother        father/mother   Throat cancer Brother 49       throat cancer   Diabetes Brother    Diabetes Brother    Anesthesia problems Neg Hx    Hypotension Neg Hx    Malignant hyperthermia Neg Hx    Pseudochol deficiency Neg Hx    Colon cancer Neg Hx     Social History   Socioeconomic History   Marital status: Married    Spouse name: Not on file   Number of children: 2   Years of education: Not on file   Highest education level: Not on file  Occupational History   Occupation: disabled    Employer: NOT EMPLOYED  Tobacco Use   Smoking status: Every Day    Current packs/day: 0.50    Average packs/day: 0.5 packs/day for 52.6 years (26.3 ttl pk-yrs)    Types: Cigarettes    Start date: 07/14/1971   Smokeless tobacco: Never   Tobacco comments:    0.5 packs of cigarettes smoked daily 10/26/23  Vaping Use   Vaping status: Never Used  Substance and Sexual Activity   Alcohol use: Not Currently    Alcohol/week: 1.0 standard drink of alcohol    Types: 1 Cans of beer per week    Comment: quit June 2012-used to drink 6 pack daily, occasional beer per pt 11/20/19   Drug use: No   Sexual activity: Yes     Birth control/protection: Post-menopausal  Other Topics Concern   Not on file  Social History Narrative   Not on file   Social Drivers of Health   Tobacco Use: High Risk (02/22/2024)   Patient History    Smoking Tobacco Use: Every Day    Smokeless Tobacco Use: Never    Passive Exposure: Not on file  Financial Resource Strain: High Risk (07/12/2023)   Overall Financial Resource Strain (CARDIA)    Difficulty of Paying Living Expenses: Hard  Food Insecurity: No Food Insecurity (01/27/2024)   Epic    Worried About Radiation Protection Practitioner of Food in the Last Year: Never true    Ran Out of Food in the Last Year: Never true  Transportation Needs: No Transportation Needs (01/27/2024)   Epic    Lack of Transportation (  Medical): No    Lack of Transportation (Non-Medical): No  Physical Activity: Inactive (07/05/2023)   Exercise Vital Sign    Days of Exercise per Week: 0 days    Minutes of Exercise per Session: 0 min  Stress: No Stress Concern Present (11/08/2023)   [employer] of Occupational Health - Occupational Stress Questionnaire    Feeling of Stress: Only a little  Social Connections: Unknown (01/27/2024)   Social Connection and Isolation Panel    Frequency of Communication with Friends and Family: Patient declined    Frequency of Social Gatherings with Friends and Family: Not on file    Attends Religious Services: Patient declined    Active Member of Clubs or Organizations: Patient declined    Attends Banker Meetings: Patient declined    Marital Status: Married  Catering Manager Violence: Not At Risk (01/27/2024)   Epic    Fear of Current or Ex-Partner: No    Emotionally Abused: No    Physically Abused: No    Sexually Abused: No  Depression (PHQ2-9): Low Risk (01/24/2024)   Depression (PHQ2-9)    PHQ-2 Score: 0  Alcohol Screen: Low Risk (07/05/2023)   Alcohol Screen    Last Alcohol Screening Score (AUDIT): 0  Housing: Low Risk (01/27/2024)   Epic    Unable to Pay for  Housing in the Last Year: No    Number of Times Moved in the Last Year: 0    Homeless in the Last Year: No  Utilities: Not At Risk (01/27/2024)   Epic    Threatened with loss of utilities: No  Health Literacy: Adequate Health Literacy (07/05/2023)   B1300 Health Literacy    Frequency of need for help with medical instructions: Never     Physical Exam   Vitals:   02/22/24 0615 02/22/24 0631  BP: (!) 162/92   Pulse: 83   Resp: 18   Temp:    SpO2: 90% 90%    CONSTITUTIONAL: Chronically ill-appearing,moderate distress due to painEYES:  eyes equal and reactive ENT/NECK:  no LAD, no JVD CARDIO: Regular rate, well-perfused, normal S1 and S2 PULM:  CTAB no wheezing or rhonchi GI/GU:  non-distended, non-tender MSK/SPINE:  No gross deformities, no edema SKIN:  no rash, atraumatic   *Additional and/or pertinent findings included in MDM below  Diagnostic and Interventional Summary    EKG Interpretation Date/Time:    Ventricular Rate:    PR Interval:    QRS Duration:    QT Interval:    QTC Calculation:   R Axis:      Text Interpretation:         Labs Reviewed  CBC - Abnormal; Notable for the following components:      Result Value   WBC 14.4 (*)    RBC 5.18 (*)    MCV 79.5 (*)    MCH 25.5 (*)    Platelets 406 (*)    All other components within normal limits  COMPREHENSIVE METABOLIC PANEL WITH GFR - Abnormal; Notable for the following components:   Chloride 97 (*)    Glucose, Bld 137 (*)    Creatinine, Ser 1.03 (*)    Calcium  11.0 (*)    Total Protein 8.4 (*)    GFR, Estimated 59 (*)    All other components within normal limits  LIPASE, BLOOD  URINALYSIS, ROUTINE W REFLEX MICROSCOPIC    CT ABDOMEN PELVIS W CONTRAST    (Results Pending)    Medications  HYDROmorphone  (DILAUDID ) injection 1 mg (  1 mg Intravenous Given 02/22/24 0607)  ondansetron  (ZOFRAN ) injection 4 mg (4 mg Intravenous Given 02/22/24 0607)  iohexol  (OMNIPAQUE ) 300 MG/ML solution 100 mL (100 mLs  Intravenous Contrast Given 02/22/24 0656)     Procedures  /  Critical Care Procedures  ED Course and Medical Decision Making  Initial Impression and Ddx Differential diagnosis includes appendicitis, diverticulitis, UTI, pyelonephritis, kidney stone, perforated viscus, metastatic disease, AAA  Past medical/surgical history that increases complexity of ED encounter: CAD  Interpretation of Diagnostics I personally reviewed the Laboratory Testing and my interpretation is as follows: No significant blood count or electrolyte disturbance.  Mild leukocytosis  CT pending  Patient Reassessment and Ultimate Disposition/Management     Signed out to oncoming provider at shift change.  Patient management required discussion with the following services or consulting groups:  None  Complexity of Problems Addressed Acute illness or injury that poses threat of life of bodily function  Additional Data Reviewed and Analyzed Further history obtained from: Prior labs/imaging results  Additional Factors Impacting ED Encounter Risk Use of parenteral controlled substances and Consideration of hospitalization  Ozell HERO. Theadore, MD [facility] Emergency Medicine [facility] [email or web address]  Final Clinical Impressions(s) / ED Diagnoses     ICD-10-CM   1. Abdominal pain, unspecified abdominal location  R10.9       ED Discharge Orders     None        Discharge Instructions Discussed with and Provided to Patient:   Discharge Instructions   None      Theadore Ozell HERO, MD 02/22/24 [phone]  "

## 2024-02-22 NOTE — ED Triage Notes (Signed)
 [facility] from home. Cc of abdominal pain and heartburn and nausea  . Same issues as the other day.  Took zofran  pta

## 2024-03-04 ENCOUNTER — Ambulatory Visit: Admitting: Internal Medicine

## 2024-04-10 ENCOUNTER — Ambulatory Visit: Payer: Self-pay | Admitting: Family Medicine

## 2024-04-18 ENCOUNTER — Ambulatory Visit: Admitting: Gastroenterology

## 2024-07-08 ENCOUNTER — Ambulatory Visit

## 2024-07-09 ENCOUNTER — Inpatient Hospital Stay

## 2024-07-09 ENCOUNTER — Other Ambulatory Visit (HOSPITAL_COMMUNITY)

## 2024-07-16 ENCOUNTER — Inpatient Hospital Stay: Admitting: Physician Assistant

## 2024-07-25 ENCOUNTER — Ambulatory Visit: Payer: Self-pay | Admitting: Family Medicine

## 2024-08-12 ENCOUNTER — Ambulatory Visit (HOSPITAL_COMMUNITY)

## 2025-01-24 ENCOUNTER — Encounter: Payer: Self-pay | Admitting: Internal Medicine
